# Patient Record
Sex: Male | Born: 1940
Health system: Southern US, Community
[De-identification: ages and names within clinical notes are randomized; demographics above are authoritative.]

## PROBLEM LIST (undated history)

## (undated) DIAGNOSIS — I4891 Unspecified atrial fibrillation: Secondary | ICD-10-CM

## (undated) DIAGNOSIS — M109 Gout, unspecified: Secondary | ICD-10-CM

## (undated) DIAGNOSIS — D751 Secondary polycythemia: Secondary | ICD-10-CM

## (undated) DIAGNOSIS — E039 Hypothyroidism, unspecified: Secondary | ICD-10-CM

## (undated) DIAGNOSIS — K219 Gastro-esophageal reflux disease without esophagitis: Secondary | ICD-10-CM

## (undated) DIAGNOSIS — I10 Essential (primary) hypertension: Secondary | ICD-10-CM

## (undated) DIAGNOSIS — M1990 Unspecified osteoarthritis, unspecified site: Secondary | ICD-10-CM

## (undated) DIAGNOSIS — I2510 Atherosclerotic heart disease of native coronary artery without angina pectoris: Secondary | ICD-10-CM

## (undated) DIAGNOSIS — E079 Disorder of thyroid, unspecified: Secondary | ICD-10-CM

## (undated) DIAGNOSIS — H269 Unspecified cataract: Secondary | ICD-10-CM

## (undated) DIAGNOSIS — G4730 Sleep apnea, unspecified: Secondary | ICD-10-CM

## (undated) DIAGNOSIS — J45909 Unspecified asthma, uncomplicated: Secondary | ICD-10-CM

## (undated) DIAGNOSIS — Z95810 Presence of automatic (implantable) cardiac defibrillator: Secondary | ICD-10-CM

## (undated) DIAGNOSIS — I509 Heart failure, unspecified: Secondary | ICD-10-CM

## (undated) DIAGNOSIS — R7303 Prediabetes: Secondary | ICD-10-CM

## (undated) DIAGNOSIS — K5790 Diverticulosis of intestine, part unspecified, without perforation or abscess without bleeding: Secondary | ICD-10-CM

## (undated) DIAGNOSIS — Z950 Presence of cardiac pacemaker: Secondary | ICD-10-CM

## (undated) DIAGNOSIS — E785 Hyperlipidemia, unspecified: Secondary | ICD-10-CM

## (undated) DIAGNOSIS — J449 Chronic obstructive pulmonary disease, unspecified: Secondary | ICD-10-CM

## (undated) DIAGNOSIS — D45 Polycythemia vera: Secondary | ICD-10-CM

## (undated) DIAGNOSIS — I447 Left bundle-branch block, unspecified: Secondary | ICD-10-CM

## (undated) HISTORY — DX: Diverticulosis of intestine, part unspecified, without perforation or abscess without bleeding: K57.90

## (undated) HISTORY — DX: Hyperlipidemia, unspecified: E78.5

## (undated) HISTORY — DX: Atherosclerotic heart disease of native coronary artery without angina pectoris: I25.10

## (undated) HISTORY — DX: Heart failure, unspecified: I50.9

## (undated) HISTORY — DX: Gastro-esophageal reflux disease without esophagitis: K21.9

## (undated) HISTORY — DX: Disorder of thyroid, unspecified: E07.9

## (undated) HISTORY — PX: TONSILLECTOMY: SUR1361

## (undated) HISTORY — DX: Unspecified atrial fibrillation: I48.91

## (undated) HISTORY — DX: Prediabetes: R73.03

## (undated) HISTORY — DX: Unspecified cataract: H26.9

## (undated) HISTORY — DX: Secondary polycythemia: D75.1

## (undated) HISTORY — DX: Polycythemia vera: D45

## (undated) HISTORY — DX: Unspecified asthma, uncomplicated: J45.909

## (undated) HISTORY — DX: Essential (primary) hypertension: I10

## (undated) HISTORY — PX: OTHER SURGICAL HISTORY: SHX169

## (undated) HISTORY — PX: CATARACT EXTRACTION W/ INTRAOCULAR LENS IMPLANT: SHX1309

## (undated) HISTORY — DX: Sleep apnea, unspecified: G47.30

## (undated) HISTORY — DX: Gout, unspecified: M10.9

## (undated) HISTORY — DX: Chronic obstructive pulmonary disease, unspecified: J44.9

---

## 2006-02-19 ENCOUNTER — Encounter: Admission: RE | Admit: 2006-02-19 | Discharge: 2006-02-19 | Payer: Self-pay | Admitting: Neurosurgery

## 2006-02-27 ENCOUNTER — Observation Stay (HOSPITAL_COMMUNITY): Admission: RE | Admit: 2006-02-27 | Discharge: 2006-02-27 | Payer: Self-pay | Admitting: Neurosurgery

## 2006-11-03 ENCOUNTER — Ambulatory Visit: Payer: Self-pay | Admitting: Gastroenterology

## 2006-11-11 ENCOUNTER — Ambulatory Visit: Payer: Self-pay | Admitting: Gastroenterology

## 2006-12-02 ENCOUNTER — Ambulatory Visit: Payer: Self-pay

## 2010-10-04 NOTE — Op Note (Signed)
[name] [name], [name] [name] NO.:  [id number]   MEDICAL RECORD NO.:  [id number]          PATIENT TYPE:  INP   LOCATION:  3017                         FACILITY:  [facility]   PHYSICIAN:  Donalee Citrin, M.D.        DATE OF BIRTH:  [birth year]   DATE OF PROCEDURE:  02/27/2006  DATE OF DISCHARGE:  02/27/2006                                 OPERATIVE REPORT   PREOPERATIVE DIAGNOSIS:  Right S1 radiculopathy from ruptured disk L5-S1,  right.   PROCEDURE:  Lumbar laminectomy, microdiskectomy, L5-S1 right with  microdissection dissection of the right S1 nerve root and microscopic  diskectomy.   SURGEON:  Donalee Citrin, M.D.   ASSISTANT:  Tia Alert, MD   ANESTHESIA:  General endotracheal.   HISTORY OF PRESENT ILLNESS:  Patient a very pleasant 70 year old gentleman  who has had previous lumbar surgery many, many years ago for left leg pain.  Presents with back and right leg pain radiating through his buttock down the  posterior thigh with occasional numbness on the outside of his foot and  bottom of his foot consistent with an S1 nerve root pattern.  The patient  underwent three transforaminal S1 epidural injections with symptomatic  relief from the first two but no response from the third.  The patient has  progressive worsening back and leg pain with leg pain with leg pain much  greater than back pain with preoperative imaging showing a ruptured disk at  L5-S1, right compressing the right S1 nerve root.  The patient was  recommended laminectomy and microdiskectomy.  The risks and benefits of the  operation were explained to the patient. He understands and agreed to  proceed forward.   DESCRIPTION OF PROCEDURE:  The patient was brought to the operating room  where he was induced under general anesthesia, placed prone on the Wilson  frame.  Back was prepped and draped in the usual sterile fashion.  Preoperative x-ray localized the L5-S1 disk space.  A portion of the central  aspect of his old incision was opened up. Scar tissue was dissected free and  subperiosteal dissection carried out on the right side at L5-S1.  After  intraoperative x-ray confirmed location at appropriate level, high speed  drill was used to drill the inferior aspect lamina L5, medial facet complex  and superior aspect of S1.  Then using a 2 and 3 mm Kerrison punch, the  laminectomy was underbitten.  The facet was underbitten exposing a  hypertrophied ligament.  The ligament was then underbitten exposing the  thecal sac.  At this point the operating microscope was draped brought into  the field and under microscopic illumination the S1 pedicle was identified  and the proximal S1 nerve root.  Due to the depth, some more additional  facet was undertaken to gain retraction on the S1 nerve root.  The ruptured  disk was immediately visualized upon retraction of the S1 nerve root with a  glistening membrane overlying on top of it. Using a blunt nerve hook working  underneath this membrane, several large fragments of disk were immediately  expressed.  Then with further mobilization of the S1 nerve root and  extension of the laminotomy inferiorly, severe more large fragments of very  large disk herniation were removed from the inferior aspect of the disk  space underneath the S1 nerve root.  After these free fragments were  removed, the disk space was incised, pituitary rongeurs were used to clean  out the disk space, several large fragments were removed from the central  compartment and compressed down with Epstein curette and pituitary rongeurs  to remove them.  At the end of diskectomy there was no further stenosis.  The central canal and S1 nerve roots were explored with hockey stick and  coronary dilator and had no further stenosis.  Wound was then copiously  irrigated and meticulous hemostasis was maintained.  Gelfoam was overlaid on top of the dura.  Muscle and fascia were  reapproximated  with interrupted Vicryls and the skin was closed with running  4-0 subcuticular benzoin and Steri-Strips applied.  The patient went to  recovery room in stable condition.  At the end of the case all needle and  sponge count correct.           ______________________________  Donalee Citrin, M.D.     GC/MEDQ  D:  02/27/2006  T:  03/02/2006  Job:  [id number]

## 2011-05-20 HISTORY — PX: LUMBAR FUSION: SHX111

## 2012-05-19 DIAGNOSIS — M109 Gout, unspecified: Secondary | ICD-10-CM

## 2012-05-19 HISTORY — DX: Gout, unspecified: M10.9

## 2013-05-19 DIAGNOSIS — I2510 Atherosclerotic heart disease of native coronary artery without angina pectoris: Secondary | ICD-10-CM

## 2013-05-19 HISTORY — DX: Atherosclerotic heart disease of native coronary artery without angina pectoris: I25.10

## 2013-05-26 DIAGNOSIS — R7309 Other abnormal glucose: Secondary | ICD-10-CM | POA: Diagnosis not present

## 2013-05-26 DIAGNOSIS — E559 Vitamin D deficiency, unspecified: Secondary | ICD-10-CM | POA: Diagnosis not present

## 2013-05-26 DIAGNOSIS — Z8249 Family history of ischemic heart disease and other diseases of the circulatory system: Secondary | ICD-10-CM | POA: Diagnosis not present

## 2013-05-26 DIAGNOSIS — I10 Essential (primary) hypertension: Secondary | ICD-10-CM | POA: Diagnosis not present

## 2013-05-26 DIAGNOSIS — I428 Other cardiomyopathies: Secondary | ICD-10-CM | POA: Diagnosis not present

## 2013-05-26 DIAGNOSIS — K625 Hemorrhage of anus and rectum: Secondary | ICD-10-CM | POA: Diagnosis not present

## 2013-05-26 DIAGNOSIS — D649 Anemia, unspecified: Secondary | ICD-10-CM | POA: Diagnosis not present

## 2013-05-26 DIAGNOSIS — N400 Enlarged prostate without lower urinary tract symptoms: Secondary | ICD-10-CM | POA: Diagnosis not present

## 2013-05-26 DIAGNOSIS — E785 Hyperlipidemia, unspecified: Secondary | ICD-10-CM | POA: Diagnosis not present

## 2013-05-26 DIAGNOSIS — M109 Gout, unspecified: Secondary | ICD-10-CM | POA: Diagnosis not present

## 2013-05-26 DIAGNOSIS — Z23 Encounter for immunization: Secondary | ICD-10-CM | POA: Diagnosis not present

## 2013-05-26 DIAGNOSIS — M5137 Other intervertebral disc degeneration, lumbosacral region: Secondary | ICD-10-CM | POA: Diagnosis not present

## 2013-05-26 DIAGNOSIS — I2510 Atherosclerotic heart disease of native coronary artery without angina pectoris: Secondary | ICD-10-CM | POA: Diagnosis not present

## 2013-05-26 DIAGNOSIS — Z125 Encounter for screening for malignant neoplasm of prostate: Secondary | ICD-10-CM | POA: Diagnosis not present

## 2013-05-26 DIAGNOSIS — Z0000 Encounter for general adult medical examination without abnormal findings: Secondary | ICD-10-CM | POA: Diagnosis not present

## 2013-05-26 DIAGNOSIS — I2589 Other forms of chronic ischemic heart disease: Secondary | ICD-10-CM | POA: Diagnosis not present

## 2013-05-26 DIAGNOSIS — I421 Obstructive hypertrophic cardiomyopathy: Secondary | ICD-10-CM | POA: Diagnosis not present

## 2013-05-31 DIAGNOSIS — H43819 Vitreous degeneration, unspecified eye: Secondary | ICD-10-CM | POA: Diagnosis not present

## 2013-05-31 DIAGNOSIS — H2510 Age-related nuclear cataract, unspecified eye: Secondary | ICD-10-CM | POA: Diagnosis not present

## 2013-06-02 DIAGNOSIS — M109 Gout, unspecified: Secondary | ICD-10-CM | POA: Diagnosis not present

## 2013-06-02 DIAGNOSIS — I421 Obstructive hypertrophic cardiomyopathy: Secondary | ICD-10-CM | POA: Diagnosis not present

## 2013-06-02 DIAGNOSIS — I10 Essential (primary) hypertension: Secondary | ICD-10-CM | POA: Diagnosis not present

## 2013-06-02 DIAGNOSIS — E785 Hyperlipidemia, unspecified: Secondary | ICD-10-CM | POA: Diagnosis not present

## 2013-06-13 DIAGNOSIS — K921 Melena: Secondary | ICD-10-CM | POA: Diagnosis not present

## 2013-06-22 DIAGNOSIS — J111 Influenza due to unidentified influenza virus with other respiratory manifestations: Secondary | ICD-10-CM | POA: Diagnosis not present

## 2013-07-11 DIAGNOSIS — G4733 Obstructive sleep apnea (adult) (pediatric): Secondary | ICD-10-CM | POA: Diagnosis not present

## 2013-07-11 DIAGNOSIS — K5730 Diverticulosis of large intestine without perforation or abscess without bleeding: Secondary | ICD-10-CM | POA: Diagnosis not present

## 2013-07-11 DIAGNOSIS — E039 Hypothyroidism, unspecified: Secondary | ICD-10-CM | POA: Diagnosis not present

## 2013-07-11 DIAGNOSIS — M109 Gout, unspecified: Secondary | ICD-10-CM | POA: Diagnosis not present

## 2013-07-11 DIAGNOSIS — E7800 Pure hypercholesterolemia, unspecified: Secondary | ICD-10-CM | POA: Diagnosis not present

## 2013-07-11 DIAGNOSIS — K2289 Other specified disease of esophagus: Secondary | ICD-10-CM | POA: Diagnosis not present

## 2013-07-11 DIAGNOSIS — I10 Essential (primary) hypertension: Secondary | ICD-10-CM | POA: Diagnosis not present

## 2013-07-11 DIAGNOSIS — K921 Melena: Secondary | ICD-10-CM | POA: Diagnosis not present

## 2013-07-11 DIAGNOSIS — K5641 Fecal impaction: Secondary | ICD-10-CM | POA: Diagnosis not present

## 2013-07-11 DIAGNOSIS — K2100 Gastro-esophageal reflux disease with esophagitis, without bleeding: Secondary | ICD-10-CM | POA: Diagnosis not present

## 2013-07-11 DIAGNOSIS — K228 Other specified diseases of esophagus: Secondary | ICD-10-CM | POA: Diagnosis not present

## 2013-07-11 DIAGNOSIS — K209 Esophagitis, unspecified without bleeding: Secondary | ICD-10-CM | POA: Diagnosis not present

## 2013-07-11 DIAGNOSIS — D130 Benign neoplasm of esophagus: Secondary | ICD-10-CM | POA: Diagnosis not present

## 2013-07-11 DIAGNOSIS — D126 Benign neoplasm of colon, unspecified: Secondary | ICD-10-CM | POA: Diagnosis not present

## 2013-07-11 DIAGNOSIS — D649 Anemia, unspecified: Secondary | ICD-10-CM | POA: Diagnosis not present

## 2013-07-11 DIAGNOSIS — R1310 Dysphagia, unspecified: Secondary | ICD-10-CM | POA: Diagnosis not present

## 2013-07-11 DIAGNOSIS — K222 Esophageal obstruction: Secondary | ICD-10-CM | POA: Diagnosis not present

## 2013-07-19 DIAGNOSIS — I428 Other cardiomyopathies: Secondary | ICD-10-CM | POA: Diagnosis not present

## 2013-09-08 DIAGNOSIS — I428 Other cardiomyopathies: Secondary | ICD-10-CM | POA: Diagnosis not present

## 2013-09-08 DIAGNOSIS — I10 Essential (primary) hypertension: Secondary | ICD-10-CM | POA: Diagnosis not present

## 2013-09-08 DIAGNOSIS — R7309 Other abnormal glucose: Secondary | ICD-10-CM | POA: Diagnosis not present

## 2013-09-08 DIAGNOSIS — E785 Hyperlipidemia, unspecified: Secondary | ICD-10-CM | POA: Diagnosis not present

## 2013-11-16 HISTORY — PX: CORONARY ARTERY BYPASS GRAFT: SHX141

## 2013-12-06 DIAGNOSIS — M539 Dorsopathy, unspecified: Secondary | ICD-10-CM | POA: Diagnosis not present

## 2013-12-06 DIAGNOSIS — M5137 Other intervertebral disc degeneration, lumbosacral region: Secondary | ICD-10-CM | POA: Diagnosis not present

## 2013-12-06 DIAGNOSIS — M545 Low back pain, unspecified: Secondary | ICD-10-CM | POA: Diagnosis not present

## 2013-12-06 DIAGNOSIS — Z981 Arthrodesis status: Secondary | ICD-10-CM | POA: Diagnosis not present

## 2013-12-07 DIAGNOSIS — M545 Low back pain, unspecified: Secondary | ICD-10-CM | POA: Diagnosis not present

## 2013-12-07 DIAGNOSIS — R7309 Other abnormal glucose: Secondary | ICD-10-CM | POA: Diagnosis not present

## 2013-12-07 DIAGNOSIS — E039 Hypothyroidism, unspecified: Secondary | ICD-10-CM | POA: Diagnosis not present

## 2013-12-12 DIAGNOSIS — Z0000 Encounter for general adult medical examination without abnormal findings: Secondary | ICD-10-CM | POA: Diagnosis not present

## 2013-12-12 DIAGNOSIS — R7309 Other abnormal glucose: Secondary | ICD-10-CM | POA: Diagnosis not present

## 2013-12-12 DIAGNOSIS — M25549 Pain in joints of unspecified hand: Secondary | ICD-10-CM | POA: Diagnosis not present

## 2013-12-13 DIAGNOSIS — M545 Low back pain, unspecified: Secondary | ICD-10-CM | POA: Diagnosis not present

## 2013-12-15 DIAGNOSIS — M545 Low back pain, unspecified: Secondary | ICD-10-CM | POA: Diagnosis not present

## 2013-12-16 DIAGNOSIS — H1010 Acute atopic conjunctivitis, unspecified eye: Secondary | ICD-10-CM | POA: Diagnosis not present

## 2013-12-22 DIAGNOSIS — M545 Low back pain, unspecified: Secondary | ICD-10-CM | POA: Diagnosis not present

## 2013-12-27 DIAGNOSIS — M545 Low back pain, unspecified: Secondary | ICD-10-CM | POA: Diagnosis not present

## 2013-12-29 DIAGNOSIS — K2100 Gastro-esophageal reflux disease with esophagitis, without bleeding: Secondary | ICD-10-CM | POA: Diagnosis not present

## 2013-12-29 DIAGNOSIS — R1312 Dysphagia, oropharyngeal phase: Secondary | ICD-10-CM | POA: Diagnosis not present

## 2013-12-29 DIAGNOSIS — J342 Deviated nasal septum: Secondary | ICD-10-CM | POA: Diagnosis not present

## 2013-12-30 DIAGNOSIS — M545 Low back pain, unspecified: Secondary | ICD-10-CM | POA: Diagnosis not present

## 2014-01-03 DIAGNOSIS — M545 Low back pain, unspecified: Secondary | ICD-10-CM | POA: Diagnosis not present

## 2014-01-05 DIAGNOSIS — M545 Low back pain, unspecified: Secondary | ICD-10-CM | POA: Diagnosis not present

## 2014-01-09 DIAGNOSIS — K219 Gastro-esophageal reflux disease without esophagitis: Secondary | ICD-10-CM | POA: Diagnosis not present

## 2014-01-09 DIAGNOSIS — R1310 Dysphagia, unspecified: Secondary | ICD-10-CM | POA: Diagnosis not present

## 2014-01-10 DIAGNOSIS — M545 Low back pain, unspecified: Secondary | ICD-10-CM | POA: Diagnosis not present

## 2014-01-13 DIAGNOSIS — M545 Low back pain, unspecified: Secondary | ICD-10-CM | POA: Diagnosis not present

## 2014-01-17 DIAGNOSIS — I428 Other cardiomyopathies: Secondary | ICD-10-CM | POA: Diagnosis not present

## 2014-01-17 DIAGNOSIS — I447 Left bundle-branch block, unspecified: Secondary | ICD-10-CM | POA: Diagnosis not present

## 2014-01-17 DIAGNOSIS — R069 Unspecified abnormalities of breathing: Secondary | ICD-10-CM | POA: Diagnosis not present

## 2014-01-17 DIAGNOSIS — R079 Chest pain, unspecified: Secondary | ICD-10-CM | POA: Diagnosis not present

## 2014-01-17 DIAGNOSIS — M545 Low back pain, unspecified: Secondary | ICD-10-CM | POA: Diagnosis not present

## 2014-01-17 DIAGNOSIS — I4891 Unspecified atrial fibrillation: Secondary | ICD-10-CM

## 2014-01-17 DIAGNOSIS — R7303 Prediabetes: Secondary | ICD-10-CM

## 2014-01-17 HISTORY — DX: Prediabetes: R73.03

## 2014-01-17 HISTORY — DX: Unspecified atrial fibrillation: I48.91

## 2014-01-19 DIAGNOSIS — M5137 Other intervertebral disc degeneration, lumbosacral region: Secondary | ICD-10-CM | POA: Diagnosis not present

## 2014-01-19 DIAGNOSIS — I499 Cardiac arrhythmia, unspecified: Secondary | ICD-10-CM | POA: Diagnosis not present

## 2014-01-20 DIAGNOSIS — R0609 Other forms of dyspnea: Secondary | ICD-10-CM | POA: Diagnosis not present

## 2014-01-20 DIAGNOSIS — I428 Other cardiomyopathies: Secondary | ICD-10-CM | POA: Diagnosis not present

## 2014-01-20 DIAGNOSIS — R079 Chest pain, unspecified: Secondary | ICD-10-CM | POA: Diagnosis not present

## 2014-01-20 DIAGNOSIS — I447 Left bundle-branch block, unspecified: Secondary | ICD-10-CM | POA: Diagnosis not present

## 2014-01-20 DIAGNOSIS — R0989 Other specified symptoms and signs involving the circulatory and respiratory systems: Secondary | ICD-10-CM | POA: Diagnosis not present

## 2014-01-24 DIAGNOSIS — R079 Chest pain, unspecified: Secondary | ICD-10-CM | POA: Diagnosis not present

## 2014-01-24 DIAGNOSIS — R0609 Other forms of dyspnea: Secondary | ICD-10-CM | POA: Diagnosis not present

## 2014-01-24 DIAGNOSIS — I447 Left bundle-branch block, unspecified: Secondary | ICD-10-CM | POA: Diagnosis not present

## 2014-01-24 DIAGNOSIS — M545 Low back pain, unspecified: Secondary | ICD-10-CM | POA: Diagnosis not present

## 2014-01-24 DIAGNOSIS — R0989 Other specified symptoms and signs involving the circulatory and respiratory systems: Secondary | ICD-10-CM | POA: Diagnosis not present

## 2014-01-26 DIAGNOSIS — M545 Low back pain, unspecified: Secondary | ICD-10-CM | POA: Diagnosis not present

## 2014-01-31 DIAGNOSIS — I2510 Atherosclerotic heart disease of native coronary artery without angina pectoris: Secondary | ICD-10-CM | POA: Diagnosis not present

## 2014-02-01 DIAGNOSIS — I2510 Atherosclerotic heart disease of native coronary artery without angina pectoris: Secondary | ICD-10-CM | POA: Diagnosis not present

## 2014-02-01 DIAGNOSIS — M545 Low back pain, unspecified: Secondary | ICD-10-CM | POA: Diagnosis not present

## 2014-02-03 DIAGNOSIS — M545 Low back pain, unspecified: Secondary | ICD-10-CM | POA: Diagnosis not present

## 2014-02-06 DIAGNOSIS — I2510 Atherosclerotic heart disease of native coronary artery without angina pectoris: Secondary | ICD-10-CM | POA: Diagnosis not present

## 2014-02-06 DIAGNOSIS — I359 Nonrheumatic aortic valve disorder, unspecified: Secondary | ICD-10-CM | POA: Diagnosis not present

## 2014-02-06 DIAGNOSIS — I200 Unstable angina: Secondary | ICD-10-CM | POA: Diagnosis not present

## 2014-02-06 DIAGNOSIS — I519 Heart disease, unspecified: Secondary | ICD-10-CM | POA: Diagnosis not present

## 2014-02-06 DIAGNOSIS — I279 Pulmonary heart disease, unspecified: Secondary | ICD-10-CM | POA: Diagnosis not present

## 2014-02-06 DIAGNOSIS — Z87891 Personal history of nicotine dependence: Secondary | ICD-10-CM | POA: Diagnosis not present

## 2014-02-06 DIAGNOSIS — Q251 Coarctation of aorta: Secondary | ICD-10-CM | POA: Diagnosis not present

## 2014-02-06 DIAGNOSIS — Z79899 Other long term (current) drug therapy: Secondary | ICD-10-CM | POA: Diagnosis not present

## 2014-02-06 DIAGNOSIS — Q2529 Other atresia of aorta: Secondary | ICD-10-CM | POA: Diagnosis not present

## 2014-02-06 DIAGNOSIS — R9439 Abnormal result of other cardiovascular function study: Secondary | ICD-10-CM | POA: Diagnosis not present

## 2014-02-06 DIAGNOSIS — R0789 Other chest pain: Secondary | ICD-10-CM | POA: Diagnosis not present

## 2014-02-06 DIAGNOSIS — Z7982 Long term (current) use of aspirin: Secondary | ICD-10-CM | POA: Diagnosis not present

## 2014-02-06 DIAGNOSIS — I2789 Other specified pulmonary heart diseases: Secondary | ICD-10-CM | POA: Diagnosis not present

## 2014-02-06 DIAGNOSIS — I428 Other cardiomyopathies: Secondary | ICD-10-CM | POA: Diagnosis not present

## 2014-02-06 DIAGNOSIS — I2722 Pulmonary hypertension due to left heart disease: Secondary | ICD-10-CM | POA: Insufficient documentation

## 2014-02-08 DIAGNOSIS — I2510 Atherosclerotic heart disease of native coronary artery without angina pectoris: Secondary | ICD-10-CM | POA: Diagnosis not present

## 2014-02-08 DIAGNOSIS — I429 Cardiomyopathy, unspecified: Secondary | ICD-10-CM | POA: Insufficient documentation

## 2014-02-08 DIAGNOSIS — N390 Urinary tract infection, site not specified: Secondary | ICD-10-CM | POA: Diagnosis not present

## 2014-02-08 DIAGNOSIS — Z01810 Encounter for preprocedural cardiovascular examination: Secondary | ICD-10-CM | POA: Diagnosis not present

## 2014-02-08 DIAGNOSIS — Z7901 Long term (current) use of anticoagulants: Secondary | ICD-10-CM | POA: Diagnosis not present

## 2014-02-08 DIAGNOSIS — I10 Essential (primary) hypertension: Secondary | ICD-10-CM | POA: Diagnosis not present

## 2014-02-08 DIAGNOSIS — E079 Disorder of thyroid, unspecified: Secondary | ICD-10-CM | POA: Diagnosis not present

## 2014-02-08 DIAGNOSIS — Z01818 Encounter for other preprocedural examination: Secondary | ICD-10-CM | POA: Diagnosis not present

## 2014-02-08 DIAGNOSIS — M109 Gout, unspecified: Secondary | ICD-10-CM | POA: Insufficient documentation

## 2014-02-09 DIAGNOSIS — I10 Essential (primary) hypertension: Secondary | ICD-10-CM | POA: Diagnosis not present

## 2014-02-13 DIAGNOSIS — Z87891 Personal history of nicotine dependence: Secondary | ICD-10-CM | POA: Diagnosis not present

## 2014-02-13 DIAGNOSIS — R0602 Shortness of breath: Secondary | ICD-10-CM | POA: Diagnosis not present

## 2014-02-13 DIAGNOSIS — B961 Klebsiella pneumoniae [K. pneumoniae] as the cause of diseases classified elsewhere: Secondary | ICD-10-CM | POA: Diagnosis not present

## 2014-02-13 DIAGNOSIS — I359 Nonrheumatic aortic valve disorder, unspecified: Secondary | ICD-10-CM | POA: Diagnosis not present

## 2014-02-13 DIAGNOSIS — M109 Gout, unspecified: Secondary | ICD-10-CM | POA: Diagnosis present

## 2014-02-13 DIAGNOSIS — E871 Hypo-osmolality and hyponatremia: Secondary | ICD-10-CM | POA: Diagnosis not present

## 2014-02-13 DIAGNOSIS — Z7982 Long term (current) use of aspirin: Secondary | ICD-10-CM | POA: Diagnosis not present

## 2014-02-13 DIAGNOSIS — R1310 Dysphagia, unspecified: Secondary | ICD-10-CM | POA: Diagnosis present

## 2014-02-13 DIAGNOSIS — N390 Urinary tract infection, site not specified: Secondary | ICD-10-CM | POA: Diagnosis not present

## 2014-02-13 DIAGNOSIS — I517 Cardiomegaly: Secondary | ICD-10-CM | POA: Diagnosis not present

## 2014-02-13 DIAGNOSIS — I350 Nonrheumatic aortic (valve) stenosis: Secondary | ICD-10-CM | POA: Diagnosis present

## 2014-02-13 DIAGNOSIS — I499 Cardiac arrhythmia, unspecified: Secondary | ICD-10-CM | POA: Diagnosis not present

## 2014-02-13 DIAGNOSIS — I272 Other secondary pulmonary hypertension: Secondary | ICD-10-CM | POA: Diagnosis present

## 2014-02-13 DIAGNOSIS — I10 Essential (primary) hypertension: Secondary | ICD-10-CM | POA: Diagnosis present

## 2014-02-13 DIAGNOSIS — I5022 Chronic systolic (congestive) heart failure: Secondary | ICD-10-CM | POA: Diagnosis present

## 2014-02-13 DIAGNOSIS — J9819 Other pulmonary collapse: Secondary | ICD-10-CM | POA: Diagnosis not present

## 2014-02-13 DIAGNOSIS — Z6841 Body Mass Index (BMI) 40.0 and over, adult: Secondary | ICD-10-CM | POA: Diagnosis not present

## 2014-02-13 DIAGNOSIS — Z4682 Encounter for fitting and adjustment of non-vascular catheter: Secondary | ICD-10-CM | POA: Diagnosis not present

## 2014-02-13 DIAGNOSIS — I447 Left bundle-branch block, unspecified: Secondary | ICD-10-CM | POA: Diagnosis not present

## 2014-02-13 DIAGNOSIS — I509 Heart failure, unspecified: Secondary | ICD-10-CM | POA: Diagnosis not present

## 2014-02-13 DIAGNOSIS — R918 Other nonspecific abnormal finding of lung field: Secondary | ICD-10-CM | POA: Diagnosis not present

## 2014-02-13 DIAGNOSIS — J811 Chronic pulmonary edema: Secondary | ICD-10-CM | POA: Diagnosis not present

## 2014-02-13 DIAGNOSIS — Z981 Arthrodesis status: Secondary | ICD-10-CM | POA: Diagnosis not present

## 2014-02-13 DIAGNOSIS — D62 Acute posthemorrhagic anemia: Secondary | ICD-10-CM | POA: Diagnosis not present

## 2014-02-13 DIAGNOSIS — G4730 Sleep apnea, unspecified: Secondary | ICD-10-CM | POA: Diagnosis present

## 2014-02-13 DIAGNOSIS — N400 Enlarged prostate without lower urinary tract symptoms: Secondary | ICD-10-CM | POA: Diagnosis present

## 2014-02-13 DIAGNOSIS — E039 Hypothyroidism, unspecified: Secondary | ICD-10-CM | POA: Diagnosis present

## 2014-02-13 DIAGNOSIS — I429 Cardiomyopathy, unspecified: Secondary | ICD-10-CM | POA: Diagnosis present

## 2014-02-13 DIAGNOSIS — J90 Pleural effusion, not elsewhere classified: Secondary | ICD-10-CM | POA: Diagnosis not present

## 2014-02-13 DIAGNOSIS — Z951 Presence of aortocoronary bypass graft: Secondary | ICD-10-CM | POA: Insufficient documentation

## 2014-02-13 DIAGNOSIS — K219 Gastro-esophageal reflux disease without esophagitis: Secondary | ICD-10-CM | POA: Diagnosis present

## 2014-02-13 DIAGNOSIS — D6959 Other secondary thrombocytopenia: Secondary | ICD-10-CM | POA: Diagnosis not present

## 2014-02-13 DIAGNOSIS — J984 Other disorders of lung: Secondary | ICD-10-CM | POA: Diagnosis not present

## 2014-02-13 DIAGNOSIS — Z0389 Encounter for observation for other suspected diseases and conditions ruled out: Secondary | ICD-10-CM | POA: Diagnosis not present

## 2014-02-13 DIAGNOSIS — E785 Hyperlipidemia, unspecified: Secondary | ICD-10-CM | POA: Diagnosis present

## 2014-02-13 DIAGNOSIS — I2510 Atherosclerotic heart disease of native coronary artery without angina pectoris: Secondary | ICD-10-CM | POA: Diagnosis not present

## 2014-02-17 DIAGNOSIS — I5022 Chronic systolic (congestive) heart failure: Secondary | ICD-10-CM | POA: Insufficient documentation

## 2014-02-23 DIAGNOSIS — M109 Gout, unspecified: Secondary | ICD-10-CM | POA: Diagnosis not present

## 2014-02-23 DIAGNOSIS — N400 Enlarged prostate without lower urinary tract symptoms: Secondary | ICD-10-CM | POA: Diagnosis not present

## 2014-02-23 DIAGNOSIS — E785 Hyperlipidemia, unspecified: Secondary | ICD-10-CM | POA: Diagnosis not present

## 2014-02-23 DIAGNOSIS — I270 Primary pulmonary hypertension: Secondary | ICD-10-CM | POA: Diagnosis not present

## 2014-02-23 DIAGNOSIS — Z48812 Encounter for surgical aftercare following surgery on the circulatory system: Secondary | ICD-10-CM | POA: Diagnosis not present

## 2014-02-23 DIAGNOSIS — I447 Left bundle-branch block, unspecified: Secondary | ICD-10-CM | POA: Diagnosis not present

## 2014-02-23 DIAGNOSIS — E871 Hypo-osmolality and hyponatremia: Secondary | ICD-10-CM | POA: Diagnosis not present

## 2014-02-23 DIAGNOSIS — I5022 Chronic systolic (congestive) heart failure: Secondary | ICD-10-CM | POA: Diagnosis not present

## 2014-02-23 DIAGNOSIS — R1310 Dysphagia, unspecified: Secondary | ICD-10-CM | POA: Diagnosis not present

## 2014-02-23 DIAGNOSIS — I10 Essential (primary) hypertension: Secondary | ICD-10-CM | POA: Diagnosis not present

## 2014-02-23 DIAGNOSIS — I429 Cardiomyopathy, unspecified: Secondary | ICD-10-CM | POA: Diagnosis not present

## 2014-02-23 DIAGNOSIS — E039 Hypothyroidism, unspecified: Secondary | ICD-10-CM | POA: Diagnosis not present

## 2014-02-23 DIAGNOSIS — G4730 Sleep apnea, unspecified: Secondary | ICD-10-CM | POA: Diagnosis not present

## 2014-02-23 DIAGNOSIS — K219 Gastro-esophageal reflux disease without esophagitis: Secondary | ICD-10-CM | POA: Diagnosis not present

## 2014-02-23 DIAGNOSIS — I2510 Atherosclerotic heart disease of native coronary artery without angina pectoris: Secondary | ICD-10-CM | POA: Diagnosis not present

## 2014-02-25 DIAGNOSIS — R1310 Dysphagia, unspecified: Secondary | ICD-10-CM | POA: Diagnosis not present

## 2014-02-25 DIAGNOSIS — I447 Left bundle-branch block, unspecified: Secondary | ICD-10-CM | POA: Diagnosis not present

## 2014-02-25 DIAGNOSIS — I429 Cardiomyopathy, unspecified: Secondary | ICD-10-CM | POA: Diagnosis not present

## 2014-02-25 DIAGNOSIS — I5022 Chronic systolic (congestive) heart failure: Secondary | ICD-10-CM | POA: Diagnosis not present

## 2014-02-25 DIAGNOSIS — Z48812 Encounter for surgical aftercare following surgery on the circulatory system: Secondary | ICD-10-CM | POA: Diagnosis not present

## 2014-02-25 DIAGNOSIS — I2510 Atherosclerotic heart disease of native coronary artery without angina pectoris: Secondary | ICD-10-CM | POA: Diagnosis not present

## 2014-02-28 DIAGNOSIS — I429 Cardiomyopathy, unspecified: Secondary | ICD-10-CM | POA: Diagnosis not present

## 2014-02-28 DIAGNOSIS — I447 Left bundle-branch block, unspecified: Secondary | ICD-10-CM | POA: Diagnosis not present

## 2014-02-28 DIAGNOSIS — R1310 Dysphagia, unspecified: Secondary | ICD-10-CM | POA: Diagnosis not present

## 2014-02-28 DIAGNOSIS — I5022 Chronic systolic (congestive) heart failure: Secondary | ICD-10-CM | POA: Diagnosis not present

## 2014-02-28 DIAGNOSIS — Z48812 Encounter for surgical aftercare following surgery on the circulatory system: Secondary | ICD-10-CM | POA: Diagnosis not present

## 2014-02-28 DIAGNOSIS — I2510 Atherosclerotic heart disease of native coronary artery without angina pectoris: Secondary | ICD-10-CM | POA: Diagnosis not present

## 2014-03-01 DIAGNOSIS — I447 Left bundle-branch block, unspecified: Secondary | ICD-10-CM | POA: Diagnosis not present

## 2014-03-01 DIAGNOSIS — Z48812 Encounter for surgical aftercare following surgery on the circulatory system: Secondary | ICD-10-CM | POA: Diagnosis not present

## 2014-03-01 DIAGNOSIS — R1310 Dysphagia, unspecified: Secondary | ICD-10-CM | POA: Diagnosis not present

## 2014-03-01 DIAGNOSIS — I2510 Atherosclerotic heart disease of native coronary artery without angina pectoris: Secondary | ICD-10-CM | POA: Diagnosis not present

## 2014-03-01 DIAGNOSIS — I5022 Chronic systolic (congestive) heart failure: Secondary | ICD-10-CM | POA: Diagnosis not present

## 2014-03-01 DIAGNOSIS — I429 Cardiomyopathy, unspecified: Secondary | ICD-10-CM | POA: Diagnosis not present

## 2014-03-02 DIAGNOSIS — I429 Cardiomyopathy, unspecified: Secondary | ICD-10-CM | POA: Diagnosis not present

## 2014-03-02 DIAGNOSIS — I447 Left bundle-branch block, unspecified: Secondary | ICD-10-CM | POA: Diagnosis not present

## 2014-03-02 DIAGNOSIS — I5022 Chronic systolic (congestive) heart failure: Secondary | ICD-10-CM | POA: Diagnosis not present

## 2014-03-02 DIAGNOSIS — Z48812 Encounter for surgical aftercare following surgery on the circulatory system: Secondary | ICD-10-CM | POA: Diagnosis not present

## 2014-03-02 DIAGNOSIS — R1310 Dysphagia, unspecified: Secondary | ICD-10-CM | POA: Diagnosis not present

## 2014-03-02 DIAGNOSIS — I2510 Atherosclerotic heart disease of native coronary artery without angina pectoris: Secondary | ICD-10-CM | POA: Diagnosis not present

## 2014-03-06 DIAGNOSIS — I429 Cardiomyopathy, unspecified: Secondary | ICD-10-CM | POA: Diagnosis not present

## 2014-03-06 DIAGNOSIS — R1310 Dysphagia, unspecified: Secondary | ICD-10-CM | POA: Diagnosis not present

## 2014-03-06 DIAGNOSIS — I5022 Chronic systolic (congestive) heart failure: Secondary | ICD-10-CM | POA: Diagnosis not present

## 2014-03-06 DIAGNOSIS — I2510 Atherosclerotic heart disease of native coronary artery without angina pectoris: Secondary | ICD-10-CM | POA: Diagnosis not present

## 2014-03-06 DIAGNOSIS — Z48812 Encounter for surgical aftercare following surgery on the circulatory system: Secondary | ICD-10-CM | POA: Diagnosis not present

## 2014-03-06 DIAGNOSIS — I447 Left bundle-branch block, unspecified: Secondary | ICD-10-CM | POA: Diagnosis not present

## 2014-03-07 DIAGNOSIS — M109 Gout, unspecified: Secondary | ICD-10-CM | POA: Diagnosis not present

## 2014-03-07 DIAGNOSIS — E785 Hyperlipidemia, unspecified: Secondary | ICD-10-CM | POA: Diagnosis not present

## 2014-03-07 DIAGNOSIS — K219 Gastro-esophageal reflux disease without esophagitis: Secondary | ICD-10-CM | POA: Diagnosis not present

## 2014-03-07 DIAGNOSIS — J439 Emphysema, unspecified: Secondary | ICD-10-CM | POA: Diagnosis not present

## 2014-03-07 DIAGNOSIS — M5136 Other intervertebral disc degeneration, lumbar region: Secondary | ICD-10-CM | POA: Diagnosis not present

## 2014-03-07 DIAGNOSIS — Z23 Encounter for immunization: Secondary | ICD-10-CM | POA: Diagnosis not present

## 2014-03-07 DIAGNOSIS — N400 Enlarged prostate without lower urinary tract symptoms: Secondary | ICD-10-CM | POA: Diagnosis not present

## 2014-03-07 DIAGNOSIS — I2510 Atherosclerotic heart disease of native coronary artery without angina pectoris: Secondary | ICD-10-CM | POA: Diagnosis not present

## 2014-03-08 DIAGNOSIS — L821 Other seborrheic keratosis: Secondary | ICD-10-CM | POA: Diagnosis not present

## 2014-03-09 DIAGNOSIS — I5022 Chronic systolic (congestive) heart failure: Secondary | ICD-10-CM | POA: Diagnosis not present

## 2014-03-09 DIAGNOSIS — I2510 Atherosclerotic heart disease of native coronary artery without angina pectoris: Secondary | ICD-10-CM | POA: Diagnosis not present

## 2014-03-09 DIAGNOSIS — R1310 Dysphagia, unspecified: Secondary | ICD-10-CM | POA: Diagnosis not present

## 2014-03-09 DIAGNOSIS — I429 Cardiomyopathy, unspecified: Secondary | ICD-10-CM | POA: Diagnosis not present

## 2014-03-09 DIAGNOSIS — I447 Left bundle-branch block, unspecified: Secondary | ICD-10-CM | POA: Diagnosis not present

## 2014-03-09 DIAGNOSIS — Z48812 Encounter for surgical aftercare following surgery on the circulatory system: Secondary | ICD-10-CM | POA: Diagnosis not present

## 2014-03-10 DIAGNOSIS — E785 Hyperlipidemia, unspecified: Secondary | ICD-10-CM | POA: Diagnosis not present

## 2014-03-10 DIAGNOSIS — I25810 Atherosclerosis of coronary artery bypass graft(s) without angina pectoris: Secondary | ICD-10-CM | POA: Diagnosis not present

## 2014-03-10 DIAGNOSIS — I447 Left bundle-branch block, unspecified: Secondary | ICD-10-CM | POA: Diagnosis not present

## 2014-03-13 DIAGNOSIS — R1310 Dysphagia, unspecified: Secondary | ICD-10-CM | POA: Diagnosis not present

## 2014-03-13 DIAGNOSIS — I5022 Chronic systolic (congestive) heart failure: Secondary | ICD-10-CM | POA: Diagnosis not present

## 2014-03-13 DIAGNOSIS — I2510 Atherosclerotic heart disease of native coronary artery without angina pectoris: Secondary | ICD-10-CM | POA: Diagnosis not present

## 2014-03-13 DIAGNOSIS — I429 Cardiomyopathy, unspecified: Secondary | ICD-10-CM | POA: Diagnosis not present

## 2014-03-13 DIAGNOSIS — I447 Left bundle-branch block, unspecified: Secondary | ICD-10-CM | POA: Diagnosis not present

## 2014-03-13 DIAGNOSIS — Z48812 Encounter for surgical aftercare following surgery on the circulatory system: Secondary | ICD-10-CM | POA: Diagnosis not present

## 2014-03-14 DIAGNOSIS — I5022 Chronic systolic (congestive) heart failure: Secondary | ICD-10-CM | POA: Diagnosis not present

## 2014-03-14 DIAGNOSIS — I2510 Atherosclerotic heart disease of native coronary artery without angina pectoris: Secondary | ICD-10-CM | POA: Diagnosis not present

## 2014-03-14 DIAGNOSIS — R1310 Dysphagia, unspecified: Secondary | ICD-10-CM | POA: Diagnosis not present

## 2014-03-14 DIAGNOSIS — I447 Left bundle-branch block, unspecified: Secondary | ICD-10-CM | POA: Diagnosis not present

## 2014-03-14 DIAGNOSIS — I429 Cardiomyopathy, unspecified: Secondary | ICD-10-CM | POA: Diagnosis not present

## 2014-03-14 DIAGNOSIS — Z48812 Encounter for surgical aftercare following surgery on the circulatory system: Secondary | ICD-10-CM | POA: Diagnosis not present

## 2014-03-16 DIAGNOSIS — Z48812 Encounter for surgical aftercare following surgery on the circulatory system: Secondary | ICD-10-CM | POA: Diagnosis not present

## 2014-03-16 DIAGNOSIS — I5022 Chronic systolic (congestive) heart failure: Secondary | ICD-10-CM | POA: Diagnosis not present

## 2014-03-16 DIAGNOSIS — R1310 Dysphagia, unspecified: Secondary | ICD-10-CM | POA: Diagnosis not present

## 2014-03-16 DIAGNOSIS — I2510 Atherosclerotic heart disease of native coronary artery without angina pectoris: Secondary | ICD-10-CM | POA: Diagnosis not present

## 2014-03-16 DIAGNOSIS — I429 Cardiomyopathy, unspecified: Secondary | ICD-10-CM | POA: Diagnosis not present

## 2014-03-16 DIAGNOSIS — I447 Left bundle-branch block, unspecified: Secondary | ICD-10-CM | POA: Diagnosis not present

## 2014-03-17 DIAGNOSIS — Z48812 Encounter for surgical aftercare following surgery on the circulatory system: Secondary | ICD-10-CM | POA: Diagnosis not present

## 2014-03-17 DIAGNOSIS — I447 Left bundle-branch block, unspecified: Secondary | ICD-10-CM | POA: Diagnosis not present

## 2014-03-17 DIAGNOSIS — I5022 Chronic systolic (congestive) heart failure: Secondary | ICD-10-CM | POA: Diagnosis not present

## 2014-03-17 DIAGNOSIS — I429 Cardiomyopathy, unspecified: Secondary | ICD-10-CM | POA: Diagnosis not present

## 2014-03-17 DIAGNOSIS — I2510 Atherosclerotic heart disease of native coronary artery without angina pectoris: Secondary | ICD-10-CM | POA: Diagnosis not present

## 2014-03-17 DIAGNOSIS — R1310 Dysphagia, unspecified: Secondary | ICD-10-CM | POA: Diagnosis not present

## 2014-03-21 DIAGNOSIS — I429 Cardiomyopathy, unspecified: Secondary | ICD-10-CM | POA: Diagnosis not present

## 2014-03-21 DIAGNOSIS — Z48812 Encounter for surgical aftercare following surgery on the circulatory system: Secondary | ICD-10-CM | POA: Diagnosis not present

## 2014-03-21 DIAGNOSIS — R1310 Dysphagia, unspecified: Secondary | ICD-10-CM | POA: Diagnosis not present

## 2014-03-21 DIAGNOSIS — I2510 Atherosclerotic heart disease of native coronary artery without angina pectoris: Secondary | ICD-10-CM | POA: Diagnosis not present

## 2014-03-21 DIAGNOSIS — I447 Left bundle-branch block, unspecified: Secondary | ICD-10-CM | POA: Diagnosis not present

## 2014-03-21 DIAGNOSIS — I5022 Chronic systolic (congestive) heart failure: Secondary | ICD-10-CM | POA: Diagnosis not present

## 2014-03-24 DIAGNOSIS — I2510 Atherosclerotic heart disease of native coronary artery without angina pectoris: Secondary | ICD-10-CM | POA: Diagnosis not present

## 2014-03-24 DIAGNOSIS — I499 Cardiac arrhythmia, unspecified: Secondary | ICD-10-CM | POA: Diagnosis not present

## 2014-03-24 DIAGNOSIS — I509 Heart failure, unspecified: Secondary | ICD-10-CM | POA: Diagnosis not present

## 2014-03-24 DIAGNOSIS — I421 Obstructive hypertrophic cardiomyopathy: Secondary | ICD-10-CM | POA: Diagnosis not present

## 2014-03-24 DIAGNOSIS — I429 Cardiomyopathy, unspecified: Secondary | ICD-10-CM | POA: Diagnosis not present

## 2014-03-24 DIAGNOSIS — I5022 Chronic systolic (congestive) heart failure: Secondary | ICD-10-CM | POA: Diagnosis not present

## 2014-03-24 DIAGNOSIS — I447 Left bundle-branch block, unspecified: Secondary | ICD-10-CM | POA: Diagnosis not present

## 2014-03-24 DIAGNOSIS — I10 Essential (primary) hypertension: Secondary | ICD-10-CM | POA: Diagnosis not present

## 2014-03-24 DIAGNOSIS — R1310 Dysphagia, unspecified: Secondary | ICD-10-CM | POA: Diagnosis not present

## 2014-03-24 DIAGNOSIS — R7309 Other abnormal glucose: Secondary | ICD-10-CM | POA: Diagnosis not present

## 2014-03-24 DIAGNOSIS — E785 Hyperlipidemia, unspecified: Secondary | ICD-10-CM | POA: Diagnosis not present

## 2014-03-24 DIAGNOSIS — Z48812 Encounter for surgical aftercare following surgery on the circulatory system: Secondary | ICD-10-CM | POA: Diagnosis not present

## 2014-03-31 DIAGNOSIS — M7989 Other specified soft tissue disorders: Secondary | ICD-10-CM | POA: Diagnosis not present

## 2014-03-31 DIAGNOSIS — S83241A Other tear of medial meniscus, current injury, right knee, initial encounter: Secondary | ICD-10-CM | POA: Diagnosis not present

## 2014-04-05 DIAGNOSIS — I5022 Chronic systolic (congestive) heart failure: Secondary | ICD-10-CM | POA: Diagnosis not present

## 2014-04-05 DIAGNOSIS — R1310 Dysphagia, unspecified: Secondary | ICD-10-CM | POA: Diagnosis not present

## 2014-04-05 DIAGNOSIS — I447 Left bundle-branch block, unspecified: Secondary | ICD-10-CM | POA: Diagnosis not present

## 2014-04-05 DIAGNOSIS — Z48812 Encounter for surgical aftercare following surgery on the circulatory system: Secondary | ICD-10-CM | POA: Diagnosis not present

## 2014-04-05 DIAGNOSIS — I2510 Atherosclerotic heart disease of native coronary artery without angina pectoris: Secondary | ICD-10-CM | POA: Diagnosis not present

## 2014-04-05 DIAGNOSIS — I429 Cardiomyopathy, unspecified: Secondary | ICD-10-CM | POA: Diagnosis not present

## 2014-04-10 DIAGNOSIS — Z48812 Encounter for surgical aftercare following surgery on the circulatory system: Secondary | ICD-10-CM | POA: Diagnosis not present

## 2014-04-10 DIAGNOSIS — R1310 Dysphagia, unspecified: Secondary | ICD-10-CM | POA: Diagnosis not present

## 2014-04-10 DIAGNOSIS — I5022 Chronic systolic (congestive) heart failure: Secondary | ICD-10-CM | POA: Diagnosis not present

## 2014-04-10 DIAGNOSIS — I429 Cardiomyopathy, unspecified: Secondary | ICD-10-CM | POA: Diagnosis not present

## 2014-04-10 DIAGNOSIS — I2510 Atherosclerotic heart disease of native coronary artery without angina pectoris: Secondary | ICD-10-CM | POA: Diagnosis not present

## 2014-04-10 DIAGNOSIS — I447 Left bundle-branch block, unspecified: Secondary | ICD-10-CM | POA: Diagnosis not present

## 2014-04-21 DIAGNOSIS — R079 Chest pain, unspecified: Secondary | ICD-10-CM | POA: Diagnosis not present

## 2014-04-21 DIAGNOSIS — I259 Chronic ischemic heart disease, unspecified: Secondary | ICD-10-CM | POA: Diagnosis not present

## 2014-04-21 DIAGNOSIS — E785 Hyperlipidemia, unspecified: Secondary | ICD-10-CM | POA: Diagnosis not present

## 2014-05-03 DIAGNOSIS — I2510 Atherosclerotic heart disease of native coronary artery without angina pectoris: Secondary | ICD-10-CM | POA: Diagnosis not present

## 2014-05-03 DIAGNOSIS — E785 Hyperlipidemia, unspecified: Secondary | ICD-10-CM | POA: Diagnosis not present

## 2014-05-03 DIAGNOSIS — R079 Chest pain, unspecified: Secondary | ICD-10-CM | POA: Diagnosis not present

## 2014-05-05 DIAGNOSIS — R079 Chest pain, unspecified: Secondary | ICD-10-CM | POA: Diagnosis not present

## 2014-05-05 DIAGNOSIS — E785 Hyperlipidemia, unspecified: Secondary | ICD-10-CM | POA: Diagnosis not present

## 2014-05-05 DIAGNOSIS — I2510 Atherosclerotic heart disease of native coronary artery without angina pectoris: Secondary | ICD-10-CM | POA: Diagnosis not present

## 2014-05-23 DIAGNOSIS — E6609 Other obesity due to excess calories: Secondary | ICD-10-CM | POA: Diagnosis not present

## 2014-05-23 DIAGNOSIS — N400 Enlarged prostate without lower urinary tract symptoms: Secondary | ICD-10-CM | POA: Diagnosis not present

## 2014-05-23 DIAGNOSIS — I429 Cardiomyopathy, unspecified: Secondary | ICD-10-CM | POA: Diagnosis not present

## 2014-05-23 DIAGNOSIS — M159 Polyosteoarthritis, unspecified: Secondary | ICD-10-CM | POA: Diagnosis not present

## 2014-05-23 DIAGNOSIS — I10 Essential (primary) hypertension: Secondary | ICD-10-CM | POA: Diagnosis not present

## 2014-05-23 DIAGNOSIS — K219 Gastro-esophageal reflux disease without esophagitis: Secondary | ICD-10-CM | POA: Diagnosis not present

## 2014-05-23 DIAGNOSIS — E785 Hyperlipidemia, unspecified: Secondary | ICD-10-CM | POA: Diagnosis not present

## 2014-05-23 DIAGNOSIS — M5136 Other intervertebral disc degeneration, lumbar region: Secondary | ICD-10-CM | POA: Diagnosis not present

## 2014-05-23 DIAGNOSIS — J439 Emphysema, unspecified: Secondary | ICD-10-CM | POA: Diagnosis not present

## 2014-05-23 DIAGNOSIS — I2510 Atherosclerotic heart disease of native coronary artery without angina pectoris: Secondary | ICD-10-CM | POA: Diagnosis not present

## 2014-05-26 DIAGNOSIS — E785 Hyperlipidemia, unspecified: Secondary | ICD-10-CM | POA: Diagnosis not present

## 2014-05-26 DIAGNOSIS — I259 Chronic ischemic heart disease, unspecified: Secondary | ICD-10-CM | POA: Diagnosis not present

## 2014-05-26 DIAGNOSIS — I420 Dilated cardiomyopathy: Secondary | ICD-10-CM | POA: Diagnosis not present

## 2014-05-26 DIAGNOSIS — I447 Left bundle-branch block, unspecified: Secondary | ICD-10-CM | POA: Diagnosis not present

## 2014-06-01 DIAGNOSIS — Z961 Presence of intraocular lens: Secondary | ICD-10-CM | POA: Diagnosis not present

## 2014-06-01 DIAGNOSIS — H2511 Age-related nuclear cataract, right eye: Secondary | ICD-10-CM | POA: Diagnosis not present

## 2014-06-01 DIAGNOSIS — H43813 Vitreous degeneration, bilateral: Secondary | ICD-10-CM | POA: Diagnosis not present

## 2014-06-01 DIAGNOSIS — H04123 Dry eye syndrome of bilateral lacrimal glands: Secondary | ICD-10-CM | POA: Diagnosis not present

## 2014-06-07 DIAGNOSIS — I420 Dilated cardiomyopathy: Secondary | ICD-10-CM | POA: Diagnosis not present

## 2014-06-07 DIAGNOSIS — I259 Chronic ischemic heart disease, unspecified: Secondary | ICD-10-CM | POA: Diagnosis not present

## 2014-06-20 DIAGNOSIS — I481 Persistent atrial fibrillation: Secondary | ICD-10-CM | POA: Diagnosis not present

## 2014-06-20 DIAGNOSIS — I420 Dilated cardiomyopathy: Secondary | ICD-10-CM | POA: Diagnosis not present

## 2014-06-20 DIAGNOSIS — I25810 Atherosclerosis of coronary artery bypass graft(s) without angina pectoris: Secondary | ICD-10-CM | POA: Diagnosis not present

## 2014-06-20 DIAGNOSIS — E784 Other hyperlipidemia: Secondary | ICD-10-CM | POA: Diagnosis not present

## 2014-06-20 DIAGNOSIS — R0600 Dyspnea, unspecified: Secondary | ICD-10-CM | POA: Diagnosis not present

## 2014-06-22 DIAGNOSIS — I259 Chronic ischemic heart disease, unspecified: Secondary | ICD-10-CM | POA: Diagnosis not present

## 2014-06-22 DIAGNOSIS — I447 Left bundle-branch block, unspecified: Secondary | ICD-10-CM | POA: Diagnosis not present

## 2014-06-22 DIAGNOSIS — I420 Dilated cardiomyopathy: Secondary | ICD-10-CM | POA: Diagnosis not present

## 2014-06-22 DIAGNOSIS — I4891 Unspecified atrial fibrillation: Secondary | ICD-10-CM | POA: Diagnosis not present

## 2014-06-26 DIAGNOSIS — I4891 Unspecified atrial fibrillation: Secondary | ICD-10-CM | POA: Diagnosis not present

## 2014-06-29 DIAGNOSIS — I509 Heart failure, unspecified: Secondary | ICD-10-CM | POA: Diagnosis not present

## 2014-07-04 DIAGNOSIS — I509 Heart failure, unspecified: Secondary | ICD-10-CM | POA: Diagnosis not present

## 2014-07-04 DIAGNOSIS — E612 Magnesium deficiency: Secondary | ICD-10-CM | POA: Diagnosis not present

## 2014-07-04 DIAGNOSIS — E875 Hyperkalemia: Secondary | ICD-10-CM | POA: Diagnosis not present

## 2014-07-04 DIAGNOSIS — I4891 Unspecified atrial fibrillation: Secondary | ICD-10-CM | POA: Diagnosis not present

## 2014-07-10 DIAGNOSIS — I420 Dilated cardiomyopathy: Secondary | ICD-10-CM | POA: Diagnosis not present

## 2014-07-10 DIAGNOSIS — I2510 Atherosclerotic heart disease of native coronary artery without angina pectoris: Secondary | ICD-10-CM | POA: Diagnosis not present

## 2014-07-10 DIAGNOSIS — I447 Left bundle-branch block, unspecified: Secondary | ICD-10-CM | POA: Diagnosis not present

## 2014-07-10 DIAGNOSIS — I4891 Unspecified atrial fibrillation: Secondary | ICD-10-CM | POA: Diagnosis not present

## 2014-07-17 DIAGNOSIS — I4891 Unspecified atrial fibrillation: Secondary | ICD-10-CM | POA: Diagnosis not present

## 2014-07-18 HISTORY — PX: CARDIAC DEFIBRILLATOR PLACEMENT: SHX171

## 2014-07-25 DIAGNOSIS — I4891 Unspecified atrial fibrillation: Secondary | ICD-10-CM | POA: Diagnosis not present

## 2014-07-28 DIAGNOSIS — J449 Chronic obstructive pulmonary disease, unspecified: Secondary | ICD-10-CM | POA: Diagnosis not present

## 2014-07-28 DIAGNOSIS — I4891 Unspecified atrial fibrillation: Secondary | ICD-10-CM | POA: Diagnosis not present

## 2014-07-28 DIAGNOSIS — Z951 Presence of aortocoronary bypass graft: Secondary | ICD-10-CM | POA: Diagnosis not present

## 2014-07-28 DIAGNOSIS — I481 Persistent atrial fibrillation: Secondary | ICD-10-CM | POA: Diagnosis not present

## 2014-07-28 DIAGNOSIS — I2510 Atherosclerotic heart disease of native coronary artery without angina pectoris: Secondary | ICD-10-CM | POA: Diagnosis not present

## 2014-07-28 DIAGNOSIS — Z87891 Personal history of nicotine dependence: Secondary | ICD-10-CM | POA: Diagnosis not present

## 2014-07-28 DIAGNOSIS — I447 Left bundle-branch block, unspecified: Secondary | ICD-10-CM | POA: Diagnosis not present

## 2014-07-28 DIAGNOSIS — I509 Heart failure, unspecified: Secondary | ICD-10-CM | POA: Diagnosis not present

## 2014-07-28 DIAGNOSIS — Z4889 Encounter for other specified surgical aftercare: Secondary | ICD-10-CM | POA: Diagnosis not present

## 2014-07-28 DIAGNOSIS — S80821A Blister (nonthermal), right lower leg, initial encounter: Secondary | ICD-10-CM | POA: Diagnosis not present

## 2014-07-28 DIAGNOSIS — Z7901 Long term (current) use of anticoagulants: Secondary | ICD-10-CM | POA: Diagnosis not present

## 2014-07-28 DIAGNOSIS — I255 Ischemic cardiomyopathy: Secondary | ICD-10-CM | POA: Diagnosis not present

## 2014-07-28 DIAGNOSIS — E785 Hyperlipidemia, unspecified: Secondary | ICD-10-CM | POA: Diagnosis not present

## 2014-07-28 DIAGNOSIS — K219 Gastro-esophageal reflux disease without esophagitis: Secondary | ICD-10-CM | POA: Diagnosis not present

## 2014-07-28 DIAGNOSIS — Z79899 Other long term (current) drug therapy: Secondary | ICD-10-CM | POA: Diagnosis not present

## 2014-07-28 DIAGNOSIS — I10 Essential (primary) hypertension: Secondary | ICD-10-CM | POA: Diagnosis not present

## 2014-07-28 DIAGNOSIS — R600 Localized edema: Secondary | ICD-10-CM | POA: Diagnosis not present

## 2014-07-29 DIAGNOSIS — I509 Heart failure, unspecified: Secondary | ICD-10-CM | POA: Diagnosis not present

## 2014-07-29 DIAGNOSIS — Z4889 Encounter for other specified surgical aftercare: Secondary | ICD-10-CM | POA: Diagnosis not present

## 2014-07-29 DIAGNOSIS — I255 Ischemic cardiomyopathy: Secondary | ICD-10-CM | POA: Diagnosis not present

## 2014-07-29 DIAGNOSIS — I10 Essential (primary) hypertension: Secondary | ICD-10-CM | POA: Diagnosis not present

## 2014-07-29 DIAGNOSIS — I447 Left bundle-branch block, unspecified: Secondary | ICD-10-CM | POA: Diagnosis not present

## 2014-07-29 DIAGNOSIS — I481 Persistent atrial fibrillation: Secondary | ICD-10-CM | POA: Diagnosis not present

## 2014-07-29 DIAGNOSIS — Z452 Encounter for adjustment and management of vascular access device: Secondary | ICD-10-CM | POA: Diagnosis not present

## 2014-07-31 DIAGNOSIS — I4891 Unspecified atrial fibrillation: Secondary | ICD-10-CM | POA: Diagnosis not present

## 2014-08-07 DIAGNOSIS — I4891 Unspecified atrial fibrillation: Secondary | ICD-10-CM | POA: Diagnosis not present

## 2014-08-07 DIAGNOSIS — I420 Dilated cardiomyopathy: Secondary | ICD-10-CM | POA: Diagnosis not present

## 2014-08-07 DIAGNOSIS — I259 Chronic ischemic heart disease, unspecified: Secondary | ICD-10-CM | POA: Diagnosis not present

## 2014-08-10 DIAGNOSIS — Z95810 Presence of automatic (implantable) cardiac defibrillator: Secondary | ICD-10-CM | POA: Diagnosis not present

## 2014-08-10 DIAGNOSIS — I429 Cardiomyopathy, unspecified: Secondary | ICD-10-CM | POA: Diagnosis not present

## 2014-08-14 DIAGNOSIS — I4891 Unspecified atrial fibrillation: Secondary | ICD-10-CM | POA: Diagnosis not present

## 2014-08-21 DIAGNOSIS — I4891 Unspecified atrial fibrillation: Secondary | ICD-10-CM | POA: Diagnosis not present

## 2014-08-28 DIAGNOSIS — I4891 Unspecified atrial fibrillation: Secondary | ICD-10-CM | POA: Diagnosis not present

## 2014-09-04 DIAGNOSIS — I5023 Acute on chronic systolic (congestive) heart failure: Secondary | ICD-10-CM | POA: Diagnosis not present

## 2014-09-04 DIAGNOSIS — Z95810 Presence of automatic (implantable) cardiac defibrillator: Secondary | ICD-10-CM | POA: Diagnosis not present

## 2014-09-04 DIAGNOSIS — E784 Other hyperlipidemia: Secondary | ICD-10-CM | POA: Diagnosis not present

## 2014-09-11 DIAGNOSIS — I4891 Unspecified atrial fibrillation: Secondary | ICD-10-CM | POA: Diagnosis not present

## 2014-09-11 DIAGNOSIS — I259 Chronic ischemic heart disease, unspecified: Secondary | ICD-10-CM | POA: Diagnosis not present

## 2014-09-11 DIAGNOSIS — I420 Dilated cardiomyopathy: Secondary | ICD-10-CM | POA: Diagnosis not present

## 2014-09-21 DIAGNOSIS — I2510 Atherosclerotic heart disease of native coronary artery without angina pectoris: Secondary | ICD-10-CM | POA: Diagnosis not present

## 2014-09-25 DIAGNOSIS — I4891 Unspecified atrial fibrillation: Secondary | ICD-10-CM | POA: Diagnosis not present

## 2014-10-09 DIAGNOSIS — I4891 Unspecified atrial fibrillation: Secondary | ICD-10-CM | POA: Diagnosis not present

## 2014-10-23 DIAGNOSIS — I4891 Unspecified atrial fibrillation: Secondary | ICD-10-CM | POA: Diagnosis not present

## 2014-10-23 DIAGNOSIS — I259 Chronic ischemic heart disease, unspecified: Secondary | ICD-10-CM | POA: Diagnosis not present

## 2014-10-23 DIAGNOSIS — I420 Dilated cardiomyopathy: Secondary | ICD-10-CM | POA: Diagnosis not present

## 2014-10-30 DIAGNOSIS — Z95810 Presence of automatic (implantable) cardiac defibrillator: Secondary | ICD-10-CM | POA: Diagnosis not present

## 2014-10-30 DIAGNOSIS — I5020 Unspecified systolic (congestive) heart failure: Secondary | ICD-10-CM | POA: Diagnosis not present

## 2014-11-06 DIAGNOSIS — I4891 Unspecified atrial fibrillation: Secondary | ICD-10-CM | POA: Diagnosis not present

## 2014-11-21 DIAGNOSIS — I481 Persistent atrial fibrillation: Secondary | ICD-10-CM | POA: Diagnosis not present

## 2014-11-21 DIAGNOSIS — I4891 Unspecified atrial fibrillation: Secondary | ICD-10-CM | POA: Diagnosis not present

## 2014-11-21 DIAGNOSIS — Z23 Encounter for immunization: Secondary | ICD-10-CM | POA: Diagnosis not present

## 2014-11-29 DIAGNOSIS — I420 Dilated cardiomyopathy: Secondary | ICD-10-CM | POA: Diagnosis not present

## 2014-11-29 DIAGNOSIS — I259 Chronic ischemic heart disease, unspecified: Secondary | ICD-10-CM | POA: Diagnosis not present

## 2014-12-04 DIAGNOSIS — Z0000 Encounter for general adult medical examination without abnormal findings: Secondary | ICD-10-CM | POA: Diagnosis not present

## 2014-12-04 DIAGNOSIS — I4891 Unspecified atrial fibrillation: Secondary | ICD-10-CM | POA: Diagnosis not present

## 2015-01-02 ENCOUNTER — Encounter: Payer: Self-pay | Admitting: Internal Medicine

## 2015-01-02 ENCOUNTER — Ambulatory Visit (INDEPENDENT_AMBULATORY_CARE_PROVIDER_SITE_OTHER): Payer: Medicare Other | Admitting: Internal Medicine

## 2015-01-02 VITALS — BP 162/84 | HR 68 | Temp 97.3°F | Resp 16 | Ht 69.5 in | Wt 250.8 lb

## 2015-01-02 DIAGNOSIS — E039 Hypothyroidism, unspecified: Secondary | ICD-10-CM | POA: Insufficient documentation

## 2015-01-02 DIAGNOSIS — K219 Gastro-esophageal reflux disease without esophagitis: Secondary | ICD-10-CM

## 2015-01-02 DIAGNOSIS — N400 Enlarged prostate without lower urinary tract symptoms: Secondary | ICD-10-CM | POA: Insufficient documentation

## 2015-01-02 DIAGNOSIS — R7309 Other abnormal glucose: Secondary | ICD-10-CM | POA: Diagnosis not present

## 2015-01-02 DIAGNOSIS — E785 Hyperlipidemia, unspecified: Secondary | ICD-10-CM | POA: Insufficient documentation

## 2015-01-02 DIAGNOSIS — E559 Vitamin D deficiency, unspecified: Secondary | ICD-10-CM

## 2015-01-02 DIAGNOSIS — Z6836 Body mass index (BMI) 36.0-36.9, adult: Secondary | ICD-10-CM

## 2015-01-02 DIAGNOSIS — Z7901 Long term (current) use of anticoagulants: Secondary | ICD-10-CM | POA: Diagnosis not present

## 2015-01-02 DIAGNOSIS — Z5181 Encounter for therapeutic drug level monitoring: Secondary | ICD-10-CM

## 2015-01-02 DIAGNOSIS — I10 Essential (primary) hypertension: Secondary | ICD-10-CM | POA: Insufficient documentation

## 2015-01-02 DIAGNOSIS — E782 Mixed hyperlipidemia: Secondary | ICD-10-CM | POA: Diagnosis not present

## 2015-01-02 DIAGNOSIS — Z79899 Other long term (current) drug therapy: Secondary | ICD-10-CM

## 2015-01-02 DIAGNOSIS — G4733 Obstructive sleep apnea (adult) (pediatric): Secondary | ICD-10-CM | POA: Insufficient documentation

## 2015-01-02 DIAGNOSIS — I4891 Unspecified atrial fibrillation: Secondary | ICD-10-CM | POA: Insufficient documentation

## 2015-01-02 DIAGNOSIS — R7303 Prediabetes: Secondary | ICD-10-CM

## 2015-01-02 NOTE — Patient Instructions (Signed)

## 2015-01-03 ENCOUNTER — Encounter: Payer: Self-pay | Admitting: Internal Medicine

## 2015-01-03 ENCOUNTER — Other Ambulatory Visit: Payer: Self-pay | Admitting: Internal Medicine

## 2015-01-03 LAB — CBC WITH DIFFERENTIAL/PLATELET
BASOS ABS: 0.1 10*3/uL (ref 0.0–0.1)
Basophils Relative: 1 % (ref 0–1)
EOS ABS: 0.2 10*3/uL (ref 0.0–0.7)
EOS PCT: 2 % (ref 0–5)
HCT: 42 % (ref 39.0–52.0)
Hemoglobin: 13.5 g/dL (ref 13.0–17.0)
Lymphocytes Relative: 20 % (ref 12–46)
Lymphs Abs: 1.8 10*3/uL (ref 0.7–4.0)
MCH: 28.1 pg (ref 26.0–34.0)
MCHC: 32.1 g/dL (ref 30.0–36.0)
MCV: 87.3 fL (ref 78.0–100.0)
MONO ABS: 0.5 10*3/uL (ref 0.1–1.0)
MPV: 9.8 fL (ref 8.6–12.4)
Monocytes Relative: 6 % (ref 3–12)
Neutro Abs: 6.4 10*3/uL (ref 1.7–7.7)
Neutrophils Relative %: 71 % (ref 43–77)
PLATELETS: 426 10*3/uL — AB (ref 150–400)
RBC: 4.81 MIL/uL (ref 4.22–5.81)
RDW: 19 % — AB (ref 11.5–15.5)
WBC: 9 10*3/uL (ref 4.0–10.5)

## 2015-01-03 LAB — HEPATIC FUNCTION PANEL
ALBUMIN: 4.3 g/dL (ref 3.6–5.1)
ALK PHOS: 48 U/L (ref 40–115)
ALT: 23 U/L (ref 9–46)
AST: 26 U/L (ref 10–35)
BILIRUBIN INDIRECT: 0.6 mg/dL (ref 0.2–1.2)
BILIRUBIN TOTAL: 0.7 mg/dL (ref 0.2–1.2)
Bilirubin, Direct: 0.1 mg/dL (ref <=0.2)
TOTAL PROTEIN: 6.9 g/dL (ref 6.1–8.1)

## 2015-01-03 LAB — HEMOGLOBIN A1C
Hgb A1c MFr Bld: 6 % — ABNORMAL HIGH (ref <5.7)
MEAN PLASMA GLUCOSE: 126 mg/dL — AB (ref <117)

## 2015-01-03 LAB — BASIC METABOLIC PANEL WITH GFR
BUN: 17 mg/dL (ref 7–25)
CALCIUM: 9.9 mg/dL (ref 8.6–10.3)
CO2: 28 mmol/L (ref 20–31)
Chloride: 101 mmol/L (ref 98–110)
Creat: 0.86 mg/dL (ref 0.70–1.18)
GFR, EST NON AFRICAN AMERICAN: 85 mL/min (ref >=60)
Glucose, Bld: 79 mg/dL (ref 65–99)
POTASSIUM: 4.9 mmol/L (ref 3.5–5.3)
Sodium: 139 mmol/L (ref 135–146)

## 2015-01-03 LAB — LIPID PANEL
CHOLESTEROL: 192 mg/dL (ref 125–200)
HDL: 18 mg/dL — AB (ref >=40)
LDL CALC: 123 mg/dL (ref <130)
TRIGLYCERIDES: 257 mg/dL — AB (ref <150)
Total CHOL/HDL Ratio: 10.7 Ratio — ABNORMAL HIGH (ref <=5.0)
VLDL: 51 mg/dL — ABNORMAL HIGH (ref <30)

## 2015-01-03 LAB — TSH: TSH: 1.901 u[IU]/mL (ref 0.350–4.500)

## 2015-01-03 LAB — PROTIME-INR
INR: 1.68 — AB (ref <1.50)
PROTHROMBIN TIME: 19.8 s — AB (ref 11.6–15.2)

## 2015-01-03 LAB — MAGNESIUM: Magnesium: 1.8 mg/dL (ref 1.5–2.5)

## 2015-01-03 LAB — INSULIN, RANDOM: INSULIN: 7.7 u[IU]/mL (ref 2.0–19.6)

## 2015-01-03 LAB — VITAMIN D 25 HYDROXY (VIT D DEFICIENCY, FRACTURES): Vit D, 25-Hydroxy: 39 ng/mL (ref 30–100)

## 2015-01-03 MED ORDER — ATORVASTATIN CALCIUM 80 MG PO TABS: ORAL_TABLET | ORAL | Status: DC

## 2015-01-03 NOTE — Progress Notes (Signed)
Patient ID: [name] [name], male   DOB: 1940-06-13, 74 y.o.   MRN: [id number]   This very nice 74 y.o. MWM returns 4-5 year hiatus moving to [place] to care for his mother with Alzheimer's and now returns to re-establish care.  presents for 3 month follow up with Hypertension, Hyperlipidemia, Pre-Diabetes and Vitamin D Deficiency. Patient also has hx/o OSA, but relates intolerance to the mask(s).    Patient relates hx/o HTN & HLD dating to his 89's  Circa 1965. Today's BP is elevated at 162/85. In July 2015 he had a CABG x 3V and had Afib & 2 failed CV and has been on coumadin since. In Sept 2015, he had implant of a Defib AICD. He was routinely having BMET's done monthly & PT/INR's done every 2 weeks routinely. Patient has had no complaints of any cardiac type chest pain, palpitations, dyspnea/orthopnea/PND, dizziness, claudication, or dependent edema.   Hyperlipidemia is controlled with diet & meds. Patient denies myalgias or other med SE's. Last Lipids were reviewed & at goal.    Also, the patient has history of PreDiabetes and has had no symptoms of reactive hypoglycemia, diabetic polys, paresthesias or visual blurring.  Last A1c was 6.0% in July 2015.     Patient is on Omeprazole with control of GERD sx's. Patient also is on thyroid replacement. Further, the patient is uncertain status of Vit D levels and none were seen on accompanying lab work. Finally , patient has hx/o Lum Laminectomies x 3 in 1985, 1995 and then 2007 by Dr [name] [name]. In 2013 he had a lumbar fusion in [place]. Has minimal LBP & no sciatica.     Medication List   albuterol 108 (90 BASE) MCG/ACT inhaler  Commonly known as:  PROVENTIL HFA;VENTOLIN HFA  Inhale 2 puffs into the lungs every 6 (six) hours as needed for wheezing or shortness of breath.     allopurinol 300 MG tablet  Commonly known as:  ZYLOPRIM  Take 300 mg by mouth daily.     finasteride 5 MG tablet  Commonly known as:  PROSCAR  Take 5 mg by mouth daily.     fluticasone 50 MCG/ACT nasal spray  Commonly known as:  FLONASE  Place into both nostrils daily. PRN     FUROSEMIDE PO  Take 80 mg by mouth daily.     levothyroxine 200 MCG tablet  Commonly known as:  SYNTHROID, LEVOTHROID  Take 200 mcg by mouth daily before breakfast.     losartan 50 MG tablet  Commonly known as:  COZAAR  Take 50 mg by mouth daily.     metoprolol succinate 50 MG 24 hr tablet  Commonly known as:  TOPROL-XL  Take 50 mg by mouth daily. Take with or immediately following a meal.     omeprazole 20 MG capsule  Commonly known as:  PRILOSEC  Take 20 mg by mouth 2 (two) times daily.     OVER THE COUNTER MEDICATION  daily. Vitamin A to Z 50 plus     OVER THE COUNTER MEDICATION  Eye drops daily PRN     polyethylene glycol packet  Commonly known as:  MIRALAX / GLYCOLAX  Take 17 g by mouth daily.     pravastatin 40 MG tablet  Commonly known as:  PRAVACHOL  Take 40 mg by mouth daily.     spironolactone 25 MG tablet  Commonly known as:  ALDACTONE  Take 25 mg by mouth daily.     VITAMIN D PO  Take 5,000 Units by mouth daily.     warfarin 5 MG tablet  Commonly known as:  COUMADIN  Take 5 mg by mouth daily. Takes 7.5 mg on Monday-Friday and 5 mg on Sat and Sun           No Known Allergies  PMHx:  No past medical history on file.  There is no immunization history on file for this patient. No past surgical history on file. FHx:    Reviewed / unchanged  SHx:    Reviewed / unchanged  Systems Review:  Constitutional: Denies fever, chills, wt changes, headaches, insomnia, fatigue, night sweats, change in appetite. Eyes: Denies redness, blurred vision, diplopia, discharge, itchy, watery eyes.  ENT: Denies discharge, congestion, post nasal drip, epistaxis, sore throat, earache, hearing loss, dental pain, tinnitus, vertigo, sinus pain, snoring.  CV: Denies chest pain, palpitations, irregular heartbeat, syncope, dyspnea, diaphoresis, orthopnea, PND,  claudication or edema. Respiratory: denies cough, dyspnea, DOE, pleurisy, hoarseness, laryngitis, wheezing.  Gastrointestinal: Denies dysphagia, odynophagia, heartburn, reflux, water brash, abdominal pain or cramps, nausea, vomiting, bloating, diarrhea, constipation, hematemesis, melena, hematochezia  or hemorrhoids. Genitourinary: Denies dysuria, frequency, urgency, nocturia, hesitancy, discharge, hematuria or flank pain. Musculoskeletal: Denies arthralgias, myalgias, stiffness, jt. swelling, pain, limping or strain/sprain.  Skin: Denies pruritus, rash, hives, warts, acne, eczema or change in skin lesion(s). Neuro: No weakness, tremor, incoordination, spasms, paresthesia or pain. Psychiatric: Denies confusion, memory loss or sensory loss. Endo: Denies change in weight, skin or hair change.  Heme/Lymph: No excessive bleeding, bruising or enlarged lymph nodes.  Physical Exam  BP 162/84   Pulse 68  Temp 97.3 F   Resp 16  Ht 5' 9.5"   Wt 250 lb 12.8 oz    BMI 36.52   Appears Over nourished, truncal obesity and in no distress.  Eyes: Mild/mod Cataract OD &  Pseudophakia OS w/IOL implant.   PERRLA, EOMs, conjunctiva no swelling or erythema. Sinuses: No frontal/maxillary tenderness ENT/Mouth: Rt EAC clear / Lt w Cerumen Impaction, Rt TM nl w/o erythema, bulging. Lt TM non viz.  Nares clear w/septum deviated rightward. No erythema, swelling, exudates. Oropharynx clear without erythema or exudates. Oral hygiene is good. Tongue normal, non obstructing. Hearing intact.  Neck: Supple. Thyroid nl. Car 2+/2+ without bruits, nodes or JVD. Chest: Median Sternotomy scar. Respirations nl with BS clear & equal w/o rales, rhonchi, wheezing or stridor.  Cor: Heart sounds normal w/ regular rate and rhythm with Gr 2 Sys M at Ao area & no gallops, clicks, or rubs. Peripheral pulses normal and equal  With 1-2+ chronic venous insufficiency type edema. edema.  Abdomen: Soft & rotund . Non-tender w/o guarding,  rebound, hernias, masses, or organomegaly.  Lymphatics: Unremarkable.  Musculoskeletal: Full ROM all peripheral extremities, joint stability, 5/5 strength, and normal gait.  Skin: Warm, dry without exposed rashes, lesions or ecchymosis apparent.  Neuro: Cranial nerves intact, reflexes equal bilaterally. Sensory-motor testing grossly intact. Tendon reflexes grossly intact.  Pysch: Alert & oriented x 3.  Insight and judgement nl & appropriate. No ideations.  Assessment and Plan:  1. Essential hypertension  - TSH  2. Mixed hyperlipidemia  - Lipid panel  3. Prediabetes  - Hemoglobin A1c - Insulin, random  4. Vitamin D deficiency  - Vit D  25 hydroxy  5. ASHD s/p CABG & Atrial fibrillation w/AICD/defib   6. Gastroesophageal reflux disease   7. Hypothyroidism   8. OSA (obstructive sleep apnea)   9. Monitoring for anticoagulant use  - Protime-INR  10.  Medication management  - CBC with Differential/Platelet - BASIC METABOLIC PANEL WITH GFR - Hepatic function panel - Magnesium  11. Morbid obesity (36.52)   12. BMI 36.0-36.9,adult   Recommended regular exercise, BP monitoring, weight control, and discussed med and SE's. Recommended labs to assess and monitor clinical status. Further disposition pending results of labs. Over 60 minutes of exam, counseling, chart & record review was performed

## 2015-01-26 ENCOUNTER — Other Ambulatory Visit: Payer: Self-pay | Admitting: Internal Medicine

## 2015-01-26 ENCOUNTER — Telehealth: Payer: Self-pay | Admitting: *Deleted

## 2015-01-26 MED ORDER — PREDNISONE 20 MG PO TABS: ORAL_TABLET | ORAL | Status: DC

## 2015-01-26 NOTE — Telephone Encounter (Signed)
Patient called and states he has a poison oak rash all over his body after cutting trees and shrubs.  Per Dr Melford Aase, [place] to send in an RX for Prednisone.

## 2015-02-06 ENCOUNTER — Encounter: Payer: Self-pay | Admitting: Internal Medicine

## 2015-02-06 ENCOUNTER — Ambulatory Visit (INDEPENDENT_AMBULATORY_CARE_PROVIDER_SITE_OTHER): Payer: Medicare Other | Admitting: Internal Medicine

## 2015-02-06 VITALS — BP 130/78 | HR 73 | Temp 97.7°F | Resp 16 | Ht 69.5 in | Wt 246.0 lb

## 2015-02-06 DIAGNOSIS — I10 Essential (primary) hypertension: Secondary | ICD-10-CM

## 2015-02-06 DIAGNOSIS — Z6835 Body mass index (BMI) 35.0-35.9, adult: Secondary | ICD-10-CM

## 2015-02-06 DIAGNOSIS — R7309 Other abnormal glucose: Secondary | ICD-10-CM

## 2015-02-06 DIAGNOSIS — R7303 Prediabetes: Secondary | ICD-10-CM

## 2015-02-06 DIAGNOSIS — Z950 Presence of cardiac pacemaker: Secondary | ICD-10-CM

## 2015-02-06 DIAGNOSIS — Z7901 Long term (current) use of anticoagulants: Secondary | ICD-10-CM | POA: Diagnosis not present

## 2015-02-06 DIAGNOSIS — I4891 Unspecified atrial fibrillation: Secondary | ICD-10-CM | POA: Diagnosis not present

## 2015-02-06 DIAGNOSIS — D6869 Other thrombophilia: Secondary | ICD-10-CM | POA: Insufficient documentation

## 2015-02-06 DIAGNOSIS — I2510 Atherosclerotic heart disease of native coronary artery without angina pectoris: Secondary | ICD-10-CM | POA: Insufficient documentation

## 2015-02-06 DIAGNOSIS — Z5181 Encounter for therapeutic drug level monitoring: Secondary | ICD-10-CM | POA: Diagnosis not present

## 2015-02-06 NOTE — Progress Notes (Signed)
Patient ID: [name] [name], male   DOB: 1940-07-21, 74 y.o.   MRN: [id number]   This very nice 74 y.o. MWM presents for 3 month follow up with Hypertension, ASHD/CABG & chAfib, Hyperlipidemia, gout,  Pre-Diabetes and Vitamin D Deficiency.    Patient is treated for HTN & BP has been controlled at home. Today's BP: 130/78 mmHg. In July 2015 , he underwent a 3V CABG and has po Afib failing cardioversion. Patient has had no complaints of any cardiac type chest pain, palpitations, dyspnea/orthopnea/PND, dizziness, claudication, or dependent edema. At last visit 1 month ago, he had his dose of coumadin changed to 7.5 mg daily. In Sept 2015 he had a perm Pacer/AICD implanted .    Hyperlipidemia is controlled with diet & meds. Patient denies myalgias or other med SE's. Last Lipids were not at goal  Cholesterol 192; HDL 18*; LDL 123; and elevated Triglycerides 257 on 01/02/2015, so he was switched from pravastatin to Atorvastatin.    Also, the patient has history of PreDiabetes since July 2015 with A1c 6.0% and has had no symptoms of reactive hypoglycemia, diabetic polys, paresthesias or visual blurring.  Last A1c was 6.0% on 01/02/2015.   Further, the patient also has history of Vitamin D Deficiency and supplements vitamin D without any suspected side-effects. Last vitamin D was still low at 39 on 01/02/2015.  Medication Sig  . acetaminophen (TYLENOL) 500 MG tablet Take 500 mg by mouth every 6 (six) hours as needed. Takes 2 tab BID  . albuterol (PROVENTIL HFA;VENTOLIN HFA) 108 (90 BASE) MCG/ACT inhaler Inhale 2 puffs into the lungs every 6 (six) hours as needed for wheezing or shortness of breath.  . allopurinol (ZYLOPRIM) 300 MG tablet Take 300 mg by mouth daily.  Marland Kitchen atorvastatin (LIPITOR) 80 MG tablet Take 1/2 to 1 tablet daily or as directed for Cholesterol  . Cholecalciferol (VITAMIN D PO) Take 5,000 Units by mouth daily.  . finasteride (PROSCAR) 5 MG tablet Take 5 mg by mouth daily.  Marland Kitchen FLONASE nasal spray  Place into both nostrils daily. PRN  . FUROSEMIDE  Take 80 mg by mouth daily.  Marland Kitchen levothyroxine  200 MCG tablet Take 200 mcg by mouth daily before breakfast.  . losartan (COZAAR) 50 MG tablet Take 50 mg by mouth daily.  . metoprolol succinate -XL) 50 MG 24 hr tablet Take 50 mg by mouth daily. Take with or immediately following a meal.  . omeprazole (PRILOSEC) 20 MG cap Take 20 mg by mouth 2 (two) times daily.  . polyethylene glycol (MIRALAX / GLYCOLAX) packet Take 17 g by mouth daily.  Marland Kitchen spironolactone (ALDACTONE) 25 MG tablet Take 25 mg by mouth daily.  Marland Kitchen warfarin (COUMADIN) 5 MG tablet Take 5 mg by mouth daily. Takes 7.5 mg on Monday-Friday and 5 mg on Sat and Sun   No Known Allergies  PMHx:   Past Medical History  Diagnosis Date  . Cataract     s/p left  . COPD (chronic obstructive pulmonary disease)     by CXR]  . Prediabetes 01/2014  . GERD (gastroesophageal reflux disease)   . Sleep apnea   . Hyperlipidemia   . Hypertension   . Thyroid disease     hypothyroid  . ASHD (arteriosclerotic heart disease) 2015    s/p CABG  . A-fib 01/2014   Immunization History  Administered Date(s) Administered  . Pneumococcal Conjugate-13 11/21/2014   Past Surgical History  Procedure Laterality Date  . Coronary artery bypass graft  11/2013  .  Lumb laminectomy  [phone]    x 3  . Lumbar fusion  2013  . Cardiac defibrillator placement  07/2014   FHx:    Reviewed / unchanged  SHx:    Reviewed / unchanged  Systems Review:  Constitutional: Denies fever, chills, wt changes, headaches, insomnia, fatigue, night sweats, change in appetite. Eyes: Denies redness, blurred vision, diplopia, discharge, itchy, watery eyes.  ENT: Denies discharge, congestion, post nasal drip, epistaxis, sore throat, earache, hearing loss, dental pain, tinnitus, vertigo, sinus pain, snoring.  CV: Denies chest pain, palpitations, irregular heartbeat, syncope, dyspnea, diaphoresis, orthopnea, PND, claudication or  edema. Respiratory: denies cough, dyspnea, DOE, pleurisy, hoarseness, laryngitis, wheezing.  Gastrointestinal: Denies dysphagia, odynophagia, heartburn, reflux, water brash, abdominal pain or cramps, nausea, vomiting, bloating, diarrhea, constipation, hematemesis, melena, hematochezia  or hemorrhoids. Genitourinary: Denies dysuria, frequency, urgency, nocturia, hesitancy, discharge, hematuria or flank pain. Musculoskeletal: Denies arthralgias, myalgias, stiffness, jt. swelling, pain, limping or strain/sprain.  Skin: Denies pruritus, rash, hives, warts, acne, eczema or change in skin lesion(s). Neuro: No weakness, tremor, incoordination, spasms, paresthesia or pain. Psychiatric: Denies confusion, memory loss or sensory loss. Endo: Denies change in weight, skin or hair change.  Heme/Lymph: No excessive bleeding, bruising or enlarged lymph nodes.  Physical Exam  BP 130/78 mmHg  Pulse 73  Temp(Src) 97.7 F (36.5 C)  Resp 16  Ht 5' 9.5" (1.765 m)  Wt 246 lb (111.585 kg)  BMI 35.82 kg/m2  Appears well nourished and in no distress. Eyes: PERRLA, EOMs, conjunctiva no swelling or erythema. Sinuses: No frontal/maxillary tenderness ENT/Mouth: EAC's clear, TM's nl w/o erythema, bulging. Nares clear w/o erythema, swelling, exudates. Oropharynx clear without erythema or exudates. Oral hygiene is good. Tongue normal, non obstructing. Hearing intact.  Neck: Supple. Thyroid nl. Car 2+/2+ without bruits, nodes or JVD. Chest: Respirations nl with BS clear & equal w/o rales, rhonchi, wheezing or stridor. Median sternotomy scar.  Cor: Heart sounds normal w/ sl irregular rate and rhythm without sig. murmurs, gallops, clicks, or rubs. Peripheral pulses normal and equal  without edema.  Abdomen: Soft & bowel sounds normal. Non-tender w/o guarding, rebound, hernias, masses, or organomegaly.  Lymphatics: Unremarkable.  Musculoskeletal: Full ROM all peripheral extremities, joint stability, 5/5 strength, and  normal gait.  Skin: Warm, dry without exposed rashes, lesions or ecchymosis apparent.  Neuro: Cranial nerves intact, reflexes equal bilaterally. Sensory-motor testing grossly intact. Tendon reflexes grossly intact.  Pysch: Alert & oriented x 3.  Insight and judgement nl & appropriate. No ideations.  Assessment and Plan:  1. Essential hypertension   2. Atrial fibrillation, chronic   3. Encounter for monitoring  anticoagulation therapy  - Protime-INR  4. ASHD s/p CABG  (11/2013)   5. Cardiac pacemaker/AICD   (01/2014)     Recommended regular exercise, BP monitoring, weight control, and discussed med and SE's. Recommended labs to assess and monitor clinical status. Further disposition pending results of labs. Over 30 minutes of exam, counseling, chart review was performed

## 2015-02-06 NOTE — Patient Instructions (Signed)
Warfarin tablets  What is this medicine?  WARFARIN (WAR far in) is an anticoagulant. It is used to treat or prevent clots in the veins, arteries, lungs, or heart.  This medicine may be used for other purposes; ask your health care provider or pharmacist if you have questions.  COMMON BRAND NAME(S): Coumadin, Jantoven  What should I tell my health care provider before I take this medicine?  They need to know if you have any of these conditions:  -alcoholism  -anemia  -bleeding disorders  -cancer  -diabetes  -heart disease  -high blood pressure  -history of bleeding in the gastrointestinal tract  -history of stroke or other brain injury or disease  -kidney or liver disease  -protein C deficiency  -protein S deficiency  -psychosis or dementia  -recent injury, recent or planned surgery or procedure  -an unusual or allergic reaction to warfarin, other medicines, foods, dyes, or preservatives  -pregnant or trying to get pregnant  -breast-feeding  How should I use this medicine?  Take this medicine by mouth with a glass of water. Follow the directions on the prescription label. You can take this medicine with or without food. Take your medicine at the same time each day. Do not take it more often than directed. Do not stop taking except on your doctor's advice. Stopping this medicine may increase your risk of a blood clot. Be sure to refill your prescription before you run out of medicine.  If your doctor or healthcare professional calls to change your dose, write down the dose and any other instructions. Always read the dose and instructions back to him or her to make sure you understand them. Tell your doctor or healthcare professional what strength of tablets you have on hand. Ask how many tablets you should take to equal your new dose. Write the date on the new instructions and keep them near your medicine. If you are told to stop taking your medicine until your next blood test, call your doctor or healthcare  professional if you do not hear anything within 24 hours of the test to find out your new dose or when to restart your prior dose.  A special MedGuide will be given to you by the pharmacist with each prescription and refill. Be sure to read this information carefully each time.  Talk to your pediatrician regarding the use of this medicine in children. Special care may be needed.  Overdosage: If you think you have taken too much of this medicine contact a poison control center or emergency room at once.  NOTE: This medicine is only for you. Do not share this medicine with others.  What if I miss a dose?  It is important not to miss a dose. If you miss a dose, call your healthcare provider. Take the dose as soon as possible on the same day. If it is almost time for your next dose, take only that dose. Do not take double or extra doses to make up for a missed dose.  What may interact with this medicine?  Do not take this medicine with any of the following medications:  -agents that prevent or dissolve blood clots  -aspirin or other salicylates  -danshen  -dextrothyroxine  -mifepristone  -St. John's Wort  -red yeast rice  This medicine may also interact with the following medications:  -acetaminophen  -agents that lower cholesterol  -alcohol  -allopurinol  -amiodarone  -antibiotics or medicines for treating bacterial, fungal or viral infections  -azathioprine  -  barbiturate medicines for inducing sleep or treating seizures  -certain medicines for diabetes  -certain medicines for heart rhythm problems  -certain medicines for high blood pressure  -chloral hydrate  -cisapride  -disulfiram  -male hormones, including contraceptive or birth control pills  -general anesthetics  -herbal or dietary products like garlic, ginkgo, ginseng, green tea, or kava kava  -influenza virus vaccine  -male hormones  -medicines for mental depression or psychosis  -medicines for some types of cancer  -medicines for stomach  problems  -methylphenidate  -NSAIDs, medicines for pain and inflammation, like ibuprofen or naproxen  -propoxyphene  -quinidine, quinine  -raloxifene  -seizure or epilepsy medicine like carbamazepine, phenytoin, and valproic acid  -steroids like cortisone and prednisone  -tamoxifen  -thyroid medicine  -tramadol  -vitamin c, vitamin e, and vitamin K  -zafirlukast  -zileuton  This list may not describe all possible interactions. Give your health care provider a list of all the medicines, herbs, non-prescription drugs, or dietary supplements you use. Also tell them if you smoke, drink alcohol, or use illegal drugs. Some items may interact with your medicine.  What should I watch for while using this medicine?  Visit your doctor or health care professional for regular checks on your progress. You will need to have a blood test called a PT/INR regularly. The PT/INR blood test is done to make sure you are getting the right dose of this medicine. It is important to not miss your appointment for the blood tests. When you first start taking this medicine, these tests are done often. Once the correct dose is determined and you take your medicine properly, these tests can be done less often.  Notify your doctor or health care professional and seek emergency treatment if you develop breathing problems; changes in vision; chest pain; severe, sudden headache; pain, swelling, warmth in the leg; trouble speaking; sudden numbness or weakness of the face, arm or leg. These can be signs that your condition has gotten worse.  While you are taking this medicine, carry an identification card with your name, the name and dose of medicine(s) being used, and the name and phone number of your doctor or health care professional or person to contact in an emergency.  Do not start taking or stop taking any medicines or over-the-counter medicines except on the advice of your doctor or health care professional.  You should discuss your diet with  your doctor or health care professional. Do not make major changes in your diet. Vitamin K can affect how well this medicine works. Many foods contain vitamin K. It is important to eat a consistent amount of foods with vitamin K. Other foods with vitamin K that you should eat in consistent amounts are asparagus, basil, beef or pork liver, black eyed peas, broccoli, brussel sprouts, cabbage, chickpeas, cucumber with peel, green onions, green tea, okra, parsley, peas, thyme, and green leafy vegetables like beet greens, collard greens, endive, kale, mustard greens, spinach, turnip greens, watercress, or certain lettuces like green leaf or romaine.  This medicine can cause birth defects or bleeding in an unborn child. Women of childbearing age should use effective birth control while taking this medicine. If a woman becomes pregnant while taking this medicine, she should discuss the potential risks and her options with her health care professional.  Avoid sports and activities that might cause injury while you are using this medicine. Severe falls or injuries can cause unseen bleeding. Be careful when using sharp tools or knives. Consider using   an electric razor. Take special care brushing or flossing your teeth. Report any injuries, bruising, or red spots on the skin to your doctor or health care professional.  If you have an illness that causes vomiting, diarrhea, or fever for more than a few days, contact your doctor. Also check with your doctor if you are unable to eat for several days. These problems can change the effect of this medicine.  Even after you stop taking this medicine, it takes several days before your body recovers its normal ability to clot blood. Ask your doctor or health care professional how long you need to be careful. If you are going to have surgery or dental work, tell your doctor or health care professional that you have been taking this medicine.  What side effects may I notice from  receiving this medicine?  Side effects that you should report to your doctor or health care professional as soon as possible:  -back pain  -chills  -dizziness  -fever  -heavy menstrual bleeding or vaginal bleeding  -painful, blue, or purple toes  -painful, prolonged erection  -signs and symptoms of bleeding such as bloody or black, tarry stools; red or dark-brown urine; spitting up blood or brown material that looks like coffee grounds; red spots on the skin; unusual bruising or bleeding from the eye, gums, or nose-skin rash, itching or skin damage  -stomach pain  -unusually weak or tired  -yellowing of skin or eyes  Side effects that usually do not require medical attention (report to your doctor or health care professional if they continue or are bothersome):  -diarrhea  -hair loss  This list may not describe all possible side effects. Call your doctor for medical advice about side effects. You may report side effects to FDA at 1-800-FDA-1088.  Where should I keep my medicine?  Keep out of the reach of children.  Store at room temperature between 15 and 30 degrees C (59 and 86 degrees F). Protect from light. Throw away any unused medicine after the expiration date. Do not flush down the toilet.  NOTE: This sheet is a summary. It may not cover all possible information. If you have questions about this medicine, talk to your doctor, pharmacist, or health care provider.   2015, [employer]vier/[employer] Standard. (2012-11-24 12:17:56)

## 2015-02-07 ENCOUNTER — Telehealth: Payer: Self-pay | Admitting: *Deleted

## 2015-02-07 LAB — PROTIME-INR
INR: 1.99 — ABNORMAL HIGH (ref <1.50)
PROTHROMBIN TIME: 22.9 s — AB (ref 11.6–15.2)

## 2015-02-07 NOTE — Telephone Encounter (Signed)
Patient called and states his poison ivy rash on his legs cleared up on Prednisone, but he continues to have a rash around his waist.  Per Dr Melford Aase, call in an RX for Aristocort Cream 0.1 % 60 gm x 5 refills to apply 2-3 times daily to rash.  RX called to [facility] in [place] at [phone].  Patient aware.

## 2015-02-22 ENCOUNTER — Ambulatory Visit (INDEPENDENT_AMBULATORY_CARE_PROVIDER_SITE_OTHER): Payer: Medicare Other

## 2015-02-22 DIAGNOSIS — L237 Allergic contact dermatitis due to plants, except food: Secondary | ICD-10-CM | POA: Diagnosis not present

## 2015-02-22 MED ORDER — DEXAMETHASONE SODIUM PHOSPHATE 100 MG/10ML IJ SOLN
10.0000 mg | Freq: Once | INTRAMUSCULAR | Status: AC
  Administered 2015-02-22: 10 mg via INTRAMUSCULAR

## 2015-03-15 ENCOUNTER — Ambulatory Visit: Payer: Self-pay | Admitting: Physician Assistant

## 2015-03-15 ENCOUNTER — Ambulatory Visit (INDEPENDENT_AMBULATORY_CARE_PROVIDER_SITE_OTHER): Payer: Medicare Other | Admitting: Physician Assistant

## 2015-03-15 DIAGNOSIS — Z7901 Long term (current) use of anticoagulants: Secondary | ICD-10-CM | POA: Diagnosis not present

## 2015-03-15 DIAGNOSIS — Z950 Presence of cardiac pacemaker: Secondary | ICD-10-CM

## 2015-03-15 DIAGNOSIS — I4891 Unspecified atrial fibrillation: Secondary | ICD-10-CM | POA: Diagnosis not present

## 2015-03-15 DIAGNOSIS — I2510 Atherosclerotic heart disease of native coronary artery without angina pectoris: Secondary | ICD-10-CM

## 2015-03-15 NOTE — Progress Notes (Signed)
Patient ID: [name] [name], male   DOB: 10/14/1940, 74 y.o.   MRN: [id number] Patient presents for recheck PT/INR.  States he currently is taking 7.5 mg Coumadin QD.

## 2015-03-16 LAB — PROTIME-INR
INR: 1.96 — AB (ref <1.50)
Prothrombin Time: 22.6 seconds — ABNORMAL HIGH (ref 11.6–15.2)

## 2015-04-23 ENCOUNTER — Other Ambulatory Visit: Payer: Self-pay

## 2015-04-23 ENCOUNTER — Encounter: Payer: Self-pay | Admitting: Physician Assistant

## 2015-04-23 ENCOUNTER — Ambulatory Visit (INDEPENDENT_AMBULATORY_CARE_PROVIDER_SITE_OTHER): Payer: Medicare Other | Admitting: Physician Assistant

## 2015-04-23 VITALS — BP 130/80 | HR 75 | Temp 97.3°F | Resp 14 | Ht 69.5 in | Wt 261.0 lb

## 2015-04-23 DIAGNOSIS — G4733 Obstructive sleep apnea (adult) (pediatric): Secondary | ICD-10-CM | POA: Diagnosis not present

## 2015-04-23 DIAGNOSIS — Z950 Presence of cardiac pacemaker: Secondary | ICD-10-CM

## 2015-04-23 DIAGNOSIS — N400 Enlarged prostate without lower urinary tract symptoms: Secondary | ICD-10-CM | POA: Diagnosis not present

## 2015-04-23 DIAGNOSIS — Z0001 Encounter for general adult medical examination with abnormal findings: Secondary | ICD-10-CM

## 2015-04-23 DIAGNOSIS — J342 Deviated nasal septum: Secondary | ICD-10-CM

## 2015-04-23 DIAGNOSIS — R6889 Other general symptoms and signs: Secondary | ICD-10-CM | POA: Diagnosis not present

## 2015-04-23 DIAGNOSIS — M109 Gout, unspecified: Secondary | ICD-10-CM

## 2015-04-23 DIAGNOSIS — K219 Gastro-esophageal reflux disease without esophagitis: Secondary | ICD-10-CM | POA: Diagnosis not present

## 2015-04-23 DIAGNOSIS — Z23 Encounter for immunization: Secondary | ICD-10-CM

## 2015-04-23 DIAGNOSIS — I10 Essential (primary) hypertension: Secondary | ICD-10-CM | POA: Diagnosis not present

## 2015-04-23 DIAGNOSIS — M10079 Idiopathic gout, unspecified ankle and foot: Secondary | ICD-10-CM | POA: Diagnosis not present

## 2015-04-23 DIAGNOSIS — Z0000 Encounter for general adult medical examination without abnormal findings: Secondary | ICD-10-CM

## 2015-04-23 DIAGNOSIS — M1000 Idiopathic gout, unspecified site: Secondary | ICD-10-CM | POA: Diagnosis not present

## 2015-04-23 DIAGNOSIS — E782 Mixed hyperlipidemia: Secondary | ICD-10-CM | POA: Diagnosis not present

## 2015-04-23 DIAGNOSIS — R7309 Other abnormal glucose: Secondary | ICD-10-CM

## 2015-04-23 DIAGNOSIS — E039 Hypothyroidism, unspecified: Secondary | ICD-10-CM | POA: Diagnosis not present

## 2015-04-23 DIAGNOSIS — I2510 Atherosclerotic heart disease of native coronary artery without angina pectoris: Secondary | ICD-10-CM

## 2015-04-23 DIAGNOSIS — I4891 Unspecified atrial fibrillation: Secondary | ICD-10-CM

## 2015-04-23 DIAGNOSIS — Z7901 Long term (current) use of anticoagulants: Secondary | ICD-10-CM

## 2015-04-23 DIAGNOSIS — Z79899 Other long term (current) drug therapy: Secondary | ICD-10-CM

## 2015-04-23 DIAGNOSIS — E559 Vitamin D deficiency, unspecified: Secondary | ICD-10-CM | POA: Diagnosis not present

## 2015-04-23 DIAGNOSIS — Z5181 Encounter for therapeutic drug level monitoring: Secondary | ICD-10-CM

## 2015-04-23 LAB — HEPATIC FUNCTION PANEL
ALT: 22 U/L (ref 9–46)
AST: 28 U/L (ref 10–35)
Albumin: 4.6 g/dL (ref 3.6–5.1)
Alkaline Phosphatase: 46 U/L (ref 40–115)
BILIRUBIN DIRECT: 0.1 mg/dL (ref <=0.2)
BILIRUBIN INDIRECT: 0.7 mg/dL (ref 0.2–1.2)
BILIRUBIN TOTAL: 0.8 mg/dL (ref 0.2–1.2)
TOTAL PROTEIN: 6.6 g/dL (ref 6.1–8.1)

## 2015-04-23 LAB — LIPID PANEL
CHOLESTEROL: 160 mg/dL (ref 125–200)
HDL: 19 mg/dL — ABNORMAL LOW (ref >=40)
LDL Cholesterol: 102 mg/dL (ref <130)
TRIGLYCERIDES: 193 mg/dL — AB (ref <150)
Total CHOL/HDL Ratio: 8.4 Ratio — ABNORMAL HIGH (ref <=5.0)
VLDL: 39 mg/dL — AB (ref <30)

## 2015-04-23 LAB — BASIC METABOLIC PANEL WITH GFR
BUN: 19 mg/dL (ref 7–25)
CALCIUM: 9.4 mg/dL (ref 8.6–10.3)
CO2: 30 mmol/L (ref 20–31)
Chloride: 99 mmol/L (ref 98–110)
Creat: 1.03 mg/dL (ref 0.70–1.18)
GFR, EST AFRICAN AMERICAN: 82 mL/min (ref >=60)
GFR, EST NON AFRICAN AMERICAN: 71 mL/min (ref >=60)
GLUCOSE: 88 mg/dL (ref 65–99)
Potassium: 4.8 mmol/L (ref 3.5–5.3)
SODIUM: 136 mmol/L (ref 135–146)

## 2015-04-23 LAB — URIC ACID: Uric Acid, Serum: 5.3 mg/dL (ref 4.0–7.8)

## 2015-04-23 LAB — MAGNESIUM: Magnesium: 1.6 mg/dL (ref 1.5–2.5)

## 2015-04-23 LAB — TSH: TSH: 2.391 u[IU]/mL (ref 0.350–4.500)

## 2015-04-23 MED ORDER — DOXAZOSIN MESYLATE 4 MG PO TABS: 4.0000 mg | ORAL_TABLET | Freq: Every day | ORAL | Status: DC

## 2015-04-23 NOTE — Progress Notes (Signed)
MEDICARE ANNUAL WELLNESS VISIT AND FOLLOW UP Assessment:   1. Need for prophylactic vaccination and inoculation against influenza - Flu vaccine HIGH DOSE PF (Fluzone High dose) - Protime-INR  2. Essential hypertension - continue medications, DASH diet, exercise and monitor at home. Call if greater than 130/80.  - CBC with Differential/Platelet - BASIC METABOLIC PANEL WITH GFR - Hepatic function panel  3. Atrial fibrillation, unspecified type ([place]) Check INR, has follow up with cardio, rate controlled.  - Protime-INR  4. 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   5. Hypothyroidism, unspecified hypothyroidism type Hypothyroidism-check TSH level, continue medications the same, reminded to take on an empty stomach 30-37mins before food.  - TSH  6. Morbid obesity, unspecified obesity type ([place]) Obesity with co morbidities- long discussion about weight loss, diet, and exercise  7. Mixed hyperlipidemia -continue medications, check lipids, decrease fatty foods, increase activity.  - Lipid panel  8. BPH (benign prostatic hyperplasia) Will get on doxazosin 4 mg at night for BPH symptoms - PSA  9. Vitamin D deficiency Continue supplement  10. Abnormal glucose Discussed general issues about diabetes pathophysiology and management., Educational material distributed., Suggested low cholesterol diet., Encouraged aerobic exercise., Discussed foot care., Reminded to get yearly retinal exam. - Hemoglobin A1c  11. Medication management - Magnesium  12. Encounter for monitoring coumadin therapy Check INR  13. Cardiac pacemaker/AICD (01/2014) Will follow up cardio for interrogation  14. Gastroesophageal reflux disease, esophagitis presence not specified Continue PPI/H2 blocker, diet discussed  15. OSA (obstructive sleep apnea) Can not tolerate CPAP, discussed mouth piece, would like to see ENT first for right deviated septum.   16.  Deviated septum Right - Ambulatory referral to ENT  17. Gout of ankle, unspecified cause, unspecified chronicity, unspecified laterality Gout- recheck Uric acid as needed, Diet discussed, continue medications. - Uric acid  Over 30 minutes of exam, counseling, chart review, and critical decision making was performed  Plan:   During the course of the visit the patient was educated and counseled about appropriate screening and preventive services including:    Pneumococcal vaccine   Influenza vaccine  Prevnar 13  Td vaccine  Screening electrocardiogram  Colorectal cancer screening  Diabetes screening  Glaucoma screening  Nutrition counseling   Conditions/risks identified: BMI: Discussed weight loss, diet, and increase physical activity.  Increase physical activity: AHA recommends 150 minutes of physical activity a week.  Medications reviewed Diabetes is at goal, ACE/ARB therapy: Yes. Urinary Incontinence is not an issue: discussed non pharmacology and pharmacology options.  Fall risk: high- discussed PT, home fall assessment, medications.    Subjective:  [name] [name] is a 74 y.o. male who presents for Medicare Annual Wellness Visit and 3 month follow up for HTN, hyperlipidemia, prediabetes, and vitamin D Def.  Date of last medicare wellness visit was is unknown.  74 y.o. male  presents for 3 month follow up on hypertension, cholesterol, prediabetes, and vitamin D deficiency.   His blood pressure has been controlled at home, today their BP is BP: 130/80 mmHg  He does not workout. He denies chest pain, shortness of breath, dizziness. He has had CABG x 3 in July 2015 and has had Afib with failed CV, is on coumadin. He has heart failure, no notes, unknown EF, need notes with AICD, going to see cardio next week. No shocks.  He is on BB, ARB, spirolactone, he is not on lasix.  Wt Readings from Last 3 Encounters:  04/23/15 261 lb (  118.389 kg)  02/06/15 246 lb (111.585  kg)  01/02/15 250 lb 12.8 oz (113.762 kg)   Lab Results  Component Value Date   GFRNONAA 85 01/02/2015    He is on cholesterol medication and denies myalgias. His cholesterol is at goal. The cholesterol last visit was:   Lab Results  Component Value Date   CHOL 192 01/02/2015   HDL 18* 01/02/2015   LDLCALC 123 01/02/2015   TRIG 257* 01/02/2015   CHOLHDL 10.7* 01/02/2015    He has been working on diet and exercise for prediabetes, and denies paresthesia of the feet, polydipsia, polyuria and visual disturbances. Last A1C in the office was:  Lab Results  Component Value Date   HGBA1C 6.0* 01/02/2015   [name]   Lab Results  Component Value Date   VD25OH 39 01/02/2015     Patient is on allopurinol for gout and does not report a recent flare.  His left 4th digit has popping, and trigger finger.  He has several age spots but has darker spots on left temple.  He does have lower BPH symptoms, has urinary frequency, has hesitancy dribbling.  He has history of OSA, not on machine due to claustrophobia, he has seen ENT and has been told he has deviated septum, he would like to get that looked at.  He is on thyroid medication. His medication was not changed last visit.   Lab Results  Component Value Date   TSH 1.901 01/02/2015   BMI is Body mass index is 38 kg/(m^2)., he is working on diet and exercise. Wt Readings from Last 3 Encounters:  04/23/15 261 lb (118.389 kg)  02/06/15 246 lb (111.585 kg)  01/02/15 250 lb 12.8 oz (113.762 kg)     Medication Review: Current Outpatient Prescriptions on File Prior to Visit  Medication Sig Dispense Refill  . acetaminophen (TYLENOL) 500 MG tablet Take 500 mg by mouth every 6 (six) hours as needed. Takes 2 tab BID    . albuterol (PROVENTIL HFA;VENTOLIN HFA) 108 (90 BASE) MCG/ACT inhaler Inhale 2 puffs into the lungs every 6 (six) hours as needed for wheezing or shortness of breath.    . allopurinol (ZYLOPRIM) 300 MG  tablet Take 300 mg by mouth daily.    Marland Kitchen atorvastatin (LIPITOR) 80 MG tablet Take 1/2 to 1 tablet daily or as directed for Cholesterol 90 tablet 11  . Cholecalciferol (VITAMIN D PO) Take 5,000 Units by mouth daily.    . finasteride (PROSCAR) 5 MG tablet Take 5 mg by mouth daily.    . fluticasone (FLONASE) 50 MCG/ACT nasal spray Place into both nostrils daily. PRN    . levothyroxine (SYNTHROID, LEVOTHROID) 200 MCG tablet Take 200 mcg by mouth daily before breakfast.    . losartan (COZAAR) 50 MG tablet Take 50 mg by mouth daily.    . metoprolol succinate (TOPROL-XL) 50 MG 24 hr tablet Take 50 mg by mouth daily. Take with or immediately following a meal.    . omeprazole (PRILOSEC) 20 MG capsule Take 20 mg by mouth 2 (two) times daily.    Marland Kitchen OVER THE COUNTER MEDICATION daily. Vitamin A to Z 50 plus    . OVER THE COUNTER MEDICATION Eye drops daily PRN    . polyethylene glycol (MIRALAX / GLYCOLAX) packet Take 17 g by mouth daily.    Marland Kitchen spironolactone (ALDACTONE) 25 MG tablet Take 25 mg by mouth daily.    Marland Kitchen warfarin (COUMADIN) 5 MG tablet Take 5  mg by mouth daily. Takes 7.5 mg on Monday-Friday and 5 mg on Sat and Sun     No current facility-administered medications on file prior to visit.    Current Problems (verified) Patient Active Problem List   Diagnosis Date Noted  . ASHD/CABG x 3V (11/2013) 02/06/2015  . Encounter for monitoring coumadin therapy 02/06/2015  . Cardiac pacemaker/AICD (01/2014) 02/06/2015  . Morbid obesity (36.52) 01/03/2015  . HTN (hypertension) 01/02/2015  . Mixed hyperlipidemia 01/02/2015  . Prediabetes 01/02/2015  . Vitamin D deficiency 01/02/2015  . GERD  01/02/2015  . BPH (benign prostatic hyperplasia) 01/02/2015  . Medication management 01/02/2015  . Atrial fibrillation ([place]) 01/02/2015  . Hypothyroidism 01/02/2015  . OSA (obstructive sleep apnea) 01/02/2015    Screening Tests Immunization History  Administered Date(s) Administered  . Influenza, High Dose  Seasonal PF 04/23/2015  . Pneumococcal Conjugate-13 11/21/2014   Preventative care: Last colonoscopy: 2009  Prior vaccinations: TD or Tdap: Will get notes Influenza: 2016  Pneumococcal: 2016 Prevnar13: 2016 Shingles/Zostavax: declines at this time, thinks may have  Names of Other Physician/Practitioners you currently use: 1. [facility] here for primary care Needs new doctors here in [place] Patient Care Team: [name], MD as PCP - General (Internal Medicine)  Past Surgical History  Procedure Laterality Date  . Coronary artery bypass graft  11/2013  . Lumb laminectomy  [phone]    x 3  . Lumbar fusion  2013  . Cardiac defibrillator placement  07/2014   Family History  Problem Relation Age of Onset  . Alzheimer's disease Mother   . Mental illness Mother   . Depression Mother   . Heart disease Father 55    had 5 MI's & died 20 yo  . Alcohol abuse Son   . Stroke Maternal Grandmother    Social History  Substance Use Topics  . Smoking status: Former Smoker    Quit date: 05/20/1983  . Smokeless tobacco: None  . Alcohol Use: No    MEDICARE WELLNESS OBJECTIVES: Tobacco use: He does not smoke.  Patient is a former smoker. If yes, counseling given Alcohol Current alcohol use: none Osteoporosis: dietary calcium and/or vitamin D deficiency, History of fracture in the past year: no Fall risk: High Risk Hearing: impaired Diet: in general, a "healthy" diet   Physical activity: Current Exercise Habits:: The patient does not participate in regular exercise at present Cardiac risk factors: Cardiac Risk Factors include: advanced age (>48men, >71 women);dyslipidemia;hypertension;male gender;obesity (BMI >30kg/m2);sedentary lifestyle;family history of premature cardiovascular disease Depression/mood screen:   Depression screen [facility] 2/9 04/23/2015  Decreased Interest 0  Down, Depressed, Hopeless 0  PHQ - 2 Score 0    ADLs:  In  your present state of health, do you have any difficulty performing the following activities: 04/23/2015  Hearing? N  Vision? Y  Difficulty concentrating or making decisions? Y  Walking or climbing stairs? N  Dressing or bathing? N  Doing errands, shopping? N  Preparing Food and eating ? N  Using the Toilet? N  In the past six months, have you accidently leaked urine? N  Do you have problems with loss of bowel control? N  Managing your Medications? N  Managing your Finances? N  Housekeeping or managing your Housekeeping? N     Cognitive Testing  Alert? Yes  Normal Appearance?Yes  Oriented to person? Yes  Place? Yes   Time? Yes  Recall of three objects?  Yes  Can perform simple calculations? Yes  Displays appropriate judgment?Yes  Can read the correct time from a watch face?Yes  EOL planning: Does patient have an advance directive?: No Would patient like information on creating an advanced directive?: Yes - Educational materials given   Review of Systems:  Review of Systems  Constitutional: Negative.   HENT: Negative.   Eyes: Negative.   Respiratory: Negative.   Cardiovascular: Positive for leg swelling. Negative for chest pain, palpitations, orthopnea, claudication and PND.  Gastrointestinal: Positive for constipation and blood in stool. Negative for heartburn, nausea, vomiting, abdominal pain, diarrhea and melena.  Genitourinary: Positive for urgency and frequency. Negative for dysuria, hematuria and flank pain.  Musculoskeletal: Positive for back pain, joint pain and falls. Negative for myalgias and neck pain.  Skin: Negative.   Neurological: Negative.   Psychiatric/Behavioral: Negative.      Objective:   Today's Vitals   04/23/15 1554  BP: 130/80  Pulse: 75  Temp: 97.3 F (36.3 C)  TempSrc: Temporal  Resp: 14  Height: 5' 9.5" (1.765 m)  Weight: 261 lb (118.389 kg)  SpO2: 93%   Body mass index is 38 kg/(m^2).  General Appearance: Well nourished, in no  apparent distress. Eyes: PERRLA, EOMs, conjunctiva no swelling or erythema Sinuses: No Frontal/maxillary tenderness ENT/Mouth: Ext aud canals clear, TMs without erythema, bulging. No erythema, swelling, or exudate on post pharynx.  Tonsils not swollen or erythematous. Hearing decreased.  Neck: Supple, thyroid normal.  Respiratory: Respiratory effort normal, BS equal bilaterally without rales, rhonchi, wheezing or stridor.  Cardio: RRR with no MRGs. Brisk peripheral pulses with edema 2+.  Abdomen: Soft, + BS,obese  Non tender, no guarding, rebound, hernias, masses. Lymphatics: Non tender without lymphadenopathy.  Musculoskeletal: Full ROM, 5/5 strength, Normal gait Skin: Well healed vertical vein harvest site with stasis dermatitis. Warm, dry without rashes, lesions, ecchymosis.  Neuro: Cranial nerves intact. Normal muscle tone, no cerebellar symptoms. Psych: Awake and oriented X 3, normal affect, Insight and Judgment appropriate.   Medicare Attestation I have personally reviewed: The patient's medical and social history Their use of alcohol, tobacco or illicit drugs Their current medications and supplements The patient's functional ability including ADLs,fall risks, home safety risks, cognitive, and hearing and visual impairment Diet and physical activities Evidence for depression or mood disorders  The patient's weight, height, BMI, and visual acuity have been recorded in the chart.  I have made referrals, counseling, and provided education to the patient based on review of the above and I have provided the patient with a written personalized care plan for preventive services.     Vicie Mutters, PA-C   04/23/2015

## 2015-04-23 NOTE — Patient Instructions (Addendum)
Start 1/4 of the doxazosin at night before bed for 1 week, then do 1/2 of the doxazosin at night for 1 week. Stay on the doxazosin or increase to 1 pill. If you get dizzy with the doxazosin go back to the previous dose. This medication can cause hypotension so be careful with standing in the beginning and decrease the dose if you get dizzy. Please drink plenty of fluids.   [employer] with no obligation # [phone] Do not have to be a member Tues-Sat 10-6  [place]- free test with no obligation # [phone] MUST BE A MEMBER Call for store hours   Trigger Finger Trigger finger (digital tendinitis and stenosing tenosynovitis) is a common disorder that causes an often painful catching of the fingers or thumb. It occurs as a clicking, snapping, or locking of a finger in the palm of the hand. This is caused by a problem with the tendons that flex or bend the fingers sliding smoothly through their sheaths. The condition may occur in any finger or a couple fingers at the same time.  The finger may lock with the finger curled or suddenly straighten out with a snap. This is more common in patients with rheumatoid arthritis and diabetes. Left untreated, the condition may get worse to the point where the finger becomes locked in flexion, like making a fist, or less commonly locked with the finger straightened out. CAUSES   Inflammation and scarring that lead to swelling around the tendon sheath.  Repeated or forceful movements.  Rheumatoid arthritis, an autoimmune disease that affects joints.  Gout.  Diabetes mellitus. SIGNS AND SYMPTOMS  Soreness and swelling of your finger.  A painful clicking or snapping as you bend and straighten your finger. DIAGNOSIS  Your health care provider will do a physical exam of your finger to diagnose trigger finger. TREATMENT   Splinting for 6-8 weeks may be helpful.  Nonsteroidal anti-inflammatory medicines (NSAIDs) can  help to relieve the pain and inflammation.  Cortisone injections, along with splinting, may speed up recovery. Several injections may be required. Cortisone may give relief after one injection.  Surgery is another treatment that may be used if conservative treatments do not work. Surgery can be minor, without incisions (a cut does not have to be made), and can be done with a needle through the skin.  Other surgical choices involve an open procedure in which the surgeon opens the hand through a small incision and cuts the pulley so the tendon can again slide smoothly. Your hand will still work fine. HOME CARE INSTRUCTIONS  Apply ice to the injured area, twice per day:  Put ice in a plastic bag.  Place a towel between your skin and the bag.  Leave the ice on for 20 minutes, 3-4 times a day.  Rest your hand often. MAKE SURE YOU:   Understand these instructions.  Will watch your condition.  Will get help right away if you are not doing well or get worse.   This information is not intended to replace advice given to you by your health care provider. Make sure you discuss any questions you have with your health care provider.   Document Released: 02/23/2004 Document Revised: 01/05/2013 Document Reviewed: 10/05/2012 Elsevier Interactive Patient Education 2016 [place] following things EVERYDAY: 1) Weigh yourself in the morning before breakfast. Write it down and keep it in a log. 2) Take your medicines as prescribed 3) Eat low salt  foods-Limit salt (sodium) to 2000 mg per day. Best thing to do is avoid processed foods.   4) Stay as active as you can everyday 5) Limit all fluids for the day to less than 2 liters  Call your doctor if:  Anytime you have any of the following symptoms:  1) 3 pound weight gain in 24 hours or 5 pounds in 1 week  2) shortness of breath, with or without a dry hacking cough  3) swelling in the hands, feet or stomach  4) if you have to sleep  on extra pillows at night in order to breathe. 5) after laying down at night for 20-30 mins, you wake up short of breath.   These can all be signs of fluid overload.

## 2015-04-24 DIAGNOSIS — G4733 Obstructive sleep apnea (adult) (pediatric): Secondary | ICD-10-CM | POA: Diagnosis not present

## 2015-04-24 DIAGNOSIS — J342 Deviated nasal septum: Secondary | ICD-10-CM | POA: Diagnosis not present

## 2015-04-24 LAB — CBC WITH DIFFERENTIAL/PLATELET
Basophils Absolute: 0.1 10*3/uL (ref 0.0–0.1)
Basophils Relative: 1 % (ref 0–1)
EOS PCT: 3 % (ref 0–5)
Eosinophils Absolute: 0.3 10*3/uL (ref 0.0–0.7)
HEMATOCRIT: 40.8 % (ref 39.0–52.0)
Hemoglobin: 13.9 g/dL (ref 13.0–17.0)
LYMPHS PCT: 17 % (ref 12–46)
Lymphs Abs: 1.7 10*3/uL (ref 0.7–4.0)
MCH: 29.4 pg (ref 26.0–34.0)
MCHC: 34.1 g/dL (ref 30.0–36.0)
MCV: 86.4 fL (ref 78.0–100.0)
MONO ABS: 0.5 10*3/uL (ref 0.1–1.0)
MONOS PCT: 5 % (ref 3–12)
MPV: 10.9 fL (ref 8.6–12.4)
Neutro Abs: 7.3 10*3/uL (ref 1.7–7.7)
Neutrophils Relative %: 74 % (ref 43–77)
PLATELETS: 440 10*3/uL — AB (ref 150–400)
RBC: 4.72 MIL/uL (ref 4.22–5.81)
RDW: 17.3 % — AB (ref 11.5–15.5)
WBC: 9.8 10*3/uL (ref 4.0–10.5)

## 2015-04-24 LAB — HEMOGLOBIN A1C
Hgb A1c MFr Bld: 6 % — ABNORMAL HIGH (ref <5.7)
Mean Plasma Glucose: 126 mg/dL — ABNORMAL HIGH (ref <117)

## 2015-04-24 LAB — PSA: PSA: 0.08 ng/mL (ref <=4.00)

## 2015-04-24 LAB — PROTIME-INR
INR: 2.23 — ABNORMAL HIGH (ref <1.50)
PROTHROMBIN TIME: 25 s — AB (ref 11.6–15.2)

## 2015-04-30 ENCOUNTER — Other Ambulatory Visit: Payer: Self-pay

## 2015-04-30 MED ORDER — LEVOTHYROXINE SODIUM 200 MCG PO TABS: 200.0000 ug | ORAL_TABLET | Freq: Every day | ORAL | Status: DC

## 2015-04-30 MED ORDER — ALLOPURINOL 300 MG PO TABS: 300.0000 mg | ORAL_TABLET | Freq: Every day | ORAL | Status: DC

## 2015-04-30 MED ORDER — FINASTERIDE 5 MG PO TABS
5.0000 mg | ORAL_TABLET | Freq: Every day | ORAL | Status: DC
Start: 2015-04-30 — End: 2016-02-27

## 2015-05-02 DIAGNOSIS — Z95810 Presence of automatic (implantable) cardiac defibrillator: Secondary | ICD-10-CM | POA: Diagnosis not present

## 2015-05-02 DIAGNOSIS — I255 Ischemic cardiomyopathy: Secondary | ICD-10-CM | POA: Diagnosis not present

## 2015-06-21 ENCOUNTER — Encounter: Payer: Self-pay | Admitting: Internal Medicine

## 2015-06-21 ENCOUNTER — Other Ambulatory Visit: Payer: Self-pay | Admitting: *Deleted

## 2015-06-21 ENCOUNTER — Ambulatory Visit (INDEPENDENT_AMBULATORY_CARE_PROVIDER_SITE_OTHER): Payer: Medicare Other | Admitting: Internal Medicine

## 2015-06-21 VITALS — BP 140/74 | HR 64 | Temp 97.4°F | Resp 16 | Ht 69.5 in | Wt 264.0 lb

## 2015-06-21 DIAGNOSIS — Z79899 Other long term (current) drug therapy: Secondary | ICD-10-CM | POA: Diagnosis not present

## 2015-06-21 DIAGNOSIS — E039 Hypothyroidism, unspecified: Secondary | ICD-10-CM | POA: Diagnosis not present

## 2015-06-21 DIAGNOSIS — I4891 Unspecified atrial fibrillation: Secondary | ICD-10-CM

## 2015-06-21 DIAGNOSIS — E559 Vitamin D deficiency, unspecified: Secondary | ICD-10-CM | POA: Diagnosis not present

## 2015-06-21 DIAGNOSIS — I10 Essential (primary) hypertension: Secondary | ICD-10-CM | POA: Diagnosis not present

## 2015-06-21 DIAGNOSIS — Z5181 Encounter for therapeutic drug level monitoring: Secondary | ICD-10-CM

## 2015-06-21 DIAGNOSIS — R7309 Other abnormal glucose: Secondary | ICD-10-CM | POA: Diagnosis not present

## 2015-06-21 DIAGNOSIS — E782 Mixed hyperlipidemia: Secondary | ICD-10-CM | POA: Diagnosis not present

## 2015-06-21 DIAGNOSIS — Z7901 Long term (current) use of anticoagulants: Secondary | ICD-10-CM

## 2015-06-21 MED ORDER — METOPROLOL SUCCINATE ER 50 MG PO TB24: 50.0000 mg | ORAL_TABLET | Freq: Every day | ORAL | Status: DC

## 2015-06-21 NOTE — Patient Instructions (Signed)

## 2015-06-22 DIAGNOSIS — I255 Ischemic cardiomyopathy: Secondary | ICD-10-CM | POA: Insufficient documentation

## 2015-06-22 LAB — PROTIME-INR
INR: 1.96 — ABNORMAL HIGH (ref <1.50)
Prothrombin Time: 22.6 seconds — ABNORMAL HIGH (ref 11.6–15.2)

## 2015-06-22 NOTE — Progress Notes (Signed)
Patient ID: [name] [name], male   DOB: 1940-11-11, 75 y.o.   MRN: [id number]     This very nice 75 y.o. MWM  presents for Coag follow up with Hypertension, Hyperlipidemia, Pre-Diabetes and Vitamin D Deficiency. He has recently established Cardiology care with Dr Wendie Chess in [place].     Patient is treated for HTN dating to his 73's Circa 1965 & BP has been controlled at home. Today's BP: 140/74 mmHg.  In July 2015 he had a CABG x 3V and had Afib w/failed CV x 2 and has been on coumadin since. In Sept 2015, he had Defib AICD implanted.  Patient has had no complaints of any cardiac type chest pain, palpitations, dyspnea/orthopnea/PND, dizziness, claudication, or dependent edema. Patient has had no complaints of any cardiac type chest pain, palpitations, dyspnea/orthopnea/PND, dizziness, claudication, or dependent edema.     Hyperlipidemia is not controlled with diet & meds. Patient denies myalgias or other med SE's. Last Lipids were not at goal with Cholesterol 160; HDL 19*; LDL 102; Triglycerides 193 on 04/23/2015.     Also, the patient has history of PreDiabetes with A1c 6.0% in July 2015 and has had no symptoms of reactive hypoglycemia, diabetic polys, paresthesias or visual blurring.  Last A1c was 6.0% on  04/23/2015.     Further, the patient also has history of Vitamin D Deficiency of 39 in Aug 2016 and supplements vitamin D without any suspected side-effects.   Medication Sig  . acetaminophen  500 MG tablet Take 500 mg by mouth every 6 (six) hours as needed. Takes 2 tab BID  . albuterol  HFA inhaler Inhale 2 puffs into the lungs every 6 (six) hours as needed for wheezing or shortness of breath.  . allopurinol  300 MG tablet Take 1 tablet (300 mg total) by mouth daily.  Marland Kitchen atorvastatin  80 MG tablet Take 1/2 to 1 tablet daily or as directed for Cholesterol  . VITAMIN D  Take 15,000 Units by mouth daily.   Marland Kitchen doxazosin  4 MG tablet Take 1 tablet (4 mg total) by mouth daily.  . finasteride 5 MG  tablet Take 1 tablet (5 mg total) by mouth daily.  Marland Kitchen FLONASE nasal spray Place into both nostrils daily. PRN  . furosemide (LASIX) 40 MG tablet   . levothyroxine  200 MCG tablet Take 1 tablet (200 mcg total) by mouth daily before breakfast.  . losartan (COZAAR) 50 MG tablet Take 50 mg by mouth daily.  Marland Kitchen omeprazole  20 MG capsule Take 20 mg by mouth 2 (two) times daily.  Marland Kitchen OVER THE COUNTER MEDICATION daily. Vitamin A to Z 50 plus  . OVER THE COUNTER MEDICATION Eye drops daily PRN  . MIRALAX   Take 17 g by mouth daily.  Marland Kitchen spironolactone  25 MG tablet Take 25 mg by mouth daily.  Marland Kitchen warfarin (COUMADIN) 5 MG tablet Take 5 mg by mouth daily. Takes 7.5 mg on Monday-Friday and 5 mg on Sat and Sun  . metoprolol succinate (TOPROL-XL) 50 MG 24 hr tablet Take 50 mg by mouth daily. Take with or immediately following a meal.  . triamcinolone cream (KENALOG) 0.1 %    No Known Allergies  PMHx:   Past Medical History  Diagnosis Date  . Cataract     s/p left  . COPD (chronic obstructive pulmonary disease) ([place])     by CXR]  . Prediabetes 01/2014  . GERD (gastroesophageal reflux disease)   . Sleep apnea   .  Hyperlipidemia   . Hypertension   . Thyroid disease     hypothyroid  . ASHD (arteriosclerotic heart disease) 2015    s/p CABG  . A-fib ([place]) 01/2014   Immunization History  Administered Date(s) Administered  . Influenza, High Dose Seasonal PF 04/23/2015  . Pneumococcal Conjugate-13 11/21/2014   Past Surgical History  Procedure Laterality Date  . Coronary artery bypass graft  11/2013  . Lumb laminectomy  [phone]    x 3  . Lumbar fusion  2013  . Cardiac defibrillator placement  07/2014   FHx:    Reviewed / unchanged  SHx:    Reviewed / unchanged  Systems Review:  Constitutional: Denies fever, chills, wt changes, headaches, insomnia, fatigue, night sweats, change in appetite. Eyes: Denies redness, blurred vision, diplopia, discharge, itchy, watery eyes.  ENT: Denies discharge,  congestion, post nasal drip, epistaxis, sore throat, earache, hearing loss, dental pain, tinnitus, vertigo, sinus pain, snoring.  CV: Denies chest pain, palpitations, irregular heartbeat, syncope, dyspnea, diaphoresis, orthopnea, PND, claudication or edema. Respiratory: denies cough, dyspnea, DOE, pleurisy, hoarseness, laryngitis, wheezing.  Gastrointestinal: Denies dysphagia, odynophagia, heartburn, reflux, water brash, abdominal pain or cramps, nausea, vomiting, bloating, diarrhea, constipation, hematemesis, melena, hematochezia  or hemorrhoids. Genitourinary: Denies dysuria, frequency, urgency, nocturia, hesitancy, discharge, hematuria or flank pain. Musculoskeletal: Denies arthralgias, myalgias, stiffness, jt. swelling, pain, limping or strain/sprain.  Skin: Denies pruritus, rash, hives, warts, acne, eczema or change in skin lesion(s). Neuro: No weakness, tremor, incoordination, spasms, paresthesia or pain. Psychiatric: Denies confusion, memory loss or sensory loss. Endo: Denies change in weight, skin or hair change.  Heme/Lymph: No excessive bleeding, bruising or enlarged lymph nodes.  Physical Exam  BP 140/74 mmHg  Pulse 64  Temp(Src) 97.4 F (36.3 C)  Resp 16  Ht 5' 9.5" (1.765 m)  Wt 264 lb (119.75 kg)  BMI 38.44 kg/m2  Appears well nourished and in no distress. Eyes: PERRLA, EOMs, conjunctiva no swelling or erythema. Sinuses: No frontal/maxillary tenderness ENT/Mouth: EAC's clear, TM's nl w/o erythema, bulging. Nares clear w/o erythema, swelling, exudates. Oropharynx clear without erythema or exudates. Oral hygiene is good. Tongue normal, non obstructing. Hearing intact.  Neck: Supple. Thyroid nl. Car 2+/2+ without bruits, nodes or JVD. Chest: Respirations nl with BS clear & equal w/o rales, rhonchi, wheezing or stridor.  Cor: Heart sounds normal w/ regular rate and rhythm without sig. murmurs, gallops, clicks, or rubs. Peripheral pulses normal and equal  without edema.   Abdomen: Soft & bowel sounds normal. Non-tender w/o guarding, rebound, hernias, masses, or organomegaly.  Lymphatics: Unremarkable.  Musculoskeletal: Full ROM all peripheral extremities, joint stability, 5/5 strength, and normal gait.  Skin: Warm, dry without exposed rashes, lesions or ecchymosis apparent.  Neuro: Cranial nerves intact, reflexes equal bilaterally. Sensory-motor testing grossly intact. Tendon reflexes grossly intact.  Pysch: Alert & oriented x 3.  Insight and judgement nl & appropriate. No ideations.  Assessment and Plan:  1. Essential hypertension   2. Mixed hyperlipidemia   3. Abnormal glucose   4. Vitamin D deficiency   5. Hypothyroidism   6. Atrial fibrillation(HCC)   7. Encounter for monitoring coumadin therapy  - Protime-INR - Patient indicates he will have f/u coag monitoring done at his new Cardiologist which is closer to his home.  8. Medication management   Recommended regular exercise, BP monitoring, weight control, and discussed med and SE's. Recommended labs to assess and monitor clinical status. Further disposition pending results of labs. Over 30 minutes of exam, counseling, chart review was  performed. ROV 3 mo for Lipid/BP monitoring.

## 2015-06-23 ENCOUNTER — Encounter: Payer: Self-pay | Admitting: Internal Medicine

## 2015-06-26 DIAGNOSIS — H527 Unspecified disorder of refraction: Secondary | ICD-10-CM | POA: Diagnosis not present

## 2015-06-26 DIAGNOSIS — Z961 Presence of intraocular lens: Secondary | ICD-10-CM | POA: Diagnosis not present

## 2015-06-26 DIAGNOSIS — H25811 Combined forms of age-related cataract, right eye: Secondary | ICD-10-CM | POA: Diagnosis not present

## 2015-07-17 DIAGNOSIS — J342 Deviated nasal septum: Secondary | ICD-10-CM | POA: Diagnosis not present

## 2015-07-17 DIAGNOSIS — G4733 Obstructive sleep apnea (adult) (pediatric): Secondary | ICD-10-CM | POA: Diagnosis not present

## 2015-07-19 ENCOUNTER — Other Ambulatory Visit: Payer: Self-pay | Admitting: *Deleted

## 2015-07-19 MED ORDER — WARFARIN SODIUM 5 MG PO TABS: ORAL_TABLET | ORAL | Status: DC

## 2015-07-24 DIAGNOSIS — I255 Ischemic cardiomyopathy: Secondary | ICD-10-CM | POA: Diagnosis not present

## 2015-07-24 DIAGNOSIS — Z95810 Presence of automatic (implantable) cardiac defibrillator: Secondary | ICD-10-CM | POA: Diagnosis not present

## 2015-07-24 DIAGNOSIS — I4891 Unspecified atrial fibrillation: Secondary | ICD-10-CM | POA: Diagnosis not present

## 2015-07-24 DIAGNOSIS — Z7901 Long term (current) use of anticoagulants: Secondary | ICD-10-CM | POA: Diagnosis not present

## 2015-08-03 DIAGNOSIS — S90222A Contusion of left lesser toe(s) with damage to nail, initial encounter: Secondary | ICD-10-CM | POA: Diagnosis not present

## 2015-08-03 DIAGNOSIS — Z95810 Presence of automatic (implantable) cardiac defibrillator: Secondary | ICD-10-CM | POA: Diagnosis not present

## 2015-08-03 DIAGNOSIS — M79672 Pain in left foot: Secondary | ICD-10-CM | POA: Diagnosis not present

## 2015-08-03 DIAGNOSIS — I255 Ischemic cardiomyopathy: Secondary | ICD-10-CM | POA: Diagnosis not present

## 2015-08-24 DIAGNOSIS — I255 Ischemic cardiomyopathy: Secondary | ICD-10-CM | POA: Diagnosis not present

## 2015-08-24 DIAGNOSIS — Z95810 Presence of automatic (implantable) cardiac defibrillator: Secondary | ICD-10-CM | POA: Diagnosis not present

## 2015-08-24 DIAGNOSIS — Z7901 Long term (current) use of anticoagulants: Secondary | ICD-10-CM | POA: Diagnosis not present

## 2015-09-21 DIAGNOSIS — I255 Ischemic cardiomyopathy: Secondary | ICD-10-CM | POA: Diagnosis not present

## 2015-09-21 DIAGNOSIS — Z7901 Long term (current) use of anticoagulants: Secondary | ICD-10-CM | POA: Diagnosis not present

## 2015-09-24 ENCOUNTER — Ambulatory Visit (INDEPENDENT_AMBULATORY_CARE_PROVIDER_SITE_OTHER): Payer: Medicare Other | Admitting: Internal Medicine

## 2015-09-24 ENCOUNTER — Encounter: Payer: Self-pay | Admitting: Internal Medicine

## 2015-09-24 VITALS — BP 140/76 | HR 72 | Temp 98.2°F | Resp 18 | Ht 69.5 in | Wt 258.0 lb

## 2015-09-24 DIAGNOSIS — I10 Essential (primary) hypertension: Secondary | ICD-10-CM | POA: Diagnosis not present

## 2015-09-24 DIAGNOSIS — E559 Vitamin D deficiency, unspecified: Secondary | ICD-10-CM | POA: Diagnosis not present

## 2015-09-24 DIAGNOSIS — E785 Hyperlipidemia, unspecified: Secondary | ICD-10-CM

## 2015-09-24 DIAGNOSIS — R7309 Other abnormal glucose: Secondary | ICD-10-CM | POA: Diagnosis not present

## 2015-09-24 DIAGNOSIS — R7303 Prediabetes: Secondary | ICD-10-CM

## 2015-09-24 DIAGNOSIS — Z79899 Other long term (current) drug therapy: Secondary | ICD-10-CM

## 2015-09-24 MED ORDER — LOSARTAN POTASSIUM 50 MG PO TABS: 50.0000 mg | ORAL_TABLET | Freq: Every day | ORAL | Status: DC

## 2015-09-24 MED ORDER — SPIRONOLACTONE 25 MG PO TABS: 25.0000 mg | ORAL_TABLET | Freq: Every day | ORAL | Status: DC

## 2015-09-24 MED ORDER — FUROSEMIDE 40 MG PO TABS: 40.0000 mg | ORAL_TABLET | Freq: Every day | ORAL | Status: DC

## 2015-09-24 MED ORDER — OMEPRAZOLE 20 MG PO CPDR: 20.0000 mg | DELAYED_RELEASE_CAPSULE | Freq: Two times a day (BID) | ORAL | Status: DC

## 2015-09-24 NOTE — Progress Notes (Signed)
Assessment and Plan:  Hypertension:  -discussed that we cannot stop lasix due to congestive heart failure -Continue medication -monitor blood pressure at home. -Continue DASH diet -Reminder to go to the ER if any CP, SOB, nausea, dizziness, severe HA, changes vision/speech, left arm numbness and tingling and jaw pain.  Cholesterol -given ASCD history patient should likely be on zetia - Continue diet and exercise -Check cholesterol.   Diabetes with diabetic chronic kidney disease -Continue diet and exercise.  -Check A1C  Vitamin D Def -check level -continue medications.   Continue diet and meds as discussed. Further disposition pending results of labs. Discussed med's effects and SE's.    HPI 75 y.o. male  presents for 3 month follow up with hypertension, hyperlipidemia, diabetes and vitamin D deficiency.   His blood pressure has been controlled at home, today their BP is BP: 140/76 mmHg.He does not workout. He denies chest pain, shortness of breath, dizziness. He is status post cabg with 3 vessels.  This was done 2 years ago.   He reports that he has a lot more energy than he used to. He reports that he does have an issue with how much he has to urinate.      He is on cholesterol medication and denies myalgias. His cholesterol is at goal. The cholesterol was:  04/23/2015: Cholesterol 160; HDL 19*; LDL Cholesterol 102; Triglycerides 193*.  He does note that sometimes he does have some muscle cramping in his legs.  He reports that moving them helps to get rid of this.     He has been working on diet and exercise for diabetes with diabetic chronic kidney disease, he is on bASA, he is on ACE/ARB, and denies  foot ulcerations, hyperglycemia, hypoglycemia , increased appetite, nausea, paresthesia of the feet, polydipsia, polyuria, visual disturbances, vomiting and weight loss. Last A1C was: 04/23/2015: Hgb A1c MFr Bld 6.0*.   [name] 01/02/2015: Vit D, 25-Hydroxy  39    Current Medications:  Current Outpatient Prescriptions on File Prior to Visit  Medication Sig Dispense Refill  . acetaminophen (TYLENOL) 500 MG tablet Take 500 mg by mouth every 6 (six) hours as needed. Takes 2 tab BID    . albuterol (PROVENTIL HFA;VENTOLIN HFA) 108 (90 BASE) MCG/ACT inhaler Inhale 2 puffs into the lungs every 6 (six) hours as needed for wheezing or shortness of breath.    . allopurinol (ZYLOPRIM) 300 MG tablet Take 1 tablet (300 mg total) by mouth daily. 90 tablet 3  . aspirin EC 81 MG tablet Take 81 mg by mouth daily.    Marland Kitchen atorvastatin (LIPITOR) 80 MG tablet Take 1/2 to 1 tablet daily or as directed for Cholesterol 90 tablet 11  . Cholecalciferol (VITAMIN D PO) Take 15,000 Units by mouth daily.     Marland Kitchen doxazosin (CARDURA) 4 MG tablet Take 1 tablet (4 mg total) by mouth daily. 90 tablet 4  . finasteride (PROSCAR) 5 MG tablet Take 1 tablet (5 mg total) by mouth daily. 90 tablet 3  . fluticasone (FLONASE) 50 MCG/ACT nasal spray Place into both nostrils daily. PRN    . furosemide (LASIX) 40 MG tablet     . levothyroxine (SYNTHROID, LEVOTHROID) 200 MCG tablet Take 1 tablet (200 mcg total) by mouth daily before breakfast. 90 tablet 3  . losartan (COZAAR) 50 MG tablet Take 50 mg by mouth daily.    . metoprolol succinate (TOPROL-XL) 50 MG 24 hr tablet Take 1 tablet (50 mg total) by  mouth daily. Take with or immediately following a meal. 90 tablet 4  . omeprazole (PRILOSEC) 20 MG capsule Take 20 mg by mouth 2 (two) times daily.    Marland Kitchen OVER THE COUNTER MEDICATION daily. Vitamin A to Z 50 plus    . OVER THE COUNTER MEDICATION Eye drops daily PRN    . polyethylene glycol (MIRALAX / GLYCOLAX) packet Take 17 g by mouth daily.    Marland Kitchen spironolactone (ALDACTONE) 25 MG tablet Take 25 mg by mouth daily.    Marland Kitchen warfarin (COUMADIN) 5 MG tablet Takes 7.5 mg daily or as directed. 145 tablet 1   No current facility-administered medications on file prior to visit.   Medical History:  Past  Medical History  Diagnosis Date  . Cataract     s/p left  . COPD (chronic obstructive pulmonary disease) ([place])     by CXR]  . Prediabetes 01/2014  . GERD (gastroesophageal reflux disease)   . Sleep apnea   . Hyperlipidemia   . Hypertension   . Thyroid disease     hypothyroid  . ASHD (arteriosclerotic heart disease) 2015    s/p CABG  . A-fib ([place]) 01/2014   Allergies: No Known Allergies   Review of Systems:  Review of Systems  Constitutional: Negative for fever, chills and malaise/fatigue.  HENT: Negative for congestion, ear pain and sore throat.   Eyes: Negative.   Respiratory: Negative for cough, shortness of breath and wheezing.   Cardiovascular: Negative for chest pain, palpitations, leg swelling and PND.  Gastrointestinal: Positive for constipation. Negative for heartburn, abdominal pain, diarrhea, blood in stool and melena.  Genitourinary: Negative.   Skin: Negative.   Neurological: Negative for dizziness, sensory change, loss of consciousness and headaches.  Psychiatric/Behavioral: Negative for depression. The patient is not nervous/anxious and does not have insomnia.     Family history- Review and unchanged  Social history- Review and unchanged  Physical Exam: BP 140/76 mmHg  Pulse 72  Temp(Src) 98.2 F (36.8 C) (Temporal)  Resp 18  Ht 5' 9.5" (1.765 m)  Wt 258 lb (117.028 kg)  BMI 37.57 kg/m2 Wt Readings from Last 3 Encounters:  09/24/15 258 lb (117.028 kg)  06/21/15 264 lb (119.75 kg)  04/23/15 261 lb (118.389 kg)   General Appearance: Well nourished well developed, non-toxic appearing, in no apparent distress. Eyes: PERRLA, EOMs, conjunctiva no swelling or erythema ENT/Mouth: Ear canals clear with no erythema, swelling, or discharge.  TMs normal bilaterally, oropharynx clear, moist, with no exudate.   Neck: Supple, thyroid normal, no JVD, no cervical adenopathy.  Respiratory: Respiratory effort normal, breath sounds clear A&P, no wheeze, rhonchi or rales  noted.  No retractions, no accessory muscle usage Cardio: RRR with no MRGs. No noted edema.  Abdomen: Soft, + BS.  Non tender, no guarding, rebound, hernias, masses. Musculoskeletal: Full ROM, 5/5 strength, Normal gait Skin: Warm, dry without rashes, lesions, ecchymosis.  Neuro: Awake and oriented X 3, Cranial nerves intact. No cerebellar symptoms.  Psych: normal affect, Insight and Judgment appropriate.    Starlyn Skeans, PA-C 3:55 PM [facility]

## 2015-09-25 LAB — CBC WITH DIFFERENTIAL/PLATELET
BASOS PCT: 2 %
Basophils Absolute: 192 cells/uL (ref 0–200)
Eosinophils Absolute: 192 cells/uL (ref 15–500)
Eosinophils Relative: 2 %
HCT: 40.8 % (ref 38.5–50.0)
Hemoglobin: 13.3 g/dL (ref 13.2–17.1)
Lymphocytes Relative: 14 %
Lymphs Abs: 1344 cells/uL (ref 850–3900)
MCH: 28.5 pg (ref 27.0–33.0)
MCHC: 32.6 g/dL (ref 32.0–36.0)
MCV: 87.4 fL (ref 80.0–100.0)
MONOS PCT: 5 %
MPV: 10.7 fL (ref 7.5–12.5)
Monocytes Absolute: 480 cells/uL (ref 200–950)
Neutro Abs: 7392 cells/uL (ref 1500–7800)
Neutrophils Relative %: 77 %
PLATELETS: 485 10*3/uL — AB (ref 140–400)
RBC: 4.67 MIL/uL (ref 4.20–5.80)
RDW: 20.1 % — ABNORMAL HIGH (ref 11.0–15.0)
WBC: 9.6 10*3/uL (ref 3.8–10.8)

## 2015-09-25 LAB — MAGNESIUM: Magnesium: 1.7 mg/dL (ref 1.5–2.5)

## 2015-09-25 LAB — BASIC METABOLIC PANEL WITH GFR
BUN: 23 mg/dL (ref 7–25)
CALCIUM: 10 mg/dL (ref 8.6–10.3)
CHLORIDE: 100 mmol/L (ref 98–110)
CO2: 31 mmol/L (ref 20–31)
Creat: 1.05 mg/dL (ref 0.70–1.18)
GFR, EST AFRICAN AMERICAN: 80 mL/min (ref >=60)
GFR, EST NON AFRICAN AMERICAN: 70 mL/min (ref >=60)
GLUCOSE: 104 mg/dL — AB (ref 65–99)
POTASSIUM: 4.8 mmol/L (ref 3.5–5.3)
Sodium: 138 mmol/L (ref 135–146)

## 2015-09-25 LAB — LIPID PANEL
CHOL/HDL RATIO: 8.4 ratio — AB (ref <=5.0)
Cholesterol: 152 mg/dL (ref 125–200)
HDL: 18 mg/dL — AB (ref >=40)
LDL CALC: 89 mg/dL (ref <130)
Triglycerides: 224 mg/dL — ABNORMAL HIGH (ref <150)
VLDL: 45 mg/dL — ABNORMAL HIGH (ref <30)

## 2015-09-25 LAB — HEPATIC FUNCTION PANEL
ALK PHOS: 47 U/L (ref 40–115)
ALT: 24 U/L (ref 9–46)
AST: 27 U/L (ref 10–35)
Albumin: 4.8 g/dL (ref 3.6–5.1)
BILIRUBIN DIRECT: 0.1 mg/dL (ref <=0.2)
BILIRUBIN INDIRECT: 0.6 mg/dL (ref 0.2–1.2)
BILIRUBIN TOTAL: 0.7 mg/dL (ref 0.2–1.2)
Total Protein: 7.1 g/dL (ref 6.1–8.1)

## 2015-09-25 LAB — TSH: TSH: 2.5 m[IU]/L (ref 0.40–4.50)

## 2015-09-25 LAB — VITAMIN D 25 HYDROXY (VIT D DEFICIENCY, FRACTURES): Vit D, 25-Hydroxy: 78 ng/mL (ref 30–100)

## 2015-09-25 LAB — HEMOGLOBIN A1C
HEMOGLOBIN A1C: 5.9 % — AB (ref <5.7)
MEAN PLASMA GLUCOSE: 123 mg/dL

## 2015-10-05 DIAGNOSIS — Z95810 Presence of automatic (implantable) cardiac defibrillator: Secondary | ICD-10-CM | POA: Diagnosis not present

## 2015-10-05 DIAGNOSIS — Z7901 Long term (current) use of anticoagulants: Secondary | ICD-10-CM | POA: Diagnosis not present

## 2015-10-05 DIAGNOSIS — I4891 Unspecified atrial fibrillation: Secondary | ICD-10-CM | POA: Diagnosis not present

## 2015-10-05 DIAGNOSIS — I255 Ischemic cardiomyopathy: Secondary | ICD-10-CM | POA: Diagnosis not present

## 2015-10-24 DIAGNOSIS — Z95810 Presence of automatic (implantable) cardiac defibrillator: Secondary | ICD-10-CM | POA: Diagnosis not present

## 2015-10-24 DIAGNOSIS — I255 Ischemic cardiomyopathy: Secondary | ICD-10-CM | POA: Diagnosis not present

## 2015-10-24 DIAGNOSIS — Z7901 Long term (current) use of anticoagulants: Secondary | ICD-10-CM | POA: Diagnosis not present

## 2015-10-24 DIAGNOSIS — I4891 Unspecified atrial fibrillation: Secondary | ICD-10-CM | POA: Diagnosis not present

## 2015-11-06 DIAGNOSIS — Z4502 Encounter for adjustment and management of automatic implantable cardiac defibrillator: Secondary | ICD-10-CM | POA: Diagnosis not present

## 2015-11-06 DIAGNOSIS — I255 Ischemic cardiomyopathy: Secondary | ICD-10-CM | POA: Diagnosis not present

## 2015-12-04 DIAGNOSIS — I4891 Unspecified atrial fibrillation: Secondary | ICD-10-CM | POA: Diagnosis not present

## 2015-12-04 DIAGNOSIS — I255 Ischemic cardiomyopathy: Secondary | ICD-10-CM | POA: Diagnosis not present

## 2015-12-04 DIAGNOSIS — Z95810 Presence of automatic (implantable) cardiac defibrillator: Secondary | ICD-10-CM | POA: Diagnosis not present

## 2015-12-04 DIAGNOSIS — Z7901 Long term (current) use of anticoagulants: Secondary | ICD-10-CM | POA: Diagnosis not present

## 2015-12-06 ENCOUNTER — Ambulatory Visit (INDEPENDENT_AMBULATORY_CARE_PROVIDER_SITE_OTHER): Payer: Medicare Other | Admitting: *Deleted

## 2015-12-06 DIAGNOSIS — H6123 Impacted cerumen, bilateral: Secondary | ICD-10-CM

## 2016-01-01 DIAGNOSIS — Z7901 Long term (current) use of anticoagulants: Secondary | ICD-10-CM | POA: Diagnosis not present

## 2016-01-01 DIAGNOSIS — I4891 Unspecified atrial fibrillation: Secondary | ICD-10-CM | POA: Diagnosis not present

## 2016-01-01 DIAGNOSIS — I255 Ischemic cardiomyopathy: Secondary | ICD-10-CM | POA: Diagnosis not present

## 2016-01-01 DIAGNOSIS — Z95810 Presence of automatic (implantable) cardiac defibrillator: Secondary | ICD-10-CM | POA: Diagnosis not present

## 2016-01-03 ENCOUNTER — Encounter: Payer: Self-pay | Admitting: Internal Medicine

## 2016-01-03 ENCOUNTER — Ambulatory Visit (INDEPENDENT_AMBULATORY_CARE_PROVIDER_SITE_OTHER): Payer: Medicare Other | Admitting: Internal Medicine

## 2016-01-03 VITALS — BP 120/60 | HR 88 | Temp 97.3°F | Resp 16 | Ht 69.0 in | Wt 259.8 lb

## 2016-01-03 DIAGNOSIS — I4891 Unspecified atrial fibrillation: Secondary | ICD-10-CM | POA: Diagnosis not present

## 2016-01-03 DIAGNOSIS — I10 Essential (primary) hypertension: Secondary | ICD-10-CM

## 2016-01-03 DIAGNOSIS — Z79899 Other long term (current) drug therapy: Secondary | ICD-10-CM | POA: Diagnosis not present

## 2016-01-03 DIAGNOSIS — E039 Hypothyroidism, unspecified: Secondary | ICD-10-CM | POA: Diagnosis not present

## 2016-01-03 DIAGNOSIS — E559 Vitamin D deficiency, unspecified: Secondary | ICD-10-CM | POA: Diagnosis not present

## 2016-01-03 DIAGNOSIS — N320 Bladder-neck obstruction: Secondary | ICD-10-CM

## 2016-01-03 DIAGNOSIS — R7303 Prediabetes: Secondary | ICD-10-CM | POA: Diagnosis not present

## 2016-01-03 DIAGNOSIS — I2510 Atherosclerotic heart disease of native coronary artery without angina pectoris: Secondary | ICD-10-CM

## 2016-01-03 DIAGNOSIS — Z1211 Encounter for screening for malignant neoplasm of colon: Secondary | ICD-10-CM

## 2016-01-03 DIAGNOSIS — Z136 Encounter for screening for cardiovascular disorders: Secondary | ICD-10-CM

## 2016-01-03 DIAGNOSIS — Z125 Encounter for screening for malignant neoplasm of prostate: Secondary | ICD-10-CM

## 2016-01-03 DIAGNOSIS — E782 Mixed hyperlipidemia: Secondary | ICD-10-CM

## 2016-01-03 DIAGNOSIS — K219 Gastro-esophageal reflux disease without esophagitis: Secondary | ICD-10-CM

## 2016-01-03 DIAGNOSIS — G4733 Obstructive sleep apnea (adult) (pediatric): Secondary | ICD-10-CM

## 2016-01-03 LAB — CBC WITH DIFFERENTIAL/PLATELET
BASOS ABS: 86 {cells}/uL (ref 0–200)
Basophils Relative: 1 %
EOS ABS: 172 {cells}/uL (ref 15–500)
Eosinophils Relative: 2 %
HCT: 39.9 % (ref 38.5–50.0)
Hemoglobin: 12.8 g/dL — ABNORMAL LOW (ref 13.2–17.1)
LYMPHS PCT: 14 %
Lymphs Abs: 1204 cells/uL (ref 850–3900)
MCH: 27.9 pg (ref 27.0–33.0)
MCHC: 32.1 g/dL (ref 32.0–36.0)
MCV: 86.9 fL (ref 80.0–100.0)
MONOS PCT: 3 %
MPV: 10.9 fL (ref 7.5–12.5)
Monocytes Absolute: 258 cells/uL (ref 200–950)
NEUTROS PCT: 80 %
Neutro Abs: 6880 cells/uL (ref 1500–7800)
PLATELETS: 480 10*3/uL — AB (ref 140–400)
RBC: 4.59 MIL/uL (ref 4.20–5.80)
RDW: 19.6 % — ABNORMAL HIGH (ref 11.0–15.0)
WBC: 8.6 10*3/uL (ref 3.8–10.8)

## 2016-01-03 LAB — HEMOGLOBIN A1C
HEMOGLOBIN A1C: 5.7 % — AB (ref <5.7)
MEAN PLASMA GLUCOSE: 117 mg/dL

## 2016-01-03 LAB — PSA: PSA: 0.1 ng/mL (ref <=4.0)

## 2016-01-03 LAB — TSH: TSH: 2.56 m[IU]/L (ref 0.40–4.50)

## 2016-01-03 NOTE — Patient Instructions (Signed)

## 2016-01-03 NOTE — Progress Notes (Signed)
[place] [facility]   [name], M.D.    Uvaldo Bristle. [name], P.A.-C      Starlyn Skeans, P.A.-C   [facility]                [address] [place] [id number] Telephone [phone] Telefax [phone]  entative Visit And Comprehensive Evaluation & Examination     This very nice 75y.o.MWM presents for a Wellness/Preventative Visit & comprehensive evaluation and management of multiple medical co-morbidities.  Patient has been followed for HTN, Prediabetes, Hyperlipidemia and Vitamin D Deficiency. Other problems include GERD which is controlled with diet & his meds.     HTN predates since 41 (age 89 yo). Today's BP: 120/60. Patient underwent 3V CABG in July 2015and had po Afib failing CV x2 and has been on Coumadin since. Patient had an AICD (defib) implanted in Sept 2015. Patient is being followed by a cardiologist in [place] for his Coumadin & PPM checks. Patient denies any cardiac symptoms as chest pain, palpitations, shortness of breath, dizziness or ankle swelling.     Patient's hyperlipidemia is controlled with diet and medications. Patient denies myalgias or other medication SE's. Last lipids were at goal with elev Trig's.  Lab Results  Component Value Date   CHOL 152 09/24/2015   HDL 18 (L) 09/24/2015   LDLCALC 89 09/24/2015   TRIG 224 (H) 09/24/2015   CHOLHDL 8.4 (H) 09/24/2015      Patient has prediabetes in July 2015 with A1c 6.0%  and patient denies reactive hypoglycemic symptoms, visual blurring, diabetic polys or paresthesias. Last A1c was not at goal of less than 5.7%.  Lab Results  Component Value Date   HGBA1C 5.9 (H) 09/24/2015       The patient is also on thyroid replacement. Finally, patient has history of Vitamin D Deficiency of "6" in 12/2014 and last vitamin D was at goal:  Lab Results  Component Value Date   VD25OH 78 09/24/2015   Current Outpatient Prescriptions on File  Prior to Visit  Medication Sig  . acetaminophen (TYLENOL) 500 MG tablet Take 500 mg by mouth every 6 (six) hours as needed. Takes 2 tab BID  . VENTOLIN HFA  Inhale 2 puffsevery 6 hours as needed   . allopurinol  300 MG tablet Take 1 tablet (300 mg total) by mouth daily.  Marland Kitchen aspirin EC 81 MG tablet Take 81 mg by mouth daily.  Marland Kitchen atorvastatin  80 MG tablet Take 1/2 to 1 tablet daily or as directed   . VITAMIN D  Take 15,000 Units by mouth daily.   Marland Kitchen doxazosin  4 MG tablet Take 1 tablet (4 mg total) by mouth daily.  . finasteride  5 MG tablet Take 1 tablet (5 mg total) by mouth daily.  Marland Kitchen FLONASE  nasal spray Place into both nostrils daily. PRN  . furosemide (LASIX) 40 MG tablet Take 1 tablet (40 mg total) by mouth daily.  Marland Kitchen levothyroxine  200 MCG tablet Take 1 tablet (200 mcg total) by mouth daily  . losartan 50 MG tablet Take 1 tablet (50 mg total) by mouth daily.  . metoprolol succinate-XL 50 MG  Take 1 tablet (50 mg total) by mouth daily.   Marland Kitchen omeprazole 20 MG Take 1 capsule (20 mg total) by mouth 2 (two) times daily.  Marland Kitchen OVER THE COUNTER MEDICATION daily. Vitamin A to  Z 50 plus  . OVER THE COUNTER MEDICATION Eye drops daily PRN  . MIRALAX   Take 17 g by mouth daily.  Marland Kitchen spironolactone  25 MG tablet Take 1 tablet (25 mg total) by mouth daily.  Marland Kitchen warfarin (COUMADIN) 5 MG tablet Takes 7.5 mg daily or as directed.   No current facility-administered medications on file prior to visit.    No Known Allergies Past Medical History:  Diagnosis Date  . A-fib ([place]) 01/2014  . ASHD (arteriosclerotic heart disease) 2015   s/p CABG  . Cataract    s/p left  . COPD (chronic obstructive pulmonary disease) ([place])    by CXR]  . GERD (gastroesophageal reflux disease)   . Hyperlipidemia   . Hypertension   . Prediabetes 01/2014  . Sleep apnea   . Thyroid disease    hypothyroid   Health Maintenance  Topic Date Due  . TETANUS/TDAP  09/25/1959  . ZOSTAVAX  09/24/2000  . PNA vac Low Risk Adult (2 of 2 -  PPSV23) 11/21/2015  . INFLUENZA VACCINE  12/18/2015  . COLONOSCOPY  11/03/2017   Immunization History  Administered Date(s) Administered  . Influenza, High Dose Seasonal PF 04/23/2015  . Pneumococcal Conjugate-13 11/21/2014   Past Surgical History:  Procedure Laterality Date  . CARDIAC DEFIBRILLATOR PLACEMENT  07/2014  . CORONARY ARTERY BYPASS GRAFT  11/2013  . lumb laminectomy  [phone]   x 3  . LUMBAR FUSION  2013   Family History  Problem Relation Age of Onset  . Alzheimer's disease Mother   . Mental illness Mother   . Depression Mother   . Heart disease Father 40    had 5 MI's & died 67 yo  . Alcohol abuse Son   . Stroke Maternal Grandmother    Social History   Social History  . Marital status: Married    Spouse name: N/A  . Number of children: N/A  . Years of education: N/A   Occupational History  . Retired   Social History Main Topics  . Smoking status: Former Smoker    Quit date: 05/20/1983  . Smokeless tobacco: Not on file  . Alcohol use No  . Drug use: Unknown  . Sexual activity: Not on file    ROS Constitutional: Denies fever, chills, weight loss/gain, headaches, insomnia,  night sweats or change in appetite. Does c/o fatigue. Eyes: Denies redness, blurred vision, diplopia, discharge, itchy or watery eyes.  ENT: Denies discharge, congestion, post nasal drip, epistaxis, sore throat, earache, hearing loss, dental pain, Tinnitus, Vertigo, Sinus pain or snoring.  Cardio: Denies chest pain, palpitations, irregular heartbeat, syncope, dyspnea, diaphoresis, orthopnea, PND, claudication or edema Respiratory: denies cough, dyspnea, DOE, pleurisy, hoarseness, laryngitis or wheezing.  Gastrointestinal: Denies dysphagia, heartburn, reflux, water brash, pain, cramps, nausea, vomiting, bloating, diarrhea, constipation, hematemesis, melena, hematochezia, jaundice or hemorrhoids Genitourinary: Denies dysuria, frequency, urgency, nocturia, hesitancy, discharge,  hematuria or flank pain Musculoskeletal: Denies arthralgia, myalgia, stiffness, Jt. Swelling, pain, limp or strain/sprain. Denies Falls. Skin: Denies puritis, rash, hives, warts, acne, eczema or change in skin lesion Neuro: No weakness, tremor, incoordination, spasms, paresthesia or pain Psychiatric: Denies confusion, memory loss or sensory loss. Denies Depression. Endocrine: Denies change in weight, skin, hair change, nocturia, and paresthesia, diabetic polys, visual blurring or hyper / hypo glycemic episodes.  Heme/Lymph: No excessive bleeding, bruising or enlarged lymph nodes.  Physical Exam  BP 120/60   Pulse 88   Temp 97.3 F (36.3 C)   Resp 16   Ht  5\' 9"  (1.753 m)   Wt 259 lb 12.8 oz (117.8 kg)   SpO2 96%   BMI 38.37 kg/m   General Appearance: Over nourished, sedentary & in no apparent distress.  Eyes: PERRLA, EOMs, conjunctiva no swelling or erythema, mild Cat's ou, normal fundi and vessels. Sinuses: No frontal/maxillary tenderness ENT/Mouth: EACs patent / TMs  nl. Nares clear without erythema, swelling, mucoid exudates. Oral hygiene is good. No erythema, swelling, or exudate. Tongue normal, non-obstructing. Tonsils not swollen or erythematous. Hearing normal.  Neck: Supple, thyroid normal. No bruits, nodes or JVD. Respiratory: Respiratory effort normal.  BS equal and clear bilateral without rales, rhonci, wheezing or stridor. Cardio: Heart sounds are normal with sl irregular rate and rhythm and no murmurs, rubs or gallops. Peripheral pulses are normal and equal bilaterally without edema. No aortic or femoral bruits. Chest: Sl barrel configured w/ median sternotomy scar.  Abdomen: Soft, with Nl bowel sounds. Nontender, no guarding, rebound, hernias, masses, or organomegaly.  Lymphatics: Non tender without lymphadenopathy.  Genitourinary: No hernias. Testes nl. DRE - prostate nl for age - smooth & firm w/o nodules. Musculoskeletal: Full ROM all peripheral extremities, joint  stability, 5/5 strength, and normal gait. Skin: Warm and dry without rashes, lesions, cyanosis, clubbing or  ecchymosis.  Neuro: Cranial nerves intact, reflexes equal bilaterally. Normal muscle tone, no cerebellar symptoms. Sensation intact.  Pysch: Alert and oriented X 3 with normal affect, insight and judgment appropriate.   Assessment and Plan  1. Essential hypertension  - Microalbumin / creatinine urine ratio - [place], RETROPERITNL ABD,  LTD - TSH  2. Mixed hyperlipidemia  - [place], RETROPERITNL ABD,  LTD - Lipid panel - TSH  3. Prediabetes  - [place], RETROPERITNL ABD,  LTD - Hemoglobin A1c - Insulin, random  4. Vitamin D deficiency  - VITAMIN D 25 Hydroxy   5. ASHD/CABG x 3V (11/2013)   6. Atrial fibrillation, unspecified type ([place])   7. Hypothyroidism  - TSH  8. OSA (obstructive sleep apnea)   9. Gastroesophageal reflux disease   10. Colon cancer screening  - POC Hemoccult Bld/Stl   11. Prostate cancer screening  - PSA  12. Screening for ischemic heart disease   13. Screening for AAA (aortic abdominal aneurysm)  - [place], RETROPERITNL ABD,  LTD  14. Medication management  - Urinalysis, Routine w reflex microscopic ( - Uric acid - CBC with Differential/Platelet - BASIC METABOLIC PANEL WITH GFR - Hepatic function panel - Magnesium  15. Bladder neck obstruction  - PSA     Continue prudent diet as discussed, weight control, BP monitoring, regular exercise, and medications as discussed.  Discussed med effects and SE's. Routine screening labs and tests as requested with regular follow-up as recommended. Over 40 minutes of exam, counseling, chart review and high complex critical decision making was performed

## 2016-01-04 LAB — LIPID PANEL
Cholesterol: 138 mg/dL (ref 125–200)
HDL: 17 mg/dL — AB (ref >=40)
LDL Cholesterol: 67 mg/dL (ref <130)
Total CHOL/HDL Ratio: 8.1 Ratio — ABNORMAL HIGH (ref <=5.0)
Triglycerides: 268 mg/dL — ABNORMAL HIGH (ref <150)
VLDL: 54 mg/dL — ABNORMAL HIGH (ref <30)

## 2016-01-04 LAB — MAGNESIUM: MAGNESIUM: 1.8 mg/dL (ref 1.5–2.5)

## 2016-01-04 LAB — URINALYSIS, ROUTINE W REFLEX MICROSCOPIC
Bilirubin Urine: NEGATIVE
Glucose, UA: NEGATIVE
Hgb urine dipstick: NEGATIVE
Ketones, ur: NEGATIVE
Leukocytes, UA: NEGATIVE
Nitrite: NEGATIVE
Protein, ur: NEGATIVE
Specific Gravity, Urine: 1.007 (ref 1.001–1.035)
pH: 6.5 (ref 5.0–8.0)

## 2016-01-04 LAB — VITAMIN D 25 HYDROXY (VIT D DEFICIENCY, FRACTURES): VIT D 25 HYDROXY: 71 ng/mL (ref 30–100)

## 2016-01-04 LAB — BASIC METABOLIC PANEL WITH GFR
BUN: 23 mg/dL (ref 7–25)
CHLORIDE: 104 mmol/L (ref 98–110)
CO2: 25 mmol/L (ref 20–31)
CREATININE: 1.14 mg/dL (ref 0.70–1.18)
Calcium: 9.6 mg/dL (ref 8.6–10.3)
GFR, Est African American: 72 mL/min (ref >=60)
GFR, Est Non African American: 63 mL/min (ref >=60)
Glucose, Bld: 126 mg/dL — ABNORMAL HIGH (ref 65–99)
Potassium: 4.3 mmol/L (ref 3.5–5.3)
Sodium: 140 mmol/L (ref 135–146)

## 2016-01-04 LAB — INSULIN, RANDOM: INSULIN: 25.4 u[IU]/mL — AB (ref 2.0–19.6)

## 2016-01-04 LAB — MICROALBUMIN / CREATININE URINE RATIO
Creatinine, Urine: 30 mg/dL (ref 20–370)
Microalb Creat Ratio: 13 mcg/mg creat (ref <30)
Microalb, Ur: 0.4 mg/dL

## 2016-01-04 LAB — HEPATIC FUNCTION PANEL
ALBUMIN: 4.5 g/dL (ref 3.6–5.1)
ALT: 21 U/L (ref 9–46)
AST: 30 U/L (ref 10–35)
Alkaline Phosphatase: 46 U/L (ref 40–115)
BILIRUBIN TOTAL: 0.8 mg/dL (ref 0.2–1.2)
Bilirubin, Direct: 0.2 mg/dL (ref <=0.2)
Indirect Bilirubin: 0.6 mg/dL (ref 0.2–1.2)
Total Protein: 6.8 g/dL (ref 6.1–8.1)

## 2016-01-04 LAB — URIC ACID: Uric Acid, Serum: 5.8 mg/dL (ref 4.0–8.0)

## 2016-01-22 ENCOUNTER — Encounter (HOSPITAL_COMMUNITY): Payer: Self-pay

## 2016-01-22 NOTE — Pre-Procedure Instructions (Signed)
Tomio Lamia Biddy  01/22/2016      [facility] - [place], [place] [address] Phone: [phone] Fax: [phone]  [place] [address], [place] [address] Phone: [phone] Fax: [phone]    Your procedure is scheduled on Wednesday September 13.  Report to [facility] Admitting at 10:30 A.M.  Call this number if you have problems the morning of surgery:  [phone]   Remember:  Do not eat food or drink liquids after midnight.  Take these medicines the morning of surgery with A SIP OF WATER: tylenol if needed, albuterol inhaler if needed, allopurinol (zyloprim), doxazosin (cardura), finesteride (proscar), flonase if needed, levothyroxine (synthroid), metoprolol (toprol-XL), omeprazole (prilosec)  7 days prior to surgery STOP taking any Aspirin, Aleve, Naproxen, Ibuprofen, Motrin, Advil, Goody's, BC's, all herbal medications, fish oil, and all vitamins    Do not wear jewelry  Do not wear lotions, powders, colognes, or deoderant.  Men may shave face and neck.  Do not bring valuables to the hospital.  [facility] is not responsible for any belongings or valuables.  Contacts, dentures or bridgework may not be worn into surgery.  Leave your suitcase in the car.  After surgery it may be brought to your room.  For patients admitted to the hospital, discharge time will be determined by your treatment team.  Patients discharged the day of surgery will not be allowed to drive home.   Special instructions:     [facility]- Preparing For Surgery  Before surgery, you can play an important role. Because skin is not sterile, your skin needs to be as free of germs as possible. You can reduce the number of germs on your skin by washing with CHG (chlorahexidine gluconate) Soap before surgery.  CHG is an antiseptic cleaner which kills  germs and bonds with the skin to continue killing germs even after washing.  Please do not use if you have an allergy to CHG or antibacterial soaps. If your skin becomes reddened/irritated stop using the CHG.  Do not shave (including legs and underarms) for at least 48 hours prior to first CHG shower. It is OK to shave your face.  Please follow these instructions carefully.   1. Shower the NIGHT BEFORE SURGERY and the MORNING OF SURGERY with CHG.   2. If you chose to wash your hair, wash your hair first as usual with your normal shampoo.  3. After you shampoo, rinse your hair and body thoroughly to remove the shampoo.  4. Use CHG as you would any other liquid soap. You can apply CHG directly to the skin and wash gently with a scrungie or a clean washcloth.   5. Apply the CHG Soap to your body ONLY FROM THE NECK DOWN.  Do not use on open wounds or open sores. Avoid contact with your eyes, ears, mouth and genitals (private parts). Wash genitals (private parts) with your normal soap.  6. Wash thoroughly, paying special attention to the area where your surgery will be performed.  7. Thoroughly rinse your body with warm water from the neck down.  8. DO NOT shower/wash with your normal soap after using and rinsing off the CHG Soap.  9. Pat yourself dry with a CLEAN TOWEL.   10. Wear CLEAN PAJAMAS   11. Place CLEAN SHEETS on your bed the night of your first shower  and DO NOT SLEEP WITH PETS.    Day of Surgery: Do not apply any deodorants/lotions. Please wear clean clothes to the hospital/surgery center.

## 2016-01-23 ENCOUNTER — Encounter (HOSPITAL_COMMUNITY)
Admission: RE | Admit: 2016-01-23 | Discharge: 2016-01-23 | Disposition: A | Discharge status: Home / Self Care | Payer: Medicare Other | Source: Ambulatory Visit | Attending: Otolaryngology | Admitting: Otolaryngology

## 2016-01-23 ENCOUNTER — Other Ambulatory Visit: Payer: Self-pay | Admitting: Otolaryngology

## 2016-01-23 ENCOUNTER — Encounter (HOSPITAL_COMMUNITY): Payer: Self-pay

## 2016-01-23 DIAGNOSIS — I2510 Atherosclerotic heart disease of native coronary artery without angina pectoris: Secondary | ICD-10-CM | POA: Insufficient documentation

## 2016-01-23 DIAGNOSIS — E785 Hyperlipidemia, unspecified: Secondary | ICD-10-CM | POA: Diagnosis not present

## 2016-01-23 DIAGNOSIS — J343 Hypertrophy of nasal turbinates: Secondary | ICD-10-CM | POA: Insufficient documentation

## 2016-01-23 DIAGNOSIS — I10 Essential (primary) hypertension: Secondary | ICD-10-CM | POA: Insufficient documentation

## 2016-01-23 DIAGNOSIS — K219 Gastro-esophageal reflux disease without esophagitis: Secondary | ICD-10-CM | POA: Insufficient documentation

## 2016-01-23 DIAGNOSIS — Z95810 Presence of automatic (implantable) cardiac defibrillator: Secondary | ICD-10-CM | POA: Diagnosis not present

## 2016-01-23 DIAGNOSIS — Z01812 Encounter for preprocedural laboratory examination: Secondary | ICD-10-CM | POA: Insufficient documentation

## 2016-01-23 DIAGNOSIS — I4891 Unspecified atrial fibrillation: Secondary | ICD-10-CM | POA: Diagnosis not present

## 2016-01-23 DIAGNOSIS — Z01818 Encounter for other preprocedural examination: Secondary | ICD-10-CM | POA: Insufficient documentation

## 2016-01-23 DIAGNOSIS — G4733 Obstructive sleep apnea (adult) (pediatric): Secondary | ICD-10-CM | POA: Diagnosis not present

## 2016-01-23 DIAGNOSIS — J342 Deviated nasal septum: Secondary | ICD-10-CM | POA: Diagnosis not present

## 2016-01-23 DIAGNOSIS — R7303 Prediabetes: Secondary | ICD-10-CM | POA: Insufficient documentation

## 2016-01-23 DIAGNOSIS — J449 Chronic obstructive pulmonary disease, unspecified: Secondary | ICD-10-CM | POA: Insufficient documentation

## 2016-01-23 DIAGNOSIS — Z87891 Personal history of nicotine dependence: Secondary | ICD-10-CM | POA: Diagnosis not present

## 2016-01-23 DIAGNOSIS — E039 Hypothyroidism, unspecified: Secondary | ICD-10-CM | POA: Insufficient documentation

## 2016-01-23 DIAGNOSIS — Z7901 Long term (current) use of anticoagulants: Secondary | ICD-10-CM | POA: Insufficient documentation

## 2016-01-23 DIAGNOSIS — Z79899 Other long term (current) drug therapy: Secondary | ICD-10-CM | POA: Diagnosis not present

## 2016-01-23 DIAGNOSIS — Z7982 Long term (current) use of aspirin: Secondary | ICD-10-CM | POA: Insufficient documentation

## 2016-01-23 DIAGNOSIS — Z951 Presence of aortocoronary bypass graft: Secondary | ICD-10-CM | POA: Diagnosis not present

## 2016-01-23 HISTORY — DX: Left bundle-branch block, unspecified: I44.7

## 2016-01-23 HISTORY — DX: Unspecified osteoarthritis, unspecified site: M19.90

## 2016-01-23 HISTORY — DX: Presence of automatic (implantable) cardiac defibrillator: Z95.810

## 2016-01-23 HISTORY — DX: Prediabetes: R73.03

## 2016-01-23 HISTORY — DX: Presence of cardiac pacemaker: Z95.0

## 2016-01-23 HISTORY — DX: Hypothyroidism, unspecified: E03.9

## 2016-01-23 LAB — BASIC METABOLIC PANEL
ANION GAP: 7 (ref 5–15)
BUN: 20 mg/dL (ref 6–20)
CALCIUM: 9.6 mg/dL (ref 8.9–10.3)
CO2: 25 mmol/L (ref 22–32)
Chloride: 106 mmol/L (ref 101–111)
Creatinine, Ser: 0.96 mg/dL (ref 0.61–1.24)
GLUCOSE: 124 mg/dL — AB (ref 65–99)
POTASSIUM: 4.3 mmol/L (ref 3.5–5.1)
SODIUM: 138 mmol/L (ref 135–145)

## 2016-01-23 LAB — CBC
HEMATOCRIT: 40.3 % (ref 39.0–52.0)
HEMOGLOBIN: 12.6 g/dL — AB (ref 13.0–17.0)
MCH: 28.2 pg (ref 26.0–34.0)
MCHC: 31.3 g/dL (ref 30.0–36.0)
MCV: 90.2 fL (ref 78.0–100.0)
Platelets: 435 10*3/uL — ABNORMAL HIGH (ref 150–400)
RBC: 4.47 MIL/uL (ref 4.22–5.81)
RDW: 19.6 % — ABNORMAL HIGH (ref 11.5–15.5)
WBC: 8.9 10*3/uL (ref 4.0–10.5)

## 2016-01-23 NOTE — H&P (Signed)
PREOPERATIVE H&P  Chief Complaint: nasal obstruction and trouble using nasal CPAP  HPI: [name] [name] is a 75 y.o. male who presents for evaluation of nasal obstruction secondary to deviated septum and large turbinates. He has a long history of OSA but doesn't tolerate using nasal CPAP because of difficulty breathing through his nose. He also has history of cardiac disease and A Fib and is on coumadin. He's been cleared by his cardiologist and is OK to stop coumadin for 5 days. He's taken to the OR for septoplasty , TR and uvulectomy.  Past Medical History:  Diagnosis Date  . A-fib ([place]) 01/2014  . AICD (automatic cardioverter/defibrillator) present    [employer]  . Arthritis   . ASHD (arteriosclerotic heart disease) 2015   s/p CABG  . Cataract    s/p left  . COPD (chronic obstructive pulmonary disease) ([place])    by CXR, pt unsure of this  . GERD (gastroesophageal reflux disease)   . Hyperlipidemia   . Hypertension   . Hypothyroidism   . Pre-diabetes   . Prediabetes 01/2014  . Presence of permanent cardiac pacemaker   . Sleep apnea    no CPAP  . Thyroid disease    hypothyroid   Past Surgical History:  Procedure Laterality Date  . CARDIAC DEFIBRILLATOR PLACEMENT  07/2014  . CATARACT EXTRACTION W/ INTRAOCULAR LENS IMPLANT Left   . CORONARY ARTERY BYPASS GRAFT  11/2013  . lumb laminectomy  [phone]   x 3  . LUMBAR FUSION  2013   Social History   Social History  . Marital status: Married    Spouse name: N/A  . Number of children: N/A  . Years of education: N/A   Social History Main Topics  . Smoking status: Former Smoker    Quit date: 05/20/1983  . Smokeless tobacco: Never Used  . Alcohol use No  . Drug use: No  . Sexual activity: Not on file   Other Topics Concern  . Not on file   Social History Narrative  . No narrative on file   Family History  Problem Relation Age of Onset  . Alzheimer's disease Mother   . Mental illness Mother   . Depression  Mother   . Heart disease Father 34    had 5 MI's & died 42 yo  . Alcohol abuse Son   . Stroke Maternal Grandmother    Allergies  Allergen Reactions  . Other Swelling    Sodium sensitive   Prior to Admission medications   Medication Sig Start Date End Date Taking? Authorizing Provider  acetaminophen (TYLENOL) 500 MG tablet Take 500 mg by mouth every 6 (six) hours as needed for mild pain.    Yes Historical Provider, MD  albuterol (PROVENTIL HFA;VENTOLIN HFA) 108 (90 BASE) MCG/ACT inhaler Inhale 2 puffs into the lungs every 6 (six) hours as needed for wheezing or shortness of breath.   Yes Historical Provider, MD  allopurinol (ZYLOPRIM) 300 MG tablet Take 1 tablet (300 mg total) by mouth daily. 04/30/15  Yes Vicie Mutters, PA-C  aspirin EC 81 MG tablet Take 81 mg by mouth daily.   Yes Historical Provider, MD  atorvastatin (LIPITOR) 80 MG tablet Take 1/2 to 1 tablet daily or as directed for Cholesterol Patient taking differently: Take 40 mg by mouth daily at 6 PM.  01/03/15 01/17/17 Yes [name], MD  Black Pepper-Turmeric (TURMERIC COMPLEX/BLACK PEPPER PO) Take 1 capsule by mouth daily.   Yes Historical Provider, MD  Cholecalciferol (VITAMIN  D PO) Take 15,000 Units by mouth daily.    Yes Historical Provider, MD  diphenhydrAMINE (BENADRYL) 25 MG tablet Take 25 mg by mouth at bedtime.   Yes Historical Provider, MD  docusate sodium (COLACE) 100 MG capsule Take 100 mg by mouth 2 (two) times daily.   Yes Historical Provider, MD  doxazosin (CARDURA) 4 MG tablet Take 1 tablet (4 mg total) by mouth daily. 04/23/15 04/22/16 Yes Vicie Mutters, PA-C  finasteride (PROSCAR) 5 MG tablet Take 1 tablet (5 mg total) by mouth daily. 04/30/15  Yes Vicie Mutters, PA-C  fluticasone (FLONASE) 50 MCG/ACT nasal spray Place 2 sprays into both nostrils at bedtime. PRN    Yes Historical Provider, MD  furosemide (LASIX) 40 MG tablet Take 1 tablet (40 mg total) by mouth daily. 09/24/15  Yes Courtney Forcucci, PA-C   levothyroxine (SYNTHROID, LEVOTHROID) 200 MCG tablet Take 1 tablet (200 mcg total) by mouth daily before breakfast. 04/30/15  Yes Vicie Mutters, PA-C  losartan (COZAAR) 50 MG tablet Take 1 tablet (50 mg total) by mouth daily. 09/24/15  Yes Courtney Forcucci, PA-C  metoprolol succinate (TOPROL-XL) 50 MG 24 hr tablet Take 1 tablet (50 mg total) by mouth daily. Take with or immediately following a meal. 06/21/15  Yes [name], MD  omeprazole (PRILOSEC) 20 MG capsule Take 1 capsule (20 mg total) by mouth 2 (two) times daily. Patient taking differently: Take 20 mg by mouth daily as needed (reflux).  09/24/15  Yes Courtney Forcucci, PA-C  OVER THE COUNTER MEDICATION daily. Vitamin A to Z 50 plus   Yes Historical Provider, MD  polyethylene glycol (MIRALAX / GLYCOLAX) packet Take 17 g by mouth daily.   Yes Historical Provider, MD  spironolactone (ALDACTONE) 25 MG tablet Take 1 tablet (25 mg total) by mouth daily. 09/24/15  Yes Courtney Forcucci, PA-C  warfarin (COUMADIN) 5 MG tablet Takes 7.5 mg daily or as directed. Patient taking differently: Take 7.5-10 mg by mouth daily at 6 PM. Takes 7.5 mg all days except on Friday take 10mg  07/19/15  Yes [name], MD     Positive ROS: per HPI  All other systems have been reviewed and were otherwise negative with the exception of those mentioned in the HPI and as above.  Physical Exam: There were no vitals filed for this visit.  General: Alert, no acute distress Oral: Normal oral mucosa. S/p tonsillectomy. Elongated thick uvula Nasal: septal deviation and enlarged turbinates. No polyps Neck: No palpable adenopathy or thyroid nodules Ear: Ear canal is clear with normal appearing TMs Cardiovascular: Regular rate and rhythm, no murmur.  Respiratory: Clear to auscultation Neurologic: Alert and oriented x 3   Assessment/Plan: septal deviation, turbinate hypertrophy and elongated uvular Plan for Procedure(s): NASAL SEPTOPLASTY WITH BILATERAL TURBINATE  REDUCTION UVULECTOMY   Melony Overly, MD 01/23/2016 1:30 PM

## 2016-01-23 NOTE — Progress Notes (Signed)
Spoke with Joey from [employer]. States to call him day of surgery 20-30 minutes prior to surgery if device needs to be turned off. [phone]

## 2016-01-23 NOTE — Progress Notes (Signed)
PCP: Dr. [name] Cardiologist: Dr. Wendie Chess, MANAGES PT COUMADIN AND ICD, pt states Dr. Georg Ruddle gave him instructions to stop coumadin 4-5 days prior to surgery, but patient was unsure whether he should take a dose on Friday. Pt states he will call Dr. Ignacia Marvel office after coumadin lab visit for more instructions on 01/24/16.  EKG: 01/23/16 Echo: 02/05/14 Stress test: 2015, records requested from Dr. Areatha Keas [facility] Cath: 2015, records requested from [facility]  Pt with ICD, form faxed to Dr. Georg Ruddle, is Dual chamber ICD/PPM per patient and is [employer].   Pt with no complaints of chest pain, SOB, fever today. Pt with hx sleep apnea, unsure who ordered sleep study, but does not wear CPAP

## 2016-01-24 ENCOUNTER — Encounter (HOSPITAL_COMMUNITY): Payer: Self-pay

## 2016-01-24 DIAGNOSIS — I255 Ischemic cardiomyopathy: Secondary | ICD-10-CM | POA: Diagnosis not present

## 2016-01-24 DIAGNOSIS — Z7901 Long term (current) use of anticoagulants: Secondary | ICD-10-CM | POA: Diagnosis not present

## 2016-01-24 DIAGNOSIS — I4891 Unspecified atrial fibrillation: Secondary | ICD-10-CM | POA: Diagnosis not present

## 2016-01-24 DIAGNOSIS — Z95810 Presence of automatic (implantable) cardiac defibrillator: Secondary | ICD-10-CM | POA: Diagnosis not present

## 2016-01-24 NOTE — Progress Notes (Addendum)
Anesthesia Chart Review:  Pt is a 75 year old male scheduled for nasal septoplasty with bilateral turbinate reduction, uvulectomy on 01/30/2016 with Melony Overly, MD.   EP cardiologist is Wendie Chess, MD (notes in care everywhere). Pt does not currently have a primary cardiologist; last saw Dion Saucier, MD with cardiology in [place], last office visit 09/04/14 prior to pt's move to [place].   PMH includes:  CAD (s/p CABG 2015), LBBB, atrial fibrillation (s/p cardioversion 07/28/14), CRT-D [facility] [employer], implanted 07/28/14), prediabetes, hyperlipidemia, hypothyroidism, HTN, COPD, OSA, GERD. Former smoker. BMI 39  Medications include: albuterol, ASA, lipitor, doxazosin, lasix, levothyroxine, losartan, metoprolol, prilosec, spironolactone, coumadin.   EKG 01/23/16: atrial-sensed ventricular paced rhythm.   Holter monitor 06/22/14:  - underlying rhythm is atrial fibrillation with a rate between 64 and 99 bpm. Avg HR was 77 bpm - There were 11,287 ventricular ectopics with 188 couplets but no episodes of ventricular tachycardia.   Nuclear stress test 05/05/14:  - LV dilatation on stress and rest images - perfusion abnormality involving both LAD and RCA distribution - when compared to 2013 study, LV chamber size has increased dramatically. Anterior as well as septal perfusion abnormalities are new.  --- Notes from cardiologist at this time, Dr. Horace Porteous, indicate he was aware of test results. No intervention as far as I can tell from notes.   Echo 05/03/14:  - dilated cardiomyopathy with severe impairment of the LV systolic function. EF 30%. Underlying ischemia cannot be ruled out - Biatrial enlargement - Elevation in PAP 50 mmHg.   Cardiac cath 02/06/14 (pre-CABG at [facility], [place]):  - LAD:  proximal 75% stenosis, mid 70%. 1st diagonal 75% - CX: mid 80% - RCA: large dominant vessel, free of disease  Carotid duplex scan 05/01/10: <50% obstruction of B ICA  Pt is without  active CV symptoms. I have explained to him that he needs a primary cardiologist in addition to seeing EP cardiology.   Reviewed case with Dr. Gifford Shave.   If no changes, I anticipate pt can proceed with surgery as scheduled.   Willeen Cass, FNP-BC [facility]/Anesthesiology Phone: [phone] 01/28/2016 4:18 PM

## 2016-01-29 MED ORDER — DEXTROSE 5 % IV SOLN
3.0000 g | INTRAVENOUS | Status: AC
  Administered 2016-01-30: 3 g via INTRAVENOUS
  Filled 2016-01-29: qty 3000

## 2016-01-30 ENCOUNTER — Encounter (HOSPITAL_COMMUNITY): Payer: Self-pay | Admitting: *Deleted

## 2016-01-30 ENCOUNTER — Ambulatory Visit (HOSPITAL_COMMUNITY): Payer: Medicare Other | Admitting: Anesthesiology

## 2016-01-30 ENCOUNTER — Ambulatory Visit (HOSPITAL_COMMUNITY): Payer: Medicare Other | Admitting: Emergency Medicine

## 2016-01-30 ENCOUNTER — Observation Stay (HOSPITAL_COMMUNITY)
Admission: RE | Admit: 2016-01-30 | Discharge: 2016-01-31 | Disposition: A | Discharge status: Home / Self Care | Payer: Medicare Other | Source: Ambulatory Visit | Attending: Otolaryngology | Admitting: Otolaryngology

## 2016-01-30 ENCOUNTER — Encounter (HOSPITAL_COMMUNITY)
Admission: RE | Disposition: A | Discharge status: Home / Self Care | Payer: Self-pay | Source: Ambulatory Visit | Attending: Otolaryngology

## 2016-01-30 DIAGNOSIS — E785 Hyperlipidemia, unspecified: Secondary | ICD-10-CM | POA: Diagnosis not present

## 2016-01-30 DIAGNOSIS — Z7901 Long term (current) use of anticoagulants: Secondary | ICD-10-CM | POA: Diagnosis not present

## 2016-01-30 DIAGNOSIS — Z79899 Other long term (current) drug therapy: Secondary | ICD-10-CM | POA: Insufficient documentation

## 2016-01-30 DIAGNOSIS — I482 Chronic atrial fibrillation: Secondary | ICD-10-CM | POA: Diagnosis not present

## 2016-01-30 DIAGNOSIS — I10 Essential (primary) hypertension: Secondary | ICD-10-CM | POA: Diagnosis not present

## 2016-01-30 DIAGNOSIS — Z951 Presence of aortocoronary bypass graft: Secondary | ICD-10-CM | POA: Insufficient documentation

## 2016-01-30 DIAGNOSIS — Z95810 Presence of automatic (implantable) cardiac defibrillator: Secondary | ICD-10-CM | POA: Diagnosis not present

## 2016-01-30 DIAGNOSIS — J343 Hypertrophy of nasal turbinates: Secondary | ICD-10-CM | POA: Insufficient documentation

## 2016-01-30 DIAGNOSIS — J449 Chronic obstructive pulmonary disease, unspecified: Secondary | ICD-10-CM | POA: Diagnosis not present

## 2016-01-30 DIAGNOSIS — M1990 Unspecified osteoarthritis, unspecified site: Secondary | ICD-10-CM | POA: Diagnosis not present

## 2016-01-30 DIAGNOSIS — R7303 Prediabetes: Secondary | ICD-10-CM | POA: Diagnosis not present

## 2016-01-30 DIAGNOSIS — J342 Deviated nasal septum: Secondary | ICD-10-CM | POA: Diagnosis not present

## 2016-01-30 DIAGNOSIS — E039 Hypothyroidism, unspecified: Secondary | ICD-10-CM | POA: Diagnosis not present

## 2016-01-30 DIAGNOSIS — Z7982 Long term (current) use of aspirin: Secondary | ICD-10-CM | POA: Insufficient documentation

## 2016-01-30 DIAGNOSIS — Z87891 Personal history of nicotine dependence: Secondary | ICD-10-CM | POA: Diagnosis not present

## 2016-01-30 DIAGNOSIS — G4733 Obstructive sleep apnea (adult) (pediatric): Principal | ICD-10-CM | POA: Insufficient documentation

## 2016-01-30 DIAGNOSIS — K219 Gastro-esophageal reflux disease without esophagitis: Secondary | ICD-10-CM | POA: Insufficient documentation

## 2016-01-30 DIAGNOSIS — I2510 Atherosclerotic heart disease of native coronary artery without angina pectoris: Secondary | ICD-10-CM | POA: Insufficient documentation

## 2016-01-30 DIAGNOSIS — Q386 Other congenital malformations of mouth: Secondary | ICD-10-CM | POA: Diagnosis not present

## 2016-01-30 HISTORY — PX: UVULECTOMY: SHX2631

## 2016-01-30 HISTORY — PX: NASAL SEPTOPLASTY W/ TURBINOPLASTY: SHX2070

## 2016-01-30 LAB — PROTIME-INR
INR: 1.27
PROTHROMBIN TIME: 16 s — AB (ref 11.4–15.2)

## 2016-01-30 SURGERY — SEPTOPLASTY, NOSE, WITH NASAL TURBINATE REDUCTION
Anesthesia: General | Site: Nose

## 2016-01-30 MED ORDER — METOPROLOL SUCCINATE ER 50 MG PO TB24: 50.0000 mg | ORAL_TABLET | Freq: Every day | ORAL | Status: DC

## 2016-01-30 MED ORDER — KCL IN DEXTROSE-NACL 20-5-0.45 MEQ/L-%-% IV SOLN
INTRAVENOUS | Status: DC
  Administered 2016-01-30 – 2016-01-31 (×2): via INTRAVENOUS
  Filled 2016-01-30 (×2): qty 1000

## 2016-01-30 MED ORDER — BACITRACIN ZINC 500 UNIT/GM EX OINT
TOPICAL_OINTMENT | CUTANEOUS | Status: AC
  Filled 2016-01-30: qty 28.35

## 2016-01-30 MED ORDER — HYDROCODONE-ACETAMINOPHEN 7.5-325 MG/15ML PO SOLN
10.0000 mL | ORAL | Status: DC | PRN
  Administered 2016-01-31: 15 mL via ORAL
  Filled 2016-01-30: qty 15

## 2016-01-30 MED ORDER — LOSARTAN POTASSIUM 50 MG PO TABS
50.0000 mg | ORAL_TABLET | Freq: Every day | ORAL | Status: DC
  Administered 2016-01-30 – 2016-01-31 (×2): 50 mg via ORAL
  Filled 2016-01-30 (×2): qty 1

## 2016-01-30 MED ORDER — MORPHINE SULFATE (PF) 4 MG/ML IV SOLN
INTRAVENOUS | Status: AC
  Administered 2016-01-30: 4 mg
  Filled 2016-01-30: qty 1

## 2016-01-30 MED ORDER — LIDOCAINE-EPINEPHRINE 1 %-1:100000 IJ SOLN
INTRAMUSCULAR | Status: DC | PRN
  Administered 2016-01-30: 20 mL

## 2016-01-30 MED ORDER — FENTANYL CITRATE (PF) 100 MCG/2ML IJ SOLN
25.0000 ug | INTRAMUSCULAR | Status: DC | PRN
  Administered 2016-01-30 (×2): 50 ug via INTRAVENOUS

## 2016-01-30 MED ORDER — IBUPROFEN 100 MG/5ML PO SUSP
400.0000 mg | Freq: Four times a day (QID) | ORAL | Status: DC | PRN
  Filled 2016-01-30: qty 20

## 2016-01-30 MED ORDER — FENTANYL CITRATE (PF) 100 MCG/2ML IJ SOLN
INTRAMUSCULAR | Status: DC | PRN
  Administered 2016-01-30 (×3): 50 ug via INTRAVENOUS

## 2016-01-30 MED ORDER — ASPIRIN EC 81 MG PO TBEC
81.0000 mg | DELAYED_RELEASE_TABLET | Freq: Every day | ORAL | Status: DC
  Administered 2016-01-31: 81 mg via ORAL
  Filled 2016-01-30: qty 1

## 2016-01-30 MED ORDER — ONDANSETRON HCL 4 MG/2ML IJ SOLN: 4.0000 mg | INTRAMUSCULAR | Status: DC | PRN

## 2016-01-30 MED ORDER — ROCURONIUM BROMIDE 10 MG/ML (PF) SYRINGE
PREFILLED_SYRINGE | INTRAVENOUS | Status: AC
  Filled 2016-01-30: qty 10

## 2016-01-30 MED ORDER — ALLOPURINOL 300 MG PO TABS
300.0000 mg | ORAL_TABLET | Freq: Every day | ORAL | Status: DC
  Administered 2016-01-31: 300 mg via ORAL
  Filled 2016-01-30: qty 1

## 2016-01-30 MED ORDER — ONDANSETRON HCL 4 MG/2ML IJ SOLN
INTRAMUSCULAR | Status: DC | PRN
  Administered 2016-01-30: 4 mg via INTRAVENOUS

## 2016-01-30 MED ORDER — LACTATED RINGERS IV SOLN
INTRAVENOUS | Status: DC
Start: 2016-01-30 — End: 2016-01-30
  Administered 2016-01-30 (×2): via INTRAVENOUS

## 2016-01-30 MED ORDER — DOCUSATE SODIUM 100 MG PO CAPS
100.0000 mg | ORAL_CAPSULE | Freq: Two times a day (BID) | ORAL | Status: DC
  Administered 2016-01-30 – 2016-01-31 (×2): 100 mg via ORAL
  Filled 2016-01-30 (×2): qty 1

## 2016-01-30 MED ORDER — ROCURONIUM BROMIDE 100 MG/10ML IV SOLN
INTRAVENOUS | Status: DC | PRN
  Administered 2016-01-30: 40 mg via INTRAVENOUS

## 2016-01-30 MED ORDER — 0.9 % SODIUM CHLORIDE (POUR BTL) OPTIME
TOPICAL | Status: DC | PRN
  Administered 2016-01-30: 1000 mL

## 2016-01-30 MED ORDER — FINASTERIDE 5 MG PO TABS
5.0000 mg | ORAL_TABLET | Freq: Every day | ORAL | Status: DC
  Administered 2016-01-31: 5 mg via ORAL
  Filled 2016-01-30: qty 1

## 2016-01-30 MED ORDER — PHENYLEPHRINE 40 MCG/ML (10ML) SYRINGE FOR IV PUSH (FOR BLOOD PRESSURE SUPPORT)
PREFILLED_SYRINGE | INTRAVENOUS | Status: AC
  Filled 2016-01-30: qty 10

## 2016-01-30 MED ORDER — DIPHENHYDRAMINE HCL 25 MG PO CAPS: 25.0000 mg | ORAL_CAPSULE | Freq: Every evening | ORAL | Status: DC | PRN

## 2016-01-30 MED ORDER — PHENYLEPHRINE HCL 10 MG/ML IJ SOLN
INTRAMUSCULAR | Status: DC | PRN
  Administered 2016-01-30: 40 ug via INTRAVENOUS
  Administered 2016-01-30 (×3): 80 ug via INTRAVENOUS
  Administered 2016-01-30: 40 ug via INTRAVENOUS
  Administered 2016-01-30: 80 ug via INTRAVENOUS

## 2016-01-30 MED ORDER — MUPIROCIN 2 % EX OINT
TOPICAL_OINTMENT | CUTANEOUS | Status: AC
  Filled 2016-01-30: qty 22

## 2016-01-30 MED ORDER — LIDOCAINE-EPINEPHRINE 2 %-1:100000 IJ SOLN
INTRAMUSCULAR | Status: AC
  Filled 2016-01-30: qty 1

## 2016-01-30 MED ORDER — FENTANYL CITRATE (PF) 100 MCG/2ML IJ SOLN
INTRAMUSCULAR | Status: AC
  Administered 2016-01-30: 50 ug via INTRAVENOUS
  Filled 2016-01-30: qty 2

## 2016-01-30 MED ORDER — SODIUM CHLORIDE 0.9 % IR SOLN
Status: DC | PRN
  Administered 2016-01-30: 1000 mL

## 2016-01-30 MED ORDER — LIDOCAINE 2% (20 MG/ML) 5 ML SYRINGE
INTRAMUSCULAR | Status: AC
  Filled 2016-01-30: qty 5

## 2016-01-30 MED ORDER — PROPOFOL 10 MG/ML IV BOLUS
INTRAVENOUS | Status: DC | PRN
  Administered 2016-01-30 (×2): 50 mg via INTRAVENOUS
  Administered 2016-01-30: 100 mg via INTRAVENOUS

## 2016-01-30 MED ORDER — ONDANSETRON HCL 4 MG/2ML IJ SOLN: 4.0000 mg | Freq: Once | INTRAMUSCULAR | Status: DC | PRN

## 2016-01-30 MED ORDER — PHENOL 1.4 % MT LIQD
1.0000 | OROMUCOSAL | Status: DC | PRN
  Administered 2016-01-31: 1 via OROMUCOSAL
  Filled 2016-01-30: qty 177

## 2016-01-30 MED ORDER — FUROSEMIDE 40 MG PO TABS
40.0000 mg | ORAL_TABLET | Freq: Every day | ORAL | Status: DC
  Administered 2016-01-31: 40 mg via ORAL
  Filled 2016-01-30: qty 1

## 2016-01-30 MED ORDER — MIDAZOLAM HCL 2 MG/2ML IJ SOLN
INTRAMUSCULAR | Status: AC
  Filled 2016-01-30: qty 2

## 2016-01-30 MED ORDER — LIDOCAINE-EPINEPHRINE 1 %-1:100000 IJ SOLN
INTRAMUSCULAR | Status: AC
  Filled 2016-01-30: qty 1

## 2016-01-30 MED ORDER — SPIRONOLACTONE 25 MG PO TABS
25.0000 mg | ORAL_TABLET | Freq: Every day | ORAL | Status: DC
  Administered 2016-01-31: 25 mg via ORAL
  Filled 2016-01-30: qty 1

## 2016-01-30 MED ORDER — DIPHENHYDRAMINE HCL 25 MG PO TABS
25.0000 mg | ORAL_TABLET | Freq: Every day | ORAL | Status: DC
  Filled 2016-01-30: qty 1

## 2016-01-30 MED ORDER — MORPHINE SULFATE (PF) 2 MG/ML IV SOLN: 2.0000 mg | INTRAVENOUS | Status: DC | PRN

## 2016-01-30 MED ORDER — METOPROLOL SUCCINATE ER 50 MG PO TB24
50.0000 mg | ORAL_TABLET | Freq: Every day | ORAL | Status: DC
  Administered 2016-01-31: 50 mg via ORAL
  Filled 2016-01-30: qty 1

## 2016-01-30 MED ORDER — OXYMETAZOLINE HCL 0.05 % NA SOLN
NASAL | Status: AC
  Filled 2016-01-30: qty 15

## 2016-01-30 MED ORDER — DOXAZOSIN MESYLATE 4 MG PO TABS
4.0000 mg | ORAL_TABLET | Freq: Every day | ORAL | Status: DC
  Administered 2016-01-30 – 2016-01-31 (×2): 4 mg via ORAL
  Filled 2016-01-30 (×2): qty 1

## 2016-01-30 MED ORDER — LIDOCAINE HCL (CARDIAC) 20 MG/ML IV SOLN
INTRAVENOUS | Status: DC | PRN
  Administered 2016-01-30: 100 mg via INTRAVENOUS

## 2016-01-30 MED ORDER — FENTANYL CITRATE (PF) 100 MCG/2ML IJ SOLN
INTRAMUSCULAR | Status: AC
  Filled 2016-01-30: qty 4

## 2016-01-30 MED ORDER — ONDANSETRON HCL 4 MG PO TABS: 4.0000 mg | ORAL_TABLET | ORAL | Status: DC | PRN

## 2016-01-30 MED ORDER — PROPOFOL 10 MG/ML IV BOLUS
INTRAVENOUS | Status: AC
  Filled 2016-01-30: qty 20

## 2016-01-30 MED ORDER — SUGAMMADEX SODIUM 200 MG/2ML IV SOLN
INTRAVENOUS | Status: DC | PRN
  Administered 2016-01-30: 200 mg via INTRAVENOUS

## 2016-01-30 MED ORDER — OXYCODONE HCL 5 MG PO TABS: 5.0000 mg | ORAL_TABLET | Freq: Once | ORAL | Status: DC | PRN

## 2016-01-30 MED ORDER — CEFAZOLIN IN D5W 1 GM/50ML IV SOLN
1.0000 g | Freq: Three times a day (TID) | INTRAVENOUS | Status: DC
  Administered 2016-01-30 – 2016-01-31 (×3): 1 g via INTRAVENOUS
  Filled 2016-01-30 (×5): qty 50

## 2016-01-30 MED ORDER — OXYCODONE HCL 5 MG/5ML PO SOLN: 5.0000 mg | Freq: Once | ORAL | Status: DC | PRN

## 2016-01-30 MED ORDER — MUPIROCIN CALCIUM 2 % EX CREA
TOPICAL_CREAM | CUTANEOUS | Status: DC | PRN
  Administered 2016-01-30: 1 via TOPICAL

## 2016-01-30 MED ORDER — OXYMETAZOLINE HCL 0.05 % NA SOLN
NASAL | Status: DC | PRN
  Administered 2016-01-30: 1

## 2016-01-30 MED ORDER — ALBUTEROL SULFATE (2.5 MG/3ML) 0.083% IN NEBU: 2.5000 mg | INHALATION_SOLUTION | Freq: Four times a day (QID) | RESPIRATORY_TRACT | Status: DC | PRN

## 2016-01-30 MED ORDER — FINASTERIDE 5 MG PO TABS: 5.0000 mg | ORAL_TABLET | Freq: Every day | ORAL | Status: DC

## 2016-01-30 MED ORDER — SUGAMMADEX SODIUM 200 MG/2ML IV SOLN
INTRAVENOUS | Status: AC
  Filled 2016-01-30: qty 2

## 2016-01-30 MED ORDER — PHENYLEPHRINE HCL 10 MG/ML IJ SOLN: INTRAVENOUS | Status: DC | PRN

## 2016-01-30 MED ORDER — PHENYLEPHRINE HCL 10 MG/ML IJ SOLN
INTRAVENOUS | Status: DC | PRN
  Administered 2016-01-30: 50 ug/min via INTRAVENOUS

## 2016-01-30 MED ORDER — INFLUENZA VAC SPLIT QUAD 0.5 ML IM SUSY
0.5000 mL | PREFILLED_SYRINGE | INTRAMUSCULAR | Status: AC
  Administered 2016-01-31: 0.5 mL via INTRAMUSCULAR
  Filled 2016-01-30: qty 0.5

## 2016-01-30 MED ORDER — ACETAMINOPHEN 500 MG PO TABS: 500.0000 mg | ORAL_TABLET | Freq: Four times a day (QID) | ORAL | Status: DC | PRN

## 2016-01-30 MED ORDER — POLYETHYLENE GLYCOL 3350 17 G PO PACK
17.0000 g | PACK | Freq: Every day | ORAL | Status: DC
  Administered 2016-01-31: 17 g via ORAL
  Filled 2016-01-30: qty 1

## 2016-01-30 MED ORDER — ONDANSETRON HCL 4 MG/2ML IJ SOLN
INTRAMUSCULAR | Status: AC
Start: 2016-01-30 — End: 2016-01-30
  Filled 2016-01-30: qty 2

## 2016-01-30 MED ORDER — LEVOTHYROXINE SODIUM 200 MCG PO TABS
200.0000 ug | ORAL_TABLET | Freq: Every day | ORAL | Status: DC
  Administered 2016-01-31: 200 ug via ORAL
  Filled 2016-01-30: qty 1
  Filled 2016-01-30: qty 2

## 2016-01-30 SURGICAL SUPPLY — 53 items
BLADE 10 SAFETY STRL DISP (BLADE) ×3 IMPLANT
BLADE INF TURB ROT M4 2 5PK (BLADE) ×3 IMPLANT
BLADE SURG 15 STRL LF DISP TIS (BLADE) ×2 IMPLANT
BLADE SURG 15 STRL SS (BLADE) ×1
CANISTER SUCTION 2500CC (MISCELLANEOUS) ×3 IMPLANT
CLEANER TIP ELECTROSURG 2X2 (MISCELLANEOUS) ×3 IMPLANT
COAGULATOR SUCT 8FR VV (MISCELLANEOUS) ×3 IMPLANT
DRAPE PROXIMA HALF (DRAPES) IMPLANT
DRESSING NASAL KENNEDY 3.5X.9 (MISCELLANEOUS) IMPLANT
DRESSING TELFA 8X3 (GAUZE/BANDAGES/DRESSINGS) IMPLANT
DRSG NASAL KENNEDY 3.5X.9 (MISCELLANEOUS)
DRSG TELFA 3X8 NADH (GAUZE/BANDAGES/DRESSINGS) ×3 IMPLANT
ELECT COATED BLADE 2.86 ST (ELECTRODE) ×3 IMPLANT
ELECT REM PT RETURN 9FT ADLT (ELECTROSURGICAL) ×3
ELECTRODE REM PT RTRN 9FT ADLT (ELECTROSURGICAL) ×2 IMPLANT
GAUZE SPONGE 2X2 8PLY STRL LF (GAUZE/BANDAGES/DRESSINGS) ×2 IMPLANT
GLOVE BIOGEL PI IND STRL 8 (GLOVE) ×2 IMPLANT
GLOVE BIOGEL PI INDICATOR 8 (GLOVE) ×1
GLOVE SS BIOGEL STRL SZ 7.5 (GLOVE) ×2 IMPLANT
GLOVE SUPERSENSE BIOGEL SZ 7.5 (GLOVE) ×1
GOWN STRL REUS W/ TWL LRG LVL3 (GOWN DISPOSABLE) ×2 IMPLANT
GOWN STRL REUS W/ TWL XL LVL3 (GOWN DISPOSABLE) ×2 IMPLANT
GOWN STRL REUS W/TWL LRG LVL3 (GOWN DISPOSABLE) ×1
GOWN STRL REUS W/TWL XL LVL3 (GOWN DISPOSABLE) ×1
KIT BASIN OR (CUSTOM PROCEDURE TRAY) ×3 IMPLANT
KIT ROOM TURNOVER OR (KITS) ×3 IMPLANT
NEEDLE 27GAX1X1/2 (NEEDLE) ×3 IMPLANT
NEEDLE HYPO 25GX1X1/2 BEV (NEEDLE) IMPLANT
NEEDLE HYPO 25X1 1.5 SAFETY (NEEDLE) ×3 IMPLANT
NS IRRIG 1000ML POUR BTL (IV SOLUTION) ×3 IMPLANT
PAD ARMBOARD 7.5X6 YLW CONV (MISCELLANEOUS) ×6 IMPLANT
PATTIES SURGICAL .5 X3 (DISPOSABLE) ×3 IMPLANT
PENCIL FOOT CONTROL (ELECTRODE) ×3 IMPLANT
SPECIMEN JAR SMALL (MISCELLANEOUS) ×3 IMPLANT
SPLINT NASAL DOYLE BI-VL (GAUZE/BANDAGES/DRESSINGS) IMPLANT
SPONGE GAUZE 2X2 STER 10/PKG (GAUZE/BANDAGES/DRESSINGS) ×1
SPONGE INTESTINAL PEANUT (DISPOSABLE) ×3 IMPLANT
SPONGE TONSIL 1 RF SGL (DISPOSABLE) IMPLANT
STRIP CLOSURE SKIN 1/2X4 (GAUZE/BANDAGES/DRESSINGS) IMPLANT
SUT CHROMIC 3 0 PS 2 (SUTURE) IMPLANT
SUT CHROMIC 4 0 PS 2 18 (SUTURE) ×6 IMPLANT
SUT CHROMIC 5 0 P 3 (SUTURE) IMPLANT
SUT ETHILON 3 0 PS 1 (SUTURE) IMPLANT
SUT ETHILON 4 0 PS 2 18 (SUTURE) IMPLANT
SUT ETHILON 5 0 P 3 18 (SUTURE)
SUT NYLON ETHILON 5-0 P-3 1X18 (SUTURE) IMPLANT
SUT SILK 2 0 FS (SUTURE) ×3 IMPLANT
SUT VIC AB 3-0 SH 27 (SUTURE) ×1
SUT VIC AB 3-0 SH 27XBRD (SUTURE) ×2 IMPLANT
SYR 3ML 25GX5/8 SAFETY (SYRINGE) IMPLANT
TOWEL OR 17X24 6PK STRL BLUE (TOWEL DISPOSABLE) ×3 IMPLANT
TRAY ENT MC OR (CUSTOM PROCEDURE TRAY) ×3 IMPLANT
WATER STERILE IRR 1000ML POUR (IV SOLUTION) IMPLANT

## 2016-01-30 NOTE — Anesthesia Preprocedure Evaluation (Addendum)
Anesthesia Evaluation  Patient identified by MRN, date of birth, ID band Patient awake    Reviewed: Allergy & Precautions, H&P , NPO status , Patient's Chart, lab work & pertinent test results  History of Anesthesia Complications Negative for: history of anesthetic complications  Airway Mallampati: II  TM Distance: >3 FB Neck ROM: full    Dental no notable dental hx. (+) Dental Advisory Given   Pulmonary sleep apnea and Continuous Positive Airway Pressure Ventilation , COPD, former smoker,    Pulmonary exam normal breath sounds clear to auscultation       Cardiovascular hypertension, + CAD and + CABG  Normal cardiovascular exam+ dysrhythmias Atrial Fibrillation + pacemaker + Cardiac Defibrillator  Rhythm:regular Rate:Normal     Neuro/Psych negative neurological ROS     GI/Hepatic Neg liver ROS, GERD  ,  Endo/Other  Hypothyroidism   Renal/GU negative Renal ROS     Musculoskeletal  (+) Arthritis ,   Abdominal   Peds  Hematology negative hematology ROS (+)   Anesthesia Other Findings   Reproductive/Obstetrics negative OB ROS                            Anesthesia Physical Anesthesia Plan  ASA: III  Anesthesia Plan: General   Post-op Pain Management:    Induction: Intravenous  Airway Management Planned: Oral ETT  Additional Equipment:   Intra-op Plan:   Post-operative Plan: Extubation in OR  Informed Consent: I have reviewed the patients History and Physical, chart, labs and discussed the procedure including the risks, benefits and alternatives for the proposed anesthesia with the patient or authorized representative who has indicated his/her understanding and acceptance.   Dental Advisory Given  Plan Discussed with: Anesthesiologist, CRNA and Surgeon  Anesthesia Plan Comments:         Anesthesia Quick Evaluation

## 2016-01-30 NOTE — Anesthesia Postprocedure Evaluation (Signed)
Anesthesia Post Note  Patient: [name] [name]  Procedure(s) Performed: Procedure(s) (LRB): NASAL SEPTOPLASTY WITH BILATERAL TURBINATE REDUCTION (Bilateral) UVULECTOMY (N/A)  Patient location during evaluation: PACU Anesthesia Type: General Level of consciousness: awake and alert Pain management: pain level controlled Vital Signs Assessment: post-procedure vital signs reviewed and stable Respiratory status: spontaneous breathing, nonlabored ventilation, respiratory function stable and patient connected to nasal cannula oxygen Cardiovascular status: blood pressure returned to baseline and stable Postop Assessment: no signs of nausea or vomiting Anesthetic complications: no Comments: Magnet removed from ICD per manufacture's recommendations    Last Vitals:  Vitals:   01/30/16 1045 01/30/16 1454  BP: 132/74   Pulse: 64   Resp: 18   Temp: 36.6 C 36.6 C    Last Pain:  Vitals:   01/30/16 1045  TempSrc: Oral                 Zenaida Deed

## 2016-01-30 NOTE — Progress Notes (Signed)
Spoke with Dr. Jillyn Hidden regarding ICD. States OKAY to use magnet during surgery,if Rep needed phone number on front of chart.

## 2016-01-30 NOTE — Transfer of Care (Addendum)
Immediate Anesthesia Transfer of Care Note  Patient: [name] [name]  Procedure(s) Performed: Procedure(s): NASAL SEPTOPLASTY WITH BILATERAL TURBINATE REDUCTION (Bilateral) UVULECTOMY (N/A)  Patient Location: PACU  Anesthesia Type:General  Level of Consciousness: awake, oriented and patient cooperative  Airway & Oxygen Therapy: Patient Spontanous Breathing and Patient connected to face mask oxygen  Post-op Assessment: Report given to RN and Post -op Vital signs reviewed and stable  Post vital signs: Reviewed  Last Vitals:  Vitals:   01/30/16 1045 01/30/16 1454  BP: 132/74   Pulse: 64   Resp: 18   Temp: 36.6 C 36.6 C    Last Pain:  Vitals:   01/30/16 1045  TempSrc: Oral         Complications: No apparent anesthesia complications

## 2016-01-30 NOTE — Brief Op Note (Signed)
01/30/2016  2:41 PM  PATIENT:  [name] [name]  75 y.o. male  PRE-OPERATIVE DIAGNOSIS:  septal deviation, turbinate hypertrophy and elongated uvula  POST-OPERATIVE DIAGNOSIS:  septal deviation, turbinate hypertrophy and elongated uvula  PROCEDURE:  Procedure(s): NASAL SEPTOPLASTY WITH BILATERAL TURBINATE REDUCTION (Bilateral) UVULECTOMY (N/A)  SURGEON:  Surgeon(s) and Role:    * Rozetta Nunnery, MD - Primary  PHYSICIAN ASSISTANT:   ASSISTANTS: none   ANESTHESIA:   general  EBL:  No intake/output data recorded.  BLOOD ADMINISTERED:none  DRAINS: none   LOCAL MEDICATIONS USED:  XYLOCAINE with Epi 12 cc  SPECIMEN:  No Specimen  DISPOSITION OF SPECIMEN:  N/A  COUNTS:  YES  TOURNIQUET:  * No tourniquets in log *  DICTATION: .Other Dictation: Dictation Number [id number]  PLAN OF CARE: Admit for overnight observation  PATIENT DISPOSITION:  PACU - hemodynamically stable.   Delay start of Pharmacological VTE agent (>24hrs) due to surgical blood loss or risk of bleeding: yes

## 2016-01-30 NOTE — Anesthesia Procedure Notes (Signed)
Procedure Name: Intubation Date/Time: 01/30/2016 1:08 PM Performed by: Jenne Campus Pre-anesthesia Checklist: Patient identified, Emergency Drugs available, Suction available and Patient being monitored Patient Re-evaluated:Patient Re-evaluated prior to inductionOxygen Delivery Method: Circle System Utilized Preoxygenation: Pre-oxygenation with 100% oxygen Intubation Type: IV induction Ventilation: Mask ventilation without difficulty Laryngoscope Size: Mac and 3 Grade View: Grade I Tube type: Oral Tube size: 7.5 mm Number of attempts: 1 Airway Equipment and Method: Stylet and Oral airway Placement Confirmation: ETT inserted through vocal cords under direct vision,  positive ETCO2 and breath sounds checked- equal and bilateral Secured at: 21 cm Tube secured with: Tape Dental Injury: Teeth and Oropharynx as per pre-operative assessment

## 2016-01-30 NOTE — Interval H&P Note (Signed)
History and Physical Interval Note:  01/30/2016 12:57 PM  [name] [name]  has presented today for surgery, with the diagnosis of septal deviation, turbinate hypertrophy and elongated uvular  The various methods of treatment have been discussed with the patient and family. After consideration of risks, benefits and other options for treatment, the patient has consented to  Procedure(s): NASAL SEPTOPLASTY WITH BILATERAL TURBINATE REDUCTION (Bilateral) UVULECTOMY (N/A) as a surgical intervention .  The patient's history has been reviewed, patient examined, no change in status, stable for surgery.  I have reviewed the patient's chart and labs.  Questions were answered to the patient's satisfaction.     [name]

## 2016-01-31 ENCOUNTER — Encounter (HOSPITAL_COMMUNITY): Payer: Self-pay | Admitting: Otolaryngology

## 2016-01-31 DIAGNOSIS — Z7901 Long term (current) use of anticoagulants: Secondary | ICD-10-CM | POA: Diagnosis not present

## 2016-01-31 DIAGNOSIS — J342 Deviated nasal septum: Secondary | ICD-10-CM | POA: Diagnosis not present

## 2016-01-31 DIAGNOSIS — J343 Hypertrophy of nasal turbinates: Secondary | ICD-10-CM | POA: Diagnosis not present

## 2016-01-31 DIAGNOSIS — Z95810 Presence of automatic (implantable) cardiac defibrillator: Secondary | ICD-10-CM | POA: Diagnosis not present

## 2016-01-31 DIAGNOSIS — G4733 Obstructive sleep apnea (adult) (pediatric): Secondary | ICD-10-CM | POA: Diagnosis not present

## 2016-01-31 DIAGNOSIS — J449 Chronic obstructive pulmonary disease, unspecified: Secondary | ICD-10-CM | POA: Diagnosis not present

## 2016-01-31 MED ORDER — CEPHALEXIN 500 MG PO CAPS: 500.0000 mg | ORAL_CAPSULE | Freq: Two times a day (BID) | ORAL | 0 refills | Status: DC

## 2016-01-31 MED ORDER — HYDROCODONE-ACETAMINOPHEN 5-325 MG PO TABS: 1.0000 | ORAL_TABLET | Freq: Four times a day (QID) | ORAL | 0 refills | Status: DC | PRN

## 2016-01-31 MED ORDER — WARFARIN SODIUM 5 MG PO TABS: ORAL_TABLET | ORAL | 1 refills | Status: DC

## 2016-01-31 NOTE — Discharge Summary (Signed)
Discharge dictated [phone]

## 2016-01-31 NOTE — Progress Notes (Signed)
POD 1 AF VSS O2 > 90 Tolerating pos OK Nasal packing removed at bedside with mild bleeding Stable post op course Discharge home on Keflex 500 mg bid for 1 week, Hydrocodone 5mg  tabs prn pain and restart coumadin tomorrow

## 2016-01-31 NOTE — Care Management Obs Status (Signed)
[facility] NOTIFICATION   Patient Details  Name: [name] [name] MRN: [id number] Date of Birth: 21-Aug-1940   Medicare Observation Status Notification Given:  Yes    Zenon Mayo, RN 01/31/2016, 1:57 PM

## 2016-01-31 NOTE — Care Management Note (Signed)
Case Management Note  Patient Details  Name: [name] [name] MRN: [id number] Date of Birth: 1941-01-29  Subjective/Objective:   Patient is from home with spouse, pta indep, s/p septoplasty with bilateral inferior turbinate reductions and uvulectomy. He is for discharge today, he has a pcp, he has medication coverage and he has transport at dc.  No other needs.                  Action/Plan:   Expected Discharge Date:                  Expected Discharge Plan:  Home/Self Care  In-House Referral:     Discharge planning Services  CM Consult  Post Acute Care Choice:    Choice offered to:     DME Arranged:    DME Agency:     HH Arranged:    HH Agency:     Status of Service:  Completed, signed off  If discussed at [employer] of Stay Meetings, dates discussed:    Additional Comments:  Zenon Mayo, RN 01/31/2016, 1:55 PM

## 2016-01-31 NOTE — Op Note (Signed)
[name], [name] [name] NO.:  [id number]  MEDICAL RECORD NO.:  [id number]  LOCATION:  [id number]                        FACILITY:  [facility]  PHYSICIAN:  Leonides Sake. Lucia Gaskins, M.D.DATE OF BIRTH:  April 30, 1941  DATE OF PROCEDURE:  01/30/2016 DATE OF DISCHARGE:                              OPERATIVE REPORT   PREOPERATIVE DIAGNOSIS:  Obstructive sleep apnea with chronic nasal obstruction secondary to septal deviation and turbinate hypertrophy and elongated uvula.  POSTOPERATIVE DIAGNOSIS:  Obstructive sleep apnea with chronic nasal obstruction secondary to septal deviation and turbinate hypertrophy and elongated uvula.  OPERATION PERFORMED:  Septoplasty with bilateral inferior turbinate reductions and uvulectomy.  SURGEON:  Leonides Sake. Lucia Gaskins, M.D.  ANESTHESIA:  General endotracheal.  COMPLICATIONS:  None.  BRIEF CLINICAL NOTE:  [name] [name] is a 75 year old gentleman who has had chronic problems with nasal obstruction.  He has a long history of obstructive sleep apnea as well as cardiac disease with chronic AFib and on Coumadin.  He has tried nasal CPAP because of the sleep apnea but has not really tolerated nasal CPAP well because of chronic nasal obstruction.  On oral exam, he is status post tonsillectomy and has a very elongated uvula.  He is taken to the operating room at this time for septoplasty and turbinate reductions to help improve his nasal breathing and uvulectomy to help improve the sleep apnea.  Discussed with him that he will still need to use the nasal CPAP machine after the procedure.  DESCRIPTION OF PROCEDURE:  After adequate endotracheal anesthesia, the nose was prepped with Betadine solution and draped out with sterile towels.  The nose was then further prepped with cotton pledgets soaked in Afrin, and the septum and turbinates were injected with Xylocaine with epinephrine for local hemostasis.  On exam, patient had anterior deviation of  the septum, more to the right.  He had bony superior aspect of the septum deviated to the right, and then along of the maxillary crest, he had a sharp spur into the left airway protruding to the left inferior turbinate.  Mucoperiosteal flaps were elevated along the hemitransfixion incision made on the right side of the septum.  The upper bony vomer portion of the septum that protruded to the right had mucoperiosteal elevations on either side, and the bony deviation was removed.  Along the maxillary crest along the floor of the nose where the spur protruding to the left inferior turbinate, this was removed after elevating mucoperiosteal flaps on either side of the spur.  This allowed the septum to return much better to the midline.  Next using the [employer] turbinate blade, bilateral inferior turbinate reductions were performed.  Turbinate bone was outfractured and hemostasis was obtained with suction cautery.  This completed the turbinate reductions.  The hemitransfixion incision was closed with interrupted 4-0 chromic suture. Septum was based with 4-0 chromic suture.  Telfa soaked in Bactroban 2% ointment was placed along the floor of the nose on both sides.  This completed the septoplasty and turbinate reductions.  Next, the patient was turned and mouth gag was used to expose the oropharynx.  The patient had very elongated uvula.  The uvula was transected at its base of attachment  to the soft palate.  In addition, about 6 mm vertical incisions were made in the soft palate on either side of the uvula. This completed the uvulectomy.  The patient was awoken from anesthesia and transferred to recovery room, postop doing well.  DISPOSITION:  The patient is to be observed overnight in the step-down unit and plan on discharge in the morning after removing his nasal packs.  DISCHARGE MEDICATIONS:  Will include amoxicillin suspension 5 mg b.i.d. for a week and hydrocodone elixir 2 to 3  teaspoons q.6 h. p.r.n. pain. We will have him follow up in my office in 10 days for recheck.          ______________________________ Leonides Sake. Lucia Gaskins, M.D.     CEN/MEDQ  D:  01/30/2016  T:  01/31/2016  Job:  [id number]  cc:   [name], M.D.

## 2016-01-31 NOTE — Discharge Instructions (Signed)
Restart coumadin tomorrow 7.5 mg Start Keflex 500 mg twice per day tonight Tylenol , motrin or hydrocodone prn pain Rinse your nose with saline nasal spray a couple of times per day Call Dr Pollie Friar office for follow up appt in 7-8 days     [phone]

## 2016-01-31 NOTE — Progress Notes (Signed)
Received order to d/c patient.  IVs removed.  Provided pt with education for d/c and answered all questions.

## 2016-02-01 NOTE — Op Note (Deleted)
NAMEJAYCIEON, [name] NO.:  [id number]  MEDICAL RECORD NO.:  [id number]  LOCATION:                               FACILITY:  [facility]  PHYSICIAN:  Leonides Sake. Lucia Gaskins, M.D.DATE OF BIRTH:  Jan 25, 1941  DATE OF PROCEDURE: DATE OF DISCHARGE:  01/31/2016                              OPERATIVE REPORT   PREOPERATIVE DIAGNOSES: 1. Obstructive sleep apnea. 2. Chronic nasal obstruction with a deviated septum and turbinate     hypertrophy. 3. History of chronic atrial fibrillation with pacemaker and on     Coumadin. 4. Hypertension. 5. Hypothyroidism. 6. History of coronary artery disease.  PROCEDURE DURING THIS HOSPITALIZATION:  Septoplasty with bilateral inferior turbinate reductions and uvulectomy on December 30, 2015.  HOSPITAL COURSE:  The patient was admitted via the operating room on December 30, 2015, at which time, he underwent a septoplasty and turbinate reductions, because of chronic nasal obstruction, not able to tolerate nasal CPAP for his obstructive sleep apnea.  At the same time of the septoplasty and turbinate reductions, he had an uvulectomy performed as he had a very little elongated uvula.  The patient underwent septoplasty, turbinate reductions and uvulectomy on January 30, 2016, without any complications.  Postoperatively, he was admitted to the Step- Down Unit for 24-hour observation.  He was maintained on Ancef 1 g IV q.8 hours during this hospitalization.  He had no significant airway problems with sats above 90 and no significant bleeding.  His nasal packing was removed on January 31, 2016, and the patient was subsequently discharged home.  The patient was discharged home on his regular medications.  He was instructed to restart his Coumadin, which had been stopped for 5 days, on the day after discharge on Friday, 7.5 mg daily.  He was additionally given Keflex 500 mg b.i.d. for 1 week and instructed to take Tylenol, Motrin or hydrocodone 5 mg  tablets 1-2 q.6 hours p.r.n. pain.  I will have him follow up in my office in 1 week for recheck.    ______________________________ Leonides Sake. Lucia Gaskins, M.D.   ______________________________ Leonides Sake. Lucia Gaskins, M.D.    CEN/MEDQ  D:  01/31/2016  T:  02/01/2016  Job:  [id number]

## 2016-02-04 DIAGNOSIS — R2232 Localized swelling, mass and lump, left upper limb: Secondary | ICD-10-CM | POA: Diagnosis not present

## 2016-02-04 DIAGNOSIS — Z7951 Long term (current) use of inhaled steroids: Secondary | ICD-10-CM | POA: Diagnosis not present

## 2016-02-04 DIAGNOSIS — Z7982 Long term (current) use of aspirin: Secondary | ICD-10-CM | POA: Diagnosis not present

## 2016-02-04 DIAGNOSIS — M79642 Pain in left hand: Secondary | ICD-10-CM | POA: Diagnosis not present

## 2016-02-04 DIAGNOSIS — I808 Phlebitis and thrombophlebitis of other sites: Secondary | ICD-10-CM | POA: Diagnosis not present

## 2016-02-04 DIAGNOSIS — Z87891 Personal history of nicotine dependence: Secondary | ICD-10-CM | POA: Diagnosis not present

## 2016-02-04 DIAGNOSIS — Z79899 Other long term (current) drug therapy: Secondary | ICD-10-CM | POA: Diagnosis not present

## 2016-02-04 DIAGNOSIS — Z951 Presence of aortocoronary bypass graft: Secondary | ICD-10-CM | POA: Diagnosis not present

## 2016-02-04 DIAGNOSIS — Z7901 Long term (current) use of anticoagulants: Secondary | ICD-10-CM | POA: Diagnosis not present

## 2016-02-04 DIAGNOSIS — I82612 Acute embolism and thrombosis of superficial veins of left upper extremity: Secondary | ICD-10-CM | POA: Diagnosis not present

## 2016-02-07 ENCOUNTER — Encounter: Payer: Self-pay | Admitting: Physician Assistant

## 2016-02-07 ENCOUNTER — Ambulatory Visit (INDEPENDENT_AMBULATORY_CARE_PROVIDER_SITE_OTHER): Payer: Medicare Other | Admitting: Physician Assistant

## 2016-02-07 VITALS — BP 128/60 | HR 73 | Temp 98.1°F | Resp 16 | Ht 69.0 in | Wt 255.6 lb

## 2016-02-07 DIAGNOSIS — I10 Essential (primary) hypertension: Secondary | ICD-10-CM

## 2016-02-07 DIAGNOSIS — E039 Hypothyroidism, unspecified: Secondary | ICD-10-CM

## 2016-02-07 DIAGNOSIS — M109 Gout, unspecified: Secondary | ICD-10-CM

## 2016-02-07 DIAGNOSIS — B3781 Candidal esophagitis: Secondary | ICD-10-CM

## 2016-02-07 DIAGNOSIS — I4891 Unspecified atrial fibrillation: Secondary | ICD-10-CM | POA: Diagnosis not present

## 2016-02-07 DIAGNOSIS — I809 Phlebitis and thrombophlebitis of unspecified site: Secondary | ICD-10-CM | POA: Diagnosis not present

## 2016-02-07 MED ORDER — FLUCONAZOLE 200 MG PO TABS: ORAL_TABLET | ORAL | 0 refills | Status: DC

## 2016-02-07 NOTE — Patient Instructions (Signed)
Take the diflucan for your throat Elevated your hand/left arm  I will let you know how to take the coumadin tomorrow.   Phlebitis Phlebitis is soreness and swelling (inflammation) of a vein. This can occur in your arms, legs, or torso (trunk), as well as deeper inside your body. Phlebitis is usually not serious when it occurs close to the surface of the body. However, it can cause serious problems when it occurs in a vein deeper inside the body. CAUSES  Phlebitis can be triggered by various things, including:   Reduced blood flow through your veins. This can happen with:  Bed rest over a long period.  Long-distance travel.  Injury.  Surgery.  Being overweight (obese) or pregnant.  Having an IV tube put in the vein and getting certain medicines through the vein.  Cancer and cancer treatment.  Use of illegal drugs taken through the vein.  Inflammatory diseases.  Inherited (genetic) diseases that increase the risk of blood clots.  Hormone therapy, such as birth control pills. SIGNS AND SYMPTOMS   Red, tender, swollen, and painful area on your skin. Usually, the area will be long and narrow.  Firmness along the center of the affected area. This can indicate that a blood clot has formed.  Low-grade fever. DIAGNOSIS  A health care provider can usually diagnose phlebitis by examining the affected area and asking about your symptoms. To check for infection or blood clots, your health care provider may order blood tests or an ultrasound exam of the area. Blood tests and your family history may also indicate if you have an underlying genetic disease that causes blood clots. Occasionally, a piece of tissue is taken from the body (biopsy sample) if an unusual cause of phlebitis is suspected. TREATMENT  Treatment will vary depending on the severity of the condition and the area of the body affected. Treatment may include:  Use of a warm compress or heating pad.  Use of compression  stockings or bandages.  Anti-inflammatory medicines.  Removal of any IV tube that may be causing the problem.  Medicines that kill germs (antibiotics) if an infection is present.  Blood-thinning medicines if a blood clot is suspected or present.  In rare cases, surgery may be needed to remove damaged sections of vein. HOME CARE INSTRUCTIONS   Only take over-the-counter or prescription medicines as directed by your health care provider. Take all medicines exactly as prescribed.  Raise (elevate) the affected area above the level of your heart as directed by your health care provider.  Apply a warm compress or heating pad to the affected area as directed by your health care provider. Do not sleep with the heating pad.  Use compression stockings or bandages as directed. These will speed healing and prevent the condition from coming back.  If you are on blood thinners:  Get follow-up blood tests as directed by your health care provider.  Check with your health care provider before using any new medicines.  Carry a medical alert card or wear your medical alert jewelry to show that you are on blood thinners.  For phlebitis in the legs:  Avoid prolonged standing or bed rest.  Keep your legs moving. Raise your legs when sitting or lying.  Do not smoke.  Women, particularly those over the age of 24, should consider the risks and benefits of taking the contraceptive pill. This kind of hormone treatment can increase your risk for blood clots.  Follow up with your health care provider as  directed. SEEK MEDICAL CARE IF:   You have unusual bruising or any bleeding problems.  Your swelling or pain in the affected area is not improving.  You are on anti-inflammatory medicine, and you develop belly (abdominal) pain. SEEK IMMEDIATE MEDICAL CARE IF:   You have a sudden onset of chest pain or difficulty breathing.  You have a fever or persistent symptoms for more than 2-3 days.  You  have a fever and your symptoms suddenly get worse. MAKE SURE YOU:  Understand these instructions.  Will watch your condition.  Will get help right away if you are not doing well or get worse.   This information is not intended to replace advice given to you by your health care provider. Make sure you discuss any questions you have with your health care provider.   Document Released: 04/29/2001 Document Revised: 02/23/2013 Document Reviewed: 01/10/2013 Elsevier Interactive Patient Education [employer].

## 2016-02-07 NOTE — Progress Notes (Signed)
Assessment and Plan: Hospital visit follow up for Nasal septoplasty- he is healing well from the surgery  Superficial thrombophlebitis of left arm- negative DVT study- now on clindamycin- swelling decreasing- finish ABX, add probiotic, elevate arm/hand  Esophageal candidiasis- scrapable white plaques tongue and posterior pharanyx- thrust versus esophageal with symptoms, will treat with diflucan x 14 days  Afib- on coumadin- with several ABX and diflucan, will check INR today- has appt at coumadin clinic next week- may need to decrease coumadin dosage and monitor very closely.   Over 40 minutes of exam, counseling, chart review, and complex, moderate level critical decision making was performed this visit.   Future Appointments Date Time Provider [place]  03/07/2016 11:15 AM Vicie Mutters, PA-C [facility] None  11/[name]/2017 4:00 PM Starlyn Skeans, PA-C [facility] None  02/18/2017 3:00 PM [name], MD [facility] None     HPI [name] [name] presents for follow up from the hospital. Admit date to the hospital was 01/30/16, patient was discharged from the hospital on 01/31/16 and our office contacted the office the day after discharge to set up a follow up appointment, patient was admitted for: Nasal septoplasty with Dr. Lucia Gaskins for sleep apnea. He has a history of Afib, his coumadin was stopped for 5 days prior to the procedure, and he was restarted the next day. He follows with [facility] for his coumadin.   He has swelling left arm, he states he had an IV that arm, after the surgery he started to feel warmth in his left arm. He was sent home on Bactrim and hydrocodone, swelling got worse over the next couple of days. He went to [facility] on 02/04/2016, put on clindamycin and was on oxycodone but wife states that he had some confusion and she put him back on hydrocodone. BP recheck 128/60.  He continue to have a severe sore throat, pain with swallowing liquid/solids, only able to eat soft  food and is use chloraseptic spray, feels like there is something in his throat.   Had [place] negative for DVT, superficial thrombophlebitis involving cephalic vein of wrist. Last INR 1.3 in ER. Has been on 7.5mg  daily He has some warmth and pain at left thumb.   Lab Results  Component Value Date   INR 3.1 (H) 02/07/2016   INR 1.27 01/30/2016   INR 1.96 (H) 06/21/2015   BMI is Body mass index is 37.[name] kg/m., he is working on diet and exercise. Wt Readings from Last 3 Encounters:  02/07/16 255 lb 9.6 oz (115.9 kg)  01/30/16 262 lb (118.8 kg)  01/23/16 262 lb 14.4 oz (119.3 kg)    Blood pressure 128/60, pulse 73, temperature 98.1 F (36.7 C), resp. rate 16, height 5\' 9"  (1.753 m), weight 255 lb 9.6 oz (115.9 kg), SpO2 98 %.   Past Medical History:  Diagnosis Date  . A-fib ([place]) 01/2014  . AICD (automatic cardioverter/defibrillator) present    [employer]  . Arthritis   . ASHD (arteriosclerotic heart disease) 2015   s/p CABG  . Cataract    s/p left  . COPD (chronic obstructive pulmonary disease) ([place])    by CXR, pt unsure of this  . GERD (gastroesophageal reflux disease)   . Hyperlipidemia   . Hypertension   . Hypothyroidism   . LBBB (left bundle branch block)   . Pre-diabetes   . Prediabetes 01/2014  . Presence of permanent cardiac pacemaker   . Sleep apnea    no CPAP  . Thyroid disease  hypothyroid     Allergies  Allergen Reactions  . Other Swelling    SODIUM SENSITIVE      Current Outpatient Prescriptions on File Prior to Visit  Medication Sig Dispense Refill  . acetaminophen (TYLENOL) 500 MG tablet Take 500 mg by mouth every 6 (six) hours as needed for mild pain.     Marland Kitchen albuterol (PROVENTIL HFA;VENTOLIN HFA) 108 (90 BASE) MCG/ACT inhaler Inhale 2 puffs into the lungs every 6 (six) hours as needed for wheezing or shortness of breath.    . allopurinol (ZYLOPRIM) 300 MG tablet Take 1 tablet (300 mg total) by mouth daily. 90 tablet 3  . aspirin EC 81 MG  tablet Take 81 mg by mouth daily.    Marland Kitchen atorvastatin (LIPITOR) 80 MG tablet Take 1/2 to 1 tablet daily or as directed for Cholesterol (Patient taking differently: Take 40 mg by mouth daily at 6 PM. ) 90 tablet 11  . Black Pepper-Turmeric (TURMERIC COMPLEX/BLACK PEPPER PO) Take 1 capsule by mouth daily.    . Cholecalciferol (VITAMIN D PO) Take 15,000 Units by mouth daily.     . diphenhydrAMINE (BENADRYL) 25 MG tablet Take 25 mg by mouth at bedtime.    . docusate sodium (COLACE) 100 MG capsule Take 100 mg by mouth 2 (two) times daily.    Marland Kitchen doxazosin (CARDURA) 4 MG tablet Take 1 tablet (4 mg total) by mouth daily. 90 tablet 4  . finasteride (PROSCAR) 5 MG tablet Take 1 tablet (5 mg total) by mouth daily. 90 tablet 3  . fluticasone (FLONASE) 50 MCG/ACT nasal spray Place 2 sprays into both nostrils at bedtime. PRN     . furosemide (LASIX) 40 MG tablet Take 1 tablet (40 mg total) by mouth daily. 90 tablet 1  . HYDROcodone-acetaminophen (NORCO/VICODIN) 5-325 MG tablet Take 1-2 tablets by mouth every 6 (six) hours as needed for moderate pain. 24 tablet 0  . levothyroxine (SYNTHROID, LEVOTHROID) 200 MCG tablet Take 1 tablet (200 mcg total) by mouth daily before breakfast. 90 tablet 3  . losartan (COZAAR) 50 MG tablet Take 1 tablet (50 mg total) by mouth daily. 90 tablet 1  . metoprolol succinate (TOPROL-XL) 50 MG 24 hr tablet Take 1 tablet (50 mg total) by mouth daily. Take with or immediately following a meal. 90 tablet 4  . omeprazole (PRILOSEC) 20 MG capsule Take 1 capsule (20 mg total) by mouth 2 (two) times daily. (Patient taking differently: Take 20 mg by mouth daily as needed (reflux). ) 180 capsule 1  . OVER THE COUNTER MEDICATION daily. Vitamin A to Z 50 plus    . polyethylene glycol (MIRALAX / GLYCOLAX) packet Take 17 g by mouth daily.    Marland Kitchen spironolactone (ALDACTONE) 25 MG tablet Take 1 tablet (25 mg total) by mouth daily. 90 tablet 1  . warfarin (COUMADIN) 5 MG tablet Takes 7.5 mg daily or as  directed. 145 tablet 1   No current facility-administered medications on file prior to visit.     ROS: all negative except above.   Physical Exam: Filed Weights   02/07/16 1415  Weight: 255 lb 9.6 oz (115.9 kg)   BP 128/60   Pulse 73   Temp 98.1 F (36.7 C)   Resp 16   Ht 5\' 9"  (1.753 m)   Wt 255 lb 9.6 oz (115.9 kg)   SpO2 98%   BMI 37.[name] kg/m  General Appearance: Well nourished, in no apparent distress. Eyes: PERRLA, EOMs, conjunctiva no swelling or erythema Sinuses:  No Frontal/maxillary tenderness ENT/Mouth: Ext aud canals clear, TMs without erythema, bulging.  Post pharynx with scrapable white plaques extending into his esophagus..  Tonsils not swollen or erythematous. Hearing decreased.  Neck: Supple, thyroid normal.  Respiratory: Respiratory effort normal, BS equal bilaterally without rales, rhonchi, wheezing or stridor.  Cardio: RRR with no MRGs. Brisk peripheral pulses with edema 1+.  Abdomen: Soft, + BS,obese  Non tender, no guarding, rebound, hernias, masses. Lymphatics: Non tender without lymphadenopathy.  Musculoskeletal: Full ROM, 5/5 strength, Normal gait. Left hand with swelling/2 + edema, mild warmth at left thumb MCP, good distal neurovascular, no erythema.  Skin: Well healed vertical vein harvest site with stasis dermatitis. Warm, dry without rashes, lesions, ecchymosis.  Neuro: Cranial nerves intact. Normal muscle tone, no cerebellar symptoms. Psych: Awake and oriented X 3, normal affect, Insight and Judgment appropriate.     Vicie Mutters, PA-C 7:43 AM [facility]

## 2016-02-08 LAB — CBC WITH DIFFERENTIAL/PLATELET
BASOS PCT: 1 %
Basophils Absolute: 111 cells/uL (ref 0–200)
EOS ABS: 111 {cells}/uL (ref 15–500)
Eosinophils Relative: 1 %
HEMATOCRIT: 35.8 % — AB (ref 38.5–50.0)
HEMOGLOBIN: 11.6 g/dL — AB (ref 13.2–17.1)
LYMPHS ABS: 888 {cells}/uL (ref 850–3900)
LYMPHS PCT: 8 %
MCH: 27.9 pg (ref 27.0–33.0)
MCHC: 32.4 g/dL (ref 32.0–36.0)
MCV: 86.1 fL (ref 80.0–100.0)
MONO ABS: 666 {cells}/uL (ref 200–950)
MPV: 11.5 fL (ref 7.5–12.5)
Monocytes Relative: 6 %
Neutro Abs: 9324 cells/uL (ref 1500–7800)
Neutrophils Relative %: 84 %
Platelets: 597 10*3/uL — ABNORMAL HIGH (ref 140–400)
RBC: 4.16 MIL/uL — AB (ref 4.20–5.80)
RDW: 20 % — AB (ref 11.0–15.0)
WBC: 11.1 10*3/uL — AB (ref 3.8–10.8)

## 2016-02-08 LAB — COMPREHENSIVE METABOLIC PANEL
ALBUMIN: 4.1 g/dL (ref 3.6–5.1)
ALK PHOS: 62 U/L (ref 40–115)
ALT: 30 U/L (ref 9–46)
AST: 34 U/L (ref 10–35)
BILIRUBIN TOTAL: 1.1 mg/dL (ref 0.2–1.2)
BUN: 50 mg/dL — ABNORMAL HIGH (ref 7–25)
CALCIUM: 9 mg/dL (ref 8.6–10.3)
CO2: 27 mmol/L (ref 20–31)
CREATININE: 1.18 mg/dL (ref 0.70–1.18)
Chloride: 98 mmol/L (ref 98–110)
Glucose, Bld: 147 mg/dL — ABNORMAL HIGH (ref 65–99)
Potassium: 5 mmol/L (ref 3.5–5.3)
SODIUM: 136 mmol/L (ref 135–146)
TOTAL PROTEIN: 6.6 g/dL (ref 6.1–8.1)

## 2016-02-08 LAB — PROTIME-INR
INR: 3.1 — AB
PROTHROMBIN TIME: 31.3 s — AB (ref 9.0–11.5)

## 2016-02-08 LAB — TSH: TSH: 0.49 mIU/L (ref 0.40–4.50)

## 2016-02-14 ENCOUNTER — Telehealth: Payer: Self-pay | Admitting: Physician Assistant

## 2016-02-14 DIAGNOSIS — Z95810 Presence of automatic (implantable) cardiac defibrillator: Secondary | ICD-10-CM | POA: Diagnosis not present

## 2016-02-14 DIAGNOSIS — I255 Ischemic cardiomyopathy: Secondary | ICD-10-CM | POA: Diagnosis not present

## 2016-02-14 MED ORDER — MUPIROCIN CALCIUM 2 % EX CREA: 1.0000 "application " | TOPICAL_CREAM | Freq: Two times a day (BID) | CUTANEOUS | 2 refills | Status: DC

## 2016-02-14 NOTE — Telephone Encounter (Signed)
Patient calling stating that he has an ulcer on his buttocks from sitting so much since his surgery with Dr. Lucia Gaskins.   Will send in Bactroban for him to use, try to not put pressure on the area, switch sides/positions. If it is not better follow up in the office. Needs INR checked this week, if not checked at cardio needs to come in for stat INR.

## 2016-02-15 ENCOUNTER — Other Ambulatory Visit: Payer: Self-pay | Admitting: Physician Assistant

## 2016-02-15 MED ORDER — SILVER SULFADIAZINE 1 % EX CREA: 1.0000 "application " | TOPICAL_CREAM | Freq: Every day | CUTANEOUS | 2 refills | Status: DC

## 2016-02-18 DIAGNOSIS — I4891 Unspecified atrial fibrillation: Secondary | ICD-10-CM | POA: Diagnosis not present

## 2016-02-18 DIAGNOSIS — I255 Ischemic cardiomyopathy: Secondary | ICD-10-CM | POA: Diagnosis not present

## 2016-02-18 DIAGNOSIS — Z95810 Presence of automatic (implantable) cardiac defibrillator: Secondary | ICD-10-CM | POA: Diagnosis not present

## 2016-02-21 NOTE — Discharge Summary (Signed)
[name] [name], PLACHY NO.:  [id number]  MEDICAL RECORD NO.:  [id number]  LOCATION:                               FACILITY:  [facility]  PHYSICIAN:  Leonides Sake. Lucia Gaskins, M.D.DATE OF BIRTH:  27-Dec-1940  DATE OF ADMISSION:  01/30/2016 DATE OF DISCHARGE:  01/31/2016  OPERATIVE REPORT   PREOPERATIVE DIAGNOSES: 1. Obstructive sleep apnea. 2. Chronic nasal obstruction with a deviated septum and turbinate     hypertrophy. 3. History of chronic atrial fibrillation with pacemaker and on     Coumadin. 4. Hypertension. 5. Hypothyroidism. 6. History of coronary artery disease.  PROCEDURE DURING THIS HOSPITALIZATION:  Septoplasty with bilateral inferior turbinate reductions and uvulectomy on December 30, 2015.  HOSPITAL COURSE:  The patient was admitted via the operating room on December 30, 2015, at which time, he underwent a septoplasty and turbinate reductions, because of chronic nasal obstruction, not able to tolerate nasal CPAP for his obstructive sleep apnea.  At the same time of the septoplasty and turbinate reductions, he had an uvulectomy performed as he had a very little elongated uvula.  The patient underwent septoplasty, turbinate reductions and uvulectomy on January 30, 2016, without any complications.  Postoperatively, he was admitted to the Step- Down Unit for 24-hour observation.  He was maintained on Ancef 1 g IV q.8 hours during this hospitalization.  He had no significant airway problems with sats above 90 and no significant bleeding.  His nasal packing was removed on January 31, 2016, and the patient was subsequently discharged home.  The patient was discharged home on his regular medications.  He was instructed to restart his Coumadin, which had been stopped for 5 days, on the day after discharge on Friday, 7.5 mg daily.  He was additionally given Keflex 500 mg b.i.d. for 1 week and instructed to take Tylenol, Motrin or hydrocodone 5 mg tablets 1-2  q.6 hours p.r.n. pain.  I will have him follow up in my office in 1 week for recheck.    ______________________________ Leonides Sake. Lucia Gaskins, M.D.   ______________________________ Leonides Sake. Lucia Gaskins, M.D.    CEN/MEDQ  D:  01/31/2016  T:  02/01/2016  Job:  [id number]

## 2016-02-25 DIAGNOSIS — I255 Ischemic cardiomyopathy: Secondary | ICD-10-CM | POA: Diagnosis not present

## 2016-02-25 DIAGNOSIS — I4891 Unspecified atrial fibrillation: Secondary | ICD-10-CM | POA: Diagnosis not present

## 2016-02-25 DIAGNOSIS — Z95810 Presence of automatic (implantable) cardiac defibrillator: Secondary | ICD-10-CM | POA: Diagnosis not present

## 2016-02-25 DIAGNOSIS — Z7901 Long term (current) use of anticoagulants: Secondary | ICD-10-CM | POA: Diagnosis not present

## 2016-02-27 ENCOUNTER — Other Ambulatory Visit: Payer: Self-pay | Admitting: *Deleted

## 2016-02-27 MED ORDER — OMEPRAZOLE 20 MG PO CPDR: 20.0000 mg | DELAYED_RELEASE_CAPSULE | Freq: Two times a day (BID) | ORAL | 1 refills | Status: DC

## 2016-02-27 MED ORDER — ALLOPURINOL 300 MG PO TABS: 300.0000 mg | ORAL_TABLET | Freq: Every day | ORAL | 3 refills | Status: DC

## 2016-02-27 MED ORDER — LOSARTAN POTASSIUM 50 MG PO TABS: 50.0000 mg | ORAL_TABLET | Freq: Every day | ORAL | 1 refills | Status: DC

## 2016-02-27 MED ORDER — LEVOTHYROXINE SODIUM 200 MCG PO TABS: 200.0000 ug | ORAL_TABLET | Freq: Every day | ORAL | 3 refills | Status: DC

## 2016-02-27 MED ORDER — FINASTERIDE 5 MG PO TABS: 5.0000 mg | ORAL_TABLET | Freq: Every day | ORAL | 3 refills | Status: DC

## 2016-02-27 MED ORDER — FUROSEMIDE 40 MG PO TABS: 40.0000 mg | ORAL_TABLET | Freq: Every day | ORAL | 1 refills | Status: DC

## 2016-03-07 ENCOUNTER — Ambulatory Visit: Payer: Self-pay | Admitting: Physician Assistant

## 2016-03-13 ENCOUNTER — Encounter: Payer: Self-pay | Admitting: Physician Assistant

## 2016-03-13 ENCOUNTER — Ambulatory Visit: Payer: Medicare Other | Admitting: Physician Assistant

## 2016-03-13 VITALS — BP 136/70 | HR 83 | Temp 97.5°F | Resp 16 | Ht 69.0 in | Wt 264.0 lb

## 2016-03-13 DIAGNOSIS — I808 Phlebitis and thrombophlebitis of other sites: Secondary | ICD-10-CM

## 2016-03-13 DIAGNOSIS — I255 Ischemic cardiomyopathy: Secondary | ICD-10-CM | POA: Diagnosis not present

## 2016-03-13 DIAGNOSIS — Z7901 Long term (current) use of anticoagulants: Secondary | ICD-10-CM | POA: Diagnosis not present

## 2016-03-13 DIAGNOSIS — I4891 Unspecified atrial fibrillation: Secondary | ICD-10-CM | POA: Diagnosis not present

## 2016-03-13 DIAGNOSIS — Z95810 Presence of automatic (implantable) cardiac defibrillator: Secondary | ICD-10-CM | POA: Diagnosis not present

## 2016-03-13 NOTE — Progress Notes (Signed)
Assessment and Plan: Hospital visit follow up for Nasal septoplasty- he is healing well from the surgery INr is checked at cardio and it was 1.9 last time.   Future Appointments Date Time Provider [place]  04/16/2016 4:00 PM [place], PA-C [facility] None  02/18/2017 3:00 PM [name], MD [facility] None    HPI 75 y.o.male presents for follow up, had nasal septoplasty with Dr. Lucia Gaskins for sleep apnea on 09/13. He has history of afib. He was put on clindamycin and diflucan for superifical thrombophlebitis of left arm and thrush. Follows up today doing well, has appt in a month, will keep that since wellness  Lab Results  Component Value Date   INR 3.1 (H) 02/07/2016   INR 1.27 01/30/2016   INR 1.96 (H) 06/21/2015   BMI is Body mass index is 38.99 kg/m., he is working on diet and exercise. Wt Readings from Last 3 Encounters:  03/13/16 264 lb (119.7 kg)  02/07/16 255 lb 9.6 oz (115.9 kg)  01/30/16 262 lb (118.8 kg)    Blood pressure 136/70, pulse 83, temperature 97.5 F (36.4 C), resp. rate 16, height 5\' 9"  (1.753 m), weight 264 lb (119.7 kg), SpO2 96 %.   Past Medical History:  Diagnosis Date  . A-fib ([place]) 01/2014  . AICD (automatic cardioverter/defibrillator) present    [employer]  . Arthritis   . ASHD (arteriosclerotic heart disease) 2015   s/p CABG  . Cataract    s/p left  . COPD (chronic obstructive pulmonary disease) ([place])    by CXR, pt unsure of this  . GERD (gastroesophageal reflux disease)   . Hyperlipidemia   . Hypertension   . Hypothyroidism   . LBBB (left bundle branch block)   . Pre-diabetes   . Prediabetes 01/2014  . Presence of permanent cardiac pacemaker   . Sleep apnea    no CPAP  . Thyroid disease    hypothyroid     Allergies  Allergen Reactions  . Other Swelling    SODIUM SENSITIVE      Current Outpatient Prescriptions on File Prior to Visit  Medication Sig Dispense Refill  . acetaminophen (TYLENOL) 500 MG  tablet Take 500 mg by mouth every 6 (six) hours as needed for mild pain.     Marland Kitchen albuterol (PROVENTIL HFA;VENTOLIN HFA) 108 (90 BASE) MCG/ACT inhaler Inhale 2 puffs into the lungs every 6 (six) hours as needed for wheezing or shortness of breath.    . allopurinol (ZYLOPRIM) 300 MG tablet Take 1 tablet (300 mg total) by mouth daily. 90 tablet 3  . aspirin EC 81 MG tablet Take 81 mg by mouth daily.    Marland Kitchen atorvastatin (LIPITOR) 80 MG tablet Take 1/2 to 1 tablet daily or as directed for Cholesterol (Patient taking differently: Take 40 mg by mouth daily at 6 PM. ) 90 tablet 11  . Black Pepper-Turmeric (TURMERIC COMPLEX/BLACK PEPPER PO) Take 1 capsule by mouth daily.    . Cholecalciferol (VITAMIN D PO) Take 15,000 Units by mouth daily.     . clindamycin (CLEOCIN) 150 MG capsule Take by mouth 3 (three) times daily.    . diphenhydrAMINE (BENADRYL) 25 MG tablet Take 25 mg by mouth at bedtime.    . docusate sodium (COLACE) 100 MG capsule Take 100 mg by mouth 2 (two) times daily.    Marland Kitchen doxazosin (CARDURA) 4 MG tablet Take 1 tablet (4 mg total) by mouth daily. 90 tablet 4  . finasteride (PROSCAR) 5 MG tablet Take 1  tablet (5 mg total) by mouth daily. 90 tablet 3  . fluticasone (FLONASE) 50 MCG/ACT nasal spray Place 2 sprays into both nostrils at bedtime. PRN     . furosemide (LASIX) 40 MG tablet Take 1 tablet (40 mg total) by mouth daily. 90 tablet 1  . HYDROcodone-acetaminophen (NORCO/VICODIN) 5-325 MG tablet Take 1-2 tablets by mouth every 6 (six) hours as needed for moderate pain. 24 tablet 0  . levothyroxine (SYNTHROID, LEVOTHROID) 200 MCG tablet Take 1 tablet (200 mcg total) by mouth daily before breakfast. 90 tablet 3  . losartan (COZAAR) 50 MG tablet Take 1 tablet (50 mg total) by mouth daily. 90 tablet 1  . metoprolol succinate (TOPROL-XL) 50 MG 24 hr tablet Take 1 tablet (50 mg total) by mouth daily. Take with or immediately following a meal. 90 tablet 4  . omeprazole (PRILOSEC) 20 MG capsule Take 1  capsule (20 mg total) by mouth 2 (two) times daily. 180 capsule 1  . OVER THE COUNTER MEDICATION daily. Vitamin A to Z 50 plus    . polyethylene glycol (MIRALAX / GLYCOLAX) packet Take 17 g by mouth daily.    . silver sulfADIAZINE (SILVADENE) 1 % cream Apply 1 application topically daily. 50 g 2  . spironolactone (ALDACTONE) 25 MG tablet Take 1 tablet (25 mg total) by mouth daily. 90 tablet 1  . warfarin (COUMADIN) 5 MG tablet Takes 7.5 mg daily or as directed. 145 tablet 1   No current facility-administered medications on file prior to visit.     Vicie Mutters, PA-C 3:56 PM [facility]

## 2016-04-03 DIAGNOSIS — Z95810 Presence of automatic (implantable) cardiac defibrillator: Secondary | ICD-10-CM | POA: Diagnosis not present

## 2016-04-03 DIAGNOSIS — Z7901 Long term (current) use of anticoagulants: Secondary | ICD-10-CM | POA: Diagnosis not present

## 2016-04-03 DIAGNOSIS — I255 Ischemic cardiomyopathy: Secondary | ICD-10-CM | POA: Diagnosis not present

## 2016-04-03 DIAGNOSIS — I4891 Unspecified atrial fibrillation: Secondary | ICD-10-CM | POA: Diagnosis not present

## 2016-04-16 ENCOUNTER — Ambulatory Visit (INDEPENDENT_AMBULATORY_CARE_PROVIDER_SITE_OTHER): Payer: Medicare Other | Admitting: Internal Medicine

## 2016-04-16 ENCOUNTER — Encounter: Payer: Self-pay | Admitting: Internal Medicine

## 2016-04-16 VITALS — BP 138/74 | HR 82 | Temp 98.0°F | Resp 18 | Ht 69.0 in | Wt 264.0 lb

## 2016-04-16 DIAGNOSIS — R6889 Other general symptoms and signs: Secondary | ICD-10-CM

## 2016-04-16 DIAGNOSIS — R200 Anesthesia of skin: Secondary | ICD-10-CM | POA: Diagnosis not present

## 2016-04-16 DIAGNOSIS — Z5181 Encounter for therapeutic drug level monitoring: Secondary | ICD-10-CM | POA: Diagnosis not present

## 2016-04-16 DIAGNOSIS — I4891 Unspecified atrial fibrillation: Secondary | ICD-10-CM

## 2016-04-16 DIAGNOSIS — G4733 Obstructive sleep apnea (adult) (pediatric): Secondary | ICD-10-CM | POA: Diagnosis not present

## 2016-04-16 DIAGNOSIS — R7303 Prediabetes: Secondary | ICD-10-CM | POA: Diagnosis not present

## 2016-04-16 DIAGNOSIS — Z79899 Other long term (current) drug therapy: Secondary | ICD-10-CM | POA: Diagnosis not present

## 2016-04-16 DIAGNOSIS — Z0001 Encounter for general adult medical examination with abnormal findings: Secondary | ICD-10-CM | POA: Diagnosis not present

## 2016-04-16 DIAGNOSIS — E039 Hypothyroidism, unspecified: Secondary | ICD-10-CM | POA: Diagnosis not present

## 2016-04-16 DIAGNOSIS — I10 Essential (primary) hypertension: Secondary | ICD-10-CM

## 2016-04-16 DIAGNOSIS — Z950 Presence of cardiac pacemaker: Secondary | ICD-10-CM

## 2016-04-16 DIAGNOSIS — E559 Vitamin D deficiency, unspecified: Secondary | ICD-10-CM

## 2016-04-16 DIAGNOSIS — N401 Enlarged prostate with lower urinary tract symptoms: Secondary | ICD-10-CM | POA: Diagnosis not present

## 2016-04-16 DIAGNOSIS — E782 Mixed hyperlipidemia: Secondary | ICD-10-CM

## 2016-04-16 DIAGNOSIS — Z0000 Encounter for general adult medical examination without abnormal findings: Secondary | ICD-10-CM

## 2016-04-16 DIAGNOSIS — K219 Gastro-esophageal reflux disease without esophagitis: Secondary | ICD-10-CM | POA: Diagnosis not present

## 2016-04-16 DIAGNOSIS — Z7901 Long term (current) use of anticoagulants: Secondary | ICD-10-CM

## 2016-04-16 DIAGNOSIS — I2510 Atherosclerotic heart disease of native coronary artery without angina pectoris: Secondary | ICD-10-CM

## 2016-04-16 LAB — BASIC METABOLIC PANEL WITH GFR
BUN: 15 mg/dL (ref 7–25)
CALCIUM: 9.9 mg/dL (ref 8.6–10.3)
CO2: 31 mmol/L (ref 20–31)
Chloride: 101 mmol/L (ref 98–110)
Creat: 0.94 mg/dL (ref 0.70–1.18)
GFR, EST NON AFRICAN AMERICAN: 79 mL/min (ref >=60)
Glucose, Bld: 94 mg/dL (ref 65–99)
Potassium: 4.7 mmol/L (ref 3.5–5.3)
SODIUM: 138 mmol/L (ref 135–146)

## 2016-04-16 LAB — HEPATIC FUNCTION PANEL
ALT: 24 U/L (ref 9–46)
AST: 28 U/L (ref 10–35)
Albumin: 4.6 g/dL (ref 3.6–5.1)
Alkaline Phosphatase: 45 U/L (ref 40–115)
BILIRUBIN DIRECT: 0.2 mg/dL (ref <=0.2)
BILIRUBIN INDIRECT: 0.6 mg/dL (ref 0.2–1.2)
Total Bilirubin: 0.8 mg/dL (ref 0.2–1.2)
Total Protein: 6.9 g/dL (ref 6.1–8.1)

## 2016-04-16 LAB — LIPID PANEL
Cholesterol: 143 mg/dL (ref <200)
HDL: 20 mg/dL — AB (ref >40)
LDL CALC: 86 mg/dL (ref <100)
TRIGLYCERIDES: 183 mg/dL — AB (ref <150)
Total CHOL/HDL Ratio: 7.2 Ratio — ABNORMAL HIGH (ref <5.0)
VLDL: 37 mg/dL — AB (ref <30)

## 2016-04-16 NOTE — Progress Notes (Signed)
MEDICARE ANNUAL WELLNESS VISIT AND FOLLOW UP Assessment:    1. Bilateral hand numbness -possible bilateral carpal tunnel with positive phalens sign vs. Radiculopathy from the neck -may need EMG nerve study - Ambulatory referral to Neurology  2. ASHD/CABG x 3V (11/2013) -followed by cards -on statin therapy  -cont BP and cholesterol management  3. Atrial fibrillation, unspecified type ([place]) -cont rate control -cont coumadin  4. Essential hypertension -cont BP meds -dash diet -exercise as tolerated -monitor at home  5. Obstructive sleep apnea of adult -cont use of CPAP   6. OSA (obstructive sleep apnea) -cont use of CPAP   7. Gastroesophageal reflux disease, esophagitis presence not specified -cont meds -avoid trigger foods  8. Hypothyroidism, unspecified type -cont levothyroxine - TSH  9. Benign prostatic hyperplasia with lower urinary tract symptoms, symptom details unspecified -cont finasteride  10. Cardiac pacemaker/AICD (01/2014) -followed by cards  11. Encounter for monitoring coumadin therapy -followed by cards  12. Medication management  - CBC with Differential/Platelet - BASIC METABOLIC PANEL WITH GFR - Hepatic function panel  13. Mixed hyperlipidemia -cont statin therapy - Lipid panel  14. Morbid obesity (36.52) -cont diet and exercise  15. Prediabetes -cont diet and exercise - Hemoglobin A1c  16. Vitamin D deficiency -cont Vit D    Over 30 minutes of exam, counseling, chart review, and critical decision making was performed  Future Appointments Date Time Provider [place]  07/24/2016 11:15 AM [name], MD [facility] None  02/18/2017 3:00 PM [name], MD [facility] None     Plan:   During the course of the visit the patient was educated and counseled about appropriate screening and preventive services including:    Pneumococcal vaccine   Influenza vaccine  Prevnar 13  Td vaccine  Screening  electrocardiogram  Colorectal cancer screening  Diabetes screening  Glaucoma screening  Nutrition counseling    Subjective:  [name] [name] is a 75 y.o. male who presents for Medicare Annual Wellness Visit and 3 month follow up for HTN, hyperlipidemia, prediabetes, and vitamin D Def.   His blood pressure has been controlled at home, today their BP is BP: 138/74 He does not workout. He denies chest pain, shortness of breath, dizziness.    He is on cholesterol medication and denies myalgias. His cholesterol is at goal. The cholesterol last visit was:   Lab Results  Component Value Date   CHOL 143 04/16/2016   HDL 20 (L) 04/16/2016   LDLCALC 86 04/16/2016   TRIG 183 (H) 04/16/2016   CHOLHDL 7.2 (H) 04/16/2016   A1C levels have been well controlled with diet and exercise.  He is not currently taking medications to manage his blood sugars.  He is watching his diet and attempting to eat a high vegetable diet.     Lab Results  Component Value Date   HGBA1C 5.5 04/16/2016   Last GFR Lab Results  Component Value Date   GFRNONAA 79 04/16/2016     Lab Results  Component Value Date   GFRAA >89 04/16/2016   [name]   Lab Results  Component Value Date   VD25OH 81 01/03/2016     He has no further issue with the thrombophlebitis, and no issues with the thrush.  He is healing well from his septoplasty.    He reports that he has been having some issues with making a fist with his left hand.  He reports that he has some weakness in the hand. He has  some tingling and humbness in his hand.  It is generally the thumb index and middle fingers.  He reports that he moves a lot when he sleeps.  He reports that he is woken up by his arms.    Medication Review: Current Outpatient Prescriptions on File Prior to Visit  Medication Sig Dispense Refill  . acetaminophen (TYLENOL) 500 MG tablet Take 500 mg by mouth every 6 (six) hours as needed for mild pain.     Marland Kitchen  albuterol (PROVENTIL HFA;VENTOLIN HFA) 108 (90 BASE) MCG/ACT inhaler Inhale 2 puffs into the lungs every 6 (six) hours as needed for wheezing or shortness of breath.    . allopurinol (ZYLOPRIM) 300 MG tablet Take 1 tablet (300 mg total) by mouth daily. 90 tablet 3  . aspirin EC 81 MG tablet Take 81 mg by mouth daily.    Marland Kitchen atorvastatin (LIPITOR) 80 MG tablet Take 1/2 to 1 tablet daily or as directed for Cholesterol (Patient taking differently: Take 40 mg by mouth daily at 6 PM. ) 90 tablet 11  . Black Pepper-Turmeric (TURMERIC COMPLEX/BLACK PEPPER PO) Take 1 capsule by mouth daily.    . Cholecalciferol (VITAMIN D PO) Take 15,000 Units by mouth daily.     . diphenhydrAMINE (BENADRYL) 25 MG tablet Take 25 mg by mouth at bedtime.    . docusate sodium (COLACE) 100 MG capsule Take 100 mg by mouth 2 (two) times daily.    Marland Kitchen doxazosin (CARDURA) 4 MG tablet Take 1 tablet (4 mg total) by mouth daily. 90 tablet 4  . finasteride (PROSCAR) 5 MG tablet Take 1 tablet (5 mg total) by mouth daily. 90 tablet 3  . fluticasone (FLONASE) 50 MCG/ACT nasal spray Place 2 sprays into both nostrils at bedtime. PRN     . furosemide (LASIX) 40 MG tablet Take 1 tablet (40 mg total) by mouth daily. 90 tablet 1  . HYDROcodone-acetaminophen (NORCO/VICODIN) 5-325 MG tablet Take 1-2 tablets by mouth every 6 (six) hours as needed for moderate pain. 24 tablet 0  . levothyroxine (SYNTHROID, LEVOTHROID) 200 MCG tablet Take 1 tablet (200 mcg total) by mouth daily before breakfast. 90 tablet 3  . losartan (COZAAR) 50 MG tablet Take 1 tablet (50 mg total) by mouth daily. 90 tablet 1  . metoprolol succinate (TOPROL-XL) 50 MG 24 hr tablet Take 1 tablet (50 mg total) by mouth daily. Take with or immediately following a meal. 90 tablet 4  . omeprazole (PRILOSEC) 20 MG capsule Take 1 capsule (20 mg total) by mouth 2 (two) times daily. 180 capsule 1  . OVER THE COUNTER MEDICATION daily. Vitamin A to Z 50 plus    . polyethylene glycol (MIRALAX /  GLYCOLAX) packet Take 17 g by mouth daily.    . silver sulfADIAZINE (SILVADENE) 1 % cream Apply 1 application topically daily. 50 g 2  . spironolactone (ALDACTONE) 25 MG tablet Take 1 tablet (25 mg total) by mouth daily. 90 tablet 1  . warfarin (COUMADIN) 5 MG tablet Takes 7.5 mg daily or as directed. 145 tablet 1   No current facility-administered medications on file prior to visit.     Allergies: Allergies  Allergen Reactions  . Other Swelling    SODIUM SENSITIVE    Current Problems (verified) has HTN (hypertension); Mixed hyperlipidemia; Prediabetes; Vitamin D deficiency; GERD ; BPH (benign prostatic hyperplasia); Medication management; Atrial fibrillation ([place]); Hypothyroidism; OSA (obstructive sleep apnea); Morbid obesity (36.52); ASHD/CABG x 3V (11/2013); Encounter for monitoring coumadin therapy; Cardiac pacemaker/AICD (01/2014); and  Obstructive sleep apnea of adult on his problem list.  Screening Tests Immunization History  Administered Date(s) Administered  . Influenza, High Dose Seasonal PF 04/23/2015  . Influenza,inj,Quad PF,36+ Mos 01/31/2016  . Pneumococcal Conjugate-13 11/21/2014    Preventative care: Last colonoscopy: 2008  Prior vaccinations:  Influenza: 2017  Prevnar13: 2016   Names of Other Physician/Practitioners you currently use: 1. [facility] here for primary care 2. , eye doctor, last visit  3. , dentist, last visit  Patient Care Team: [name], MD as PCP - General (Internal Medicine)  Surgical: He  has a past surgical history that includes Coronary artery bypass graft (11/2013); lumb laminectomy [id number]); Lumbar fusion (2013); Cardiac defibrillator placement (07/2014); Cataract extraction w/ intraocular lens implant (Left); Nasal septoplasty w/ turbinoplasty (Bilateral, 01/30/2016); and Uvulectomy (N/A, 01/30/2016). Family His family history includes Alcohol abuse in his son; Alzheimer's disease in his  mother; Depression in his mother; Heart disease (age of onset: 2) in his father; Mental illness in his mother; Stroke in his maternal grandmother. Social history  He reports that he quit smoking about 32 years ago. He has never used smokeless tobacco. He reports that he does not drink alcohol or use drugs.  MEDICARE WELLNESS OBJECTIVES: Physical activity:   Cardiac risk factors:   Depression/mood screen:   Depression screen [facility] 2/9 01/04/2016  Decreased Interest 0  Down, Depressed, Hopeless 0  PHQ - 2 Score 0    ADLs:  In your present state of health, do you have any difficulty performing the following activities: 01/23/2016 01/04/2016  Hearing? Y N  Vision? N N  Difficulty concentrating or making decisions? N N  Walking or climbing stairs? Y N  Dressing or bathing? N N  Doing errands, shopping? N N  Preparing Food and eating ? - -  Using the Toilet? - -  In the past six months, have you accidently leaked urine? - -  Do you have problems with loss of bowel control? - -  Managing your Medications? - -  Managing your Finances? - -  Housekeeping or managing your Housekeeping? - -  Some recent data might be hidden     Cognitive Testing  Alert? Yes  Normal Appearance?Yes  Oriented to person? Yes  Place? Yes   Time? Yes  Recall of three objects?  Yes  Can perform simple calculations? Yes  Displays appropriate judgment?Yes  Can read the correct time from a watch face?Yes  EOL planning:     Objective:   Today's Vitals   04/16/16 1550  BP: 138/74  Pulse: 82  Resp: 18  Temp: 98 F (36.7 C)  TempSrc: Temporal  Weight: 264 lb (119.7 kg)  Height: 5\' 9"  (1.753 m)   Body mass index is 38.99 kg/m.  General appearance: alert, no distress, WD/WN, male HEENT: normocephalic, sclerae anicteric, TMs pearly, nares patent, no discharge or erythema, pharynx normal Oral cavity: MMM, no lesions Neck: supple, no lymphadenopathy, no thyromegaly, no masses Heart: RRR, normal S1, S2, no  murmurs Lungs: CTA bilaterally, no wheezes, rhonchi, or rales Abdomen: +bs, soft, non tender, non distended, no masses, no hepatomegaly, no splenomegaly Musculoskeletal: nontender, no swelling, no obvious deformity Extremities: no edema, no cyanosis, no clubbing Pulses: 2+ symmetric, upper and lower extremities, normal cap refill Neurological: alert, oriented x 3, CN2-12 intact, strength normal upper extremities and lower extremities, sensation normal throughout, DTRs 2+ throughout, no cerebellar signs, gait normal Psychiatric: normal affect, behavior normal, pleasant   Medicare Attestation I  have personally reviewed: The patient's medical and social history Their use of alcohol, tobacco or illicit drugs Their current medications and supplements The patient's functional ability including ADLs,fall risks, home safety risks, cognitive, and hearing and visual impairment Diet and physical activities Evidence for depression or mood disorders  The patient's weight, height, BMI, and visual acuity have been recorded in the chart.  I have made referrals, counseling, and provided education to the patient based on review of the above and I have provided the patient with a written personalized care plan for preventive services.     Starlyn Skeans, PA-C   04/20/2016

## 2016-04-17 LAB — CBC WITH DIFFERENTIAL/PLATELET
BASOS PCT: 1 %
Basophils Absolute: 89 cells/uL (ref 0–200)
EOS PCT: 3 %
Eosinophils Absolute: 267 cells/uL (ref 15–500)
HCT: 40 % (ref 38.5–50.0)
HEMOGLOBIN: 12.9 g/dL — AB (ref 13.2–17.1)
LYMPHS ABS: 1602 {cells}/uL (ref 850–3900)
Lymphocytes Relative: 18 %
MCH: 28.1 pg (ref 27.0–33.0)
MCHC: 32.3 g/dL (ref 32.0–36.0)
MCV: 87.1 fL (ref 80.0–100.0)
MPV: 10.9 fL (ref 7.5–12.5)
Monocytes Absolute: 445 cells/uL (ref 200–950)
Monocytes Relative: 5 %
NEUTROS ABS: 6497 {cells}/uL (ref 1500–7800)
NEUTROS PCT: 73 %
Platelets: 495 10*3/uL — ABNORMAL HIGH (ref 140–400)
RBC: 4.59 MIL/uL (ref 4.20–5.80)
RDW: 19.7 % — ABNORMAL HIGH (ref 11.0–15.0)
WBC: 8.9 10*3/uL (ref 3.8–10.8)

## 2016-04-17 LAB — HEMOGLOBIN A1C
Hgb A1c MFr Bld: 5.5 % (ref <5.7)
Mean Plasma Glucose: 111 mg/dL

## 2016-04-17 LAB — TSH: TSH: 4.9 mIU/L — ABNORMAL HIGH (ref 0.40–4.50)

## 2016-04-22 ENCOUNTER — Other Ambulatory Visit: Payer: Self-pay | Admitting: *Deleted

## 2016-04-22 DIAGNOSIS — Z1211 Encounter for screening for malignant neoplasm of colon: Secondary | ICD-10-CM

## 2016-04-22 LAB — POC HEMOCCULT BLD/STL (HOME/3-CARD/SCREEN)
Card #2 Fecal Occult Blod, POC: NEGATIVE
Card #3 Fecal Occult Blood, POC: NEGATIVE
FECAL OCCULT BLD: NEGATIVE

## 2016-05-01 ENCOUNTER — Other Ambulatory Visit: Payer: Self-pay | Admitting: *Deleted

## 2016-05-01 DIAGNOSIS — Z95810 Presence of automatic (implantable) cardiac defibrillator: Secondary | ICD-10-CM | POA: Diagnosis not present

## 2016-05-01 DIAGNOSIS — I255 Ischemic cardiomyopathy: Secondary | ICD-10-CM | POA: Diagnosis not present

## 2016-05-01 DIAGNOSIS — I4891 Unspecified atrial fibrillation: Secondary | ICD-10-CM | POA: Diagnosis not present

## 2016-05-01 DIAGNOSIS — Z7901 Long term (current) use of anticoagulants: Secondary | ICD-10-CM | POA: Diagnosis not present

## 2016-05-01 MED ORDER — DOXAZOSIN MESYLATE 4 MG PO TABS: 4.0000 mg | ORAL_TABLET | Freq: Every day | ORAL | 4 refills | Status: DC

## 2016-05-01 MED ORDER — SPIRONOLACTONE 25 MG PO TABS: 25.0000 mg | ORAL_TABLET | Freq: Every day | ORAL | 1 refills | Status: DC

## 2016-05-01 MED ORDER — WARFARIN SODIUM 5 MG PO TABS: ORAL_TABLET | ORAL | 1 refills | Status: DC

## 2016-05-14 DIAGNOSIS — Z4502 Encounter for adjustment and management of automatic implantable cardiac defibrillator: Secondary | ICD-10-CM | POA: Diagnosis not present

## 2016-05-14 DIAGNOSIS — I10 Essential (primary) hypertension: Secondary | ICD-10-CM | POA: Diagnosis not present

## 2016-05-14 DIAGNOSIS — I255 Ischemic cardiomyopathy: Secondary | ICD-10-CM | POA: Diagnosis not present

## 2016-05-14 DIAGNOSIS — Z95810 Presence of automatic (implantable) cardiac defibrillator: Secondary | ICD-10-CM | POA: Diagnosis not present

## 2016-05-22 DIAGNOSIS — I4891 Unspecified atrial fibrillation: Secondary | ICD-10-CM | POA: Diagnosis not present

## 2016-05-22 DIAGNOSIS — Z7901 Long term (current) use of anticoagulants: Secondary | ICD-10-CM | POA: Diagnosis not present

## 2016-05-22 DIAGNOSIS — Z95810 Presence of automatic (implantable) cardiac defibrillator: Secondary | ICD-10-CM | POA: Diagnosis not present

## 2016-05-22 DIAGNOSIS — I255 Ischemic cardiomyopathy: Secondary | ICD-10-CM | POA: Diagnosis not present

## 2016-05-26 ENCOUNTER — Encounter: Payer: Self-pay | Admitting: Neurology

## 2016-05-26 ENCOUNTER — Ambulatory Visit (INDEPENDENT_AMBULATORY_CARE_PROVIDER_SITE_OTHER): Payer: Medicare Other | Admitting: Neurology

## 2016-05-26 VITALS — BP 148/64 | HR 61 | Ht 69.0 in | Wt 267.0 lb

## 2016-05-26 DIAGNOSIS — M542 Cervicalgia: Secondary | ICD-10-CM | POA: Diagnosis not present

## 2016-05-26 DIAGNOSIS — M5412 Radiculopathy, cervical region: Secondary | ICD-10-CM

## 2016-05-26 DIAGNOSIS — R29898 Other symptoms and signs involving the musculoskeletal system: Secondary | ICD-10-CM

## 2016-05-26 NOTE — Patient Instructions (Signed)
Remember to drink plenty of fluid, eat healthy meals and do not skip any meals. Try to eat protein with a every meal and eat a healthy snack such as fruit or nuts in between meals. Try to keep a regular sleep-wake schedule and try to exercise daily, particularly in the form of walking, 20-30 minutes a day, if you can.   As far as diagnostic testing: physical therapy, emg/ncs  I would like to see you back for emg/ncs, sooner if we need to. Please call us with any interim questions, concerns, problems, updates or refill requests.   Our phone number is [phone]. We also have an after hours call service for urgent matters and there is a physician on-call for urgent questions. For any emergencies you know to call 911 or go to the nearest emergency room

## 2016-05-26 NOTE — Progress Notes (Signed)
[facility]    Provider:  Dr Jaynee Eagles Referring Provider: [name], MD Primary Care Physician:  Alesia Richards, MD  CC:  Arm and hand numbness  HPI:  [name] [name] is a 76 y.o. male here as a referral from Dr. Melford Aase for bilateral hand numbness. This is a patient with a past medical history of atrial fibrillation on Coumadin, hypertension, obstructive sleep apnea on CPAP, hypothyroidism, coronary artery disease status post cardiac pacemaker/AICD, prediabetes, morbid obesity, vitamin D deficiency, lumbar laminectomy [id number]) and lumbar fusion (2013) who is here for bilateral hand numbness. Patient was diagnosed with gout a while back, pain in the joints and is on allopurinal now. Pain in the hands has worsened, he can;t make a fist with his left hand. He has numbness in digits 1-3 of both hands with radiation into the forearms. When he goes to bed at night, he feels like he is on a 'spit" turning all night long because his arms go numb. He has neck pain with radiation into his arms. He has tried to improve his posture and that has helped. He has slight numbness in the hands now but his main symptoms are in bed, his hands get numb and he has to move them around. He feels this has progressed to the arms and the neck hurts as well. He has a lot of muscle neck pain as well improved with massage. He has significant weakness in the left hand, can't even hold his pills. Worse at night, better during the day, no other associated symptoms, focal neurologic deficits or modifiable factors, no changes in bowel or bladder.   Reviewed notes, labs and imaging from outside physicians, which showed: Reviewed primary care notes which showed possible bilateral carpal tunnel with positive Phalen maneuver versus radiculopathy from the neck, he was referred here for possible EMG nerve study. Patient reported that he was having issues with making a fist with his left hand, and some  weakness in the hand, tingling and numbness in his hand as well. He reports it is mostly the thumb and middle fingers, worse at night, as woken up by the pain at night. He takes Tylenol when necessary for the pain. PMHx lumb laminectomy [id number]); Lumbar fusion (2013);   LDL 86, A1c 5.5, BUN 15, creatinine 0.94. Labs were drawn 04/16/2016. TSH 4.9.  Review of Systems: Patient complains of symptoms per HPI as well as the following symptoms: Swelling in legs, cough, easy bruising, increased thirst, joint swelling, cramps, urination problems, numbness, runny nose. Pertinent negatives per HPI. All others negative.   Social History   Social History  . Marital status: Married    Spouse name: [name]   . Number of children: N/A  . Years of education: 10   Occupational History  . Retired    Social History Main Topics  . Smoking status: Former Smoker    Quit date: 05/20/1983  . Smokeless tobacco: Never Used  . Alcohol use 0.0 oz/week     Comment: occass  . Drug use: No  . Sexual activity: Not on file   Other Topics Concern  . Not on file   Social History Narrative   Lives with wife, [name]   Caffeine use: daily (Coffee)    Family History  Problem Relation Age of Onset  . Alzheimer's disease Mother   . Mental illness Mother   . Depression Mother   . Heart disease Father 94    had 5 MI's & died 4 yo  .  Alcohol abuse Son   . Stroke Maternal Grandmother     Past Medical History:  Diagnosis Date  . A-fib ([place]) 01/2014  . AICD (automatic cardioverter/defibrillator) present    [employer]  . Arthritis   . ASHD (arteriosclerotic heart disease) 2015   s/p CABG  . Cataract    s/p left  . COPD (chronic obstructive pulmonary disease) ([place])    by CXR, pt unsure of this  . GERD (gastroesophageal reflux disease)   . Hyperlipidemia   . Hypertension   . Hypothyroidism   . LBBB (left bundle branch block)   . Pre-diabetes   . Prediabetes 01/2014  . Presence of permanent  cardiac pacemaker   . Sleep apnea    no CPAP  . Thyroid disease    hypothyroid    Past Surgical History:  Procedure Laterality Date  . CARDIAC DEFIBRILLATOR PLACEMENT  07/2014  . CATARACT EXTRACTION W/ INTRAOCULAR LENS IMPLANT Left   . CORONARY ARTERY BYPASS GRAFT  11/2013  . lumb laminectomy  [phone]   x 3  . LUMBAR FUSION  2013  . NASAL SEPTOPLASTY W/ TURBINOPLASTY Bilateral 01/30/2016   Procedure: NASAL SEPTOPLASTY WITH BILATERAL TURBINATE REDUCTION;  Surgeon: Rozetta Nunnery, MD;  Location: [place];  Service: ENT;  Laterality: Bilateral;  . UVULECTOMY N/A 01/30/2016   Procedure: UVULECTOMY;  Surgeon: Rozetta Nunnery, MD;  Location: [place];  Service: ENT;  Laterality: N/A;    Current Outpatient Prescriptions  Medication Sig Dispense Refill  . acetaminophen (TYLENOL) 500 MG tablet Take 500 mg by mouth every 6 (six) hours as needed for mild pain.     Marland Kitchen albuterol (PROVENTIL HFA;VENTOLIN HFA) 108 (90 BASE) MCG/ACT inhaler Inhale 2 puffs into the lungs every 6 (six) hours as needed for wheezing or shortness of breath.    . allopurinol (ZYLOPRIM) 300 MG tablet Take 1 tablet (300 mg total) by mouth daily. 90 tablet 3  . aspirin EC 81 MG tablet Take 81 mg by mouth daily.    Marland Kitchen atorvastatin (LIPITOR) 80 MG tablet Take 1/2 to 1 tablet daily or as directed for Cholesterol (Patient taking differently: Take 40 mg by mouth daily at 6 PM. ) 90 tablet 11  . Black Pepper-Turmeric (TURMERIC COMPLEX/BLACK PEPPER PO) Take 900 mg by mouth daily.     . Cholecalciferol (VITAMIN D PO) Take 15,000 Units by mouth daily.     . diphenhydrAMINE (BENADRYL) 25 MG tablet Take 25 mg by mouth at bedtime.    . docusate sodium (COLACE) 100 MG capsule Take 100 mg by mouth 2 (two) times daily. 1 tablet in the morning and 1 tablet at bedtime    . doxazosin (CARDURA) 4 MG tablet Take 1 tablet (4 mg total) by mouth daily. 90 tablet 4  . finasteride (PROSCAR) 5 MG tablet Take 1 tablet (5 mg total) by mouth daily.  90 tablet 3  . fluticasone (FLONASE) 50 MCG/ACT nasal spray Place 2 sprays into both nostrils at bedtime. PRN     . furosemide (LASIX) 40 MG tablet Take 1 tablet (40 mg total) by mouth daily. 90 tablet 1  . HYDROcodone-acetaminophen (NORCO/VICODIN) 5-325 MG tablet Take 1-2 tablets by mouth every 6 (six) hours as needed for moderate pain. 24 tablet 0  . levothyroxine (SYNTHROID, LEVOTHROID) 200 MCG tablet Take 1 tablet (200 mcg total) by mouth daily before breakfast. 90 tablet 3  . losartan (COZAAR) 50 MG tablet Take 1 tablet (50 mg total) by mouth daily. 90 tablet 1  .  metoprolol succinate (TOPROL-XL) 50 MG 24 hr tablet Take 1 tablet (50 mg total) by mouth daily. Take with or immediately following a meal. 90 tablet 4  . omeprazole (PRILOSEC) 20 MG capsule Take 1 capsule (20 mg total) by mouth 2 (two) times daily. 180 capsule 1  . OVER THE COUNTER MEDICATION daily. Vitamin A to Z 50 plus    . polyethylene glycol (MIRALAX / GLYCOLAX) packet Take 17 g by mouth daily.    . silver sulfADIAZINE (SILVADENE) 1 % cream Apply 1 application topically daily. 50 g 2  . spironolactone (ALDACTONE) 25 MG tablet Take 1 tablet (25 mg total) by mouth daily. 90 tablet 1  . warfarin (COUMADIN) 5 MG tablet Takes 7.5 mg daily or as directed. (Patient taking differently: Takes 7.5 mg daily for 5 days and 10mg  for 2 days (Friday, Monday) or as directed. Get checked every 3-4 weeks for INR) 145 tablet 1   No current facility-administered medications for this visit.     Allergies as of 05/26/2016 - Review Complete 05/26/2016  Allergen Reaction Noted  . Other Swelling 01/18/2016    Vitals: BP (!) 148/64 (BP Location: Left Arm, Patient Position: Sitting, Cuff Size: Large)   Pulse 61   Ht 5\' 9"  (1.753 m)   Wt 267 lb (121.1 kg)   BMI 39.43 kg/m  Last Weight:  Wt Readings from Last 1 Encounters:  05/26/16 267 lb (121.1 kg)   Last Height:   Ht Readings from Last 1 Encounters:  05/26/16 5\' 9"  (1.753 m)      Physical exam: Exam: Gen: NAD, conversant, well nourised, obese, well groomed                     CV: RRR, +SEM. No Carotid Bruits. No peripheral edema, warm, nontender Eyes: Conjunctivae clear without exudates or hemorrhage  Neuro: Detailed Neurologic Exam  Speech:    Speech is normal; fluent and spontaneous with normal comprehension.  Cognition:    The patient is oriented to person, place, and time;     recent and remote memory intact;     language fluent;     normal attention, concentration,     fund of knowledge Cranial Nerves:    The pupils are equal, round, and reactive to light. Attempted funduscopic exam could not visualize due to small pupils.. Visual fields are full to finger confrontation. Extraocular movements are intact. Trigeminal sensation is intact and the muscles of mastication are normal. The face is symmetric. The palate elevates in the midline. Hearing intact. Voice is normal. Shoulder shrug is normal. The tongue has normal motion without fasciculations.   Coordination:    No dysmetira  Gait:    Not ataxic  Motor Observation:    No asymmetry, no atrophy, and no involuntary movements noted. Tone:    Normal muscle tone.    Posture:    Slightly stooped    Strength: Left opponens weakness otherwise strength is V/V in the upper and lower limbs.      Sensation: intact to LT     Reflex Exam:  DTR's:    Deep tendon reflexes in the upper and lower extremities are normal bilaterally.   Toes:    The toes are downgoing bilaterally.   Clonus:    Clonus is absent.     Assessment/Plan:  This is a very lovely 76 year old male here for bilateral hand numbness and weakness, worsening, left greater than right. Needle EMG nerve conduction study of the bilateral upper  extremities to evaluate for carpal tunnel syndrome versus radiculopathy.  Physical therapy: musculoskeletal neck pain, left hand weakness Emg/ncs bilateral uppers to evaluate for carpal tunnel  syndrome versus radiculopathy this week Patient cannot have MRI of the neck due to AICD, we'll hold off on imaging at this time pending results of the EMG nerve conduction study.  Cc: Alesia Richards, MD  Sarina Ill, MD  [facility] [address] [place], [place] [address]  Phone [phone] Fax [phone]

## 2016-05-27 ENCOUNTER — Telehealth: Payer: Self-pay | Admitting: *Deleted

## 2016-05-27 NOTE — Telephone Encounter (Signed)
Called and spoke to pt and scheduled earlier EMG/NCS appt for 05/28/16 per AA,MD request. Advised pt to check in at 315pm for 345pm appt. I explained procedure to pt and advised him to shower prior to test and make sure to avoid using creams, lotions and oils on extremities. He verbalized understanding.

## 2016-05-27 NOTE — Telephone Encounter (Signed)
He will have to wait until Fenruary then, give him back his original appointment, Offer this to one of the other patients then thanks

## 2016-05-27 NOTE — Telephone Encounter (Signed)
Gave to Micanopy B at checkout to contact pt to r/s

## 2016-05-27 NOTE — Telephone Encounter (Signed)
FYI- Patient has canceled the Nerve conduction study/EMG for tomorrow.  Patient stated he is waiting to hear back from his Cardiologist and will call back to reschedule. FYI!!

## 2016-05-28 ENCOUNTER — Encounter: Payer: Self-pay | Admitting: Neurology

## 2016-05-28 DIAGNOSIS — M542 Cervicalgia: Secondary | ICD-10-CM | POA: Diagnosis not present

## 2016-05-29 ENCOUNTER — Encounter: Payer: Self-pay | Admitting: *Deleted

## 2016-05-29 NOTE — Progress Notes (Signed)
Faxed signed orders back to [facility] for POC dated 05/28/16. Fax: [phone]. Received confirmation.

## 2016-06-02 DIAGNOSIS — M542 Cervicalgia: Secondary | ICD-10-CM | POA: Diagnosis not present

## 2016-06-09 DIAGNOSIS — M542 Cervicalgia: Secondary | ICD-10-CM | POA: Diagnosis not present

## 2016-06-11 DIAGNOSIS — M542 Cervicalgia: Secondary | ICD-10-CM | POA: Diagnosis not present

## 2016-06-16 DIAGNOSIS — M542 Cervicalgia: Secondary | ICD-10-CM | POA: Diagnosis not present

## 2016-06-19 DIAGNOSIS — Z95810 Presence of automatic (implantable) cardiac defibrillator: Secondary | ICD-10-CM | POA: Diagnosis not present

## 2016-06-19 DIAGNOSIS — I4891 Unspecified atrial fibrillation: Secondary | ICD-10-CM | POA: Diagnosis not present

## 2016-06-19 DIAGNOSIS — Z7901 Long term (current) use of anticoagulants: Secondary | ICD-10-CM | POA: Diagnosis not present

## 2016-06-19 DIAGNOSIS — I255 Ischemic cardiomyopathy: Secondary | ICD-10-CM | POA: Diagnosis not present

## 2016-06-23 DIAGNOSIS — M542 Cervicalgia: Secondary | ICD-10-CM | POA: Diagnosis not present

## 2016-06-26 DIAGNOSIS — M542 Cervicalgia: Secondary | ICD-10-CM | POA: Diagnosis not present

## 2016-07-04 DIAGNOSIS — M542 Cervicalgia: Secondary | ICD-10-CM | POA: Diagnosis not present

## 2016-07-10 ENCOUNTER — Ambulatory Visit (INDEPENDENT_AMBULATORY_CARE_PROVIDER_SITE_OTHER): Payer: Medicare Other | Admitting: Neurology

## 2016-07-10 ENCOUNTER — Encounter: Payer: Medicare Other | Admitting: Neurology

## 2016-07-10 ENCOUNTER — Ambulatory Visit (INDEPENDENT_AMBULATORY_CARE_PROVIDER_SITE_OTHER): Payer: Self-pay | Admitting: Neurology

## 2016-07-10 DIAGNOSIS — Z0289 Encounter for other administrative examinations: Secondary | ICD-10-CM

## 2016-07-10 DIAGNOSIS — M5412 Radiculopathy, cervical region: Secondary | ICD-10-CM

## 2016-07-10 DIAGNOSIS — G5603 Carpal tunnel syndrome, bilateral upper limbs: Secondary | ICD-10-CM | POA: Diagnosis not present

## 2016-07-10 DIAGNOSIS — R29898 Other symptoms and signs involving the musculoskeletal system: Secondary | ICD-10-CM

## 2016-07-10 DIAGNOSIS — M542 Cervicalgia: Secondary | ICD-10-CM

## 2016-07-10 NOTE — Progress Notes (Signed)
See procedure note.

## 2016-07-12 NOTE — Progress Notes (Addendum)
Full Name: [name] [name] Gender: Male MRN #: [id number] Date of Birth: 02/23/1941    Visit Date: 07/10/2016 11:45 Age: 76 Years 12 Months Old Examining Physician: Sarina Ill, MD  Referring Physician: Jaynee Eagles     Summary: Nerve Conduction Studies were performed on the bilateral upper extremities. The right median APB motor nerve showed prolonged distal onset latency (7.7 ms, N<4.4) and reduced amplitude(64mv, N>4). The right Median 2nd Digit sensory nerve showed no response. The left median APB motor nerve showed prolonged distal onset latency (8 ms, N<4.4) and reduced amplitude(1.83mv, N>4).. The left Median 2nd Digit sensory nerve showed no response All remaining nerves (as indicated in the following tables) were within normal limits.    Conclusion: There is left severe and right moderately severe Carpal Tunnel Syndrome  Sarina Ill, M.D.  [facility] [place], [place] [address] Tel: [phone] Fax: [phone]     [facility]    Nerve / Sites Rec. Site Peak Lat Ref. Amp.1-2 Ref. Distance    ms ms V V cm  R Median - Orthodromic (Dig II, Mid palm)     Dig II Wrist NR ?3.40 NR ?10.0 13  L Median - Orthodromic (Dig II, Mid palm)     Dig II Wrist NR ?3.40 NR ?10.0 13  R Ulnar - Orthodromic, (Dig V, Mid palm)     Dig V Wrist 2.86 ?3.10 4.6 ?5.0 11  L Ulnar - Orthodromic, (Dig V, Mid palm)     Dig V Wrist NR ?3.10 NR ?5.0 11     MNC    Nerve / Sites Muscle Latency Ref. Amplitude Ref. Rel Amp Segments Distance Lat Diff Velocity Ref. Area    ms ms mV mV %  cm ms m/s m/s mVms  R Median - APB     Wrist APB 7.7 ?4.4 1.0 ?4.0 100 Wrist - APB 7    5.2     Upper arm APB 14.3  0.7  66.7 Upper arm - Wrist 27 6.7 41  2.5  L Median - APB     Wrist APB 8.0 ?4.4 1.1 ?4.0 100 Wrist - APB 7    2.5     Upper arm APB 14.0  0.9  89.8 Upper arm - Wrist 26 6.0 43  3.5  R Ulnar - ADM     Wrist ADM 3.0 ?3.3 7.0 ?6.0 100 Wrist - ADM 7    29.6     B.Elbow ADM  7.2  5.7  81.5 B.Elbow - Wrist 22 4.2 53 ?49 24.8     A.Elbow ADM 9.4  6.6  115 A.Elbow - B.Elbow 12 2.2 55 ?49 27.4         A.Elbow - Wrist  6.4     L Ulnar - ADM     Wrist ADM 3.2 ?3.3 5.5 ?6.0 100 Wrist - ADM 7    24.5     B.Elbow ADM 7.6  5.2  94.9 B.Elbow - Wrist 22 4.4 50 ?49 23.0     A.Elbow ADM 9.8  5.2  100 A.Elbow - B.Elbow 11 2.2 49 ?49 22.7         A.Elbow - Wrist  6.7        F  Wave    Nerve F Lat Ref.   ms ms  R Ulnar - ADM 30.7 ?32.0  L Ulnar - ADM 30.6 ?32.0     EMG full  Side Muscle Nerve Root Ins Act Fibs Psw Amp Dur Poly Recrt Fascics Other  Left Deltoid Axillary C5-6 Nml Nml Nml Nml Nml Nml Nml Nml Nml  Left Triceps Radial C6-7-8 Nml Nml Nml Nml Nml Nml Nml Nml Nml  Left PronatorTeres Median C6-7 Nml Nml Nml Nml Nml Nml Nml Nml Nml  Left 1stDorInt Ulnar C8-T1 Nml Nml Nml Nml Nml Nml Nml Nml Nml  Left Opp Pollicis Median 0000000 Nml Nml 1+ Nml Nml Nml Nml Nml Nml  Left C7 Parasp Rami C7 Nml Nml Nml Nml Nml Nml Nml Nml Nml  Left C6 Parasp Rami C6 Nml Nml Nml Nml Nml Nml Nml Nml Nml      Side Muscle Nerve Root Ins Act Fibs Psw Amp Dur Poly Recrt Fascics Other  Right Deltoid Axillary C5-6 Nml Nml Nml Nml Nml Nml Nml Nml Nml  Right Triceps Radial C6-7-8 Nml Nml Nml Nml Nml Nml Nml Nml Nml  Right PronatorTeres Median C6-7 Nml Nml Nml Nml Nml Nml Nml Nml Nml  Right 1stDorInt Ulnar C8-T1 Nml Nml Nml Nml Nml Nml Nml Nml Nml  Right Opp Pollicis Median 0000000 Nml Nml Nml +1 Nml Nml Nml Nml Nml  Right C7 Parasp Rami C7 Nml Nml Nml Nml Nml Nml Nml Nml Nml  Right C6 Parasp Rami C6 Nml Nml Nml Nml Nml Nml Nml Nml Nml

## 2016-07-14 NOTE — Addendum Note (Signed)
Addended by: Sarina Ill B on: 07/14/2016 08:48 PM   Modules accepted: Orders

## 2016-07-14 NOTE — Procedures (Signed)
Full Name: Adhrit Caccavale Gender: Male MRN #: [id number] Date of Birth: Jan 11, 1941    Visit Date: 07/10/2016 11:45 Age: 76 Years 4 Months Old Examining Physician: Sarina Ill, MD  Referring Physician: Jaynee Eagles     Summary: Nerve Conduction Studies were performed on the bilateral upper extremities. The right median APB motor nerve showed prolonged distal onset latency (7.7 ms, N<4.4) and reduced amplitude(59mv, N>4). The right Median 2nd Digit sensory nerve showed no response. The left median APB motor nerve showed prolonged distal onset latency (8 ms, N<4.4) and reduced amplitude(1.46mv, N>4).. The left Median 2nd Digit sensory nerve showed no response All remaining nerves (as indicated in the following tables) were within normal limits.    Conclusion: There is left severe and right moderately severe Carpal Tunnel Syndrome  Sarina Ill, M.D.  [facility] [place], [place] [address] Tel: [phone] Fax: [phone]     [facility]    Nerve / Sites Rec. Site Peak Lat Ref. Amp.1-2 Ref. Distance    ms ms V V cm  R Median - Orthodromic (Dig II, Mid palm)     Dig II Wrist NR ?3.40 NR ?10.0 13  L Median - Orthodromic (Dig II, Mid palm)     Dig II Wrist NR ?3.40 NR ?10.0 13  R Ulnar - Orthodromic, (Dig V, Mid palm)     Dig V Wrist 2.86 ?3.10 4.6 ?5.0 11  L Ulnar - Orthodromic, (Dig V, Mid palm)     Dig V Wrist NR ?3.10 NR ?5.0 11     MNC    Nerve / Sites Muscle Latency Ref. Amplitude Ref. Rel Amp Segments Distance Lat Diff Velocity Ref. Area    ms ms mV mV %  cm ms m/s m/s mVms  R Median - APB     Wrist APB 7.7 ?4.4 1.0 ?4.0 100 Wrist - APB 7    5.2     Upper arm APB 14.3  0.7  66.7 Upper arm - Wrist 27 6.7 41  2.5  L Median - APB     Wrist APB 8.0 ?4.4 1.1 ?4.0 100 Wrist - APB 7    2.5     Upper arm APB 14.0  0.9  89.8 Upper arm - Wrist 26 6.0 43  3.5  R Ulnar - ADM     Wrist ADM 3.0 ?3.3 7.0 ?6.0 100 Wrist - ADM 7    29.6     B.Elbow ADM 7.2  5.7   81.5 B.Elbow - Wrist 22 4.2 53 ?49 24.8     A.Elbow ADM 9.4  6.6  115 A.Elbow - B.Elbow 12 2.2 55 ?49 27.4         A.Elbow - Wrist  6.4     L Ulnar - ADM     Wrist ADM 3.2 ?3.3 5.5 ?6.0 100 Wrist - ADM 7    24.5     B.Elbow ADM 7.6  5.2  94.9 B.Elbow - Wrist 22 4.4 50 ?49 23.0     A.Elbow ADM 9.8  5.2  100 A.Elbow - B.Elbow 11 2.2 49 ?49 22.7         A.Elbow - Wrist  6.7        F  Wave    Nerve F Lat Ref.   ms ms  R Ulnar - ADM 30.7 ?32.0  L Ulnar - ADM 30.6 ?32.0     EMG full  Side Muscle Nerve Root Ins Act Fibs Psw Amp Dur Poly Recrt Fascics Other  Left Deltoid Axillary C5-6 Nml Nml Nml Nml Nml Nml Nml Nml Nml  Left Triceps Radial C6-7-8 Nml Nml Nml Nml Nml Nml Nml Nml Nml  Left PronatorTeres Median C6-7 Nml Nml Nml Nml Nml Nml Nml Nml Nml  Left 1stDorInt Ulnar C8-T1 Nml Nml Nml Nml Nml Nml Nml Nml Nml  Left Opp Pollicis Median 0000000 Nml Nml 1+ Nml Nml Nml Nml Nml Nml  Left C7 Parasp Rami C7 Nml Nml Nml Nml Nml Nml Nml Nml Nml  Left C6 Parasp Rami C6 Nml Nml Nml Nml Nml Nml Nml Nml Nml      Side Muscle Nerve Root Ins Act Fibs Psw Amp Dur Poly Recrt Fascics Other  Right Deltoid Axillary C5-6 Nml Nml Nml Nml Nml Nml Nml Nml Nml  Right Triceps Radial C6-7-8 Nml Nml Nml Nml Nml Nml Nml Nml Nml  Right PronatorTeres Median C6-7 Nml Nml Nml Nml Nml Nml Nml Nml Nml  Right 1stDorInt Ulnar C8-T1 Nml Nml Nml Nml Nml Nml Nml Nml Nml  Right Opp Pollicis Median 0000000 Nml Nml Nml +1 Nml Nml Nml Nml Nml  Right C7 Parasp Rami C7 Nml Nml Nml Nml Nml Nml Nml Nml Nml  Right C6 Parasp Rami C6 Nml Nml Nml Nml Nml Nml Nml Nml Nml

## 2016-07-23 DIAGNOSIS — M25532 Pain in left wrist: Secondary | ICD-10-CM | POA: Diagnosis not present

## 2016-07-23 DIAGNOSIS — M25531 Pain in right wrist: Secondary | ICD-10-CM | POA: Diagnosis not present

## 2016-07-24 ENCOUNTER — Other Ambulatory Visit: Payer: Self-pay | Admitting: *Deleted

## 2016-07-24 ENCOUNTER — Ambulatory Visit (INDEPENDENT_AMBULATORY_CARE_PROVIDER_SITE_OTHER): Payer: Medicare Other | Admitting: Internal Medicine

## 2016-07-24 ENCOUNTER — Encounter: Payer: Self-pay | Admitting: Internal Medicine

## 2016-07-24 VITALS — BP 152/70 | HR 64 | Temp 97.0°F | Resp 16 | Ht 69.0 in | Wt 264.0 lb

## 2016-07-24 DIAGNOSIS — R7303 Prediabetes: Secondary | ICD-10-CM

## 2016-07-24 DIAGNOSIS — R7309 Other abnormal glucose: Secondary | ICD-10-CM

## 2016-07-24 DIAGNOSIS — E6609 Other obesity due to excess calories: Secondary | ICD-10-CM | POA: Diagnosis not present

## 2016-07-24 DIAGNOSIS — M109 Gout, unspecified: Secondary | ICD-10-CM

## 2016-07-24 DIAGNOSIS — E559 Vitamin D deficiency, unspecified: Secondary | ICD-10-CM

## 2016-07-24 DIAGNOSIS — IMO0001 Reserved for inherently not codable concepts without codable children: Secondary | ICD-10-CM

## 2016-07-24 DIAGNOSIS — Z6839 Body mass index (BMI) 39.0-39.9, adult: Secondary | ICD-10-CM

## 2016-07-24 DIAGNOSIS — E782 Mixed hyperlipidemia: Secondary | ICD-10-CM

## 2016-07-24 DIAGNOSIS — Z79899 Other long term (current) drug therapy: Secondary | ICD-10-CM | POA: Diagnosis not present

## 2016-07-24 DIAGNOSIS — I4891 Unspecified atrial fibrillation: Secondary | ICD-10-CM

## 2016-07-24 DIAGNOSIS — I2510 Atherosclerotic heart disease of native coronary artery without angina pectoris: Secondary | ICD-10-CM | POA: Diagnosis not present

## 2016-07-24 DIAGNOSIS — I10 Essential (primary) hypertension: Secondary | ICD-10-CM

## 2016-07-24 LAB — BASIC METABOLIC PANEL WITH GFR
BUN: 18 mg/dL (ref 7–25)
CALCIUM: 9.4 mg/dL (ref 8.6–10.3)
CO2: 26 mmol/L (ref 20–31)
CREATININE: 1.2 mg/dL — AB (ref 0.70–1.18)
Chloride: 100 mmol/L (ref 98–110)
GFR, Est African American: 68 mL/min (ref >=60)
GFR, Est Non African American: 59 mL/min — ABNORMAL LOW (ref >=60)
Glucose, Bld: 139 mg/dL — ABNORMAL HIGH (ref 65–99)
Potassium: 4.4 mmol/L (ref 3.5–5.3)
SODIUM: 137 mmol/L (ref 135–146)

## 2016-07-24 LAB — TSH: TSH: 4.06 m[IU]/L (ref 0.40–4.50)

## 2016-07-24 LAB — HEMOGLOBIN A1C
HEMOGLOBIN A1C: 5.6 % (ref <5.7)
Mean Plasma Glucose: 114 mg/dL

## 2016-07-24 LAB — CBC WITH DIFFERENTIAL/PLATELET
Basophils Absolute: 97 cells/uL (ref 0–200)
Basophils Relative: 1 %
EOS ABS: 194 {cells}/uL (ref 15–500)
EOS PCT: 2 %
HCT: 41.3 % (ref 38.5–50.0)
HEMOGLOBIN: 13.3 g/dL (ref 13.2–17.1)
LYMPHS ABS: 1552 {cells}/uL (ref 850–3900)
Lymphocytes Relative: 16 %
MCH: 27.9 pg (ref 27.0–33.0)
MCHC: 32.2 g/dL (ref 32.0–36.0)
MCV: 86.8 fL (ref 80.0–100.0)
MONOS PCT: 3 %
MPV: 10.3 fL (ref 7.5–12.5)
Monocytes Absolute: 291 cells/uL (ref 200–950)
Neutro Abs: 7566 cells/uL (ref 1500–7800)
Neutrophils Relative %: 78 %
Platelets: 460 10*3/uL — ABNORMAL HIGH (ref 140–400)
RBC: 4.76 MIL/uL (ref 4.20–5.80)
RDW: 20.3 % — ABNORMAL HIGH (ref 11.0–15.0)
WBC: 9.7 10*3/uL (ref 3.8–10.8)

## 2016-07-24 LAB — LIPID PANEL
CHOLESTEROL: 119 mg/dL (ref <200)
HDL: 17 mg/dL — ABNORMAL LOW (ref >40)
LDL Cholesterol: 66 mg/dL (ref <100)
Total CHOL/HDL Ratio: 7 Ratio — ABNORMAL HIGH (ref <5.0)
Triglycerides: 180 mg/dL — ABNORMAL HIGH (ref <150)
VLDL: 36 mg/dL — ABNORMAL HIGH (ref <30)

## 2016-07-24 LAB — URIC ACID: Uric Acid, Serum: 5.7 mg/dL (ref 4.0–8.0)

## 2016-07-24 LAB — HEPATIC FUNCTION PANEL
ALT: 27 U/L (ref 9–46)
AST: 27 U/L (ref 10–35)
Albumin: 4.4 g/dL (ref 3.6–5.1)
Alkaline Phosphatase: 50 U/L (ref 40–115)
BILIRUBIN DIRECT: 0.2 mg/dL (ref <=0.2)
Indirect Bilirubin: 0.6 mg/dL (ref 0.2–1.2)
TOTAL PROTEIN: 6.6 g/dL (ref 6.1–8.1)
Total Bilirubin: 0.8 mg/dL (ref 0.2–1.2)

## 2016-07-24 LAB — MAGNESIUM: MAGNESIUM: 1.7 mg/dL (ref 1.5–2.5)

## 2016-07-24 MED ORDER — OMEPRAZOLE 20 MG PO CPDR: 20.0000 mg | DELAYED_RELEASE_CAPSULE | Freq: Two times a day (BID) | ORAL | 3 refills | Status: DC

## 2016-07-24 MED ORDER — METOPROLOL SUCCINATE ER 50 MG PO TB24: 50.0000 mg | ORAL_TABLET | Freq: Every day | ORAL | 3 refills | Status: DC

## 2016-07-24 MED ORDER — LOSARTAN POTASSIUM 50 MG PO TABS: 50.0000 mg | ORAL_TABLET | Freq: Every day | ORAL | 3 refills | Status: DC

## 2016-07-24 NOTE — Progress Notes (Signed)
[facility] [name], M.D.        [name] [name]. [name], P.A.-C       Starlyn Skeans, P.A.-C  [facility]                [address] [place] [address] Telephone [phone] Telefax [phone] ______________________________________________________________________     This very nice 76 y.o. MWM presents for 6 month follow up with Hypertension, ASCAD/CABG,Afib, Hyperlipidemia, Pre-Diabetes and Vitamin D Deficiency. He also has gout asymptomatic on meds.      Patient is treated for HTN (age 69 in 97) & BP has been controlled at home. Today's BP is elevated at 152/70. In July 2015, he underwent CABG x 3V and had po pAfib failing CV x 2 and has since been on Coumadin followed at his cardiologist's office. In March 2016 he had a AICD/PPM implanted. Patient has had no complaints of any cardiac type chest pain, palpitations, dyspnea/orthopnea/PND, dizziness, claudication, or dependent edema.     Hyperlipidemia is controlled with diet & meds. Patient denies myalgias or other med SE's. Last Lipids were at goal albeit elevated Trig's: Lab Results  Component Value Date   CHOL 143 04/16/2016   HDL 20 (L) 04/16/2016   LDLCALC 86 04/16/2016   TRIG 183 (H) 04/16/2016   CHOLHDL 7.2 (H) 04/16/2016      Patient is monitored on Thyroid replacement. Also, the patient has history of PreDiabetes  (A1c 6.0% in 2015) and has had no symptoms of reactive hypoglycemia, diabetic polys, paresthesias or visual blurring.  Last A1c was at goal:  Lab Results  Component Value Date   HGBA1C 5.5 04/16/2016      Further, the patient also has history of Vitamin D Deficiency("39" in Aug 2016)  and supplements vitamin D without any suspected side-effects. Last vitamin D was at goal: Lab Results  Component Value Date   VD25OH 71 01/03/2016   Current Outpatient Prescriptions on File Prior to Visit  Medication Sig  .  acetaminophen (TYLENOL) 500 MG tablet Take 500 mg by mouth every 6 (six) hours as needed for mild pain.   Marland Kitchen albuterol (PROVENTIL HFA;VENTOLIN HFA) 108 (90 BASE) MCG/ACT inhaler Inhale 2 puffs into the lungs every 6 (six) hours as needed for wheezing or shortness of breath.  . allopurinol (ZYLOPRIM) 300 MG tablet Take 1 tablet (300 mg total) by mouth daily.  Marland Kitchen aspirin EC 81 MG tablet Take 81 mg by mouth daily.  Marland Kitchen atorvastatin (LIPITOR) 80 MG tablet Take 1/2 to 1 tablet daily or as directed for Cholesterol (Patient taking differently: Take 40 mg by mouth daily at 6 PM. )  . Black Pepper-Turmeric (TURMERIC COMPLEX/BLACK PEPPER PO) Take 900 mg by mouth daily.   . Cholecalciferol (VITAMIN D PO) Take 15,000 Units by mouth daily.   . diphenhydrAMINE (BENADRYL) 25 MG tablet Take 25 mg by mouth at bedtime.  . docusate sodium (COLACE) 100 MG capsule Take 100 mg by mouth 2 (two) times daily. 1 tablet in the morning and 1 tablet at bedtime  . doxazosin (CARDURA) 4 MG tablet Take 1 tablet (4 mg total) by mouth daily.  . finasteride (PROSCAR) 5 MG tablet Take 1 tablet (5 mg total) by mouth daily.  . fluticasone (FLONASE) 50 MCG/ACT nasal spray Place 2 sprays into both nostrils at bedtime. PRN   . furosemide (LASIX) 40 MG tablet Take  1 tablet (40 mg total) by mouth daily.  Marland Kitchen HYDROcodone-acetaminophen (NORCO/VICODIN) 5-325 MG tablet Take 1-2 tablets by mouth every 6 (six) hours as needed for moderate pain.  Marland Kitchen levothyroxine (SYNTHROID, LEVOTHROID) 200 MCG tablet Take 1 tablet (200 mcg total) by mouth daily before breakfast.  . OVER THE COUNTER MEDICATION daily. Vitamin A to Z 50 plus  . polyethylene glycol (MIRALAX / GLYCOLAX) packet Take 17 g by mouth daily.  . silver sulfADIAZINE (SILVADENE) 1 % cream Apply 1 application topically daily.  Marland Kitchen spironolactone (ALDACTONE) 25 MG tablet Take 1 tablet (25 mg total) by mouth daily.  Marland Kitchen warfarin (COUMADIN) 5 MG tablet Takes 7.5 mg daily or as directed. (Patient taking  differently: Takes 7.5 mg daily for 5 days and 10mg  for 2 days (Friday, Monday) or as directed. Get checked every 3-4 weeks for INR)   No current facility-administered medications on file prior to visit.    Allergies  Allergen Reactions  . Other Swelling    SODIUM SENSITIVE   PMHx:   Past Medical History:  Diagnosis Date  . A-fib ([place]) 01/2014  . AICD (automatic cardioverter/defibrillator) present    [employer]  . Arthritis   . ASHD (arteriosclerotic heart disease) 2015   s/p CABG  . Cataract    s/p left  . COPD (chronic obstructive pulmonary disease) ([place])    by CXR, pt unsure of this  . GERD (gastroesophageal reflux disease)   . Hyperlipidemia   . Hypertension   . Hypothyroidism   . LBBB (left bundle branch block)   . Pre-diabetes   . Prediabetes 01/2014  . Presence of permanent cardiac pacemaker   . Sleep apnea    no CPAP  . Thyroid disease    hypothyroid   Immunization History  Administered Date(s) Administered  . Influenza, High Dose Seasonal PF 04/23/2015  . Influenza,inj,Quad PF,36+ Mos 01/31/2016  . Pneumococcal Conjugate-13 11/21/2014   Past Surgical History:  Procedure Laterality Date  . CARDIAC DEFIBRILLATOR PLACEMENT  07/2014  . CATARACT EXTRACTION W/ INTRAOCULAR LENS IMPLANT Left   . CORONARY ARTERY BYPASS GRAFT  11/2013  . lumb laminectomy  [phone]   x 3  . LUMBAR FUSION  2013  . NASAL SEPTOPLASTY W/ TURBINOPLASTY Bilateral 01/30/2016   Procedure: NASAL SEPTOPLASTY WITH BILATERAL TURBINATE REDUCTION;  Surgeon: Rozetta Nunnery, MD;  Location: [facility];  Service: ENT;  Laterality: Bilateral;  . UVULECTOMY N/A 01/30/2016   Procedure: UVULECTOMY;  Surgeon: Rozetta Nunnery, MD;  Location: [facility] OR;  Service: ENT;  Laterality: N/A;   FHx:    Reviewed / unchanged  SHx:    Reviewed / unchanged  Systems Review:  Constitutional: Denies fever, chills, wt changes, headaches, insomnia, fatigue, night sweats, change in appetite. Eyes: Denies  redness, blurred vision, diplopia, discharge, itchy, watery eyes.  ENT: Denies discharge, congestion, post nasal drip, epistaxis, sore throat, earache, hearing loss, dental pain, tinnitus, vertigo, sinus pain, snoring.  CV: Denies chest pain, palpitations, irregular heartbeat, syncope, dyspnea, diaphoresis, orthopnea, PND, claudication or edema. Respiratory: denies cough, dyspnea, DOE, pleurisy, hoarseness, laryngitis, wheezing.  Gastrointestinal: Denies dysphagia, odynophagia, heartburn, reflux, water brash, abdominal pain or cramps, nausea, vomiting, bloating, diarrhea, constipation, hematemesis, melena, hematochezia  or hemorrhoids. Genitourinary: Denies dysuria, frequency, urgency, nocturia, hesitancy, discharge, hematuria or flank pain. Musculoskeletal: Denies arthralgias, myalgias, stiffness, jt. swelling, pain, limping or strain/sprain.  Skin: Denies pruritus, rash, hives, warts, acne, eczema or change in skin lesion(s). Neuro: No weakness, tremor, incoordination, spasms, paresthesia or pain. Psychiatric: Denies confusion,  memory loss or sensory loss. Endo: Denies change in weight, skin or hair change.  Heme/Lymph: No excessive bleeding, bruising or enlarged lymph nodes.  Physical Exam  BP ( 152/70   P 64   T 97 F    R 16   Ht 5\' 9"     Wt 264 lb   BMI 38.99   Appears well nourished and in no distress.  Eyes: PERRLA, EOMs, conjunctiva no swelling or erythema. Sinuses: No frontal/maxillary tenderness ENT/Mouth: EAC's clear, TM's nl w/o erythema, bulging. Nares clear w/o erythema, swelling, exudates. Oropharynx clear without erythema or exudates. Oral hygiene is good. Tongue normal, non obstructing. Hearing intact.  Neck: Supple. Thyroid nl. Car 2+/2+ without bruits, nodes or JVD. Chest: Respirations nl with BS clear & equal w/o rales, rhonchi, wheezing or stridor.  Cor: Heart sounds normal w/ regular rate and rhythm without sig. murmurs, gallops, clicks, or rubs. Peripheral pulses  normal and equal  without edema.  Abdomen: Soft & bowel sounds normal. Non-tender w/o guarding, rebound, hernias, masses, or organomegaly.  Lymphatics: Unremarkable.  Musculoskeletal: Full ROM all peripheral extremities, joint stability, 5/5 strength, and normal gait.  Skin: Warm, dry without exposed rashes, lesions or ecchymosis apparent.  Neuro: Cranial nerves intact, reflexes equal bilaterally. Sensory-motor testing grossly intact. Tendon reflexes grossly intact.  Pysch: Alert & oriented x 3.  Insight and judgement nl & appropriate. No ideations.  Assessment and Plan:  1. Essential hypertension  - Continue medication, monitor blood pressure at home.  - Continue DASH diet. Reminder to go to the ER if any CP,  SOB, nausea, dizziness, severe HA, changes vision/speech,  left arm numbness and tingling and jaw pain. - given Rx TNG to have available.  - CBC with Differential/Platelet - BASIC METABOLIC PANEL WITH GFR - Magnesium - TSH  2. Mixed hyperlipidemia  - Continue diet/meds, exercise,& lifestyle modifications.  - Continue monitor periodic cholesterol/liver & renal functions  - Hepatic function panel - Lipid panel - TSH  3. Prediabetes  - Continue diet, exercise, lifestyle modifications.  - Monitor appropriate labs. - Hemoglobin A1c - Insulin, random  4. Vitamin D deficiency  - Continue supplementation. - VITAMIN D 25 Hydroxy   5. Abnormal glucose  - Hemoglobin A1c  6. ASHD/CABG x 3V (11/2013)   7. Atrial fibrillation ([place])   8. Gout  - Uric acid  9. Medication management  - CBC with Differential/Platelet - BASIC METABOLIC PANEL WITH GFR - Hepatic function panel - Magnesium - Lipid panel - TSH - Hemoglobin A1c - Insulin, random - VITAMIN D 25 Hydroxy  - Uric acid       Recommended regular exercise, BP monitoring, weight control, and discussed med and SE's. Recommended labs to assess and monitor clinical status. Further disposition pending results  of labs. Over 30 minutes of exam, counseling, chart review was performed

## 2016-07-24 NOTE — Patient Instructions (Signed)

## 2016-07-25 ENCOUNTER — Telehealth: Payer: Self-pay | Admitting: *Deleted

## 2016-07-25 DIAGNOSIS — H25011 Cortical age-related cataract, right eye: Secondary | ICD-10-CM | POA: Diagnosis not present

## 2016-07-25 DIAGNOSIS — Z961 Presence of intraocular lens: Secondary | ICD-10-CM | POA: Diagnosis not present

## 2016-07-25 DIAGNOSIS — H25041 Posterior subcapsular polar age-related cataract, right eye: Secondary | ICD-10-CM | POA: Diagnosis not present

## 2016-07-25 DIAGNOSIS — H2511 Age-related nuclear cataract, right eye: Secondary | ICD-10-CM | POA: Diagnosis not present

## 2016-07-25 DIAGNOSIS — H527 Unspecified disorder of refraction: Secondary | ICD-10-CM | POA: Diagnosis not present

## 2016-07-25 LAB — VITAMIN D 25 HYDROXY (VIT D DEFICIENCY, FRACTURES): VIT D 25 HYDROXY: 77 ng/mL (ref 30–100)

## 2016-07-25 LAB — INSULIN, RANDOM: INSULIN: 83.8 u[IU]/mL — AB (ref 2.0–19.6)

## 2016-07-25 NOTE — Telephone Encounter (Signed)
Patient is aware of his lab results 

## 2016-08-06 DIAGNOSIS — M65342 Trigger finger, left ring finger: Secondary | ICD-10-CM | POA: Insufficient documentation

## 2016-08-06 DIAGNOSIS — G5603 Carpal tunnel syndrome, bilateral upper limbs: Secondary | ICD-10-CM | POA: Insufficient documentation

## 2016-08-13 DIAGNOSIS — Z4502 Encounter for adjustment and management of automatic implantable cardiac defibrillator: Secondary | ICD-10-CM | POA: Diagnosis not present

## 2016-08-25 DIAGNOSIS — I255 Ischemic cardiomyopathy: Secondary | ICD-10-CM | POA: Diagnosis not present

## 2016-08-25 DIAGNOSIS — I4891 Unspecified atrial fibrillation: Secondary | ICD-10-CM | POA: Diagnosis not present

## 2016-08-25 DIAGNOSIS — Z95810 Presence of automatic (implantable) cardiac defibrillator: Secondary | ICD-10-CM | POA: Diagnosis not present

## 2016-08-25 DIAGNOSIS — Z7901 Long term (current) use of anticoagulants: Secondary | ICD-10-CM | POA: Diagnosis not present

## 2016-08-26 ENCOUNTER — Other Ambulatory Visit: Payer: Self-pay

## 2016-08-26 MED ORDER — FUROSEMIDE 40 MG PO TABS: 40.0000 mg | ORAL_TABLET | Freq: Every day | ORAL | 1 refills | Status: DC

## 2016-08-26 MED ORDER — LOSARTAN POTASSIUM 50 MG PO TABS: 50.0000 mg | ORAL_TABLET | Freq: Every day | ORAL | 3 refills | Status: DC

## 2016-08-27 ENCOUNTER — Other Ambulatory Visit: Payer: Self-pay | Admitting: Physician Assistant

## 2016-08-27 MED ORDER — ALBUTEROL SULFATE HFA 108 (90 BASE) MCG/ACT IN AERS: 2.0000 | INHALATION_SPRAY | Freq: Four times a day (QID) | RESPIRATORY_TRACT | 3 refills | Status: DC | PRN

## 2016-09-02 ENCOUNTER — Encounter: Payer: Self-pay | Admitting: Gastroenterology

## 2016-09-03 ENCOUNTER — Telehealth: Payer: Self-pay | Admitting: *Deleted

## 2016-09-03 NOTE — Telephone Encounter (Signed)
[facility] was called about the patient receiving #3 Albuterol Inhalers, when he had only requested #1 inhaler RX request be sent to his local pharmacy.  The representative had a phone # of [phone] for a return department that the patient must called in regard to returns and any refund. The 3 inhalers cost the patient $171.00. The RX has been canceled at [facility] in [facility]. The patient was informed.

## 2016-09-04 ENCOUNTER — Telehealth: Payer: Self-pay | Admitting: *Deleted

## 2016-09-04 MED ORDER — PREDNISONE 20 MG PO TABS: ORAL_TABLET | ORAL | 0 refills | Status: DC

## 2016-09-04 NOTE — Telephone Encounter (Signed)
Patient called and states he has a poison oak rash, that has not been healed with Calamine lotion.  Per Dr Melford Aase, send in an RX for Prednisone.

## 2016-09-24 DIAGNOSIS — H52201 Unspecified astigmatism, right eye: Secondary | ICD-10-CM | POA: Diagnosis not present

## 2016-09-24 DIAGNOSIS — H25811 Combined forms of age-related cataract, right eye: Secondary | ICD-10-CM | POA: Diagnosis not present

## 2016-09-24 DIAGNOSIS — H527 Unspecified disorder of refraction: Secondary | ICD-10-CM | POA: Diagnosis not present

## 2016-09-24 DIAGNOSIS — H26492 Other secondary cataract, left eye: Secondary | ICD-10-CM | POA: Diagnosis not present

## 2016-09-24 DIAGNOSIS — H40003 Preglaucoma, unspecified, bilateral: Secondary | ICD-10-CM | POA: Diagnosis not present

## 2016-09-24 LAB — HM DIABETES EYE EXAM

## 2016-09-29 DIAGNOSIS — M25532 Pain in left wrist: Secondary | ICD-10-CM | POA: Diagnosis not present

## 2016-09-29 DIAGNOSIS — M25531 Pain in right wrist: Secondary | ICD-10-CM | POA: Diagnosis not present

## 2016-09-29 DIAGNOSIS — M542 Cervicalgia: Secondary | ICD-10-CM | POA: Diagnosis not present

## 2016-09-29 DIAGNOSIS — M25521 Pain in right elbow: Secondary | ICD-10-CM | POA: Diagnosis not present

## 2016-10-07 DIAGNOSIS — K219 Gastro-esophageal reflux disease without esophagitis: Secondary | ICD-10-CM | POA: Diagnosis not present

## 2016-10-07 DIAGNOSIS — G4730 Sleep apnea, unspecified: Secondary | ICD-10-CM | POA: Diagnosis not present

## 2016-10-07 DIAGNOSIS — Z955 Presence of coronary angioplasty implant and graft: Secondary | ICD-10-CM | POA: Diagnosis not present

## 2016-10-07 DIAGNOSIS — I10 Essential (primary) hypertension: Secondary | ICD-10-CM | POA: Diagnosis not present

## 2016-10-07 DIAGNOSIS — Z79899 Other long term (current) drug therapy: Secondary | ICD-10-CM | POA: Diagnosis not present

## 2016-10-07 DIAGNOSIS — E669 Obesity, unspecified: Secondary | ICD-10-CM | POA: Diagnosis not present

## 2016-10-07 DIAGNOSIS — G5603 Carpal tunnel syndrome, bilateral upper limbs: Secondary | ICD-10-CM | POA: Diagnosis not present

## 2016-10-07 DIAGNOSIS — I255 Ischemic cardiomyopathy: Secondary | ICD-10-CM | POA: Diagnosis not present

## 2016-10-07 DIAGNOSIS — E785 Hyperlipidemia, unspecified: Secondary | ICD-10-CM | POA: Diagnosis not present

## 2016-10-07 DIAGNOSIS — G5601 Carpal tunnel syndrome, right upper limb: Secondary | ICD-10-CM | POA: Diagnosis not present

## 2016-10-07 DIAGNOSIS — G5602 Carpal tunnel syndrome, left upper limb: Secondary | ICD-10-CM | POA: Diagnosis not present

## 2016-10-07 DIAGNOSIS — E039 Hypothyroidism, unspecified: Secondary | ICD-10-CM | POA: Diagnosis not present

## 2016-10-07 DIAGNOSIS — Z8679 Personal history of other diseases of the circulatory system: Secondary | ICD-10-CM | POA: Diagnosis not present

## 2016-10-07 DIAGNOSIS — Z7901 Long term (current) use of anticoagulants: Secondary | ICD-10-CM | POA: Diagnosis not present

## 2016-10-07 DIAGNOSIS — Z87891 Personal history of nicotine dependence: Secondary | ICD-10-CM | POA: Diagnosis not present

## 2016-10-10 DIAGNOSIS — H25811 Combined forms of age-related cataract, right eye: Secondary | ICD-10-CM | POA: Diagnosis not present

## 2016-10-10 DIAGNOSIS — H52201 Unspecified astigmatism, right eye: Secondary | ICD-10-CM | POA: Diagnosis not present

## 2016-10-10 DIAGNOSIS — I10 Essential (primary) hypertension: Secondary | ICD-10-CM | POA: Diagnosis not present

## 2016-10-16 DIAGNOSIS — Z951 Presence of aortocoronary bypass graft: Secondary | ICD-10-CM | POA: Diagnosis not present

## 2016-10-16 DIAGNOSIS — Z961 Presence of intraocular lens: Secondary | ICD-10-CM | POA: Diagnosis not present

## 2016-10-16 DIAGNOSIS — Z79899 Other long term (current) drug therapy: Secondary | ICD-10-CM | POA: Diagnosis not present

## 2016-10-16 DIAGNOSIS — I255 Ischemic cardiomyopathy: Secondary | ICD-10-CM | POA: Diagnosis not present

## 2016-10-16 DIAGNOSIS — Z9842 Cataract extraction status, left eye: Secondary | ICD-10-CM | POA: Diagnosis not present

## 2016-10-16 DIAGNOSIS — K219 Gastro-esophageal reflux disease without esophagitis: Secondary | ICD-10-CM | POA: Diagnosis not present

## 2016-10-16 DIAGNOSIS — Z7982 Long term (current) use of aspirin: Secondary | ICD-10-CM | POA: Diagnosis not present

## 2016-10-16 DIAGNOSIS — I110 Hypertensive heart disease with heart failure: Secondary | ICD-10-CM | POA: Diagnosis not present

## 2016-10-16 DIAGNOSIS — I2510 Atherosclerotic heart disease of native coronary artery without angina pectoris: Secondary | ICD-10-CM | POA: Diagnosis not present

## 2016-10-16 DIAGNOSIS — H25811 Combined forms of age-related cataract, right eye: Secondary | ICD-10-CM | POA: Diagnosis not present

## 2016-10-16 DIAGNOSIS — H52221 Regular astigmatism, right eye: Secondary | ICD-10-CM | POA: Insufficient documentation

## 2016-10-16 DIAGNOSIS — M1990 Unspecified osteoarthritis, unspecified site: Secondary | ICD-10-CM | POA: Diagnosis not present

## 2016-10-16 DIAGNOSIS — I4891 Unspecified atrial fibrillation: Secondary | ICD-10-CM | POA: Diagnosis not present

## 2016-10-16 DIAGNOSIS — N402 Nodular prostate without lower urinary tract symptoms: Secondary | ICD-10-CM | POA: Diagnosis not present

## 2016-10-16 DIAGNOSIS — E782 Mixed hyperlipidemia: Secondary | ICD-10-CM | POA: Diagnosis not present

## 2016-10-16 DIAGNOSIS — I5022 Chronic systolic (congestive) heart failure: Secondary | ICD-10-CM | POA: Diagnosis not present

## 2016-10-16 DIAGNOSIS — E669 Obesity, unspecified: Secondary | ICD-10-CM | POA: Diagnosis not present

## 2016-10-16 DIAGNOSIS — Z7901 Long term (current) use of anticoagulants: Secondary | ICD-10-CM | POA: Diagnosis not present

## 2016-10-16 DIAGNOSIS — Z950 Presence of cardiac pacemaker: Secondary | ICD-10-CM | POA: Diagnosis not present

## 2016-10-16 DIAGNOSIS — G4733 Obstructive sleep apnea (adult) (pediatric): Secondary | ICD-10-CM | POA: Diagnosis not present

## 2016-10-16 DIAGNOSIS — E039 Hypothyroidism, unspecified: Secondary | ICD-10-CM | POA: Diagnosis not present

## 2016-10-16 DIAGNOSIS — H2511 Age-related nuclear cataract, right eye: Secondary | ICD-10-CM | POA: Diagnosis not present

## 2016-10-17 DIAGNOSIS — H26492 Other secondary cataract, left eye: Secondary | ICD-10-CM | POA: Diagnosis not present

## 2016-10-17 DIAGNOSIS — H40003 Preglaucoma, unspecified, bilateral: Secondary | ICD-10-CM | POA: Diagnosis not present

## 2016-10-17 DIAGNOSIS — H02834 Dermatochalasis of left upper eyelid: Secondary | ICD-10-CM | POA: Diagnosis not present

## 2016-10-17 DIAGNOSIS — Z961 Presence of intraocular lens: Secondary | ICD-10-CM | POA: Diagnosis not present

## 2016-10-17 LAB — HM DIABETES EYE EXAM

## 2016-10-20 ENCOUNTER — Other Ambulatory Visit: Payer: Self-pay | Admitting: *Deleted

## 2016-10-20 MED ORDER — FLUTICASONE PROPIONATE 50 MCG/ACT NA SUSP: 2.0000 | Freq: Every day | NASAL | 2 refills | Status: DC

## 2016-10-20 MED ORDER — ATORVASTATIN CALCIUM 40 MG PO TABS: 40.0000 mg | ORAL_TABLET | Freq: Every day | ORAL | 1 refills | Status: DC

## 2016-10-22 DIAGNOSIS — G5603 Carpal tunnel syndrome, bilateral upper limbs: Secondary | ICD-10-CM | POA: Diagnosis not present

## 2016-10-22 DIAGNOSIS — M25532 Pain in left wrist: Secondary | ICD-10-CM | POA: Diagnosis not present

## 2016-10-22 DIAGNOSIS — M25531 Pain in right wrist: Secondary | ICD-10-CM | POA: Diagnosis not present

## 2016-10-28 ENCOUNTER — Ambulatory Visit (INDEPENDENT_AMBULATORY_CARE_PROVIDER_SITE_OTHER): Payer: Medicare Other | Admitting: Internal Medicine

## 2016-10-28 ENCOUNTER — Encounter: Payer: Self-pay | Admitting: Internal Medicine

## 2016-10-28 ENCOUNTER — Ambulatory Visit: Payer: Self-pay | Admitting: Internal Medicine

## 2016-10-28 VITALS — BP 138/72 | HR 62 | Temp 97.7°F | Resp 16 | Ht 69.0 in | Wt 266.6 lb

## 2016-10-28 DIAGNOSIS — I2510 Atherosclerotic heart disease of native coronary artery without angina pectoris: Secondary | ICD-10-CM | POA: Diagnosis not present

## 2016-10-28 DIAGNOSIS — E559 Vitamin D deficiency, unspecified: Secondary | ICD-10-CM

## 2016-10-28 DIAGNOSIS — M109 Gout, unspecified: Secondary | ICD-10-CM | POA: Diagnosis not present

## 2016-10-28 DIAGNOSIS — I10 Essential (primary) hypertension: Secondary | ICD-10-CM

## 2016-10-28 DIAGNOSIS — I4891 Unspecified atrial fibrillation: Secondary | ICD-10-CM

## 2016-10-28 DIAGNOSIS — R7303 Prediabetes: Secondary | ICD-10-CM

## 2016-10-28 DIAGNOSIS — Z79899 Other long term (current) drug therapy: Secondary | ICD-10-CM

## 2016-10-28 DIAGNOSIS — E782 Mixed hyperlipidemia: Secondary | ICD-10-CM | POA: Diagnosis not present

## 2016-10-28 LAB — BASIC METABOLIC PANEL WITH GFR
BUN: 15 mg/dL (ref 7–25)
CALCIUM: 9.4 mg/dL (ref 8.6–10.3)
CO2: 27 mmol/L (ref 20–31)
Chloride: 100 mmol/L (ref 98–110)
Creat: 0.92 mg/dL (ref 0.70–1.18)
GFR, EST NON AFRICAN AMERICAN: 81 mL/min (ref >=60)
GFR, Est African American: >89 mL/min (ref >=60)
GLUCOSE: 109 mg/dL — AB (ref 65–99)
POTASSIUM: 4.9 mmol/L (ref 3.5–5.3)
SODIUM: 137 mmol/L (ref 135–146)

## 2016-10-28 LAB — HEPATIC FUNCTION PANEL
ALK PHOS: 62 U/L (ref 40–115)
ALT: 27 U/L (ref 9–46)
AST: 29 U/L (ref 10–35)
Albumin: 4.1 g/dL (ref 3.6–5.1)
BILIRUBIN DIRECT: 0.1 mg/dL (ref <=0.2)
BILIRUBIN INDIRECT: 0.6 mg/dL (ref 0.2–1.2)
TOTAL PROTEIN: 6.6 g/dL (ref 6.1–8.1)
Total Bilirubin: 0.7 mg/dL (ref 0.2–1.2)

## 2016-10-28 LAB — LIPID PANEL
CHOL/HDL RATIO: 7.5 ratio — AB (ref <5.0)
CHOLESTEROL: 150 mg/dL (ref <200)
HDL: 20 mg/dL — ABNORMAL LOW (ref >40)
LDL CALC: 97 mg/dL (ref <100)
TRIGLYCERIDES: 165 mg/dL — AB (ref <150)
VLDL: 33 mg/dL — AB (ref <30)

## 2016-10-28 LAB — TSH: TSH: 4.59 mIU/L — ABNORMAL HIGH (ref 0.40–4.50)

## 2016-10-28 NOTE — Progress Notes (Signed)
This very nice 76 y.o. MWM presents for 3 month follow up with Hypertension, ASHD/CABG, OSA/CPAP,  Hyperlipidemia, Pre-Diabetes and Vitamin D Deficiency. Patient recently had Bilat Cannel repairs on Oct 07, 2016 and on 10/16/16 underwent Rt CE/IOL. Patient also has hx/o Gout controlled on meds.     Patient is treated for HTN since age 77 in 16  & BP has been controlled at home. Today's BP is near goal - 138/72. Patient had 3V CABG in July 2015 and then had po pAfib failing CV x 2 and has been on Coumadin since followed by his Cardiologists. He had a AICD/PPM implanted in Mar 2016. Patient is followed Patient has had no complaints of any cardiac type chest pain, palpitations, dyspnea/orthopnea/PND, dizziness, claudication, or dependent edema.     Hyperlipidemia is controlled with diet & meds. Patient denies myalgias or other med SE's. Last Lipids were  Lab Results  Component Value Date   CHOL 119 07/24/2016   HDL 17 (L) 07/24/2016   LDLCALC 66 07/24/2016   TRIG 180 (H) 07/24/2016   CHOLHDL 7.0 (H) 07/24/2016      Also, the patient has history of PreDiabetes (A21c 6.0% in 2015) and has had no symptoms of reactive hypoglycemia, diabetic polys, paresthesias or visual blurring.  Last A1c was at goal: Lab Results  Component Value Date   HGBA1C 5.6 07/24/2016      Further, the patient also has history of Vitamin D Deficiency ("39" in 2016) and supplements vitamin D without any suspected side-effects. Last vitamin D was at goal:  Lab Results  Component Value Date   VD25OH 77 07/24/2016   Current Outpatient Prescriptions on File Prior to Visit  Medication Sig  . acetaminophen (TYLENOL) 500 MG tablet Take 500 mg by mouth every 6 (six) hours as needed for mild pain.   Marland Kitchen albuterol (PROVENTIL HFA;VENTOLIN HFA) 108 (90 Base) MCG/ACT inhaler Inhale 2 puffs into the lungs every 6 (six) hours as needed for wheezing or shortness of breath.  . allopurinol (ZYLOPRIM) 300 MG tablet Take 1 tablet (300  mg total) by mouth daily.  Marland Kitchen aspirin EC 81 MG tablet Take 81 mg by mouth daily.  Marland Kitchen atorvastatin (LIPITOR) 40 MG tablet Take 1 tablet (40 mg total) by mouth daily.  . Black Pepper-Turmeric (TURMERIC COMPLEX/BLACK PEPPER PO) Take 900 mg by mouth daily.   . Cholecalciferol (VITAMIN D PO) Take 15,000 Units by mouth daily.   . diphenhydrAMINE (BENADRYL) 25 MG tablet Take 25 mg by mouth at bedtime.  . docusate sodium (COLACE) 100 MG capsule Take 100 mg by mouth 2 (two) times daily. 1 tablet in the morning and 1 tablet at bedtime  . doxazosin (CARDURA) 4 MG tablet Take 1 tablet (4 mg total) by mouth daily.  . finasteride (PROSCAR) 5 MG tablet Take 1 tablet (5 mg total) by mouth daily.  . fluticasone (FLONASE) 50 MCG/ACT nasal spray Place 2 sprays into both nostrils at bedtime. PRN  . furosemide (LASIX) 40 MG tablet Take 1 tablet (40 mg total) by mouth daily.  Marland Kitchen HYDROcodone-acetaminophen (NORCO/VICODIN) 5-325 MG tablet Take 1-2 tablets by mouth every 6 (six) hours as needed for moderate pain.  Marland Kitchen levothyroxine (SYNTHROID, LEVOTHROID) 200 MCG tablet Take 1 tablet (200 mcg total) by mouth daily before breakfast.  . losartan (COZAAR) 50 MG tablet Take 1 tablet (50 mg total) by mouth daily.  . metoprolol succinate (TOPROL-XL) 50 MG 24 hr tablet Take 1 tablet (50 mg total) by mouth  daily. Take with or immediately following a meal.  . omeprazole (PRILOSEC) 20 MG capsule Take 1 capsule (20 mg total) by mouth 2 (two) times daily.  Marland Kitchen OVER THE COUNTER MEDICATION daily. Vitamin A to Z 50 plus  . polyethylene glycol (MIRALAX / GLYCOLAX) packet Take 17 g by mouth daily.  . silver sulfADIAZINE (SILVADENE) 1 % cream Apply 1 application topically daily.  Marland Kitchen spironolactone (ALDACTONE) 25 MG tablet Take 1 tablet (25 mg total) by mouth daily.  Marland Kitchen warfarin (COUMADIN) 5 MG tablet Takes 7.5 mg daily or as directed. (Patient taking differently: Takes 7.5 mg daily for 5 days and 10mg  for 2 days (Friday, Monday) or as  directed. Get checked every 3-4 weeks for INR)   No current facility-administered medications on file prior to visit.    Allergies  Allergen Reactions  . Other Swelling    SODIUM SENSITIVE   PMHx:   Past Medical History:  Diagnosis Date  . A-fib ([place]) 01/2014  . AICD (automatic cardioverter/defibrillator) present    [employer]  . Arthritis   . ASHD (arteriosclerotic heart disease) 2015   s/p CABG  . Cataract    s/p left  . COPD (chronic obstructive pulmonary disease) ([place])    by CXR, pt unsure of this  . GERD (gastroesophageal reflux disease)   . Hyperlipidemia   . Hypertension   . Hypothyroidism   . LBBB (left bundle branch block)   . Pre-diabetes   . Prediabetes 01/2014  . Presence of permanent cardiac pacemaker   . Sleep apnea    no CPAP  . Thyroid disease    hypothyroid   Immunization History  Administered Date(s) Administered  . Influenza, High Dose Seasonal PF 04/23/2015  . Influenza,inj,Quad PF,36+ Mos 01/31/2016  . Pneumococcal Conjugate-13 11/21/2014   Past Surgical History:  Procedure Laterality Date  . CARDIAC DEFIBRILLATOR PLACEMENT  07/2014  . CATARACT EXTRACTION W/ INTRAOCULAR LENS IMPLANT Left   . CORONARY ARTERY BYPASS GRAFT  11/2013  . lumb laminectomy  [phone]   x 3  . LUMBAR FUSION  2013  . NASAL SEPTOPLASTY W/ TURBINOPLASTY Bilateral 01/30/2016   Procedure: NASAL SEPTOPLASTY WITH BILATERAL TURBINATE REDUCTION;  Surgeon: Rozetta Nunnery, MD;  Location: [facility];  Service: ENT;  Laterality: Bilateral;  . UVULECTOMY N/A 01/30/2016   Procedure: UVULECTOMY;  Surgeon: Rozetta Nunnery, MD;  Location: [facility] OR;  Service: ENT;  Laterality: N/A;   FHx:    Reviewed / unchanged  SHx:    Reviewed / unchanged  Systems Review:  Constitutional: Denies fever, chills, wt changes, headaches, insomnia, fatigue, night sweats, change in appetite. Eyes: Denies redness, blurred vision, diplopia, discharge, itchy, watery eyes.  ENT: Denies  discharge, congestion, post nasal drip, epistaxis, sore throat, earache, hearing loss, dental pain, tinnitus, vertigo, sinus pain, snoring.  CV: Denies chest pain, palpitations, irregular heartbeat, syncope, dyspnea, diaphoresis, orthopnea, PND, claudication or edema. Respiratory: denies cough, dyspnea, DOE, pleurisy, hoarseness, laryngitis, wheezing.  Gastrointestinal: Denies dysphagia, odynophagia, heartburn, reflux, water brash, abdominal pain or cramps, nausea, vomiting, bloating, diarrhea, constipation, hematemesis, melena, hematochezia  or hemorrhoids. Genitourinary: Denies dysuria, frequency, urgency, nocturia, hesitancy, discharge, hematuria or flank pain. Musculoskeletal: Denies arthralgias, myalgias, stiffness, jt. swelling, pain, limping or strain/sprain.  Skin: Denies pruritus, rash, hives, warts, acne, eczema or change in skin lesion(s). Neuro: No weakness, tremor, incoordination, spasms, paresthesia or pain. Psychiatric: Denies confusion, memory loss or sensory loss. Endo: Denies change in weight, skin or hair change.  Heme/Lymph: No excessive bleeding, bruising or  enlarged lymph nodes.  Physical Exam  BP 138/72   Pulse 62   Temp 97.7 F (36.5 C)   Resp 16   Ht 5\' 9"  (1.753 m)   Wt 266 lb 9.6 oz (120.9 kg)   BMI 39.37 kg/m   Appears well nourished, well groomed  and in no distress.  Eyes: PERRLA, EOMs, conjunctiva no swelling or erythema. Sinuses: No frontal/maxillary tenderness ENT/Mouth: EAC's clear, TM's nl w/o erythema, bulging. Nares clear w/o erythema, swelling, exudates. Oropharynx clear without erythema or exudates. Oral hygiene is good. Tongue normal, non obstructing. Hearing intact.  Neck: Supple. Thyroid nl. Car 2+/2+ without bruits, nodes or JVD. Chest: Respirations nl with BS clear & equal w/o rales, rhonchi, wheezing or stridor.  Cor: Heart sounds soft w/ sl irregular rate and rhythm without sig. murmurs, gallops, clicks or rubs. Peripheral pulses normal and  equal  without edema.  Abdomen: Soft & bowel sounds normal. Non-tender w/o guarding, rebound, hernias, masses or organomegaly.  Lymphatics: Unremarkable.  Musculoskeletal: Full ROM all peripheral extremities, joint stability, 5/5 strength and normal gait.  Skin: Warm, dry without exposed rashes, lesions or ecchymosis apparent.  Neuro: Cranial nerves intact, reflexes equal bilaterally. Sensory-motor testing grossly intact. Tendon reflexes grossly intact.  Pysch: Alert & oriented x 3.  Insight and judgement nl & appropriate. No ideations.  Assessment and Plan:  1. Essential hypertension  - Continue medication, monitor blood pressure at home.  - Continue DASH diet. Reminder to go to the ER if any CP,  SOB, nausea, dizziness, severe HA, changes vision/speech,  left arm numbness and tingling and jaw pain.  - CBC with Differential/Platelet - BASIC METABOLIC PANEL WITH GFR - Magnesium - TSH  2. Hyperlipemia, mixed  - Continue diet/meds, exercise,& lifestyle modifications.  - Continue monitor periodic cholesterol/liver & renal functions   - Hepatic function panel - Lipid panel - TSH  3. Prediabetes  - Continue diet, exercise, lifestyle modifications.  - Monitor appropriate labs.  - Hemoglobin A1c - Insulin, random  4. Vitamin D deficiency  - Continue supplementation. - VITAMIN D 25 Hydroxy   5. ASHD/CABG x 3V (11/2013)   6. Atrial fibrillation ([place])   7. Gout  - Uric acid  8. Medication management  - CBC with Differential/Platelet - BASIC METABOLIC PANEL WITH GFR - Hepatic function panel - Magnesium - Lipid panel - TSH - Hemoglobin A1c - Insulin, random - VITAMIN D 25 Hydroxy  - Uric acid       Discussed  regular exercise, BP monitoring, weight control to achieve/maintain BMI less than 25 and discussed med and SE's. Recommended labs to assess and monitor clinical status with further disposition pending results of labs. Over 30 minutes of exam, counseling,  chart review was performed.

## 2016-10-28 NOTE — Patient Instructions (Signed)

## 2016-10-29 ENCOUNTER — Encounter: Payer: Self-pay | Admitting: Internal Medicine

## 2016-10-29 LAB — CBC WITH DIFFERENTIAL/PLATELET
BASOS PCT: 2 %
Basophils Absolute: 226 cells/uL — ABNORMAL HIGH (ref 0–200)
EOS PCT: 3 %
Eosinophils Absolute: 339 cells/uL (ref 15–500)
HEMATOCRIT: 39.7 % (ref 38.5–50.0)
HEMOGLOBIN: 13 g/dL — AB (ref 13.2–17.1)
LYMPHS ABS: 1808 {cells}/uL (ref 850–3900)
Lymphocytes Relative: 16 %
MCH: 28.9 pg (ref 27.0–33.0)
MCHC: 32.7 g/dL (ref 32.0–36.0)
MCV: 88.2 fL (ref 80.0–100.0)
MONO ABS: 452 {cells}/uL (ref 200–950)
MPV: 9.9 fL (ref 7.5–12.5)
Monocytes Relative: 4 %
NEUTROS ABS: 8475 {cells}/uL — AB (ref 1500–7800)
Neutrophils Relative %: 75 %
Platelets: 449 10*3/uL — ABNORMAL HIGH (ref 140–400)
RBC: 4.5 MIL/uL (ref 4.20–5.80)
RDW: 20.4 % — ABNORMAL HIGH (ref 11.0–15.0)
WBC: 11.3 10*3/uL — AB (ref 3.8–10.8)

## 2016-10-29 LAB — VITAMIN D 25 HYDROXY (VIT D DEFICIENCY, FRACTURES): Vit D, 25-Hydroxy: 66 ng/mL (ref 30–100)

## 2016-10-29 LAB — INSULIN, RANDOM: Insulin: 14 u[IU]/mL (ref 2.0–19.6)

## 2016-10-29 LAB — HEMOGLOBIN A1C
HEMOGLOBIN A1C: 6.3 % — AB (ref <5.7)
MEAN PLASMA GLUCOSE: 134 mg/dL

## 2016-10-29 LAB — URIC ACID: Uric Acid, Serum: 5.8 mg/dL (ref 4.0–8.0)

## 2016-10-29 LAB — MAGNESIUM: Magnesium: 1.9 mg/dL (ref 1.5–2.5)

## 2016-10-30 ENCOUNTER — Encounter: Payer: Self-pay | Admitting: Internal Medicine

## 2016-11-03 DIAGNOSIS — I4891 Unspecified atrial fibrillation: Secondary | ICD-10-CM | POA: Diagnosis not present

## 2016-11-03 DIAGNOSIS — Z7901 Long term (current) use of anticoagulants: Secondary | ICD-10-CM | POA: Diagnosis not present

## 2016-11-03 DIAGNOSIS — I255 Ischemic cardiomyopathy: Secondary | ICD-10-CM | POA: Diagnosis not present

## 2016-11-03 DIAGNOSIS — Z95810 Presence of automatic (implantable) cardiac defibrillator: Secondary | ICD-10-CM | POA: Diagnosis not present

## 2016-11-06 DIAGNOSIS — Z1211 Encounter for screening for malignant neoplasm of colon: Secondary | ICD-10-CM | POA: Diagnosis not present

## 2016-11-06 DIAGNOSIS — Z1212 Encounter for screening for malignant neoplasm of rectum: Secondary | ICD-10-CM | POA: Diagnosis not present

## 2016-11-12 LAB — COLOGUARD

## 2016-11-21 ENCOUNTER — Other Ambulatory Visit: Payer: Self-pay | Admitting: Internal Medicine

## 2016-12-08 DIAGNOSIS — Z7901 Long term (current) use of anticoagulants: Secondary | ICD-10-CM | POA: Diagnosis not present

## 2016-12-08 DIAGNOSIS — Z95810 Presence of automatic (implantable) cardiac defibrillator: Secondary | ICD-10-CM | POA: Diagnosis not present

## 2016-12-08 DIAGNOSIS — I255 Ischemic cardiomyopathy: Secondary | ICD-10-CM | POA: Diagnosis not present

## 2016-12-09 ENCOUNTER — Encounter: Payer: Self-pay | Admitting: Internal Medicine

## 2016-12-30 DIAGNOSIS — I255 Ischemic cardiomyopathy: Secondary | ICD-10-CM | POA: Diagnosis not present

## 2016-12-30 DIAGNOSIS — I10 Essential (primary) hypertension: Secondary | ICD-10-CM | POA: Diagnosis not present

## 2016-12-30 DIAGNOSIS — Z95810 Presence of automatic (implantable) cardiac defibrillator: Secondary | ICD-10-CM | POA: Diagnosis not present

## 2016-12-30 DIAGNOSIS — R0602 Shortness of breath: Secondary | ICD-10-CM | POA: Diagnosis not present

## 2017-01-02 DIAGNOSIS — I519 Heart disease, unspecified: Secondary | ICD-10-CM | POA: Diagnosis not present

## 2017-01-02 DIAGNOSIS — I083 Combined rheumatic disorders of mitral, aortic and tricuspid valves: Secondary | ICD-10-CM | POA: Diagnosis not present

## 2017-01-02 DIAGNOSIS — I517 Cardiomegaly: Secondary | ICD-10-CM | POA: Diagnosis not present

## 2017-01-08 DIAGNOSIS — I10 Essential (primary) hypertension: Secondary | ICD-10-CM | POA: Diagnosis not present

## 2017-01-08 DIAGNOSIS — I2510 Atherosclerotic heart disease of native coronary artery without angina pectoris: Secondary | ICD-10-CM | POA: Diagnosis not present

## 2017-01-08 DIAGNOSIS — I255 Ischemic cardiomyopathy: Secondary | ICD-10-CM | POA: Diagnosis not present

## 2017-01-08 DIAGNOSIS — Z4502 Encounter for adjustment and management of automatic implantable cardiac defibrillator: Secondary | ICD-10-CM | POA: Diagnosis not present

## 2017-01-08 DIAGNOSIS — I480 Paroxysmal atrial fibrillation: Secondary | ICD-10-CM | POA: Diagnosis not present

## 2017-01-12 DIAGNOSIS — I480 Paroxysmal atrial fibrillation: Secondary | ICD-10-CM | POA: Diagnosis not present

## 2017-01-12 DIAGNOSIS — I255 Ischemic cardiomyopathy: Secondary | ICD-10-CM | POA: Diagnosis not present

## 2017-01-12 DIAGNOSIS — Z95810 Presence of automatic (implantable) cardiac defibrillator: Secondary | ICD-10-CM | POA: Diagnosis not present

## 2017-01-12 DIAGNOSIS — Z7901 Long term (current) use of anticoagulants: Secondary | ICD-10-CM | POA: Diagnosis not present

## 2017-02-02 ENCOUNTER — Encounter: Payer: Self-pay | Admitting: Internal Medicine

## 2017-02-17 NOTE — Progress Notes (Signed)
[name]   [name], M.D.     [name]. [name], P.A.-C [name], [place]                [address] [place], [place] [address] Telephone [phone] Telefax [phone] Comprehensive Evaluation & Examination     This very nice 76 y.o. MWM  presents for a comprehensive evaluation and management of multiple medical co-morbidities.  Patient has been followed for Hypertension, ASHD/CABG, OSA/CPAP, Hyperlipidemia, Pre-Diabetes and Vitamin D Deficiency. Also he has Gout controlled on his meds.  His GERD is controlled with prudent diet and his meds.       HTN predates since age 73 yo (1965)  Patient's BP has been controlled at home.  Today's BP is at goal - 116/78.  In 2015, he underwent a CABG x 3 V w/po Afib- and failed 2 attempts at VCV. Then in 07/2014, he had a BiVent, AICD/Defib/PM planted. He is followed at his Cardiologist's coumadin clinic.  Patient denies any cardiac symptoms as chest pain, palpitations, shortness of breath, dizziness or ankle swelling.     Patient's hyperlipidemia is controlled with diet and medications. Patient denies myalgias or other medication SE's. Last lipids were at goal albeit sl elevated Trig's: Lab Results  Component Value Date   CHOL 150 10/28/2016   HDL 20 (L) 10/28/2016   LDLCALC 97 10/28/2016   TRIG 165 (H) 10/28/2016   CHOLHDL 7.5 (H) 10/28/2016      Patient is on Thyroid Replacement.Patient has Morbid Obesity (BMI 39+) and consequent prediabetes (A1c 6.0%  / 2015)  and patient denies reactive hypoglycemic symptoms, visual blurring, diabetic polys or paresthesias. Last A1c was not at goal: Lab Results  Component Value Date   HGBA1C 6.3 (H) 10/28/2016       Finally, patient has history of Vitamin D Deficiency ("39" / 2016) and last vitamin D was at goal:  Lab Results  Component Value Date   VD25OH 66 10/28/2016   Current Outpatient  Prescriptions on File Prior to Visit  Medication Sig  . acetaminophen (TYLENOL) 500 MG tablet Take 500 mg by mouth every 6 (six) hours as needed for mild pain.   Marland Kitchen albuterol (PROVENTIL HFA;VENTOLIN HFA) 108 (90 Base) MCG/ACT inhaler Inhale 2 puffs into the lungs every 6 (six) hours as needed for wheezing or shortness of breath.  . allopurinol (ZYLOPRIM) 300 MG tablet Take 1 tablet (300 mg total) by mouth daily.  Marland Kitchen aspirin EC 81 MG tablet Take 81 mg by mouth daily.  Marland Kitchen atorvastatin (LIPITOR) 40 MG tablet Take 1 tablet (40 mg total) by mouth daily.  . Black Pepper-Turmeric (TURMERIC COMPLEX/BLACK PEPPER PO) Take 900 mg by mouth daily.   . Cholecalciferol (VITAMIN D PO) Take 15,000 Units by mouth daily.   . diphenhydrAMINE (BENADRYL) 25 MG tablet Take 25 mg by mouth at bedtime.  . docusate sodium (COLACE) 100 MG capsule Take 100 mg by mouth 2 (two) times daily. 1 tablet in the morning and 1 tablet at bedtime  . doxazosin (CARDURA) 4 MG tablet Take 1 tablet (4 mg total) by mouth daily.  . finasteride (PROSCAR) 5 MG tablet Take 1 tablet (5 mg total) by mouth daily.  . fluticasone (FLONASE) 50 MCG/ACT nasal spray Place 2 sprays into both nostrils at bedtime. PRN  . furosemide (LASIX) 40 MG tablet Take 1 tablet (40  mg total) by mouth daily.  Marland Kitchen HYDROcodone-acetaminophen (NORCO/VICODIN) 5-325 MG tablet Take 1-2 tablets by mouth every 6 (six) hours as needed for moderate pain.  Marland Kitchen levothyroxine (SYNTHROID, LEVOTHROID) 200 MCG tablet Take 1 tablet (200 mcg total) by mouth daily before breakfast.  . losartan (COZAAR) 50 MG tablet Take 1 tablet (50 mg total) by mouth daily.  . metoprolol succinate (TOPROL-XL) 50 MG 24 hr tablet Take 1 tablet (50 mg total) by mouth daily. Take with or immediately following a meal.  . omeprazole (PRILOSEC) 20 MG capsule Take 1 capsule (20 mg total) by mouth 2 (two) times daily.  Marland Kitchen OVER THE COUNTER MEDICATION daily. Vitamin A to Z 50 plus  . polyethylene glycol (MIRALAX /  GLYCOLAX) packet Take 17 g by mouth daily.  . silver sulfADIAZINE (SILVADENE) 1 % cream Apply 1 application topically daily.  Marland Kitchen spironolactone (ALDACTONE) 25 MG tablet TAKE 1 TABLET EVERY DAY  . warfarin (COUMADIN) 5 MG tablet Takes 7.5 mg daily or as directed. (Patient taking differently: Takes 7.5 mg daily for 5 days and 10mg  for 2 days (Friday, Monday) or as directed. Get checked every 3-4 weeks for INR)   No current facility-administered medications on file prior to visit.    Allergies  Allergen Reactions  . Other Swelling    SODIUM SENSITIVE   Past Medical History:  Diagnosis Date  . A-fib ([place]) 01/2014  . AICD (automatic cardioverter/defibrillator) present    [employer]  . Arthritis   . ASHD (arteriosclerotic heart disease) 2015   s/p CABG  . Cataract    s/p left  . COPD (chronic obstructive pulmonary disease) ([place])    by CXR, pt unsure of this  . GERD (gastroesophageal reflux disease)   . Hyperlipidemia   . Hypertension   . Hypothyroidism   . LBBB (left bundle branch block)   . Pre-diabetes   . Prediabetes 01/2014  . Presence of permanent cardiac pacemaker   . Sleep apnea    no CPAP  . Thyroid disease    hypothyroid   Health Maintenance  Topic Date Due  . TETANUS/TDAP  09/25/1959  . PNA vac Low Risk Adult (2 of 2 - PPSV23) 11/21/2015  . INFLUENZA VACCINE  12/17/2016   Immunization History  Administered Date(s) Administered  . Influenza, High Dose Seasonal PF 04/23/2015  . Influenza,inj,Quad PF,6+ Mos 01/31/2016  . Pneumococcal Conjugate-13 11/21/2014   Past Surgical History:  Procedure Laterality Date  . CARDIAC DEFIBRILLATOR PLACEMENT  07/2014  . CATARACT EXTRACTION W/ INTRAOCULAR LENS IMPLANT Left   . CORONARY ARTERY BYPASS GRAFT  11/2013  . lumb laminectomy  [phone]   x 3  . LUMBAR FUSION  2013  . NASAL SEPTOPLASTY W/ TURBINOPLASTY Bilateral 01/30/2016   Procedure: NASAL SEPTOPLASTY WITH BILATERAL TURBINATE REDUCTION;  Surgeon: [name] Nunnery, MD;  Location: [facility];  Service: ENT;  Laterality: Bilateral;  . UVULECTOMY N/A 01/30/2016   Procedure: UVULECTOMY;  Surgeon: [name] Nunnery, MD;  Location: [facility];  Service: ENT;  Laterality: N/A;   Family History  Problem Relation Age of Onset  . Alzheimer's disease Mother   . Mental illness Mother   . Depression Mother   . Heart disease Father 52       had 5 MI's & died 17 yo  . Alcohol abuse Son   . Stroke Maternal Grandmother    Social History   Social History  . Marital status: Married    Spouse name: [name]   . Number  of children: N/A  . Years of education: 16   Occupational History  . Retired    Social History Main Topics  . Smoking status: Former Smoker    Quit date: 05/20/1983  . Smokeless tobacco: Never Used  . Alcohol use 0.0 oz/week     Comment: occass  . Drug use: No  . Sexual activity: No   Social History Narrative   Lives with wife, [name]   Caffeine use: daily (Coffee)    ROS Constitutional: Denies fever, chills, weight loss/gain, headaches, insomnia,  night sweats or change in appetite. Does c/o fatigue. Eyes: Denies redness, blurred vision, diplopia, discharge, itchy or watery eyes.  ENT: Denies discharge, congestion, post nasal drip, epistaxis, sore throat, earache, hearing loss, dental pain, Tinnitus, Vertigo, Sinus pain or snoring.  Cardio: Denies chest pain, palpitations, irregular heartbeat, syncope, dyspnea, diaphoresis, orthopnea, PND, claudication or edema Respiratory: denies cough, dyspnea, DOE, pleurisy, hoarseness, laryngitis or wheezing.  Gastrointestinal: Denies dysphagia, heartburn, reflux, water brash, pain, cramps, nausea, vomiting, bloating, diarrhea, constipation, hematemesis, melena, hematochezia, jaundice or hemorrhoids Genitourinary: Denies dysuria, but endorses LUTS. Musculoskeletal: Denies arthralgia, myalgia, stiffness, Jt. Swelling, pain, limp or strain/sprain. Denies Falls. Skin: Denies puritis, rash, hives, warts,  acne, eczema or change in skin lesion Neuro: No weakness, tremor, incoordination, spasms, paresthesia or pain Psychiatric: Denies confusion, memory loss or sensory loss. Denies Depression. Endocrine: Denies change in weight, skin, hair change, nocturia, and paresthesia, diabetic polys, visual blurring or hyper / hypo glycemic episodes.  Heme/Lymph: No excessive bleeding, bruising or enlarged lymph nodes.  Physical Exam  BP 116/78   Pulse 64   Temp (!) 97.3 F (36.3 C)   Resp 20   Ht 5' 9.5" (1.765 m)   Wt 268 lb 12.8 oz (121.9 kg)   BMI 39.13 kg/m   General Appearance: Over nourished and well groomed and in no apparent distress.  Eyes: PERRLA, EOMs, conjunctiva no swelling or erythema, normal fundi and vessels. Sinuses: No frontal/maxillary tenderness ENT/Mouth: EACs patent / TMs  nl. Nares clear without erythema, swelling, mucoid exudates. Oral hygiene is good. No erythema, swelling, or exudate. Tongue normal, non-obstructing. Tonsils not swollen or erythematous. Hearing normal.  Neck: Supple, thyroid normal. No bruits, nodes or JVD. Respiratory: Respiratory effort normal.  BS equal and clear bilateral without rales, rhonci, wheezing or stridor. Cardio: Heart sounds are normal with regular rate and rhythm and no murmurs, rubs or gallops. Peripheral pulses are normal and equal bilaterally without edema. No aortic or femoral bruits. Chest: symmetric with normal excursions and percussion.  Abdomen: Soft, with Nl bowel sounds. Nontender, no guarding, rebound, hernias, masses, or organomegaly.  Lymphatics: Non tender without lymphadenopathy.  Genitourinary: No hernias.Testes nl. DRE - prostate nl for age - smooth & firm w/o nodules. Musculoskeletal: Full ROM all peripheral extremities, joint stability, 5/5 strength, and normal gait. Skin: Warm and dry without rashes, lesions, cyanosis, clubbing or  ecchymosis.  Neuro: Cranial nerves intact, reflexes equal bilaterally. Normal muscle tone,  no cerebellar symptoms. Sensation intact.  Pysch: Alert and oriented X 3 with normal affect, insight and judgment appropriate.   Assessment and Plan  1. Essential hypertension  - [place], RETROPERITNL ABD,  LTD - Urinalysis, Routine w reflex microscopic - Microalbumin / creatinine urine ratio - CBC with Differential/Platelet - BASIC METABOLIC PANEL WITH GFR - Magnesium - TSH  2. Hyperlipemia, mixed  - [place], RETROPERITNL ABD,  LTD - Hepatic function panel - Lipid panel  3. Prediabetes  - Hemoglobin A1c - Insulin, random  4. Vitamin D deficiency  - VITAMIN D 25 Hydroxy  5. ASHD/CABG x 3V (11/2013)  - [place], RETROPERITNL ABD,  LTD - Hemoglobin A1c - Insulin, random  6. Paroxysmal atrial fibrillation ([facility])  - [place], RETROPERITNL ABD,  LTD  7. Abnormal glucose   8. Gout  - Uric acid  9. OSA (obstructive sleep apnea)   10. Hypothyroidism  - TSH  11. Gastroesophageal reflux disease   12. Colon cancer screening  - CBC with Differential/Platelet  13. Prostate cancer screening  - PSA  14. Benign prostatic hyperplasia with lower urinary tract symptoms  - PSA  15. Cardiac pacemaker/AICD (01/2014)   16. Screening for AAA (aortic abdominal aneurysm)  - [place], RETROPERITNL ABD,  LTD  17. Abdominal aortic aneurysm (AAA) without rupture ([place])  - US Aorta; Future  18. Medication management  - Urinalysis, Routine w reflex microscopic - Microalbumin / creatinine urine ratio - CBC with Differential/Platelet - BASIC METABOLIC PANEL WITH GFR - Hepatic function panel - Magnesium - Lipid panel - TSH - Hemoglobin A1c - Insulin, random - VITAMIN D 25 Hydroxy  - Uric acid      Patient was counseled in prudent diet, weight control to achieve/maintain BMI less than 25, BP monitoring, regular exercise and medications as discussed.  Discussed med effects and SE's. Routine screening labs and tests as requested with regular follow-up as recommended. Over 40 minutes of exam,  counseling, chart review and high complex critical decision making was performed

## 2017-02-18 ENCOUNTER — Ambulatory Visit (INDEPENDENT_AMBULATORY_CARE_PROVIDER_SITE_OTHER): Payer: Medicare Other | Admitting: Internal Medicine

## 2017-02-18 ENCOUNTER — Encounter: Payer: Self-pay | Admitting: Internal Medicine

## 2017-02-18 VITALS — BP 116/78 | HR 64 | Temp 97.3°F | Resp 20 | Ht 69.5 in | Wt 268.8 lb

## 2017-02-18 DIAGNOSIS — G4733 Obstructive sleep apnea (adult) (pediatric): Secondary | ICD-10-CM | POA: Diagnosis not present

## 2017-02-18 DIAGNOSIS — R7303 Prediabetes: Secondary | ICD-10-CM | POA: Diagnosis not present

## 2017-02-18 DIAGNOSIS — I2510 Atherosclerotic heart disease of native coronary artery without angina pectoris: Secondary | ICD-10-CM

## 2017-02-18 DIAGNOSIS — I714 Abdominal aortic aneurysm, without rupture, unspecified: Secondary | ICD-10-CM

## 2017-02-18 DIAGNOSIS — Z136 Encounter for screening for cardiovascular disorders: Secondary | ICD-10-CM

## 2017-02-18 DIAGNOSIS — Z125 Encounter for screening for malignant neoplasm of prostate: Secondary | ICD-10-CM

## 2017-02-18 DIAGNOSIS — Z23 Encounter for immunization: Secondary | ICD-10-CM

## 2017-02-18 DIAGNOSIS — R7309 Other abnormal glucose: Secondary | ICD-10-CM

## 2017-02-18 DIAGNOSIS — Z95810 Presence of automatic (implantable) cardiac defibrillator: Secondary | ICD-10-CM | POA: Diagnosis not present

## 2017-02-18 DIAGNOSIS — I480 Paroxysmal atrial fibrillation: Secondary | ICD-10-CM

## 2017-02-18 DIAGNOSIS — Z7901 Long term (current) use of anticoagulants: Secondary | ICD-10-CM | POA: Diagnosis not present

## 2017-02-18 DIAGNOSIS — K219 Gastro-esophageal reflux disease without esophagitis: Secondary | ICD-10-CM

## 2017-02-18 DIAGNOSIS — E559 Vitamin D deficiency, unspecified: Secondary | ICD-10-CM

## 2017-02-18 DIAGNOSIS — N401 Enlarged prostate with lower urinary tract symptoms: Secondary | ICD-10-CM

## 2017-02-18 DIAGNOSIS — Z79899 Other long term (current) drug therapy: Secondary | ICD-10-CM

## 2017-02-18 DIAGNOSIS — Z950 Presence of cardiac pacemaker: Secondary | ICD-10-CM

## 2017-02-18 DIAGNOSIS — I255 Ischemic cardiomyopathy: Secondary | ICD-10-CM | POA: Diagnosis not present

## 2017-02-18 DIAGNOSIS — E039 Hypothyroidism, unspecified: Secondary | ICD-10-CM | POA: Diagnosis not present

## 2017-02-18 DIAGNOSIS — E782 Mixed hyperlipidemia: Secondary | ICD-10-CM

## 2017-02-18 DIAGNOSIS — Z1211 Encounter for screening for malignant neoplasm of colon: Secondary | ICD-10-CM

## 2017-02-18 DIAGNOSIS — I10 Essential (primary) hypertension: Secondary | ICD-10-CM | POA: Diagnosis not present

## 2017-02-18 DIAGNOSIS — M109 Gout, unspecified: Secondary | ICD-10-CM

## 2017-02-18 NOTE — Patient Instructions (Signed)

## 2017-02-19 ENCOUNTER — Ambulatory Visit
Admission: RE | Admit: 2017-02-19 | Discharge: 2017-02-19 | Disposition: A | Discharge status: Home / Self Care | Payer: Medicare Other | Source: Ambulatory Visit | Attending: Internal Medicine | Admitting: Internal Medicine

## 2017-02-19 ENCOUNTER — Other Ambulatory Visit: Payer: Self-pay | Admitting: Internal Medicine

## 2017-02-19 DIAGNOSIS — I714 Abdominal aortic aneurysm, without rupture, unspecified: Secondary | ICD-10-CM

## 2017-02-19 LAB — URINALYSIS, ROUTINE W REFLEX MICROSCOPIC
BILIRUBIN URINE: NEGATIVE
Glucose, UA: NEGATIVE
HGB URINE DIPSTICK: NEGATIVE
KETONES UR: NEGATIVE
Leukocytes, UA: NEGATIVE
Nitrite: NEGATIVE
PH: 6.5 (ref 5.0–8.0)
Protein, ur: NEGATIVE
SPECIFIC GRAVITY, URINE: 1.014 (ref 1.001–1.03)

## 2017-02-19 LAB — HEPATIC FUNCTION PANEL
AG RATIO: 2.2 (calc) (ref 1.0–2.5)
ALT: 23 U/L (ref 9–46)
AST: 25 U/L (ref 10–35)
Albumin: 4.7 g/dL (ref 3.6–5.1)
Alkaline phosphatase (APISO): 56 U/L (ref 40–115)
BILIRUBIN INDIRECT: 0.6 mg/dL (ref 0.2–1.2)
BILIRUBIN TOTAL: 0.8 mg/dL (ref 0.2–1.2)
Bilirubin, Direct: 0.2 mg/dL (ref 0.0–0.2)
GLOBULIN: 2.1 g/dL (ref 1.9–3.7)
TOTAL PROTEIN: 6.8 g/dL (ref 6.1–8.1)

## 2017-02-19 LAB — CBC WITH DIFFERENTIAL/PLATELET
BASOS PCT: 1.7 %
Basophils Absolute: 197 cells/uL (ref 0–200)
EOS ABS: 197 {cells}/uL (ref 15–500)
Eosinophils Relative: 1.7 %
HEMATOCRIT: 40.3 % (ref 38.5–50.0)
HEMOGLOBIN: 13.3 g/dL (ref 13.2–17.1)
Lymphs Abs: 1543 cells/uL (ref 850–3900)
MCH: 29.4 pg (ref 27.0–33.0)
MCHC: 33 g/dL (ref 32.0–36.0)
MCV: 89 fL (ref 80.0–100.0)
MONOS PCT: 4.1 %
MPV: 11.1 fL (ref 7.5–12.5)
NEUTROS ABS: 9187 {cells}/uL — AB (ref 1500–7800)
Neutrophils Relative %: 79.2 %
Platelets: 652 10*3/uL — ABNORMAL HIGH (ref 140–400)
RBC: 4.53 10*6/uL (ref 4.20–5.80)
RDW: 17.7 % — ABNORMAL HIGH (ref 11.0–15.0)
Total Lymphocyte: 13.3 %
WBC: 11.6 10*3/uL — ABNORMAL HIGH (ref 3.8–10.8)
WBCMIX: 476 {cells}/uL (ref 200–950)

## 2017-02-19 LAB — HEMOGLOBIN A1C
Hgb A1c MFr Bld: 5.8 % of total Hgb — ABNORMAL HIGH (ref <5.7)
Mean Plasma Glucose: 120 (calc)
eAG (mmol/L): 6.6 (calc)

## 2017-02-19 LAB — BASIC METABOLIC PANEL WITH GFR
BUN: 19 mg/dL (ref 7–25)
CO2: 32 mmol/L (ref 20–32)
CREATININE: 0.95 mg/dL (ref 0.70–1.18)
Calcium: 9.6 mg/dL (ref 8.6–10.3)
Chloride: 101 mmol/L (ref 98–110)
GFR, EST NON AFRICAN AMERICAN: 77 mL/min/{1.73_m2} (ref >=60)
GFR, Est African American: 90 mL/min/{1.73_m2} (ref >=60)
GLUCOSE: 96 mg/dL (ref 65–99)
Potassium: 4.9 mmol/L (ref 3.5–5.3)
SODIUM: 139 mmol/L (ref 135–146)

## 2017-02-19 LAB — MICROALBUMIN / CREATININE URINE RATIO
Creatinine, Urine: 68 mg/dL (ref 20–320)
Microalb Creat Ratio: 56 mcg/mg creat — ABNORMAL HIGH (ref <30)
Microalb, Ur: 3.8 mg/dL

## 2017-02-19 LAB — TSH: TSH: 5.67 mIU/L — ABNORMAL HIGH (ref 0.40–4.50)

## 2017-02-19 LAB — LIPID PANEL
CHOL/HDL RATIO: 7.2 (calc) — AB (ref <5.0)
CHOLESTEROL: 123 mg/dL (ref <200)
HDL: 17 mg/dL — AB (ref >40)
LDL CHOLESTEROL (CALC): 79 mg/dL
NON-HDL CHOLESTEROL (CALC): 106 mg/dL (ref <130)
Triglycerides: 177 mg/dL — ABNORMAL HIGH (ref <150)

## 2017-02-19 LAB — URIC ACID: URIC ACID, SERUM: 5.3 mg/dL (ref 4.0–8.0)

## 2017-02-19 LAB — INSULIN, RANDOM: Insulin: 4.7 u[IU]/mL (ref 2.0–19.6)

## 2017-02-19 LAB — VITAMIN D 25 HYDROXY (VIT D DEFICIENCY, FRACTURES): Vit D, 25-Hydroxy: 79 ng/mL (ref 30–100)

## 2017-02-19 LAB — PSA: PSA: 0.1 ng/mL (ref <=4.0)

## 2017-02-19 LAB — MAGNESIUM: Magnesium: 1.9 mg/dL (ref 1.5–2.5)

## 2017-02-19 NOTE — Addendum Note (Signed)
Addended by: Melbourne Abts C on: 02/19/2017 08:52 AM   Modules accepted: Orders

## 2017-03-18 DIAGNOSIS — Z7901 Long term (current) use of anticoagulants: Secondary | ICD-10-CM | POA: Diagnosis not present

## 2017-03-18 DIAGNOSIS — I255 Ischemic cardiomyopathy: Secondary | ICD-10-CM | POA: Diagnosis not present

## 2017-03-18 DIAGNOSIS — I480 Paroxysmal atrial fibrillation: Secondary | ICD-10-CM | POA: Diagnosis not present

## 2017-03-18 DIAGNOSIS — Z95810 Presence of automatic (implantable) cardiac defibrillator: Secondary | ICD-10-CM | POA: Diagnosis not present

## 2017-03-25 DIAGNOSIS — J029 Acute pharyngitis, unspecified: Secondary | ICD-10-CM | POA: Diagnosis not present

## 2017-03-25 DIAGNOSIS — J310 Chronic rhinitis: Secondary | ICD-10-CM | POA: Diagnosis not present

## 2017-04-22 DIAGNOSIS — I480 Paroxysmal atrial fibrillation: Secondary | ICD-10-CM | POA: Diagnosis not present

## 2017-04-22 DIAGNOSIS — I4891 Unspecified atrial fibrillation: Secondary | ICD-10-CM | POA: Diagnosis not present

## 2017-04-22 DIAGNOSIS — Z95810 Presence of automatic (implantable) cardiac defibrillator: Secondary | ICD-10-CM | POA: Diagnosis not present

## 2017-04-22 DIAGNOSIS — Z7901 Long term (current) use of anticoagulants: Secondary | ICD-10-CM | POA: Diagnosis not present

## 2017-04-22 DIAGNOSIS — I255 Ischemic cardiomyopathy: Secondary | ICD-10-CM | POA: Diagnosis not present

## 2017-05-04 DIAGNOSIS — J310 Chronic rhinitis: Secondary | ICD-10-CM | POA: Diagnosis not present

## 2017-05-04 DIAGNOSIS — J029 Acute pharyngitis, unspecified: Secondary | ICD-10-CM | POA: Diagnosis not present

## 2017-05-04 DIAGNOSIS — F458 Other somatoform disorders: Secondary | ICD-10-CM | POA: Diagnosis not present

## 2017-05-20 DIAGNOSIS — Z7901 Long term (current) use of anticoagulants: Secondary | ICD-10-CM | POA: Diagnosis not present

## 2017-05-20 DIAGNOSIS — I480 Paroxysmal atrial fibrillation: Secondary | ICD-10-CM | POA: Diagnosis not present

## 2017-05-20 DIAGNOSIS — I255 Ischemic cardiomyopathy: Secondary | ICD-10-CM | POA: Diagnosis not present

## 2017-05-20 DIAGNOSIS — Z95810 Presence of automatic (implantable) cardiac defibrillator: Secondary | ICD-10-CM | POA: Diagnosis not present

## 2017-05-25 ENCOUNTER — Other Ambulatory Visit: Payer: Self-pay | Admitting: Physician Assistant

## 2017-05-25 ENCOUNTER — Other Ambulatory Visit: Payer: Self-pay | Admitting: Internal Medicine

## 2017-05-31 NOTE — Progress Notes (Signed)
MEDICARE ANNUAL WELLNESS VISIT AND FOLLOW UP Assessment:   Diagnoses and all orders for this visit:  Medicare annual wellness visit, subsequent  Essential hypertension At goal; continue medications Monitor blood pressure at home; call if consistently over 130/80 Continue DASH diet.   Reminder to go to the ER if any CP, SOB, nausea, dizziness, severe HA, changes vision/speech, left arm numbness and tingling and jaw pain.  Paroxysmal atrial fibrillation ([place]) Continue follow up with a fib clinic for coumadin management Discussed if patient falls to immediately contact office or go to ER. Discussed foods that can increase or decrease Coumadin levels. Patient understands to call the office before starting a new medication.  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   OSA (obstructive sleep apnea) Sleep apnea- continue CPAP, CPAP is helping with daytime fatigue, weight loss still advised.   Gastroesophageal reflux disease, esophagitis presence not specified ? Breakthrough symptoms, may continue prilosec, sent in ranitidine 150 mg BID trial as well Discussed diet, avoiding triggers and other lifestyle changes  Hypothyroidism, unspecified type continue medications the same reminded to take on an empty stomach 30-22mins before food.  -     TSH  Benign prostatic hyperplasia with lower urinary tract symptoms, symptom details unspecified Continue medications; symptoms well controlled Check annual PSA; urology referral as indicated  Mixed hyperlipidemia At goal for LDL; continue statin- discussed diet for borderline trigs Continue low cholesterol diet and exercise.  -     Lipid panel  Prediabetes Discussed disease and risks of elevated glucose Discussed diet/exercise, weight management  -     Hemoglobin A1c  Vitamin D deficiency At goal; continue supplementation Defer vitamin D level  Medication management -     CBC with  Differential/Platelet -     BASIC METABOLIC PANEL WITH GFR -     Hepatic function panel  Morbid obesity ([place]) Long discussion about weight loss, diet, and exercise Recommended diet heavy in fruits and veggies and low in animal meats, cheeses, and dairy products, appropriate calorie intake Discussed appropriate weight for height  Follow up in 3 months  Cardiac pacemaker/AICD (01/2014) Continue follow up with cardiology  Wheezing Some wheezing on exam; LLL somewhat diminished - 50 pack year history Will obtain CXR   Over 30 minutes of exam, counseling, chart review, and critical decision making was performed  Future Appointments  Date Time Provider [place]  06/01/2017  1:15 PM [facility] 2 [facility]  09/01/2017 11:30 AM [name], MD [facility] None  03/22/2018  3:00 PM [name], MD [facility] None     Plan:   During the course of the visit the patient was educated and counseled about appropriate screening and preventive services including:    Pneumococcal vaccine   Influenza vaccine  Prevnar 13  Td vaccine  Screening electrocardiogram  Colorectal cancer screening  Diabetes screening  Glaucoma screening  Nutrition counseling    Subjective:  [name] [name] is a 77 y.o. male who presents for Medicare Annual Wellness Visit and 3 month follow up for HTN, hyperlipidemia, prediabetes, and vitamin D Def.  In 2015, he underwent a CABG x 3 V w/po Afib- and failed 2 attempts at VCV. Then in 07/2014, he had a BiVent, AICD/Defib/PM planted. He is followed at his Cardiologist's coumadin clinic.  Patient denies any cardiac symptoms as chest pain, palpitations, shortness of breath, dizziness or ankle swelling. He does endorse some coughing and AM secretions - was advised to start on prilosec by ENT/dentist  and has been doing so for 2-3 weeks.   His blood pressure has been controlled at home, today their BP is BP: 130/80 He does not workout - works around his  home - house work, yard work. He denies chest pain, shortness of breath, dizziness.   He is on cholesterol medication and denies myalgias. His LDL cholesterol is at goal. The cholesterol last visit was:   Lab Results  Component Value Date   CHOL 123 02/18/2017   HDL 17 (L) 02/18/2017   LDLCALC 97 10/28/2016   TRIG 177 (H) 02/18/2017   CHOLHDL 7.2 (H) 02/18/2017   He has not been working on diet and exercise for prediabetes, and denies increased appetite, nausea, paresthesia of the feet, polydipsia, polyuria, visual disturbances, vomiting and weight loss. Last A1C in the office was:  Lab Results  Component Value Date   HGBA1C 5.8 (H) 02/18/2017   Last GFR Lab Results  Component Value Date   GFRNONAA 77 02/18/2017   Patient is on Vitamin D supplement and at goal at recent check:    Lab Results  Component Value Date   VD25OH 79 02/18/2017      Medication Review: Current Outpatient Medications on File Prior to Visit  Medication Sig Dispense Refill  . acetaminophen (TYLENOL) 500 MG tablet Take 500 mg by mouth every 6 (six) hours as needed for mild pain.     Marland Kitchen allopurinol (ZYLOPRIM) 300 MG tablet TAKE 1 TABLET EVERY DAY 90 tablet 3  . aspirin EC 81 MG tablet Take 81 mg by mouth daily.    Marland Kitchen atorvastatin (LIPITOR) 40 MG tablet TAKE 1 TABLET EVERY DAY 90 tablet 1  . Black Pepper-Turmeric (TURMERIC COMPLEX/BLACK PEPPER PO) Take 900 mg by mouth daily.     . Cholecalciferol (VITAMIN D PO) Take 15,000 Units by mouth daily.     . diphenhydrAMINE (BENADRYL) 25 MG tablet Take 25 mg by mouth at bedtime.    . docusate sodium (COLACE) 100 MG capsule Take 100 mg by mouth 2 (two) times daily. 1 tablet in the morning and 1 tablet at bedtime    . doxazosin (CARDURA) 4 MG tablet TAKE 1 TABLET EVERY DAY 90 tablet 3  . finasteride (PROSCAR) 5 MG tablet TAKE 1 TABLET EVERY DAY 90 tablet 3  . fluticasone (FLONASE) 50 MCG/ACT nasal spray Place 2 sprays into both nostrils at bedtime. PRN 16 g 2  .  furosemide (LASIX) 40 MG tablet TAKE 1 TABLET (40 MG TOTAL) BY MOUTH DAILY. 90 tablet 1  . HYDROcodone-acetaminophen (NORCO/VICODIN) 5-325 MG tablet Take 1-2 tablets by mouth every 6 (six) hours as needed for moderate pain. 24 tablet 0  . levothyroxine (SYNTHROID, LEVOTHROID) 200 MCG tablet TAKE 1 TABLET EVERY DAY BEFORE BREAKFAST 90 tablet 3  . losartan (COZAAR) 50 MG tablet Take 1 tablet (50 mg total) by mouth daily. 90 tablet 3  . Magnesium 400 MG TABS Take 1 tablet by mouth daily.    . metoprolol succinate (TOPROL-XL) 50 MG 24 hr tablet Take 1 tablet (50 mg total) by mouth daily. Take with or immediately following a meal. 90 tablet 3  . omeprazole (PRILOSEC) 20 MG capsule Take 1 capsule (20 mg total) by mouth 2 (two) times daily. 180 capsule 3  . OVER THE COUNTER MEDICATION daily. Vitamin A to Z 50 plus    . polyethylene glycol (MIRALAX / GLYCOLAX) packet Take 17 g by mouth daily.    . silver sulfADIAZINE (SILVADENE) 1 % cream Apply 1 application  topically daily. 50 g 2  . spironolactone (ALDACTONE) 25 MG tablet TAKE 1 TABLET EVERY DAY 90 tablet 1  . warfarin (COUMADIN) 5 MG tablet TAKE 1 AND 1/2 TABLETS EVERY DAY  OR AS DIRECTED 145 tablet 1   No current facility-administered medications on file prior to visit.     Allergies: Allergies  Allergen Reactions  . Other Swelling    SODIUM SENSITIVE    Current Problems (verified) has HTN (hypertension); Mixed hyperlipidemia; Prediabetes; Vitamin D deficiency; GERD ; BPH (benign prostatic hyperplasia); Medication management; Atrial fibrillation ([place]); Hypothyroidism; OSA (obstructive sleep apnea); Morbid obesity ([place]); ASHD/CABG x 3V (11/2013); Encounter for monitoring Coumadin therapy; and Cardiac pacemaker/AICD (01/2014) on their problem list.  Screening Tests Immunization History  Administered Date(s) Administered  . Influenza, High Dose Seasonal PF 04/23/2015, 02/18/2017  . Influenza,inj,Quad PF,6+ Mos 01/31/2016  . Pneumococcal  Conjugate-13 11/21/2014   Preventative care: Last colonoscopy: 2008 - Cologuard 2018  Prior vaccinations: TD or Tdap: Remote;  declines Influenza: 2018  Pneumococcal: given today Prevnar13: 2016 Shingles/Zostavax: Zostavax can't recall when  Names of Other Physician/Practitioners you currently use: 1. [facility] here for primary care 2. [name] [name], eye doctor, last visit  2018 3. , dentist, last visit 05/2017  Patient Care Team: [name], MD as PCP - General (Internal Medicine)  Surgical: He  has a past surgical history that includes Coronary artery bypass graft (11/2013); lumb laminectomy (4665,9935,7017); Lumbar fusion (2013); Cardiac defibrillator placement (07/2014); Cataract extraction w/ intraocular lens implant (Left); Nasal septoplasty w/ turbinoplasty (Bilateral, 01/30/2016); and Uvulectomy (N/A, 01/30/2016). Family His family history includes Alcohol abuse in his son; Alzheimer's disease in his mother; Depression in his mother; Heart disease (age of onset: 36) in his father; Mental illness in his mother; Stroke in his maternal grandmother. Social history  He reports that he quit smoking about 34 years ago. His smoking use included cigarettes. He has a 50.00 pack-year smoking history. he has never used smokeless tobacco. He reports that he drinks alcohol. He reports that he does not use drugs.  MEDICARE WELLNESS OBJECTIVES: Physical activity: Current Exercise Habits: The patient does not participate in regular exercise at present(Housework and yardwork), Exercise limited by: None identified Cardiac risk factors: Cardiac Risk Factors include: advanced age (>56men, >67 women);dyslipidemia;family history of premature cardiovascular disease;hypertension;male gender;obesity (BMI >30kg/m2);smoking/ tobacco exposure Depression/mood screen:   Depression screen [facility] 2/9 06/01/2017  Decreased Interest 1  Down, Depressed, Hopeless 0  PHQ - 2 Score  1    ADLs:  In your present state of health, do you have any difficulty performing the following activities: 06/01/2017 02/18/2017  Hearing? N N  Vision? N N  Difficulty concentrating or making decisions? N N  Walking or climbing stairs? N N  Dressing or bathing? N N  Doing errands, shopping? N N  Some recent data might be hidden     Cognitive Testing  Alert? Yes  Normal Appearance?Yes  Oriented to person? Yes  Place? Yes   Time? Yes  Recall of three objects?  Yes  Can perform simple calculations? Yes  Displays appropriate judgment?Yes  Can read the correct time from a watch face?Yes  EOL planning: Does Patient Have a Medical Advance Directive?: No Would patient like information on creating a medical advance directive?: No - Patient declined   Objective:   Today's Vitals   06/01/17 1124  BP: 130/80  Pulse: 73  Temp: 97.7 F (36.5 C)  SpO2: 97%  Weight: 268 lb (121.6 kg)  Height: 5' 9.5" (1.765 m)   Body mass index is 39.01 kg/m.  General appearance: alert, obese, no distress, WD/WN, male HEENT: normocephalic, sclerae anicteric, TMs pearly, nares patent, no discharge or erythema, pharynx normal Oral cavity: MMM, no lesions Neck: supple, no lymphadenopathy, no thyromegaly, no masses Heart: RRR, normal S1, S2, no murmurs Lungs: Present throughout, some expiratory wheezing to R lower, LLL somewhat diminished with faint expiratory wheeze. No rhonchi, or rales Abdomen: +bs, soft, non tender, non distended, no masses, no hepatomegaly, no splenomegaly Musculoskeletal: nontender, no swelling, no obvious deformity Extremities: no edema, no cyanosis, no clubbing Pulses: 2+ symmetric, upper and lower extremities, normal cap refill Neurological: alert, oriented x 3, CN2-12 intact, strength normal upper extremities and lower extremities, sensation normal throughout, DTRs 2+ throughout, no cerebellar signs, gait normal Psychiatric: normal affect, behavior normal, pleasant    Medicare Attestation I have personally reviewed: The patient's medical and social history Their use of alcohol, tobacco or illicit drugs Their current medications and supplements The patient's functional ability including ADLs,fall risks, home safety risks, cognitive, and hearing and visual impairment Diet and physical activities Evidence for depression or mood disorders  The patient's weight, height, BMI, and visual acuity have been recorded in the chart.  I have made referrals, counseling, and provided education to the patient based on review of the above and I have provided the patient with a written personalized care plan for preventive services.     Izora Ribas, NP   06/01/2017

## 2017-06-01 ENCOUNTER — Ambulatory Visit (INDEPENDENT_AMBULATORY_CARE_PROVIDER_SITE_OTHER): Payer: Medicare Other | Admitting: Adult Health

## 2017-06-01 ENCOUNTER — Encounter: Payer: Self-pay | Admitting: Adult Health

## 2017-06-01 ENCOUNTER — Ambulatory Visit (HOSPITAL_COMMUNITY)
Admission: RE | Admit: 2017-06-01 | Discharge: 2017-06-01 | Disposition: A | Discharge status: Home / Self Care | Payer: Medicare Other | Source: Ambulatory Visit | Attending: Adult Health | Admitting: Adult Health

## 2017-06-01 VITALS — BP 130/80 | HR 73 | Temp 97.7°F | Ht 69.5 in | Wt 268.0 lb

## 2017-06-01 DIAGNOSIS — E559 Vitamin D deficiency, unspecified: Secondary | ICD-10-CM | POA: Diagnosis not present

## 2017-06-01 DIAGNOSIS — R062 Wheezing: Secondary | ICD-10-CM

## 2017-06-01 DIAGNOSIS — R6889 Other general symptoms and signs: Secondary | ICD-10-CM

## 2017-06-01 DIAGNOSIS — E039 Hypothyroidism, unspecified: Secondary | ICD-10-CM | POA: Diagnosis not present

## 2017-06-01 DIAGNOSIS — E782 Mixed hyperlipidemia: Secondary | ICD-10-CM

## 2017-06-01 DIAGNOSIS — I2510 Atherosclerotic heart disease of native coronary artery without angina pectoris: Secondary | ICD-10-CM

## 2017-06-01 DIAGNOSIS — R0602 Shortness of breath: Secondary | ICD-10-CM | POA: Insufficient documentation

## 2017-06-01 DIAGNOSIS — J9811 Atelectasis: Secondary | ICD-10-CM | POA: Diagnosis not present

## 2017-06-01 DIAGNOSIS — K219 Gastro-esophageal reflux disease without esophagitis: Secondary | ICD-10-CM

## 2017-06-01 DIAGNOSIS — Z951 Presence of aortocoronary bypass graft: Secondary | ICD-10-CM | POA: Insufficient documentation

## 2017-06-01 DIAGNOSIS — G4733 Obstructive sleep apnea (adult) (pediatric): Secondary | ICD-10-CM | POA: Diagnosis not present

## 2017-06-01 DIAGNOSIS — Z0001 Encounter for general adult medical examination with abnormal findings: Secondary | ICD-10-CM

## 2017-06-01 DIAGNOSIS — R05 Cough: Secondary | ICD-10-CM | POA: Diagnosis not present

## 2017-06-01 DIAGNOSIS — Z950 Presence of cardiac pacemaker: Secondary | ICD-10-CM

## 2017-06-01 DIAGNOSIS — Z79899 Other long term (current) drug therapy: Secondary | ICD-10-CM

## 2017-06-01 DIAGNOSIS — I480 Paroxysmal atrial fibrillation: Secondary | ICD-10-CM

## 2017-06-01 DIAGNOSIS — N401 Enlarged prostate with lower urinary tract symptoms: Secondary | ICD-10-CM

## 2017-06-01 DIAGNOSIS — Z0000 Encounter for general adult medical examination without abnormal findings: Secondary | ICD-10-CM

## 2017-06-01 DIAGNOSIS — Z23 Encounter for immunization: Secondary | ICD-10-CM | POA: Diagnosis not present

## 2017-06-01 DIAGNOSIS — R7303 Prediabetes: Secondary | ICD-10-CM

## 2017-06-01 DIAGNOSIS — Z87891 Personal history of nicotine dependence: Secondary | ICD-10-CM | POA: Insufficient documentation

## 2017-06-01 DIAGNOSIS — I771 Stricture of artery: Secondary | ICD-10-CM | POA: Insufficient documentation

## 2017-06-01 DIAGNOSIS — I10 Essential (primary) hypertension: Secondary | ICD-10-CM | POA: Diagnosis not present

## 2017-06-01 DIAGNOSIS — J439 Emphysema, unspecified: Secondary | ICD-10-CM | POA: Insufficient documentation

## 2017-06-01 MED ORDER — ALBUTEROL SULFATE HFA 108 (90 BASE) MCG/ACT IN AERS: 2.0000 | INHALATION_SPRAY | Freq: Four times a day (QID) | RESPIRATORY_TRACT | 3 refills | Status: DC | PRN

## 2017-06-01 MED ORDER — RANITIDINE HCL 150 MG PO TABS: 150.0000 mg | ORAL_TABLET | Freq: Two times a day (BID) | ORAL | 1 refills | Status: DC

## 2017-06-01 NOTE — Addendum Note (Signed)
Addended by: Chancy Hurter on: 06/01/2017 02:17 PM   Modules accepted: Orders

## 2017-06-01 NOTE — Patient Instructions (Signed)
Start ranitidine 150 mg twice a day for acid reflux/ coughing  Start on a daily allergy medication (claritin, allegra, zyrtec) daily for a few weeks and see if this improves symptoms    Gastroesophageal Reflux Disease, Adult Normally, food travels down the esophagus and stays in the stomach to be digested. If a person has gastroesophageal reflux disease (GERD), food and stomach acid move back up into the esophagus. When this happens, the esophagus becomes sore and swollen (inflamed). Over time, GERD can make small holes (ulcers) in the lining of the esophagus. Follow these instructions at home: Diet  Follow a diet as told by your doctor. You may need to avoid foods and drinks such as: ? Coffee and tea (with or without caffeine). ? Drinks that contain alcohol. ? Energy drinks and sports drinks. ? Carbonated drinks or sodas. ? Chocolate and cocoa. ? Peppermint and mint flavorings. ? Garlic and onions. ? Horseradish. ? Spicy and acidic foods, such as peppers, chili powder, curry powder, vinegar, hot sauces, and BBQ sauce. ? Citrus fruit juices and citrus fruits, such as oranges, lemons, and limes. ? Tomato-based foods, such as red sauce, chili, salsa, and pizza with red sauce. ? Fried and fatty foods, such as donuts, french fries, potato chips, and high-fat dressings. ? High-fat meats, such as hot dogs, rib eye steak, sausage, ham, and bacon. ? High-fat dairy items, such as whole milk, butter, and cream cheese.  Eat small meals often. Avoid eating large meals.  Avoid drinking large amounts of liquid with your meals.  Avoid eating meals during the 2-3 hours before bedtime.  Avoid lying down right after you eat.  Do not exercise right after you eat. General instructions  Pay attention to any changes in your symptoms.  Take over-the-counter and prescription medicines only as told by your doctor. Do not take aspirin, ibuprofen, or other NSAIDs unless your doctor says it is  okay.  Do not use any tobacco products, including cigarettes, chewing tobacco, and e-cigarettes. If you need help quitting, ask your doctor.  Wear loose clothes. Do not wear anything tight around your waist.  Raise (elevate) the head of your bed about 6 inches (15 cm).  Try to lower your stress. If you need help doing this, ask your doctor.  If you are overweight, lose an amount of weight that is healthy for you. Ask your doctor about a safe weight loss goal.  Keep all follow-up visits as told by your doctor. This is important. Contact a doctor if:  You have new symptoms.  You lose weight and you do not know why it is happening.  You have trouble swallowing, or it hurts to swallow.  You have wheezing or a cough that keeps happening.  Your symptoms do not get better with treatment.  You have a hoarse voice. Get help right away if:  You have pain in your arms, neck, jaw, teeth, or back.  You feel sweaty, dizzy, or light-headed.  You have chest pain or shortness of breath.  You throw up (vomit) and your throw up looks like blood or coffee grounds.  You pass out (faint).  Your poop (stool) is bloody or black.  You cannot swallow, drink, or eat. This information is not intended to replace advice given to you by your health care provider. Make sure you discuss any questions you have with your health care provider. Document Released: 10/22/2007 Document Revised: 10/11/2015 Document Reviewed: 08/30/2014 Elsevier Interactive Patient Education  [employer].

## 2017-06-02 ENCOUNTER — Encounter: Payer: Self-pay | Admitting: Adult Health

## 2017-06-02 LAB — CBC WITH DIFFERENTIAL/PLATELET
Basophils Absolute: 146 cells/uL (ref 0–200)
Basophils Relative: 1.2 %
Eosinophils Absolute: 195 cells/uL (ref 15–500)
Eosinophils Relative: 1.6 %
HCT: 41.6 % (ref 38.5–50.0)
Hemoglobin: 13.6 g/dL (ref 13.2–17.1)
Lymphs Abs: 1671 cells/uL (ref 850–3900)
MCH: 28 pg (ref 27.0–33.0)
MCHC: 32.7 g/dL (ref 32.0–36.0)
MCV: 85.8 fL (ref 80.0–100.0)
MPV: 11.3 fL (ref 7.5–12.5)
Monocytes Relative: 4.5 %
Neutro Abs: 9638 cells/uL — ABNORMAL HIGH (ref 1500–7800)
Neutrophils Relative %: 79 %
Platelets: 661 10*3/uL — ABNORMAL HIGH (ref 140–400)
RBC: 4.85 10*6/uL (ref 4.20–5.80)
RDW: 18.9 % — ABNORMAL HIGH (ref 11.0–15.0)
Total Lymphocyte: 13.7 %
WBC mixed population: 549 cells/uL (ref 200–950)
WBC: 12.2 10*3/uL — ABNORMAL HIGH (ref 3.8–10.8)

## 2017-06-02 LAB — HEPATIC FUNCTION PANEL
AG Ratio: 2.5 (calc) (ref 1.0–2.5)
ALT: 25 U/L (ref 9–46)
AST: 25 U/L (ref 10–35)
Albumin: 4.8 g/dL (ref 3.6–5.1)
Alkaline phosphatase (APISO): 54 U/L (ref 40–115)
Bilirubin, Direct: 0.2 mg/dL (ref 0.0–0.2)
Globulin: 1.9 g/dL (calc) (ref 1.9–3.7)
Indirect Bilirubin: 0.7 mg/dL (calc) (ref 0.2–1.2)
Total Bilirubin: 0.9 mg/dL (ref 0.2–1.2)
Total Protein: 6.7 g/dL (ref 6.1–8.1)

## 2017-06-02 LAB — BASIC METABOLIC PANEL WITH GFR
BUN: 21 mg/dL (ref 7–25)
CALCIUM: 9.9 mg/dL (ref 8.6–10.3)
CHLORIDE: 99 mmol/L (ref 98–110)
CO2: 32 mmol/L (ref 20–32)
Creat: 1.13 mg/dL (ref 0.70–1.18)
GFR, EST NON AFRICAN AMERICAN: 63 mL/min/{1.73_m2} (ref >=60)
GFR, Est African American: 73 mL/min/{1.73_m2} (ref >=60)
Glucose, Bld: 95 mg/dL (ref 65–99)
POTASSIUM: 4.9 mmol/L (ref 3.5–5.3)
Sodium: 137 mmol/L (ref 135–146)

## 2017-06-02 LAB — LIPID PANEL
CHOLESTEROL: 123 mg/dL (ref <200)
HDL: 17 mg/dL — AB (ref >40)
LDL CHOLESTEROL (CALC): 79 mg/dL
Non-HDL Cholesterol (Calc): 106 mg/dL (calc) (ref <130)
TRIGLYCERIDES: 177 mg/dL — AB (ref <150)
Total CHOL/HDL Ratio: 7.2 (calc) — ABNORMAL HIGH (ref <5.0)

## 2017-06-02 LAB — HEMOGLOBIN A1C
EAG (MMOL/L): 7 (calc)
Hgb A1c MFr Bld: 6 % of total Hgb — ABNORMAL HIGH (ref <5.7)
Mean Plasma Glucose: 126 (calc)

## 2017-06-02 LAB — TSH: TSH: 3.08 mIU/L (ref 0.40–4.50)

## 2017-06-17 DIAGNOSIS — Z7901 Long term (current) use of anticoagulants: Secondary | ICD-10-CM | POA: Diagnosis not present

## 2017-06-17 DIAGNOSIS — Z95810 Presence of automatic (implantable) cardiac defibrillator: Secondary | ICD-10-CM | POA: Diagnosis not present

## 2017-06-17 DIAGNOSIS — I255 Ischemic cardiomyopathy: Secondary | ICD-10-CM | POA: Diagnosis not present

## 2017-06-17 DIAGNOSIS — I480 Paroxysmal atrial fibrillation: Secondary | ICD-10-CM | POA: Diagnosis not present

## 2017-07-15 DIAGNOSIS — I255 Ischemic cardiomyopathy: Secondary | ICD-10-CM | POA: Diagnosis not present

## 2017-07-15 DIAGNOSIS — Z7901 Long term (current) use of anticoagulants: Secondary | ICD-10-CM | POA: Diagnosis not present

## 2017-07-15 DIAGNOSIS — Z95810 Presence of automatic (implantable) cardiac defibrillator: Secondary | ICD-10-CM | POA: Diagnosis not present

## 2017-07-15 DIAGNOSIS — I480 Paroxysmal atrial fibrillation: Secondary | ICD-10-CM | POA: Diagnosis not present

## 2017-07-22 DIAGNOSIS — I4891 Unspecified atrial fibrillation: Secondary | ICD-10-CM | POA: Diagnosis not present

## 2017-07-22 DIAGNOSIS — Z4502 Encounter for adjustment and management of automatic implantable cardiac defibrillator: Secondary | ICD-10-CM | POA: Diagnosis not present

## 2017-07-24 ENCOUNTER — Other Ambulatory Visit: Payer: Self-pay | Admitting: Internal Medicine

## 2017-07-31 ENCOUNTER — Other Ambulatory Visit: Payer: Self-pay | Admitting: Internal Medicine

## 2017-08-05 ENCOUNTER — Encounter: Payer: Self-pay | Admitting: Internal Medicine

## 2017-08-19 DIAGNOSIS — Z7901 Long term (current) use of anticoagulants: Secondary | ICD-10-CM | POA: Diagnosis not present

## 2017-08-19 DIAGNOSIS — I255 Ischemic cardiomyopathy: Secondary | ICD-10-CM | POA: Diagnosis not present

## 2017-08-19 DIAGNOSIS — I480 Paroxysmal atrial fibrillation: Secondary | ICD-10-CM | POA: Diagnosis not present

## 2017-08-19 DIAGNOSIS — Z95810 Presence of automatic (implantable) cardiac defibrillator: Secondary | ICD-10-CM | POA: Diagnosis not present

## 2017-09-01 ENCOUNTER — Ambulatory Visit (INDEPENDENT_AMBULATORY_CARE_PROVIDER_SITE_OTHER): Payer: Medicare Other | Admitting: Physician Assistant

## 2017-09-01 VITALS — BP 130/72 | HR 76 | Temp 97.5°F | Resp 18 | Ht 69.5 in | Wt 262.5 lb

## 2017-09-01 DIAGNOSIS — E782 Mixed hyperlipidemia: Secondary | ICD-10-CM | POA: Diagnosis not present

## 2017-09-01 DIAGNOSIS — E559 Vitamin D deficiency, unspecified: Secondary | ICD-10-CM

## 2017-09-01 DIAGNOSIS — D473 Essential (hemorrhagic) thrombocythemia: Secondary | ICD-10-CM | POA: Diagnosis not present

## 2017-09-01 DIAGNOSIS — R7309 Other abnormal glucose: Secondary | ICD-10-CM | POA: Diagnosis not present

## 2017-09-01 DIAGNOSIS — E039 Hypothyroidism, unspecified: Secondary | ICD-10-CM

## 2017-09-01 DIAGNOSIS — D692 Other nonthrombocytopenic purpura: Secondary | ICD-10-CM | POA: Insufficient documentation

## 2017-09-01 DIAGNOSIS — D75839 Thrombocytosis, unspecified: Secondary | ICD-10-CM

## 2017-09-01 DIAGNOSIS — I10 Essential (primary) hypertension: Secondary | ICD-10-CM

## 2017-09-01 DIAGNOSIS — R7303 Prediabetes: Secondary | ICD-10-CM

## 2017-09-01 DIAGNOSIS — J439 Emphysema, unspecified: Secondary | ICD-10-CM

## 2017-09-01 DIAGNOSIS — I2510 Atherosclerotic heart disease of native coronary artery without angina pectoris: Secondary | ICD-10-CM

## 2017-09-01 DIAGNOSIS — Z79899 Other long term (current) drug therapy: Secondary | ICD-10-CM | POA: Diagnosis not present

## 2017-09-01 DIAGNOSIS — I480 Paroxysmal atrial fibrillation: Secondary | ICD-10-CM

## 2017-09-01 NOTE — Progress Notes (Signed)
Assessment and Plan:   Essential hypertension - continue medications, DASH diet, exercise and monitor at home. Call if greater than 130/80.  -     CBC with Differential/Platelet -     BASIC METABOLIC PANEL WITH GFR -     Magnesium -     TSH  Hyperlipemia, mixed -continue medications, check lipids, decrease fatty foods, increase activity.  -     Hepatic function panel -     Lipid panel -     TSH  Abnormal glucose -     Hemoglobin A1c -     Insulin, random  Vitamin D deficiency -     VITAMIN D 25 Hydroxy (Vit-D Deficiency, Fractures)  Hypothyroidism, unspecified type Hypothyroidism-check TSH level, continue medications the same, reminded to take on an empty stomach 30-72mins before food.  -     TSH  ASHD/CABG x 3V (11/2013) Control blood pressure, cholesterol, glucose, increase exercise.  -     Lipid panel  Paroxysmal atrial fibrillation (HCC) Continue cardio follow up -     TSH  Medication management -     CBC with Differential/Platelet -     BASIC METABOLIC PANEL WITH GFR -     Hepatic function panel -     Magnesium -     Lipid panel -     TSH -     Hemoglobin A1c -     Insulin, random -     VITAMIN D 25 Hydroxy (Vit-D Deficiency, Fractures)  Senile purpura (HCC) Discussed process, protect skin, sunscreen  Pulmonary emphysema, unspecified emphysema type (HCC) No triggers, well controlled symptoms, cont to monitor  Thrombocytosis (HCC) Check CBC  Morbid obesity ([place]) - follow up 3 months for progress monitoring - increase veggies, decrease carbs - long discussion about weight loss, diet, and exercise   Continue diet and meds as discussed. Further disposition pending results of labs. Discussed med's effects and SE's.   Future Appointments  Date Time Provider [place]  03/22/2018  3:00 PM [name], MD [facility] None    HPI 77 y.o. male  presents for 3 month follow up with hypertension, hyperlipidemia, diabetes and vitamin D  deficiency.   His blood pressure has been controlled at home, today their BP is BP: 130/72.He does not workout. He denies chest pain, shortness of breath, dizziness.   He has history of CABG x 3 with afib, failed VCV, has had bivent AICD- follows with [facility].     He is on cholesterol medication and denies myalgias. His cholesterol is at goal. The cholesterol was:  06/01/2017: Cholesterol 123; HDL 17; LDL Cholesterol (Calc) 79; Triglycerides 177.  He does note that sometimes he does have some muscle cramping in his legs.  He reports that moving them helps to get rid of this.     He has been working on diet and exercise for diabetes with diabetic chronic kidney disease, he is on bASA, he is on ACE/ARB, and denies  foot ulcerations, hyperglycemia, hypoglycemia , increased appetite, nausea, paresthesia of the feet, polydipsia, polyuria, visual disturbances, vomiting and weight loss. Last A1C was: 06/01/2017: Hgb A1c MFr Bld 6.0.   [name] 02/18/2017: Vit D, 25-Hydroxy 79  BMI is Body mass index is 38.21 kg/m., he is working on diet and exercise. Wt Readings from Last 3 Encounters:  09/01/17 262 lb 8 oz (119.1 kg)  06/01/17 268 lb (121.6 kg)  02/18/17 268 lb 12.8 oz (121.9 kg)  Current Medications:  Current Outpatient Medications on File Prior to Visit  Medication Sig Dispense Refill  . acetaminophen (TYLENOL) 500 MG tablet Take 500 mg by mouth every 6 (six) hours as needed for mild pain.     Marland Kitchen albuterol (PROVENTIL HFA;VENTOLIN HFA) 108 (90 Base) MCG/ACT inhaler Inhale 2 puffs into the lungs every 6 (six) hours as needed for wheezing or shortness of breath. 1 Inhaler 3  . allopurinol (ZYLOPRIM) 300 MG tablet TAKE 1 TABLET EVERY DAY 90 tablet 3  . aspirin EC 81 MG tablet Take 81 mg by mouth daily.    Marland Kitchen atorvastatin (LIPITOR) 40 MG tablet TAKE 1 TABLET EVERY DAY 90 tablet 1  . Black Pepper-Turmeric (TURMERIC COMPLEX/BLACK PEPPER PO) Take 900 mg  by mouth daily.     . Cholecalciferol (VITAMIN D PO) Take 15,000 Units by mouth daily.     . diphenhydrAMINE (BENADRYL) 25 MG tablet Take 25 mg by mouth at bedtime.    . docusate sodium (COLACE) 100 MG capsule Take 100 mg by mouth 2 (two) times daily. 1 tablet in the morning and 1 tablet at bedtime    . doxazosin (CARDURA) 4 MG tablet TAKE 1 TABLET EVERY DAY 90 tablet 3  . finasteride (PROSCAR) 5 MG tablet TAKE 1 TABLET EVERY DAY 90 tablet 3  . fluticasone (FLONASE) 50 MCG/ACT nasal spray Place 2 sprays into both nostrils at bedtime. PRN 16 g 2  . furosemide (LASIX) 40 MG tablet TAKE 1 TABLET (40 MG TOTAL) BY MOUTH DAILY. 90 tablet 1  . levothyroxine (SYNTHROID, LEVOTHROID) 200 MCG tablet TAKE 1 TABLET EVERY DAY BEFORE BREAKFAST 90 tablet 3  . losartan (COZAAR) 50 MG tablet Take 1 tablet (50 mg total) by mouth daily. 90 tablet 3  . Magnesium 400 MG TABS Take 1 tablet by mouth daily.    . metoprolol succinate (TOPROL-XL) 50 MG 24 hr tablet TAKE 1 TABLET EVERY DAY WITH FOOD  OR  IMMEDIATELY FOLLOWING A MEAL 90 tablet 3  . omeprazole (PRILOSEC) 20 MG capsule TAKE 1 CAPSULE TWICE DAILY 180 capsule 3  . OVER THE COUNTER MEDICATION daily. Vitamin A to Z 50 plus    . polyethylene glycol (MIRALAX / GLYCOLAX) packet Take 17 g by mouth daily.    . ranitidine (ZANTAC) 150 MG tablet Take 1 tablet (150 mg total) by mouth 2 (two) times daily. 180 tablet 1  . silver sulfADIAZINE (SILVADENE) 1 % cream Apply 1 application topically daily. 50 g 2  . spironolactone (ALDACTONE) 25 MG tablet TAKE 1 TABLET EVERY DAY 90 tablet 1  . warfarin (COUMADIN) 5 MG tablet TAKE 1 AND 1/2 TABLETS EVERY DAY  OR AS DIRECTED 145 tablet 1   No current facility-administered medications on file prior to visit.    Medical History:  Past Medical History:  Diagnosis Date  . A-fib ([place]) 01/2014  . AICD (automatic cardioverter/defibrillator) present    [employer]  . Arthritis   . ASHD (arteriosclerotic heart disease) 2015    s/p CABG  . Cataract    s/p left  . COPD (chronic obstructive pulmonary disease) ([place])    by CXR, pt unsure of this  . GERD (gastroesophageal reflux disease)   . Hyperlipidemia   . Hypertension   . Hypothyroidism   . LBBB (left bundle branch block)   . Pre-diabetes   . Prediabetes 01/2014  . Presence of permanent cardiac pacemaker   . Sleep apnea    no CPAP  . Thyroid disease  hypothyroid   Allergies:  Allergies  Allergen Reactions  . Other Swelling    SODIUM SENSITIVE     Review of Systems:  Review of Systems  Constitutional: Negative for chills, fever and malaise/fatigue.  HENT: Negative for congestion, ear pain and sore throat.   Eyes: Negative.   Respiratory: Negative for cough, shortness of breath and wheezing.   Cardiovascular: Negative for chest pain, palpitations, leg swelling and PND.  Gastrointestinal: Positive for constipation. Negative for abdominal pain, blood in stool, diarrhea, heartburn and melena.  Genitourinary: Negative.   Skin: Negative.   Neurological: Negative for dizziness, sensory change, loss of consciousness and headaches.  Psychiatric/Behavioral: Negative for depression. The patient is not nervous/anxious and does not have insomnia.     Family history- Review and unchanged  Social history- Review and unchanged  Physical Exam: BP 130/72   Pulse 76   Temp (!) 97.5 F (36.4 C)   Resp 18   Ht 5' 9.5" (1.765 m)   Wt 262 lb 8 oz (119.1 kg)   BMI 38.21 kg/m  Wt Readings from Last 3 Encounters:  09/01/17 262 lb 8 oz (119.1 kg)  06/01/17 268 lb (121.6 kg)  02/18/17 268 lb 12.8 oz (121.9 kg)   General Appearance: Well nourished well developed, non-toxic appearing, in no apparent distress. Eyes: PERRLA, EOMs, conjunctiva no swelling or erythema ENT/Mouth: Ear canals clear with no erythema, swelling, or discharge.  TMs normal bilaterally, oropharynx clear, moist, with no exudate.   Neck: Supple, thyroid normal, no JVD, no cervical  adenopathy.  Respiratory: Respiratory effort normal, breath sounds clear A&P, no wheeze, rhonchi or rales noted.  No retractions, no accessory muscle usage Cardio: RRR with no MRGs. No noted edema.  Abdomen: Soft, + BS.  Non tender, no guarding, rebound, hernias, masses. Musculoskeletal: Full ROM, 5/5 strength, Normal gait Skin: Warm, dry without rashes, lesions, ecchymosis.  Neuro: Awake and oriented X 3, Cranial nerves intact. No cerebellar symptoms.  Psych: normal affect, Insight and Judgment appropriate.    Vicie Mutters, PA-C 12:12 PM [facility]

## 2017-09-01 NOTE — Patient Instructions (Signed)

## 2017-09-02 LAB — CBC WITH DIFFERENTIAL/PLATELET
Basophils Absolute: 195 cells/uL (ref 0–200)
Basophils Relative: 1.5 %
EOS PCT: 1.8 %
Eosinophils Absolute: 234 cells/uL (ref 15–500)
HCT: 42.3 % (ref 38.5–50.0)
Hemoglobin: 13.8 g/dL (ref 13.2–17.1)
Lymphs Abs: 1521 cells/uL (ref 850–3900)
MCH: 28.4 pg (ref 27.0–33.0)
MCHC: 32.6 g/dL (ref 32.0–36.0)
MCV: 87 fL (ref 80.0–100.0)
MONOS PCT: 4.5 %
MPV: 11 fL (ref 7.5–12.5)
NEUTROS PCT: 80.5 %
Neutro Abs: 10465 cells/uL — ABNORMAL HIGH (ref 1500–7800)
PLATELETS: 677 10*3/uL — AB (ref 140–400)
RBC: 4.86 10*6/uL (ref 4.20–5.80)
RDW: 18.4 % — AB (ref 11.0–15.0)
TOTAL LYMPHOCYTE: 11.7 %
WBC: 13 10*3/uL — AB (ref 3.8–10.8)
WBCMIX: 585 {cells}/uL (ref 200–950)

## 2017-09-02 LAB — TSH: TSH: 4.55 mIU/L — ABNORMAL HIGH (ref 0.40–4.50)

## 2017-09-02 LAB — BASIC METABOLIC PANEL WITH GFR
BUN: 21 mg/dL (ref 7–25)
CALCIUM: 9.6 mg/dL (ref 8.6–10.3)
CHLORIDE: 101 mmol/L (ref 98–110)
CO2: 29 mmol/L (ref 20–32)
Creat: 1.14 mg/dL (ref 0.70–1.18)
GFR, EST AFRICAN AMERICAN: 72 mL/min/{1.73_m2} (ref >=60)
GFR, Est Non African American: 62 mL/min/{1.73_m2} (ref >=60)
GLUCOSE: 129 mg/dL — AB (ref 65–99)
POTASSIUM: 5 mmol/L (ref 3.5–5.3)
Sodium: 137 mmol/L (ref 135–146)

## 2017-09-02 LAB — HEPATIC FUNCTION PANEL
AG Ratio: 2.1 (calc) (ref 1.0–2.5)
ALBUMIN MSPROF: 4.5 g/dL (ref 3.6–5.1)
ALT: 26 U/L (ref 9–46)
AST: 30 U/L (ref 10–35)
Alkaline phosphatase (APISO): 62 U/L (ref 40–115)
BILIRUBIN DIRECT: 0.2 mg/dL (ref 0.0–0.2)
Globulin: 2.1 g/dL (calc) (ref 1.9–3.7)
Indirect Bilirubin: 0.8 mg/dL (calc) (ref 0.2–1.2)
Total Bilirubin: 1 mg/dL (ref 0.2–1.2)
Total Protein: 6.6 g/dL (ref 6.1–8.1)

## 2017-09-02 LAB — LIPID PANEL
CHOL/HDL RATIO: 5.7 (calc) — AB (ref <5.0)
CHOLESTEROL: 113 mg/dL (ref <200)
HDL: 20 mg/dL — AB (ref >40)
LDL Cholesterol (Calc): 70 mg/dL (calc)
Non-HDL Cholesterol (Calc): 93 mg/dL (calc) (ref <130)
Triglycerides: 156 mg/dL — ABNORMAL HIGH (ref <150)

## 2017-09-02 LAB — VITAMIN D 25 HYDROXY (VIT D DEFICIENCY, FRACTURES): VIT D 25 HYDROXY: 81 ng/mL (ref 30–100)

## 2017-09-02 LAB — HEMOGLOBIN A1C
Hgb A1c MFr Bld: 5.9 % of total Hgb — ABNORMAL HIGH (ref <5.7)
Mean Plasma Glucose: 123 (calc)
eAG (mmol/L): 6.8 (calc)

## 2017-09-02 LAB — MAGNESIUM: MAGNESIUM: 1.8 mg/dL (ref 1.5–2.5)

## 2017-09-02 LAB — INSULIN, RANDOM: Insulin: 34.7 u[IU]/mL — ABNORMAL HIGH (ref 2.0–19.6)

## 2017-09-16 ENCOUNTER — Telehealth: Payer: Self-pay | Admitting: Internal Medicine

## 2017-09-16 ENCOUNTER — Telehealth: Payer: Self-pay | Admitting: Physician Assistant

## 2017-09-16 DIAGNOSIS — J439 Emphysema, unspecified: Secondary | ICD-10-CM

## 2017-09-16 MED ORDER — FLUTICASONE FUROATE-VILANTEROL 100-25 MCG/INH IN AEPB: 1.0000 | INHALATION_SPRAY | Freq: Every day | RESPIRATORY_TRACT | 0 refills | Status: DC

## 2017-09-16 MED ORDER — ALBUTEROL SULFATE (2.5 MG/3ML) 0.083% IN NEBU: 2.5000 mg | INHALATION_SOLUTION | Freq: Four times a day (QID) | RESPIRATORY_TRACT | 12 refills | Status: DC | PRN

## 2017-09-16 NOTE — Telephone Encounter (Signed)
Nebulizer DME  faxed to [place].Sent [facility], community message advising. Faxing Albuterol to local pharmacy, [place].

## 2017-09-16 NOTE — Telephone Encounter (Signed)
Patient with COPD, worsening COPD, will give samples of breo and we will set up for nebulizer that he can do up to 2 x a day. He . has failed albuterol inhaler due to poor coordination and FEV and would benefit from a nebulizer.

## 2017-09-23 DIAGNOSIS — I480 Paroxysmal atrial fibrillation: Secondary | ICD-10-CM | POA: Diagnosis not present

## 2017-09-23 DIAGNOSIS — Z7901 Long term (current) use of anticoagulants: Secondary | ICD-10-CM | POA: Diagnosis not present

## 2017-09-23 DIAGNOSIS — Z95810 Presence of automatic (implantable) cardiac defibrillator: Secondary | ICD-10-CM | POA: Diagnosis not present

## 2017-09-23 DIAGNOSIS — I255 Ischemic cardiomyopathy: Secondary | ICD-10-CM | POA: Diagnosis not present

## 2017-10-05 DIAGNOSIS — M9902 Segmental and somatic dysfunction of thoracic region: Secondary | ICD-10-CM | POA: Diagnosis not present

## 2017-10-05 DIAGNOSIS — M9901 Segmental and somatic dysfunction of cervical region: Secondary | ICD-10-CM | POA: Diagnosis not present

## 2017-10-05 DIAGNOSIS — M4723 Other spondylosis with radiculopathy, cervicothoracic region: Secondary | ICD-10-CM | POA: Diagnosis not present

## 2017-10-05 DIAGNOSIS — M47814 Spondylosis without myelopathy or radiculopathy, thoracic region: Secondary | ICD-10-CM | POA: Diagnosis not present

## 2017-10-06 DIAGNOSIS — M9901 Segmental and somatic dysfunction of cervical region: Secondary | ICD-10-CM | POA: Diagnosis not present

## 2017-10-06 DIAGNOSIS — M4723 Other spondylosis with radiculopathy, cervicothoracic region: Secondary | ICD-10-CM | POA: Diagnosis not present

## 2017-10-06 DIAGNOSIS — M47814 Spondylosis without myelopathy or radiculopathy, thoracic region: Secondary | ICD-10-CM | POA: Diagnosis not present

## 2017-10-06 DIAGNOSIS — M9902 Segmental and somatic dysfunction of thoracic region: Secondary | ICD-10-CM | POA: Diagnosis not present

## 2017-10-13 DIAGNOSIS — M47814 Spondylosis without myelopathy or radiculopathy, thoracic region: Secondary | ICD-10-CM | POA: Diagnosis not present

## 2017-10-13 DIAGNOSIS — M9901 Segmental and somatic dysfunction of cervical region: Secondary | ICD-10-CM | POA: Diagnosis not present

## 2017-10-13 DIAGNOSIS — M9902 Segmental and somatic dysfunction of thoracic region: Secondary | ICD-10-CM | POA: Diagnosis not present

## 2017-10-13 DIAGNOSIS — M4723 Other spondylosis with radiculopathy, cervicothoracic region: Secondary | ICD-10-CM | POA: Diagnosis not present

## 2017-10-14 DIAGNOSIS — M9901 Segmental and somatic dysfunction of cervical region: Secondary | ICD-10-CM | POA: Diagnosis not present

## 2017-10-14 DIAGNOSIS — M9902 Segmental and somatic dysfunction of thoracic region: Secondary | ICD-10-CM | POA: Diagnosis not present

## 2017-10-14 DIAGNOSIS — Z961 Presence of intraocular lens: Secondary | ICD-10-CM | POA: Diagnosis not present

## 2017-10-14 DIAGNOSIS — H26493 Other secondary cataract, bilateral: Secondary | ICD-10-CM | POA: Diagnosis not present

## 2017-10-14 DIAGNOSIS — M4723 Other spondylosis with radiculopathy, cervicothoracic region: Secondary | ICD-10-CM | POA: Diagnosis not present

## 2017-10-14 DIAGNOSIS — M47814 Spondylosis without myelopathy or radiculopathy, thoracic region: Secondary | ICD-10-CM | POA: Diagnosis not present

## 2017-10-14 DIAGNOSIS — H527 Unspecified disorder of refraction: Secondary | ICD-10-CM | POA: Diagnosis not present

## 2017-10-15 DIAGNOSIS — M4723 Other spondylosis with radiculopathy, cervicothoracic region: Secondary | ICD-10-CM | POA: Diagnosis not present

## 2017-10-15 DIAGNOSIS — M9901 Segmental and somatic dysfunction of cervical region: Secondary | ICD-10-CM | POA: Diagnosis not present

## 2017-10-15 DIAGNOSIS — M47814 Spondylosis without myelopathy or radiculopathy, thoracic region: Secondary | ICD-10-CM | POA: Diagnosis not present

## 2017-10-15 DIAGNOSIS — M9902 Segmental and somatic dysfunction of thoracic region: Secondary | ICD-10-CM | POA: Diagnosis not present

## 2017-10-19 DIAGNOSIS — M9902 Segmental and somatic dysfunction of thoracic region: Secondary | ICD-10-CM | POA: Diagnosis not present

## 2017-10-19 DIAGNOSIS — M9901 Segmental and somatic dysfunction of cervical region: Secondary | ICD-10-CM | POA: Diagnosis not present

## 2017-10-19 DIAGNOSIS — M47814 Spondylosis without myelopathy or radiculopathy, thoracic region: Secondary | ICD-10-CM | POA: Diagnosis not present

## 2017-10-19 DIAGNOSIS — M4723 Other spondylosis with radiculopathy, cervicothoracic region: Secondary | ICD-10-CM | POA: Diagnosis not present

## 2017-10-20 DIAGNOSIS — M9902 Segmental and somatic dysfunction of thoracic region: Secondary | ICD-10-CM | POA: Diagnosis not present

## 2017-10-20 DIAGNOSIS — M47814 Spondylosis without myelopathy or radiculopathy, thoracic region: Secondary | ICD-10-CM | POA: Diagnosis not present

## 2017-10-20 DIAGNOSIS — M4723 Other spondylosis with radiculopathy, cervicothoracic region: Secondary | ICD-10-CM | POA: Diagnosis not present

## 2017-10-20 DIAGNOSIS — M9901 Segmental and somatic dysfunction of cervical region: Secondary | ICD-10-CM | POA: Diagnosis not present

## 2017-10-21 DIAGNOSIS — I4891 Unspecified atrial fibrillation: Secondary | ICD-10-CM | POA: Diagnosis not present

## 2017-10-22 DIAGNOSIS — M9902 Segmental and somatic dysfunction of thoracic region: Secondary | ICD-10-CM | POA: Diagnosis not present

## 2017-10-22 DIAGNOSIS — M4723 Other spondylosis with radiculopathy, cervicothoracic region: Secondary | ICD-10-CM | POA: Diagnosis not present

## 2017-10-22 DIAGNOSIS — M47814 Spondylosis without myelopathy or radiculopathy, thoracic region: Secondary | ICD-10-CM | POA: Diagnosis not present

## 2017-10-22 DIAGNOSIS — M9901 Segmental and somatic dysfunction of cervical region: Secondary | ICD-10-CM | POA: Diagnosis not present

## 2017-10-26 DIAGNOSIS — M47814 Spondylosis without myelopathy or radiculopathy, thoracic region: Secondary | ICD-10-CM | POA: Diagnosis not present

## 2017-10-26 DIAGNOSIS — M9901 Segmental and somatic dysfunction of cervical region: Secondary | ICD-10-CM | POA: Diagnosis not present

## 2017-10-26 DIAGNOSIS — M9902 Segmental and somatic dysfunction of thoracic region: Secondary | ICD-10-CM | POA: Diagnosis not present

## 2017-10-26 DIAGNOSIS — M4723 Other spondylosis with radiculopathy, cervicothoracic region: Secondary | ICD-10-CM | POA: Diagnosis not present

## 2017-10-27 DIAGNOSIS — M9902 Segmental and somatic dysfunction of thoracic region: Secondary | ICD-10-CM | POA: Diagnosis not present

## 2017-10-27 DIAGNOSIS — M4723 Other spondylosis with radiculopathy, cervicothoracic region: Secondary | ICD-10-CM | POA: Diagnosis not present

## 2017-10-27 DIAGNOSIS — M9901 Segmental and somatic dysfunction of cervical region: Secondary | ICD-10-CM | POA: Diagnosis not present

## 2017-10-27 DIAGNOSIS — M47814 Spondylosis without myelopathy or radiculopathy, thoracic region: Secondary | ICD-10-CM | POA: Diagnosis not present

## 2017-10-28 DIAGNOSIS — Z95810 Presence of automatic (implantable) cardiac defibrillator: Secondary | ICD-10-CM | POA: Diagnosis not present

## 2017-10-28 DIAGNOSIS — I255 Ischemic cardiomyopathy: Secondary | ICD-10-CM | POA: Diagnosis not present

## 2017-10-28 DIAGNOSIS — Z7901 Long term (current) use of anticoagulants: Secondary | ICD-10-CM | POA: Diagnosis not present

## 2017-10-28 DIAGNOSIS — I480 Paroxysmal atrial fibrillation: Secondary | ICD-10-CM | POA: Diagnosis not present

## 2017-10-29 DIAGNOSIS — M4723 Other spondylosis with radiculopathy, cervicothoracic region: Secondary | ICD-10-CM | POA: Diagnosis not present

## 2017-10-29 DIAGNOSIS — M9902 Segmental and somatic dysfunction of thoracic region: Secondary | ICD-10-CM | POA: Diagnosis not present

## 2017-10-29 DIAGNOSIS — M47814 Spondylosis without myelopathy or radiculopathy, thoracic region: Secondary | ICD-10-CM | POA: Diagnosis not present

## 2017-10-29 DIAGNOSIS — M9901 Segmental and somatic dysfunction of cervical region: Secondary | ICD-10-CM | POA: Diagnosis not present

## 2017-10-31 ENCOUNTER — Other Ambulatory Visit: Payer: Self-pay | Admitting: Adult Health

## 2017-10-31 ENCOUNTER — Other Ambulatory Visit: Payer: Self-pay | Admitting: Physician Assistant

## 2017-10-31 ENCOUNTER — Other Ambulatory Visit: Payer: Self-pay | Admitting: Internal Medicine

## 2017-10-31 DIAGNOSIS — K219 Gastro-esophageal reflux disease without esophagitis: Secondary | ICD-10-CM

## 2017-11-02 DIAGNOSIS — M47814 Spondylosis without myelopathy or radiculopathy, thoracic region: Secondary | ICD-10-CM | POA: Diagnosis not present

## 2017-11-02 DIAGNOSIS — M9902 Segmental and somatic dysfunction of thoracic region: Secondary | ICD-10-CM | POA: Diagnosis not present

## 2017-11-02 DIAGNOSIS — M4723 Other spondylosis with radiculopathy, cervicothoracic region: Secondary | ICD-10-CM | POA: Diagnosis not present

## 2017-11-02 DIAGNOSIS — M9901 Segmental and somatic dysfunction of cervical region: Secondary | ICD-10-CM | POA: Diagnosis not present

## 2017-11-03 DIAGNOSIS — M47814 Spondylosis without myelopathy or radiculopathy, thoracic region: Secondary | ICD-10-CM | POA: Diagnosis not present

## 2017-11-03 DIAGNOSIS — M4723 Other spondylosis with radiculopathy, cervicothoracic region: Secondary | ICD-10-CM | POA: Diagnosis not present

## 2017-11-03 DIAGNOSIS — M9901 Segmental and somatic dysfunction of cervical region: Secondary | ICD-10-CM | POA: Diagnosis not present

## 2017-11-03 DIAGNOSIS — M9902 Segmental and somatic dysfunction of thoracic region: Secondary | ICD-10-CM | POA: Diagnosis not present

## 2017-11-09 DIAGNOSIS — M9901 Segmental and somatic dysfunction of cervical region: Secondary | ICD-10-CM | POA: Diagnosis not present

## 2017-11-09 DIAGNOSIS — M4723 Other spondylosis with radiculopathy, cervicothoracic region: Secondary | ICD-10-CM | POA: Diagnosis not present

## 2017-11-09 DIAGNOSIS — M9902 Segmental and somatic dysfunction of thoracic region: Secondary | ICD-10-CM | POA: Diagnosis not present

## 2017-11-09 DIAGNOSIS — M47814 Spondylosis without myelopathy or radiculopathy, thoracic region: Secondary | ICD-10-CM | POA: Diagnosis not present

## 2017-11-24 DIAGNOSIS — M47814 Spondylosis without myelopathy or radiculopathy, thoracic region: Secondary | ICD-10-CM | POA: Diagnosis not present

## 2017-11-24 DIAGNOSIS — M9902 Segmental and somatic dysfunction of thoracic region: Secondary | ICD-10-CM | POA: Diagnosis not present

## 2017-11-24 DIAGNOSIS — M9901 Segmental and somatic dysfunction of cervical region: Secondary | ICD-10-CM | POA: Diagnosis not present

## 2017-11-24 DIAGNOSIS — M4723 Other spondylosis with radiculopathy, cervicothoracic region: Secondary | ICD-10-CM | POA: Diagnosis not present

## 2017-11-30 DIAGNOSIS — M4723 Other spondylosis with radiculopathy, cervicothoracic region: Secondary | ICD-10-CM | POA: Diagnosis not present

## 2017-11-30 DIAGNOSIS — M9901 Segmental and somatic dysfunction of cervical region: Secondary | ICD-10-CM | POA: Diagnosis not present

## 2017-11-30 DIAGNOSIS — M9902 Segmental and somatic dysfunction of thoracic region: Secondary | ICD-10-CM | POA: Diagnosis not present

## 2017-11-30 DIAGNOSIS — M47814 Spondylosis without myelopathy or radiculopathy, thoracic region: Secondary | ICD-10-CM | POA: Diagnosis not present

## 2017-12-08 DIAGNOSIS — I480 Paroxysmal atrial fibrillation: Secondary | ICD-10-CM | POA: Diagnosis not present

## 2017-12-08 DIAGNOSIS — Z7901 Long term (current) use of anticoagulants: Secondary | ICD-10-CM | POA: Diagnosis not present

## 2017-12-08 DIAGNOSIS — I255 Ischemic cardiomyopathy: Secondary | ICD-10-CM | POA: Diagnosis not present

## 2017-12-08 DIAGNOSIS — Z95810 Presence of automatic (implantable) cardiac defibrillator: Secondary | ICD-10-CM | POA: Diagnosis not present

## 2017-12-08 NOTE — Progress Notes (Signed)
FOLLOW UP  Assessment and Plan:   Essential hypertension At goal; continue medications Monitor blood pressure at home; call if consistently over 130/80 Continue DASH diet.   Reminder to go to the ER if any CP, SOB, nausea, dizziness, severe HA, changes vision/speech, left arm numbness and tingling and jaw pain.  Paroxysmal atrial fibrillation ([place]) Continue follow up with a fib clinic for coumadin management; rate controled today Discussed if patient falls to immediately contact office or go to ER. Discussed foods that can increase or decrease Coumadin levels. Patient understands to call the office before starting a new medication.  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   Hypothyroidism, unspecified type continue medications the same reminded to take on an empty stomach 30-1mins before food.  -     TSH  Mixed hyperlipidemia At goal for LDL; continue statin- discussed diet for borderline trigs Continue low cholesterol diet and exercise.  -     Lipid panel  Prediabetes Discussed disease and risks of elevated glucose Discussed diet/exercise, weight management  -     Hemoglobin A1c  Medication management -     CBC with Differential/Platelet -     CMP/GFR  Morbid obesity ([place]) Long discussion about weight loss, diet, and exercise Recommended diet heavy in fruits and veggies and low in animal meats, cheeses, and dairy products, appropriate calorie intake Discussed appropriate weight for height  Follow up in 3 months  Vitamin D deficiency At goal; continue supplementation Defer vitamin D level  Continue diet and meds as discussed. Further disposition pending results of labs. Discussed med's effects and SE's.   Over 30 minutes of exam, counseling, chart review, and critical decision making was performed.   Future Appointments  Date Time Provider [place]  03/22/2018  3:00 PM [name], MD [facility] None     ----------------------------------------------------------------------------------------------------------------------  HPI 77 y.o. male  presents for 3 month follow up on hypertension, cholesterol, prediabetes, obesity and vitamin D deficiency. In 2015, he underwent a CABG x 3 V w/po Afib- and failed 2 attempts at VCV. Then in 07/2014, he had a BiVent, AICD/Defib/PM planted. He is followed at his Cardiologist's coumadin clinic.  Patient denies any cardiac symptoms as chest pain, palpitations, shortness of breath, dizziness or ankle swelling. He has emphysema controlled on inhalers, but reports he recently moved house and has been much better controlled and has been able to stop using inhalers without previous symptoms.    BMI is Body mass index is 37.99 kg/m., he has been working on diet, not exercising but very active moving house, lifting frequently.  Wt Readings from Last 3 Encounters:  12/09/17 261 lb (118.4 kg)  09/01/17 262 lb 8 oz (119.1 kg)  06/01/17 268 lb (121.6 kg)   His blood pressure has been controlled at home, today their BP is BP: 110/74  He does not workout. He denies chest pain, shortness of breath, dizziness.   He is on cholesterol medication (atorvastatin 40 mg daily) and denies myalgias. His cholesterol is at goal. The cholesterol last visit was:   Lab Results  Component Value Date   CHOL 113 09/01/2017   HDL 20 (L) 09/01/2017   LDLCALC 70 09/01/2017   TRIG 156 (H) 09/01/2017   CHOLHDL 5.7 (H) 09/01/2017    He has been working on diet and exercise for prediabetes, and denies foot ulcerations, increased appetite, nausea, paresthesia of the feet, polydipsia, polyuria, visual disturbances and vomiting. Last A1C in the office was:  Lab Results  Component Value Date   HGBA1C 5.9 (H) 09/01/2017   He is on thyroid medication. His medication was not changed last visit.   Lab Results  Component Value Date   TSH 4.55 (H) 09/01/2017   Patient is on Vitamin D  supplement.   Lab Results  Component Value Date   VD25OH 20 09/01/2017        Current Medications:  Current Outpatient Medications on File Prior to Visit  Medication Sig  . acetaminophen (TYLENOL) 500 MG tablet Take 500 mg by mouth every 6 (six) hours as needed for mild pain.   Marland Kitchen albuterol (PROVENTIL HFA;VENTOLIN HFA) 108 (90 Base) MCG/ACT inhaler Inhale 2 puffs into the lungs every 6 (six) hours as needed for wheezing or shortness of breath.  Marland Kitchen albuterol (PROVENTIL) (2.5 MG/3ML) 0.083% nebulizer solution Take 3 mLs (2.5 mg total) by nebulization every 6 (six) hours as needed for wheezing or shortness of breath.  . allopurinol (ZYLOPRIM) 300 MG tablet TAKE 1 TABLET EVERY DAY  . aspirin EC 81 MG tablet Take 81 mg by mouth daily.  Marland Kitchen atorvastatin (LIPITOR) 40 MG tablet TAKE 1 TABLET EVERY DAY  . Black Pepper-Turmeric (TURMERIC COMPLEX/BLACK PEPPER PO) Take 900 mg by mouth daily.   . Cholecalciferol (VITAMIN D PO) Take 15,000 Units by mouth daily.   Marland Kitchen docusate sodium (COLACE) 100 MG capsule Take 100 mg by mouth 2 (two) times daily. 1 tablet in the morning and 1 tablet at bedtime  . doxazosin (CARDURA) 4 MG tablet TAKE 1 TABLET EVERY DAY  . finasteride (PROSCAR) 5 MG tablet TAKE 1 TABLET EVERY DAY  . fluticasone (FLONASE) 50 MCG/ACT nasal spray Place 2 sprays into both nostrils at bedtime. PRN  . fluticasone furoate-vilanterol (BREO ELLIPTA) 100-25 MCG/INH AEPB Inhale 1 puff into the lungs daily. Rinse mouth with water after each use  . furosemide (LASIX) 40 MG tablet TAKE 1 TABLET EVERY DAY  . levothyroxine (SYNTHROID, LEVOTHROID) 200 MCG tablet TAKE 1 TABLET EVERY DAY BEFORE BREAKFAST  . losartan (COZAAR) 50 MG tablet Take 1 tablet (50 mg total) by mouth daily.  . Magnesium 400 MG TABS Take 1 tablet by mouth daily.  . metoprolol succinate (TOPROL-XL) 50 MG 24 hr tablet TAKE 1 TABLET EVERY DAY WITH FOOD  OR  IMMEDIATELY FOLLOWING A MEAL  . omeprazole (PRILOSEC) 20 MG capsule TAKE 1 CAPSULE  TWICE DAILY (Patient taking differently: prn)  . OVER THE COUNTER MEDICATION daily. Vitamin A to Z 50 plus  . polyethylene glycol (MIRALAX / GLYCOLAX) packet Take 17 g by mouth daily.  . ranitidine (ZANTAC) 150 MG tablet TAKE 1 TABLET TWICE DAILY  . silver sulfADIAZINE (SILVADENE) 1 % cream Apply 1 application topically daily.  Marland Kitchen spironolactone (ALDACTONE) 25 MG tablet TAKE 1 TABLET EVERY DAY  . warfarin (COUMADIN) 5 MG tablet TAKE 1 AND 1/2 TABLETS EVERY DAY  OR AS DIRECTED  . diphenhydrAMINE (BENADRYL) 25 MG tablet Take 25 mg by mouth at bedtime.   No current facility-administered medications on file prior to visit.      Allergies:  Allergies  Allergen Reactions  . Other Swelling    SODIUM SENSITIVE     Medical History:  Past Medical History:  Diagnosis Date  . A-fib ([place]) 01/2014  . AICD (automatic cardioverter/defibrillator) present    [employer]  . Arthritis   . ASHD (arteriosclerotic heart disease) 2015   s/p CABG  . Cataract    s/p left  . COPD (chronic obstructive pulmonary  disease) ([place])    by CXR, pt unsure of this  . GERD (gastroesophageal reflux disease)   . Hyperlipidemia   . Hypertension   . Hypothyroidism   . LBBB (left bundle branch block)   . Pre-diabetes   . Prediabetes 01/2014  . Presence of permanent cardiac pacemaker   . Sleep apnea    no CPAP  . Thyroid disease    hypothyroid   Family history- Reviewed and unchanged Social history- Reviewed and unchanged   Review of Systems:  Review of Systems  Constitutional: Negative for malaise/fatigue and weight loss.  HENT: Negative for hearing loss and tinnitus.   Eyes: Negative for blurred vision and double vision.  Respiratory: Negative for cough, shortness of breath and wheezing.   Cardiovascular: Negative for chest pain, palpitations, orthopnea, claudication and leg swelling.  Gastrointestinal: Negative for abdominal pain, blood in stool, constipation, diarrhea, heartburn, melena, nausea  and vomiting.  Genitourinary: Negative.   Musculoskeletal: Negative for joint pain and myalgias.  Skin: Negative for rash.  Neurological: Negative for dizziness, tingling, sensory change, weakness and headaches.  Endo/Heme/Allergies: Negative for polydipsia.  Psychiatric/Behavioral: Negative.   All other systems reviewed and are negative.     Physical Exam: BP 110/74   Pulse 83   Temp (!) 97.3 F (36.3 C)   Ht 5' 9.5" (1.765 m)   Wt 261 lb (118.4 kg)   SpO2 97%   BMI 37.99 kg/m  Wt Readings from Last 3 Encounters:  12/09/17 261 lb (118.4 kg)  09/01/17 262 lb 8 oz (119.1 kg)  06/01/17 268 lb (121.6 kg)   General Appearance: Well nourished, in no apparent distress. Eyes: PERRLA, EOMs, conjunctiva no swelling or erythema Sinuses: No Frontal/maxillary tenderness ENT/Mouth: Ext aud canals clear, TMs without erythema, bulging. No erythema, swelling, or exudate on post pharynx.  Tonsils not swollen or erythematous. Hearing normal.  Neck: Supple, thyroid normal.  Respiratory: Respiratory effort normal, BS equal bilaterally without rales, rhonchi, wheezing or stridor.  Cardio: RRR with no MRGs. Brisk peripheral pulses without edema.  Abdomen: Soft, + BS.  Non tender, no guarding, rebound, hernias, masses. Lymphatics: Non tender without lymphadenopathy.  Musculoskeletal: Full ROM, 5/5 strength, Normal gait Skin: Warm, dry without rashes, lesions, ecchymosis.  Neuro: Cranial nerves intact. No cerebellar symptoms.  Psych: Awake and oriented X 3, normal affect, Insight and Judgment appropriate.    Izora Ribas, NP 11:47 AM [facility]

## 2017-12-09 ENCOUNTER — Encounter: Payer: Self-pay | Admitting: Adult Health

## 2017-12-09 ENCOUNTER — Ambulatory Visit (INDEPENDENT_AMBULATORY_CARE_PROVIDER_SITE_OTHER): Payer: Medicare Other | Admitting: Adult Health

## 2017-12-09 VITALS — BP 110/74 | HR 83 | Temp 97.3°F | Ht 69.5 in | Wt 261.0 lb

## 2017-12-09 DIAGNOSIS — E782 Mixed hyperlipidemia: Secondary | ICD-10-CM | POA: Diagnosis not present

## 2017-12-09 DIAGNOSIS — Z79899 Other long term (current) drug therapy: Secondary | ICD-10-CM | POA: Diagnosis not present

## 2017-12-09 DIAGNOSIS — D75839 Thrombocytosis, unspecified: Secondary | ICD-10-CM

## 2017-12-09 DIAGNOSIS — E559 Vitamin D deficiency, unspecified: Secondary | ICD-10-CM | POA: Diagnosis not present

## 2017-12-09 DIAGNOSIS — I2510 Atherosclerotic heart disease of native coronary artery without angina pectoris: Secondary | ICD-10-CM

## 2017-12-09 DIAGNOSIS — D473 Essential (hemorrhagic) thrombocythemia: Secondary | ICD-10-CM

## 2017-12-09 DIAGNOSIS — I10 Essential (primary) hypertension: Secondary | ICD-10-CM | POA: Diagnosis not present

## 2017-12-09 DIAGNOSIS — R7303 Prediabetes: Secondary | ICD-10-CM | POA: Diagnosis not present

## 2017-12-09 DIAGNOSIS — K219 Gastro-esophageal reflux disease without esophagitis: Secondary | ICD-10-CM

## 2017-12-09 DIAGNOSIS — E039 Hypothyroidism, unspecified: Secondary | ICD-10-CM

## 2017-12-09 DIAGNOSIS — J439 Emphysema, unspecified: Secondary | ICD-10-CM | POA: Diagnosis not present

## 2017-12-09 DIAGNOSIS — I480 Paroxysmal atrial fibrillation: Secondary | ICD-10-CM | POA: Diagnosis not present

## 2017-12-09 MED ORDER — OMEPRAZOLE 20 MG PO CPDR: 20.0000 mg | DELAYED_RELEASE_CAPSULE | Freq: Two times a day (BID) | ORAL | 1 refills | Status: DC | PRN

## 2017-12-09 NOTE — Patient Instructions (Addendum)
Start slow - 5 min at a time on your treadmill and gradually work your way up. Goal is to get to 30 min daily total    Aim for 7+ servings of fruits and vegetables daily  80+ fluid ounces of water or unsweet tea for healthy kidneys  Limit animal fats in diet for cholesterol and heart health - choose grass fed whenever available  Aim for low stress - take time to unwind and care for your mental health  Aim for 150 min of moderate intensity exercise weekly for heart health, and weights twice weekly for bone health  Aim for 7-9 hours of sleep daily      When it comes to diets, agreement about the perfect plan isn't easy to find, even among the experts. Experts at the [employer] developed an idea known as the Healthy Eating Plate. Just imagine a plate divided into logical, healthy portions.  The emphasis is on diet quality:  Load up on vegetables and fruits - one-half of your plate: Aim for color and variety, and remember that potatoes don't count.  Go for whole grains - one-quarter of your plate: Whole wheat, barley, wheat berries, quinoa, oats, brown rice, and foods made with them. If you want pasta, go with whole wheat pasta.  Protein power - one-quarter of your plate: Fish, chicken, beans, and nuts are all healthy, versatile protein sources. Limit red meat.  The diet, however, does go beyond the plate, offering a few other suggestions.  Use healthy plant oils, such as olive, canola, soy, corn, sunflower and peanut. Check the labels, and avoid partially hydrogenated oil, which have unhealthy trans fats.  If you're thirsty, drink water. Coffee and tea are good in moderation, but skip sugary drinks and limit milk and dairy products to one or two daily servings.  The type of carbohydrate in the diet is more important than the amount. Some sources of carbohydrates, such as vegetables, fruits, whole grains, and beans-are healthier than others.  Finally, stay  active.

## 2017-12-10 ENCOUNTER — Other Ambulatory Visit: Payer: Self-pay | Admitting: Adult Health

## 2017-12-10 DIAGNOSIS — D75839 Thrombocytosis, unspecified: Secondary | ICD-10-CM

## 2017-12-10 DIAGNOSIS — D72829 Elevated white blood cell count, unspecified: Secondary | ICD-10-CM

## 2017-12-10 DIAGNOSIS — D473 Essential (hemorrhagic) thrombocythemia: Secondary | ICD-10-CM

## 2017-12-10 DIAGNOSIS — D729 Disorder of white blood cells, unspecified: Secondary | ICD-10-CM

## 2017-12-10 LAB — COMPLETE METABOLIC PANEL WITH GFR
AG Ratio: 2.4 (calc) (ref 1.0–2.5)
ALKALINE PHOSPHATASE (APISO): 60 U/L (ref 40–115)
ALT: 21 U/L (ref 9–46)
AST: 26 U/L (ref 10–35)
Albumin: 4.8 g/dL (ref 3.6–5.1)
BILIRUBIN TOTAL: 1.1 mg/dL (ref 0.2–1.2)
BUN: 22 mg/dL (ref 7–25)
CHLORIDE: 100 mmol/L (ref 98–110)
CO2: 33 mmol/L — AB (ref 20–32)
CREATININE: 1.04 mg/dL (ref 0.70–1.18)
Calcium: 9.8 mg/dL (ref 8.6–10.3)
GFR, Est African American: 80 mL/min/{1.73_m2} (ref >=60)
GFR, Est Non African American: 69 mL/min/{1.73_m2} (ref >=60)
GLUCOSE: 102 mg/dL — AB (ref 65–99)
Globulin: 2 g/dL (calc) (ref 1.9–3.7)
Potassium: 5.2 mmol/L (ref 3.5–5.3)
Sodium: 139 mmol/L (ref 135–146)
TOTAL PROTEIN: 6.8 g/dL (ref 6.1–8.1)

## 2017-12-10 LAB — CBC WITH DIFFERENTIAL/PLATELET
BASOS PCT: 1.3 %
Basophils Absolute: 155 cells/uL (ref 0–200)
EOS PCT: 2.1 %
Eosinophils Absolute: 250 cells/uL (ref 15–500)
HCT: 45.8 % (ref 38.5–50.0)
Hemoglobin: 14.7 g/dL (ref 13.2–17.1)
Lymphs Abs: 1297 cells/uL (ref 850–3900)
MCH: 28.2 pg (ref 27.0–33.0)
MCHC: 32.1 g/dL (ref 32.0–36.0)
MCV: 87.7 fL (ref 80.0–100.0)
MONOS PCT: 4.3 %
MPV: 11.2 fL (ref 7.5–12.5)
NEUTROS ABS: 9687 {cells}/uL — AB (ref 1500–7800)
Neutrophils Relative %: 81.4 %
PLATELETS: 649 10*3/uL — AB (ref 140–400)
RBC: 5.22 10*6/uL (ref 4.20–5.80)
RDW: 18.4 % — ABNORMAL HIGH (ref 11.0–15.0)
TOTAL LYMPHOCYTE: 10.9 %
WBC mixed population: 512 cells/uL (ref 200–950)
WBC: 11.9 10*3/uL — ABNORMAL HIGH (ref 3.8–10.8)

## 2017-12-10 LAB — LIPID PANEL
CHOLESTEROL: 131 mg/dL (ref <200)
HDL: 22 mg/dL — AB (ref >40)
LDL CHOLESTEROL (CALC): 82 mg/dL
Non-HDL Cholesterol (Calc): 109 mg/dL (calc) (ref <130)
Total CHOL/HDL Ratio: 6 (calc) — ABNORMAL HIGH (ref <5.0)
Triglycerides: 176 mg/dL — ABNORMAL HIGH (ref <150)

## 2017-12-10 LAB — TSH: TSH: 2.95 mIU/L (ref 0.40–4.50)

## 2017-12-10 LAB — HEMOGLOBIN A1C
EAG (MMOL/L): 6.8 (calc)
HEMOGLOBIN A1C: 5.9 %{Hb} — AB (ref <5.7)
MEAN PLASMA GLUCOSE: 123 (calc)

## 2017-12-10 LAB — MAGNESIUM: MAGNESIUM: 1.9 mg/dL (ref 1.5–2.5)

## 2017-12-22 DIAGNOSIS — H16223 Keratoconjunctivitis sicca, not specified as Sjogren's, bilateral: Secondary | ICD-10-CM | POA: Diagnosis not present

## 2017-12-22 DIAGNOSIS — H26493 Other secondary cataract, bilateral: Secondary | ICD-10-CM | POA: Diagnosis not present

## 2017-12-22 DIAGNOSIS — Z961 Presence of intraocular lens: Secondary | ICD-10-CM | POA: Diagnosis not present

## 2017-12-24 ENCOUNTER — Other Ambulatory Visit: Payer: Self-pay | Admitting: Internal Medicine

## 2017-12-25 ENCOUNTER — Other Ambulatory Visit: Payer: Medicare Other

## 2017-12-25 ENCOUNTER — Telehealth: Payer: Self-pay | Admitting: *Deleted

## 2017-12-25 ENCOUNTER — Other Ambulatory Visit: Payer: Self-pay | Admitting: Internal Medicine

## 2017-12-25 DIAGNOSIS — D473 Essential (hemorrhagic) thrombocythemia: Secondary | ICD-10-CM | POA: Diagnosis not present

## 2017-12-25 DIAGNOSIS — D75839 Thrombocytosis, unspecified: Secondary | ICD-10-CM

## 2017-12-25 DIAGNOSIS — D729 Disorder of white blood cells, unspecified: Secondary | ICD-10-CM

## 2017-12-25 DIAGNOSIS — D72829 Elevated white blood cell count, unspecified: Secondary | ICD-10-CM | POA: Diagnosis not present

## 2017-12-25 MED ORDER — CEPHALEXIN 250 MG PO CAPS: ORAL_CAPSULE | ORAL | 0 refills | Status: DC

## 2017-12-25 NOTE — Telephone Encounter (Signed)
Per Dr Melford Aase, a message was left for the patient regarding taking Cephalexin and Coumadin.  The patient was advised , an RX had been sent to his pharmacy for an antibiotic for his tooth infection. He was advised to reduce his Coumadin dose to 1/2 while on the Cephalexin and to have his Protime checked at the office that monitors his blood in 3 to 4 days. He was advised to call our office , if he does not understand the directions.

## 2017-12-29 ENCOUNTER — Encounter: Payer: Self-pay | Admitting: Adult Health

## 2017-12-29 ENCOUNTER — Other Ambulatory Visit: Payer: Self-pay | Admitting: Adult Health

## 2017-12-29 DIAGNOSIS — D473 Essential (hemorrhagic) thrombocythemia: Secondary | ICD-10-CM

## 2017-12-29 DIAGNOSIS — Z7901 Long term (current) use of anticoagulants: Secondary | ICD-10-CM | POA: Diagnosis not present

## 2017-12-29 DIAGNOSIS — D72828 Other elevated white blood cell count: Secondary | ICD-10-CM

## 2017-12-29 DIAGNOSIS — Z950 Presence of cardiac pacemaker: Secondary | ICD-10-CM

## 2017-12-29 DIAGNOSIS — D75839 Thrombocytosis, unspecified: Secondary | ICD-10-CM | POA: Insufficient documentation

## 2017-12-29 DIAGNOSIS — I480 Paroxysmal atrial fibrillation: Secondary | ICD-10-CM | POA: Diagnosis not present

## 2017-12-29 DIAGNOSIS — I255 Ischemic cardiomyopathy: Secondary | ICD-10-CM | POA: Diagnosis not present

## 2017-12-29 DIAGNOSIS — Z95810 Presence of automatic (implantable) cardiac defibrillator: Secondary | ICD-10-CM | POA: Diagnosis not present

## 2017-12-29 LAB — LEUKEMIA/LYMPHOMA EVALUATION PANEL
NUMBER OF MARKERS:: 22
VIABILITY:: 80 %

## 2018-01-05 DIAGNOSIS — I480 Paroxysmal atrial fibrillation: Secondary | ICD-10-CM | POA: Diagnosis not present

## 2018-01-05 DIAGNOSIS — I255 Ischemic cardiomyopathy: Secondary | ICD-10-CM | POA: Diagnosis not present

## 2018-01-05 DIAGNOSIS — Z95810 Presence of automatic (implantable) cardiac defibrillator: Secondary | ICD-10-CM | POA: Diagnosis not present

## 2018-01-05 DIAGNOSIS — Z7901 Long term (current) use of anticoagulants: Secondary | ICD-10-CM | POA: Diagnosis not present

## 2018-01-12 ENCOUNTER — Other Ambulatory Visit: Payer: Self-pay | Admitting: Internal Medicine

## 2018-01-12 ENCOUNTER — Ambulatory Visit: Payer: Self-pay

## 2018-01-14 DIAGNOSIS — I480 Paroxysmal atrial fibrillation: Secondary | ICD-10-CM | POA: Diagnosis not present

## 2018-01-14 DIAGNOSIS — I2510 Atherosclerotic heart disease of native coronary artery without angina pectoris: Secondary | ICD-10-CM | POA: Diagnosis not present

## 2018-01-14 DIAGNOSIS — Z95810 Presence of automatic (implantable) cardiac defibrillator: Secondary | ICD-10-CM | POA: Diagnosis not present

## 2018-01-14 DIAGNOSIS — I255 Ischemic cardiomyopathy: Secondary | ICD-10-CM | POA: Diagnosis not present

## 2018-01-14 IMAGING — US US AORTA
1 series · 14 of 23 positions shown · non-contrast
Comparison: None.

CLINICAL DATA: Continued surveillance of aortic size.

EXAM:
ULTRASOUND OF ABDOMINAL AORTA
TECHNIQUE: Ultrasound examination of the abdominal aorta was performed to
evaluate for abdominal aortic aneurysm.

[Series 1: us aorta · 0.28mm/px · 14 of 23 slices shown]
[im 1/23]
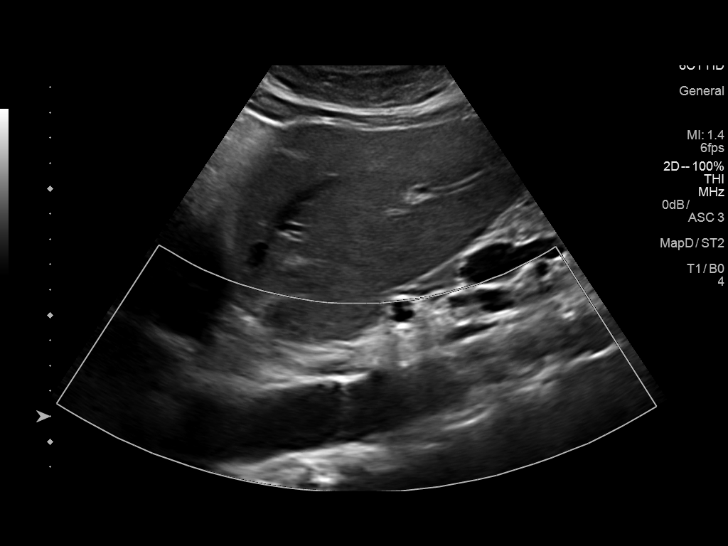
[im 3/23]
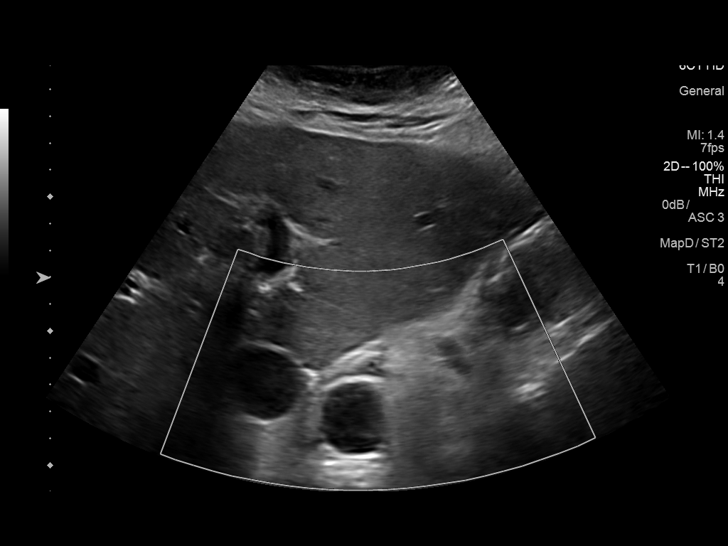
[im 5/23]
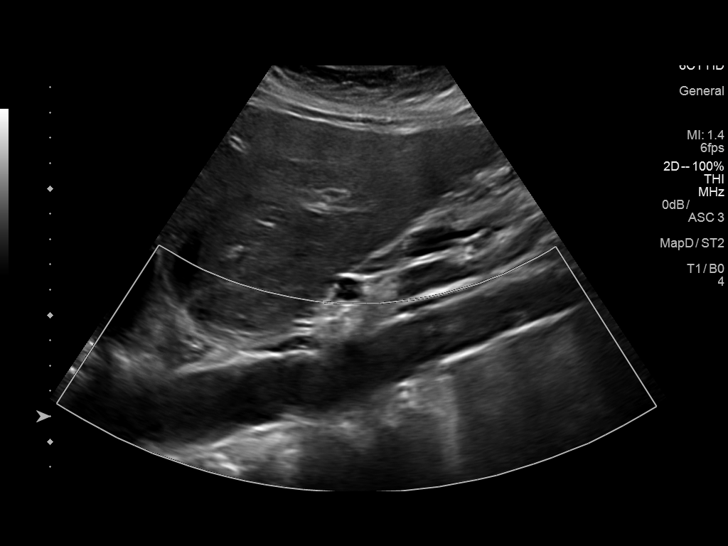
[im 6/23]
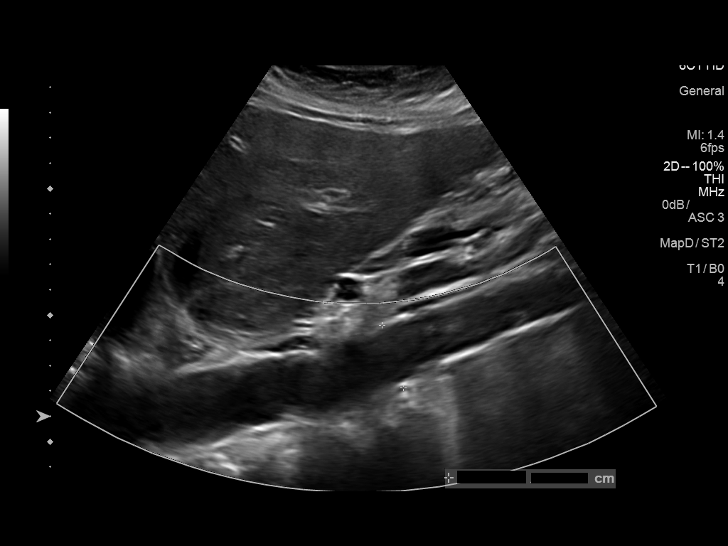
[im 8/23]
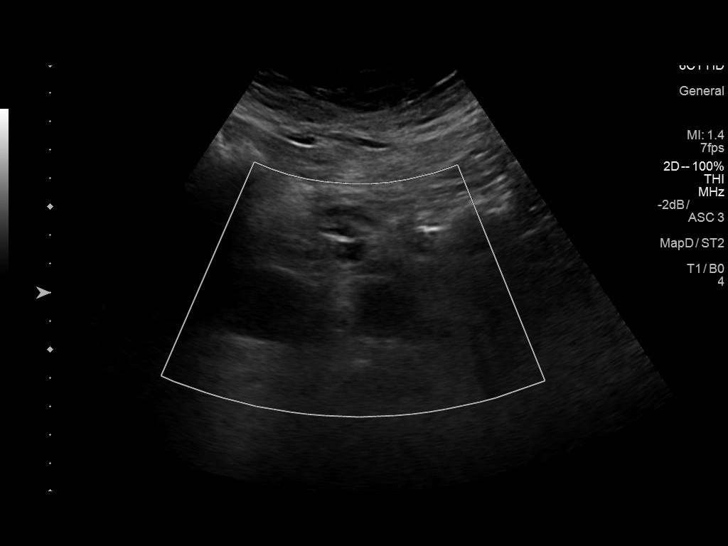
[im 10/23]
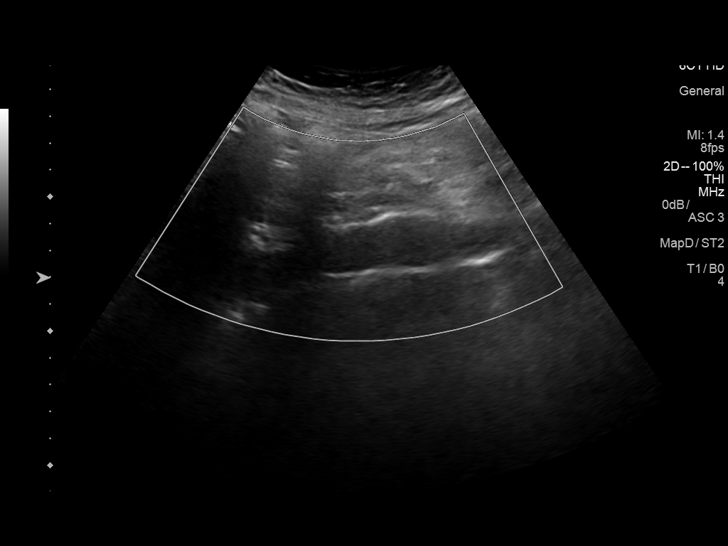
[im 11/23]
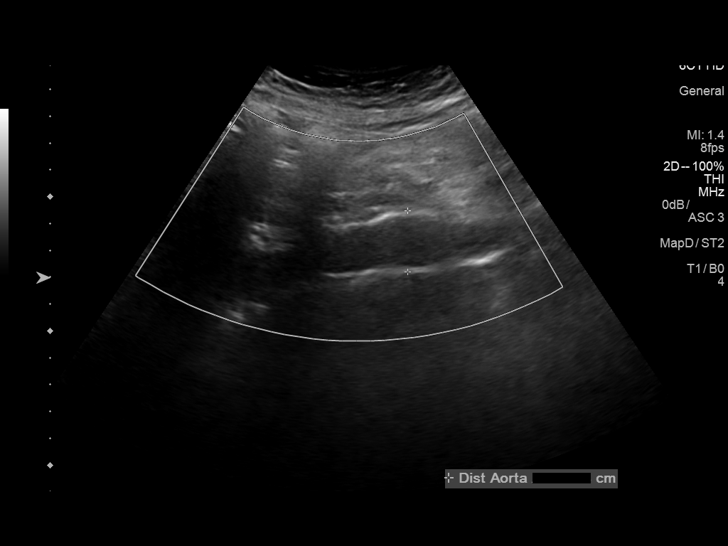
[im 13/23]
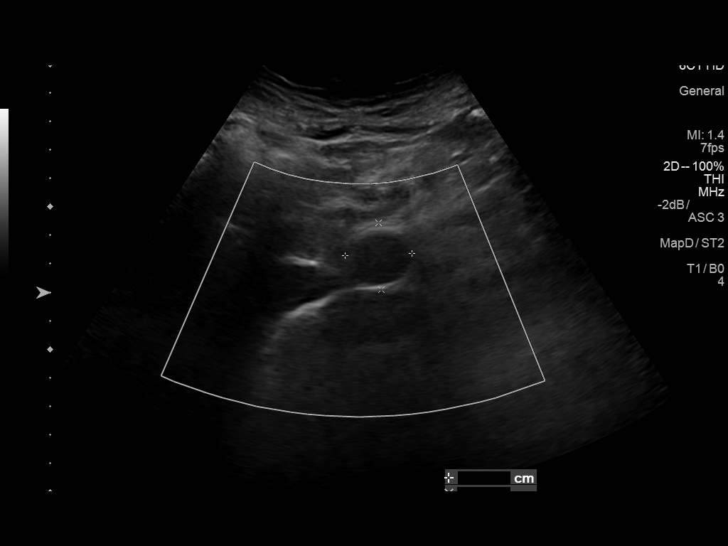
[im 14/23]
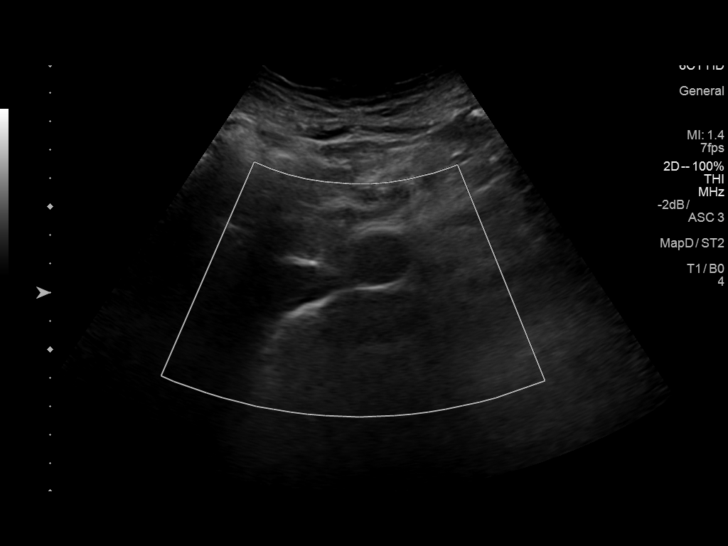
[im 16/23]
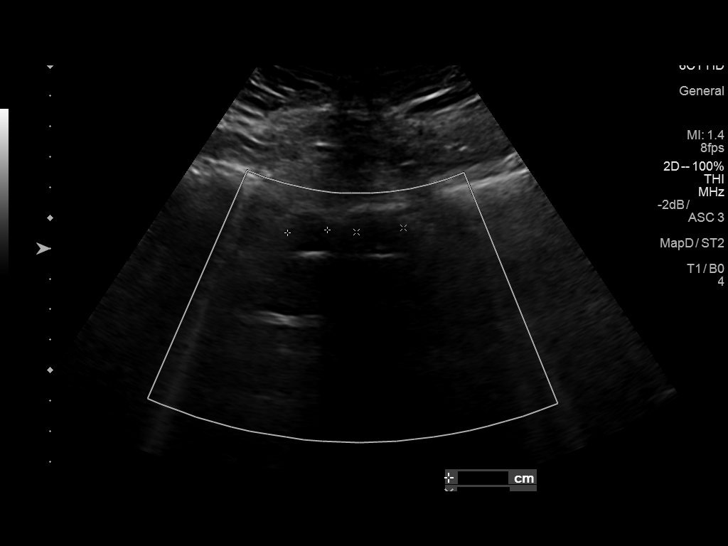
[im 18/23]
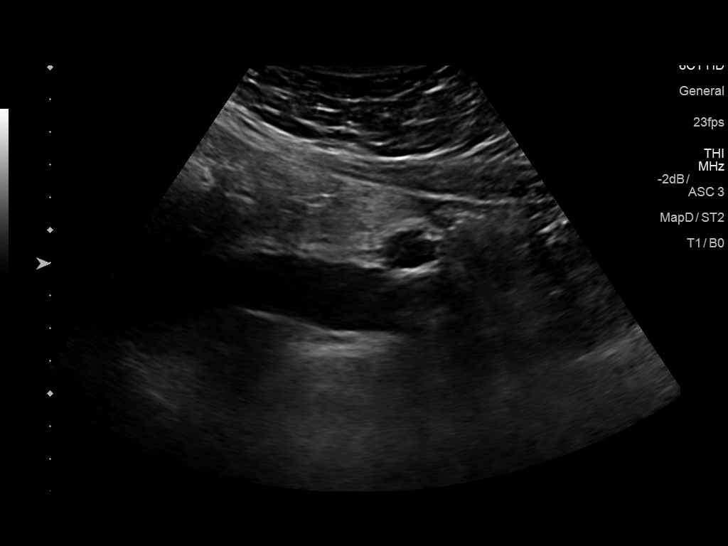
[im 19/23]
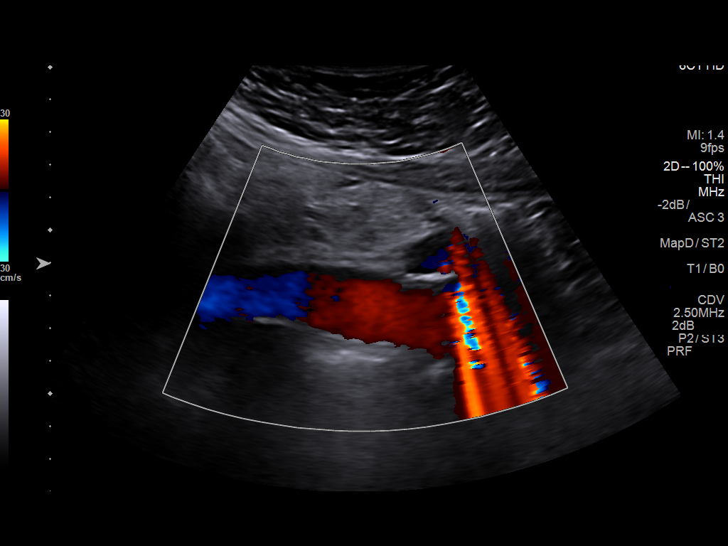
[im 21/23]
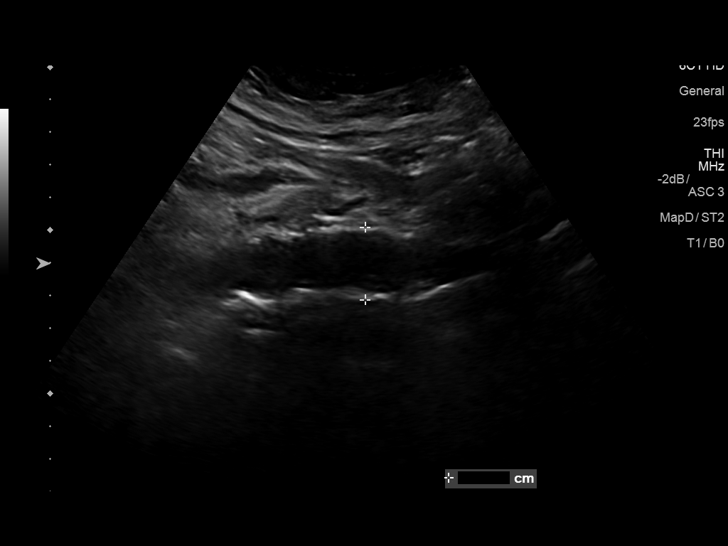
[im 23/23]
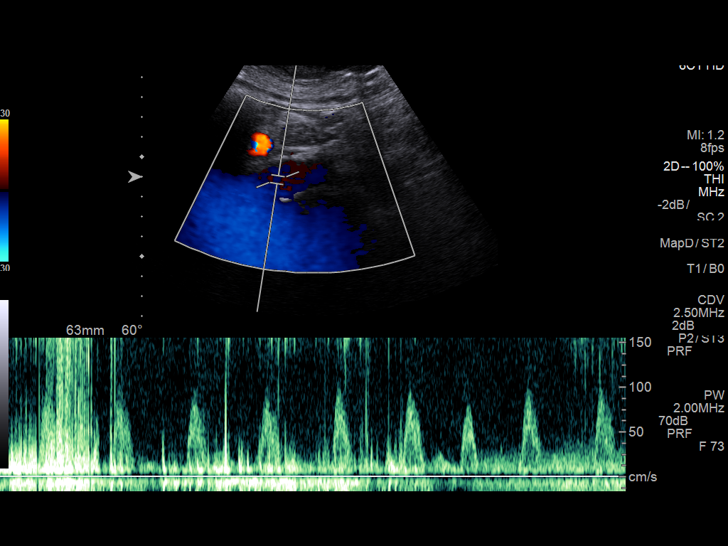

[14 of 23 positions shown; findings below may reference images not displayed]

FINDINGS: Abdominal aortic measurements as follows:

Proximal:  2.9 x 2.8 cm

Mid:  2.6 x 2.6 cm

Distal:  2.3 x 2.3 cm
IMPRESSION: No abdominal aortic aneurysm.

Ectatic abdominal aorta at risk for aneurysm development. Recommend
followup by ultrasound in 5 years. This recommendation follows ACR
consensus guidelines: White Paper of the ACR Incidental Findings
Committee II on Vascular Findings. [HOSPITAL] 2199;

## 2018-01-20 DIAGNOSIS — I358 Other nonrheumatic aortic valve disorders: Secondary | ICD-10-CM | POA: Diagnosis not present

## 2018-01-20 DIAGNOSIS — I517 Cardiomegaly: Secondary | ICD-10-CM | POA: Diagnosis not present

## 2018-01-20 DIAGNOSIS — I361 Nonrheumatic tricuspid (valve) insufficiency: Secondary | ICD-10-CM | POA: Diagnosis not present

## 2018-01-20 DIAGNOSIS — I4891 Unspecified atrial fibrillation: Secondary | ICD-10-CM | POA: Diagnosis not present

## 2018-01-20 DIAGNOSIS — I088 Other rheumatic multiple valve diseases: Secondary | ICD-10-CM | POA: Diagnosis not present

## 2018-01-20 DIAGNOSIS — I340 Nonrheumatic mitral (valve) insufficiency: Secondary | ICD-10-CM | POA: Diagnosis not present

## 2018-01-20 DIAGNOSIS — I480 Paroxysmal atrial fibrillation: Secondary | ICD-10-CM | POA: Diagnosis not present

## 2018-01-22 ENCOUNTER — Telehealth: Payer: Self-pay | Admitting: Hematology

## 2018-01-22 ENCOUNTER — Encounter: Payer: Self-pay | Admitting: Hematology

## 2018-01-22 NOTE — Telephone Encounter (Signed)
New referral received from [name], NP for thrombocytopenia. Pt cld back to schedule a hematology appt. Pt has been scheduled to see Dr. Irene Limbo on 9/24 at 1pm. Pt aware to arrive 30 minutes early. Letter mailed.

## 2018-02-02 DIAGNOSIS — I255 Ischemic cardiomyopathy: Secondary | ICD-10-CM | POA: Diagnosis not present

## 2018-02-02 DIAGNOSIS — Z7901 Long term (current) use of anticoagulants: Secondary | ICD-10-CM | POA: Diagnosis not present

## 2018-02-02 DIAGNOSIS — Z95810 Presence of automatic (implantable) cardiac defibrillator: Secondary | ICD-10-CM | POA: Diagnosis not present

## 2018-02-02 DIAGNOSIS — I480 Paroxysmal atrial fibrillation: Secondary | ICD-10-CM | POA: Diagnosis not present

## 2018-02-08 NOTE — Progress Notes (Signed)
HEMATOLOGY/ONCOLOGY CONSULTATION NOTE  Date of Service: 02/09/2018  Patient Care Team: [name], MD as PCP - General (Internal Medicine)  CHIEF COMPLAINTS/PURPOSE OF CONSULTATION:  Thrombocytosis   HISTORY OF PRESENTING ILLNESS:   [name] [name] is a wonderful 77 y.o. male who has been referred to [place] by Dr. [name]  for evaluation and management of Thrombocytosis. He is accompanied today by his wife. The pt reports that he is doing well overall.   The pt reports that he has never had any blood clots.  He has atrial fibrillation and has taken Coumadin since 2015. He had heart surgery in 2015, and had his ICD placed in 2016. The pt also takes 81g aspirin daily.   The pt notes that he has not been aware of his high platelets previously, but only as of his most recent labs. He denies any recent major bleeds, surgeries, and other trauma. He denies having a splenectomy at any time as well.   The pt notes that his right kidney gets sore from time to time when he doesn't stay well hydrated. He also notes that when he urinates at these times, his urine is very dark. The pt adds that he had kidney stones one time. The pt is also taking Lasix and notes that he urinates frequently.   The pt adds that his gums have been very sore, even to touch from his tongue.  The pt notes that he does not want to use a CPAP after a bad experience with a previous sleep study that did diagnose him with sleep apnea. He notes that he falls asleep several times a day as well, falling asleep easily when he sits down, and denying falling asleep while driving. He also notes some increased difficulty remembering.   Most recent lab results (12/09/17) of CBC is as follows: all values are WNL except for WBC at 11.9k, RDW at 18.4, PLT at 649k, ANC at 9.7k.  On review of systems, pt reports good energy levels, falling asleep several times a day, staying active, and denies recent surgery, recent trauma,  bleeding, unexpected weight loss, pain along the spine, abdominal pains, and any other symptoms.   On Family Hx the pt reports prostate cancer.    MEDICAL HISTORY:  Past Medical History:  Diagnosis Date  . A-fib ([place]) 01/2014  . AICD (automatic cardioverter/defibrillator) present    [employer]  . Arthritis   . ASHD (arteriosclerotic heart disease) 2015   s/p CABG  . Cataract    s/p left  . COPD (chronic obstructive pulmonary disease) ([place])    by CXR, pt unsure of this  . GERD (gastroesophageal reflux disease)   . Gout 2014  . Hyperlipidemia   . Hypertension   . Hypothyroidism   . LBBB (left bundle branch block)   . Pre-diabetes   . Prediabetes 01/2014  . Presence of permanent cardiac pacemaker   . Sleep apnea    no CPAP  . Thyroid disease    hypothyroid    SURGICAL HISTORY: Past Surgical History:  Procedure Laterality Date  . CARDIAC DEFIBRILLATOR PLACEMENT  07/2014  . CATARACT EXTRACTION W/ INTRAOCULAR LENS IMPLANT Left   . CORONARY ARTERY BYPASS GRAFT  11/2013  . lumb laminectomy  [phone]   x 3  . LUMBAR FUSION  2013  . NASAL SEPTOPLASTY W/ TURBINOPLASTY Bilateral 01/30/2016   Procedure: NASAL SEPTOPLASTY WITH BILATERAL TURBINATE REDUCTION;  Surgeon: Rozetta Nunnery, MD;  Location: [facility];  Service: ENT;  Laterality: Bilateral;  . UVULECTOMY N/A 01/30/2016   Procedure: UVULECTOMY;  Surgeon: Rozetta Nunnery, MD;  Location: [facility] OR;  Service: ENT;  Laterality: N/A;    SOCIAL HISTORY: Social History   Socioeconomic History  . Marital status: Married    Spouse name: [name]   . Number of children: Not on file  . Years of education: 39  . Highest education level: Not on file  Occupational History  . Occupation: Retired  Scientific laboratory technician  . Financial resource strain: Not on file  . Food insecurity:    Worry: Not on file    Inability: Not on file  . Transportation needs:    Medical: Not on file    Non-medical: Not on file  Tobacco Use  . Smoking  status: Former Smoker    Packs/day: 2.00    Years: 25.00    Pack years: 50.00    Types: Cigarettes    Last attempt to quit: 05/20/1983    Years since quitting: 34.7  . Smokeless tobacco: Never Used  Substance and Sexual Activity  . Alcohol use: Not Currently    Alcohol/week: 0.0 standard drinks  . Drug use: No  . Sexual activity: Not on file  Lifestyle  . Physical activity:    Days per week: Not on file    Minutes per session: Not on file  . Stress: Not on file  Relationships  . Social connections:    Talks on phone: Not on file    Gets together: Not on file    Attends religious service: Not on file    Active member of club or organization: Not on file    Attends meetings of clubs or organizations: Not on file    Relationship status: Not on file  . Intimate partner violence:    Fear of current or ex partner: Not on file    Emotionally abused: Not on file    Physically abused: Not on file    Forced sexual activity: Not on file  Other Topics Concern  . Not on file  Social History Narrative   Lives with wife, [name]   Caffeine use: daily (Coffee)    FAMILY HISTORY: Family History  Problem Relation Age of Onset  . Alzheimer's disease Mother   . Mental illness Mother   . Depression Mother   . Heart disease Father 2       had 5 MI's & died 73 yo  . Alcohol abuse Son   . Stroke Maternal Grandmother     ALLERGIES:  is allergic to other.  MEDICATIONS:  Current Outpatient Medications  Medication Sig Dispense Refill  . acetaminophen (TYLENOL) 500 MG tablet Take 500 mg by mouth every 6 (six) hours as needed for mild pain.     Marland Kitchen allopurinol (ZYLOPRIM) 300 MG tablet TAKE 1 TABLET EVERY DAY 90 tablet 3  . aspirin EC 81 MG tablet Take 81 mg by mouth daily.    Marland Kitchen atorvastatin (LIPITOR) 40 MG tablet TAKE 1 TABLET EVERY DAY 90 tablet 1  . Black Pepper-Turmeric (TURMERIC COMPLEX/BLACK PEPPER PO) Take 900 mg by mouth daily.     . Cholecalciferol (VITAMIN D PO) Take 15,000 Units by  mouth daily.     . diphenhydrAMINE (BENADRYL) 25 MG tablet Take 25 mg by mouth at bedtime.    . docusate sodium (COLACE) 100 MG capsule Take 100 mg by mouth 2 (two) times daily. 1 tablet in the morning and 1 tablet at bedtime    . doxazosin (  CARDURA) 4 MG tablet TAKE 1 TABLET EVERY DAY 90 tablet 3  . finasteride (PROSCAR) 5 MG tablet TAKE 1 TABLET EVERY DAY 90 tablet 3  . fluticasone (FLONASE) 50 MCG/ACT nasal spray Place 2 sprays into both nostrils at bedtime. PRN 16 g 2  . furosemide (LASIX) 40 MG tablet TAKE 1 TABLET EVERY DAY 90 tablet 1  . levothyroxine (SYNTHROID, LEVOTHROID) 200 MCG tablet TAKE 1 TABLET EVERY DAY BEFORE BREAKFAST 90 tablet 3  . losartan (COZAAR) 50 MG tablet Take 1 tablet (50 mg total) by mouth daily. 90 tablet 3  . metoprolol succinate (TOPROL-XL) 50 MG 24 hr tablet TAKE 1 TABLET EVERY DAY WITH FOOD  OR  IMMEDIATELY FOLLOWING A MEAL 90 tablet 3  . OVER THE COUNTER MEDICATION daily. Vitamin A to Z 50 plus    . polyethylene glycol (MIRALAX / GLYCOLAX) packet Take 17 g by mouth daily.    . ranitidine (ZANTAC) 150 MG tablet TAKE 1 TABLET TWICE DAILY 180 tablet 1  . spironolactone (ALDACTONE) 25 MG tablet TAKE 1 TABLET EVERY DAY 90 tablet 1  . warfarin (COUMADIN) 5 MG tablet TAKE 1 AND 1/2 TABLETS EVERY DAY  OR AS DIRECTED 145 tablet 1  . albuterol (PROVENTIL HFA;VENTOLIN HFA) 108 (90 Base) MCG/ACT inhaler Inhale 2 puffs into the lungs every 6 (six) hours as needed for wheezing or shortness of breath. (Patient not taking: Reported on 02/09/2018) 1 Inhaler 3  . albuterol (PROVENTIL) (2.5 MG/3ML) 0.083% nebulizer solution Take 3 mLs (2.5 mg total) by nebulization every 6 (six) hours as needed for wheezing or shortness of breath. (Patient not taking: Reported on 02/09/2018) 75 mL 12  . fluticasone furoate-vilanterol (BREO ELLIPTA) 100-25 MCG/INH AEPB Inhale 1 puff into the lungs daily. Rinse mouth with water after each use (Patient not taking: Reported on 02/09/2018) 1 each 0  .  Magnesium 400 MG TABS Take 1 tablet by mouth daily.    Marland Kitchen omeprazole (PRILOSEC) 20 MG capsule Take 1 capsule (20 mg total) by mouth 2 (two) times daily as needed. (Patient not taking: Reported on 02/09/2018) 180 capsule 1  . silver sulfADIAZINE (SILVADENE) 1 % cream Apply 1 application topically daily. (Patient not taking: Reported on 02/09/2018) 50 g 2   No current facility-administered medications for this visit.     REVIEW OF SYSTEMS:    10 Point review of Systems was done is negative except as noted above.  PHYSICAL EXAMINATION:  . Vitals:   02/09/18 1258  BP: 130/87  Pulse: (!) 58  Resp: 18  Temp: 98 F (36.7 C)  SpO2: 97%   Filed Weights   02/09/18 1258  Weight: 254 lb 11.2 oz (115.5 kg)   .Body mass index is 37.07 kg/m.  GENERAL:alert, in no acute distress and comfortable SKIN: no acute rashes, no significant lesions EYES: conjunctiva are pink and non-injected, sclera anicteric OROPHARYNX: MMM, no exudates, no oropharyngeal erythema or ulceration NECK: supple, no JVD LYMPH:  no palpable lymphadenopathy in the cervical, axillary or inguinal regions LUNGS: clear to auscultation b/l with normal respiratory effort HEART: regular rate & rhythm ABDOMEN:  normoactive bowel sounds , non tender, not distended. Extremity: 2+ pedal edema PSYCH: alert & oriented x 3 with fluent speech NEURO: no focal motor/sensory deficits  LABORATORY DATA:  I have reviewed the data as listed  . CBC Latest Ref Rng & Units 02/09/2018 12/09/2017 09/01/2017  WBC 4.0 - 10.3 K/uL 12.7(H) 11.9(H) 13.0(H)  Hemoglobin 13.0 - 17.1 g/dL 15.2 14.7 13.8  Hematocrit  38.4 - 49.9 % 48.3 45.8 42.3  Platelets 140 - 400 K/uL 559(H) 649(H) 677(H)    . CMP Latest Ref Rng & Units 02/09/2018 12/09/2017 09/01/2017  Glucose 70 - 99 mg/dL 105(H) 102(H) 129(H)  BUN 8 - 23 mg/dL 24(H) 22 21  Creatinine 0.61 - 1.24 mg/dL 1.09 1.04 1.14  Sodium 135 - 145 mmol/L 141 139 137  Potassium 3.5 - 5.1 mmol/L 4.4 5.2 5.0    Chloride 98 - 111 mmol/L 104 100 101  CO2 22 - 32 mmol/L 31 33(H) 29  Calcium 8.9 - 10.3 mg/dL 9.8 9.8 9.6  Total Protein 6.5 - 8.1 g/dL 7.1 6.8 6.6  Total Bilirubin 0.3 - 1.2 mg/dL 0.9 1.1 1.0  Alkaline Phos 38 - 126 U/L 73 - -  AST 15 - 41 U/L 32 26 30  ALT 0 - 44 U/L 35 21 26     RADIOGRAPHIC STUDIES: I have personally reviewed the radiological images as listed and agreed with the findings in the report. No results found.  ASSESSMENT & PLAN:   77 y.o. male with  1. Persistent Thrombocytosis - r/o essential thrombocytosis PLAN -Discussed patient's most recent labs from 12/09/17, [place] at 9.7k, PLT at 649k  -Platelets have been >600k since October 2018, over 400k since at least August 2016. Some associated leukocytosis. No history of splenectomy.  -Discussed the concern for essential thrombocytosis and the corresponding increased risk of blood clots  -No previous h/o VTE, but significant cardiac risk factors. -Pt is already on 81mg  aspirin and coumadin which is protective from his thrombocytosis . -Will order blood tests today including clonal studies  -Will see the pt back in 3 weeks   2. . Patient Active Problem List   Diagnosis Date Noted  . Leukocytosis 12/29/2017  . Senile purpura ([place]) 09/01/2017  . Thrombocythemia ([place]) 06/02/2017  . Emphysema of lung ([place]) 06/01/2017  . Tortuous aorta ([facility]) 06/01/2017  . ASHD/CABG x 3V (11/2013) 02/06/2015  . Encounter for monitoring Coumadin therapy 02/06/2015  . Cardiac pacemaker/AICD (01/2014) 02/06/2015  . Morbid obesity ([place]) 01/03/2015  . HTN (hypertension) 01/02/2015  . Mixed hyperlipidemia 01/02/2015  . Prediabetes 01/02/2015  . Vitamin D deficiency 01/02/2015  . GERD  01/02/2015  . BPH (benign prostatic hyperplasia) 01/02/2015  . Medication management 01/02/2015  . Atrial fibrillation ([place]) 01/02/2015  . Hypothyroidism 01/02/2015  . OSA (obstructive sleep apnea) 01/02/2015  -continue f/u with PCP to optimize  atherosclerotic risk factors.  . Orders Placed This Encounter  Procedures  . CBC with Differential/Platelet    Standing Status:   Future    Number of Occurrences:   1    Standing Expiration Date:   03/16/2019  . CMP (Reader only)    Standing Status:   Future    Number of Occurrences:   1    Standing Expiration Date:   02/10/2019  . JAK2 (including V617F and Exon 12), MPL, and CALR-Next Generation Sequencing    Standing Status:   Future    Number of Occurrences:   1    Standing Expiration Date:   02/09/2019  . BCR ABL1 FISH ([employer])    Standing Status:   Future    Number of Occurrences:   1    Standing Expiration Date:   02/10/2019    Labs today RTC with Dr Irene Limbo in 3 weeks    All of the patients questions were answered with apparent satisfaction. The patient knows to call the clinic with any problems, questions or  concerns.  The total time spent in the appt was 45 minutes and more than 50% was on counseling and direct patient cares.    Sullivan Lone MD MS AAHIVMS [facility] Hematology/Oncology Physician [facility]  (Office):       [phone] (Work cell):  [phone] (Fax):           [phone]  02/09/2018 2:06 PM  I, [place], am acting as a scribe for Dr. Irene Limbo  .I have reviewed the above documentation for accuracy and completeness, and I agree with the above. Brunetta Genera MD

## 2018-02-09 ENCOUNTER — Encounter: Payer: Self-pay | Admitting: Hematology

## 2018-02-09 ENCOUNTER — Inpatient Hospital Stay: Payer: Medicare Other | Attending: Hematology | Admitting: Hematology

## 2018-02-09 ENCOUNTER — Inpatient Hospital Stay: Payer: Medicare Other

## 2018-02-09 VITALS — BP 130/87 | HR 58 | Temp 98.0°F | Resp 18 | Ht 69.5 in | Wt 254.7 lb

## 2018-02-09 DIAGNOSIS — I4891 Unspecified atrial fibrillation: Secondary | ICD-10-CM

## 2018-02-09 DIAGNOSIS — R7989 Other specified abnormal findings of blood chemistry: Secondary | ICD-10-CM | POA: Diagnosis not present

## 2018-02-09 DIAGNOSIS — I10 Essential (primary) hypertension: Secondary | ICD-10-CM | POA: Diagnosis not present

## 2018-02-09 DIAGNOSIS — D473 Essential (hemorrhagic) thrombocythemia: Secondary | ICD-10-CM

## 2018-02-09 DIAGNOSIS — Z7901 Long term (current) use of anticoagulants: Secondary | ICD-10-CM | POA: Diagnosis not present

## 2018-02-09 DIAGNOSIS — Z87891 Personal history of nicotine dependence: Secondary | ICD-10-CM

## 2018-02-09 DIAGNOSIS — Z79899 Other long term (current) drug therapy: Secondary | ICD-10-CM | POA: Diagnosis not present

## 2018-02-09 DIAGNOSIS — Z7982 Long term (current) use of aspirin: Secondary | ICD-10-CM | POA: Diagnosis not present

## 2018-02-09 DIAGNOSIS — D75839 Thrombocytosis, unspecified: Secondary | ICD-10-CM

## 2018-02-09 DIAGNOSIS — D72829 Elevated white blood cell count, unspecified: Secondary | ICD-10-CM

## 2018-02-09 LAB — CMP (CANCER CENTER ONLY)
ALBUMIN: 4.4 g/dL (ref 3.5–5.0)
ALT: 35 U/L (ref 0–44)
AST: 32 U/L (ref 15–41)
Alkaline Phosphatase: 73 U/L (ref 38–126)
Anion gap: 6 (ref 5–15)
BUN: 24 mg/dL — AB (ref 8–23)
CHLORIDE: 104 mmol/L (ref 98–111)
CO2: 31 mmol/L (ref 22–32)
CREATININE: 1.09 mg/dL (ref 0.61–1.24)
Calcium: 9.8 mg/dL (ref 8.9–10.3)
GFR, Est AFR Am: >60 mL/min (ref >60)
Glucose, Bld: 105 mg/dL — ABNORMAL HIGH (ref 70–99)
POTASSIUM: 4.4 mmol/L (ref 3.5–5.1)
SODIUM: 141 mmol/L (ref 135–145)
Total Bilirubin: 0.9 mg/dL (ref 0.3–1.2)
Total Protein: 7.1 g/dL (ref 6.5–8.1)

## 2018-02-09 LAB — CBC WITH DIFFERENTIAL/PLATELET
BASOS ABS: 0.2 10*3/uL — AB (ref 0.0–0.1)
Basophils Relative: 1 %
Eosinophils Absolute: 0.3 10*3/uL (ref 0.0–0.5)
Eosinophils Relative: 2 %
HEMATOCRIT: 48.3 % (ref 38.4–49.9)
HEMOGLOBIN: 15.2 g/dL (ref 13.0–17.1)
LYMPHS ABS: 1.8 10*3/uL (ref 0.9–3.3)
LYMPHS PCT: 14 %
MCH: 28 pg (ref 27.2–33.4)
MCHC: 31.5 g/dL — ABNORMAL LOW (ref 32.0–36.0)
MCV: 89 fL (ref 79.3–98.0)
Monocytes Absolute: 0.5 10*3/uL (ref 0.1–0.9)
Monocytes Relative: 4 %
NEUTROS PCT: 79 %
Neutro Abs: 10 10*3/uL — ABNORMAL HIGH (ref 1.5–6.5)
PLATELETS: 559 10*3/uL — AB (ref 140–400)
RBC: 5.43 MIL/uL (ref 4.20–5.82)
RDW: 20.5 % — ABNORMAL HIGH (ref 11.0–14.6)
WBC: 12.7 10*3/uL — AB (ref 4.0–10.3)

## 2018-02-25 LAB — BCR ABL1 FISH (GENPATH)

## 2018-02-25 LAB — JAK2 (INCLUDING V617F AND EXON 12), MPL,& CALR-NEXT GEN SEQ

## 2018-02-26 DIAGNOSIS — I255 Ischemic cardiomyopathy: Secondary | ICD-10-CM | POA: Diagnosis not present

## 2018-02-26 DIAGNOSIS — I2510 Atherosclerotic heart disease of native coronary artery without angina pectoris: Secondary | ICD-10-CM | POA: Diagnosis not present

## 2018-02-26 DIAGNOSIS — I480 Paroxysmal atrial fibrillation: Secondary | ICD-10-CM | POA: Diagnosis not present

## 2018-03-03 ENCOUNTER — Encounter: Payer: Self-pay | Admitting: Hematology

## 2018-03-03 ENCOUNTER — Inpatient Hospital Stay: Payer: Medicare Other | Attending: Hematology | Admitting: Hematology

## 2018-03-03 VITALS — BP 146/66 | HR 60 | Temp 98.3°F | Resp 18 | Ht 69.5 in | Wt 262.0 lb

## 2018-03-03 DIAGNOSIS — J449 Chronic obstructive pulmonary disease, unspecified: Secondary | ICD-10-CM

## 2018-03-03 DIAGNOSIS — Z87891 Personal history of nicotine dependence: Secondary | ICD-10-CM | POA: Diagnosis not present

## 2018-03-03 DIAGNOSIS — I10 Essential (primary) hypertension: Secondary | ICD-10-CM | POA: Diagnosis not present

## 2018-03-03 DIAGNOSIS — Z79899 Other long term (current) drug therapy: Secondary | ICD-10-CM | POA: Diagnosis not present

## 2018-03-03 DIAGNOSIS — D473 Essential (hemorrhagic) thrombocythemia: Secondary | ICD-10-CM | POA: Diagnosis not present

## 2018-03-03 DIAGNOSIS — Z7982 Long term (current) use of aspirin: Secondary | ICD-10-CM | POA: Diagnosis not present

## 2018-03-03 DIAGNOSIS — Z1589 Genetic susceptibility to other disease: Secondary | ICD-10-CM

## 2018-03-03 MED ORDER — HYDROXYUREA 500 MG PO CAPS: 500.0000 mg | ORAL_CAPSULE | Freq: Every day | ORAL | 1 refills | Status: DC

## 2018-03-03 NOTE — Progress Notes (Signed)
HEMATOLOGY/ONCOLOGY CONSULTATION NOTE  Date of Service: 03/03/2018  Patient Care Team: [name], MD as PCP - General (Internal Medicine)  CHIEF COMPLAINTS/PURPOSE OF CONSULTATION:  Thrombocytosis   HISTORY OF PRESENTING ILLNESS:   [name] [name] is a wonderful 77 y.o. male who has been referred to [place] by Dr. [name]  for evaluation and management of Thrombocytosis. He is accompanied today by his wife. The pt reports that he is doing well overall.   The pt reports that he has never had any blood clots.  He has atrial fibrillation and has taken Coumadin since 2015. He had heart surgery in 2015, and had his ICD placed in 2016. The pt also takes 81g aspirin daily.   The pt notes that he has not been aware of his high platelets previously, but only as of his most recent labs. He denies any recent major bleeds, surgeries, and other trauma. He denies having a splenectomy at any time as well.   The pt notes that his right kidney gets sore from time to time when he doesn't stay well hydrated. He also notes that when he urinates at these times, his urine is very dark. The pt adds that he had kidney stones one time. The pt is also taking Lasix and notes that he urinates frequently.   The pt adds that his gums have been very sore, even to touch from his tongue.  The pt notes that he does not want to use a CPAP after a bad experience with a previous sleep study that did diagnose him with sleep apnea. He notes that he falls asleep several times a day as well, falling asleep easily when he sits down, and denying falling asleep while driving. He also notes some increased difficulty remembering.   Most recent lab results (12/09/17) of CBC is as follows: all values are WNL except for WBC at 11.9k, RDW at 18.4, PLT at 649k, ANC at 9.7k.  On review of systems, pt reports good energy levels, falling asleep several times a day, staying active, and denies recent surgery, recent trauma,  bleeding, unexpected weight loss, pain along the spine, abdominal pains, and any other symptoms.   On Family Hx the pt reports prostate cancer.   Interval History:   [name] [name] returns today for management and evaluation of his newly diagnosed Essential thrombocytosis. The patient's last visit with [place] was on 02/09/18. He is accompanied today by his wife. The pt reports that he is doing well overall.   The pt reports that he has not developed any new concerns in the brief interim. The pt notes that his INR levels have not fluctuated much.   The pt notes that he was diagnosed with COPD/emphysema this year.   Lab results (02/09/18) of CBC w/diff, CMP is as follows: all values are WNL except for WBC at 12.7k, MCHC at 31.5, RDW at 20.5, PLT at 559k, ANC at 10.0k, Basophils abs at 200, Glucose at 105, BUN at 24.  On review of systems, pt reports good energy levels, staying active, stable ankle swelling, and denies mouth sores, skin rashes, fevers, chills, night sweats, abdominal pains, and any other symptoms.   MEDICAL HISTORY:  Past Medical History:  Diagnosis Date  . A-fib ([place]) 01/2014  . AICD (automatic cardioverter/defibrillator) present    [employer]  . Arthritis   . ASHD (arteriosclerotic heart disease) 2015   s/p CABG  . Cataract    s/p left  . COPD (chronic obstructive  pulmonary disease) ([place])    by CXR, pt unsure of this  . GERD (gastroesophageal reflux disease)   . Gout 2014  . Hyperlipidemia   . Hypertension   . Hypothyroidism   . LBBB (left bundle branch block)   . Pre-diabetes   . Prediabetes 01/2014  . Presence of permanent cardiac pacemaker   . Sleep apnea    no CPAP  . Thyroid disease    hypothyroid    SURGICAL HISTORY: Past Surgical History:  Procedure Laterality Date  . CARDIAC DEFIBRILLATOR PLACEMENT  07/2014  . CATARACT EXTRACTION W/ INTRAOCULAR LENS IMPLANT Left   . CORONARY ARTERY BYPASS GRAFT  11/2013  . lumb laminectomy  [phone]   x  3  . LUMBAR FUSION  2013  . NASAL SEPTOPLASTY W/ TURBINOPLASTY Bilateral 01/30/2016   Procedure: NASAL SEPTOPLASTY WITH BILATERAL TURBINATE REDUCTION;  Surgeon: Rozetta Nunnery, MD;  Location: [facility];  Service: ENT;  Laterality: Bilateral;  . UVULECTOMY N/A 01/30/2016   Procedure: UVULECTOMY;  Surgeon: Rozetta Nunnery, MD;  [facility];  Service: ENT;  Laterality: N/A;    SOCIAL HISTORY: Social History   Socioeconomic History  . Marital status: Married    Spouse name: [name]   . Number of children: Not on file  . Years of education: 1  . Highest education level: Not on file  Occupational History  . Occupation: Retired  Scientific laboratory technician  . Financial resource strain: Not on file  . Food insecurity:    Worry: Not on file    Inability: Not on file  . Transportation needs:    Medical: Not on file    Non-medical: Not on file  Tobacco Use  . Smoking status: Former Smoker    Packs/day: 2.00    Years: 25.00    Pack years: 50.00    Types: Cigarettes    Last attempt to quit: 05/20/1983    Years since quitting: 34.8  . Smokeless tobacco: Never Used  Substance and Sexual Activity  . Alcohol use: Not Currently    Alcohol/week: 0.0 standard drinks  . Drug use: No  . Sexual activity: Not on file  Lifestyle  . Physical activity:    Days per week: Not on file    Minutes per session: Not on file  . Stress: Not on file  Relationships  . Social connections:    Talks on phone: Not on file    Gets together: Not on file    Attends religious service: Not on file    Active member of club or organization: Not on file    Attends meetings of clubs or organizations: Not on file    Relationship status: Not on file  . Intimate partner violence:    Fear of current or ex partner: Not on file    Emotionally abused: Not on file    Physically abused: Not on file    Forced sexual activity: Not on file  Other Topics Concern  . Not on file  Social History Narrative   Lives with wife, [name]     Caffeine use: daily (Coffee)    FAMILY HISTORY: Family History  Problem Relation Age of Onset  . Alzheimer's disease Mother   . Mental illness Mother   . Depression Mother   . Heart disease Father 52       had 5 MI's & died 81 yo  . Alcohol abuse Son   . Stroke Maternal Grandmother     ALLERGIES:  is allergic to  other.  MEDICATIONS:  Current Outpatient Medications  Medication Sig Dispense Refill  . acetaminophen (TYLENOL) 500 MG tablet Take 500 mg by mouth every 6 (six) hours as needed for mild pain.     Marland Kitchen albuterol (PROVENTIL HFA;VENTOLIN HFA) 108 (90 Base) MCG/ACT inhaler Inhale 2 puffs into the lungs every 6 (six) hours as needed for wheezing or shortness of breath. (Patient not taking: Reported on 02/09/2018) 1 Inhaler 3  . albuterol (PROVENTIL) (2.5 MG/3ML) 0.083% nebulizer solution Take 3 mLs (2.5 mg total) by nebulization every 6 (six) hours as needed for wheezing or shortness of breath. (Patient not taking: Reported on 02/09/2018) 75 mL 12  . allopurinol (ZYLOPRIM) 300 MG tablet TAKE 1 TABLET EVERY DAY 90 tablet 3  . aspirin EC 81 MG tablet Take 81 mg by mouth daily.    Marland Kitchen atorvastatin (LIPITOR) 40 MG tablet TAKE 1 TABLET EVERY DAY 90 tablet 1  . Black Pepper-Turmeric (TURMERIC COMPLEX/BLACK PEPPER PO) Take 900 mg by mouth daily.     . Cholecalciferol (VITAMIN D PO) Take 15,000 Units by mouth daily.     . diphenhydrAMINE (BENADRYL) 25 MG tablet Take 25 mg by mouth at bedtime.    . docusate sodium (COLACE) 100 MG capsule Take 100 mg by mouth 2 (two) times daily. 1 tablet in the morning and 1 tablet at bedtime    . doxazosin (CARDURA) 4 MG tablet TAKE 1 TABLET EVERY DAY 90 tablet 3  . finasteride (PROSCAR) 5 MG tablet TAKE 1 TABLET EVERY DAY 90 tablet 3  . fluticasone (FLONASE) 50 MCG/ACT nasal spray Place 2 sprays into both nostrils at bedtime. PRN 16 g 2  . fluticasone furoate-vilanterol (BREO ELLIPTA) 100-25 MCG/INH AEPB Inhale 1 puff into the lungs daily. Rinse mouth with  water after each use (Patient not taking: Reported on 02/09/2018) 1 each 0  . furosemide (LASIX) 40 MG tablet TAKE 1 TABLET EVERY DAY 90 tablet 1  . hydroxyurea (HYDREA) 500 MG capsule Take 1 capsule (500 mg total) by mouth daily. May take with food to minimize GI side effects. 30 capsule 1  . levothyroxine (SYNTHROID, LEVOTHROID) 200 MCG tablet TAKE 1 TABLET EVERY DAY BEFORE BREAKFAST 90 tablet 3  . losartan (COZAAR) 50 MG tablet Take 1 tablet (50 mg total) by mouth daily. 90 tablet 3  . Magnesium 400 MG TABS Take 1 tablet by mouth daily.    . metoprolol succinate (TOPROL-XL) 50 MG 24 hr tablet TAKE 1 TABLET EVERY DAY WITH FOOD  OR  IMMEDIATELY FOLLOWING A MEAL 90 tablet 3  . omeprazole (PRILOSEC) 20 MG capsule Take 1 capsule (20 mg total) by mouth 2 (two) times daily as needed. (Patient not taking: Reported on 02/09/2018) 180 capsule 1  . OVER THE COUNTER MEDICATION daily. Vitamin A to Z 50 plus    . polyethylene glycol (MIRALAX / GLYCOLAX) packet Take 17 g by mouth daily.    . ranitidine (ZANTAC) 150 MG tablet TAKE 1 TABLET TWICE DAILY 180 tablet 1  . silver sulfADIAZINE (SILVADENE) 1 % cream Apply 1 application topically daily. (Patient not taking: Reported on 02/09/2018) 50 g 2  . spironolactone (ALDACTONE) 25 MG tablet TAKE 1 TABLET EVERY DAY 90 tablet 1  . warfarin (COUMADIN) 5 MG tablet TAKE 1 AND 1/2 TABLETS EVERY DAY  OR AS DIRECTED 145 tablet 1   No current facility-administered medications for this visit.     REVIEW OF SYSTEMS:    A 10+ POINT REVIEW OF SYSTEMS WAS OBTAINED including  neurology, dermatology, psychiatry, cardiac, respiratory, lymph, extremities, GI, GU, Musculoskeletal, constitutional, breasts, reproductive, HEENT.  All pertinent positives are noted in the HPI.  All others are negative.   PHYSICAL EXAMINATION: . Vitals:   03/03/18 1500  BP: (!) 146/66  Pulse: 60  Resp: 18  Temp: 98.3 F (36.8 C)  SpO2: 96%   Filed Weights   03/03/18 1500  Weight: 262 lb  (118.8 kg)   .Body mass index is 38.14 kg/m.  GENERAL:alert, in no acute distress and comfortable SKIN: no acute rashes, no significant lesions EYES: conjunctiva are pink and non-injected, sclera anicteric OROPHARYNX: MMM, no exudates, no oropharyngeal erythema or ulceration NECK: supple, no JVD LYMPH:  no palpable lymphadenopathy in the cervical, axillary or inguinal regions LUNGS: clear to auscultation b/l with normal respiratory effort HEART: regular rate & rhythm ABDOMEN:  normoactive bowel sounds , non tender, not distended. No palpable hepatosplenomegaly.  Extremity: 2+ pedal edema PSYCH: alert & oriented x 3 with fluent speech NEURO: no focal motor/sensory deficits    LABORATORY DATA:  I have reviewed the data as listed  . CBC Latest Ref Rng & Units 02/09/2018 12/09/2017 09/01/2017  WBC 4.0 - 10.3 K/uL 12.7(H) 11.9(H) 13.0(H)  Hemoglobin 13.0 - 17.1 g/dL 15.2 14.7 13.8  Hematocrit 38.4 - 49.9 % 48.3 45.8 42.3  Platelets 140 - 400 K/uL 559(H) 649(H) 677(H)    . CMP Latest Ref Rng & Units 02/09/2018 12/09/2017 09/01/2017  Glucose 70 - 99 mg/dL 105(H) 102(H) 129(H)  BUN 8 - 23 mg/dL 24(H) 22 21  Creatinine 0.61 - 1.24 mg/dL 1.09 1.04 1.14  Sodium 135 - 145 mmol/L 141 139 137  Potassium 3.5 - 5.1 mmol/L 4.4 5.2 5.0  Chloride 98 - 111 mmol/L 104 100 101  CO2 22 - 32 mmol/L 31 33(H) 29  Calcium 8.9 - 10.3 mg/dL 9.8 9.8 9.6  Total Protein 6.5 - 8.1 g/dL 7.1 6.8 6.6  Total Bilirubin 0.3 - 1.2 mg/dL 0.9 1.1 1.0  Alkaline Phos 38 - 126 U/L 73 - -  AST 15 - 41 U/L 32 26 30  ALT 0 - 44 U/L 35 21 26   02/09/18 JAK2 Mutation Study:    02/09/18 BCR ABL FISH:    RADIOGRAPHIC STUDIES: I have personally reviewed the radiological images as listed and agreed with the findings in the report. No results found.  ASSESSMENT & PLAN:   77 y.o. male with  1. Persistent Thrombocytosis -due to Newly Diagnosed Jak2 V617F mutation +ve Essential thrombocytosis PLAN: -Patient's PLT at  649k upon presentation 12/09/17  -Platelets have been >600k since October 2018, over 400k since at least August 2016. Some associated leukocytosis. No history of splenectomy.  -No previous h/o VTE, but significant cardiac risk factors. -Discussed pt labwork from 02/09/18; WBC at 12.7k, PLT at 559k, ANC at 10.0k -Discussed that the 02/09/18 BCR ABL FISH revealed no evidence of BCR/ABL rearrangement -Discussed the 02/09/18 JAK2 mutation study which revealed the JAK 2 mutation [id number] at 75% allele frequency consistent with Essential thrombocytosis. -Provided supplemental information about Essential Thrombocythemia -Patient's aspirin and Warfarin has been protective against vascular events and -Discussed the risk of developing myelofibrosis as a result of ET, which would be confirmed with a BM biopsy and is not overtly indicated at this time.  -Discussed the small but noted risk of transformation from ET to AML -Discussed the increased risk of blood clots associated with thrombocytosis and the recommendation to pursue Hydroxyurea to mitigate this risk  -Pt prefers to begin  Hydroxyurea, will start pt at 500mg  Hydroxyurea each day to be taken with food -Goal for PLT 200-400k -Recommended sun protection strategies as Hydroxyurea can increase sensitivity of skin, and that he should watch for mouth sores as well  -Recommend that the pt recheck PT/INR, 3 days after beginning Hydroxyurea, which he will coordinate with his next coumadin clinic visit at the end of the month   -Will see the pt back in 5 weeks with repeat labs and expectation of Hydroxyurea dose adjustment  -given patient education material from L[facility] for his education regarding this condition of ET. 2. . Patient Active Problem List   Diagnosis Date Noted  . Leukocytosis 12/29/2017  . Senile purpura ([place]) 09/01/2017  . Thrombocythemia ([place]) 06/02/2017  . Emphysema of lung ([place]) 06/01/2017  . Tortuous aorta ([facility]) 06/01/2017  . ASHD/CABG x 3V  (11/2013) 02/06/2015  . Encounter for monitoring Coumadin therapy 02/06/2015  . Cardiac pacemaker/AICD (01/2014) 02/06/2015  . Morbid obesity ([place]) 01/03/2015  . HTN (hypertension) 01/02/2015  . Mixed hyperlipidemia 01/02/2015  . Prediabetes 01/02/2015  . Vitamin D deficiency 01/02/2015  . GERD  01/02/2015  . BPH (benign prostatic hyperplasia) 01/02/2015  . Medication management 01/02/2015  . Atrial fibrillation ([place]) 01/02/2015  . Hypothyroidism 01/02/2015  . OSA (obstructive sleep apnea) 01/02/2015  -continue f/u with PCP to optimize atherosclerotic risk factors.  . Orders Placed This Encounter  Procedures  . CBC with Differential/Platelet    Standing Status:   Future    Standing Expiration Date:   04/07/2019  . CMP ([place] only)    Standing Status:   Future    Standing Expiration Date:   03/04/2019  . Lactate dehydrogenase    Standing Status:   Future    Standing Expiration Date:   03/04/2019    RTC with Dr Irene Limbo in 5 weeks with labs     All of the patients questions were answered with apparent satisfaction. The patient knows to call the clinic with any problems, questions or concerns.  The total time spent in the appt was 40 minutes and more than 50% was on counseling and direct patient cares.    Sullivan Lone MD MS AAHIVMS [name] Hematology/Oncology Physician [facility]  (Office):       [phone] (Work cell):  [phone] (Fax):           [phone]  03/03/2018 3:55 PM  I, Baldwin [place], am acting as a scribe for Dr. Irene Limbo  .I have reviewed the above documentation for accuracy and completeness, and I agree with the above. Brunetta Genera MD

## 2018-03-09 DIAGNOSIS — Z7901 Long term (current) use of anticoagulants: Secondary | ICD-10-CM | POA: Diagnosis not present

## 2018-03-09 DIAGNOSIS — I255 Ischemic cardiomyopathy: Secondary | ICD-10-CM | POA: Diagnosis not present

## 2018-03-09 DIAGNOSIS — Z95810 Presence of automatic (implantable) cardiac defibrillator: Secondary | ICD-10-CM | POA: Diagnosis not present

## 2018-03-09 DIAGNOSIS — I480 Paroxysmal atrial fibrillation: Secondary | ICD-10-CM | POA: Diagnosis not present

## 2018-03-16 DIAGNOSIS — Z7901 Long term (current) use of anticoagulants: Secondary | ICD-10-CM | POA: Diagnosis not present

## 2018-03-16 DIAGNOSIS — Z95810 Presence of automatic (implantable) cardiac defibrillator: Secondary | ICD-10-CM | POA: Diagnosis not present

## 2018-03-16 DIAGNOSIS — I255 Ischemic cardiomyopathy: Secondary | ICD-10-CM | POA: Diagnosis not present

## 2018-03-16 DIAGNOSIS — I480 Paroxysmal atrial fibrillation: Secondary | ICD-10-CM | POA: Diagnosis not present

## 2018-03-22 ENCOUNTER — Encounter: Payer: Self-pay | Admitting: Internal Medicine

## 2018-03-22 ENCOUNTER — Ambulatory Visit (INDEPENDENT_AMBULATORY_CARE_PROVIDER_SITE_OTHER): Payer: Medicare Other | Admitting: Internal Medicine

## 2018-03-22 VITALS — BP 140/78 | HR 60 | Temp 97.3°F | Resp 18 | Ht 69.0 in | Wt 256.2 lb

## 2018-03-22 DIAGNOSIS — N138 Other obstructive and reflux uropathy: Secondary | ICD-10-CM

## 2018-03-22 DIAGNOSIS — I714 Abdominal aortic aneurysm, without rupture, unspecified: Secondary | ICD-10-CM

## 2018-03-22 DIAGNOSIS — M109 Gout, unspecified: Secondary | ICD-10-CM | POA: Diagnosis not present

## 2018-03-22 DIAGNOSIS — E782 Mixed hyperlipidemia: Secondary | ICD-10-CM

## 2018-03-22 DIAGNOSIS — I480 Paroxysmal atrial fibrillation: Secondary | ICD-10-CM

## 2018-03-22 DIAGNOSIS — R7309 Other abnormal glucose: Secondary | ICD-10-CM | POA: Diagnosis not present

## 2018-03-22 DIAGNOSIS — E559 Vitamin D deficiency, unspecified: Secondary | ICD-10-CM | POA: Diagnosis not present

## 2018-03-22 DIAGNOSIS — Z136 Encounter for screening for cardiovascular disorders: Secondary | ICD-10-CM

## 2018-03-22 DIAGNOSIS — G4733 Obstructive sleep apnea (adult) (pediatric): Secondary | ICD-10-CM | POA: Diagnosis not present

## 2018-03-22 DIAGNOSIS — K219 Gastro-esophageal reflux disease without esophagitis: Secondary | ICD-10-CM | POA: Diagnosis not present

## 2018-03-22 DIAGNOSIS — Z125 Encounter for screening for malignant neoplasm of prostate: Secondary | ICD-10-CM

## 2018-03-22 DIAGNOSIS — Z1211 Encounter for screening for malignant neoplasm of colon: Secondary | ICD-10-CM

## 2018-03-22 DIAGNOSIS — E039 Hypothyroidism, unspecified: Secondary | ICD-10-CM | POA: Diagnosis not present

## 2018-03-22 DIAGNOSIS — Z79899 Other long term (current) drug therapy: Secondary | ICD-10-CM

## 2018-03-22 DIAGNOSIS — Z1212 Encounter for screening for malignant neoplasm of rectum: Secondary | ICD-10-CM

## 2018-03-22 DIAGNOSIS — I2510 Atherosclerotic heart disease of native coronary artery without angina pectoris: Secondary | ICD-10-CM

## 2018-03-22 DIAGNOSIS — I10 Essential (primary) hypertension: Secondary | ICD-10-CM | POA: Diagnosis not present

## 2018-03-22 DIAGNOSIS — Z8249 Family history of ischemic heart disease and other diseases of the circulatory system: Secondary | ICD-10-CM

## 2018-03-22 DIAGNOSIS — N401 Enlarged prostate with lower urinary tract symptoms: Secondary | ICD-10-CM | POA: Diagnosis not present

## 2018-03-22 DIAGNOSIS — Z87891 Personal history of nicotine dependence: Secondary | ICD-10-CM

## 2018-03-22 NOTE — Patient Instructions (Signed)

## 2018-03-22 NOTE — Progress Notes (Signed)
`[name] [name]   [name], M.D.     [name] [name]. Silverio [name], P.A.-C [name], [place]                [address] [place], [place] [address] Telephone [phone] Telefax [phone] Annual  Screening/Preventative Visit  & Comprehensive Evaluation & Examination     This very nice 77 y.o. MWM  presents for a Screening /Preventative Visit & comprehensive evaluation and management of multiple medical co-morbidities.  Patient has been followed for HTN, ASHD/CABG/cAfib,  OSA, HLD, Prediabetes and Vitamin D Deficiency. Patient declines CPAP for his OSA due to prior "bad" experience. He has Gout controlled on his meds. His GERD is likewise controlled on his meds. Patient has Thrombocythemia & is followed by Dr. Irene Limbo     HTN predates since 16 (age 41 yo).  He did well until age 41 in 18, undergoing a CABG w/po Afib & failed CV x 2 . Then in 2016, he had a BVPM/AICD/Defib implanted and he's followed at [facility]. Patient's BP has been controlled at home.  Today's BP was initially elevated & rechecked at goal - 140/78.  Patient denies any cardiac symptoms as chest pain, palpitations, shortness of breath, dizziness or ankle swelling.     Patient's hyperlipidemia is controlled with diet and medications. Patient denies myalgias or other medication SE's. Current  lipids are at goal with sl elevated Trig's: Lab Results  Component Value Date   CHOL 141 03/22/2018   HDL 24 (L) 03/22/2018   LDLCALC 90 03/22/2018   TRIG 161 (H) 03/22/2018   CHOLHDL 5.9 (H) 03/22/2018      Patient has hx/o prediabetes since    and patient denies reactive hypoglycemic symptoms, visual blurring, diabetic polys or paresthesias. Current  A1c is not at goal: Lab Results  Component Value Date   HGBA1C 5.9 (H) 03/22/2018       Finally, patient has history of Vitamin D Deficiency ("39" / 2016) and last vitamin D was at  goal: Lab Results  Component Value Date   VD25OH 83 03/22/2018   Current Outpatient Medications on File Prior to Visit  Medication Sig  . acetaminophen (TYLENOL) 500 MG tablet Take 500 mg by mouth every 6 (six) hours as needed for mild pain.   Marland Kitchen albuterol (PROVENTIL HFA;VENTOLIN HFA) 108 (90 Base) MCG/ACT inhaler Inhale 2 puffs into the lungs every 6 (six) hours as needed for wheezing or shortness of breath.  Marland Kitchen albuterol (PROVENTIL) (2.5 MG/3ML) 0.083% nebulizer solution Take 3 mLs (2.5 mg total) by nebulization every 6 (six) hours as needed for wheezing or shortness of breath.  . allopurinol (ZYLOPRIM) 300 MG tablet TAKE 1 TABLET EVERY DAY  . aspirin EC 81 MG tablet Take 81 mg by mouth daily.  Marland Kitchen atorvastatin (LIPITOR) 40 MG tablet TAKE 1 TABLET EVERY DAY  . Black Pepper-Turmeric (TURMERIC COMPLEX/BLACK PEPPER PO) Take 900 mg by mouth daily.   . Cholecalciferol (VITAMIN D PO) Take 15,000 Units by mouth daily.   . diphenhydrAMINE (BENADRYL) 25 MG tablet Take 25 mg by mouth at bedtime.  . docusate sodium (COLACE) 100 MG capsule Take 100 mg by mouth 2 (two) times daily. 1 tablet in the morning and 1 tablet at bedtime  . doxazosin (CARDURA) 4 MG tablet TAKE 1 TABLET EVERY DAY  . finasteride (PROSCAR) 5 MG tablet TAKE 1  TABLET EVERY DAY  . fluticasone (FLONASE) 50 MCG/ACT nasal spray Place 2 sprays into both nostrils at bedtime. PRN  . fluticasone furoate-vilanterol (BREO ELLIPTA) 100-25 MCG/INH AEPB Inhale 1 puff into the lungs daily. Rinse mouth with water after each use  . furosemide (LASIX) 40 MG tablet TAKE 1 TABLET EVERY DAY  . hydroxyurea (HYDREA) 500 MG capsule Take 1 capsule (500 mg total) by mouth daily. May take with food to minimize GI side effects.  Marland Kitchen levothyroxine (SYNTHROID, LEVOTHROID) 200 MCG tablet TAKE 1 TABLET EVERY DAY BEFORE BREAKFAST  . losartan (COZAAR) 50 MG tablet Take 1 tablet (50 mg total) by mouth daily.  . Magnesium 400 MG TABS Take 1 tablet by mouth daily.  .  metoprolol succinate (TOPROL-XL) 50 MG 24 hr tablet TAKE 1 TABLET EVERY DAY WITH FOOD  OR  IMMEDIATELY FOLLOWING A MEAL  . omeprazole (PRILOSEC) 20 MG capsule Take 1 capsule (20 mg total) by mouth 2 (two) times daily as needed.  Marland Kitchen OVER THE COUNTER MEDICATION daily. Vitamin A to Z 50 plus  . polyethylene glycol (MIRALAX / GLYCOLAX) packet Take 17 g by mouth daily.  . ranitidine (ZANTAC) 150 MG tablet TAKE 1 TABLET TWICE DAILY  . silver sulfADIAZINE (SILVADENE) 1 % cream Apply 1 application topically daily.  Marland Kitchen spironolactone (ALDACTONE) 25 MG tablet TAKE 1 TABLET EVERY DAY  . warfarin (COUMADIN) 5 MG tablet TAKE 1 AND 1/2 TABLETS EVERY DAY  OR AS DIRECTED   No current facility-administered medications on file prior to visit.    Allergies  Allergen Reactions  . Other Swelling    SODIUM SENSITIVE   Past Medical History:  Diagnosis Date  . A-fib ([place]) 01/2014  . AICD (automatic cardioverter/defibrillator) present    [employer]  . Arthritis   . ASHD (arteriosclerotic heart disease) 2015   s/p CABG  . Cataract    s/p left  . COPD (chronic obstructive pulmonary disease) ([place])    by CXR, pt unsure of this  . GERD (gastroesophageal reflux disease)   . Gout 2014  . Hyperlipidemia   . Hypertension   . Hypothyroidism   . LBBB (left bundle branch block)   . Pre-diabetes   . Prediabetes 01/2014  . Presence of permanent cardiac pacemaker   . Sleep apnea    no CPAP  . Thyroid disease    hypothyroid   Health Maintenance  Topic Date Due  . TETANUS/TDAP  09/25/1959  . INFLUENZA VACCINE  12/17/2017  . PNA vac Low Risk Adult  Completed   Immunization History  Administered Date(s) Administered  . Influenza, High Dose Seasonal PF 04/23/2015, 02/18/2017  . Influenza,inj,Quad PF,6+ Mos 01/31/2016  . Pneumococcal Conjugate-13 11/21/2014  . Pneumococcal Polysaccharide-23 06/01/2017   Last Colon -  11/11/2006 Dr Fuller Plan  Cologard 11/07/2016 - Negative  Past Surgical History:  Procedure  Laterality Date  . CARDIAC DEFIBRILLATOR PLACEMENT  07/2014  . CATARACT EXTRACTION W/ INTRAOCULAR LENS IMPLANT Left   . CORONARY ARTERY BYPASS GRAFT  11/2013  . lumb laminectomy  [phone]   x 3  . LUMBAR FUSION  2013  . NASAL SEPTOPLASTY W/ TURBINOPLASTY Bilateral 01/30/2016   Procedure: NASAL SEPTOPLASTY WITH BILATERAL TURBINATE REDUCTION;  Surgeon: Rozetta Nunnery, MD;  Location: [facility];  Service: ENT;  Laterality: Bilateral;  . UVULECTOMY N/A 01/30/2016   Procedure: UVULECTOMY;  Surgeon: Rozetta Nunnery, MD;  [facility];  Service: ENT;  Laterality: N/A;   Family History  Problem Relation Age of Onset  .  Alzheimer's disease Mother   . Mental illness Mother   . Depression Mother   . Heart disease Father 23       had 5 MI's & died 64 yo  . Alcohol abuse Son   . Stroke Maternal Grandmother    Social History   Socioeconomic History  . Marital status: Married    Spouse name: [name]   . Number of children: Not on file  . Years of education: 106  . Highest education level: Not on file  Occupational History  . Occupation: Retired  Tobacco Use  . Smoking status: Former Smoker    Packs/day: 2.00    Years: 25.00    Pack years: 50.00    Types: Cigarettes    Last attempt to quit: 05/20/1983    Years since quitting: 34.8  . Smokeless tobacco: Never Used  Substance and Sexual Activity  . Alcohol use: Not Currently    Alcohol/week: 0.0 standard drinks  . Drug use: No  . Sexual activity: Not on file  Social History Narrative   Lives with wife, [name]   Caffeine use: daily (Coffee)    ROS Constitutional: Denies fever, chills, weight loss/gain, headaches, insomnia,  night sweats or change in appetite. Does c/o fatigue. Eyes: Denies redness, blurred vision, diplopia, discharge, itchy or watery eyes.  ENT: Denies discharge, congestion, post nasal drip, epistaxis, sore throat, earache, hearing loss, dental pain, Tinnitus, Vertigo, Sinus pain or snoring.  Cardio: Denies  chest pain, palpitations, irregular heartbeat, syncope, dyspnea, diaphoresis, orthopnea, PND, claudication or edema Respiratory: denies cough, dyspnea, DOE, pleurisy, hoarseness, laryngitis or wheezing.  Gastrointestinal: Denies dysphagia, heartburn, reflux, water brash, pain, cramps, nausea, vomiting, bloating, diarrhea, constipation, hematemesis, melena, hematochezia, jaundice or hemorrhoids Genitourinary: Denies dysuria, frequency, urgency, nocturia, hesitancy, discharge, hematuria or flank pain Musculoskeletal: Denies arthralgia, myalgia, stiffness, Jt. Swelling, pain, limp or strain/sprain. Denies Falls. Skin: Denies puritis, rash, hives, warts, acne, eczema or change in skin lesion Neuro: No weakness, tremor, incoordination, spasms, paresthesia or pain Psychiatric: Denies confusion, memory loss or sensory loss. Denies Depression. Endocrine: Denies change in weight, skin, hair change, nocturia, and paresthesia, diabetic polys, visual blurring or hyper / hypo glycemic episodes.  Heme/Lymph: No excessive bleeding, bruising or enlarged lymph nodes.  Physical Exam  BP (!) 176/80   Pulse 60   Temp (!) 97.3 F (36.3 C)   Resp 18   Ht 5\' 9"  (1.753 m)   Wt 256 lb 3.2 oz (116.2 kg)   BMI 37.83 kg/m   General Appearance: Well nourished and well groomed and in no apparent distress.  Eyes: PERRLA, EOMs, conjunctiva no swelling or erythema, normal fundi and vessels. Sinuses: No frontal/maxillary tenderness ENT/Mouth: EACs patent / TMs  nl. Nares clear without erythema, swelling, mucoid exudates. Oral hygiene is good. No erythema, swelling, or exudate. Tongue normal, non-obstructing. Tonsils not swollen or erythematous. Hearing normal.  Neck: Supple, thyroid not palpable. No bruits, nodes or JVD. Respiratory: Respiratory effort normal.  BS equal and clear bilateral without rales, rhonci, wheezing or stridor. Cardio: Heart sounds are normal with regular rate and rhythm and no murmurs, rubs or  gallops. Peripheral pulses are normal and equal bilaterally without edema. No aortic or femoral bruits. Chest: symmetric with normal excursions and percussion.  Abdomen: Soft, with Nl bowel sounds. Nontender, no guarding, rebound, hernias, masses, or organomegaly.  Lymphatics: Non tender without lymphadenopathy.  Genitourinary: No hernias.Testes nl. DRE - prostate nl for age - smooth & firm w/o nodules. Musculoskeletal: Full ROM all peripheral extremities,  joint stability, 5/5 strength, and normal gait. Skin: Warm and dry without rashes, lesions, cyanosis, clubbing or  ecchymosis.  Neuro: Cranial nerves intact, reflexes equal bilaterally. Normal muscle tone, no cerebellar symptoms. Sensation intact.  Pysch: Alert and oriented X 3 with normal affect, insight and judgment appropriate.   Assessment and Plan  1. Essential hypertension  - EKG 12-Lead - [place], RETROPERITNL ABD,  LTD - Urinalysis, Routine w reflex microscopic - Microalbumin / creatinine urine ratio - CBC with Differential/Platelet - COMPLETE METABOLIC PANEL WITH GFR - Magnesium - TSH  2. Hyperlipemia, mixed  - EKG 12-Lead - [place], RETROPERITNL ABD,  LTD - Lipid panel - TSH  3. Abnormal glucose  - EKG 12-Lead - [place], RETROPERITNL ABD,  LTD - Hemoglobin A1c - Insulin, random  4. Vitamin D deficiency  - VITAMIN D 25 Hydroxyl  5. ASHD/CABG x 3V (11/2013)  - EKG 12-Lead - Lipid panel  6. Paroxysmal atrial fibrillation (HCC)  - EKG 12-Lead - TSH  7. Gastroesophageal reflux disease   8. Hypothyroidism, unspecified type  - TSH  9. Abdominal aortic aneurysm (AAA) without rupture (HCC)  - [place], RETROPERITNL ABD,  LTD  10. OSA (obstructive sleep apnea)   11. Gout  - Uric acid  12. BPH with obstruction/lower urinary tract symptoms  - PSA  13. Screening for colorectal cancer  - POC Hemoccult Bld/Stl e  14. Screening for ischemic heart disease  - EKG 12-Lead  15. Screening for AAA (aortic abdominal  aneurysm)  - [place], RETROPERITNL ABD,  LTD  16. FHx: heart disease  - EKG 12-Lead - [place], RETROPERITNL ABD,  LTD  17. Former smoker  - EKG 12-Lead - [place], RETROPERITNL ABD,  LTD  18. Prostate cancer screening  - PSA  19. Medication management  - Urinalysis, Routine w reflex microscopic - Microalbumin / creatinine urine ratio - PSA - CBC with Differential/Platelet - COMPLETE METABOLIC PANEL WITH GFR - Magnesium - Lipid panel - TSH - Hemoglobin A1c - Insulin, random - VITAMIN D 25 Hydroxyl         Patient was counseled in prudent diet, weight control to achieve/maintain BMI less than 25, BP monitoring, regular exercise and medications as discussed.  Discussed med effects and SE's. Routine screening labs and tests as requested with regular follow-up as recommended. Over 40 minutes of exam, counseling, chart review and high complex critical decision making was performed

## 2018-03-23 LAB — CBC WITH DIFFERENTIAL/PLATELET
BASOS ABS: 214 {cells}/uL — AB (ref 0–200)
Basophils Relative: 2.1 %
EOS ABS: 163 {cells}/uL (ref 15–500)
Eosinophils Relative: 1.6 %
HEMATOCRIT: 49.3 % (ref 38.5–50.0)
HEMOGLOBIN: 16.2 g/dL (ref 13.2–17.1)
LYMPHS ABS: 1489 {cells}/uL (ref 850–3900)
MCH: 27.5 pg (ref 27.0–33.0)
MCHC: 32.9 g/dL (ref 32.0–36.0)
MCV: 83.7 fL (ref 80.0–100.0)
MPV: 10.9 fL (ref 7.5–12.5)
Monocytes Relative: 4.3 %
Neutro Abs: 7895 cells/uL — ABNORMAL HIGH (ref 1500–7800)
Neutrophils Relative %: 77.4 %
Platelets: 447 10*3/uL — ABNORMAL HIGH (ref 140–400)
RBC: 5.89 10*6/uL — ABNORMAL HIGH (ref 4.20–5.80)
RDW: 19.3 % — ABNORMAL HIGH (ref 11.0–15.0)
Total Lymphocyte: 14.6 %
WBC: 10.2 10*3/uL (ref 3.8–10.8)
WBCMIX: 439 {cells}/uL (ref 200–950)

## 2018-03-23 LAB — HEMOGLOBIN A1C
EAG (MMOL/L): 6.8 (calc)
Hgb A1c MFr Bld: 5.9 % of total Hgb — ABNORMAL HIGH (ref <5.7)
Mean Plasma Glucose: 123 (calc)

## 2018-03-23 LAB — COMPLETE METABOLIC PANEL WITH GFR
AG Ratio: 2.2 (calc) (ref 1.0–2.5)
ALBUMIN MSPROF: 4.6 g/dL (ref 3.6–5.1)
ALKALINE PHOSPHATASE (APISO): 54 U/L (ref 40–115)
ALT: 24 U/L (ref 9–46)
AST: 34 U/L (ref 10–35)
BUN: 21 mg/dL (ref 7–25)
CHLORIDE: 100 mmol/L (ref 98–110)
CO2: 29 mmol/L (ref 20–32)
CREATININE: 1.09 mg/dL (ref 0.70–1.18)
Calcium: 9.7 mg/dL (ref 8.6–10.3)
GFR, Est African American: 75 mL/min/{1.73_m2} (ref >=60)
GFR, Est Non African American: 65 mL/min/{1.73_m2} (ref >=60)
Globulin: 2.1 g/dL (calc) (ref 1.9–3.7)
Glucose, Bld: 83 mg/dL (ref 65–99)
Potassium: 4.7 mmol/L (ref 3.5–5.3)
Sodium: 137 mmol/L (ref 135–146)
Total Bilirubin: 1 mg/dL (ref 0.2–1.2)
Total Protein: 6.7 g/dL (ref 6.1–8.1)

## 2018-03-23 LAB — URINALYSIS, ROUTINE W REFLEX MICROSCOPIC
Bilirubin Urine: NEGATIVE
Glucose, UA: NEGATIVE
HGB URINE DIPSTICK: NEGATIVE
Ketones, ur: NEGATIVE
LEUKOCYTES UA: NEGATIVE
NITRITE: NEGATIVE
PROTEIN: NEGATIVE
Specific Gravity, Urine: 1.006 (ref 1.001–1.03)
pH: 7 (ref 5.0–8.0)

## 2018-03-23 LAB — MICROALBUMIN / CREATININE URINE RATIO
CREATININE, URINE: 16 mg/dL — AB (ref 20–320)
MICROALB UR: 2.1 mg/dL
MICROALB/CREAT RATIO: 131 ug/mg{creat} — AB (ref <30)

## 2018-03-23 LAB — VITAMIN D 25 HYDROXY (VIT D DEFICIENCY, FRACTURES): VIT D 25 HYDROXY: 83 ng/mL (ref 30–100)

## 2018-03-23 LAB — LIPID PANEL
CHOLESTEROL: 141 mg/dL (ref <200)
HDL: 24 mg/dL — AB (ref >40)
LDL CHOLESTEROL (CALC): 90 mg/dL
Non-HDL Cholesterol (Calc): 117 mg/dL (calc) (ref <130)
TRIGLYCERIDES: 161 mg/dL — AB (ref <150)
Total CHOL/HDL Ratio: 5.9 (calc) — ABNORMAL HIGH (ref <5.0)

## 2018-03-23 LAB — MAGNESIUM: MAGNESIUM: 1.7 mg/dL (ref 1.5–2.5)

## 2018-03-23 LAB — TSH: TSH: 5 mIU/L — ABNORMAL HIGH (ref 0.40–4.50)

## 2018-03-23 LAB — INSULIN, RANDOM: INSULIN: 1.5 u[IU]/mL — AB (ref 2.0–19.6)

## 2018-03-23 LAB — PSA: PSA: 0.1 ng/mL (ref <=4.0)

## 2018-03-23 LAB — URIC ACID: Uric Acid, Serum: 5.1 mg/dL (ref 4.0–8.0)

## 2018-03-24 ENCOUNTER — Encounter: Payer: Self-pay | Admitting: *Deleted

## 2018-03-28 ENCOUNTER — Encounter: Payer: Self-pay | Admitting: Internal Medicine

## 2018-03-29 DIAGNOSIS — Z23 Encounter for immunization: Secondary | ICD-10-CM | POA: Diagnosis not present

## 2018-03-30 DIAGNOSIS — Z95810 Presence of automatic (implantable) cardiac defibrillator: Secondary | ICD-10-CM | POA: Diagnosis not present

## 2018-03-30 DIAGNOSIS — I255 Ischemic cardiomyopathy: Secondary | ICD-10-CM | POA: Diagnosis not present

## 2018-03-30 DIAGNOSIS — Z7901 Long term (current) use of anticoagulants: Secondary | ICD-10-CM | POA: Diagnosis not present

## 2018-03-30 DIAGNOSIS — I480 Paroxysmal atrial fibrillation: Secondary | ICD-10-CM | POA: Diagnosis not present

## 2018-04-07 ENCOUNTER — Inpatient Hospital Stay (HOSPITAL_BASED_OUTPATIENT_CLINIC_OR_DEPARTMENT_OTHER): Payer: Medicare Other | Admitting: Hematology

## 2018-04-07 ENCOUNTER — Encounter: Payer: Self-pay | Admitting: Hematology

## 2018-04-07 ENCOUNTER — Telehealth: Payer: Self-pay | Admitting: Hematology

## 2018-04-07 ENCOUNTER — Inpatient Hospital Stay: Payer: Medicare Other | Attending: Hematology

## 2018-04-07 VITALS — BP 130/64 | HR 64 | Temp 98.2°F | Resp 18 | Ht 69.0 in | Wt 256.3 lb

## 2018-04-07 DIAGNOSIS — D473 Essential (hemorrhagic) thrombocythemia: Secondary | ICD-10-CM | POA: Insufficient documentation

## 2018-04-07 DIAGNOSIS — Z1589 Genetic susceptibility to other disease: Secondary | ICD-10-CM

## 2018-04-07 LAB — CBC WITH DIFFERENTIAL/PLATELET
Abs Immature Granulocytes: 0.22 10*3/uL — ABNORMAL HIGH (ref 0.00–0.07)
BASOS PCT: 2 %
Basophils Absolute: 0.2 10*3/uL — ABNORMAL HIGH (ref 0.0–0.1)
Eosinophils Absolute: 0.1 10*3/uL (ref 0.0–0.5)
Eosinophils Relative: 1 %
HCT: 52.2 % — ABNORMAL HIGH (ref 39.0–52.0)
HEMOGLOBIN: 16 g/dL (ref 13.0–17.0)
Immature Granulocytes: 2 %
LYMPHS PCT: 12 %
Lymphs Abs: 1.6 10*3/uL (ref 0.7–4.0)
MCH: 27.1 pg (ref 26.0–34.0)
MCHC: 30.7 g/dL (ref 30.0–36.0)
MCV: 88.5 fL (ref 80.0–100.0)
MONO ABS: 0.5 10*3/uL (ref 0.1–1.0)
MONOS PCT: 4 %
NEUTROS PCT: 79 %
NRBC: 0.2 % (ref 0.0–0.2)
Neutro Abs: 10.3 10*3/uL — ABNORMAL HIGH (ref 1.7–7.7)
Platelets: 488 10*3/uL — ABNORMAL HIGH (ref 150–400)
RBC: 5.9 MIL/uL — ABNORMAL HIGH (ref 4.22–5.81)
RDW: 20.8 % — ABNORMAL HIGH (ref 11.5–15.5)
WBC: 13 10*3/uL — AB (ref 4.0–10.5)

## 2018-04-07 LAB — CMP (CANCER CENTER ONLY)
ALK PHOS: 66 U/L (ref 38–126)
ALT: 22 U/L (ref 0–44)
AST: 34 U/L (ref 15–41)
Albumin: 4.3 g/dL (ref 3.5–5.0)
Anion gap: 7 (ref 5–15)
BUN: 16 mg/dL (ref 8–23)
CALCIUM: 9.7 mg/dL (ref 8.9–10.3)
CO2: 31 mmol/L (ref 22–32)
CREATININE: 1.13 mg/dL (ref 0.61–1.24)
Chloride: 100 mmol/L (ref 98–111)
Glucose, Bld: 63 mg/dL — ABNORMAL LOW (ref 70–99)
Potassium: 4.8 mmol/L (ref 3.5–5.1)
Sodium: 138 mmol/L (ref 135–145)
Total Bilirubin: 1 mg/dL (ref 0.3–1.2)
Total Protein: 6.9 g/dL (ref 6.5–8.1)

## 2018-04-07 LAB — LACTATE DEHYDROGENASE: LDH: 543 U/L — ABNORMAL HIGH (ref 98–192)

## 2018-04-07 MED ORDER — HYDROXYUREA 500 MG PO CAPS: ORAL_CAPSULE | ORAL | 2 refills | Status: DC

## 2018-04-07 NOTE — Patient Instructions (Signed)
Thank you for choosing [facility] to provide your oncology and hematology care.  To afford each patient quality time with our providers, please arrive 30 minutes before your scheduled appointment time.  If you arrive late for your appointment, you may be asked to reschedule.  We strive to give you quality time with our providers, and arriving late affects you and other patients whose appointments are after yours.    If you are a no show for multiple scheduled visits, you may be dismissed from the clinic at the providers discretion.     Again, thank you for choosing [facility], our hope is that these requests will decrease the amount of time that you wait before being seen by our physicians.  ______________________________________________________________________   Should you have questions after your visit to the  Cancer Center, please contact our office at [phone] between the hours of 8:30 and 4:30 p.m.    Voicemails left after 4:30p.m will not be returned until the following business day.     For prescription refill requests, please have your pharmacy contact us directly.  Please also try to allow 48 hours for prescription requests.     Please contact the scheduling department for questions regarding scheduling.  For scheduling of procedures such as PET scans, CT scans, MRI, Ultrasound, etc please contact central scheduling at [phone].     Resources For Cancer Patients and Caregivers:    Oncolink.org:  A wonderful resource for patients and healthcare providers for information regarding your disease, ways to tract your treatment, what to expect, etc.      American Cancer Society:  800-227-2345  Can help patients locate various types of support and financial assistance   Cancer Care: 1-800-813-HOPE (4673) Provides financial assistance, online support groups, medication/co-pay assistance.     [place] DSS:  336-641-3447 Where to apply  for food stamps, Medicaid, and utility assistance   Medicare Rights Center: 800-333-4114 Helps people with Medicare understand their rights and benefits, navigate the Medicare system, and secure the quality healthcare they deserve   SCAT: 336-333-6589 [employer]'s shared-ride transportation service for eligible riders who have a disability that prevents them from riding the fixed route bus.     For additional information on assistance programs please contact our social worker:   Abigail Elmore:  [phone]  

## 2018-04-07 NOTE — Telephone Encounter (Signed)
Gave pt avs and calendar  °

## 2018-04-07 NOTE — Progress Notes (Signed)
HEMATOLOGY/ONCOLOGY CLINIC NOTE  Date of Service: 04/07/2018  Patient Care Team: [name], MD as PCP - General (Internal Medicine)  CHIEF COMPLAINTS/PURPOSE OF CONSULTATION:  F/u for MPN  HISTORY OF PRESENTING ILLNESS:   [name] [name] is a wonderful 77 y.o. male who has been referred to [place] by Dr. [name]  for evaluation and management of Thrombocytosis. He is accompanied today by his wife. The pt reports that he is doing well overall.   The pt reports that he has never had any blood clots.  He has atrial fibrillation and has taken Coumadin since 2015. He had heart surgery in 2015, and had his ICD placed in 2016. The pt also takes 81g aspirin daily.   The pt notes that he has not been aware of his high platelets previously, but only as of his most recent labs. He denies any recent major bleeds, surgeries, and other trauma. He denies having a splenectomy at any time as well.   The pt notes that his right kidney gets sore from time to time when he doesn't stay well hydrated. He also notes that when he urinates at these times, his urine is very dark. The pt adds that he had kidney stones one time. The pt is also taking Lasix and notes that he urinates frequently.   The pt adds that his gums have been very sore, even to touch from his tongue.  The pt notes that he does not want to use a CPAP after a bad experience with a previous sleep study that did diagnose him with sleep apnea. He notes that he falls asleep several times a day as well, falling asleep easily when he sits down, and denying falling asleep while driving. He also notes some increased difficulty remembering.   Most recent lab results (12/09/17) of CBC is as follows: all values are WNL except for WBC at 11.9k, RDW at 18.4, PLT at 649k, ANC at 9.7k.  On review of systems, pt reports good energy levels, falling asleep several times a day, staying active, and denies recent surgery, recent trauma, bleeding,  unexpected weight loss, pain along the spine, abdominal pains, and any other symptoms.   On Family Hx the pt reports prostate cancer.   Interval History:   [name] [name] returns today for management and evaluation of his newly diagnosed Essential thrombocytosis. The patient's last visit with [place] was on 03/03/18. He is accompanied today by his wife. The pt reports that he is doing well overall.   The pt reports that he has recently had softer stools a couple times a day. He has been on 400mg  magnesium replacement for a while. He denies any abdominal pains. He adds that he has not being drinking enough water, and is taking water pills.   The pt notes that his coumadin levels have been good, most recently at 2.8.  The pt denies any other new concerns at this time.   Lab results today (04/07/18) of CBC w/diff, CMP is as follows: all values are WNL except for WBC at 13.0k, RBC at 5.90, HCT at 52.2, RDW at 20.8, PLT at 488k, ANC at 10.3k, Basophils as at 200, Abs immature granulocytes at 0.22k, Glucose at 63. 04/07/18 LDH is pending  On review of systems, pt reports loose stools, stable energy levels, and denies abdominal pains, recent infections, fevers, leg swelling, and any other symptoms.   MEDICAL HISTORY:  Past Medical History:  Diagnosis Date   A-fib ([place]) 01/2014  AICD (automatic cardioverter/defibrillator) present    [place]    ASHD (arteriosclerotic heart disease) 2015   s/p CABG   Cataract    s/p left   COPD (chronic obstructive pulmonary disease) (HCC)    by CXR, pt unsure of this   GERD (gastroesophageal reflux disease)    Gout 2014   Hyperlipidemia    Hypertension    Hypothyroidism    LBBB (left bundle branch block)    Pre-diabetes    Prediabetes 01/2014   Presence of permanent cardiac pacemaker    Sleep apnea    no CPAP   Thyroid disease    hypothyroid    SURGICAL HISTORY: Past Surgical History:  Procedure Laterality  Date   CARDIAC DEFIBRILLATOR PLACEMENT  07/2014   CATARACT EXTRACTION W/ INTRAOCULAR LENS IMPLANT Left    CORONARY ARTERY BYPASS GRAFT  11/2013   lumb laminectomy  [id number]   x 3   LUMBAR FUSION  2013   NASAL SEPTOPLASTY W/ TURBINOPLASTY Bilateral 01/30/2016   Procedure: NASAL SEPTOPLASTY WITH BILATERAL TURBINATE REDUCTION;  Surgeon: Rozetta Nunnery, MD;  Location: [facility];  Service: ENT;  Laterality: Bilateral;   UVULECTOMY N/A 01/30/2016   Procedure: Martyn Ehrich;  Surgeon: Rozetta Nunnery, MD;  Location: [facility];  Service: ENT;  Laterality: N/A;    SOCIAL HISTORY: Social History   Socioeconomic History   Marital status: Married    Spouse name: Tightwad    Number of children: Not on file   Years of education: 13   Highest education level: Not on file  Occupational History   Occupation: Retired  Scientist, product/process development strain: Not on file   Food insecurity:    Worry: Not on file    Inability: Not on file   Transportation needs:    Medical: Not on file    Non-medical: Not on file  Tobacco Use   Smoking status: Former Smoker    Packs/day: 2.00    Years: 25.00    Pack years: 50.00    Types: Cigarettes    Last attempt to quit: 05/20/1983    Years since quitting: 34.9   Smokeless tobacco: Never Used  Substance and Sexual Activity   Alcohol use: Not Currently    Alcohol/week: 0.0 standard drinks   Drug use: No   Sexual activity: Not on file  Lifestyle   Physical activity:    Days per week: Not on file    Minutes per session: Not on file   Stress: Not on file  Relationships   Social connections:    Talks on phone: Not on file    Gets together: Not on file    Attends religious service: Not on file    Active member of club or organization: Not on file    Attends meetings of clubs or organizations: Not on file    Relationship status: Not on file   Intimate partner violence:    Fear of current or ex partner: Not on file     Emotionally abused: Not on file    Physically abused: Not on file    Forced sexual activity: Not on file  Other Topics Concern   Not on file  Social History Narrative   Lives with wife, [name]   Caffeine use: daily (Coffee)    FAMILY HISTORY: Family History  Problem Relation Age of Onset   Alzheimer's disease Mother    Mental illness Mother    Depression Mother    Heart  disease Father 41       had 31 MI's & died 44 yo   Alcohol abuse Son    Stroke Maternal Grandmother     ALLERGIES:  is allergic to other.  MEDICATIONS:  Current Outpatient Medications  Medication Sig Dispense Refill   acetaminophen (TYLENOL) 500 MG tablet Take 500 mg by mouth every 6 (six) hours as needed for mild pain.      albuterol (PROVENTIL HFA;VENTOLIN HFA) 108 (90 Base) MCG/ACT inhaler Inhale 2 puffs into the lungs every 6 (six) hours as needed for wheezing or shortness of breath. 1 Inhaler 3   albuterol (PROVENTIL) (2.5 MG/3ML) 0.083% nebulizer solution Take 3 mLs (2.5 mg total) by nebulization every 6 (six) hours as needed for wheezing or shortness of breath. 75 mL 12   allopurinol (ZYLOPRIM) 300 MG tablet TAKE 1 TABLET EVERY DAY 90 tablet 3   aspirin EC 81 MG tablet Take 81 mg by mouth daily.     atorvastatin (LIPITOR) 40 MG tablet TAKE 1 TABLET EVERY DAY 90 tablet 1   Black Pepper-Turmeric (TURMERIC COMPLEX/BLACK PEPPER PO) Take 900 mg by mouth daily.      Cholecalciferol (VITAMIN D PO) Take 15,000 Units by mouth daily.      diphenhydrAMINE (BENADRYL) 25 MG tablet Take 25 mg by mouth at bedtime.     docusate sodium (COLACE) 100 MG capsule Take 100 mg by mouth 2 (two) times daily. 1 tablet in the morning and 1 tablet at bedtime     doxazosin (CARDURA) 4 MG tablet TAKE 1 TABLET EVERY DAY 90 tablet 3   finasteride (PROSCAR) 5 MG tablet TAKE 1 TABLET EVERY DAY 90 tablet 3   fluticasone (FLONASE) 50 MCG/ACT nasal spray Place 2 sprays into both nostrils at bedtime. PRN 16 g 2    fluticasone furoate-vilanterol (BREO ELLIPTA) 100-25 MCG/INH AEPB Inhale 1 puff into the lungs daily. Rinse mouth with water after each use 1 each 0   furosemide (LASIX) 40 MG tablet TAKE 1 TABLET EVERY DAY 90 tablet 1   hydroxyurea (HYDREA) 500 MG capsule 1000 mg (2 caps) on Monday/wednesday and Friday and 500mg  (1cap) the rest of the days of the week .May take with food to minimize nausea. 120 capsule 2   levothyroxine (SYNTHROID, LEVOTHROID) 200 MCG tablet TAKE 1 TABLET EVERY DAY BEFORE BREAKFAST 90 tablet 3   losartan (COZAAR) 50 MG tablet Take 1 tablet (50 mg total) by mouth daily. 90 tablet 3   Magnesium 400 MG TABS Take 1 tablet by mouth daily.     metoprolol succinate (TOPROL-XL) 50 MG 24 hr tablet TAKE 1 TABLET EVERY DAY WITH FOOD  OR  IMMEDIATELY FOLLOWING A MEAL 90 tablet 3   omeprazole (PRILOSEC) 20 MG capsule Take 1 capsule (20 mg total) by mouth 2 (two) times daily as needed. 180 capsule 1   OVER THE COUNTER MEDICATION daily. Vitamin A to Z 50 plus     polyethylene glycol (MIRALAX / GLYCOLAX) packet Take 17 g by mouth daily.     ranitidine (ZANTAC) 150 MG tablet TAKE 1 TABLET TWICE DAILY 180 tablet 1   silver sulfADIAZINE (SILVADENE) 1 % cream Apply 1 application topically daily. 50 g 2   spironolactone (ALDACTONE) 25 MG tablet TAKE 1 TABLET EVERY DAY 90 tablet 1   warfarin (COUMADIN) 5 MG tablet TAKE 1 AND 1/2 TABLETS EVERY DAY  OR AS DIRECTED 145 tablet 1   No current facility-administered medications for this visit.     REVIEW  OF SYSTEMS:    A 10+ POINT REVIEW OF SYSTEMS WAS OBTAINED including neurology, dermatology, psychiatry, cardiac, respiratory, lymph, extremities, GI, GU, Musculoskeletal, constitutional, breasts, reproductive, HEENT.  All pertinent positives are noted in the HPI.  All others are negative.   PHYSICAL EXAMINATION: . Vitals:   04/07/18 1409  BP: 130/64  Pulse: 64  Resp: 18  Temp: 98.2 F (36.8 C)  SpO2: 96%   Filed Weights   04/07/18  1409  Weight: 256 lb 4.8 oz (116.3 kg)   .Body mass index is 37.85 kg/m.  GENERAL:alert, in no acute distress and comfortable SKIN: no acute rashes, no significant lesions EYES: conjunctiva are pink and non-injected, sclera anicteric OROPHARYNX: MMM, no exudates, no oropharyngeal erythema or ulceration NECK: supple, no JVD LYMPH:  no palpable lymphadenopathy in the cervical, axillary or inguinal regions LUNGS: clear to auscultation b/l with normal respiratory effort HEART: regular rate & rhythm ABDOMEN:  normoactive bowel sounds , non tender, not distended. No palpable hepatosplenomegaly.  Extremity: 2+ pedal edema PSYCH: alert & oriented x 3 with fluent speech NEURO: no focal motor/sensory deficits   LABORATORY DATA:  I have reviewed the data as listed  . CBC Latest Ref Rng & Units 04/07/2018 03/22/2018 02/09/2018  WBC 4.0 - 10.5 K/uL 13.0(H) 10.2 12.7(H)  Hemoglobin 13.0 - 17.0 g/dL 16.0 16.2 15.2  Hematocrit 39.0 - 52.0 % 52.2(H) 49.3 48.3  Platelets 150 - 400 K/uL 488(H) 447(H) 559(H)    . CMP Latest Ref Rng & Units 04/07/2018 03/22/2018 02/09/2018  Glucose 70 - 99 mg/dL 63(L) 83 105(H)  BUN 8 - 23 mg/dL 16 21 24(H)  Creatinine 0.61 - 1.24 mg/dL 1.13 1.09 1.09  Sodium 135 - 145 mmol/L 138 137 141  Potassium 3.5 - 5.1 mmol/L 4.8 4.7 4.4  Chloride 98 - 111 mmol/L 100 100 104  CO2 22 - 32 mmol/L 31 29 31   Calcium 8.9 - 10.3 mg/dL 9.7 9.7 9.8  Total Protein 6.5 - 8.1 g/dL 6.9 6.7 7.1  Total Bilirubin 0.3 - 1.2 mg/dL 1.0 1.0 0.9  Alkaline Phos 38 - 126 U/L 66 - 73  AST 15 - 41 U/L 34 34 32  ALT 0 - 44 U/L 22 24 35   02/09/18 JAK2 Mutation Study:    02/09/18 BCR ABL FISH:    RADIOGRAPHIC STUDIES: I have personally reviewed the radiological images as listed and agreed with the findings in the report. No results found.  ASSESSMENT & PLAN:   77 y.o. male with  1. Persistent Thrombocytosis -due to Newly Diagnosed Jak2 V617F mutation +ve Essential  thrombocytosis  02/09/18 JAK2 mutation study revealed the JAK 2 mutation [id number] at 75% allele frequency consistent with Essential thrombocytosis.   02/09/18 BCR ABL FISH revealed no evidence of BCR/ABL rearrangement   PLAN: -Patient's PLT at 649k upon presentation 12/09/17  -Platelets have been >600k since October 2018, over 400k since at least August 2016. Some associated leukocytosis. No history of splenectomy.  -No previous h/o VTE, but significant cardiac risk factors. -Patient's aspirin and Warfarin has been protective against vascular events and -Goal for PLT 200-400k -Recommended sun protection strategies as Hydroxyurea can increase sensitivity of skin, and that he should watch for mouth sores as well  -Recommend that the pt recheck PT/INR, 3 days after beginning Hydroxyurea, which he will coordinate with his next coumadin clinic visit at the end of the month   -Discussed pt labwork today, 04/07/18; HCT increased to 52.2%, PLT decreased to 488k. Blood counts and chemistries  are otherwise stable.  -Will watch HCT for possible polycythemia vera -Will increase Hydroxyurea from 500mg  every day, to 1000mg  3 times a week and 500mg  the other 4 days -Will see the pt back in 6 weeks   2. . Patient Active Problem List   Diagnosis Date Noted   Leukocytosis 12/29/2017   Senile purpura ([place]) 09/01/2017   Thrombocythemia ([place]) 06/02/2017   Emphysema of lung ([place]) 06/01/2017   Tortuous aorta ([place]) 06/01/2017   ASHD/CABG x 3V (11/2013) 02/06/2015   Encounter for monitoring Coumadin therapy 02/06/2015   Cardiac pacemaker/AICD (01/2014) 02/06/2015   Morbid obesity ([place]) 01/03/2015   HTN (hypertension) 01/02/2015   Mixed hyperlipidemia 01/02/2015   Prediabetes 01/02/2015   Vitamin D deficiency 01/02/2015   GERD  01/02/2015   BPH (benign prostatic hyperplasia) 01/02/2015   Medication management 01/02/2015   Atrial fibrillation ([place]) 01/02/2015   Hypothyroidism 01/02/2015    OSA (obstructive sleep apnea) 01/02/2015  -continue f/u with PCP to optimize atherosclerotic risk factors.  . Orders Placed This Encounter  Procedures   CBC with Differential/Platelet    Standing Status:   Future    Standing Expiration Date:   05/12/2019   CMP ([facility] only)    Standing Status:   Future    Standing Expiration Date:   04/08/2019   Lactate dehydrogenase    Standing Status:   Future    Standing Expiration Date:   04/08/2019    RTC with Dr Irene Limbo in 7 weeks with labs   All of the patients questions were answered with apparent satisfaction. The patient knows to call the clinic with any problems, questions or concerns.  The total time spent in the appt was 20 minutes and more than 50% was on counseling and direct patient cares.    Sullivan Lone MD MS AAHIVMS [facility] Hematology/Oncology Physician [facility]  (Office):       [phone] (Work cell):  [phone] (Fax):           [phone]  04/07/2018 3:09 PM  I, Baldwin [place], am acting as a scribe for Dr. Sullivan Lone.   .I have reviewed the above documentation for accuracy and completeness, and I agree with the above. Brunetta Genera MD

## 2018-04-09 ENCOUNTER — Other Ambulatory Visit: Payer: Self-pay | Admitting: Internal Medicine

## 2018-04-09 ENCOUNTER — Other Ambulatory Visit: Payer: Self-pay | Admitting: *Deleted

## 2018-04-09 MED ORDER — FUROSEMIDE 40 MG PO TABS: 40.0000 mg | ORAL_TABLET | Freq: Every day | ORAL | 3 refills | Status: DC

## 2018-04-09 MED ORDER — FINASTERIDE 5 MG PO TABS: 5.0000 mg | ORAL_TABLET | Freq: Every day | ORAL | 3 refills | Status: DC

## 2018-04-09 MED ORDER — ATORVASTATIN CALCIUM 40 MG PO TABS: 40.0000 mg | ORAL_TABLET | Freq: Every day | ORAL | 3 refills | Status: DC

## 2018-04-09 MED ORDER — ALLOPURINOL 300 MG PO TABS: 300.0000 mg | ORAL_TABLET | Freq: Every day | ORAL | 3 refills | Status: DC

## 2018-04-09 MED ORDER — SPIRONOLACTONE 25 MG PO TABS: 25.0000 mg | ORAL_TABLET | Freq: Every day | ORAL | 0 refills | Status: DC

## 2018-04-09 MED ORDER — LEVOTHYROXINE SODIUM 200 MCG PO TABS: ORAL_TABLET | ORAL | 3 refills | Status: DC

## 2018-04-09 MED ORDER — DOXAZOSIN MESYLATE 4 MG PO TABS: 4.0000 mg | ORAL_TABLET | Freq: Every day | ORAL | 3 refills | Status: DC

## 2018-04-13 ENCOUNTER — Other Ambulatory Visit: Payer: Self-pay | Admitting: *Deleted

## 2018-04-13 MED ORDER — SPIRONOLACTONE 25 MG PO TABS: 25.0000 mg | ORAL_TABLET | Freq: Every day | ORAL | 0 refills | Status: DC

## 2018-04-13 MED ORDER — FINASTERIDE 5 MG PO TABS: 5.0000 mg | ORAL_TABLET | Freq: Every day | ORAL | 3 refills | Status: DC

## 2018-04-13 MED ORDER — DOXAZOSIN MESYLATE 4 MG PO TABS: 4.0000 mg | ORAL_TABLET | Freq: Every day | ORAL | 3 refills | Status: DC

## 2018-04-13 MED ORDER — ATORVASTATIN CALCIUM 40 MG PO TABS: 40.0000 mg | ORAL_TABLET | Freq: Every day | ORAL | 3 refills | Status: DC

## 2018-04-17 DIAGNOSIS — Z95810 Presence of automatic (implantable) cardiac defibrillator: Secondary | ICD-10-CM | POA: Diagnosis not present

## 2018-04-17 DIAGNOSIS — I255 Ischemic cardiomyopathy: Secondary | ICD-10-CM | POA: Diagnosis not present

## 2018-04-27 DIAGNOSIS — Z95810 Presence of automatic (implantable) cardiac defibrillator: Secondary | ICD-10-CM | POA: Diagnosis not present

## 2018-04-27 DIAGNOSIS — I255 Ischemic cardiomyopathy: Secondary | ICD-10-CM | POA: Diagnosis not present

## 2018-04-27 DIAGNOSIS — Z7901 Long term (current) use of anticoagulants: Secondary | ICD-10-CM | POA: Diagnosis not present

## 2018-04-27 DIAGNOSIS — I480 Paroxysmal atrial fibrillation: Secondary | ICD-10-CM | POA: Diagnosis not present

## 2018-05-21 DIAGNOSIS — I480 Paroxysmal atrial fibrillation: Secondary | ICD-10-CM | POA: Diagnosis not present

## 2018-05-21 DIAGNOSIS — R0602 Shortness of breath: Secondary | ICD-10-CM | POA: Diagnosis not present

## 2018-05-21 DIAGNOSIS — I255 Ischemic cardiomyopathy: Secondary | ICD-10-CM | POA: Diagnosis not present

## 2018-05-21 DIAGNOSIS — Z95810 Presence of automatic (implantable) cardiac defibrillator: Secondary | ICD-10-CM | POA: Diagnosis not present

## 2018-05-21 DIAGNOSIS — I2510 Atherosclerotic heart disease of native coronary artery without angina pectoris: Secondary | ICD-10-CM | POA: Diagnosis not present

## 2018-05-25 DIAGNOSIS — I480 Paroxysmal atrial fibrillation: Secondary | ICD-10-CM | POA: Diagnosis not present

## 2018-05-25 DIAGNOSIS — I255 Ischemic cardiomyopathy: Secondary | ICD-10-CM | POA: Diagnosis not present

## 2018-05-25 DIAGNOSIS — Z7901 Long term (current) use of anticoagulants: Secondary | ICD-10-CM | POA: Diagnosis not present

## 2018-05-25 DIAGNOSIS — Z95810 Presence of automatic (implantable) cardiac defibrillator: Secondary | ICD-10-CM | POA: Diagnosis not present

## 2018-05-25 NOTE — Progress Notes (Signed)
HEMATOLOGY/ONCOLOGY CLINIC NOTE  Date of Service: 05/26/2018  Patient Care Team: [name], MD as PCP - General (Internal Medicine)  CHIEF COMPLAINTS/PURPOSE OF CONSULTATION:  F/u for MPN  HISTORY OF PRESENTING ILLNESS:   [name] [name] is a wonderful 78 y.o. male who has been referred to [place] by Dr. [name]  for evaluation and management of Thrombocytosis. He is accompanied today by his wife. The pt reports that he is doing well overall.   The pt reports that he has never had any blood clots.  He has atrial fibrillation and has taken Coumadin since 2015. He had heart surgery in 2015, and had his ICD placed in 2016. The pt also takes 81g aspirin daily.   The pt notes that he has not been aware of his high platelets previously, but only as of his most recent labs. He denies any recent major bleeds, surgeries, and other trauma. He denies having a splenectomy at any time as well.   The pt notes that his right [name] gets sore from time to time when he doesn't stay well hydrated. He also notes that when he urinates at these times, his urine is very dark. The pt adds that he had [name] stones one time. The pt is also taking Lasix and notes that he urinates frequently.   The pt adds that his gums have been very sore, even to touch from his tongue.  The pt notes that he does not want to use a CPAP after a bad experience with a previous sleep study that did diagnose him with sleep apnea. He notes that he falls asleep several times a day as well, falling asleep easily when he sits down, and denying falling asleep while driving. He also notes some increased difficulty remembering.   Most recent lab results (12/09/17) of CBC is as follows: all values are WNL except for WBC at 11.9k, RDW at 18.4, PLT at 649k, ANC at 9.7k.  On review of systems, pt reports good energy levels, falling asleep several times a day, staying active, and denies recent surgery, recent trauma, bleeding,  unexpected weight loss, pain along the spine, abdominal pains, and any other symptoms.   On Family Hx the pt reports prostate cancer.   Interval History:   [name] [name] returns today for management and evaluation of his newly diagnosed Essential thrombocytosis. The patient's last visit with [place] was on 04/07/18. He is accompanied today by his wife. The pt reports that he is doing well overall.   The pt reports that he is having about 4 smaller bowel movements a day, somewhat looser stools, which he believes began after he started hydroxyurea a couple months ago. He denies noticing more bowel movements on the days that he takes 1000mg  Hydroxyurea as opposed to 500mg  Hydroxyurea. He notes that he doesn't specifically associate Hydroxyurea with his increased bowel movements, but wanted to mention this nevertheless. The pt also denies being bothered by this and believes it may be good for him. He takes stool softeners every day as well.  He was increased to 100mg  Metoprolol in the interim. The pt notes that he continues on his diuretics and may not be staying as hydrated as he should.   Lab results today (05/26/18) of CBC w/diff and CMP is as follows: all values are WNL except for WBC at 14.4k, RDW at 21.4, ANC at 11.8k.  On review of systems, pt reports loose stools, frequent bowel movements, stable ankle swelling, stable energy levels,  and denies abdominal pains, and any other symptoms.   MEDICAL HISTORY:  Past Medical History:  Diagnosis Date  . A-fib ([place]) 01/2014  . AICD (automatic cardioverter/defibrillator) present    [employer]  . Arthritis   . ASHD (arteriosclerotic heart disease) 2015   s/p CABG  . Cataract    s/p left  . COPD (chronic obstructive pulmonary disease) ([place])    by CXR, pt unsure of this  . GERD (gastroesophageal reflux disease)   . Gout 2014  . Hyperlipidemia   . Hypertension   . Hypothyroidism   . LBBB (left bundle branch block)   . Pre-diabetes   .  Prediabetes 01/2014  . Presence of permanent cardiac pacemaker   . Sleep apnea    no CPAP  . Thyroid disease    hypothyroid    SURGICAL HISTORY: Past Surgical History:  Procedure Laterality Date  . CARDIAC DEFIBRILLATOR PLACEMENT  07/2014  . CATARACT EXTRACTION W/ INTRAOCULAR LENS IMPLANT Left   . CORONARY ARTERY BYPASS GRAFT  11/2013  . lumb laminectomy  [phone]   x 3  . LUMBAR FUSION  2013  . NASAL SEPTOPLASTY W/ TURBINOPLASTY Bilateral 01/30/2016   Procedure: NASAL SEPTOPLASTY WITH BILATERAL TURBINATE REDUCTION;  Surgeon: Rozetta Nunnery, MD;  Location: [facility];  Service: ENT;  Laterality: Bilateral;  . UVULECTOMY N/A 01/30/2016   Procedure: UVULECTOMY;  Surgeon: Rozetta Nunnery, MD;  Location: [facility] OR;  Service: ENT;  Laterality: N/A;    SOCIAL HISTORY: Social History   Socioeconomic History  . Marital status: Married    Spouse name: [name] [name]   . Number of children: Not on file  . Years of education: 35  . Highest education level: Not on file  Occupational History  . Occupation: Retired  Scientific laboratory technician  . Financial resource strain: Not on file  . Food insecurity:    Worry: Not on file    Inability: Not on file  . Transportation needs:    Medical: Not on file    Non-medical: Not on file  Tobacco Use  . Smoking status: Former Smoker    Packs/day: 2.00    Years: 25.00    Pack years: 50.00    Types: Cigarettes    Last attempt to quit: 05/20/1983    Years since quitting: 35.0  . Smokeless tobacco: Never Used  Substance and Sexual Activity  . Alcohol use: Not Currently    Alcohol/week: 0.0 standard drinks  . Drug use: No  . Sexual activity: Not on file  Lifestyle  . Physical activity:    Days per week: Not on file    Minutes per session: Not on file  . Stress: Not on file  Relationships  . Social connections:    Talks on phone: Not on file    Gets together: Not on file    Attends religious service: Not on file    Active member of club or organization:  Not on file    Attends meetings of clubs or organizations: Not on file    Relationship status: Not on file  . Intimate partner violence:    Fear of current or ex partner: Not on file    Emotionally abused: Not on file    Physically abused: Not on file    Forced sexual activity: Not on file  Other Topics Concern  . Not on file  Social History Narrative   Lives with wife, [name] [name]   Caffeine use: daily (Coffee)    FAMILY HISTORY: Family History  Problem Relation Age of Onset  . Alzheimer's disease Mother   . Mental illness Mother   . Depression Mother   . Heart disease Father 82       had 5 MI's & died 87 yo  . Alcohol abuse Son   . Stroke Maternal Grandmother     ALLERGIES:  is allergic to other.  MEDICATIONS:  Current Outpatient Medications  Medication Sig Dispense Refill  . acetaminophen (TYLENOL) 500 MG tablet Take 500 mg by mouth every 6 (six) hours as needed for mild pain.     Marland Kitchen albuterol (PROVENTIL HFA;VENTOLIN HFA) 108 (90 Base) MCG/ACT inhaler Inhale 2 puffs into the lungs every 6 (six) hours as needed for wheezing or shortness of breath. 1 Inhaler 3  . albuterol (PROVENTIL) (2.5 MG/3ML) 0.083% nebulizer solution Take 3 mLs (2.5 mg total) by nebulization every 6 (six) hours as needed for wheezing or shortness of breath. 75 mL 12  . allopurinol (ZYLOPRIM) 300 MG tablet Take 1 tablet (300 mg total) by mouth daily. 90 tablet 3  . aspirin EC 81 MG tablet Take 81 mg by mouth daily.    Marland Kitchen atorvastatin (LIPITOR) 40 MG tablet Take 1 tablet (40 mg total) by mouth daily. 90 tablet 3  . Black Pepper-Turmeric (TURMERIC COMPLEX/BLACK PEPPER PO) Take 900 mg by mouth daily.     . Cholecalciferol (VITAMIN D PO) Take 15,000 Units by mouth daily.     . diphenhydrAMINE (BENADRYL) 25 MG tablet Take 25 mg by mouth at bedtime.    . docusate sodium (COLACE) 100 MG capsule Take 100 mg by mouth 2 (two) times daily. 1 tablet in the morning and 1 tablet at bedtime    . doxazosin (CARDURA) 4 MG  tablet Take 1 tablet (4 mg total) by mouth daily. 90 tablet 3  . finasteride (PROSCAR) 5 MG tablet Take 1 tablet (5 mg total) by mouth daily. 90 tablet 3  . fluticasone (FLONASE) 50 MCG/ACT nasal spray Place 2 sprays into both nostrils at bedtime. PRN 16 g 2  . fluticasone furoate-vilanterol (BREO ELLIPTA) 100-25 MCG/INH AEPB Inhale 1 puff into the lungs daily. Rinse mouth with water after each use 1 each 0  . furosemide (LASIX) 40 MG tablet Take 1 tablet (40 mg total) by mouth daily. 90 tablet 3  . hydroxyurea (HYDREA) 500 MG capsule 1000 mg (2 caps) on Monday/wednesday and Friday and 500mg  (1cap) the rest of the days of the week .May take with food to minimize nausea. 120 capsule 2  . levothyroxine (SYNTHROID, LEVOTHROID) 200 MCG tablet TAKE 1 TABLET EVERY DAY BEFORE BREAKFAST 90 tablet 3  . losartan (COZAAR) 50 MG tablet Take 1 tablet (50 mg total) by mouth daily. 90 tablet 3  . Magnesium 400 MG TABS Take 1 tablet by mouth daily.    . metoprolol succinate (TOPROL-XL) 50 MG 24 hr tablet TAKE 1 TABLET EVERY DAY WITH FOOD  OR  IMMEDIATELY FOLLOWING A MEAL 90 tablet 3  . omeprazole (PRILOSEC) 20 MG capsule Take 1 capsule (20 mg total) by mouth 2 (two) times daily as needed. 180 capsule 1  . OVER THE COUNTER MEDICATION daily. Vitamin A to Z 50 plus    . polyethylene glycol (MIRALAX / GLYCOLAX) packet Take 17 g by mouth daily.    . ranitidine (ZANTAC) 150 MG tablet TAKE 1 TABLET TWICE DAILY (Patient not taking: Reported on 04/07/2018) 180 tablet 1  . silver sulfADIAZINE (SILVADENE) 1 % cream Apply 1 application topically daily. 50 g  2  . spironolactone (ALDACTONE) 25 MG tablet Take 1 tablet (25 mg total) by mouth daily. 90 tablet 0  . warfarin (COUMADIN) 5 MG tablet TAKE 1 AND 1/2 TABLETS EVERY DAY  OR AS DIRECTED 145 tablet 1   No current facility-administered medications for this visit.     REVIEW OF SYSTEMS:    A 10+ POINT REVIEW OF SYSTEMS WAS OBTAINED including neurology, dermatology,  psychiatry, cardiac, respiratory, lymph, extremities, GI, GU, Musculoskeletal, constitutional, breasts, reproductive, HEENT.  All pertinent positives are noted in the HPI.  All others are negative.   PHYSICAL EXAMINATION: . Vitals:   05/26/18 1357  BP: (!) 137/55  Pulse: 63  Resp: 17  Temp: 97.9 F (36.6 C)  SpO2: 95%   Filed Weights   05/26/18 1357  Weight: 263 lb 1.6 oz (119.3 kg)   .Body mass index is 38.85 kg/m.  GENERAL:alert, in no acute distress and comfortable SKIN: no acute rashes, no significant lesions EYES: conjunctiva are pink and non-injected, sclera anicteric OROPHARYNX: MMM, no exudates, no oropharyngeal erythema or ulceration NECK: supple, no JVD LYMPH:  no palpable lymphadenopathy in the cervical, axillary or inguinal regions LUNGS: clear to auscultation b/l with normal respiratory effort HEART: regular rate & rhythm ABDOMEN:  normoactive bowel sounds , non tender, not distended. No palpable hepatosplenomegaly.  Extremity: 2+ pedal edema PSYCH: alert & oriented x 3 with fluent speech NEURO: no focal motor/sensory deficits   LABORATORY DATA:  I have reviewed the data as listed  . CBC Latest Ref Rng & Units 05/26/2018 04/07/2018 03/22/2018  WBC 4.0 - 10.5 K/uL 14.4(H) 13.0(H) 10.2  Hemoglobin 13.0 - 17.0 g/dL 16.1 16.0 16.2  Hematocrit 39.0 - 52.0 % 51.9 52.2(H) 49.3  Platelets 150 - 400 K/uL 381 488(H) 447(H)    . CMP Latest Ref Rng & Units 05/26/2018 04/07/2018 03/22/2018  Glucose 70 - 99 mg/dL 98 63(L) 83  BUN 8 - 23 mg/dL 17 16 21   Creatinine 0.61 - 1.24 mg/dL 1.12 1.13 1.09  Sodium 135 - 145 mmol/L 137 138 137  Potassium 3.5 - 5.1 mmol/L 4.7 4.8 4.7  Chloride 98 - 111 mmol/L 100 100 100  CO2 22 - 32 mmol/L 30 31 29   Calcium 8.9 - 10.3 mg/dL 9.6 9.7 9.7  Total Protein 6.5 - 8.1 g/dL 6.8 6.9 6.7  Total Bilirubin 0.3 - 1.2 mg/dL 1.0 1.0 1.0  Alkaline Phos 38 - 126 U/L 74 66 -  AST 15 - 41 U/L 25 34 34  ALT 0 - 44 U/L 22 22 24    02/09/18 JAK2  Mutation Study:    02/09/18 BCR ABL FISH:    RADIOGRAPHIC STUDIES: I have personally reviewed the radiological images as listed and agreed with the findings in the report. No results found.  ASSESSMENT & PLAN:   78 y.o. male with  1. 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 Some associated leukocytosis. No history of splenectomy. No previous h/o VTE, but significant cardiac risk factors. 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   02/09/18 BCR ABL FISH revealed no evidence of BCR/ABL rearrangement   PLAN: -Discussed pt labwork today, 05/26/18; PLT have normalized to 381k, ANC at 11.8k, HGB at 16.1, chemistries  are normal. HCT normalized to 51.9, though still reveals an element of hemo-concentration -Advised pt stay well hydrated -PLT now at goal, which is to be between 200-400k -The pt has no prohibitive toxicities from continuing 1000mg  Hydroxyurea 3 days a week, and 500mg  Hydroxyurea the other 4 days a week, at this time.   -Recommend pt decrease laxatives given 4 bowel movements a day -Recommended sun protection strategies as Hydroxyurea can increase sensitivity of skin, and that he should watch for mouth sores as well  -Will continue to watch HCT for possible polycythemia vera -Will see the pt back in 2 months   2. . Patient Active Problem List   Diagnosis Date Noted  . Leukocytosis 12/29/2017  . Senile purpura ([place]) 09/01/2017  . Thrombocythemia ([place]) 06/02/2017  . Emphysema of lung ([place]) 06/01/2017  . Tortuous aorta ([facility]) 06/01/2017  . ASHD/CABG x 3V (11/2013) 02/06/2015  . Encounter for monitoring Coumadin therapy  02/06/2015  . Cardiac pacemaker/AICD (01/2014) 02/06/2015  . Morbid obesity ([place]) 01/03/2015  . HTN (hypertension) 01/02/2015  . Mixed hyperlipidemia 01/02/2015  . Prediabetes 01/02/2015  . Vitamin D deficiency 01/02/2015  . GERD  01/02/2015  . BPH (benign prostatic hyperplasia) 01/02/2015  . Medication management 01/02/2015  . Atrial fibrillation ([place]) 01/02/2015  . Hypothyroidism 01/02/2015  . OSA (obstructive sleep apnea) 01/02/2015  -continue f/u with PCP to optimize atherosclerotic risk factors.  . Orders Placed This Encounter  Procedures  . CBC with Differential/Platelet    Standing Status:   Future    Standing Expiration Date:   06/30/2019  . CMP ([place] only)    Standing Status:   Future    Standing Expiration Date:   05/27/2019  . Lactate dehydrogenase    Standing Status:   Future    Standing Expiration Date:   05/27/2019    RTC with Dr Irene Limbo with labs in 2 months   All of the patients questions were answered with apparent satisfaction. The patient knows to call the clinic with any problems, questions or concerns.  The total time spent in the appt was 25 minutes and more than 50% was on counseling and direct patient cares.    Sullivan Lone MD MS AAHIVMS [facility] Hematology/Oncology Physician [facility]  (Office):       [phone] (Work cell):  [phone] (Fax):           [phone]  05/26/2018 2:49 PM  I, Baldwin [place], am acting as a scribe for Dr. Sullivan Lone.   .I have reviewed the above documentation for accuracy and completeness, and I agree with the above. Brunetta Genera MD

## 2018-05-26 ENCOUNTER — Inpatient Hospital Stay (HOSPITAL_BASED_OUTPATIENT_CLINIC_OR_DEPARTMENT_OTHER): Payer: Medicare Other | Admitting: Hematology

## 2018-05-26 ENCOUNTER — Inpatient Hospital Stay: Payer: Medicare Other | Attending: Hematology

## 2018-05-26 ENCOUNTER — Telehealth: Payer: Self-pay | Admitting: Hematology

## 2018-05-26 VITALS — BP 137/55 | HR 63 | Temp 97.9°F | Resp 17 | Ht 69.0 in | Wt 263.1 lb

## 2018-05-26 DIAGNOSIS — D473 Essential (hemorrhagic) thrombocythemia: Secondary | ICD-10-CM | POA: Insufficient documentation

## 2018-05-26 DIAGNOSIS — Z79899 Other long term (current) drug therapy: Secondary | ICD-10-CM | POA: Diagnosis not present

## 2018-05-26 DIAGNOSIS — Z1589 Genetic susceptibility to other disease: Secondary | ICD-10-CM

## 2018-05-26 LAB — CMP (CANCER CENTER ONLY)
ALBUMIN: 4.1 g/dL (ref 3.5–5.0)
ALK PHOS: 74 U/L (ref 38–126)
ALT: 22 U/L (ref 0–44)
AST: 25 U/L (ref 15–41)
Anion gap: 7 (ref 5–15)
BUN: 17 mg/dL (ref 8–23)
CALCIUM: 9.6 mg/dL (ref 8.9–10.3)
CO2: 30 mmol/L (ref 22–32)
CREATININE: 1.12 mg/dL (ref 0.61–1.24)
Chloride: 100 mmol/L (ref 98–111)
GFR, Est AFR Am: >60 mL/min (ref >60)
GFR, Estimated: >60 mL/min (ref >60)
GLUCOSE: 98 mg/dL (ref 70–99)
Potassium: 4.7 mmol/L (ref 3.5–5.1)
SODIUM: 137 mmol/L (ref 135–145)
Total Bilirubin: 1 mg/dL (ref 0.3–1.2)
Total Protein: 6.8 g/dL (ref 6.5–8.1)

## 2018-05-26 LAB — CBC WITH DIFFERENTIAL/PLATELET
Abs Immature Granulocytes: 0.24 10*3/uL — ABNORMAL HIGH (ref 0.00–0.07)
BASOS ABS: 0.2 10*3/uL — AB (ref 0.0–0.1)
Basophils Relative: 1 %
Eosinophils Absolute: 0.1 10*3/uL (ref 0.0–0.5)
Eosinophils Relative: 1 %
HCT: 51.9 % (ref 39.0–52.0)
HEMOGLOBIN: 16.1 g/dL (ref 13.0–17.0)
IMMATURE GRANULOCYTES: 2 %
LYMPHS PCT: 10 %
Lymphs Abs: 1.5 10*3/uL (ref 0.7–4.0)
MCH: 27.9 pg (ref 26.0–34.0)
MCHC: 31 g/dL (ref 30.0–36.0)
MCV: 89.9 fL (ref 80.0–100.0)
MONO ABS: 0.5 10*3/uL (ref 0.1–1.0)
Monocytes Relative: 3 %
Neutro Abs: 11.8 10*3/uL — ABNORMAL HIGH (ref 1.7–7.7)
Neutrophils Relative %: 83 %
PLATELETS: 381 10*3/uL (ref 150–400)
RBC: 5.77 MIL/uL (ref 4.22–5.81)
RDW: 21.4 % — ABNORMAL HIGH (ref 11.5–15.5)
WBC: 14.4 10*3/uL — ABNORMAL HIGH (ref 4.0–10.5)
nRBC: 0 % (ref 0.0–0.2)

## 2018-05-26 LAB — LACTATE DEHYDROGENASE: LDH: 412 U/L — ABNORMAL HIGH (ref 98–192)

## 2018-05-26 MED ORDER — HYDROXYUREA 500 MG PO CAPS: ORAL_CAPSULE | ORAL | 2 refills | Status: DC

## 2018-05-26 NOTE — Telephone Encounter (Signed)
Printed calendar and avs. °

## 2018-06-28 NOTE — Progress Notes (Signed)
MEDICARE ANNUAL WELLNESS VISIT AND FOLLOW UP Assessment:   Diagnoses and all orders for this visit:  Medicare annual wellness visit, subsequent  Essential hypertension At goal; continue medications Monitor blood pressure at home; call if consistently over 130/80 Continue DASH diet.   Reminder to go to the ER if any CP, SOB, nausea, dizziness, severe HA, changes vision/speech, left arm numbness and tingling and jaw pain.  Paroxysmal atrial fibrillation ([place]) Continue follow up with a fib clinic for coumadin management Discussed if patient falls to immediately contact office or go to ER. Discussed foods that can increase or decrease Coumadin levels. Patient understands to call the office before starting a new medication.  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 Followed by cardiology    Tortuous aorta Control blood pressure, cholesterol, glucose, increase exercise.   OSA (obstructive sleep apnea) Didn't tolerate CPAP, symptoms improved with sinus surgery, weight loss, sleeping in recliner weight loss still advised.   Gastroesophageal reflux disease, esophagitis presence not specified Well managed on current medications Discussed diet, avoiding triggers and other lifestyle changes  Hypothyroidism, unspecified type continue medications the same pending labs reminded to take on an empty stomach 30-54mins before food, 4+ hours apart from reflux meds -     TSH  Benign prostatic hyperplasia with lower urinary tract symptoms, symptom details unspecified Continue medications; symptoms well controlled Check annual PSA; urology referral as indicated  Mixed hyperlipidemia At goal for LDL; continue statin- discussed diet for borderline trigs Continue low cholesterol diet and exercise.  -     Lipid panel  Prediabetes Discussed disease and risks of elevated glucose Discussed diet/exercise, weight management  -     Hemoglobin  A1c  Vitamin D deficiency At goal; continue supplementation Defer vitamin D level  Medication management -     CBC with Differential/Platelet -     CMP/GFR -     Magnesium  Morbid obesity ([place]) Long discussion about weight loss, diet, and exercise Recommended diet heavy in fruits and veggies and low in animal meats, cheeses, and dairy products, appropriate calorie intake Discussed appropriate weight for height, goal of <250lb set, he will restart with exercise, encouraged him to cut back on snacking, to watch portions Follow up in 3 months  Cardiac pacemaker/AICD (01/2014) Continue follow up with cardiology  Emphysema/50 pack year smoking history  Not candidate for screening CT CXR annually, monitor closely  Doing well with rare PRN albuterol   Thrombocytosis Followed by Dr. Irene Limbo, on hydroxyurea 500 mg  Monitor CBC  Over 30 minutes of exam, counseling, chart review, and critical decision making was performed  Future Appointments  Date Time Provider [place]  07/26/2018  1:15 PM [facility] None  07/26/2018  1:40 PM Brunetta Genera, MD [facility] None  09/29/2018  2:30 PM [name], MD [facility] None  04/12/2019  2:00 PM [name], MD [facility] None     Plan:   During the course of the visit the patient was educated and counseled about appropriate screening and preventive services including:    Pneumococcal vaccine   Influenza vaccine  Prevnar 13  Td vaccine  Screening electrocardiogram  Colorectal cancer screening  Diabetes screening  Glaucoma screening  Nutrition counseling    Subjective:  [name] [name] is a 78 y.o. male who presents for Medicare Annual Wellness Visit and 3 month follow up for HTN, hyperlipidemia, prediabetes, and vitamin D Def.  In 2015, he underwent a CABG x 3 V  w/po Afib- and failed 2 attempts at VCV. Then in 07/2014, he had a BiVent, AICD/Defib/PM planted. He is followed at his  Cardiologist (Dr. Georg Ruddle) coumadin clinic.  Patient denies any cardiac symptoms as chest pain, palpitations, shortness of breath, dizziness or ankle swelling. He has dx of sleep apnea but did not tolerate CPAP.   he has a diagnosis of GERD which is currently managed by prilosec 20 mg once daily prior to dinner he reports symptoms is currently well controlled, and denies breakthrough reflux, burning in chest, hoarseness or cough.    BMI is Body mass index is 38.25 kg/m., he has been working on diet and exercise, trying to start working out at home more, has treadmill and weight machines.  Wt Readings from Last 3 Encounters:  06/29/18 259 lb (117.5 kg)  05/26/18 263 lb 1.6 oz (119.3 kg)  04/07/18 256 lb 4.8 oz (116.3 kg)   His blood pressure has been controlled at home, today their BP is BP: 116/64 He does workout. He denies chest pain, shortness of breath, dizziness.   He is on cholesterol medication (atorvastatin 40 mg daily) and denies myalgias. His LDL cholesterol is at goal. The cholesterol last visit was:   Lab Results  Component Value Date   CHOL 141 03/22/2018   HDL 24 (L) 03/22/2018   LDLCALC 90 03/22/2018   TRIG 161 (H) 03/22/2018   CHOLHDL 5.9 (H) 03/22/2018   He has been working on diet and exercise for prediabetes, and denies increased appetite, nausea, paresthesia of the feet, polydipsia, polyuria, visual disturbances, vomiting and weight loss. Last A1C in the office was:  Lab Results  Component Value Date   HGBA1C 5.9 (H) 03/22/2018   He is on thyroid medication. His medication was not changed last visit. He is taking levothyroxine 200 mcg, has been taking 30 min prior to other food or meds since last vist   Lab Results  Component Value Date   TSH 5.00 (H) 03/22/2018   Last GFR Lab Results  Component Value Date   GFRNONAA >60 05/26/2018   Patient is on Vitamin D supplement and at goal at recent check:    Lab Results  Component Value Date   VD25OH 83 03/22/2018      Patient is on allopurinol for gout and does not report a recent flare.  Lab Results  Component Value Date   LABURIC 5.1 03/22/2018    Medication Review: Current Outpatient Medications on File Prior to Visit  Medication Sig Dispense Refill  . acetaminophen (TYLENOL) 500 MG tablet Take 500 mg by mouth every 6 (six) hours as needed for mild pain.     Marland Kitchen albuterol (PROVENTIL HFA;VENTOLIN HFA) 108 (90 Base) MCG/ACT inhaler Inhale 2 puffs into the lungs every 6 (six) hours as needed for wheezing or shortness of breath. 1 Inhaler 3  . albuterol (PROVENTIL) (2.5 MG/3ML) 0.083% nebulizer solution Take 3 mLs (2.5 mg total) by nebulization every 6 (six) hours as needed for wheezing or shortness of breath. 75 mL 12  . allopurinol (ZYLOPRIM) 300 MG tablet Take 1 tablet (300 mg total) by mouth daily. 90 tablet 3  . aspirin EC 81 MG tablet Take 81 mg by mouth daily.    Marland Kitchen atorvastatin (LIPITOR) 40 MG tablet Take 1 tablet (40 mg total) by mouth daily. 90 tablet 3  . Black Pepper-Turmeric (TURMERIC COMPLEX/BLACK PEPPER PO) Take 900 mg by mouth daily.     . Cholecalciferol (VITAMIN D PO) Take 15,000 Units by mouth daily.     Marland Kitchen  diphenhydrAMINE (BENADRYL) 25 MG tablet Take 25 mg by mouth at bedtime.    . docusate sodium (COLACE) 100 MG capsule Take 100 mg by mouth 2 (two) times daily. 1 tablet in the morning and 1 tablet at bedtime    . doxazosin (CARDURA) 4 MG tablet Take 1 tablet (4 mg total) by mouth daily. 90 tablet 3  . finasteride (PROSCAR) 5 MG tablet Take 1 tablet (5 mg total) by mouth daily. 90 tablet 3  . fluticasone (FLONASE) 50 MCG/ACT nasal spray Place 2 sprays into both nostrils at bedtime. PRN 16 g 2  . fluticasone furoate-vilanterol (BREO ELLIPTA) 100-25 MCG/INH AEPB Inhale 1 puff into the lungs daily. Rinse mouth with water after each use 1 each 0  . furosemide (LASIX) 40 MG tablet Take 1 tablet (40 mg total) by mouth daily. 90 tablet 3  . hydroxyurea (HYDREA) 500 MG capsule 1000 mg (2 caps)  on Monday/wednesday and Friday and 500mg  (1cap) the rest of the days of the week .May take with food to minimize nausea. 120 capsule 2  . levothyroxine (SYNTHROID, LEVOTHROID) 200 MCG tablet TAKE 1 TABLET EVERY DAY BEFORE BREAKFAST 90 tablet 3  . losartan (COZAAR) 50 MG tablet Take 1 tablet (50 mg total) by mouth daily. 90 tablet 3  . Magnesium 400 MG TABS Take 1 tablet by mouth daily.    . metoprolol succinate (TOPROL-XL) 50 MG 24 hr tablet TAKE 1 TABLET EVERY DAY WITH FOOD  OR  IMMEDIATELY FOLLOWING A MEAL (Patient taking differently: 100 mg. ) 90 tablet 3  . omeprazole (PRILOSEC) 20 MG capsule Take 1 capsule (20 mg total) by mouth 2 (two) times daily as needed. 180 capsule 1  . OVER THE COUNTER MEDICATION daily. Vitamin A to Z 50 plus    . polyethylene glycol (MIRALAX / GLYCOLAX) packet Take 17 g by mouth daily.    . silver sulfADIAZINE (SILVADENE) 1 % cream Apply 1 application topically daily. 50 g 2  . spironolactone (ALDACTONE) 25 MG tablet Take 1 tablet (25 mg total) by mouth daily. 90 tablet 0  . warfarin (COUMADIN) 5 MG tablet TAKE 1 AND 1/2 TABLETS EVERY DAY  OR AS DIRECTED 145 tablet 1   No current facility-administered medications on file prior to visit.     Allergies: Allergies  Allergen Reactions  . Other Swelling    SODIUM SENSITIVE    Current Problems (verified) has HTN (hypertension); Mixed hyperlipidemia; Prediabetes; Vitamin D deficiency; GERD ; BPH (benign prostatic hyperplasia); Medication management; Atrial fibrillation ([place]); Hypothyroidism; OSA (obstructive sleep apnea); Morbid obesity ([place]); ASHD/CABG x 3V (11/2013); Encounter for monitoring Coumadin therapy; Cardiac pacemaker/AICD (01/2014); Emphysema of lung ([place]); Tortuous aorta ([place]); Thrombocythemia ([place]); Senile purpura ([place]); and Thrombocytosis ([facility]) on their problem list.  Screening Tests Immunization History  Administered Date(s) Administered  . Influenza, High Dose Seasonal PF 04/23/2015, 02/18/2017,  03/29/2018  . Influenza,inj,Quad PF,6+ Mos 01/31/2016  . Pneumococcal Conjugate-13 11/21/2014  . Pneumococcal Polysaccharide-23 06/01/2017   Preventative care: Last colonoscopy: 2008  Cologuard: 10/2016 neg ECHO: 2018 CXR: 05/2017 - emphysematous changes, 50 pack year smoking history, not a candidate for low dose CT   Prior vaccinations: TD or Tdap: [place];  declines Influenza: 03/2018  Pneumococcal: 2019 Prevnar13: 2016 Shingles/Zostavax: Zostavax can't recall when  Names of Other Physician/Practitioners you currently use: 1. [facility] here for primary care 2. Dr. Vangie Bicker, eye doctor, last visit 2019 3. New dentist in [facility], dentist, last visit 2019  Patient Care  Team: [name], MD as PCP - General (Internal Medicine) Georg Ruddle, Ashok Cordia, MD as Referring Physician (Cardiology) Shirlee More Ronalee Red, MD as Referring Physician (Cardiology) Brunetta Genera, MD as Consulting Physician (Hematology)  Surgical: He  has a past surgical history that includes Coronary artery bypass graft (11/2013); lumb laminectomy [phone]); Lumbar fusion (2013); Cardiac defibrillator placement (07/2014); Cataract extraction w/ intraocular lens implant (Left); Nasal septoplasty w/ turbinoplasty (Bilateral, 01/30/2016); and Uvulectomy (N/A, 01/30/2016). Family His family history includes Alcohol abuse in his son; Alzheimer's disease in his mother; Depression in his mother; Heart disease (age of onset: 63) in his father; Mental illness in his mother; Stroke in his maternal grandmother. Social history  He reports that he quit smoking about 35 years ago. His smoking use included cigarettes. He has a 50.00 pack-year smoking history. He has never used smokeless tobacco. He reports previous alcohol use. He reports that he does not use drugs.  MEDICARE WELLNESS OBJECTIVES: Physical activity: Current Exercise Habits: The patient does not participate in  regular exercise at present, Exercise limited by: orthopedic condition(s) Cardiac risk factors: Cardiac Risk Factors include: advanced age (>19men, >6 women);dyslipidemia;male gender;hypertension;sedentary lifestyle;obesity (BMI >30kg/m2) Depression/mood screen:   Depression screen [facility] 2/9 06/29/2018  Decreased Interest 0  Down, Depressed, Hopeless 0  PHQ - 2 Score 0    ADLs:  In your present state of health, do you have any difficulty performing the following activities: 06/29/2018 03/28/2018  Hearing? N N  Vision? N N  Difficulty concentrating or making decisions? N N  Walking or climbing stairs? N N  Dressing or bathing? N N  Doing errands, shopping? N N  Some recent data might be hidden     Cognitive Testing  Alert? Yes  Normal Appearance?Yes  Oriented to person? Yes  Place? Yes   Time? Yes  Recall of three objects?  Yes  Can perform simple calculations? Yes  Displays appropriate judgment?Yes  Can read the correct time from a watch face?Yes  EOL planning: Does Patient Have a Medical Advance Directive?: Yes Type of Advance Directive: Healthcare Power of Attorney, Living will Does patient want to make changes to medical advance directive?: No - Patient declined Copy of [place] in Chart?: No - copy requested Would patient like information on creating a medical advance directive?: No - Patient declined   Objective:   Today's Vitals   06/29/18 1103  BP: 116/64  Pulse: 62  Temp: (!) 97.3 F (36.3 C)  SpO2: 97%  Weight: 259 lb (117.5 kg)  Height: 5\' 9"  (1.753 m)   Body mass index is 38.25 kg/m.  General appearance: alert, obese, no distress, WD/WN, male HEENT: normocephalic, sclerae anicteric, TMs pearly, nares patent, no discharge or erythema, pharynx normal Oral cavity: MMM, no lesions Neck: supple, no lymphadenopathy, no thyromegaly, no masses Heart: RRR, normal S1, S2, mild 2/6 systolic murmur Lungs: Present throughout, symmetrical, No  rhonchi, or rales or wheezing Abdomen: +bs, soft, non tender, non distended, no masses, no hepatomegaly, no splenomegaly Musculoskeletal: nontender, no swelling, no obvious deformity Extremities: no edema, no cyanosis, no clubbing Pulses: 2+ symmetric, upper and lower extremities, normal cap refill Neurological: alert, oriented x 3, CN2-12 intact, strength normal upper extremities and lower extremities, sensation normal throughout, DTRs 2+ throughout, no cerebellar signs, gait normal Psychiatric: normal affect, behavior normal, pleasant   Medicare Attestation I have personally reviewed: The patient's medical and social history Their use of alcohol, tobacco or illicit drugs Their current medications and supplements The patient's  functional ability including ADLs,fall risks, home safety risks, cognitive, and hearing and visual impairment Diet and physical activities Evidence for depression or mood disorders  The patient's weight, height, BMI, and visual acuity have been recorded in the chart.  I have made referrals, counseling, and provided education to the patient based on review of the above and I have provided the patient with a written personalized care plan for preventive services.     Izora Ribas, NP   06/29/2018

## 2018-06-29 ENCOUNTER — Encounter: Payer: Self-pay | Admitting: Adult Health

## 2018-06-29 ENCOUNTER — Ambulatory Visit (INDEPENDENT_AMBULATORY_CARE_PROVIDER_SITE_OTHER): Payer: Medicare Other | Admitting: Adult Health

## 2018-06-29 VITALS — BP 116/64 | HR 62 | Temp 97.3°F | Ht 69.0 in | Wt 259.0 lb

## 2018-06-29 DIAGNOSIS — Z79899 Other long term (current) drug therapy: Secondary | ICD-10-CM

## 2018-06-29 DIAGNOSIS — R6889 Other general symptoms and signs: Secondary | ICD-10-CM

## 2018-06-29 DIAGNOSIS — Z950 Presence of cardiac pacemaker: Secondary | ICD-10-CM

## 2018-06-29 DIAGNOSIS — I480 Paroxysmal atrial fibrillation: Secondary | ICD-10-CM

## 2018-06-29 DIAGNOSIS — I2510 Atherosclerotic heart disease of native coronary artery without angina pectoris: Secondary | ICD-10-CM | POA: Diagnosis not present

## 2018-06-29 DIAGNOSIS — G4733 Obstructive sleep apnea (adult) (pediatric): Secondary | ICD-10-CM

## 2018-06-29 DIAGNOSIS — E039 Hypothyroidism, unspecified: Secondary | ICD-10-CM | POA: Diagnosis not present

## 2018-06-29 DIAGNOSIS — D75839 Thrombocytosis, unspecified: Secondary | ICD-10-CM

## 2018-06-29 DIAGNOSIS — I10 Essential (primary) hypertension: Secondary | ICD-10-CM | POA: Diagnosis not present

## 2018-06-29 DIAGNOSIS — D473 Essential (hemorrhagic) thrombocythemia: Secondary | ICD-10-CM

## 2018-06-29 DIAGNOSIS — R7303 Prediabetes: Secondary | ICD-10-CM

## 2018-06-29 DIAGNOSIS — I771 Stricture of artery: Secondary | ICD-10-CM

## 2018-06-29 DIAGNOSIS — N401 Enlarged prostate with lower urinary tract symptoms: Secondary | ICD-10-CM

## 2018-06-29 DIAGNOSIS — K219 Gastro-esophageal reflux disease without esophagitis: Secondary | ICD-10-CM | POA: Diagnosis not present

## 2018-06-29 DIAGNOSIS — D692 Other nonthrombocytopenic purpura: Secondary | ICD-10-CM

## 2018-06-29 DIAGNOSIS — J439 Emphysema, unspecified: Secondary | ICD-10-CM

## 2018-06-29 DIAGNOSIS — E782 Mixed hyperlipidemia: Secondary | ICD-10-CM

## 2018-06-29 DIAGNOSIS — Z0001 Encounter for general adult medical examination with abnormal findings: Secondary | ICD-10-CM

## 2018-06-29 DIAGNOSIS — E559 Vitamin D deficiency, unspecified: Secondary | ICD-10-CM

## 2018-06-29 DIAGNOSIS — Z0000 Encounter for general adult medical examination without abnormal findings: Secondary | ICD-10-CM

## 2018-06-29 NOTE — Patient Instructions (Signed)
[name] [name] , Thank you for taking time to come for your Medicare Wellness Visit. I appreciate your ongoing commitment to your health goals. Please review the following plan we discussed and let me know if I can assist you in the future.   These are the goals we discussed: Goals    . Exercise 150 min/wk Moderate Activity    . Weight (lb) < 250 lb (113.4 kg)       This is a list of the screening recommended for you and due dates:  Health Maintenance  Topic Date Due  . Tetanus Vaccine  09/25/1959  . Flu Shot  Completed  . Pneumonia vaccines  Completed     Aim for 7+ servings of fruits and vegetables daily  65-80+ fluid ounces of water or unsweet tea for healthy kidneys  Limit to max 1 drink of alcohol per day; avoid smoking/tobacco  Limit animal fats in diet for cholesterol and heart health - choose grass fed whenever available  Avoid highly processed foods, and foods high in saturated/trans fats  Aim for low stress - take time to unwind and care for your mental health  Aim for 150 min of moderate intensity exercise weekly for heart health, and weights twice weekly for bone health  Aim for 7-9 hours of sleep daily     Preventing High Cholesterol Cholesterol is a waxy, fat-like substance that your body needs in small amounts. Your liver makes all the cholesterol that your body needs. Having high cholesterol (hypercholesterolemia) increases your risk for heart disease and stroke. Extra (excess) cholesterol comes from the food you eat, such as animal-based fat (saturated fat) from meat and some dairy products. High cholesterol can often be prevented with diet and lifestyle changes. If you already have high cholesterol, you can control it with diet and lifestyle changes, as well as medicine. What nutrition changes can be made?  Eat less saturated fat. Foods that contain saturated fat include red meat and some dairy products.  Avoid processed meats, like bacon and lunch  meats.  Avoid trans fats, which are found in margarine and some baked goods.  Avoid foods and beverages that have added sugars.  Eat more fruits, vegetables, and whole grains.  Choose healthy sources of protein, such as fish, poultry, and nuts.  Choose healthy sources of fat, such as: ? Nuts. ? Vegetable oils, especially olive oil. ? Fish that have healthy fats (omega-3 fatty acids), such as mackerel or salmon. What lifestyle changes can be made?   Lose weight if you are overweight. Losing 5-10 lb (2.3-4.5 kg) can help prevent or control high cholesterol and reduce your risk for diabetes and high blood pressure. Ask your health care provider to help you with a diet and exercise plan to safely lose weight.  Get enough exercise. Do at least 150 minutes of moderate-intensity exercise each week. ? You could do this in short exercise sessions several times a day, or you could do longer exercise sessions a few times a week. For example, you could take a brisk 10-minute walk or bike ride, 3 times a day, for 5 days a week.  Do not smoke. If you need help quitting, ask your health care provider.  Limit your alcohol intake. If you drink alcohol, limit alcohol intake to no more than 1 drink a day for nonpregnant women and 2 drinks a day for men. One drink equals 12 oz of beer, 5 oz of wine, or 1 oz of hard liquor. Why are  these changes important?  If you have high cholesterol, deposits (plaques) may build up on the walls of your blood vessels. Plaques make the arteries narrower and stiffer, which can restrict or block blood flow and cause blood clots to form. This greatly increases your risk for heart attack and stroke. Making diet and lifestyle changes can reduce your risk for these life-threatening conditions. What can I do to lower my risk?  Manage your risk factors for high cholesterol. Talk with your health care provider about all of your risk factors and how to lower your risk.  Manage  other conditions that you have, such as diabetes or high blood pressure (hypertension).  Have your cholesterol checked at regular intervals.  Keep all follow-up visits as told by your health care provider. This is important. How is this treated? In addition to diet and lifestyle changes, your health care provider may recommend medicines to help lower cholesterol, such as a medicine to reduce the amount of cholesterol made in your liver. You may need medicine if:  Diet and lifestyle changes do not lower your cholesterol enough.  You have high cholesterol and other risk factors for heart disease or stroke. Take over-the-counter and prescription medicines only as told by your health care provider. Where to find more information  American Heart Association: ThisTune.com.pt.jsp  National Heart, Lung, and Blood Institute: FrenchToiletries.com.cy Summary  High cholesterol increases your risk for heart disease and stroke. By keeping your cholesterol level low, you can reduce your risk for these conditions.  Diet and lifestyle changes are the most important steps in preventing high cholesterol.  Work with your health care provider to manage your risk factors, and have your blood tested regularly. This information is not intended to replace advice given to you by your health care provider. Make sure you discuss any questions you have with your health care provider. Document Released: 05/20/2015 Document Revised: 01/12/2016 Document Reviewed: 01/12/2016 Elsevier Interactive Patient Education  2019 [employer].

## 2018-06-30 ENCOUNTER — Other Ambulatory Visit: Payer: Self-pay | Admitting: Adult Health

## 2018-06-30 DIAGNOSIS — I255 Ischemic cardiomyopathy: Secondary | ICD-10-CM | POA: Diagnosis not present

## 2018-06-30 DIAGNOSIS — I480 Paroxysmal atrial fibrillation: Secondary | ICD-10-CM | POA: Diagnosis not present

## 2018-06-30 DIAGNOSIS — E039 Hypothyroidism, unspecified: Secondary | ICD-10-CM

## 2018-06-30 DIAGNOSIS — Z7901 Long term (current) use of anticoagulants: Secondary | ICD-10-CM | POA: Diagnosis not present

## 2018-06-30 DIAGNOSIS — Z95810 Presence of automatic (implantable) cardiac defibrillator: Secondary | ICD-10-CM | POA: Diagnosis not present

## 2018-06-30 LAB — CBC WITH DIFFERENTIAL/PLATELET
Absolute Monocytes: 585 cells/uL (ref 200–950)
Basophils Absolute: 210 cells/uL — ABNORMAL HIGH (ref 0–200)
Basophils Relative: 1.4 %
Eosinophils Absolute: 165 cells/uL (ref 15–500)
Eosinophils Relative: 1.1 %
HCT: 49.9 % (ref 38.5–50.0)
Hemoglobin: 16.7 g/dL (ref 13.2–17.1)
Lymphs Abs: 1650 cells/uL (ref 850–3900)
MCH: 29.8 pg (ref 27.0–33.0)
MCHC: 33.5 g/dL (ref 32.0–36.0)
MCV: 89.1 fL (ref 80.0–100.0)
MPV: 11.3 fL (ref 7.5–12.5)
Monocytes Relative: 3.9 %
Neutro Abs: 12390 cells/uL — ABNORMAL HIGH (ref 1500–7800)
Neutrophils Relative %: 82.6 %
Platelets: 380 10*3/uL (ref 140–400)
RBC: 5.6 10*6/uL (ref 4.20–5.80)
RDW: 19.5 % — ABNORMAL HIGH (ref 11.0–15.0)
Total Lymphocyte: 11 %
WBC: 15 10*3/uL — ABNORMAL HIGH (ref 3.8–10.8)

## 2018-06-30 LAB — COMPLETE METABOLIC PANEL WITH GFR
AG Ratio: 2.1 (calc) (ref 1.0–2.5)
ALT: 24 U/L (ref 9–46)
AST: 29 U/L (ref 10–35)
Albumin: 4.6 g/dL (ref 3.6–5.1)
Alkaline phosphatase (APISO): 69 U/L (ref 35–144)
BUN: 20 mg/dL (ref 7–25)
CO2: 31 mmol/L (ref 20–32)
Calcium: 9.9 mg/dL (ref 8.6–10.3)
Chloride: 97 mmol/L — ABNORMAL LOW (ref 98–110)
Creat: 1.04 mg/dL (ref 0.70–1.18)
GFR, Est African American: 80 mL/min/{1.73_m2} (ref >=60)
GFR, Est Non African American: 69 mL/min/{1.73_m2} (ref >=60)
Globulin: 2.2 g/dL (calc) (ref 1.9–3.7)
Glucose, Bld: 89 mg/dL (ref 65–99)
POTASSIUM: 4.7 mmol/L (ref 3.5–5.3)
Sodium: 136 mmol/L (ref 135–146)
Total Bilirubin: 1.1 mg/dL (ref 0.2–1.2)
Total Protein: 6.8 g/dL (ref 6.1–8.1)

## 2018-06-30 LAB — LIPID PANEL
Cholesterol: 149 mg/dL (ref <200)
HDL: 25 mg/dL — AB (ref >=40)
LDL Cholesterol (Calc): 96 mg/dL (calc)
Non-HDL Cholesterol (Calc): 124 mg/dL (calc) (ref <130)
Total CHOL/HDL Ratio: 6 (calc) — ABNORMAL HIGH (ref <5.0)
Triglycerides: 187 mg/dL — ABNORMAL HIGH (ref <150)

## 2018-06-30 LAB — HEMOGLOBIN A1C
EAG (MMOL/L): 6.6 (calc)
Hgb A1c MFr Bld: 5.8 % of total Hgb — ABNORMAL HIGH (ref <5.7)
Mean Plasma Glucose: 120 (calc)

## 2018-06-30 LAB — TSH: TSH: 7.16 m[IU]/L — AB (ref 0.40–4.50)

## 2018-06-30 LAB — MAGNESIUM: Magnesium: 1.7 mg/dL (ref 1.5–2.5)

## 2018-06-30 MED ORDER — LEVOTHYROXINE SODIUM 200 MCG PO TABS: ORAL_TABLET | ORAL | 3 refills | Status: DC

## 2018-07-02 ENCOUNTER — Telehealth: Payer: Self-pay | Admitting: Hematology

## 2018-07-02 NOTE — Telephone Encounter (Signed)
Patient called to reschedule  °

## 2018-07-14 NOTE — Progress Notes (Signed)
HEMATOLOGY/ONCOLOGY CLINIC NOTE  Date of Service: 07/15/2018  Patient Care Team: [name], MD as PCP - General (Internal Medicine) Georg Ruddle, Ashok Cordia, MD as Referring Physician (Cardiology) Shirlee More Ronalee Red, MD as Referring Physician (Cardiology) Brunetta Genera, MD as Consulting Physician (Hematology)  CHIEF COMPLAINTS/PURPOSE OF CONSULTATION:  F/u for MPN  HISTORY OF PRESENTING ILLNESS:   [name] [name] is a wonderful 78 y.o. male who has been referred to [place] by Dr. [name]  for evaluation and management of Thrombocytosis. He is accompanied today by his wife. The pt reports that he is doing well overall.   The pt reports that he has never had any blood clots.  He has atrial fibrillation and has taken Coumadin since 2015. He had heart surgery in 2015, and had his ICD placed in 2016. The pt also takes 81g aspirin daily.   The pt notes that he has not been aware of his high platelets previously, but only as of his most recent labs. He denies any recent major bleeds, surgeries, and other trauma. He denies having a splenectomy at any time as well.   The pt notes that his right kidney gets sore from time to time when he doesn't stay well hydrated. He also notes that when he urinates at these times, his urine is very dark. The pt adds that he had kidney stones one time. The pt is also taking Lasix and notes that he urinates frequently.   The pt adds that his gums have been very sore, even to touch from his tongue.  The pt notes that he does not want to use a CPAP after a bad experience with a previous sleep study that did diagnose him with sleep apnea. He notes that he falls asleep several times a day as well, falling asleep easily when he sits down, and denying falling asleep while driving. He also notes some increased difficulty remembering.   Most recent lab results (12/09/17) of CBC is as follows: all values are WNL except for WBC at 11.9k, RDW at 18.4, PLT at  649k, ANC at 9.7k.  On review of systems, pt reports good energy levels, falling asleep several times a day, staying active, and denies recent surgery, recent trauma, bleeding, unexpected weight loss, pain along the spine, abdominal pains, and any other symptoms.   On Family Hx the pt reports prostate cancer.   Interval History:   [name] returns today for management and evaluation of his newly diagnosed Essential thrombocytosis. The patient's last visit with [place] was on 05/26/18. The pt reports that he is doing well overall.   The pt reports that he has not developed any new concerns in the interim and endorses stable energy levels. He has continued on his water pill. He notes that he "probably," doesn't drink enough water. He adds that he gets "a lot of carmps," which occur in his hamstrings.  The pt was increased on Metoprolol in the interim.  Lab results today (07/15/18) of CBC w/diff and CMP is as follows: all values are WNL except for WBC at 14.6k, RDW at 19.0, ANC at 12.0k, Basophils abs at 200, Abs immature granulocytes at 0.14k, Glucose at 107, Total Bilirubin at 1.4. 07/15/18 LDH is pending  On review of systems, pt reports stable energy levels, and denies staying well hydrated, abdominal pains, and any other symptoms.  MEDICAL HISTORY:  Past Medical History:  Diagnosis Date  . A-fib ([place]) 01/2014  . AICD (automatic cardioverter/defibrillator) present  [employer]  . Arthritis   . ASHD (arteriosclerotic heart disease) 2015   s/p CABG  . Cataract    s/p left  . COPD (chronic obstructive pulmonary disease) ([place])    by CXR, pt unsure of this  . GERD (gastroesophageal reflux disease)   . Gout 2014  . Hyperlipidemia   . Hypertension   . Hypothyroidism   . LBBB (left bundle branch block)   . Pre-diabetes   . Prediabetes 01/2014  . Presence of permanent cardiac pacemaker   . Sleep apnea    no CPAP  . Thyroid disease    hypothyroid    SURGICAL HISTORY: Past  Surgical History:  Procedure Laterality Date  . CARDIAC DEFIBRILLATOR PLACEMENT  07/2014  . CATARACT EXTRACTION W/ INTRAOCULAR LENS IMPLANT Left   . CORONARY ARTERY BYPASS GRAFT  11/2013  . lumb laminectomy  [phone]   x 3  . LUMBAR FUSION  2013  . NASAL SEPTOPLASTY W/ TURBINOPLASTY Bilateral 01/30/2016   Procedure: NASAL SEPTOPLASTY WITH BILATERAL TURBINATE REDUCTION;  Surgeon: Rozetta Nunnery, MD;  Location: [facility];  Service: ENT;  Laterality: Bilateral;  . UVULECTOMY N/A 01/30/2016   Procedure: UVULECTOMY;  Surgeon: Rozetta Nunnery, MD;  Location: [facility];  Service: ENT;  Laterality: N/A;    SOCIAL HISTORY: Social History   Socioeconomic History  . Marital status: Married    Spouse name: [name]   . Number of children: Not on file  . Years of education: 54  . Highest education level: Not on file  Occupational History  . Occupation: Retired  Scientific laboratory technician  . Financial resource strain: Not on file  . Food insecurity:    Worry: Not on file    Inability: Not on file  . Transportation needs:    Medical: Not on file    Non-medical: Not on file  Tobacco Use  . Smoking status: Former Smoker    Packs/day: 2.00    Years: 25.00    Pack years: 50.00    Types: Cigarettes    Last attempt to quit: 05/20/1983    Years since quitting: 35.1  . Smokeless tobacco: Never Used  Substance and Sexual Activity  . Alcohol use: Not Currently    Alcohol/week: 0.0 standard drinks  . Drug use: No  . Sexual activity: Not on file  Lifestyle  . Physical activity:    Days per week: Not on file    Minutes per session: Not on file  . Stress: Not on file  Relationships  . Social connections:    Talks on phone: Not on file    Gets together: Not on file    Attends religious service: Not on file    Active member of club or organization: Not on file    Attends meetings of clubs or organizations: Not on file    Relationship status: Not on file  . Intimate partner violence:    Fear of current  or ex partner: Not on file    Emotionally abused: Not on file    Physically abused: Not on file    Forced sexual activity: Not on file  Other Topics Concern  . Not on file  Social History Narrative   Lives with wife, [name]   Caffeine use: daily (Coffee)    FAMILY HISTORY: Family History  Problem Relation Age of Onset  . Alzheimer's disease Mother   . Mental illness Mother   . Depression Mother   . Heart disease Father 9  had 5 MI's & died 84 yo  . Alcohol abuse Son   . Stroke Maternal Grandmother     ALLERGIES:  is allergic to other.  MEDICATIONS:  Current Outpatient Medications  Medication Sig Dispense Refill  . acetaminophen (TYLENOL) 500 MG tablet Take 500 mg by mouth every 6 (six) hours as needed for mild pain.     Marland Kitchen albuterol (PROVENTIL HFA;VENTOLIN HFA) 108 (90 Base) MCG/ACT inhaler Inhale 2 puffs into the lungs every 6 (six) hours as needed for wheezing or shortness of breath. 1 Inhaler 3  . albuterol (PROVENTIL) (2.5 MG/3ML) 0.083% nebulizer solution Take 3 mLs (2.5 mg total) by nebulization every 6 (six) hours as needed for wheezing or shortness of breath. 75 mL 12  . allopurinol (ZYLOPRIM) 300 MG tablet Take 1 tablet (300 mg total) by mouth daily. 90 tablet 3  . aspirin EC 81 MG tablet Take 81 mg by mouth daily.    Marland Kitchen atorvastatin (LIPITOR) 40 MG tablet Take 1 tablet (40 mg total) by mouth daily. 90 tablet 3  . Black Pepper-Turmeric (TURMERIC COMPLEX/BLACK PEPPER PO) Take 900 mg by mouth daily.     . Cholecalciferol (VITAMIN D PO) Take 15,000 Units by mouth daily.     . diphenhydrAMINE (BENADRYL) 25 MG tablet Take 25 mg by mouth at bedtime.    . docusate sodium (COLACE) 100 MG capsule Take 100 mg by mouth 2 (two) times daily. 1 tablet in the morning and 1 tablet at bedtime    . doxazosin (CARDURA) 4 MG tablet Take 1 tablet (4 mg total) by mouth daily. 90 tablet 3  . finasteride (PROSCAR) 5 MG tablet Take 1 tablet (5 mg total) by mouth daily. 90 tablet 3  .  fluticasone (FLONASE) 50 MCG/ACT nasal spray Place 2 sprays into both nostrils at bedtime. PRN 16 g 2  . fluticasone furoate-vilanterol (BREO ELLIPTA) 100-25 MCG/INH AEPB Inhale 1 puff into the lungs daily. Rinse mouth with water after each use 1 each 0  . furosemide (LASIX) 40 MG tablet Take 1 tablet (40 mg total) by mouth daily. 90 tablet 3  . hydroxyurea (HYDREA) 500 MG capsule 1000 mg (2 caps) on Monday/wednesday and Friday and 500mg  (1cap) the rest of the days of the week .May take with food to minimize nausea. 120 capsule 2  . levothyroxine (SYNTHROID, LEVOTHROID) 200 MCG tablet TAKE 1 TABLET EVERY DAY BEFORE BREAKFAST EXCEPT Sunday TAKE 1.5 TABS. 96 tablet 3  . losartan (COZAAR) 50 MG tablet Take 1 tablet (50 mg total) by mouth daily. 90 tablet 3  . Magnesium 400 MG TABS Take 1 tablet by mouth daily.    . metoprolol succinate (TOPROL-XL) 50 MG 24 hr tablet TAKE 1 TABLET EVERY DAY WITH FOOD  OR  IMMEDIATELY FOLLOWING A MEAL (Patient taking differently: 100 mg. ) 90 tablet 3  . omeprazole (PRILOSEC) 20 MG capsule Take 1 capsule (20 mg total) by mouth 2 (two) times daily as needed. 180 capsule 1  . OVER THE COUNTER MEDICATION daily. Vitamin A to Z 50 plus    . polyethylene glycol (MIRALAX / GLYCOLAX) packet Take 17 g by mouth daily.    . silver sulfADIAZINE (SILVADENE) 1 % cream Apply 1 application topically daily. 50 g 2  . spironolactone (ALDACTONE) 25 MG tablet Take 1 tablet (25 mg total) by mouth daily. 90 tablet 0  . warfarin (COUMADIN) 5 MG tablet TAKE 1 AND 1/2 TABLETS EVERY DAY  OR AS DIRECTED 145 tablet 1   No  current facility-administered medications for this visit.     REVIEW OF SYSTEMS:    A 10+ POINT REVIEW OF SYSTEMS WAS OBTAINED including neurology, dermatology, psychiatry, cardiac, respiratory, lymph, extremities, GI, GU, Musculoskeletal, constitutional, breasts, reproductive, HEENT.  All pertinent positives are noted in the HPI.  All others are negative.   PHYSICAL  EXAMINATION: . Vitals:   07/15/18 1415  BP: 130/71  Pulse: (!) 59  Resp: 18  Temp: 98.4 F (36.9 C)  SpO2: 96%   Filed Weights   07/15/18 1415  Weight: 260 lb 3.2 oz (118 kg)   .Body mass index is 38.42 kg/m.  GENERAL:alert, in no acute distress and comfortable SKIN: no acute rashes, no significant lesions EYES: conjunctiva are pink and non-injected, sclera anicteric OROPHARYNX: MMM, no exudates, no oropharyngeal erythema or ulceration NECK: supple, no JVD LYMPH:  no palpable lymphadenopathy in the cervical, axillary or inguinal regions LUNGS: clear to auscultation b/l with normal respiratory effort HEART: regular rate & rhythm ABDOMEN:  normoactive bowel sounds , non tender, not distended. No palpable hepatosplenomegaly.  Extremity: 2+ pedal edema PSYCH: alert & oriented x 3 with fluent speech NEURO: no focal motor/sensory deficits   LABORATORY DATA:  I have reviewed the data as listed  . CBC Latest Ref Rng & Units 07/15/2018 06/29/2018 05/26/2018  WBC 4.0 - 10.5 K/uL 14.6(H) 15.0(H) 14.4(H)  Hemoglobin 13.0 - 17.0 g/dL 16.3 16.7 16.1  Hematocrit 39.0 - 52.0 % 51.9 49.9 51.9  Platelets 150 - 400 K/uL 368 380 381    . CMP Latest Ref Rng & Units 07/15/2018 06/29/2018 05/26/2018  Glucose 70 - 99 mg/dL 107(H) 89 98  BUN 8 - 23 mg/dL 19 20 17   Creatinine 0.61 - 1.24 mg/dL 1.13 1.04 1.12  Sodium 135 - 145 mmol/L 137 136 137  Potassium 3.5 - 5.1 mmol/L 4.7 4.7 4.7  Chloride 98 - 111 mmol/L 98 97(L) 100  CO2 22 - 32 mmol/L 31 31 30   Calcium 8.9 - 10.3 mg/dL 9.5 9.9 9.6  Total Protein 6.5 - 8.1 g/dL 6.8 6.8 6.8  Total Bilirubin 0.3 - 1.2 mg/dL 1.4(H) 1.1 1.0  Alkaline Phos 38 - 126 U/L 66 - 74  AST 15 - 41 U/L 32 29 25  ALT 0 - 44 U/L 23 24 22    02/09/18 JAK2 Mutation Study:    02/09/18 BCR ABL FISH:    RADIOGRAPHIC STUDIES: I have personally reviewed the radiological images as listed and agreed with the findings in the report. No results found.  ASSESSMENT & PLAN:    78 y.o. male with  1. 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 Some associated leukocytosis. No history of splenectomy. No previous h/o VTE, but significant cardiac risk factors. 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   02/09/18 BCR ABL FISH revealed no evidence of BCR/ABL rearrangement   PLAN: -Discussed pt labwork today, 07/15/18; PLT normal at 368k, HGB at 16.3, HCT a little higher at 51.9%, ANC stable at 12.0k -Discussed that given the patient's known mutation and HCT elevation, it would be reasonable to consider therapeutic phlebotomies for further risk reduction of clots, though will need to balance this consideration with his fluid status -Do also expect that  there is some element of hemoconcentration with the pt being on water pills and denying staying well hydrated -The pt will consider this further but would like to tentatively set up a therapeutic phlebotomy in 2 months -Avoid iron supplements -The pt has no prohibitive toxicities from continuing 1000mg  Hydroxyurea 3 days a week, and 500mg  Hydroxyurea the other 4 days a week, at this time.   -PLT currently at goal, which is to be between 200-400k -Recommend pt decrease laxatives given 4 bowel movements a day -Recommended sun protection strategies as Hydroxyurea can increase sensitivity of skin, and that he should watch for mouth sores as well  -Will see the pt back in 2 months  2. . Patient Active Problem List   Diagnosis Date Noted  . Thrombocytosis ([place]) 12/29/2017  . Senile purpura ([place]) 09/01/2017  . Emphysema of lung ([place]) 06/01/2017  . Tortuous aorta ([facility]) 06/01/2017    . ASHD/CABG x 3V (11/2013) 02/06/2015  . Encounter for monitoring Coumadin therapy 02/06/2015  . Cardiac pacemaker/AICD (01/2014) 02/06/2015  . Morbid obesity ([place]) 01/03/2015  . HTN (hypertension) 01/02/2015  . Mixed hyperlipidemia 01/02/2015  . Prediabetes 01/02/2015  . Vitamin D deficiency 01/02/2015  . GERD  01/02/2015  . BPH (benign prostatic hyperplasia) 01/02/2015  . Medication management 01/02/2015  . Atrial fibrillation ([place]) 01/02/2015  . Hypothyroidism 01/02/2015  . OSA (obstructive sleep apnea) 01/02/2015  -continue f/u with PCP to optimize atherosclerotic risk factors.  . Orders Placed This Encounter  Procedures  . CBC with Differential/Platelet    Standing Status:   Future    Standing Expiration Date:   08/19/2019  . CMP ([place] only)    Standing Status:   Future    Standing Expiration Date:   07/16/2019  . Lactate dehydrogenase    Standing Status:   Future    Standing Expiration Date:   07/16/2019    RTC with Dr Irene Limbo with labs in 69months. Plz schedule therapeutic Phlebotomy day of clinic visit in 32months   All of the patients questions were answered with apparent satisfaction. The patient knows to call the clinic with any problems, questions or concerns.  The total time spent in the appt was 25 minutes and more than 50% was on counseling and direct patient cares.    Sullivan Lone MD MS AAHIVMS [facility] Hematology/Oncology Physician [facility]  (Office):       [phone] (Work cell):  [phone] (Fax):           [phone]  07/15/2018 2:45 PM  I, Baldwin [place], am acting as a scribe for Dr. Sullivan Lone.   .I have reviewed the above documentation for accuracy and completeness, and I agree with the above. Brunetta Genera MD

## 2018-07-15 ENCOUNTER — Inpatient Hospital Stay: Payer: Medicare Other | Attending: Hematology

## 2018-07-15 ENCOUNTER — Inpatient Hospital Stay (HOSPITAL_BASED_OUTPATIENT_CLINIC_OR_DEPARTMENT_OTHER): Payer: Medicare Other | Admitting: Hematology

## 2018-07-15 ENCOUNTER — Telehealth: Payer: Self-pay | Admitting: Hematology

## 2018-07-15 VITALS — BP 130/71 | HR 59 | Temp 98.4°F | Resp 18 | Ht 69.0 in | Wt 260.2 lb

## 2018-07-15 DIAGNOSIS — D473 Essential (hemorrhagic) thrombocythemia: Secondary | ICD-10-CM

## 2018-07-15 DIAGNOSIS — D72829 Elevated white blood cell count, unspecified: Secondary | ICD-10-CM

## 2018-07-15 DIAGNOSIS — Z79899 Other long term (current) drug therapy: Secondary | ICD-10-CM

## 2018-07-15 DIAGNOSIS — Z1589 Genetic susceptibility to other disease: Secondary | ICD-10-CM

## 2018-07-15 DIAGNOSIS — D471 Chronic myeloproliferative disease: Secondary | ICD-10-CM

## 2018-07-15 LAB — CBC WITH DIFFERENTIAL/PLATELET
Abs Immature Granulocytes: 0.14 10*3/uL — ABNORMAL HIGH (ref 0.00–0.07)
Basophils Absolute: 0.2 10*3/uL — ABNORMAL HIGH (ref 0.0–0.1)
Basophils Relative: 2 %
Eosinophils Absolute: 0.1 10*3/uL (ref 0.0–0.5)
Eosinophils Relative: 1 %
HEMATOCRIT: 51.9 % (ref 39.0–52.0)
Hemoglobin: 16.3 g/dL (ref 13.0–17.0)
Immature Granulocytes: 1 %
Lymphocytes Relative: 11 %
Lymphs Abs: 1.6 10*3/uL (ref 0.7–4.0)
MCH: 29.8 pg (ref 26.0–34.0)
MCHC: 31.4 g/dL (ref 30.0–36.0)
MCV: 94.9 fL (ref 80.0–100.0)
Monocytes Absolute: 0.5 10*3/uL (ref 0.1–1.0)
Monocytes Relative: 4 %
Neutro Abs: 12 10*3/uL — ABNORMAL HIGH (ref 1.7–7.7)
Neutrophils Relative %: 81 %
Platelets: 368 10*3/uL (ref 150–400)
RBC: 5.47 MIL/uL (ref 4.22–5.81)
RDW: 19 % — ABNORMAL HIGH (ref 11.5–15.5)
WBC: 14.6 10*3/uL — AB (ref 4.0–10.5)
nRBC: 0.1 % (ref 0.0–0.2)

## 2018-07-15 LAB — CMP (CANCER CENTER ONLY)
ALT: 23 U/L (ref 0–44)
ANION GAP: 8 (ref 5–15)
AST: 32 U/L (ref 15–41)
Albumin: 4.3 g/dL (ref 3.5–5.0)
Alkaline Phosphatase: 66 U/L (ref 38–126)
BUN: 19 mg/dL (ref 8–23)
CALCIUM: 9.5 mg/dL (ref 8.9–10.3)
CO2: 31 mmol/L (ref 22–32)
Chloride: 98 mmol/L (ref 98–111)
Creatinine: 1.13 mg/dL (ref 0.61–1.24)
GFR, Est AFR Am: >60 mL/min (ref >60)
GFR, Estimated: >60 mL/min (ref >60)
Glucose, Bld: 107 mg/dL — ABNORMAL HIGH (ref 70–99)
Potassium: 4.7 mmol/L (ref 3.5–5.1)
Sodium: 137 mmol/L (ref 135–145)
Total Bilirubin: 1.4 mg/dL — ABNORMAL HIGH (ref 0.3–1.2)
Total Protein: 6.8 g/dL (ref 6.5–8.1)

## 2018-07-15 LAB — LACTATE DEHYDROGENASE: LDH: 425 U/L — AB (ref 98–192)

## 2018-07-15 NOTE — Telephone Encounter (Signed)
Scheduled appt per 02/27 los.  Printed avs.

## 2018-07-20 DIAGNOSIS — H0100A Unspecified blepharitis right eye, upper and lower eyelids: Secondary | ICD-10-CM | POA: Diagnosis not present

## 2018-07-20 DIAGNOSIS — H26492 Other secondary cataract, left eye: Secondary | ICD-10-CM | POA: Diagnosis not present

## 2018-07-20 DIAGNOSIS — H0100B Unspecified blepharitis left eye, upper and lower eyelids: Secondary | ICD-10-CM | POA: Diagnosis not present

## 2018-07-20 LAB — HM DIABETES EYE EXAM

## 2018-07-21 DIAGNOSIS — I255 Ischemic cardiomyopathy: Secondary | ICD-10-CM | POA: Diagnosis not present

## 2018-07-23 ENCOUNTER — Encounter: Payer: Self-pay | Admitting: *Deleted

## 2018-07-23 DIAGNOSIS — M25522 Pain in left elbow: Secondary | ICD-10-CM | POA: Diagnosis not present

## 2018-07-26 ENCOUNTER — Ambulatory Visit: Payer: Medicare Other | Admitting: Hematology

## 2018-07-26 ENCOUNTER — Other Ambulatory Visit: Payer: Medicare Other

## 2018-07-28 ENCOUNTER — Ambulatory Visit: Payer: Medicare Other

## 2018-08-03 ENCOUNTER — Ambulatory Visit: Payer: Medicare Other

## 2018-08-04 DIAGNOSIS — Z7901 Long term (current) use of anticoagulants: Secondary | ICD-10-CM | POA: Diagnosis not present

## 2018-08-04 DIAGNOSIS — I480 Paroxysmal atrial fibrillation: Secondary | ICD-10-CM | POA: Diagnosis not present

## 2018-08-04 DIAGNOSIS — Z95810 Presence of automatic (implantable) cardiac defibrillator: Secondary | ICD-10-CM | POA: Diagnosis not present

## 2018-08-04 DIAGNOSIS — I255 Ischemic cardiomyopathy: Secondary | ICD-10-CM | POA: Diagnosis not present

## 2018-09-13 ENCOUNTER — Other Ambulatory Visit: Payer: Self-pay | Admitting: Internal Medicine

## 2018-09-13 ENCOUNTER — Ambulatory Visit: Payer: Medicare Other | Admitting: Hematology

## 2018-09-13 ENCOUNTER — Other Ambulatory Visit: Payer: Medicare Other

## 2018-09-21 DIAGNOSIS — I480 Paroxysmal atrial fibrillation: Secondary | ICD-10-CM | POA: Diagnosis not present

## 2018-09-21 DIAGNOSIS — Z7901 Long term (current) use of anticoagulants: Secondary | ICD-10-CM | POA: Diagnosis not present

## 2018-09-21 DIAGNOSIS — I255 Ischemic cardiomyopathy: Secondary | ICD-10-CM | POA: Diagnosis not present

## 2018-09-21 DIAGNOSIS — Z95810 Presence of automatic (implantable) cardiac defibrillator: Secondary | ICD-10-CM | POA: Diagnosis not present

## 2018-09-23 ENCOUNTER — Telehealth: Payer: Self-pay

## 2018-09-23 NOTE — Telephone Encounter (Signed)
Patient has itchy rash on arm. He found an unused bottle of Prednisone and started taking that yesterday, wants to know if its ok to take with his other meds.

## 2018-09-23 NOTE — Telephone Encounter (Signed)
Patient notified

## 2018-09-28 ENCOUNTER — Encounter: Payer: Self-pay | Admitting: Internal Medicine

## 2018-09-28 NOTE — Progress Notes (Signed)
History of Present Illness:      This very nice 78 y.o. MWM presents for 3 month follow up with HTN, ASCAD/cAfib/PPM,  HLD, Pre-Diabetes and Vitamin D Deficiency. Hx/o OSA untreated due to mask intolerance. His Gout is in control. Patient is followed by Dr Irene Limbo for MPD/Thrombocythemia.       Patient is treated for HTN ( age 63 yo - 17)  & BP has been controlled at home. Today's BP is at goal - 124/80. He underwent CABG with new Afib failing CV in 2015.  In 2016 had a BiVPM/AICD/Defib implanted & he's followed at [facility]. Patient denies any cardiac type chest pain, palpitations, dyspnea / orthopnea / PND, dizziness, claudication, or dependent edema.      Hyperlipidemia is controlled with diet & Atorvastatin. Patient denies myalgias or other med SE's. Last Lipids were at goal albeit elevated Trig's: Lab Results  Component Value Date   CHOL 149 06/29/2018   HDL 25 (L) 06/29/2018   LDLCALC 96 06/29/2018   TRIG 187 (H) 06/29/2018   CHOLHDL 6.0 (H) 06/29/2018       Also, the patient has Morbid Obesity (BMI 38+) and history of PreDiabetes and has had no symptoms of reactive hypoglycemia, diabetic polys, paresthesias or visual blurring.  Last A1c was not at goal:  Lab Results  Component Value Date   HGBA1C 5.8 (H) 06/29/2018      Further, the patient also has history of Vitamin D Deficiency and supplements vitamin D without any suspected side-effects. Last vitamin D was at goal: Lab Results  Component Value Date   VD25OH 83 03/22/2018   Current Outpatient Medications on File Prior to Visit  Medication Sig  . acetaminophen (TYLENOL) 500 MG tablet Take 500 mg by mouth every 6 (six) hours as needed for mild pain.   Marland Kitchen albuterol (PROVENTIL HFA;VENTOLIN HFA) 108 (90 Base) MCG/ACT inhaler Inhale 2 puffs into the lungs every 6 (six) hours as needed for wheezing or shortness of breath.  Marland Kitchen albuterol (PROVENTIL) (2.5 MG/3ML) 0.083% nebulizer solution Take 3 mLs (2.5 mg total) by  nebulization every 6 (six) hours as needed for wheezing or shortness of breath.  . allopurinol (ZYLOPRIM) 300 MG tablet Take 1 tablet (300 mg total) by mouth daily.  Marland Kitchen aspirin EC 81 MG tablet Take 81 mg by mouth daily.  Marland Kitchen atorvastatin (LIPITOR) 40 MG tablet Take 1 tablet (40 mg total) by mouth daily.  . Black Pepper-Turmeric (TURMERIC COMPLEX/BLACK PEPPER PO) Take 1,000 mg by mouth daily.   . Cholecalciferol (VITAMIN D PO) Take 15,000 Units by mouth daily.   . diphenhydrAMINE (BENADRYL) 25 MG tablet Take 25 mg by mouth at bedtime.  . docusate sodium (COLACE) 100 MG capsule Take 100 mg by mouth 2 (two) times daily. 1 tablet in the morning and 1 tablet at bedtime  . doxazosin (CARDURA) 4 MG tablet Take 1 tablet (4 mg total) by mouth daily.  . finasteride (PROSCAR) 5 MG tablet Take 1 tablet (5 mg total) by mouth daily.  . fluticasone (FLONASE) 50 MCG/ACT nasal spray Place 2 sprays into both nostrils at bedtime. PRN  . furosemide (LASIX) 40 MG tablet Take 1 tablet (40 mg total) by mouth daily.  . hydroxyurea (HYDREA) 500 MG capsule 1000 mg (2 caps) on Monday/wednesday and Friday and 500mg  (1cap) the rest of the days of the week .May take with food to minimize nausea.  Marland Kitchen levothyroxine (SYNTHROID, LEVOTHROID) 200 MCG tablet TAKE 1 TABLET EVERY DAY BEFORE BREAKFAST  EXCEPT Sunday TAKE 1.5 TABS.  Marland Kitchen losartan (COZAAR) 50 MG tablet TAKE 1 TABLET EVERY DAY  . Magnesium 400 MG TABS Take 1 tablet by mouth daily.  . metoprolol succinate (TOPROL-XL) 50 MG 24 hr tablet TAKE 1 TABLET EVERY DAY WITH FOOD  OR  IMMEDIATELY FOLLOWING A MEAL (Patient taking differently: 100 mg. )  . omeprazole (PRILOSEC) 20 MG capsule TAKE 1 CAPSULE TWICE DAILY  . OVER THE COUNTER MEDICATION daily. Vitamin A to Z 50 plus  . polyethylene glycol (MIRALAX / GLYCOLAX) packet Take 17 g by mouth daily.  Marland Kitchen spironolactone (ALDACTONE) 25 MG tablet TAKE 1 TABLET EVERY DAY  . warfarin (COUMADIN) 5 MG tablet TAKE 1 AND 1/2 TABLETS EVERY DAY  OR AS  DIRECTED   No current facility-administered medications on file prior to visit.    Allergies  Allergen Reactions  . Other Swelling    SODIUM SENSITIVE   PMHx:   Past Medical History:  Diagnosis Date  . A-fib ([place]) 01/2014  . AICD (automatic cardioverter/defibrillator) present    [employer]  . Arthritis   . ASHD (arteriosclerotic heart disease) 2015   s/p CABG  . Cataract    s/p left  . COPD (chronic obstructive pulmonary disease) ([place])    by CXR, pt unsure of this  . GERD (gastroesophageal reflux disease)   . Gout 2014  . Hyperlipidemia   . Hypertension   . Hypothyroidism   . LBBB (left bundle branch block)   . Pre-diabetes   . Prediabetes 01/2014  . Presence of permanent cardiac pacemaker   . Sleep apnea    no CPAP  . Thyroid disease    hypothyroid   Immunization History  Administered Date(s) Administered  . Influenza, High Dose Seasonal PF 04/23/2015, 02/18/2017, 03/29/2018  . Influenza,inj,Quad PF,6+ Mos 01/31/2016  . Pneumococcal Conjugate-13 11/21/2014  . Pneumococcal Polysaccharide-23 06/01/2017   Past Surgical History:  Procedure Laterality Date  . CARDIAC DEFIBRILLATOR PLACEMENT  07/2014  . CATARACT EXTRACTION W/ INTRAOCULAR LENS IMPLANT Left   . CORONARY ARTERY BYPASS GRAFT  11/2013  . lumb laminectomy  [phone]   x 3  . LUMBAR FUSION  2013  . NASAL SEPTOPLASTY W/ TURBINOPLASTY Bilateral 01/30/2016   Procedure: NASAL SEPTOPLASTY WITH BILATERAL TURBINATE REDUCTION;  Surgeon: Rozetta Nunnery, MD;  Location: [place] [facility];  Service: ENT;  Laterality: Bilateral;  . UVULECTOMY N/A 01/30/2016   Procedure: UVULECTOMY;  Surgeon: Rozetta Nunnery, MD;  Location: [facility] OR;  Service: ENT;  Laterality: N/A;   FHx:    Reviewed / unchanged  SHx:    Reviewed / unchanged   Systems Review:  Constitutional: Denies fever, chills, wt changes, headaches, insomnia, fatigue, night sweats, change in appetite. Eyes: Denies redness, blurred vision, diplopia,  discharge, itchy, watery eyes.  ENT: Denies discharge, congestion, post nasal drip, epistaxis, sore throat, earache, hearing loss, dental pain, tinnitus, vertigo, sinus pain, snoring.  CV: Denies chest pain, palpitations, irregular heartbeat, syncope, dyspnea, diaphoresis, orthopnea, PND, claudication or edema. Respiratory: denies cough, dyspnea, DOE, pleurisy, hoarseness, laryngitis, wheezing.  Gastrointestinal: Denies dysphagia, odynophagia, heartburn, reflux, water brash, abdominal pain or cramps, nausea, vomiting, bloating, diarrhea, constipation, hematemesis, melena, hematochezia  or hemorrhoids. Genitourinary: Denies dysuria, frequency, urgency, nocturia, hesitancy, discharge, hematuria or flank pain. Musculoskeletal: Denies arthralgias, myalgias, stiffness, jt. swelling, pain, limping or strain/sprain.  Skin: Denies pruritus, rash, hives, warts, acne, eczema or change in skin lesion(s). Neuro: No weakness, tremor, incoordination, spasms, paresthesia or pain. Psychiatric: Denies confusion, memory loss or sensory loss.  Endo: Denies change in weight, skin or hair change.  Heme/Lymph: No excessive bleeding, bruising or enlarged lymph nodes.  Physical Exam  BP 124/80   Pulse 76   Temp (!) 97.3 F (36.3 C)   Resp 18   Ht 5\' 9"  (1.753 m)   Wt 260 lb 9.6 oz (118.2 kg)   BMI 38.48 kg/m   Appears  over nourished, well groomed  and in no distress.  Eyes: PERRLA, EOMs, conjunctiva no swelling or erythema. Sinuses: No frontal/maxillary tenderness ENT/Mouth: EAC's clear, TM's nl w/o erythema, bulging. Nares clear w/o erythema, swelling, exudates. Oropharynx clear without erythema or exudates. Oral hygiene is good. Tongue normal, non obstructing. Hearing intact.  Neck: Supple. Thyroid not palpable. Car 2+/2+ without bruits, nodes or JVD. Chest: Respirations nl with BS clear & equal w/o rales, rhonchi, wheezing or stridor.  Cor: Heart sounds normal w/ regular rate and rhythm without sig.  murmurs, gallops, clicks or rubs. Peripheral pulses normal and equal  without edema.  Abdomen: Soft & bowel sounds normal. Non-tender w/o guarding, rebound, hernias, masses or organomegaly.  Lymphatics: Unremarkable.  Musculoskeletal: Full ROM all peripheral extremities, joint stability, 5/5 strength and normal gait.  Skin: Warm, dry without exposed rashes, lesions or ecchymosis apparent.  Neuro: Cranial nerves intact, reflexes equal bilaterally. Sensory-motor testing grossly intact. Tendon reflexes grossly intact.  Pysch: Alert & oriented x 3.  Insight and judgement nl & appropriate. No ideations.  Assessment and Plan:  1. Essential hypertension  - Continue medication, monitor blood pressure at home.  - Continue DASH diet.  Reminder to go to the ER if any CP,  SOB, nausea, dizziness, severe HA, changes vision/speech.  - CBC with Differential/Platelet - COMPLETE METABOLIC PANEL WITH GFR - Magnesium - TSH  2. Hyperlipidemia, mixed  - Continue diet/meds, exercise,& lifestyle modifications.  - Continue monitor periodic cholesterol/liver & renal functions   - Lipid panel - TSH  3. Abnormal glucose  - Continue diet, exercise  - Lifestyle modifications.  - Monitor appropriate labs.  - Hemoglobin A1c - Insulin, random  4. Vitamin D deficiency  - Continue supplementation.   - VITAMIN D 25 Hydroxyl  5. Prediabetes  - Hemoglobin A1c - Insulin, random  6. ASHD/CABG x 3V (11/2013)  - Lipid panel  7. Cardiac pacemaker/AICD (01/2014)  8. Paroxysmal atrial fibrillation (HCC)  - TSH  9. Medication management  - CBC with Differential/Platelet - COMPLETE METABOLIC PANEL WITH GFR - Magnesium - Lipid panel - TSH - Hemoglobin A1c - Insulin, random - VITAMIN D 25 Hydroxyl  10. Idiopathic gout  - Uric acid     Discussed  regular exercise, BP monitoring, weight control to achieve/maintain BMI less than 25 and discussed med and SE's. Recommended labs to assess and  monitor clinical status with further disposition pending results of labs. I discussed the assessment and treatment plan with the patient. The patient was provided an opportunity to ask questions and all were answered. The patient agreed with the plan and demonstrated an understanding of the instructions. I provided over 28 minutes of exam, counseling, chart review and  complex critical decision making was performed   Kirtland Bouchard, MD

## 2018-09-28 NOTE — Patient Instructions (Signed)

## 2018-09-29 ENCOUNTER — Other Ambulatory Visit: Payer: Self-pay

## 2018-09-29 ENCOUNTER — Ambulatory Visit (INDEPENDENT_AMBULATORY_CARE_PROVIDER_SITE_OTHER): Payer: Medicare Other | Admitting: Internal Medicine

## 2018-09-29 VITALS — BP 124/80 | HR 76 | Temp 97.3°F | Resp 18 | Ht 69.0 in | Wt 260.6 lb

## 2018-09-29 DIAGNOSIS — I2510 Atherosclerotic heart disease of native coronary artery without angina pectoris: Secondary | ICD-10-CM | POA: Diagnosis not present

## 2018-09-29 DIAGNOSIS — I480 Paroxysmal atrial fibrillation: Secondary | ICD-10-CM | POA: Diagnosis not present

## 2018-09-29 DIAGNOSIS — R7309 Other abnormal glucose: Secondary | ICD-10-CM | POA: Diagnosis not present

## 2018-09-29 DIAGNOSIS — Z950 Presence of cardiac pacemaker: Secondary | ICD-10-CM

## 2018-09-29 DIAGNOSIS — M1000 Idiopathic gout, unspecified site: Secondary | ICD-10-CM | POA: Diagnosis not present

## 2018-09-29 DIAGNOSIS — Z79899 Other long term (current) drug therapy: Secondary | ICD-10-CM | POA: Diagnosis not present

## 2018-09-29 DIAGNOSIS — E782 Mixed hyperlipidemia: Secondary | ICD-10-CM

## 2018-09-29 DIAGNOSIS — R7303 Prediabetes: Secondary | ICD-10-CM | POA: Diagnosis not present

## 2018-09-29 DIAGNOSIS — I10 Essential (primary) hypertension: Secondary | ICD-10-CM

## 2018-09-29 DIAGNOSIS — E559 Vitamin D deficiency, unspecified: Secondary | ICD-10-CM | POA: Diagnosis not present

## 2018-09-30 LAB — CBC WITH DIFFERENTIAL/PLATELET
Absolute Monocytes: 679 cells/uL (ref 200–950)
Basophils Absolute: 209 cells/uL — ABNORMAL HIGH (ref 0–200)
Basophils Relative: 1.2 %
Eosinophils Absolute: 174 cells/uL (ref 15–500)
Eosinophils Relative: 1 %
HCT: 51.6 % — ABNORMAL HIGH (ref 38.5–50.0)
Hemoglobin: 17.2 g/dL — ABNORMAL HIGH (ref 13.2–17.1)
Lymphs Abs: 1775 cells/uL (ref 850–3900)
MCH: 30.2 pg (ref 27.0–33.0)
MCHC: 33.3 g/dL (ref 32.0–36.0)
MCV: 90.5 fL (ref 80.0–100.0)
MPV: 11.2 fL (ref 7.5–12.5)
Monocytes Relative: 3.9 %
Neutro Abs: 14564 cells/uL — ABNORMAL HIGH (ref 1500–7800)
Neutrophils Relative %: 83.7 %
Platelets: 460 10*3/uL — ABNORMAL HIGH (ref 140–400)
RBC: 5.7 10*6/uL (ref 4.20–5.80)
RDW: 17.4 % — ABNORMAL HIGH (ref 11.0–15.0)
Total Lymphocyte: 10.2 %
WBC: 17.4 10*3/uL — ABNORMAL HIGH (ref 3.8–10.8)

## 2018-09-30 LAB — COMPLETE METABOLIC PANEL WITH GFR
AG Ratio: 2 (calc) (ref 1.0–2.5)
ALT: 21 U/L (ref 9–46)
AST: 23 U/L (ref 10–35)
Albumin: 4.3 g/dL (ref 3.6–5.1)
Alkaline phosphatase (APISO): 75 U/L (ref 35–144)
BUN: 18 mg/dL (ref 7–25)
CO2: 30 mmol/L (ref 20–32)
Calcium: 9.6 mg/dL (ref 8.6–10.3)
Chloride: 95 mmol/L — ABNORMAL LOW (ref 98–110)
Creat: 0.94 mg/dL (ref 0.70–1.18)
GFR, Est African American: 90 mL/min/{1.73_m2} (ref >=60)
GFR, Est Non African American: 77 mL/min/{1.73_m2} (ref >=60)
Globulin: 2.2 g/dL (calc) (ref 1.9–3.7)
Glucose, Bld: 97 mg/dL (ref 65–99)
Potassium: 5.5 mmol/L — ABNORMAL HIGH (ref 3.5–5.3)
Sodium: 132 mmol/L — ABNORMAL LOW (ref 135–146)
Total Bilirubin: 1.3 mg/dL — ABNORMAL HIGH (ref 0.2–1.2)
Total Protein: 6.5 g/dL (ref 6.1–8.1)

## 2018-09-30 LAB — LIPID PANEL
Cholesterol: 147 mg/dL (ref <200)
HDL: 20 mg/dL — ABNORMAL LOW (ref >=40)
LDL Cholesterol (Calc): 97 mg/dL (calc)
Non-HDL Cholesterol (Calc): 127 mg/dL (calc) (ref <130)
Total CHOL/HDL Ratio: 7.4 (calc) — ABNORMAL HIGH (ref <5.0)
Triglycerides: 201 mg/dL — ABNORMAL HIGH (ref <150)

## 2018-09-30 LAB — URIC ACID: Uric Acid, Serum: 5.8 mg/dL (ref 4.0–8.0)

## 2018-09-30 LAB — VITAMIN D 25 HYDROXY (VIT D DEFICIENCY, FRACTURES): Vit D, 25-Hydroxy: 70 ng/mL (ref 30–100)

## 2018-09-30 LAB — MAGNESIUM: Magnesium: 1.8 mg/dL (ref 1.5–2.5)

## 2018-09-30 LAB — HEMOGLOBIN A1C
Hgb A1c MFr Bld: 5.8 % of total Hgb — ABNORMAL HIGH (ref <5.7)
Mean Plasma Glucose: 120 (calc)
eAG (mmol/L): 6.6 (calc)

## 2018-09-30 LAB — INSULIN, RANDOM: Insulin: 7.7 u[IU]/mL

## 2018-09-30 LAB — TSH: TSH: 3.24 mIU/L (ref 0.40–4.50)

## 2018-10-07 ENCOUNTER — Telehealth: Payer: Self-pay | Admitting: *Deleted

## 2018-10-07 NOTE — Telephone Encounter (Signed)
Patient called and reported he is having gas/belching almost all the time. Per Dr Melford Aase, the patient was advised to try a gluten free diet. He was advised he can download an app called "GF Scanner" to identify foods that contain gluten. The patient is aware and will try the diet.  He will call back, if gas continues and possibly get a celiac panel drawn.

## 2018-10-18 DIAGNOSIS — Z95810 Presence of automatic (implantable) cardiac defibrillator: Secondary | ICD-10-CM | POA: Diagnosis not present

## 2018-10-18 DIAGNOSIS — I480 Paroxysmal atrial fibrillation: Secondary | ICD-10-CM | POA: Diagnosis not present

## 2018-10-18 DIAGNOSIS — I4891 Unspecified atrial fibrillation: Secondary | ICD-10-CM | POA: Diagnosis not present

## 2018-10-18 DIAGNOSIS — I255 Ischemic cardiomyopathy: Secondary | ICD-10-CM | POA: Diagnosis not present

## 2018-10-18 DIAGNOSIS — I2510 Atherosclerotic heart disease of native coronary artery without angina pectoris: Secondary | ICD-10-CM | POA: Diagnosis not present

## 2018-10-18 DIAGNOSIS — I429 Cardiomyopathy, unspecified: Secondary | ICD-10-CM | POA: Diagnosis not present

## 2018-10-20 DIAGNOSIS — I255 Ischemic cardiomyopathy: Secondary | ICD-10-CM | POA: Diagnosis not present

## 2018-10-20 NOTE — Progress Notes (Signed)
HEMATOLOGY/ONCOLOGY CLINIC NOTE  Date of Service: 10/21/2018  Patient Care Team: [name], MD as PCP - General (Internal Medicine) Georg Ruddle, Ashok Cordia, MD as Referring Physician (Cardiology) Shirlee More Ronalee Red, MD as Referring Physician (Cardiology) Brunetta Genera, MD as Consulting Physician (Hematology)  CHIEF COMPLAINTS/PURPOSE OF CONSULTATION:  F/u for MPN  HISTORY OF PRESENTING ILLNESS:   [name] [name] is a wonderful 78 y.o. male who has been referred to [place] by Dr. [name]  for evaluation and management of Thrombocytosis. He is accompanied today by his wife. The pt reports that he is doing well overall.   The pt reports that he has never had any blood clots.  He has atrial fibrillation and has taken Coumadin since 2015. He had heart surgery in 2015, and had his ICD placed in 2016. The pt also takes 81g aspirin daily.   The pt notes that he has not been aware of his high platelets previously, but only as of his most recent labs. He denies any recent major bleeds, surgeries, and other trauma. He denies having a splenectomy at any time as well.   The pt notes that his right kidney gets sore from time to time when he doesn't stay well hydrated. He also notes that when he urinates at these times, his urine is very dark. The pt adds that he had kidney stones one time. The pt is also taking Lasix and notes that he urinates frequently.   The pt adds that his gums have been very sore, even to touch from his tongue.  The pt notes that he does not want to use a CPAP after a bad experience with a previous sleep study that did diagnose him with sleep apnea. He notes that he falls asleep several times a day as well, falling asleep easily when he sits down, and denying falling asleep while driving. He also notes some increased difficulty remembering.   Most recent lab results (12/09/17) of CBC is as follows: all values are WNL except for WBC at 11.9k, RDW at 18.4, PLT at  649k, ANC at 9.7k.  On review of systems, pt reports good energy levels, falling asleep several times a day, staying active, and denies recent surgery, recent trauma, bleeding, unexpected weight loss, pain along the spine, abdominal pains, and any other symptoms.   On Family Hx the pt reports prostate cancer.   Interval History:   [name] [name] returns today for management and evaluation of his newly diagnosed Essential thrombocytosis. The patient's last visit with [place] was on 07/15/18. The pt reports that he is doing well overall.  The pt reports that he increased on his metoprolol to 100mg  in the interim. He also notes that he has continued on Hydroxyurea without any difficulty or complication. He denies skin rashes, throat sores, mouth sores, or diarrhea. He notes that he is eating well and is staying somewhat active with walking on his treadmill at home each morning.  Lab results today (10/21/18) of CBC w/diff and CMP is as follows: all values are WNL except for WBC at 15.2k, HCT at 53.1%, RDW at 18.0, PLT at 479k, ANC at 12.2k, Basophils abs at 200, Glucose at 125.  On review of systems, pt reports good energy levels, eating well, staying active, and denies skin rashes, mouth sores, throat sores, diarrhea, abdominal pains, and any other symptoms.   MEDICAL HISTORY:  Past Medical History:  Diagnosis Date  . A-fib ([place]) 01/2014  . AICD (automatic cardioverter/defibrillator) present  [employer]  . Arthritis   . ASHD (arteriosclerotic heart disease) 2015   s/p CABG  . Cataract    s/p left  . COPD (chronic obstructive pulmonary disease) ([place])    by CXR, pt unsure of this  . GERD (gastroesophageal reflux disease)   . Gout 2014  . Hyperlipidemia   . Hypertension   . Hypothyroidism   . LBBB (left bundle branch block)   . Pre-diabetes   . Prediabetes 01/2014  . Presence of permanent cardiac pacemaker   . Sleep apnea    no CPAP  . Thyroid disease    hypothyroid     SURGICAL HISTORY: Past Surgical History:  Procedure Laterality Date  . CARDIAC DEFIBRILLATOR PLACEMENT  07/2014  . CATARACT EXTRACTION W/ INTRAOCULAR LENS IMPLANT Left   . CORONARY ARTERY BYPASS GRAFT  11/2013  . lumb laminectomy  [phone]   x 3  . LUMBAR FUSION  2013  . NASAL SEPTOPLASTY W/ TURBINOPLASTY Bilateral 01/30/2016   Procedure: NASAL SEPTOPLASTY WITH BILATERAL TURBINATE REDUCTION;  Surgeon: Rozetta Nunnery, MD;  Location: [facility];  Service: ENT;  Laterality: Bilateral;  . UVULECTOMY N/A 01/30/2016   Procedure: UVULECTOMY;  Surgeon: Rozetta Nunnery, MD;  Location: [facility] OR;  Service: ENT;  Laterality: N/A;    SOCIAL HISTORY: Social History   Socioeconomic History  . Marital status: Married    Spouse name: [name]   . Number of children: Not on file  . Years of education: 33  . Highest education level: Not on file  Occupational History  . Occupation: Retired  Scientific laboratory technician  . Financial resource strain: Not on file  . Food insecurity:    Worry: Not on file    Inability: Not on file  . Transportation needs:    Medical: Not on file    Non-medical: Not on file  Tobacco Use  . Smoking status: Former Smoker    Packs/day: 2.00    Years: 25.00    Pack years: 50.00    Types: Cigarettes    Last attempt to quit: 05/20/1983    Years since quitting: 35.4  . Smokeless tobacco: Never Used  Substance and Sexual Activity  . Alcohol use: Not Currently    Alcohol/week: 0.0 standard drinks  . Drug use: No  . Sexual activity: Not on file  Lifestyle  . Physical activity:    Days per week: Not on file    Minutes per session: Not on file  . Stress: Not on file  Relationships  . Social connections:    Talks on phone: Not on file    Gets together: Not on file    Attends religious service: Not on file    Active member of club or organization: Not on file    Attends meetings of clubs or organizations: Not on file    Relationship status: Not on file  . Intimate partner  violence:    Fear of current or ex partner: Not on file    Emotionally abused: Not on file    Physically abused: Not on file    Forced sexual activity: Not on file  Other Topics Concern  . Not on file  Social History Narrative   Lives with wife, [name]   Caffeine use: daily (Coffee)    FAMILY HISTORY: Family History  Problem Relation Age of Onset  . Alzheimer's disease Mother   . Mental illness Mother   . Depression Mother   . Heart disease Father 34  had 5 MI's & died 52 yo  . Alcohol abuse Son   . Stroke Maternal Grandmother     ALLERGIES:  is allergic to other.  MEDICATIONS:  Current Outpatient Medications  Medication Sig Dispense Refill  . acetaminophen (TYLENOL) 500 MG tablet Take 500 mg by mouth every 6 (six) hours as needed for mild pain.     Marland Kitchen albuterol (PROVENTIL HFA;VENTOLIN HFA) 108 (90 Base) MCG/ACT inhaler Inhale 2 puffs into the lungs every 6 (six) hours as needed for wheezing or shortness of breath. 1 Inhaler 3  . albuterol (PROVENTIL) (2.5 MG/3ML) 0.083% nebulizer solution Take 3 mLs (2.5 mg total) by nebulization every 6 (six) hours as needed for wheezing or shortness of breath. 75 mL 12  . allopurinol (ZYLOPRIM) 300 MG tablet Take 1 tablet (300 mg total) by mouth daily. 90 tablet 3  . aspirin EC 81 MG tablet Take 81 mg by mouth daily.    Marland Kitchen atorvastatin (LIPITOR) 40 MG tablet Take 1 tablet (40 mg total) by mouth daily. 90 tablet 3  . Black Pepper-Turmeric (TURMERIC COMPLEX/BLACK PEPPER PO) Take 1,000 mg by mouth daily.     . Cholecalciferol (VITAMIN D PO) Take 15,000 Units by mouth daily.     . diphenhydrAMINE (BENADRYL) 25 MG tablet Take 25 mg by mouth at bedtime.    . docusate sodium (COLACE) 100 MG capsule Take 100 mg by mouth 2 (two) times daily. 1 tablet in the morning and 1 tablet at bedtime    . doxazosin (CARDURA) 4 MG tablet Take 1 tablet (4 mg total) by mouth daily. 90 tablet 3  . finasteride (PROSCAR) 5 MG tablet Take 1 tablet (5 mg total) by  mouth daily. 90 tablet 3  . fluticasone (FLONASE) 50 MCG/ACT nasal spray Place 2 sprays into both nostrils at bedtime. PRN 16 g 2  . furosemide (LASIX) 40 MG tablet Take 1 tablet (40 mg total) by mouth daily. 90 tablet 3  . hydroxyurea (HYDREA) 500 MG capsule 1000 mg (2 caps) on Monday, Tuesday,wednesday and thursday and 500mg  (1cap) the rest of the days of the week .May take with food to minimize nausea. 120 capsule 2  . levothyroxine (SYNTHROID, LEVOTHROID) 200 MCG tablet TAKE 1 TABLET EVERY DAY BEFORE BREAKFAST EXCEPT Sunday TAKE 1.5 TABS. 96 tablet 3  . losartan (COZAAR) 50 MG tablet TAKE 1 TABLET EVERY DAY 90 tablet 3  . Magnesium 400 MG TABS Take 1 tablet by mouth daily.    . metoprolol succinate (TOPROL-XL) 50 MG 24 hr tablet TAKE 1 TABLET EVERY DAY WITH FOOD  OR  IMMEDIATELY FOLLOWING A MEAL (Patient taking differently: 100 mg. ) 90 tablet 3  . omeprazole (PRILOSEC) 20 MG capsule TAKE 1 CAPSULE TWICE DAILY 180 capsule 1  . OVER THE COUNTER MEDICATION daily. Vitamin A to Z 50 plus    . polyethylene glycol (MIRALAX / GLYCOLAX) packet Take 17 g by mouth daily.    Marland Kitchen spironolactone (ALDACTONE) 25 MG tablet TAKE 1 TABLET EVERY DAY 90 tablet 0  . warfarin (COUMADIN) 5 MG tablet TAKE 1 AND 1/2 TABLETS EVERY DAY  OR AS DIRECTED 145 tablet 1   No current facility-administered medications for this visit.     REVIEW OF SYSTEMS:    A 10+ POINT REVIEW OF SYSTEMS WAS OBTAINED including neurology, dermatology, psychiatry, cardiac, respiratory, lymph, extremities, GI, GU, Musculoskeletal, constitutional, breasts, reproductive, HEENT.  All pertinent positives are noted in the HPI.  All others are negative.   PHYSICAL EXAMINATION: .  Vitals:   10/21/18 1444  BP: 130/65  Pulse: 71  Resp: 17  Temp: 98 F (36.7 C)  SpO2: 97%   Filed Weights   10/21/18 1444  Weight: 261 lb 6.4 oz (118.6 kg)   .Body mass index is 38.6 kg/m.  GENERAL:alert, in no acute distress and comfortable SKIN: no acute  rashes, no significant lesions EYES: conjunctiva are pink and non-injected, sclera anicteric OROPHARYNX: MMM, no exudates, no oropharyngeal erythema or ulceration NECK: supple, no JVD LYMPH:  no palpable lymphadenopathy in the cervical, axillary or inguinal regions LUNGS: clear to auscultation b/l with normal respiratory effort HEART: regular rate & rhythm ABDOMEN:  normoactive bowel sounds , non tender, not distended. No palpable hepatosplenomegaly.  Extremity: 1+ pedal edema PSYCH: alert & oriented x 3 with fluent speech NEURO: no focal motor/sensory deficits   LABORATORY DATA:  I have reviewed the data as listed  . CBC Latest Ref Rng & Units 10/21/2018 09/29/2018 07/15/2018  WBC 4.0 - 10.5 K/uL 15.2(H) 17.4(H) 14.6(H)  Hemoglobin 13.0 - 17.0 g/dL 16.7 17.2(H) 16.3  Hematocrit 39.0 - 52.0 % 53.1(H) 51.6(H) 51.9  Platelets 150 - 400 K/uL 479(H) 460(H) 368    . CMP Latest Ref Rng & Units 10/21/2018 09/29/2018 07/15/2018  Glucose 70 - 99 mg/dL 125(H) 97 107(H)  BUN 8 - 23 mg/dL 13 18 19   Creatinine 0.61 - 1.24 mg/dL 1.06 0.94 1.13  Sodium 135 - 145 mmol/L 136 132(L) 137  Potassium 3.5 - 5.1 mmol/L 4.4 5.5(H) 4.7  Chloride 98 - 111 mmol/L 100 95(L) 98  CO2 22 - 32 mmol/L 28 30 31   Calcium 8.9 - 10.3 mg/dL 9.5 9.6 9.5  Total Protein 6.5 - 8.1 g/dL 6.8 6.5 6.8  Total Bilirubin 0.3 - 1.2 mg/dL 1.1 1.3(H) 1.4(H)  Alkaline Phos 38 - 126 U/L 74 - 66  AST 15 - 41 U/L 36 23 32  ALT 0 - 44 U/L 27 21 23    . Lab Results  Component Value Date   LDH 511 (H) 10/21/2018    02/09/18 JAK2 Mutation Study:    02/09/18 BCR ABL FISH:    RADIOGRAPHIC STUDIES: I have personally reviewed the radiological images as listed and agreed with the findings in the report. No results found.  ASSESSMENT & PLAN:   78 y.o. male with  1. 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 Some associated leukocytosis. No history of splenectomy. No previous h/o VTE, but significant cardiac risk factors. 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   02/09/18 BCR ABL FISH revealed no evidence of BCR/ABL rearrangement   PLAN: -Discussed pt labwork today, 10/21/18; PLT higher at 479k. HCT increased to 53.1% and HGB at 16.7, WBC decreased to 15.2k -The pt has no prohibitive toxicities from increasing to 1000mg  Hydroxyurea four days a week, and then 500mg  3 days a week, at this time. -Pt will proceed with a therapeutic phlebotomy today. Recommend pt stay well hydrated also. -Will tentatively schedule second therapeutic phlebotomy in two months -Avoid iron supplements -Goal for PLT between 200k and 400k -Recommend pt decrease laxatives given 4 bowel movements a day -Recommended sun protection strategies as Hydroxyurea can increase sensitivity of skin, and that he should  watch for mouth sores as well  -Will see the pt back in 2 months  2. . Patient Active Problem List   Diagnosis Date Noted  . Polycythemia vera ([place]) 10/21/2018  . Thrombocytosis ([place]) 12/29/2017  . Senile purpura ([place]) 09/01/2017  . Emphysema of lung ([place]) 06/01/2017  . Tortuous aorta ([facility]) 06/01/2017  . ASHD/CABG x 3V (11/2013) 02/06/2015  . Encounter for monitoring Coumadin therapy 02/06/2015  . Cardiac pacemaker/AICD (01/2014) 02/06/2015  . Morbid obesity ([place]) 01/03/2015  . HTN (hypertension) 01/02/2015  . Mixed hyperlipidemia 01/02/2015  . Prediabetes 01/02/2015  . Vitamin D deficiency 01/02/2015  . GERD  01/02/2015  . BPH (benign prostatic hyperplasia) 01/02/2015  . Medication management 01/02/2015  . Atrial fibrillation ([place]) 01/02/2015  . Hypothyroidism 01/02/2015  . OSA  (obstructive sleep apnea) 01/02/2015  -continue f/u with PCP to optimize atherosclerotic risk factors.   No orders of the defined types were placed in this encounter.   RTC with Dr Irene Limbo with labs and therapeutic phlebotomy appointment in 2 months   All of the patients questions were answered with apparent satisfaction. The patient knows to call the clinic with any problems, questions or concerns.  The total time spent in the appt was 25 minutes and more than 50% was on counseling and direct patient cares.    Sullivan Lone MD MS AAHIVMS [facility] Hematology/Oncology Physician [facility]  (Office):       [phone] (Work cell):  [phone] (Fax):           [phone]  10/21/2018 3:26 PM  I, Baldwin [place], am acting as a scribe for Dr. Sullivan Lone.   .I have reviewed the above documentation for accuracy and completeness, and I agree with the above. Brunetta Genera MD

## 2018-10-21 ENCOUNTER — Inpatient Hospital Stay: Payer: Medicare Other | Attending: Hematology

## 2018-10-21 ENCOUNTER — Other Ambulatory Visit: Payer: Self-pay

## 2018-10-21 ENCOUNTER — Inpatient Hospital Stay (HOSPITAL_BASED_OUTPATIENT_CLINIC_OR_DEPARTMENT_OTHER): Payer: Medicare Other | Admitting: Hematology

## 2018-10-21 ENCOUNTER — Inpatient Hospital Stay: Payer: Medicare Other

## 2018-10-21 VITALS — BP 130/65 | HR 71 | Temp 98.0°F | Resp 17 | Ht 69.0 in | Wt 261.4 lb

## 2018-10-21 VITALS — BP 123/77 | HR 83 | Resp 20

## 2018-10-21 DIAGNOSIS — I10 Essential (primary) hypertension: Secondary | ICD-10-CM | POA: Diagnosis not present

## 2018-10-21 DIAGNOSIS — I4891 Unspecified atrial fibrillation: Secondary | ICD-10-CM | POA: Diagnosis not present

## 2018-10-21 DIAGNOSIS — Z79899 Other long term (current) drug therapy: Secondary | ICD-10-CM | POA: Diagnosis not present

## 2018-10-21 DIAGNOSIS — Z7901 Long term (current) use of anticoagulants: Secondary | ICD-10-CM | POA: Insufficient documentation

## 2018-10-21 DIAGNOSIS — D473 Essential (hemorrhagic) thrombocythemia: Secondary | ICD-10-CM | POA: Diagnosis not present

## 2018-10-21 DIAGNOSIS — E039 Hypothyroidism, unspecified: Secondary | ICD-10-CM

## 2018-10-21 DIAGNOSIS — D45 Polycythemia vera: Secondary | ICD-10-CM

## 2018-10-21 DIAGNOSIS — Z7982 Long term (current) use of aspirin: Secondary | ICD-10-CM | POA: Diagnosis not present

## 2018-10-21 DIAGNOSIS — Z1589 Genetic susceptibility to other disease: Secondary | ICD-10-CM

## 2018-10-21 DIAGNOSIS — J449 Chronic obstructive pulmonary disease, unspecified: Secondary | ICD-10-CM | POA: Insufficient documentation

## 2018-10-21 DIAGNOSIS — Z87891 Personal history of nicotine dependence: Secondary | ICD-10-CM

## 2018-10-21 DIAGNOSIS — D471 Chronic myeloproliferative disease: Secondary | ICD-10-CM

## 2018-10-21 LAB — CBC WITH DIFFERENTIAL/PLATELET
Basophils Absolute: 0.2 10*3/uL — ABNORMAL HIGH (ref 0.0–0.1)
Basophils Relative: 1 %
Eosinophils Absolute: 0.1 10*3/uL (ref 0.0–0.5)
Eosinophils Relative: 1 %
HCT: 53.1 % — ABNORMAL HIGH (ref 39.0–52.0)
Hemoglobin: 16.7 g/dL (ref 13.0–17.0)
Lymphocytes Relative: 11 %
Lymphs Abs: 1.2 10*3/uL (ref 0.7–4.0)
MCH: 30.8 pg (ref 26.0–34.0)
MCHC: 31.5 g/dL (ref 30.0–36.0)
MCV: 97.8 fL (ref 80.0–100.0)
Monocytes Absolute: 0.6 10*3/uL (ref 0.1–1.0)
Monocytes Relative: 4 %
Neutro Abs: 12.2 10*3/uL — ABNORMAL HIGH (ref 1.7–7.7)
Neutrophils Relative %: 81 %
Platelets: 479 10*3/uL — ABNORMAL HIGH (ref 150–400)
RBC: 5.43 MIL/uL (ref 4.22–5.81)
RDW: 18 % — ABNORMAL HIGH (ref 11.5–15.5)
WBC: 15.2 10*3/uL — ABNORMAL HIGH (ref 4.0–10.5)
nRBC: 0.1 % (ref 0.0–0.2)

## 2018-10-21 LAB — CMP (CANCER CENTER ONLY)
ALT: 27 U/L (ref 0–44)
AST: 36 U/L (ref 15–41)
Albumin: 4.2 g/dL (ref 3.5–5.0)
Alkaline Phosphatase: 74 U/L (ref 38–126)
Anion gap: 8 (ref 5–15)
BUN: 13 mg/dL (ref 8–23)
CO2: 28 mmol/L (ref 22–32)
Calcium: 9.5 mg/dL (ref 8.9–10.3)
Chloride: 100 mmol/L (ref 98–111)
Creatinine: 1.06 mg/dL (ref 0.61–1.24)
GFR, Est AFR Am: >60 mL/min (ref >60)
GFR, Estimated: >60 mL/min (ref >60)
Glucose, Bld: 125 mg/dL — ABNORMAL HIGH (ref 70–99)
Potassium: 4.4 mmol/L (ref 3.5–5.1)
Sodium: 136 mmol/L (ref 135–145)
Total Bilirubin: 1.1 mg/dL (ref 0.3–1.2)
Total Protein: 6.8 g/dL (ref 6.5–8.1)

## 2018-10-21 LAB — LACTATE DEHYDROGENASE: LDH: 511 U/L — ABNORMAL HIGH (ref 98–192)

## 2018-10-21 MED ORDER — HYDROXYUREA 500 MG PO CAPS: ORAL_CAPSULE | ORAL | 2 refills | Status: DC

## 2018-10-21 NOTE — Progress Notes (Signed)
[name] [name] presents today for phlebotomy per MD orders. Phlebotomy procedure started at 1540 and ended at 1348 with 520 grams removed via 16G to RAC - attempt #2. Patient observed for 30 minutes after procedure without any incident. Patient tolerated procedure well. Diet and nutrition offered.

## 2018-10-21 NOTE — Progress Notes (Signed)
Pt [place] home, VS stable.

## 2018-10-21 NOTE — Patient Instructions (Signed)

## 2018-10-22 ENCOUNTER — Telehealth: Payer: Self-pay | Admitting: Hematology

## 2018-10-22 NOTE — Telephone Encounter (Signed)
Scheduled appt per 6/4 los. ° °A calendar will be mailed out. °

## 2018-10-25 ENCOUNTER — Telehealth: Payer: Self-pay | Admitting: *Deleted

## 2018-10-25 ENCOUNTER — Other Ambulatory Visit: Payer: Self-pay | Admitting: Hematology

## 2018-10-25 MED ORDER — HYDROXYUREA 500 MG PO CAPS: ORAL_CAPSULE | ORAL | 2 refills | Status: DC

## 2018-10-25 NOTE — Telephone Encounter (Signed)
Patient called to ask if he could do anything to impact his HCT so that it will stay lower. Contacted him with information from Dr. Irene Limbo - heart healthy diet, appropriate amount of fluids (taking cardiac status into account) and moderate walking 20-30 minutes a day. Patientverbalized understanding of information and states he is walking on treadmill 5-7 minutes at a time, 2-4 days a week, trying to build up stamina.

## 2018-11-02 DIAGNOSIS — I480 Paroxysmal atrial fibrillation: Secondary | ICD-10-CM | POA: Diagnosis not present

## 2018-11-02 DIAGNOSIS — I255 Ischemic cardiomyopathy: Secondary | ICD-10-CM | POA: Diagnosis not present

## 2018-11-02 DIAGNOSIS — Z95810 Presence of automatic (implantable) cardiac defibrillator: Secondary | ICD-10-CM | POA: Diagnosis not present

## 2018-11-02 DIAGNOSIS — Z7901 Long term (current) use of anticoagulants: Secondary | ICD-10-CM | POA: Diagnosis not present

## 2018-11-11 DIAGNOSIS — H26493 Other secondary cataract, bilateral: Secondary | ICD-10-CM | POA: Diagnosis not present

## 2018-11-11 DIAGNOSIS — Z961 Presence of intraocular lens: Secondary | ICD-10-CM | POA: Diagnosis not present

## 2018-11-11 DIAGNOSIS — H16223 Keratoconjunctivitis sicca, not specified as Sjogren's, bilateral: Secondary | ICD-10-CM | POA: Diagnosis not present

## 2018-11-22 ENCOUNTER — Other Ambulatory Visit: Payer: Self-pay | Admitting: Internal Medicine

## 2018-12-08 DIAGNOSIS — H26493 Other secondary cataract, bilateral: Secondary | ICD-10-CM | POA: Diagnosis not present

## 2018-12-14 DIAGNOSIS — Z95810 Presence of automatic (implantable) cardiac defibrillator: Secondary | ICD-10-CM | POA: Diagnosis not present

## 2018-12-14 DIAGNOSIS — I480 Paroxysmal atrial fibrillation: Secondary | ICD-10-CM | POA: Diagnosis not present

## 2018-12-14 DIAGNOSIS — I255 Ischemic cardiomyopathy: Secondary | ICD-10-CM | POA: Diagnosis not present

## 2018-12-14 DIAGNOSIS — Z7901 Long term (current) use of anticoagulants: Secondary | ICD-10-CM | POA: Diagnosis not present

## 2018-12-20 NOTE — Progress Notes (Signed)
HEMATOLOGY/ONCOLOGY CLINIC NOTE  Date of Service: 12/21/2018  Patient Care Team: [name], MD as PCP - General (Internal Medicine) Georg Ruddle, Ashok Cordia, MD as Referring Physician (Cardiology) Shirlee More Ronalee Red, MD as Referring Physician (Cardiology) Brunetta Genera, MD as Consulting Physician (Hematology)  CHIEF COMPLAINTS/PURPOSE OF CONSULTATION:  F/u for MPN  HISTORY OF PRESENTING ILLNESS:   [name] [name] is a wonderful 78 y.o. male who has been referred to [place] by Dr. [name]  for evaluation and management of Thrombocytosis. He is accompanied today by his wife. The pt reports that he is doing well overall.   The pt reports that he has never had any blood clots.  He has atrial fibrillation and has taken Coumadin since 2015. He had heart surgery in 2015, and had his ICD placed in 2016. The pt also takes 81g aspirin daily.   The pt notes that he has not been aware of his high platelets previously, but only as of his most recent labs. He denies any recent major bleeds, surgeries, and other trauma. He denies having a splenectomy at any time as well.   The pt notes that his right kidney gets sore from time to time when he doesn't stay well hydrated. He also notes that when he urinates at these times, his urine is very dark. The pt adds that he had kidney stones one time. The pt is also taking Lasix and notes that he urinates frequently.   The pt adds that his gums have been very sore, even to touch from his tongue.  The pt notes that he does not want to use a CPAP after a bad experience with a previous sleep study that did diagnose him with sleep apnea. He notes that he falls asleep several times a day as well, falling asleep easily when he sits down, and denying falling asleep while driving. He also notes some increased difficulty remembering.   Most recent lab results (12/09/17) of CBC is as follows: all values are WNL except for WBC at 11.9k, RDW at 18.4, PLT at  649k, ANC at 9.7k.  On review of systems, pt reports good energy levels, falling asleep several times a day, staying active, and denies recent surgery, recent trauma, bleeding, unexpected weight loss, pain along the spine, abdominal pains, and any other symptoms.   On Family Hx the pt reports prostate cancer.   Interval History:   [name] [name] returns today for management and evaluation of his newly diagnosed JAK2 positive MPN. The patient's last visit with [place] was on 10/21/2018. The pt reports that he is doing well overall.  The pt reports that he experiences gas and stomachaches every morning. He takes his hydroxyurea soon after dinner. HE reports that he was not walking regularly for a long time, but has gotten back into it this week. He reports that he has noticed a slight decrease in his INR.  Lab results today (12/21/2018) of CBC w/diff and CMP is as follows: all values are WNL except for WBC at 16.7k, HCT 52.9, RDW at 16.6, PLT at 416k, Neutro abs at 13.8, basophils abs at 0.2k, abs immature granulocytes at 0.22k, glucose bld at 106. 12/21/2018 LDH at 442  On review of systems, pt reports gas, staying hydrated, stomachaches, and denies fevers, chills, infection concerns, and any other symptoms.   MEDICAL HISTORY:  Past Medical History:  Diagnosis Date  . A-fib ([place]) 01/2014  . AICD (automatic cardioverter/defibrillator) present    [employer]  .  Arthritis   . ASHD (arteriosclerotic heart disease) 2015   s/p CABG  . Cataract    s/p left  . COPD (chronic obstructive pulmonary disease) ([place])    by CXR, pt unsure of this  . GERD (gastroesophageal reflux disease)   . Gout 2014  . Hyperlipidemia   . Hypertension   . Hypothyroidism   . LBBB (left bundle branch block)   . Pre-diabetes   . Prediabetes 01/2014  . Presence of permanent cardiac pacemaker   . Sleep apnea    no CPAP  . Thyroid disease    hypothyroid    SURGICAL HISTORY: Past Surgical History:  Procedure  Laterality Date  . CARDIAC DEFIBRILLATOR PLACEMENT  07/2014  . CATARACT EXTRACTION W/ INTRAOCULAR LENS IMPLANT Left   . CORONARY ARTERY BYPASS GRAFT  11/2013  . lumb laminectomy  [phone]   x 3  . LUMBAR FUSION  2013  . NASAL SEPTOPLASTY W/ TURBINOPLASTY Bilateral 01/30/2016   Procedure: NASAL SEPTOPLASTY WITH BILATERAL TURBINATE REDUCTION;  Surgeon: Rozetta Nunnery, MD;  Location: [facility];  Service: ENT;  Laterality: Bilateral;  . UVULECTOMY N/A 01/30/2016   Procedure: UVULECTOMY;  Surgeon: Rozetta Nunnery, MD;  Location: [name] OR;  Service: ENT;  Laterality: N/A;    SOCIAL HISTORY: Social History   Socioeconomic History  . Marital status: Married    Spouse name: [name]   . Number of children: Not on file  . Years of education: 42  . Highest education level: Not on file  Occupational History  . Occupation: Retired  Scientific laboratory technician  . Financial resource strain: Not on file  . Food insecurity    Worry: Not on file    Inability: Not on file  . Transportation needs    Medical: Not on file    Non-medical: Not on file  Tobacco Use  . Smoking status: Former Smoker    Packs/day: 2.00    Years: 25.00    Pack years: 50.00    Types: Cigarettes    Quit date: 05/20/1983    Years since quitting: 35.6  . Smokeless tobacco: Never Used  Substance and Sexual Activity  . Alcohol use: Not Currently    Alcohol/week: 0.0 standard drinks  . Drug use: No  . Sexual activity: Not on file  Lifestyle  . Physical activity    Days per week: Not on file    Minutes per session: Not on file  . Stress: Not on file  Relationships  . Social Herbalist on phone: Not on file    Gets together: Not on file    Attends religious service: Not on file    Active member of club or organization: Not on file    Attends meetings of clubs or organizations: Not on file    Relationship status: Not on file  . Intimate partner violence    Fear of current or ex partner: Not on file    Emotionally  abused: Not on file    Physically abused: Not on file    Forced sexual activity: Not on file  Other Topics Concern  . Not on file  Social History Narrative   Lives with wife, [name]   Caffeine use: daily (Coffee)    FAMILY HISTORY: Family History  Problem Relation Age of Onset  . Alzheimer's disease Mother   . Mental illness Mother   . Depression Mother   . Heart disease Father 81       had 5 MI's &  died 63 yo  . Alcohol abuse Son   . Stroke Maternal Grandmother     ALLERGIES:  is allergic to other.  MEDICATIONS:  Current Outpatient Medications  Medication Sig Dispense Refill  . acetaminophen (TYLENOL) 500 MG tablet Take 500 mg by mouth every 6 (six) hours as needed for mild pain.     Marland Kitchen albuterol (PROVENTIL HFA;VENTOLIN HFA) 108 (90 Base) MCG/ACT inhaler Inhale 2 puffs into the lungs every 6 (six) hours as needed for wheezing or shortness of breath. 1 Inhaler 3  . albuterol (PROVENTIL) (2.5 MG/3ML) 0.083% nebulizer solution Take 3 mLs (2.5 mg total) by nebulization every 6 (six) hours as needed for wheezing or shortness of breath. 75 mL 12  . allopurinol (ZYLOPRIM) 300 MG tablet Take 1 tablet (300 mg total) by mouth daily. 90 tablet 3  . aspirin EC 81 MG tablet Take 81 mg by mouth daily.    Marland Kitchen atorvastatin (LIPITOR) 40 MG tablet Take 1 tablet (40 mg total) by mouth daily. 90 tablet 3  . Black Pepper-Turmeric (TURMERIC COMPLEX/BLACK PEPPER PO) Take 1,000 mg by mouth daily.     . Cholecalciferol (VITAMIN D PO) Take 15,000 Units by mouth daily.     . diphenhydrAMINE (BENADRYL) 25 MG tablet Take 25 mg by mouth at bedtime.    . docusate sodium (COLACE) 100 MG capsule Take 100 mg by mouth 2 (two) times daily. 1 tablet in the morning and 1 tablet at bedtime    . doxazosin (CARDURA) 4 MG tablet Take 1 tablet (4 mg total) by mouth daily. 90 tablet 3  . finasteride (PROSCAR) 5 MG tablet Take 1 tablet (5 mg total) by mouth daily. 90 tablet 3  . fluticasone (FLONASE) 50 MCG/ACT nasal spray  Place 2 sprays into both nostrils at bedtime. PRN 16 g 2  . furosemide (LASIX) 40 MG tablet Take 1 tablet (40 mg total) by mouth daily. 90 tablet 3  . hydroxyurea (HYDREA) 500 MG capsule 1000 mg (2 caps) on Monday, Tuesday,wednesday and thursday and 500mg  (1cap) the rest of the days of the week .Take with food. 120 capsule 2  . levothyroxine (SYNTHROID, LEVOTHROID) 200 MCG tablet TAKE 1 TABLET EVERY DAY BEFORE BREAKFAST EXCEPT Sunday TAKE 1.5 TABS. 96 tablet 3  . losartan (COZAAR) 50 MG tablet TAKE 1 TABLET EVERY DAY 90 tablet 3  . Magnesium 400 MG TABS Take 1 tablet by mouth daily.    . metoprolol succinate (TOPROL-XL) 50 MG 24 hr tablet TAKE 1 TABLET EVERY DAY WITH FOOD  OR  IMMEDIATELY FOLLOWING A MEAL (Patient taking differently: 100 mg. ) 90 tablet 3  . omeprazole (PRILOSEC) 20 MG capsule TAKE 1 CAPSULE TWICE DAILY 180 capsule 1  . OVER THE COUNTER MEDICATION daily. Vitamin A to Z 50 plus    . polyethylene glycol (MIRALAX / GLYCOLAX) packet Take 17 g by mouth daily.    Marland Kitchen spironolactone (ALDACTONE) 25 MG tablet Take 1 tablet Daily for Heart & BP 90 tablet 3  . warfarin (COUMADIN) 5 MG tablet TAKE 1 AND 1/2 TABLETS EVERY DAY  OR AS DIRECTED 145 tablet 1  . zinc gluconate 50 MG tablet Take 50 mg by mouth daily.     No current facility-administered medications for this visit.     REVIEW OF SYSTEMS:    A 10+ POINT REVIEW OF SYSTEMS WAS OBTAINED including neurology, dermatology, psychiatry, cardiac, respiratory, lymph, extremities, GI, GU, Musculoskeletal, constitutional, breasts, reproductive, HEENT.  All pertinent positives are noted in the HPI.  All others are negative.   PHYSICAL EXAMINATION: . Vitals:   12/21/18 1202  BP: 121/62  Pulse: 70  Resp: 18  Temp: 97.8 F (36.6 C)  SpO2: 95%   Filed Weights   12/21/18 1202  Weight: 267 lb 8 oz (121.3 kg)   .Body mass index is 39.5 kg/m.  GENERAL:alert, in no acute distress and comfortable SKIN: no acute rashes, no significant  lesions EYES: conjunctiva are pink and non-injected, sclera anicteric OROPHARYNX: MMM, no exudates, no oropharyngeal erythema or ulceration NECK: supple, no JVD LYMPH:  no palpable lymphadenopathy in the cervical, axillary or inguinal regions LUNGS: clear to auscultation b/l with normal respiratory effort HEART: regular rate & rhythm ABDOMEN:  normoactive bowel sounds , non tender, not distended. No palpable hepatosplenomegaly.  Extremity: 1+ pedal edema PSYCH: alert & oriented x 3 with fluent speech NEURO: no focal motor/sensory deficits   LABORATORY DATA:  I have reviewed the data as listed  . CBC Latest Ref Rng & Units 12/21/2018 10/21/2018 09/29/2018  WBC 4.0 - 10.5 K/uL 16.7(H) 15.2(H) 17.4(H)  Hemoglobin 13.0 - 17.0 g/dL 16.3 16.7 17.2(H)  Hematocrit 39.0 - 52.0 % 52.9(H) 53.1(H) 51.6(H)  Platelets 150 - 400 K/uL 416(H) 479(H) 460(H)    . CMP Latest Ref Rng & Units 12/21/2018 10/21/2018 09/29/2018  Glucose 70 - 99 mg/dL 106(H) 125(H) 97  BUN 8 - 23 mg/dL 19 13 18   Creatinine 0.61 - 1.24 mg/dL 1.20 1.06 0.94  Sodium 135 - 145 mmol/L 137 136 132(L)  Potassium 3.5 - 5.1 mmol/L 4.7 4.4 5.5(H)  Chloride 98 - 111 mmol/L 101 100 95(L)  CO2 22 - 32 mmol/L 28 28 30   Calcium 8.9 - 10.3 mg/dL 9.5 9.5 9.6  Total Protein 6.5 - 8.1 g/dL 6.9 6.8 6.5  Total Bilirubin 0.3 - 1.2 mg/dL 1.0 1.1 1.3(H)  Alkaline Phos 38 - 126 U/L 73 74 -  AST 15 - 41 U/L 28 36 23  ALT 0 - 44 U/L 24 27 21    . Lab Results  Component Value Date   LDH 442 (H) 12/21/2018    02/09/18 JAK2 Mutation Study:    02/09/18 BCR ABL FISH:    RADIOGRAPHIC STUDIES: I have personally reviewed the radiological images as listed and agreed with the findings in the report. No results found.  ASSESSMENT & PLAN:   78 y.o. male with  1. 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 Some associated leukocytosis. No history  of splenectomy. No previous h/o VTE, but significant cardiac risk factors. 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   02/09/18 BCR ABL FISH revealed no evidence of BCR/ABL rearrangement   PLAN: -Discussed pt labwork today, 12/21/2018; PLTs are continuing to decrease, HCT is elevated -Increase hydroxyurea dosage to 1000mg  Hydroxyurea 5 days/week, and then 500mg  2 days/week -Avoid iron supplements -Goal for PLT between 200k and 400k -Recommend pt decrease laxatives given 4 bowel movements a day -Recommended sun protection strategies as Hydroxyurea can increase sensitivity of skin, and that he should watch for mouth sores as well  -Therapeutic phlebotomy today as scheduled -Will see the pt back in 2 months  2. . Patient Active Problem List   Diagnosis Date Noted  . Polycythemia vera ([place]) 10/21/2018  . Thrombocytosis ([place]) 12/29/2017  .  Senile purpura ([place]) 09/01/2017  . Emphysema of lung ([place]) 06/01/2017  . Tortuous aorta ([facility]) 06/01/2017  . ASHD/CABG x 3V (11/2013) 02/06/2015  . Encounter for monitoring Coumadin therapy 02/06/2015  . Cardiac pacemaker/AICD (01/2014) 02/06/2015  . Morbid obesity ([place]) 01/03/2015  . HTN (hypertension) 01/02/2015  . Mixed hyperlipidemia 01/02/2015  . Prediabetes 01/02/2015  . Vitamin D deficiency 01/02/2015  . GERD  01/02/2015  . BPH (benign prostatic hyperplasia) 01/02/2015  . Medication management 01/02/2015  . Atrial fibrillation ([place]) 01/02/2015  . Hypothyroidism 01/02/2015  . OSA (obstructive sleep apnea) 01/02/2015  -continue f/u with PCP to optimize atherosclerotic risk factors.   Orders Placed This Encounter  Procedures  . CBC with Differential/Platelet    Standing Status:   Future    Standing Expiration Date:   01/25/2020  . CMP ([place] only)    Standing Status:    Future    Standing Expiration Date:   12/21/2019     RTC with Dr Irene Limbo with labs and therapeutic phlebotomy appointment in 2 months   All of the patients questions were answered with apparent satisfaction. The patient knows to call the clinic with any problems, questions or concerns.  The total time spent in the appt was 15 minutes and more than 50% was on counseling and direct patient cares.  Sullivan Lone MD MS AAHIVMS [facility] Hematology/Oncology Physician [facility]  (Office):       [phone] (Work cell):  [phone] (Fax):           [phone]  12/21/2018 12:48 PM  I, De Burrs, am acting as a scribe for Dr. Irene Limbo  .I have reviewed the above documentation for accuracy and completeness, and I agree with the above. Brunetta Genera MD

## 2018-12-21 ENCOUNTER — Inpatient Hospital Stay: Payer: Medicare Other

## 2018-12-21 ENCOUNTER — Other Ambulatory Visit: Payer: Self-pay

## 2018-12-21 ENCOUNTER — Telehealth: Payer: Self-pay | Admitting: Hematology

## 2018-12-21 ENCOUNTER — Inpatient Hospital Stay: Payer: Medicare Other | Attending: Hematology

## 2018-12-21 ENCOUNTER — Inpatient Hospital Stay (HOSPITAL_BASED_OUTPATIENT_CLINIC_OR_DEPARTMENT_OTHER): Payer: Medicare Other | Admitting: Hematology

## 2018-12-21 VITALS — BP 130/82 | HR 75

## 2018-12-21 VITALS — BP 121/62 | HR 70 | Temp 97.8°F | Resp 18 | Ht 69.0 in | Wt 267.5 lb

## 2018-12-21 DIAGNOSIS — D45 Polycythemia vera: Secondary | ICD-10-CM

## 2018-12-21 DIAGNOSIS — D473 Essential (hemorrhagic) thrombocythemia: Secondary | ICD-10-CM | POA: Insufficient documentation

## 2018-12-21 DIAGNOSIS — Z1589 Genetic susceptibility to other disease: Secondary | ICD-10-CM

## 2018-12-21 DIAGNOSIS — I2510 Atherosclerotic heart disease of native coronary artery without angina pectoris: Secondary | ICD-10-CM

## 2018-12-21 DIAGNOSIS — D471 Chronic myeloproliferative disease: Secondary | ICD-10-CM

## 2018-12-21 LAB — CMP (CANCER CENTER ONLY)
ALT: 24 U/L (ref 0–44)
AST: 28 U/L (ref 15–41)
Albumin: 4.2 g/dL (ref 3.5–5.0)
Alkaline Phosphatase: 73 U/L (ref 38–126)
Anion gap: 8 (ref 5–15)
BUN: 19 mg/dL (ref 8–23)
CO2: 28 mmol/L (ref 22–32)
Calcium: 9.5 mg/dL (ref 8.9–10.3)
Chloride: 101 mmol/L (ref 98–111)
Creatinine: 1.2 mg/dL (ref 0.61–1.24)
GFR, Est AFR Am: >60 mL/min (ref >60)
GFR, Estimated: 58 mL/min — ABNORMAL LOW (ref >60)
Glucose, Bld: 106 mg/dL — ABNORMAL HIGH (ref 70–99)
Potassium: 4.7 mmol/L (ref 3.5–5.1)
Sodium: 137 mmol/L (ref 135–145)
Total Bilirubin: 1 mg/dL (ref 0.3–1.2)
Total Protein: 6.9 g/dL (ref 6.5–8.1)

## 2018-12-21 LAB — CBC WITH DIFFERENTIAL/PLATELET
Abs Immature Granulocytes: 0.22 10*3/uL — ABNORMAL HIGH (ref 0.00–0.07)
Basophils Absolute: 0.2 10*3/uL — ABNORMAL HIGH (ref 0.0–0.1)
Basophils Relative: 1 %
Eosinophils Absolute: 0.2 10*3/uL (ref 0.0–0.5)
Eosinophils Relative: 1 %
HCT: 52.9 % — ABNORMAL HIGH (ref 39.0–52.0)
Hemoglobin: 16.3 g/dL (ref 13.0–17.0)
Immature Granulocytes: 1 %
Lymphocytes Relative: 11 %
Lymphs Abs: 1.8 10*3/uL (ref 0.7–4.0)
MCH: 30 pg (ref 26.0–34.0)
MCHC: 30.8 g/dL (ref 30.0–36.0)
MCV: 97.4 fL (ref 80.0–100.0)
Monocytes Absolute: 0.5 10*3/uL (ref 0.1–1.0)
Monocytes Relative: 3 %
Neutro Abs: 13.8 10*3/uL — ABNORMAL HIGH (ref 1.7–7.7)
Neutrophils Relative %: 83 %
Platelets: 416 10*3/uL — ABNORMAL HIGH (ref 150–400)
RBC: 5.43 MIL/uL (ref 4.22–5.81)
RDW: 16.6 % — ABNORMAL HIGH (ref 11.5–15.5)
WBC: 16.7 10*3/uL — ABNORMAL HIGH (ref 4.0–10.5)
nRBC: 0 % (ref 0.0–0.2)

## 2018-12-21 LAB — LACTATE DEHYDROGENASE: LDH: 442 U/L — ABNORMAL HIGH (ref 98–192)

## 2018-12-21 MED ORDER — HYDROXYUREA 500 MG PO CAPS: ORAL_CAPSULE | ORAL | 2 refills | Status: DC

## 2018-12-21 NOTE — Telephone Encounter (Signed)
Gave avs and calendar ° °

## 2018-12-21 NOTE — Progress Notes (Signed)
[name] [name] presents today for phlebotomy per MD Irene Limbo orders. Phlebotomy procedure started at 1322 and ended at 1328. 560 grams removed. Patient observed for 30 minutes after procedure without any incident.  VSS. Patient tolerated procedure well.  Ate and drank after procedure without any issues.  A&Ox4 and ambulatory w/steady gait to exit with belongings. 16 G RAC IV needle removed intact.

## 2018-12-21 NOTE — Patient Instructions (Signed)

## 2018-12-23 ENCOUNTER — Encounter: Payer: Self-pay | Admitting: Internal Medicine

## 2019-01-03 ENCOUNTER — Ambulatory Visit: Payer: Self-pay | Admitting: Adult Health

## 2019-01-06 NOTE — Progress Notes (Signed)
FOLLOW UP  Assessment and Plan:   Essential hypertension At goal; continue medications Monitor blood pressure at home; call if consistently over 130/80 Continue DASH diet.   Reminder to go to the ER if any CP, SOB, nausea, dizziness, severe HA, changes vision/speech, left arm numbness and tingling and jaw pain.  Paroxysmal atrial fibrillation ([place]) Continue follow up with a fib clinic for coumadin management; rate controled today Discussed if patient falls to immediately contact office or go to ER. Discussed foods that can increase or decrease Coumadin levels. Patient understands to call the office before starting a new medication.  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   Hypothyroidism, unspecified type continue medications the same reminded to take on an empty stomach 30-39mins before food.  -     TSH  Mixed hyperlipidemia At goal for LDL; continue statin- discussed diet for borderline trigs Continue low cholesterol diet and exercise.  -     Lipid panel  Prediabetes Discussed disease and risks Discussed diet/exercise, weight management  A1C, insulin level annually; monitor weight and serum glucose  Medication management -     CBC with Differential/Platelet -     CMP/GFR  Morbid obesity ([place]) Long discussion about weight loss, diet, and exercise Recommended diet heavy in fruits and veggies and low in animal meats, cheeses, and dairy products, appropriate calorie intake Discussed appropriate weight for height  Follow up in 3 months  Vitamin D deficiency At goal; continue supplementation Defer vitamin D level  Memory changes He reports some increasing memory problems in the last few weeks to months, difficulty with word recall. He is not highly concerned, declines imaging or neuro referral, just wanted to mention as he does have reported family history of dementia in later life. He has never had B12 check, no history  of screening for syphylis, HIV, will check in addition to standard CMP, TSH Control blood pressure, cholesterol, glucose, increase exercise.    Continue diet and meds as discussed. Further disposition pending results of labs. Discussed med's effects and SE's.   Over 30 minutes of exam, counseling, chart review, and critical decision making was performed.   Future Appointments  Date Time Provider [place]  02/23/2019 11:30 AM [facility] LAB 3 [facility] None  02/23/2019 12:00 PM Brunetta Genera, MD [facility] None  02/23/2019  1:00 PM [facility] INFUSION [facility] None  04/12/2019  2:00 PM [name], MD [facility] None  10/11/2019  2:00 PM [name], NP [facility] None    ----------------------------------------------------------------------------------------------------------------------  HPI 78 y.o. male  presents for 3 month follow up on hypertension, cholesterol, prediabetes, obesity and vitamin D deficiency.   He has upcoming bilateral laser eye surgery scheduled.   He reports some increasing memory problems in the last few weeks to months, difficulty with word recall. He is not highly concerned, declines imaging or neuro referral, just wanted to mention as he does have reported family history of dementia in later life. He has never had B12 check, no history of screening for syphylis, HIV.   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  Has PRN albuterol PRN.   he has a diagnosis of GERD which is currently managed by omeprazole 20 mg BID, reports hasn't done well with attempts to taper he reports symptoms is  currently well controlled, and denies breakthrough reflux, burning in chest, hoarseness or cough.    BMI is Body  mass index is 39.43 kg/m., he has been working on diet, admits not exercising consistently, intermittently will walk on treadmill and lifting weights, has restarted again as of yesterday, working on getting up to  [facility] Readings from Last 3 Encounters:  01/10/19 267 lb (121.1 kg)  12/21/18 267 lb 8 oz (121.3 kg)  10/21/18 261 lb 6.4 oz (118.6 kg)   In 2015, he underwent a CABG x 3 V w/po Afib- and failed 2 attempts at VCV. Then in 07/2014, he had a BiVent, AICD/Defib/PM planted.  He is followed at his Cardiologist's coumadin clinic via [place].  His blood pressure has been controlled at home, today their BP is BP: 124/72  He does workout. He denies chest pain, shortness of breath, dizziness.   He is on cholesterol medication (atorvastatin 40 mg daily) and denies myalgias. His cholesterol is at goal (cardiology has not recommended change). The cholesterol last visit was:   Lab Results  Component Value Date   CHOL 147 09/29/2018   HDL 20 (L) 09/29/2018   LDLCALC 97 09/29/2018   TRIG 201 (H) 09/29/2018   CHOLHDL 7.4 (H) 09/29/2018    He has been working on diet and exercise for prediabetes, and denies foot ulcerations, increased appetite, nausea, paresthesia of the feet, polydipsia, polyuria, visual disturbances and vomiting. Last A1C in the office was:  Lab Results  Component Value Date   HGBA1C 5.8 (H) 09/29/2018   He is on thyroid medication. His medication was not changed last visit.   Lab Results  Component Value Date   TSH 3.24 09/29/2018   [name]   Lab Results  Component Value Date   VD25OH 31 09/29/2018        Current Medications:  Current Outpatient Medications on File Prior to Visit  Medication Sig  . acetaminophen (TYLENOL) 500 MG tablet Take 500 mg by mouth every 6 (six) hours as needed for mild pain.   Marland Kitchen allopurinol (ZYLOPRIM) 300 MG tablet Take 1 tablet (300 mg total) by mouth daily.  Marland Kitchen aspirin EC 81 MG tablet Take 81 mg by mouth daily.   Marland Kitchen atorvastatin (LIPITOR) 40 MG tablet Take 1 tablet (40 mg total) by mouth daily.  . Black Pepper-Turmeric (TURMERIC COMPLEX/BLACK PEPPER PO) Take 1,000 mg by mouth daily.   . Cholecalciferol (VITAMIN D PO) Take 15,000 Units by mouth daily.   . diphenhydrAMINE (BENADRYL) 25 MG tablet Take 25 mg by mouth at bedtime.  . docusate sodium (COLACE) 100 MG capsule Take 100 mg by mouth 2 (two) times daily. 1 tablet in the morning and 1 tablet at bedtime  . doxazosin (CARDURA) 4 MG tablet Take 1 tablet (4 mg total) by mouth daily.  . finasteride (PROSCAR) 5 MG tablet Take 1 tablet (5 mg total) by mouth daily.  . furosemide (LASIX) 40 MG tablet Take 1 tablet (40 mg total) by mouth daily.  . hydroxyurea (HYDREA) 500 MG capsule 1000 mg (2 caps) on Monday, Tuesday,wednesday,thursday and friday and 500mg  (1cap) the rest of the days of the week .Take with food.  Marland Kitchen levothyroxine (SYNTHROID, LEVOTHROID) 200 MCG tablet TAKE 1 TABLET EVERY DAY BEFORE BREAKFAST EXCEPT Sunday TAKE 1.5 TABS.  Marland Kitchen losartan (COZAAR) 50 MG tablet TAKE 1 TABLET EVERY DAY  . Magnesium 400 MG TABS Take 1 tablet by mouth daily.  Marland Kitchen omeprazole (PRILOSEC) 20 MG capsule TAKE 1  CAPSULE TWICE DAILY  . OVER THE COUNTER MEDICATION daily. Vitamin A to Z 50 plus  . polyethylene glycol (MIRALAX / GLYCOLAX) packet Take 17 g by mouth daily.  Marland Kitchen spironolactone (ALDACTONE) 25 MG tablet Take 1 tablet Daily for Heart & BP  . warfarin (COUMADIN) 5 MG tablet TAKE 1 AND 1/2 TABLETS EVERY DAY  OR AS DIRECTED  . zinc gluconate 50 MG tablet Take 50 mg by mouth daily.  Marland Kitchen albuterol (PROVENTIL HFA;VENTOLIN HFA) 108 (90 Base) MCG/ACT inhaler Inhale 2 puffs into the lungs every 6 (six) hours as needed for wheezing or shortness of breath. (Patient not taking: Reported on 01/10/2019)  . albuterol (PROVENTIL) (2.5 MG/3ML) 0.083% nebulizer solution Take 3 mLs (2.5 mg total) by nebulization every 6 (six) hours as needed for wheezing or shortness of breath. (Patient not taking:  Reported on 01/10/2019)  . fluticasone (FLONASE) 50 MCG/ACT nasal spray Place 2 sprays into both nostrils at bedtime. PRN (Patient not taking: Reported on 01/10/2019)  . metoprolol succinate (TOPROL-XL) 50 MG 24 hr tablet TAKE 1 TABLET EVERY DAY WITH FOOD  OR  IMMEDIATELY FOLLOWING A MEAL (Patient taking differently: 100 mg. )   No current facility-administered medications on file prior to visit.      Allergies:  Allergies  Allergen Reactions  . Other Swelling    SODIUM SENSITIVE     Medical History:  Past Medical History:  Diagnosis Date  . A-fib ([place]) 01/2014  . AICD (automatic cardioverter/defibrillator) present    [employer]  . Arthritis   . ASHD (arteriosclerotic heart disease) 2015   s/p CABG  . Cataract    s/p left  . COPD (chronic obstructive pulmonary disease) ([place])    by CXR, pt unsure of this  . GERD (gastroesophageal reflux disease)   . Gout 2014  . Hyperlipidemia   . Hypertension   . Hypothyroidism   . LBBB (left bundle branch block)   . Pre-diabetes   . Prediabetes 01/2014  . Presence of permanent cardiac pacemaker   . Sleep apnea    no CPAP  . Thyroid disease    hypothyroid   Family history- Reviewed and unchanged Social history- Reviewed and unchanged   Review of Systems:  Review of Systems  Constitutional: Negative for malaise/fatigue and weight loss.  HENT: Negative for hearing loss and tinnitus.   Eyes: Negative for blurred vision and double vision.  Respiratory: Negative for cough, shortness of breath and wheezing.   Cardiovascular: Negative for chest pain, palpitations, orthopnea, claudication and leg swelling.  Gastrointestinal: Negative for abdominal pain, blood in stool, constipation, diarrhea, heartburn, melena, nausea and vomiting.  Genitourinary: Negative.   Musculoskeletal: Negative for falls, joint pain and myalgias.  Skin: Negative for rash.  Neurological: Negative for dizziness, tingling, sensory change, weakness and  headaches.  Endo/Heme/Allergies: Negative for polydipsia.  Psychiatric/Behavioral: Positive for memory loss (slow gradual changes, ). Negative for depression, hallucinations and substance abuse. The patient is not nervous/anxious and does not have insomnia.   All other systems reviewed and are negative.     Physical Exam: BP 124/72   Pulse 72   Temp (!) 97.3 F (36.3 C)   Ht 5\' 9"  (1.753 m)   Wt 267 lb (121.1 kg)   SpO2 97%   BMI 39.43 kg/m  Wt Readings from Last 3 Encounters:  01/10/19 267 lb (121.1 kg)  12/21/18 267 lb 8 oz (121.3 kg)  10/21/18 261 lb 6.4 oz (118.6 kg)   General Appearance: Well nourished, in  no apparent distress. Eyes: PERRLA, EOMs, conjunctiva no swelling or erythema Sinuses: No Frontal/maxillary tenderness ENT/Mouth: Ext aud canals clear, TMs without erythema, bulging. No erythema, swelling, or exudate on post pharynx.  Tonsils not swollen or erythematous. Hearing normal.  Neck: Supple, thyroid normal.  Respiratory: Respiratory effort normal, BS equal bilaterally without rales, rhonchi, wheezing or stridor.  Cardio: RRR with no MRGs. Brisk peripheral pulses without edema.  Abdomen: Soft, obese, + BS.  Non tender, no guarding, rebound, hernias, masses. Lymphatics: Non tender without lymphadenopathy.  Musculoskeletal: Full ROM, 5/5 strength, Normal gait Skin: Warm, dry without rashes, lesions, ecchymosis.  Neuro: Cranial nerves intact. No cerebellar symptoms. 2/3 word recall, some cognitive slowing, mild word recall difficulty  Psych: Awake and oriented X 3, normal affect, Insight and Judgment appropriate.    Izora Ribas, NP 12:20 PM [facility]

## 2019-01-10 ENCOUNTER — Ambulatory Visit (INDEPENDENT_AMBULATORY_CARE_PROVIDER_SITE_OTHER): Payer: Medicare Other | Admitting: Adult Health

## 2019-01-10 ENCOUNTER — Other Ambulatory Visit: Payer: Self-pay

## 2019-01-10 ENCOUNTER — Encounter: Payer: Self-pay | Admitting: Adult Health

## 2019-01-10 VITALS — BP 124/72 | HR 72 | Temp 97.3°F | Ht 69.0 in | Wt 267.0 lb

## 2019-01-10 DIAGNOSIS — R7303 Prediabetes: Secondary | ICD-10-CM | POA: Diagnosis not present

## 2019-01-10 DIAGNOSIS — D45 Polycythemia vera: Secondary | ICD-10-CM | POA: Diagnosis not present

## 2019-01-10 DIAGNOSIS — Z79899 Other long term (current) drug therapy: Secondary | ICD-10-CM

## 2019-01-10 DIAGNOSIS — K219 Gastro-esophageal reflux disease without esophagitis: Secondary | ICD-10-CM

## 2019-01-10 DIAGNOSIS — I10 Essential (primary) hypertension: Secondary | ICD-10-CM

## 2019-01-10 DIAGNOSIS — E782 Mixed hyperlipidemia: Secondary | ICD-10-CM | POA: Diagnosis not present

## 2019-01-10 DIAGNOSIS — I480 Paroxysmal atrial fibrillation: Secondary | ICD-10-CM | POA: Diagnosis not present

## 2019-01-10 DIAGNOSIS — R413 Other amnesia: Secondary | ICD-10-CM

## 2019-01-10 DIAGNOSIS — Z114 Encounter for screening for human immunodeficiency virus [HIV]: Secondary | ICD-10-CM | POA: Diagnosis not present

## 2019-01-10 DIAGNOSIS — D473 Essential (hemorrhagic) thrombocythemia: Secondary | ICD-10-CM

## 2019-01-10 DIAGNOSIS — E039 Hypothyroidism, unspecified: Secondary | ICD-10-CM | POA: Diagnosis not present

## 2019-01-10 DIAGNOSIS — E559 Vitamin D deficiency, unspecified: Secondary | ICD-10-CM

## 2019-01-10 DIAGNOSIS — I2510 Atherosclerotic heart disease of native coronary artery without angina pectoris: Secondary | ICD-10-CM

## 2019-01-10 DIAGNOSIS — D75839 Thrombocytosis, unspecified: Secondary | ICD-10-CM

## 2019-01-10 NOTE — Patient Instructions (Addendum)
Try adding "align" daily probiotic for 1 month which may help with bloating and other stomach symptoms  Can try adding gas-x or simethicone products for bloating as needed if not improving    Memory Compensation Strategies  1. Use "WARM" strategy.  W= write it down  A= associate it  R= repeat it  M= make a mental note  2.   You can keep a Social worker.  Use a 3-ring notebook with sections for the following: calendar, important names and phone numbers,  medications, doctors' names/phone numbers, lists/reminders, and a section to journal what you did  each day.   3.    Use a calendar to write appointments down.  4.    Write yourself a schedule for the day.  This can be placed on the calendar or in a separate section of the Memory Notebook.  Keeping a  regular schedule can help memory.  5.    Use medication organizer with sections for each day or morning/evening pills.  You may need help loading it  6.    Keep a basket, or pegboard by the door.  Place items that you need to take out with you in the basket or on the pegboard.  You may also want to  include a message board for reminders.  7.    Use sticky notes.  Place sticky notes with reminders in a place where the task is performed.  For example: " turn off the  stove" placed by the stove, "lock the door" placed on the door at eye level, " take your medications" on  the bathroom mirror or by the place where you normally take your medications.  8.    Use alarms/timers.  Use while cooking to remind yourself to check on food or as a reminder to take your medicine, or as a  reminder to make a call, or as a reminder to perform another task, etc.     Dementia Dementia is a condition that affects the way the brain functions. It often affects memory and thinking. Usually, dementia gets worse with time and cannot be reversed (progressive dementia). There are many types of dementia, including:  Alzheimer's disease. This type is the  most common.  Vascular dementia. This type may happen as the result of a stroke.  Lewy body dementia. This type may happen to people who have Parkinson's disease.  Frontotemporal dementia. This type is caused by damage to nerve cells (neurons) in certain parts of the brain. Some people may be affected by more than one type of dementia. This is called mixed dementia. What are the causes? Dementia is caused by damage to cells in the brain. The area of the brain and the types of cells damaged determine the type of dementia. Usually, this damage is irreversible or cannot be undone. Some examples of irreversible causes include:  Conditions that affect the blood vessels of the brain, such as diabetes, heart disease, or blood vessel disease.  Genetic mutations. In some cases, changes in the brain may be caused by another condition and can be reversed or slowed. Some examples of reversible causes include:  Injury to the brain.  Certain medicines.  Infection, such as meningitis.  Metabolic problems, such as vitamin B12 deficiency or thyroid disease.  Pressure on the brain, such as from a tumor or blood clot. What are the signs or symptoms? Symptoms of dementia depend on the type of dementia. Common signs of dementia include problems with remembering, thinking, problem solving,  decision making, and communicating. These signs develop slowly or get worse with time. This may include:  Problems remembering things.  Having trouble taking a bath or putting clothes on.  Forgetting appointments.  Forgetting to pay bills.  Difficulty planning and preparing meals.  Having trouble speaking.  Getting lost easily. How is this diagnosed? This condition is diagnosed by a specialist (neurologist). It is diagnosed based on the history of your symptoms, your medical history, a physical exam, and tests. Tests may include:  Tests to evaluate brain function, such as memory tests, cognitive tests, and  other tests.  Lab tests, such as blood or urine tests.  Imaging tests, such as a CT scan, a PET scan, or an MRI.  Genetic testing. This may be done if other family members have a diagnosis of certain types of dementia. Your health care provider will talk with you and your family, friends, or caregivers about your history and symptoms. How is this treated?  Treatment for this condition depends on the cause of the dementia. Progressive dementias, such as Alzheimer's disease, cannot be cured, but there may be treatments that help to manage symptoms. Treatment might involve taking medicines that may help to:  Control the dementia.  Slow down the progression of the dementia.  Manage symptoms. In some cases, treating the cause of your dementia can improve symptoms, reverse symptoms, or slow down how quickly your dementia becomes worse. Your health care provider can direct you to support groups, organizations, and other health care providers who can help with decisions about your care. Follow these instructions at home: Medicines  Take over-the-counter and prescription medicines only as told by your health care provider.  Use a pill organizer or pill reminder to help you manage your medicines.  Avoid taking medicines that can affect thinking, such as pain medicines or sleeping medicines. Lifestyle  Make healthy lifestyle choices. ? Be physically active as told by your health care provider. ? Do not use any products that contain nicotine or tobacco, such as cigarettes, e-cigarettes, and chewing tobacco. If you need help quitting, ask your health care provider. ? Do not drink alcohol. ? Practice stress-management techniques when you get stressed. ? Spend time with other people.  Make sure to get quality sleep. These tips can help you get a good night's rest: ? Avoid napping during the day. ? Keep your sleeping area dark and cool. ? Avoid exercising during the few hours before you go to  bed. ? Avoid caffeine products in the evening. Eating and drinking  Drink enough fluid to keep your urine pale yellow.  Eat a healthy diet. General instructions   Work with your health care provider to determine what you need help with and what your safety needs are.  Talk with your health care provider about whether it is safe for you to drive.  If you were given a bracelet that identifies you as a person with memory loss or tracks your location, make sure to wear it at all times.  Work with your family to make important decisions, such as advance directives, medical power of attorney, or a living will.  Keep all follow-up visits as told by your health care provider. This is important. Where to find more information  Alzheimer's Association: CapitalMile.co.nz  National Institute on Aging: DVDEnthusiasts.nl  World Health Organization: RoleLink.com.br Contact a health care provider if:  You have any new or worsening symptoms.  You have problems with choking or swallowing. Get help right away if:  You feel depressed or sad, or feel that you want to harm yourself.  Your family members become concerned for your safety. If you ever feel like you may hurt yourself or others, or have thoughts about taking your own life, get help right away. You can go to your nearest emergency department or call:  Your local emergency services (911 in the [place]  A suicide crisis helpline, such as the [place] at 510-885-0522. This is open 24 hours a day. Summary  Dementia is a condition that affects the way the brain functions. Dementia often affects memory and thinking.  Usually, dementia gets worse with time and cannot be reversed (progressive dementia).  Treatment for this condition depends on the cause of the dementia.  Work with your health care provider to determine what you need help with and what your safety needs are.  Your health care provider can  direct you to support groups, organizations, and other health care providers who can help with decisions about your care. This information is not intended to replace advice given to you by your health care provider. Make sure you discuss any questions you have with your health care provider. Document Released: 10/29/2000 Document Revised: 07/20/2018 Document Reviewed: 07/20/2018 Elsevier Patient Education  2020 [employer].

## 2019-01-11 LAB — VITAMIN B12: Vitamin B-12: 1625 pg/mL — ABNORMAL HIGH (ref 200–1100)

## 2019-01-11 LAB — COMPLETE METABOLIC PANEL WITH GFR
AG Ratio: 2.2 (calc) (ref 1.0–2.5)
ALT: 20 U/L (ref 9–46)
AST: 24 U/L (ref 10–35)
Albumin: 4.4 g/dL (ref 3.6–5.1)
Alkaline phosphatase (APISO): 68 U/L (ref 35–144)
BUN: 20 mg/dL (ref 7–25)
CO2: 29 mmol/L (ref 20–32)
Calcium: 9.7 mg/dL (ref 8.6–10.3)
Chloride: 100 mmol/L (ref 98–110)
Creat: 1.12 mg/dL (ref 0.70–1.18)
GFR, Est African American: 73 mL/min/{1.73_m2} (ref >=60)
GFR, Est Non African American: 63 mL/min/{1.73_m2} (ref >=60)
Globulin: 2 g/dL (calc) (ref 1.9–3.7)
Glucose, Bld: 137 mg/dL — ABNORMAL HIGH (ref 65–99)
Potassium: 4.6 mmol/L (ref 3.5–5.3)
Sodium: 137 mmol/L (ref 135–146)
Total Bilirubin: 0.9 mg/dL (ref 0.2–1.2)
Total Protein: 6.4 g/dL (ref 6.1–8.1)

## 2019-01-11 LAB — LIPID PANEL
Cholesterol: 126 mg/dL (ref <200)
HDL: 21 mg/dL — ABNORMAL LOW (ref >=40)
LDL Cholesterol (Calc): 80 mg/dL (calc)
Non-HDL Cholesterol (Calc): 105 mg/dL (calc) (ref <130)
Total CHOL/HDL Ratio: 6 (calc) — ABNORMAL HIGH (ref <5.0)
Triglycerides: 153 mg/dL — ABNORMAL HIGH (ref <150)

## 2019-01-11 LAB — CBC WITH DIFFERENTIAL/PLATELET
Absolute Monocytes: 474 cells/uL (ref 200–950)
Basophils Absolute: 205 cells/uL — ABNORMAL HIGH (ref 0–200)
Basophils Relative: 1.3 %
Eosinophils Absolute: 174 cells/uL (ref 15–500)
Eosinophils Relative: 1.1 %
HCT: 47.2 % (ref 38.5–50.0)
Hemoglobin: 15.5 g/dL (ref 13.2–17.1)
Lymphs Abs: 1454 cells/uL (ref 850–3900)
MCH: 30.3 pg (ref 27.0–33.0)
MCHC: 32.8 g/dL (ref 32.0–36.0)
MCV: 92.2 fL (ref 80.0–100.0)
MPV: 11.5 fL (ref 7.5–12.5)
Monocytes Relative: 3 %
Neutro Abs: 13493 cells/uL — ABNORMAL HIGH (ref 1500–7800)
Neutrophils Relative %: 85.4 %
Platelets: 375 10*3/uL (ref 140–400)
RBC: 5.12 10*6/uL (ref 4.20–5.80)
RDW: 15.8 % — ABNORMAL HIGH (ref 11.0–15.0)
Total Lymphocyte: 9.2 %
WBC: 15.8 10*3/uL — ABNORMAL HIGH (ref 3.8–10.8)

## 2019-01-11 LAB — HIV ANTIBODY (ROUTINE TESTING W REFLEX): HIV 1&2 Ab, 4th Generation: NONREACTIVE

## 2019-01-11 LAB — RPR: RPR Ser Ql: NONREACTIVE

## 2019-01-11 LAB — TSH: TSH: 1.22 mIU/L (ref 0.40–4.50)

## 2019-01-11 LAB — MAGNESIUM: Magnesium: 1.7 mg/dL (ref 1.5–2.5)

## 2019-01-12 DIAGNOSIS — Z7901 Long term (current) use of anticoagulants: Secondary | ICD-10-CM | POA: Diagnosis not present

## 2019-01-12 DIAGNOSIS — I255 Ischemic cardiomyopathy: Secondary | ICD-10-CM | POA: Diagnosis not present

## 2019-01-12 DIAGNOSIS — I480 Paroxysmal atrial fibrillation: Secondary | ICD-10-CM | POA: Diagnosis not present

## 2019-01-12 DIAGNOSIS — Z95810 Presence of automatic (implantable) cardiac defibrillator: Secondary | ICD-10-CM | POA: Diagnosis not present

## 2019-01-18 ENCOUNTER — Other Ambulatory Visit: Payer: Self-pay | Admitting: Physician Assistant

## 2019-01-21 DIAGNOSIS — H26492 Other secondary cataract, left eye: Secondary | ICD-10-CM | POA: Diagnosis not present

## 2019-01-28 DIAGNOSIS — H26491 Other secondary cataract, right eye: Secondary | ICD-10-CM | POA: Diagnosis not present

## 2019-02-02 DIAGNOSIS — Z95810 Presence of automatic (implantable) cardiac defibrillator: Secondary | ICD-10-CM | POA: Diagnosis not present

## 2019-02-02 DIAGNOSIS — I480 Paroxysmal atrial fibrillation: Secondary | ICD-10-CM | POA: Diagnosis not present

## 2019-02-02 DIAGNOSIS — I255 Ischemic cardiomyopathy: Secondary | ICD-10-CM | POA: Diagnosis not present

## 2019-02-02 DIAGNOSIS — Z7901 Long term (current) use of anticoagulants: Secondary | ICD-10-CM | POA: Diagnosis not present

## 2019-02-09 DIAGNOSIS — I255 Ischemic cardiomyopathy: Secondary | ICD-10-CM | POA: Diagnosis not present

## 2019-02-11 DIAGNOSIS — I480 Paroxysmal atrial fibrillation: Secondary | ICD-10-CM | POA: Diagnosis not present

## 2019-02-11 DIAGNOSIS — I10 Essential (primary) hypertension: Secondary | ICD-10-CM | POA: Diagnosis not present

## 2019-02-11 DIAGNOSIS — I2510 Atherosclerotic heart disease of native coronary artery without angina pectoris: Secondary | ICD-10-CM | POA: Diagnosis not present

## 2019-02-11 DIAGNOSIS — Z95810 Presence of automatic (implantable) cardiac defibrillator: Secondary | ICD-10-CM | POA: Diagnosis not present

## 2019-02-11 DIAGNOSIS — R0600 Dyspnea, unspecified: Secondary | ICD-10-CM | POA: Diagnosis not present

## 2019-02-12 DIAGNOSIS — Z23 Encounter for immunization: Secondary | ICD-10-CM | POA: Diagnosis not present

## 2019-02-16 DIAGNOSIS — I255 Ischemic cardiomyopathy: Secondary | ICD-10-CM | POA: Diagnosis not present

## 2019-02-16 DIAGNOSIS — Z7901 Long term (current) use of anticoagulants: Secondary | ICD-10-CM | POA: Diagnosis not present

## 2019-02-16 DIAGNOSIS — Z95810 Presence of automatic (implantable) cardiac defibrillator: Secondary | ICD-10-CM | POA: Diagnosis not present

## 2019-02-16 DIAGNOSIS — I480 Paroxysmal atrial fibrillation: Secondary | ICD-10-CM | POA: Diagnosis not present

## 2019-02-18 NOTE — Progress Notes (Signed)
HEMATOLOGY/ONCOLOGY CLINIC NOTE  Date of Service: 02/23/2019  Patient Care Team: [name], MD as PCP - General (Internal Medicine) Georg Ruddle, Ashok Cordia, MD as Referring Physician (Cardiology) Shirlee More Ronalee Red, MD as Referring Physician (Cardiology) Brunetta Genera, MD as Consulting Physician (Hematology)  CHIEF COMPLAINTS/PURPOSE OF CONSULTATION:  F/u for MPN  HISTORY OF PRESENTING ILLNESS:   [name] [name] is a wonderful 78 y.o. male who has been referred to [place] by Dr. [name]  for evaluation and management of Thrombocytosis. He is accompanied today by his wife. The pt reports that he is doing well overall.   The pt reports that he has never had any blood clots.  He has atrial fibrillation and has taken Coumadin since 2015. He had heart surgery in 2015, and had his ICD placed in 2016. The pt also takes 81g aspirin daily.   The pt notes that he has not been aware of his high platelets previously, but only as of his most recent labs. He denies any recent major bleeds, surgeries, and other trauma. He denies having a splenectomy at any time as well.   The pt notes that his right kidney gets sore from time to time when he doesn't stay well hydrated. He also notes that when he urinates at these times, his urine is very dark. The pt adds that he had kidney stones one time. The pt is also taking Lasix and notes that he urinates frequently.   The pt adds that his gums have been very sore, even to touch from his tongue.  The pt notes that he does not want to use a CPAP after a bad experience with a previous sleep study that did diagnose him with sleep apnea. He notes that he falls asleep several times a day as well, falling asleep easily when he sits down, and denying falling asleep while driving. He also notes some increased difficulty remembering.   Most recent lab results (12/09/17) of CBC is as follows: all values are WNL except for WBC at 11.9k, RDW at 18.4, PLT at  649k, ANC at 9.7k.  On review of systems, pt reports good energy levels, falling asleep several times a day, staying active, and denies recent surgery, recent trauma, bleeding, unexpected weight loss, pain along the spine, abdominal pains, and any other symptoms.   On Family Hx the pt reports prostate cancer.   Interval History:   [name] [name] returns today for management and evaluation of his newly diagnosed JAK2 positive MPN. The patient's last visit with [place] was on 12/21/2018. The pt reports that he is doing well overall.  The pt reports that he has been pretty good but he recently saw his Cardiologist who changed some of his medication. His Losartan was increased to 100 mg per day and his Warfarin was adjusted as well because he is taking omega-3 fatty acids from salmon.. Pt is still on the same dose of Spironolactone and Furosemide. Pt reports consistent levels of leg swelling. He is still taking his Hydroxyurea as prescribed. Pt saw his Opthalmologist and was sent to a specialist that helped with the scar tissue caused by his previous Cataracts surgery. Pt describes a frequency in the sensation to urinate. He has woken up as many as 3-4 times per night to urinate.   He has been keeping busy with his family and everyone is doing well.   Lab results today (02/23/19) of CBC w/diff and CMP is as follows: all values are WNL except  for WBC at 16.5K, RDW at 17.0, PLTs at 412K, Neutro Abs at 13.8K, Basophils Abs at 0.2K, Abs Immature Granulocytes at 0.19K. Glucose at 135, GFR Est Non Af Am at 59.  On review of systems, pt reports consistent leg swelling, polyuria and denies chest pains, SOB, and any other symptoms.   MEDICAL HISTORY:  Past Medical History:  Diagnosis Date  . A-fib ([place]) 01/2014  . AICD (automatic cardioverter/defibrillator) present    [employer]  . Arthritis   . ASHD (arteriosclerotic heart disease) 2015   s/p CABG  . Cataract    s/p left  . COPD (chronic  obstructive pulmonary disease) ([place])    by CXR, pt unsure of this  . GERD (gastroesophageal reflux disease)   . Gout 2014  . Hyperlipidemia   . Hypertension   . Hypothyroidism   . LBBB (left bundle branch block)   . Pre-diabetes   . Prediabetes 01/2014  . Presence of permanent cardiac pacemaker   . Sleep apnea    no CPAP  . Thyroid disease    hypothyroid    SURGICAL HISTORY: Past Surgical History:  Procedure Laterality Date  . CARDIAC DEFIBRILLATOR PLACEMENT  07/2014  . CATARACT EXTRACTION W/ INTRAOCULAR LENS IMPLANT Left   . CORONARY ARTERY BYPASS GRAFT  11/2013  . lumb laminectomy  [phone]   x 3  . LUMBAR FUSION  2013  . NASAL SEPTOPLASTY W/ TURBINOPLASTY Bilateral 01/30/2016   Procedure: NASAL SEPTOPLASTY WITH BILATERAL TURBINATE REDUCTION;  Surgeon: Rozetta Nunnery, MD;  Location: [facility];  Service: ENT;  Laterality: Bilateral;  . UVULECTOMY N/A 01/30/2016   Procedure: UVULECTOMY;  Surgeon: Rozetta Nunnery, MD;  Location: [facility] OR;  Service: ENT;  Laterality: N/A;    SOCIAL HISTORY: Social History   Socioeconomic History  . Marital status: Married    Spouse name: [name]   . Number of children: Not on file  . Years of education: 53  . Highest education level: Not on file  Occupational History  . Occupation: Retired  Scientific laboratory technician  . Financial resource strain: Not on file  . Food insecurity    Worry: Not on file    Inability: Not on file  . Transportation needs    Medical: Not on file    Non-medical: Not on file  Tobacco Use  . Smoking status: Former Smoker    Packs/day: 2.00    Years: 25.00    Pack years: 50.00    Types: Cigarettes    Quit date: 05/20/1983    Years since quitting: 35.7  . Smokeless tobacco: Never Used  Substance and Sexual Activity  . Alcohol use: Not Currently    Alcohol/week: 0.0 standard drinks  . Drug use: No  . Sexual activity: Not on file  Lifestyle  . Physical activity    Days per week: Not on file    Minutes per  session: Not on file  . Stress: Not on file  Relationships  . Social Herbalist on phone: Not on file    Gets together: Not on file    Attends religious service: Not on file    Active member of club or organization: Not on file    Attends meetings of clubs or organizations: Not on file    Relationship status: Not on file  . Intimate partner violence    Fear of current or ex partner: Not on file    Emotionally abused: Not on file    Physically abused: Not  on file    Forced sexual activity: Not on file  Other Topics Concern  . Not on file  Social History Narrative   Lives with wife, [name]   Caffeine use: daily (Coffee)    FAMILY HISTORY: Family History  Problem Relation Age of Onset  . Alzheimer's disease Mother   . Mental illness Mother   . Depression Mother   . Heart disease Father 35       had 5 MI's & died 76 yo  . Alcohol abuse Son   . Stroke Maternal Grandmother     ALLERGIES:  is allergic to other.  MEDICATIONS:  Current Outpatient Medications  Medication Sig Dispense Refill  . acetaminophen (TYLENOL) 500 MG tablet Take 500 mg by mouth every 6 (six) hours as needed for mild pain.     Marland Kitchen albuterol (PROVENTIL HFA;VENTOLIN HFA) 108 (90 Base) MCG/ACT inhaler Inhale 2 puffs into the lungs every 6 (six) hours as needed for wheezing or shortness of breath. (Patient not taking: Reported on 01/10/2019) 1 Inhaler 3  . albuterol (PROVENTIL) (2.5 MG/3ML) 0.083% nebulizer solution USE 1 VIAL IN NEBULIZER EVERY 6 HOURS AS NEEDED FOR WHEEZING OR SHORTNESS OF BREATH 75 mL 0  . allopurinol (ZYLOPRIM) 300 MG tablet Take 1 tablet (300 mg total) by mouth daily. 90 tablet 3  . aspirin EC 81 MG tablet Take 81 mg by mouth daily.    Marland Kitchen atorvastatin (LIPITOR) 40 MG tablet Take 1 tablet (40 mg total) by mouth daily. 90 tablet 3  . Black Pepper-Turmeric (TURMERIC COMPLEX/BLACK PEPPER PO) Take 1,000 mg by mouth daily.     . Cholecalciferol (VITAMIN D PO) Take 15,000 Units by mouth daily.      . diphenhydrAMINE (BENADRYL) 25 MG tablet Take 25 mg by mouth at bedtime.    . docusate sodium (COLACE) 100 MG capsule Take 100 mg by mouth 2 (two) times daily. 1 tablet in the morning and 1 tablet at bedtime    . doxazosin (CARDURA) 4 MG tablet Take 1 tablet (4 mg total) by mouth daily. 90 tablet 3  . finasteride (PROSCAR) 5 MG tablet Take 1 tablet (5 mg total) by mouth daily. 90 tablet 3  . fluticasone (FLONASE) 50 MCG/ACT nasal spray Place 2 sprays into both nostrils at bedtime. PRN (Patient not taking: Reported on 01/10/2019) 16 g 2  . furosemide (LASIX) 40 MG tablet Take 1 tablet (40 mg total) by mouth daily. 90 tablet 3  . hydroxyurea (HYDREA) 500 MG capsule 1000 mg (2 caps) po daily .Take with food. 120 capsule 3  . levothyroxine (SYNTHROID, LEVOTHROID) 200 MCG tablet TAKE 1 TABLET EVERY DAY BEFORE BREAKFAST EXCEPT Sunday TAKE 1.5 TABS. 96 tablet 3  . losartan (COZAAR) 50 MG tablet TAKE 1 TABLET EVERY DAY 90 tablet 3  . Magnesium 400 MG TABS Take 1 tablet by mouth daily.    . metoprolol succinate (TOPROL-XL) 50 MG 24 hr tablet TAKE 1 TABLET EVERY DAY WITH FOOD  OR  IMMEDIATELY FOLLOWING A MEAL (Patient taking differently: 100 mg. ) 90 tablet 3  . omeprazole (PRILOSEC) 20 MG capsule TAKE 1 CAPSULE TWICE DAILY 180 capsule 1  . OVER THE COUNTER MEDICATION daily. Vitamin A to Z 50 plus    . polyethylene glycol (MIRALAX / GLYCOLAX) packet Take 17 g by mouth daily.    Marland Kitchen spironolactone (ALDACTONE) 25 MG tablet Take 1 tablet Daily for Heart & BP 90 tablet 3  . warfarin (COUMADIN) 5 MG tablet TAKE 1 AND 1/2  TABLETS EVERY DAY  OR AS DIRECTED 145 tablet 1  . zinc gluconate 50 MG tablet Take 50 mg by mouth daily.     No current facility-administered medications for this visit.     REVIEW OF SYSTEMS:    A 10+ POINT REVIEW OF SYSTEMS WAS OBTAINED including neurology, dermatology, psychiatry, cardiac, respiratory, lymph, extremities, GI, GU, Musculoskeletal, constitutional, breasts, reproductive,  HEENT.  All pertinent positives are noted in the HPI.  All others are negative.   PHYSICAL EXAMINATION: . Vitals:   02/23/19 1156  BP: 138/70  Pulse: 72  Resp: 17  Temp: 98.3 F (36.8 C)  SpO2: 95%   Filed Weights   02/23/19 1156  Weight: 268 lb 11.2 oz (121.9 kg)   .Body mass index is 39.68 kg/m.   GENERAL:alert, in no acute distress and comfortable SKIN: no acute rashes, no significant lesions EYES: conjunctiva are pink and non-injected, sclera anicteric OROPHARYNX: MMM, no exudates, no oropharyngeal erythema or ulceration NECK: supple, no JVD LYMPH:  no palpable lymphadenopathy in the cervical, axillary or inguinal regions LUNGS: clear to auscultation b/l with normal respiratory effort HEART: regular rate & rhythm ABDOMEN:  normoactive bowel sounds , non tender, not distended. No palpable hepatosplenomegaly.  Extremity: 1+ pedal edema PSYCH: alert & oriented x 3 with fluent speech NEURO: no focal motor/sensory deficits  LABORATORY DATA:  I have reviewed the data as listed  . CBC Latest Ref Rng & Units 02/23/2019 01/10/2019 12/21/2018  WBC 4.0 - 10.5 K/uL 16.5(H) 15.8(H) 16.7(H)  Hemoglobin 13.0 - 17.0 g/dL 15.6 15.5 16.3  Hematocrit 39.0 - 52.0 % 50.2 47.2 52.9(H)  Platelets 150 - 400 K/uL 412(H) 375 416(H)    . CMP Latest Ref Rng & Units 02/23/2019 01/10/2019 12/21/2018  Glucose 70 - 99 mg/dL 135(H) 137(H) 106(H)  BUN 8 - 23 mg/dL 19 20 19   Creatinine 0.61 - 1.24 mg/dL 1.17 1.12 1.20  Sodium 135 - 145 mmol/L 139 137 137  Potassium 3.5 - 5.1 mmol/L 4.1 4.6 4.7  Chloride 98 - 111 mmol/L 101 100 101  CO2 22 - 32 mmol/L 29 29 28   Calcium 8.9 - 10.3 mg/dL 9.3 9.7 9.5  Total Protein 6.5 - 8.1 g/dL 6.8 6.4 6.9  Total Bilirubin 0.3 - 1.2 mg/dL 1.0 0.9 1.0  Alkaline Phos 38 - 126 U/L 75 - 73  AST 15 - 41 U/L 24 24 28   ALT 0 - 44 U/L 25 20 24    . Lab Results  Component Value Date   LDH 442 (H) 12/21/2018    02/09/18 JAK2 Mutation Study:    02/09/18 BCR ABL FISH:     RADIOGRAPHIC STUDIES: I have personally reviewed the radiological images as listed and agreed with the findings in the report. No results found.  ASSESSMENT & PLAN:   78 y.o. male with  1. 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 Some associated leukocytosis. No history of splenectomy. No previous h/o VTE, but significant cardiac risk factors. 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   02/09/18 BCR ABL FISH revealed no evidence of BCR/ABL rearrangement   PLAN: -Discussed pt labwork today, 02/23/19; PLTs are okay still at little elevated at 412k, HGB okay, mild Neutrophilia and hemoconcentration.  -Recommended sun protection strategies as Hydroxyurea  can increase sensitivity of skin, and that he should watch for mouth sores as well  -Goal for PLT between 200k and 400k -Avoid iron supplements -Will hold phlebotomy today -Increase hydroxyurea dosage to 1000mg  Hydroxyurea 7 days/week -Will see the pt back in 2 months with labs  2. . Patient Active Problem List   Diagnosis Date Noted  . Memory changes 01/10/2019  . Polycythemia vera ([place]) 10/21/2018  . Thrombocytosis ([place]) 12/29/2017  . Senile purpura ([place]) 09/01/2017  . Emphysema of lung ([place]) 06/01/2017  . Tortuous aorta ([facility]) 06/01/2017  . ASHD/CABG x 3V (11/2013) 02/06/2015  . Encounter for monitoring Coumadin therapy 02/06/2015  . Cardiac pacemaker/AICD (01/2014) 02/06/2015  . Morbid obesity ([place]) 01/03/2015  . HTN (hypertension) 01/02/2015  . Mixed hyperlipidemia 01/02/2015  . Prediabetes 01/02/2015  . Vitamin D deficiency 01/02/2015  . GERD  01/02/2015  . BPH (benign prostatic hyperplasia) 01/02/2015  . Medication management 01/02/2015  . Atrial  fibrillation ([place]) 01/02/2015  . Hypothyroidism 01/02/2015  . OSA (obstructive sleep apnea) 01/02/2015  -continue f/u with PCP to optimize atherosclerotic risk factors.   FOLLOW UP: RTC with Dr Irene Limbo with labs and therapeutic phlebotomy in 2 months  The total time spent in the appt was 15 minutes and more than 50% was on counseling and direct patient cares.  All of the patient's questions were answered with apparent satisfaction. The patient knows to call the clinic with any problems, questions or concerns.  Sullivan Lone MD [place] AAHIVMS [facility] Hematology/Oncology Physician [facility]  (Office):       [phone] (Work cell):  [phone] (Fax):           [phone]  02/23/2019 12:46 PM  I, Yevette Edwards, am acting as a scribe for Dr. Sullivan Lone.   .I have reviewed the above documentation for accuracy and completeness, and I agree with the above. Brunetta Genera MD

## 2019-02-21 DIAGNOSIS — I480 Paroxysmal atrial fibrillation: Secondary | ICD-10-CM | POA: Diagnosis not present

## 2019-02-21 DIAGNOSIS — Z95810 Presence of automatic (implantable) cardiac defibrillator: Secondary | ICD-10-CM | POA: Diagnosis not present

## 2019-02-21 DIAGNOSIS — I2510 Atherosclerotic heart disease of native coronary artery without angina pectoris: Secondary | ICD-10-CM | POA: Diagnosis not present

## 2019-02-21 DIAGNOSIS — R0600 Dyspnea, unspecified: Secondary | ICD-10-CM | POA: Diagnosis not present

## 2019-02-22 DIAGNOSIS — H43392 Other vitreous opacities, left eye: Secondary | ICD-10-CM | POA: Diagnosis not present

## 2019-02-22 DIAGNOSIS — H26492 Other secondary cataract, left eye: Secondary | ICD-10-CM | POA: Diagnosis not present

## 2019-02-23 ENCOUNTER — Inpatient Hospital Stay: Payer: Medicare Other | Attending: Hematology | Admitting: Hematology

## 2019-02-23 ENCOUNTER — Inpatient Hospital Stay: Payer: Medicare Other

## 2019-02-23 ENCOUNTER — Other Ambulatory Visit: Payer: Self-pay

## 2019-02-23 VITALS — BP 138/70 | HR 72 | Temp 98.3°F | Resp 17 | Ht 69.0 in | Wt 268.7 lb

## 2019-02-23 DIAGNOSIS — Z7982 Long term (current) use of aspirin: Secondary | ICD-10-CM | POA: Diagnosis not present

## 2019-02-23 DIAGNOSIS — I4891 Unspecified atrial fibrillation: Secondary | ICD-10-CM | POA: Diagnosis not present

## 2019-02-23 DIAGNOSIS — I2510 Atherosclerotic heart disease of native coronary artery without angina pectoris: Secondary | ICD-10-CM

## 2019-02-23 DIAGNOSIS — Z7901 Long term (current) use of anticoagulants: Secondary | ICD-10-CM | POA: Diagnosis not present

## 2019-02-23 DIAGNOSIS — Z79899 Other long term (current) drug therapy: Secondary | ICD-10-CM | POA: Insufficient documentation

## 2019-02-23 DIAGNOSIS — D72829 Elevated white blood cell count, unspecified: Secondary | ICD-10-CM | POA: Insufficient documentation

## 2019-02-23 DIAGNOSIS — D45 Polycythemia vera: Secondary | ICD-10-CM | POA: Diagnosis not present

## 2019-02-23 LAB — CBC WITH DIFFERENTIAL/PLATELET
Abs Immature Granulocytes: 0.19 10*3/uL — ABNORMAL HIGH (ref 0.00–0.07)
Basophils Absolute: 0.2 10*3/uL — ABNORMAL HIGH (ref 0.0–0.1)
Basophils Relative: 1 %
Eosinophils Absolute: 0.1 10*3/uL (ref 0.0–0.5)
Eosinophils Relative: 1 %
HCT: 50.2 % (ref 39.0–52.0)
Hemoglobin: 15.6 g/dL (ref 13.0–17.0)
Immature Granulocytes: 1 %
Lymphocytes Relative: 10 %
Lymphs Abs: 1.6 10*3/uL (ref 0.7–4.0)
MCH: 30.1 pg (ref 26.0–34.0)
MCHC: 31.1 g/dL (ref 30.0–36.0)
MCV: 96.9 fL (ref 80.0–100.0)
Monocytes Absolute: 0.5 10*3/uL (ref 0.1–1.0)
Monocytes Relative: 3 %
Neutro Abs: 13.8 10*3/uL — ABNORMAL HIGH (ref 1.7–7.7)
Neutrophils Relative %: 84 %
Platelets: 412 10*3/uL — ABNORMAL HIGH (ref 150–400)
RBC: 5.18 MIL/uL (ref 4.22–5.81)
RDW: 17 % — ABNORMAL HIGH (ref 11.5–15.5)
WBC: 16.5 10*3/uL — ABNORMAL HIGH (ref 4.0–10.5)
nRBC: 0 % (ref 0.0–0.2)

## 2019-02-23 LAB — CMP (CANCER CENTER ONLY)
ALT: 25 U/L (ref 0–44)
AST: 24 U/L (ref 15–41)
Albumin: 4.1 g/dL (ref 3.5–5.0)
Alkaline Phosphatase: 75 U/L (ref 38–126)
Anion gap: 9 (ref 5–15)
BUN: 19 mg/dL (ref 8–23)
CO2: 29 mmol/L (ref 22–32)
Calcium: 9.3 mg/dL (ref 8.9–10.3)
Chloride: 101 mmol/L (ref 98–111)
Creatinine: 1.17 mg/dL (ref 0.61–1.24)
GFR, Est AFR Am: >60 mL/min (ref >60)
GFR, Estimated: 59 mL/min — ABNORMAL LOW (ref >60)
Glucose, Bld: 135 mg/dL — ABNORMAL HIGH (ref 70–99)
Potassium: 4.1 mmol/L (ref 3.5–5.1)
Sodium: 139 mmol/L (ref 135–145)
Total Bilirubin: 1 mg/dL (ref 0.3–1.2)
Total Protein: 6.8 g/dL (ref 6.5–8.1)

## 2019-02-23 MED ORDER — HYDROXYUREA 500 MG PO CAPS: ORAL_CAPSULE | ORAL | 3 refills | Status: DC

## 2019-02-24 ENCOUNTER — Other Ambulatory Visit: Payer: Self-pay | Admitting: Internal Medicine

## 2019-02-24 ENCOUNTER — Telehealth: Payer: Self-pay | Admitting: Hematology

## 2019-02-24 NOTE — Telephone Encounter (Signed)
Scheduled appt per 10/7 los.  Sent a staff message and a calendar will be mailed out.

## 2019-03-09 DIAGNOSIS — Z95810 Presence of automatic (implantable) cardiac defibrillator: Secondary | ICD-10-CM | POA: Diagnosis not present

## 2019-03-09 DIAGNOSIS — Z7901 Long term (current) use of anticoagulants: Secondary | ICD-10-CM | POA: Diagnosis not present

## 2019-03-09 DIAGNOSIS — I255 Ischemic cardiomyopathy: Secondary | ICD-10-CM | POA: Diagnosis not present

## 2019-03-09 DIAGNOSIS — I480 Paroxysmal atrial fibrillation: Secondary | ICD-10-CM | POA: Diagnosis not present

## 2019-03-10 DIAGNOSIS — H26492 Other secondary cataract, left eye: Secondary | ICD-10-CM | POA: Diagnosis not present

## 2019-03-10 DIAGNOSIS — H26493 Other secondary cataract, bilateral: Secondary | ICD-10-CM | POA: Diagnosis not present

## 2019-03-25 DIAGNOSIS — H43392 Other vitreous opacities, left eye: Secondary | ICD-10-CM | POA: Diagnosis not present

## 2019-03-25 DIAGNOSIS — H26492 Other secondary cataract, left eye: Secondary | ICD-10-CM | POA: Diagnosis not present

## 2019-03-29 DIAGNOSIS — I255 Ischemic cardiomyopathy: Secondary | ICD-10-CM | POA: Diagnosis not present

## 2019-03-29 DIAGNOSIS — I5022 Chronic systolic (congestive) heart failure: Secondary | ICD-10-CM | POA: Diagnosis not present

## 2019-03-29 DIAGNOSIS — I10 Essential (primary) hypertension: Secondary | ICD-10-CM | POA: Diagnosis not present

## 2019-03-29 DIAGNOSIS — I480 Paroxysmal atrial fibrillation: Secondary | ICD-10-CM | POA: Diagnosis not present

## 2019-03-29 DIAGNOSIS — I2510 Atherosclerotic heart disease of native coronary artery without angina pectoris: Secondary | ICD-10-CM | POA: Diagnosis not present

## 2019-03-29 DIAGNOSIS — Z951 Presence of aortocoronary bypass graft: Secondary | ICD-10-CM | POA: Diagnosis not present

## 2019-03-30 DIAGNOSIS — I480 Paroxysmal atrial fibrillation: Secondary | ICD-10-CM | POA: Diagnosis not present

## 2019-03-30 DIAGNOSIS — I255 Ischemic cardiomyopathy: Secondary | ICD-10-CM | POA: Diagnosis not present

## 2019-03-30 DIAGNOSIS — Z7901 Long term (current) use of anticoagulants: Secondary | ICD-10-CM | POA: Diagnosis not present

## 2019-03-30 DIAGNOSIS — Z95810 Presence of automatic (implantable) cardiac defibrillator: Secondary | ICD-10-CM | POA: Diagnosis not present

## 2019-04-08 ENCOUNTER — Other Ambulatory Visit: Payer: Self-pay | Admitting: Internal Medicine

## 2019-04-08 DIAGNOSIS — I480 Paroxysmal atrial fibrillation: Secondary | ICD-10-CM

## 2019-04-08 MED ORDER — WARFARIN SODIUM 5 MG PO TABS: ORAL_TABLET | ORAL | 3 refills | Status: DC

## 2019-04-08 NOTE — Progress Notes (Signed)
g

## 2019-04-11 ENCOUNTER — Encounter: Payer: Self-pay | Admitting: Internal Medicine

## 2019-04-11 ENCOUNTER — Other Ambulatory Visit: Payer: Self-pay | Admitting: Internal Medicine

## 2019-04-11 DIAGNOSIS — I480 Paroxysmal atrial fibrillation: Secondary | ICD-10-CM

## 2019-04-11 MED ORDER — WARFARIN SODIUM 5 MG PO TABS: ORAL_TABLET | ORAL | 3 refills | Status: DC

## 2019-04-11 NOTE — Progress Notes (Signed)
Annual  Screening/Preventative Visit  & Comprehensive Evaluation & Examination     This very nice 78 y.o. MWM  presents for a Screening /Preventative Visit & comprehensive evaluation and management of multiple medical co-morbidities.  Patient has been followed for HTN, ASHD/CABG/pAFib, HLD, Prediabetes and Vitamin D Deficiency.  Patient's GERD is controlled on his Omeprazole. Patient has hx/o Gout controlled on Allopurinol. Patient is on Hydrea for Thrombocythemia followed by Dr Irene Limbo.  Patient has OSA, but is intolerant to the CPAP mask.      HTN predates circa 77 (age 71 yo). Patient's BP has been controlled at home.  Today's BP is at goal - 118/80. In 2015, patient underwent CABG x 3 V and had po Afib, failing CV and then had a AICD implanted. He remains on Coumadin followed at his Cardiologist's office.  Patient denies any cardiac symptoms as chest pain, palpitations, shortness of breath, dizziness or ankle swelling.     Patient's hyperlipidemia is controlled with diet and Atorvastatin. Patient denies myalgias or other medication SE's. Last lipids were at goal:  Lab Results  Component Value Date   CHOL 126 01/10/2019   HDL 21 (L) 01/10/2019   LDLCALC 80 01/10/2019   TRIG 153 (H) 01/10/2019   CHOLHDL 6.0 (H) 01/10/2019      Patient has Morbid Obesity (BMI 39+) and hx/o prediabetes (A1c 6.0% / 2015)  and patient denies reactive hypoglycemic symptoms, visual blurring, diabetic polys or paresthesias. Last A1c was not at goal:  Lab Results  Component Value Date   HGBA1C 5.8 (H) 09/29/2018       Finally, patient has history of Vitamin D Deficiency ("39" / 2016) and last vitamin D was at goal:  Lab Results  Component Value Date   VD25OH 44 09/29/2018   Current Outpatient Medications on File Prior to Visit  Medication Sig  . acetaminophen (TYLENOL) 500 MG tablet Take 500 mg by mouth every 6 (six) hours as needed for mild pain.   Marland Kitchen albuterol (PROVENTIL HFA;VENTOLIN HFA) 108 (90 Base)  MCG/ACT inhaler Inhale 2 puffs into the lungs every 6 (six) hours as needed for wheezing or shortness of breath.  Marland Kitchen albuterol (PROVENTIL) (2.5 MG/3ML) 0.083% nebulizer solution USE 1 VIAL IN NEBULIZER EVERY 6 HOURS AS NEEDED FOR WHEEZING OR SHORTNESS OF BREATH  . allopurinol (ZYLOPRIM) 300 MG tablet Take 1 tablet Daily to prevent Gout  . aspirin EC 81 MG tablet Take 81 mg by mouth daily.  Marland Kitchen atorvastatin (LIPITOR) 40 MG tablet Take 1 tablet Daily for Cholesterol  . Black Pepper-Turmeric (TURMERIC COMPLEX/BLACK PEPPER PO) Take 1,000 mg by mouth daily.   . Cholecalciferol (VITAMIN D PO) Take 20,000 Units by mouth daily.   . diphenhydrAMINE (BENADRYL) 25 MG tablet Take 25 mg by mouth at bedtime.  . docusate sodium (COLACE) 100 MG capsule Take 100 mg by mouth 2 (two) times daily. 1 tablet in the morning and 1 tablet at bedtime  . doxazosin (CARDURA) 4 MG tablet TAKE 1 TABLET EVERY DAY  . finasteride (PROSCAR) 5 MG tablet Take 1 tablet Daily for Prostate  . fluticasone (FLONASE) 50 MCG/ACT nasal spray Place 2 sprays into both nostrils at bedtime. PRN  . furosemide (LASIX) 40 MG tablet Take 1 tablet (40 mg total) by mouth daily. (Patient taking differently: Take 40 mg by mouth 2 (two) times daily. )  . hydroxyurea (HYDREA) 500 MG capsule 1000 mg (2 caps) po daily .Take with food.  Marland Kitchen levothyroxine (SYNTHROID) 200 MCG tablet Take 1  tablet daily on an empty stomach with only water for 30 minutes & no Antacid meds, Calcium or Magnesium for 4 hours & avoid Biotin  . losartan (COZAAR) 100 MG tablet Take 100 mg by mouth daily.  . Magnesium 400 MG TABS Take 1 tablet by mouth daily.  . metoprolol (TOPROL-XL) 200 MG 24 hr tablet Take 200 mg by mouth daily.  Marland Kitchen omeprazole (PRILOSEC) 20 MG capsule TAKE 1 CAPSULE TWICE DAILY  . OVER THE COUNTER MEDICATION daily. Vitamin A to Z 50 plus  . OVER THE COUNTER MEDICATION DHA 500 mg capsule daily.  . polyethylene glycol (MIRALAX / GLYCOLAX) packet Take 17 g by mouth daily.   Marland Kitchen spironolactone (ALDACTONE) 25 MG tablet Take 1 tablet Daily for Heart & BP  . warfarin (COUMADIN) 5 MG tablet Take 1 to 2 tablets Daily as Directed  . zinc gluconate 50 MG tablet Take 50 mg by mouth daily.   No current facility-administered medications on file prior to visit.    Allergies  Allergen Reactions  . Other Swelling    SODIUM SENSITIVE   Past Medical History:  Diagnosis Date  . A-fib ([place]) 01/2014  . AICD (automatic cardioverter/defibrillator) present    [employer]  . Arthritis   . ASHD (arteriosclerotic heart disease) 2015   s/p CABG  . Cataract    s/p left  . COPD (chronic obstructive pulmonary disease) ([place])    by CXR, pt unsure of this  . GERD (gastroesophageal reflux disease)   . Gout 2014  . Hyperlipidemia   . Hypertension   . Hypothyroidism   . LBBB (left bundle branch block)   . Pre-diabetes   . Prediabetes 01/2014  . Presence of permanent cardiac pacemaker   . Sleep apnea    no CPAP  . Thyroid disease    hypothyroid   Health Maintenance  Topic Date Due  . INFLUENZA VACCINE  12/18/2018  . TETANUS/TDAP  06/30/2019 (Originally 09/25/1959)  . PNA vac Low Risk Adult  Completed   Immunization History  Administered Date(s) Administered  . Influenza, High Dose Seasonal PF 04/23/2015, 02/18/2017, 03/29/2018, 02/12/2019  . Influenza,inj,Quad PF,6+ Mos 01/31/2016  . Pneumococcal Conjugate-13 11/21/2014  . Pneumococcal Polysaccharide-23 06/01/2017   Last Colon - 11/11/2006 - Dr Fuller Plan recc f/u &  sent a recall letter in 2018   Cologard 11/06/2016 Negative - & recc a 3 year f/u due June 2021.  Past Surgical History:  Procedure Laterality Date  . CARDIAC DEFIBRILLATOR PLACEMENT  07/2014  . CATARACT EXTRACTION W/ INTRAOCULAR LENS IMPLANT Left   . CORONARY ARTERY BYPASS GRAFT  11/2013  . lumb laminectomy  [phone]   x 3  . LUMBAR FUSION  2013  . NASAL SEPTOPLASTY W/ TURBINOPLASTY Bilateral 01/30/2016   Procedure: NASAL SEPTOPLASTY WITH  BILATERAL TURBINATE REDUCTION;  Surgeon: Rozetta Nunnery, MD;  Location: [facility];  Service: ENT;  Laterality: Bilateral;  . UVULECTOMY N/A 01/30/2016   Procedure: UVULECTOMY;  Surgeon: Rozetta Nunnery, MD;  Location: [facility] OR;  Service: ENT;  Laterality: N/A;   Family History  Problem Relation Age of Onset  . Alzheimer's disease Mother   . Mental illness Mother   . Depression Mother   . Heart disease Father 61       had 5 MI's & died 58 yo  . Alcohol abuse Son   . Stroke Maternal Grandmother    Social History   Socioeconomic History  . Marital status: Married    Spouse name: [name]   .  Number of children: 2 sons  . Years of education: 32  . Highest education level: Not on file  Occupational History  . Occupation: Retired  Tobacco Use  . Smoking status: Former Smoker    Packs/day: 2.00    Years: 25.00    Pack years: 50.00    Types: Cigarettes    Quit date: 05/20/1983    Years since quitting: 35.9  . Smokeless tobacco: Never Used  Substance and Sexual Activity  . Alcohol use: Not Currently    Alcohol/week: 0.0 standard drinks  . Drug use: No  . Sexual activity: Not on file  Social History Narrative   Lives with wife, [name]   Caffeine use: daily (Coffee)    ROS Constitutional: Denies fever, chills, weight loss/gain, headaches, insomnia,  night sweats or change in appetite. Does c/o fatigue. Eyes: Denies redness, blurred vision, diplopia, discharge, itchy or watery eyes.  ENT: Denies discharge, congestion, post nasal drip, epistaxis, sore throat, earache, hearing loss, dental pain, Tinnitus, Vertigo, Sinus pain or snoring.  Cardio: Denies chest pain, palpitations, irregular heartbeat, syncope, dyspnea, diaphoresis, orthopnea, PND, claudication or edema Respiratory: denies cough, dyspnea, DOE, pleurisy, hoarseness, laryngitis or wheezing.  Gastrointestinal: Denies dysphagia, heartburn, reflux, water brash, pain, cramps, nausea, vomiting, bloating, diarrhea, constipation,  hematemesis, melena, hematochezia, jaundice or hemorrhoids Genitourinary: Denies dysuria, frequency, urgency, nocturia, hesitancy, discharge, hematuria or flank pain Musculoskeletal: Denies arthralgia, myalgia, stiffness, Jt. Swelling, pain, limp or strain/sprain. Denies Falls. Skin: Denies puritis, rash, hives, warts, acne, eczema or change in skin lesion Neuro: No weakness, tremor, incoordination, spasms, paresthesia or pain Psychiatric: Denies confusion, memory loss or sensory loss. Denies Depression. Endocrine: Denies change in weight, skin, hair change, nocturia, and paresthesia, diabetic polys, visual blurring or hyper / hypo glycemic episodes.  Heme/Lymph: No excessive bleeding, bruising or enlarged lymph nodes.  Physical Exam  BP 118/80   Pulse 76   Temp (!) 97.3 F (36.3 C)   Resp 18   Ht 5\' 9"  (1.753 m)   Wt 266 lb 9.6 oz (120.9 kg)   BMI 39.37 kg/m   General Appearance: Well nourished and well groomed and in no apparent distress.  Eyes: PERRLA, EOMs, conjunctiva no swelling or erythema, normal fundi and vessels. Sinuses: No frontal/maxillary tenderness ENT/Mouth: EACs patent / TMs  nl. Nares clear without erythema, swelling, mucoid exudates. Oral hygiene is good. No erythema, swelling, or exudate. Tongue normal, non-obstructing. Tonsils not swollen or erythematous. Hearing normal.  Neck: Supple, thyroid not palpable. No bruits, nodes or JVD. Respiratory: Respiratory effort normal.  BS equal and clear bilateral without rales, rhonci, wheezing or stridor. Cardio: Heart sounds are normal with regular rate and rhythm and no murmurs, rubs or gallops. Peripheral pulses are normal and equal bilaterally without edema. No aortic or femoral bruits. Chest: symmetric with normal excursions and percussion.  Abdomen: Soft, with Nl bowel sounds. Nontender, no guarding, rebound, hernias, masses, or organomegaly.  Lymphatics: Non tender without lymphadenopathy.  Musculoskeletal: Full ROM  all peripheral extremities, joint stability, 5/5 strength, and normal gait. Skin: Warm and dry without rashes, lesions, cyanosis, clubbing or  ecchymosis.  Neuro: Cranial nerves intact, reflexes equal bilaterally. Normal muscle tone, no cerebellar symptoms. Sensation intact.  Pysch: Alert and oriented X 3 with normal affect, insight and judgment appropriate.   Assessment and Plan  1. Essential hypertension  - EKG 12-Lead - [place], retroperitnl abd,  ltd - Urinalysis, Routine w reflex microscopic - Microalbumin / Creatinine Urine Ratio - CBC with Diff - COMPLETE METABOLIC PANEL  WITH GFR - Magnesium - TSH  2. Hyperlipidemia, mixed  - EKG 12-Lead - [place], retroperitnl abd,  ltd - Lipid Profile - TSH  3. Abnormal glucose  - EKG 12-Lead - Hemoglobin A1c ([facility]) - Insulin, random  4. Vitamin D deficiency  - Vitamin D (25 hydroxy)  5. Prediabetes  - EKG 12-Lead - [place], retroperitnl abd,  ltd - Hemoglobin A1c ([facility]) - Insulin, random  6. ASHD/CABG x 3V (11/2013)  - EKG 12-Lead  7. Paroxysmal atrial fibrillation ([facility])  - EKG 12-Lead  8. Idiopathic gout  - Uric acid  9. Cardiac pacemaker/AICD (01/2014)  10. Hypothyroidism, unspecified type  - TSH  11. OSA (obstructive sleep apnea)  12. Screening for colorectal cancer  - POC Hemoccult Bld/Stl  13. Screening for ischemic heart disease  - EKG 12-Lead  14. BPH with obstruction/lower urinary tract symptoms  - PSA  15. Prostate cancer screening  - PSA  16. FHx: heart disease  - EKG 12-Lead - [place], retroperitnl abd,  ltd  17. Former smoker  - EKG 12-Lead - [place], retroperitnl abd,  ltd  89. Abdominal aortic aneurysm (AAA) without rupture ([facility])  - [place], retroperitnl abd,  ltd  19. Screening for AAA (aortic abdominal aneurysm)  - [place], retroperitnl abd,  ltd  20. Medication management  - Urinalysis, Routine w reflex microscopic - Microalbumin / Creatinine Urine Ratio - CBC with Diff - COMPLETE  METABOLIC PANEL WITH GFR - Magnesium - Lipid Profile - TSH - Hemoglobin A1c ([facility]) - Insulin, random - Vitamin D (25 hydroxy)       Patient was counseled in prudent diet, weight control to achieve/maintain BMI less than 25, BP monitoring, regular exercise and medications as discussed.  Discussed med effects and SE's. Routine screening labs and tests as requested with regular follow-up as recommended. Over 40 minutes of exam, counseling, chart review and high complex critical decision making was performed   Kirtland Bouchard, MD

## 2019-04-11 NOTE — Patient Instructions (Signed)

## 2019-04-12 ENCOUNTER — Other Ambulatory Visit: Payer: Self-pay

## 2019-04-12 ENCOUNTER — Encounter: Payer: Self-pay | Admitting: Internal Medicine

## 2019-04-12 ENCOUNTER — Ambulatory Visit (INDEPENDENT_AMBULATORY_CARE_PROVIDER_SITE_OTHER): Payer: Medicare Other | Admitting: Internal Medicine

## 2019-04-12 VITALS — BP 118/80 | HR 76 | Temp 97.3°F | Resp 18 | Ht 69.0 in | Wt 266.6 lb

## 2019-04-12 DIAGNOSIS — E559 Vitamin D deficiency, unspecified: Secondary | ICD-10-CM | POA: Diagnosis not present

## 2019-04-12 DIAGNOSIS — G4733 Obstructive sleep apnea (adult) (pediatric): Secondary | ICD-10-CM

## 2019-04-12 DIAGNOSIS — I2510 Atherosclerotic heart disease of native coronary artery without angina pectoris: Secondary | ICD-10-CM

## 2019-04-12 DIAGNOSIS — E039 Hypothyroidism, unspecified: Secondary | ICD-10-CM

## 2019-04-12 DIAGNOSIS — N138 Other obstructive and reflux uropathy: Secondary | ICD-10-CM | POA: Diagnosis not present

## 2019-04-12 DIAGNOSIS — Z136 Encounter for screening for cardiovascular disorders: Secondary | ICD-10-CM

## 2019-04-12 DIAGNOSIS — M1000 Idiopathic gout, unspecified site: Secondary | ICD-10-CM | POA: Diagnosis not present

## 2019-04-12 DIAGNOSIS — I480 Paroxysmal atrial fibrillation: Secondary | ICD-10-CM | POA: Diagnosis not present

## 2019-04-12 DIAGNOSIS — Z8249 Family history of ischemic heart disease and other diseases of the circulatory system: Secondary | ICD-10-CM | POA: Diagnosis not present

## 2019-04-12 DIAGNOSIS — Z1211 Encounter for screening for malignant neoplasm of colon: Secondary | ICD-10-CM

## 2019-04-12 DIAGNOSIS — N401 Enlarged prostate with lower urinary tract symptoms: Secondary | ICD-10-CM | POA: Diagnosis not present

## 2019-04-12 DIAGNOSIS — Z79899 Other long term (current) drug therapy: Secondary | ICD-10-CM | POA: Diagnosis not present

## 2019-04-12 DIAGNOSIS — E782 Mixed hyperlipidemia: Secondary | ICD-10-CM | POA: Diagnosis not present

## 2019-04-12 DIAGNOSIS — I714 Abdominal aortic aneurysm, without rupture, unspecified: Secondary | ICD-10-CM

## 2019-04-12 DIAGNOSIS — R7303 Prediabetes: Secondary | ICD-10-CM | POA: Diagnosis not present

## 2019-04-12 DIAGNOSIS — I10 Essential (primary) hypertension: Secondary | ICD-10-CM

## 2019-04-12 DIAGNOSIS — Z87891 Personal history of nicotine dependence: Secondary | ICD-10-CM | POA: Diagnosis not present

## 2019-04-12 DIAGNOSIS — R7309 Other abnormal glucose: Secondary | ICD-10-CM

## 2019-04-12 DIAGNOSIS — Z125 Encounter for screening for malignant neoplasm of prostate: Secondary | ICD-10-CM

## 2019-04-12 DIAGNOSIS — Z950 Presence of cardiac pacemaker: Secondary | ICD-10-CM

## 2019-04-12 DIAGNOSIS — Z1212 Encounter for screening for malignant neoplasm of rectum: Secondary | ICD-10-CM

## 2019-04-13 LAB — CBC WITH DIFFERENTIAL/PLATELET
Absolute Monocytes: 564 cells/uL (ref 200–950)
Basophils Absolute: 171 cells/uL (ref 0–200)
Basophils Relative: 1 %
Eosinophils Absolute: 137 cells/uL (ref 15–500)
Eosinophils Relative: 0.8 %
HCT: 49.2 % (ref 38.5–50.0)
Hemoglobin: 16.2 g/dL (ref 13.2–17.1)
Lymphs Abs: 1625 cells/uL (ref 850–3900)
MCH: 29.8 pg (ref 27.0–33.0)
MCHC: 32.9 g/dL (ref 32.0–36.0)
MCV: 90.6 fL (ref 80.0–100.0)
MPV: 11.2 fL (ref 7.5–12.5)
Monocytes Relative: 3.3 %
Neutro Abs: 14603 cells/uL — ABNORMAL HIGH (ref 1500–7800)
Neutrophils Relative %: 85.4 %
Platelets: 379 10*3/uL (ref 140–400)
RBC: 5.43 10*6/uL (ref 4.20–5.80)
RDW: 16.7 % — ABNORMAL HIGH (ref 11.0–15.0)
Total Lymphocyte: 9.5 %
WBC: 17.1 10*3/uL — ABNORMAL HIGH (ref 3.8–10.8)

## 2019-04-13 LAB — LIPID PANEL
Cholesterol: 153 mg/dL (ref <200)
HDL: 23 mg/dL — ABNORMAL LOW (ref >=40)
LDL Cholesterol (Calc): 102 mg/dL (calc) — ABNORMAL HIGH
Non-HDL Cholesterol (Calc): 130 mg/dL (calc) — ABNORMAL HIGH (ref <130)
Total CHOL/HDL Ratio: 6.7 (calc) — ABNORMAL HIGH (ref <5.0)
Triglycerides: 165 mg/dL — ABNORMAL HIGH (ref <150)

## 2019-04-13 LAB — COMPLETE METABOLIC PANEL WITH GFR
AG Ratio: 1.9 (calc) (ref 1.0–2.5)
ALT: 23 U/L (ref 9–46)
AST: 28 U/L (ref 10–35)
Albumin: 4.5 g/dL (ref 3.6–5.1)
Alkaline phosphatase (APISO): 71 U/L (ref 35–144)
BUN: 21 mg/dL (ref 7–25)
CO2: 27 mmol/L (ref 20–32)
Calcium: 9.9 mg/dL (ref 8.6–10.3)
Chloride: 97 mmol/L — ABNORMAL LOW (ref 98–110)
Creat: 1.06 mg/dL (ref 0.70–1.18)
GFR, Est African American: 78 mL/min/{1.73_m2} (ref >=60)
GFR, Est Non African American: 67 mL/min/{1.73_m2} (ref >=60)
Globulin: 2.4 g/dL (calc) (ref 1.9–3.7)
Glucose, Bld: 88 mg/dL (ref 65–99)
Potassium: 5 mmol/L (ref 3.5–5.3)
Sodium: 136 mmol/L (ref 135–146)
Total Bilirubin: 1 mg/dL (ref 0.2–1.2)
Total Protein: 6.9 g/dL (ref 6.1–8.1)

## 2019-04-13 LAB — INSULIN, RANDOM: Insulin: 18 u[IU]/mL

## 2019-04-13 LAB — HEMOGLOBIN A1C
Hgb A1c MFr Bld: 5.9 % of total Hgb — ABNORMAL HIGH (ref <5.7)
Mean Plasma Glucose: 123 (calc)
eAG (mmol/L): 6.8 (calc)

## 2019-04-13 LAB — URINALYSIS, ROUTINE W REFLEX MICROSCOPIC
Bilirubin Urine: NEGATIVE
Glucose, UA: NEGATIVE
Hgb urine dipstick: NEGATIVE
Ketones, ur: NEGATIVE
Leukocytes,Ua: NEGATIVE
Nitrite: NEGATIVE
Protein, ur: NEGATIVE
Specific Gravity, Urine: 1.007 (ref 1.001–1.03)
pH: 6.5 (ref 5.0–8.0)

## 2019-04-13 LAB — PSA: PSA: 0.1 ng/mL (ref <=4.0)

## 2019-04-13 LAB — MICROALBUMIN / CREATININE URINE RATIO
Creatinine, Urine: 30 mg/dL (ref 20–320)
Microalb Creat Ratio: 50 mcg/mg creat — ABNORMAL HIGH (ref <30)
Microalb, Ur: 1.5 mg/dL

## 2019-04-13 LAB — TSH: TSH: 2.05 mIU/L (ref 0.40–4.50)

## 2019-04-13 LAB — MAGNESIUM: Magnesium: 1.7 mg/dL (ref 1.5–2.5)

## 2019-04-13 LAB — VITAMIN D 25 HYDROXY (VIT D DEFICIENCY, FRACTURES): Vit D, 25-Hydroxy: 89 ng/mL (ref 30–100)

## 2019-04-13 LAB — URIC ACID: Uric Acid, Serum: 5.9 mg/dL (ref 4.0–8.0)

## 2019-04-18 ENCOUNTER — Other Ambulatory Visit: Payer: Self-pay | Admitting: Internal Medicine

## 2019-04-25 NOTE — Progress Notes (Signed)
HEMATOLOGY/ONCOLOGY CLINIC NOTE  Date of Service: 04/26/2019  Patient Care Team: [name], MD as PCP - General (Internal Medicine) Georg Ruddle, Ashok Cordia, MD as Referring Physician (Cardiology) Shirlee More Ronalee Red, MD as Referring Physician (Cardiology) Brunetta Genera, MD as Consulting Physician (Hematology)  CHIEF COMPLAINTS/PURPOSE OF CONSULTATION:  F/u for MPN  HISTORY OF PRESENTING ILLNESS:   [name] [name] is a wonderful 78 y.o. male who has been referred to [place] by Dr. [name]  for evaluation and management of Thrombocytosis. He is accompanied today by his wife. The pt reports that he is doing well overall.   The pt reports that he has never had any blood clots.  He has atrial fibrillation and has taken Coumadin since 2015. He had heart surgery in 2015, and had his ICD placed in 2016. The pt also takes 81g aspirin daily.   The pt notes that he has not been aware of his high platelets previously, but only as of his most recent labs. He denies any recent major bleeds, surgeries, and other trauma. He denies having a splenectomy at any time as well.   The pt notes that his right kidney gets sore from time to time when he doesn't stay well hydrated. He also notes that when he urinates at these times, his urine is very dark. The pt adds that he had kidney stones one time. The pt is also taking Lasix and notes that he urinates frequently.   The pt adds that his gums have been very sore, even to touch from his tongue.  The pt notes that he does not want to use a CPAP after a bad experience with a previous sleep study that did diagnose him with sleep apnea. He notes that he falls asleep several times a day as well, falling asleep easily when he sits down, and denying falling asleep while driving. He also notes some increased difficulty remembering.   Most recent lab results (12/09/17) of CBC is as follows: all values are WNL except for WBC at 11.9k, RDW at 18.4, PLT at  649k, ANC at 9.7k.  On review of systems, pt reports good energy levels, falling asleep several times a day, staying active, and denies recent surgery, recent trauma, bleeding, unexpected weight loss, pain along the spine, abdominal pains, and any other symptoms.   On Family Hx the pt reports prostate cancer.   Interval History:   [name] returns today for management and evaluation of his JAK2 positive MPN. The patient's last visit with [place] was on 02/23/2019. The pt reports that he is doing well overall.  The pt reports that he had a good, but somewhat lonely Thanksgiving as he was unable to see most of his family due to social distancing. Pt has been having some concerns with his short-term memory. His wife has noticed that his memory has been worsened as well. Pt is seeing Dr. Melford Aase as his PCP and has briefly spoken to him about his memory concerns. His wife suggested that he take [place] to help with his memory. He has been taking a daily multivitamin with Iron along with the [place]. Pt has noticed that in the morning, directly after taking his medications, he has to have a bowel movement and is having significant gas as well. He was having some issues moving his bowels and bleeding hemorrhoids. He began taking more Miralax and his symptoms have subsided somewhat. Pt is unsure if he has been taking Hydroxyurea with food but thinks  that it may contribute to his gas. Pt stopped walking for a time but has recently started back walking on the treadmill again. He is still having some leg swelling but notes that his Lasix dosage has recently doubled.   Lab results today (04/26/19) of CBC w/diff and CMP is as follows: all values are WNL except for WBC at 15.6K, RDW at 17.3, Neutro Abs at 12.9K, Basophils Abs at 0.2K, Abs Immature Granulocytes at 0.21K, Glucose at 153, Creatinine at 1.25, GFR Est Non Af Am at 55. 04/26/2019 Ferritin at 16  On review of systems, pt reports short-term memory concerns,  gas, improving constipation, improving bleeding hemorrhoids, leg swelling and denies abdominal pain and any other symptoms.   MEDICAL HISTORY:  Past Medical History:  Diagnosis Date   A-fib ([place]) 01/2014   AICD (automatic cardioverter/defibrillator) present    [employer]   Arthritis    ASHD (arteriosclerotic heart disease) 2015   s/p CABG   Cataract    s/p left   COPD (chronic obstructive pulmonary disease) (HCC)    by CXR, pt unsure of this   GERD (gastroesophageal reflux disease)    Gout 2014   Hyperlipidemia    Hypertension    Hypothyroidism    LBBB (left bundle branch block)    Pre-diabetes    Prediabetes 01/2014   Presence of permanent cardiac pacemaker    Sleep apnea    no CPAP   Thyroid disease    hypothyroid    SURGICAL HISTORY: Past Surgical History:  Procedure Laterality Date   CARDIAC DEFIBRILLATOR PLACEMENT  07/2014   CATARACT EXTRACTION W/ INTRAOCULAR LENS IMPLANT Left    CORONARY ARTERY BYPASS GRAFT  11/2013   lumb laminectomy  [id number]   x 3   LUMBAR FUSION  2013   NASAL SEPTOPLASTY W/ TURBINOPLASTY Bilateral 01/30/2016   Procedure: NASAL SEPTOPLASTY WITH BILATERAL TURBINATE REDUCTION;  Surgeon: Rozetta Nunnery, MD;  Location: [facility];  Service: ENT;  Laterality: Bilateral;   UVULECTOMY N/A 01/30/2016   Procedure: Martyn Ehrich;  Surgeon: Rozetta Nunnery, MD;  Location: [facility];  Service: ENT;  Laterality: N/A;    SOCIAL HISTORY: Social History   Socioeconomic History   Marital status: Married    Spouse name: [place]    Number of children: Not on file   Years of education: 13   Highest education level: Not on file  Occupational History   Occupation: Retired  Scientist, product/process development strain: Not on file   Food insecurity    Worry: Not on file    Inability: Not on Lexicographer needs    Medical: Not on file    Non-medical: Not on file  Tobacco Use   Smoking status: Former Smoker     Packs/day: 2.00    Years: 25.00    Pack years: 50.00    Types: Cigarettes    Quit date: 05/20/1983    Years since quitting: 35.9   Smokeless tobacco: Never Used  Substance and Sexual Activity   Alcohol use: Not Currently    Alcohol/week: 0.0 standard drinks   Drug use: No   Sexual activity: Not on file  Lifestyle   Physical activity    Days per week: Not on file    Minutes per session: Not on file   Stress: Not on file  Relationships   Social connections    Talks on phone: Not on file    Gets together: Not on file    Attends  religious service: Not on file    Active member of club or organization: Not on file    Attends meetings of clubs or organizations: Not on file    Relationship status: Not on file   Intimate partner violence    Fear of current or ex partner: Not on file    Emotionally abused: Not on file    Physically abused: Not on file    Forced sexual activity: Not on file  Other Topics Concern   Not on file  Social History Narrative   Lives with wife, [name]   Caffeine use: daily (Coffee)    FAMILY HISTORY: Family History  Problem Relation Age of Onset   Alzheimer's disease Mother    Mental illness Mother    Depression Mother    Heart disease Father 63       had 42 MI's & died 88 yo   Alcohol abuse Son    Stroke Maternal Grandmother     ALLERGIES:  is allergic to other.  MEDICATIONS:  Current Outpatient Medications  Medication Sig Dispense Refill   acetaminophen (TYLENOL) 500 MG tablet Take 500 mg by mouth every 6 (six) hours as needed for mild pain.      albuterol (PROVENTIL HFA;VENTOLIN HFA) 108 (90 Base) MCG/ACT inhaler Inhale 2 puffs into the lungs every 6 (six) hours as needed for wheezing or shortness of breath. 1 Inhaler 3   albuterol (PROVENTIL) (2.5 MG/3ML) 0.083% nebulizer solution USE 1 VIAL IN NEBULIZER EVERY 6 HOURS AS NEEDED FOR WHEEZING OR SHORTNESS OF BREATH 75 mL 0   allopurinol (ZYLOPRIM) 300 MG tablet Take 1 tablet  Daily to prevent Gout 90 tablet 3   aspirin EC 81 MG tablet Take 81 mg by mouth daily.     atorvastatin (LIPITOR) 40 MG tablet Take 1 tablet Daily for Cholesterol 90 tablet 3   Black Pepper-Turmeric (TURMERIC COMPLEX/BLACK PEPPER PO) Take 1,000 mg by mouth daily.      Cholecalciferol (VITAMIN D PO) Take 20,000 Units by mouth daily.      diphenhydrAMINE (BENADRYL) 25 MG tablet Take 25 mg by mouth at bedtime.     docusate sodium (COLACE) 100 MG capsule Take 100 mg by mouth 2 (two) times daily. 1 tablet in the morning and 1 tablet at bedtime     doxazosin (CARDURA) 4 MG tablet TAKE 1 TABLET EVERY DAY 90 tablet 3   finasteride (PROSCAR) 5 MG tablet Take 1 tablet Daily for Prostate 90 tablet 3   fluticasone (FLONASE) 50 MCG/ACT nasal spray Place 2 sprays into both nostrils at bedtime. PRN 16 g 2   furosemide (LASIX) 40 MG tablet Take 1 tablet (40 mg total) by mouth daily. (Patient taking differently: Take 40 mg by mouth 2 (two) times daily. ) 90 tablet 3   hydroxyurea (HYDREA) 500 MG capsule 1000 mg (2 caps) po daily .Take with food. 120 capsule 3   levothyroxine (SYNTHROID) 200 MCG tablet Take 1 tablet daily on an empty stomach with only water for 30 minutes & no Antacid meds, Calcium or Magnesium for 4 hours & avoid Biotin 90 tablet 3   losartan (COZAAR) 100 MG tablet Take 100 mg by mouth daily.     Magnesium 400 MG TABS Take 1 tablet by mouth daily.     metoprolol (TOPROL-XL) 200 MG 24 hr tablet Take 200 mg by mouth daily.     omeprazole (PRILOSEC) 20 MG capsule TAKE 1 CAPSULE TWICE DAILY 180 capsule 3   OVER THE COUNTER  MEDICATION daily. Vitamin A to Z 50 plus     OVER THE COUNTER MEDICATION DHA 500 mg capsule daily.     polyethylene glycol (MIRALAX / GLYCOLAX) packet Take 17 g by mouth daily.     spironolactone (ALDACTONE) 25 MG tablet Take 1 tablet Daily for Heart & BP 90 tablet 3   warfarin (COUMADIN) 5 MG tablet Take 1 to 2 tablets Daily as Directed (Patient taking  differently: 7.5 mg. Take 1 to 2 tablets Daily as Directed Pt stated he is taking 7.5 of coumadin 04/26/19) 145 tablet 3   zinc gluconate 50 MG tablet Take 50 mg by mouth daily.     No current facility-administered medications for this visit.     REVIEW OF SYSTEMS:   A 10+ POINT REVIEW OF SYSTEMS WAS OBTAINED including neurology, dermatology, psychiatry, cardiac, respiratory, lymph, extremities, GI, GU, Musculoskeletal, constitutional, breasts, reproductive, HEENT.  All pertinent positives are noted in the HPI.  All others are negative.   PHYSICAL EXAMINATION: . Vitals:   04/26/19 1024  BP: 130/67  Pulse: 70  Resp: 18  Temp: 98 F (36.7 C)  SpO2: 97%   Filed Weights   04/26/19 1024  Weight: 267 lb 3.2 oz (121.2 kg)   .Body mass index is 39.46 kg/m.   GENERAL:alert, in no acute distress and comfortable SKIN: no acute rashes, no significant lesions EYES: conjunctiva are pink and non-injected, sclera anicteric OROPHARYNX: MMM, no exudates, no oropharyngeal erythema or ulceration NECK: supple, no JVD LYMPH:  no palpable lymphadenopathy in the cervical, axillary or inguinal regions LUNGS: clear to auscultation b/l with normal respiratory effort HEART: regular rate & rhythm ABDOMEN:  normoactive bowel sounds , non tender, not distended. No palpable hepatosplenomegaly.  Extremity: 1+ pedal edema PSYCH: alert & oriented x 3 with fluent speech NEURO: no focal motor/sensory deficits  LABORATORY DATA:  I have reviewed the data as listed  . CBC Latest Ref Rng & Units 04/26/2019 04/12/2019 02/23/2019  WBC 4.0 - 10.5 K/uL 15.6(H) 17.1(H) 16.5(H)  Hemoglobin 13.0 - 17.0 g/dL 16.1 16.2 15.6  Hematocrit 39.0 - 52.0 % 51.7 49.2 50.2  Platelets 150 - 400 K/uL 372 379 412(H)    . CMP Latest Ref Rng & Units 04/26/2019 04/12/2019 02/23/2019  Glucose 70 - 99 mg/dL 153(H) 88 135(H)  BUN 8 - 23 mg/dL 18 21 19   Creatinine 0.61 - 1.24 mg/dL 1.25(H) 1.06 1.17  Sodium 135 - 145 mmol/L 141 136  139  Potassium 3.5 - 5.1 mmol/L 4.0 5.0 4.1  Chloride 98 - 111 mmol/L 101 97(L) 101  CO2 22 - 32 mmol/L 30 27 29   Calcium 8.9 - 10.3 mg/dL 9.1 9.9 9.3  Total Protein 6.5 - 8.1 g/dL 6.9 6.9 6.8  Total Bilirubin 0.3 - 1.2 mg/dL 1.0 1.0 1.0  Alkaline Phos 38 - 126 U/L 72 - 75  AST 15 - 41 U/L 30 28 24   ALT 0 - 44 U/L 27 23 25    . Lab Results  Component Value Date   LDH 442 (H) 12/21/2018    02/09/18 JAK2 Mutation Study:    02/09/18 BCR ABL FISH:    RADIOGRAPHIC STUDIES: I have personally reviewed the radiological images as listed and agreed with the findings in the report. No results found.  ASSESSMENT & PLAN:   78 y.o. male with  1. 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 Some associated  leukocytosis. No history of splenectomy. No previous h/o VTE, but significant cardiac risk factors. 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   02/09/18 BCR ABL FISH revealed no evidence of BCR/ABL rearrangement   PLAN: -Discussed pt labwork today, 04/26/19; PLTs look good, RDW is high -Goal for PLT between 200k and 400k, 12/08 PLTs at 372 -Advised pt not to take a multivitamin that includes Iron -Recommended pt take a multivitamin w/o Iron or a B Complex vitamin  -Will give therapeutic phlebotomy today  -Plan for therapeutic phlebotomy in 3 months -Continue 1000mg  Hydroxyurea 7 days/week -Recommended sun protection strategies as Hydroxyurea can increase sensitivity of skin, and that he should watch for mouth sores as well  -Will see back in 3 months  2. . Patient Active Problem List   Diagnosis Date Noted   Memory changes 01/10/2019   Polycythemia vera ([place]) 10/21/2018   Thrombocytosis ([place]) 12/29/2017    Senile purpura ([place]) 09/01/2017   Emphysema of lung ([place]) 06/01/2017   Tortuous aorta ([place]) 06/01/2017   ASHD/CABG x 3V (11/2013) 02/06/2015   Encounter for monitoring Coumadin therapy 02/06/2015   Cardiac pacemaker/AICD (01/2014) 02/06/2015   Morbid obesity ([name]) 01/03/2015   HTN (hypertension) 01/02/2015   Hyperlipidemia, mixed 01/02/2015   Prediabetes 01/02/2015   Vitamin D deficiency 01/02/2015   GERD  01/02/2015   BPH (benign prostatic hyperplasia) 01/02/2015   Medication management 01/02/2015   Atrial fibrillation ([place]) 01/02/2015   Hypothyroidism 01/02/2015   OSA (obstructive sleep apnea) 01/02/2015  -continue f/u with PCP to optimize atherosclerotic risk factors.   FOLLOW UP: -RTC with Dr Irene Limbo with labs and appointment for therapeutic phlebotomy in 3 months   The total time spent in the appt was 20 minutes and more than 50% was on counseling and direct patient cares.  All of the patient's questions were answered with apparent satisfaction. The patient knows to call the clinic with any problems, questions or concerns.  Sullivan Lone MD [place] AAHIVMS [facility] Hematology/Oncology Physician [facility]  (Office):       [phone] (Work cell):  [phone] (Fax):           [phone]  04/26/2019 11:38 AM  I, Yevette Edwards, am acting as a scribe for Dr. Sullivan Lone.   .I have reviewed the above documentation for accuracy and completeness, and I agree with the above. Brunetta Genera MD

## 2019-04-26 ENCOUNTER — Inpatient Hospital Stay: Payer: Medicare Other

## 2019-04-26 ENCOUNTER — Inpatient Hospital Stay (HOSPITAL_BASED_OUTPATIENT_CLINIC_OR_DEPARTMENT_OTHER): Payer: Medicare Other | Admitting: Hematology

## 2019-04-26 ENCOUNTER — Inpatient Hospital Stay: Payer: Medicare Other | Attending: Hematology

## 2019-04-26 ENCOUNTER — Other Ambulatory Visit: Payer: Self-pay

## 2019-04-26 VITALS — BP 109/64 | HR 69 | Temp 98.1°F | Resp 16

## 2019-04-26 VITALS — BP 130/67 | HR 70 | Temp 98.0°F | Resp 18 | Ht 69.0 in | Wt 267.2 lb

## 2019-04-26 DIAGNOSIS — Z7901 Long term (current) use of anticoagulants: Secondary | ICD-10-CM | POA: Diagnosis not present

## 2019-04-26 DIAGNOSIS — I4891 Unspecified atrial fibrillation: Secondary | ICD-10-CM | POA: Diagnosis not present

## 2019-04-26 DIAGNOSIS — Z7982 Long term (current) use of aspirin: Secondary | ICD-10-CM | POA: Diagnosis not present

## 2019-04-26 DIAGNOSIS — D45 Polycythemia vera: Secondary | ICD-10-CM

## 2019-04-26 DIAGNOSIS — I2510 Atherosclerotic heart disease of native coronary artery without angina pectoris: Secondary | ICD-10-CM | POA: Diagnosis not present

## 2019-04-26 DIAGNOSIS — D72829 Elevated white blood cell count, unspecified: Secondary | ICD-10-CM | POA: Diagnosis present

## 2019-04-26 DIAGNOSIS — Z79899 Other long term (current) drug therapy: Secondary | ICD-10-CM | POA: Diagnosis not present

## 2019-04-26 LAB — CBC WITH DIFFERENTIAL/PLATELET
Abs Immature Granulocytes: 0.21 10*3/uL — ABNORMAL HIGH (ref 0.00–0.07)
Basophils Absolute: 0.2 10*3/uL — ABNORMAL HIGH (ref 0.0–0.1)
Basophils Relative: 1 %
Eosinophils Absolute: 0.1 10*3/uL (ref 0.0–0.5)
Eosinophils Relative: 1 %
HCT: 51.7 % (ref 39.0–52.0)
Hemoglobin: 16.1 g/dL (ref 13.0–17.0)
Immature Granulocytes: 1 %
Lymphocytes Relative: 11 %
Lymphs Abs: 1.7 10*3/uL (ref 0.7–4.0)
MCH: 30.6 pg (ref 26.0–34.0)
MCHC: 31.1 g/dL (ref 30.0–36.0)
MCV: 98.3 fL (ref 80.0–100.0)
Monocytes Absolute: 0.5 10*3/uL (ref 0.1–1.0)
Monocytes Relative: 4 %
Neutro Abs: 12.9 10*3/uL — ABNORMAL HIGH (ref 1.7–7.7)
Neutrophils Relative %: 82 %
Platelets: 372 10*3/uL (ref 150–400)
RBC: 5.26 MIL/uL (ref 4.22–5.81)
RDW: 17.3 % — ABNORMAL HIGH (ref 11.5–15.5)
WBC: 15.6 10*3/uL — ABNORMAL HIGH (ref 4.0–10.5)
nRBC: 0 % (ref 0.0–0.2)

## 2019-04-26 LAB — CMP (CANCER CENTER ONLY)
ALT: 27 U/L (ref 0–44)
AST: 30 U/L (ref 15–41)
Albumin: 4.2 g/dL (ref 3.5–5.0)
Alkaline Phosphatase: 72 U/L (ref 38–126)
Anion gap: 10 (ref 5–15)
BUN: 18 mg/dL (ref 8–23)
CO2: 30 mmol/L (ref 22–32)
Calcium: 9.1 mg/dL (ref 8.9–10.3)
Chloride: 101 mmol/L (ref 98–111)
Creatinine: 1.25 mg/dL — ABNORMAL HIGH (ref 0.61–1.24)
GFR, Est AFR Am: >60 mL/min (ref >60)
GFR, Estimated: 55 mL/min — ABNORMAL LOW (ref >60)
Glucose, Bld: 153 mg/dL — ABNORMAL HIGH (ref 70–99)
Potassium: 4 mmol/L (ref 3.5–5.1)
Sodium: 141 mmol/L (ref 135–145)
Total Bilirubin: 1 mg/dL (ref 0.3–1.2)
Total Protein: 6.9 g/dL (ref 6.5–8.1)

## 2019-04-26 LAB — FERRITIN: Ferritin: 16 ng/mL — ABNORMAL LOW (ref 24–336)

## 2019-04-26 NOTE — Patient Instructions (Signed)

## 2019-04-26 NOTE — Progress Notes (Signed)
Patient here for phlebotomy. Removed 550cc with phlebotomy kit from right AC. Patient tolerated well, and waited 20 minutes post observation. Vitals signs stable at that time and patient stated feeling well. RN allowed patient to leave for home at that time.

## 2019-04-27 ENCOUNTER — Telehealth: Payer: Self-pay | Admitting: Hematology

## 2019-04-27 DIAGNOSIS — Z7901 Long term (current) use of anticoagulants: Secondary | ICD-10-CM | POA: Diagnosis not present

## 2019-04-27 DIAGNOSIS — I480 Paroxysmal atrial fibrillation: Secondary | ICD-10-CM | POA: Diagnosis not present

## 2019-04-27 DIAGNOSIS — Z95810 Presence of automatic (implantable) cardiac defibrillator: Secondary | ICD-10-CM | POA: Diagnosis not present

## 2019-04-27 DIAGNOSIS — I255 Ischemic cardiomyopathy: Secondary | ICD-10-CM | POA: Diagnosis not present

## 2019-04-27 NOTE — Telephone Encounter (Signed)
Scheduled per los. Called and spoke with patient. Confirmed appt 

## 2019-05-04 ENCOUNTER — Other Ambulatory Visit: Payer: Self-pay | Admitting: *Deleted

## 2019-05-04 DIAGNOSIS — D45 Polycythemia vera: Secondary | ICD-10-CM

## 2019-05-04 DIAGNOSIS — Z1211 Encounter for screening for malignant neoplasm of colon: Secondary | ICD-10-CM

## 2019-05-04 LAB — POC HEMOCCULT BLD/STL (HOME/3-CARD/SCREEN)
Card #2 Fecal Occult Blod, POC: NEGATIVE
Card #3 Fecal Occult Blood, POC: NEGATIVE
Fecal Occult Blood, POC: NEGATIVE

## 2019-05-09 DIAGNOSIS — Z1211 Encounter for screening for malignant neoplasm of colon: Secondary | ICD-10-CM

## 2019-05-09 DIAGNOSIS — Z1212 Encounter for screening for malignant neoplasm of rectum: Secondary | ICD-10-CM

## 2019-05-11 DIAGNOSIS — I255 Ischemic cardiomyopathy: Secondary | ICD-10-CM | POA: Diagnosis not present

## 2019-05-18 DIAGNOSIS — Z95810 Presence of automatic (implantable) cardiac defibrillator: Secondary | ICD-10-CM | POA: Diagnosis not present

## 2019-05-18 DIAGNOSIS — I480 Paroxysmal atrial fibrillation: Secondary | ICD-10-CM | POA: Diagnosis not present

## 2019-05-18 DIAGNOSIS — Z7901 Long term (current) use of anticoagulants: Secondary | ICD-10-CM | POA: Diagnosis not present

## 2019-05-18 DIAGNOSIS — I255 Ischemic cardiomyopathy: Secondary | ICD-10-CM | POA: Diagnosis not present

## 2019-05-27 DIAGNOSIS — M25512 Pain in left shoulder: Secondary | ICD-10-CM | POA: Diagnosis not present

## 2019-06-08 DIAGNOSIS — I255 Ischemic cardiomyopathy: Secondary | ICD-10-CM | POA: Diagnosis not present

## 2019-06-08 DIAGNOSIS — Z95810 Presence of automatic (implantable) cardiac defibrillator: Secondary | ICD-10-CM | POA: Diagnosis not present

## 2019-06-08 DIAGNOSIS — Z7901 Long term (current) use of anticoagulants: Secondary | ICD-10-CM | POA: Diagnosis not present

## 2019-06-08 DIAGNOSIS — I480 Paroxysmal atrial fibrillation: Secondary | ICD-10-CM | POA: Diagnosis not present

## 2019-06-21 DIAGNOSIS — M19012 Primary osteoarthritis, left shoulder: Secondary | ICD-10-CM | POA: Diagnosis not present

## 2019-06-21 DIAGNOSIS — M25512 Pain in left shoulder: Secondary | ICD-10-CM | POA: Diagnosis not present

## 2019-06-24 DIAGNOSIS — M25512 Pain in left shoulder: Secondary | ICD-10-CM | POA: Diagnosis not present

## 2019-07-05 DIAGNOSIS — I480 Paroxysmal atrial fibrillation: Secondary | ICD-10-CM | POA: Diagnosis not present

## 2019-07-05 DIAGNOSIS — Z7901 Long term (current) use of anticoagulants: Secondary | ICD-10-CM | POA: Diagnosis not present

## 2019-07-05 DIAGNOSIS — Z95810 Presence of automatic (implantable) cardiac defibrillator: Secondary | ICD-10-CM | POA: Diagnosis not present

## 2019-07-05 DIAGNOSIS — I255 Ischemic cardiomyopathy: Secondary | ICD-10-CM | POA: Diagnosis not present

## 2019-07-13 ENCOUNTER — Ambulatory Visit (INDEPENDENT_AMBULATORY_CARE_PROVIDER_SITE_OTHER): Payer: Medicare Other | Admitting: Internal Medicine

## 2019-07-13 ENCOUNTER — Other Ambulatory Visit: Payer: Self-pay

## 2019-07-13 ENCOUNTER — Encounter: Payer: Self-pay | Admitting: Internal Medicine

## 2019-07-13 VITALS — BP 120/72 | HR 72 | Temp 97.0°F | Resp 18 | Ht 69.0 in | Wt 266.2 lb

## 2019-07-13 DIAGNOSIS — Z79899 Other long term (current) drug therapy: Secondary | ICD-10-CM

## 2019-07-13 DIAGNOSIS — R7309 Other abnormal glucose: Secondary | ICD-10-CM | POA: Diagnosis not present

## 2019-07-13 DIAGNOSIS — M1000 Idiopathic gout, unspecified site: Secondary | ICD-10-CM

## 2019-07-13 DIAGNOSIS — E559 Vitamin D deficiency, unspecified: Secondary | ICD-10-CM | POA: Diagnosis not present

## 2019-07-13 DIAGNOSIS — I480 Paroxysmal atrial fibrillation: Secondary | ICD-10-CM

## 2019-07-13 DIAGNOSIS — I10 Essential (primary) hypertension: Secondary | ICD-10-CM | POA: Diagnosis not present

## 2019-07-13 DIAGNOSIS — E782 Mixed hyperlipidemia: Secondary | ICD-10-CM | POA: Diagnosis not present

## 2019-07-13 DIAGNOSIS — Z950 Presence of cardiac pacemaker: Secondary | ICD-10-CM

## 2019-07-13 DIAGNOSIS — I2510 Atherosclerotic heart disease of native coronary artery without angina pectoris: Secondary | ICD-10-CM | POA: Diagnosis not present

## 2019-07-13 DIAGNOSIS — E039 Hypothyroidism, unspecified: Secondary | ICD-10-CM | POA: Diagnosis not present

## 2019-07-13 NOTE — Patient Instructions (Signed)
Vit D  & Vit C 1,000 mg   are recommended to help protect  against the Covid-19 and other Corona viruses.    Also it's recommended  to take  Zinc 50 mg  to help  protect against the Covid-19   and best place to get  is also on [employer]  and don't pay more than 6-8 cents /pill !   ===================================== Coronavirus (COVID-19) Are you at risk?  Are you at risk for the Coronavirus (COVID-19)?  To be considered HIGH RISK for Coronavirus (COVID-19), you have to meet the following criteria:  . Traveled to [place], [place], [place], [place] or [place]; or in the [place] to [place], [place], [place]  . or [place]; and have fever, cough, and shortness of breath within the last 2 weeks of travel OR . Been in close contact with a person diagnosed with COVID-19 within the last 2 weeks and have  . fever, cough,and shortness of breath .  . IF YOU DO NOT MEET THESE CRITERIA, YOU ARE CONSIDERED LOW RISK FOR COVID-19.  What to do if you are HIGH RISK for COVID-19?  . If you are having a medical emergency, call 911. . Seek medical care right away. Before you go to a doctor's office, urgent care or emergency department, .  call ahead and tell them about your recent travel, contact with someone diagnosed with COVID-19  .  and your symptoms.  . You should receive instructions from your physician's office regarding next steps of care.  . When you arrive at healthcare provider, tell the healthcare staff immediately you have returned from  . visiting [place], [place], [place], [place] or [place]; or traveled in the [place] to [place], [place],  . [place] or [place] in the last two weeks or you have been in close contact with a person diagnosed with  . COVID-19 in the last 2 weeks.   . Tell the health care staff about your symptoms: fever, cough and shortness of breath. . After you have been seen by a medical provider, you will be either: o Tested for  (COVID-19) and discharged home on quarantine except to seek medical care if  o symptoms worsen, and asked to  - Stay home and avoid contact with others until you get your results (4-5 days)  - Avoid travel on public transportation if possible (such as bus, train, or airplane) or o Sent to the Emergency Department by EMS for evaluation, COVID-19 testing  and  o possible admission depending on your condition and test results.  What to do if you are LOW RISK for COVID-19?  Reduce your risk of any infection by using the same precautions used for avoiding the common cold or flu:  . Wash your hands often with soap and warm water for at least 20 seconds.  If soap and water are not readily available,  . use an alcohol-based hand sanitizer with at least 60% alcohol.  . If coughing or sneezing, cover your mouth and nose by coughing or sneezing into the elbow areas of your shirt or coat, .  into a tissue or into your sleeve (not your hands). . Avoid shaking hands with others and consider head nods or verbal greetings only. . Avoid touching your eyes, nose, or mouth with unwashed hands.  . Avoid close contact with people who are sick. . Avoid places or events with large numbers of people in one location, like concerts or sporting events. .   Carefully consider travel plans you have or are making. . If you are planning any travel outside or inside the [place], visit the CDC's Travelers' Health webpage for the latest health notices. . If you have some symptoms but not all symptoms, continue to monitor at home and seek medical attention  . if your symptoms worsen. . If you are having a medical emergency, call 911.   ++++++++++++++++++++++++++++++++ Recommend Adult Low Dose Aspirin or  coated  Aspirin 81 mg daily  To reduce risk of Colon Cancer 40 %,  Skin Cancer 26 % ,  Melanoma 46%  and  Pancreatic cancer 60% ++++++++++++++++++++++++++++++++ Vitamin D goal  is between 70-100.  Please make sure that  you are taking your Vitamin D as directed.  It is very important as a natural anti-inflammatory  helping hair, skin, and nails, as well as reducing stroke and heart attack risk.  It helps your bones and helps with mood. It also decreases numerous cancer risks so please take it as directed.  Low Vit D is associated with a 200-300% higher risk for CANCER  and 200-300% higher risk for HEART   ATTACK  &  STROKE.   ...................................... It is also associated with higher death rate at younger ages,  autoimmune diseases like Rheumatoid arthritis, Lupus, Multiple Sclerosis.    Also many other serious conditions, like depression, Alzheimer's Dementia, infertility, muscle aches, fatigue, fibromyalgia - just to name a few. ++++++++++++++++++++ Recommend the book "The END of DIETING" by Dr Joel Fuhrman  & the book "The END of DIABETES " by Dr Joel Fuhrman At [employer] - get book & Audio CD's    Being diabetic has a  300% increased risk for heart attack, stroke, cancer, and alzheimer- type vascular dementia. It is very important that you work harder with diet by avoiding all foods that are white. Avoid white rice (brown & wild rice is OK), white potatoes (sweetpotatoes in moderation is OK), White bread or wheat bread or anything made out of white flour like bagels, donuts, rolls, buns, biscuits, cakes, pastries, cookies, pizza crust, and pasta (made from white flour & egg whites) - vegetarian pasta or spinach or wheat pasta is OK. Multigrain breads like Arnold's or Pepperidge Farm, or multigrain sandwich thins or flatbreads.  Diet, exercise and weight loss can reverse and cure diabetes in the early stages.  Diet, exercise and weight loss is very important in the control and prevention of complications of diabetes which affects every system in your body, ie. Brain - dementia/stroke, eyes - glaucoma/blindness, heart - heart attack/heart failure, kidneys - dialysis, stomach - gastric paralysis,  intestines - malabsorption, nerves - severe painful neuritis, circulation - gangrene & loss of a leg(s), and finally cancer and Alzheimers.    I recommend avoid fried & greasy foods,  sweets/candy, white rice (brown or wild rice or Quinoa is OK), white potatoes (sweet potatoes are OK) - anything made from white flour - bagels, doughnuts, rolls, buns, biscuits,white and wheat breads, pizza crust and traditional pasta made of white flour & egg white(vegetarian pasta or spinach or wheat pasta is OK).  Multi-grain bread is OK - like multi-grain flat bread or sandwich thins. Avoid alcohol in excess. Exercise is also important.    Eat all the vegetables you want - avoid meat, especially red meat and dairy - especially cheese.  Cheese is the most concentrated form of trans-fats which is the worst thing to clog up our arteries. Veggie cheese is OK which can be   found in the fresh produce section at [employer] or [employer] or [employer]  +++++++++++++++++++++ DASH Eating Plan  DASH stands for "Dietary Approaches to Stop Hypertension."   The DASH eating plan is a healthy eating plan that has been shown to reduce high blood pressure (hypertension). Additional health benefits may include reducing the risk of type 2 diabetes mellitus, heart disease, and stroke. The DASH eating plan may also help with weight loss. WHAT DO I NEED TO KNOW ABOUT THE DASH EATING PLAN? For the DASH eating plan, you will follow these general guidelines:  Choose foods with a percent daily value for sodium of less than 5% (as listed on the food label).  Use salt-free seasonings or herbs instead of table salt or sea salt.  Check with your health care provider or pharmacist before using salt substitutes.  Eat lower-sodium products, often labeled as "lower sodium" or "no salt added."  Eat fresh foods.  Eat more vegetables, fruits, and low-fat dairy products.  Choose whole grains. Look for the word "whole" as the first word in  the ingredient list.  Choose fish   Limit sweets, desserts, sugars, and sugary drinks.  Choose heart-healthy fats.  Eat veggie cheese   Eat more home-cooked food and less restaurant, buffet, and fast food.  Limit fried foods.  Cook foods using methods other than frying.  Limit canned vegetables. If you do use them, rinse them well to decrease the sodium.  When eating at a restaurant, ask that your food be prepared with less salt, or no salt if possible.                      WHAT FOODS CAN I EAT? Read Dr Joel Fuhrman's books on The End of Dieting & The End of Diabetes  Grains Whole grain or whole wheat bread. Brown rice. Whole grain or whole wheat pasta. Quinoa, bulgur, and whole grain cereals. Low-sodium cereals. Corn or whole wheat flour tortillas. Whole grain cornbread. Whole grain crackers. Low-sodium crackers.  Vegetables Fresh or frozen vegetables (raw, steamed, roasted, or grilled). Low-sodium or reduced-sodium tomato and vegetable juices. Low-sodium or reduced-sodium tomato sauce and paste. Low-sodium or reduced-sodium canned vegetables.   Fruits All fresh, canned (in natural juice), or frozen fruits.  Protein Products  All fish and seafood.  Dried beans, peas, or lentils. Unsalted nuts and seeds. Unsalted canned beans.  Dairy Low-fat dairy products, such as skim or 1% milk, 2% or reduced-fat cheeses, low-fat ricotta or cottage cheese, or plain low-fat yogurt. Low-sodium or reduced-sodium cheeses.  Fats and Oils Tub margarines without trans fats. Light or reduced-fat mayonnaise and salad dressings (reduced sodium). Avocado. Safflower, olive, or canola oils. Natural peanut or almond butter.  Other Unsalted popcorn and pretzels. The items listed above may not be a complete list of recommended foods or beverages. Contact your dietitian for more options.  +++++++++++++++  WHAT FOODS ARE NOT RECOMMENDED? Grains/ White flour or wheat flour White bread. White pasta.  White rice. Refined cornbread. Bagels and croissants. Crackers that contain trans fat.  Vegetables  Creamed or fried vegetables. Vegetables in a . Regular canned vegetables. Regular canned tomato sauce and paste. Regular tomato and vegetable juices.  Fruits Dried fruits. Canned fruit in light or heavy syrup. Fruit juice.  Meat and Other Protein Products Meat in general - RED meat & White meat.  Fatty cuts of meat. Ribs, chicken wings, all processed meats as bacon, sausage, bologna, salami, fatback, hot dogs, bratwurst and packaged   luncheon meats.  Dairy Whole or 2% milk, cream, half-and-half, and cream cheese. Whole-fat or sweetened yogurt. Full-fat cheeses or blue cheese. Non-dairy creamers and whipped toppings. Processed cheese, cheese spreads, or cheese curds.  Condiments Onion and garlic salt, seasoned salt, table salt, and sea salt. Canned and packaged gravies. Worcestershire sauce. Tartar sauce. Barbecue sauce. Teriyaki sauce. Soy sauce, including reduced sodium. Steak sauce. Fish sauce. Oyster sauce. Cocktail sauce. Horseradish. Ketchup and mustard. Meat flavorings and tenderizers. Bouillon cubes. Hot sauce. Tabasco sauce. Marinades. Taco seasonings. Relishes.  Fats and Oils Butter, stick margarine, lard, shortening and bacon fat. Coconut, palm kernel, or palm oils. Regular salad dressings.  Pickles and olives. Salted popcorn and pretzels.  The items listed above may not be a complete list of foods and beverages to avoid.  +++++++++++++++++++++++++++++  Bleeding Precautions When on Anticoagulant Therapy  Anticoagulant therapy, also called blood thinner therapy, is medicine that helps to prevent and treat blood clots. The medicine works by stopping blood clots from forming or growing. Blood clots that form in your blood vessels can be dangerous. They can break loose and travel to the heart, lungs, or brain. This increases the risk of a heart attack, stroke, or blocked lung artery  (pulmonary embolism). Anticoagulants also increase the risk of bleeding. Try to protect yourself from cuts and other injuries that can cause bleeding. It is important to take anticoagulants exactly as told by your health care provider.  Why do I need to be on anticoagulant therapy?  You may need this medicine if you are at risk of developing a blood clot. Conditions that increase your risk of a blood clot include:  Being born with heart disease or a heart malformation (congenital heart disease).  Developing heart disease.  Having had surgery, such as valve replacement.  Having had a serious accident or other type of severe injury (trauma).  Having certain types of cancer.  Having certain diseases that can increase blood clotting.  Having a high risk of stroke or heart attack.  Having atrial fibrillation (AF). What are the common anticoagulant medicines? There are several types of anticoagulant medicines. The most common types are:  Medicines that you take by mouth (oral medicines), such as: ? Warfarin. ? Novel oral anticoagulants (NOACs), such as:  Direct thrombin inhibitors (dabigatran).  Factor Xa inhibitors (apixaban, edoxaban, and rivaroxaban).  Injections, such as: ? Unfractionated heparin. ? Low molecular weight heparin. These anticoagulants work in different ways to prevent blood clots. They also have different risks and side effects. What do I need to remember while on anticoagulant therapy? Taking anticoagulants  Take your medicine at the same time every day. If you forget to take your medicine, take it as soon as you remember. Do not double your dosage of medicine if you miss a whole day. Take your normal dose and call your health care provider.  Do not stop taking your medicine unless your health care provider approves. Stopping the medicine can increase your risk of developing a blood clot. Taking other medicines  Take over-the-counter and prescriptions  medicines only as told by your health care provider.  Do not take over-the-counter NSAIDs, including aspirin and ibuprofen, while you are on anticoagulant therapy. These medicines increase your risk of dangerous bleeding.  Get approval from your health care provider before you start taking any new medicines, vitamins, or herbal products. Some of these could interfere with your therapy. General instructions  Keep all follow-up visits as told by your health care provider. This  is important.  If you are pregnant or trying to get pregnant, talk with a health care provider about anticoagulants. Some of these medicines are not safe to take during pregnancy.  Tell all health care providers, including your dentist, that you are on anticoagulant therapy. It is especially important to tell providers before you have any surgery, medical procedures, or dental work done. What precautions should I take?   Be very careful when using knives, scissors, or other sharp objects.  Use an electric razor instead of a blade.  Do not use toothpicks.  Use a soft-bristled toothbrush. Brush your teeth gently.  Always wear shoes outdoors and wear slippers indoors.  Be careful when cutting your fingernails and toenails.  Place bath mats in the bathroom. If possible, install handrails as well.  Wear gloves while you do yard work.  Wear your seat belt.  Prevent falls by removing loose rugs and extension cords from areas where you walk. Use a cane or walker if you need it.  Avoid constipation by: ? Drinking enough fluid to keep your urine clear or pale yellow. ? Eating foods that are high in fiber, such as fresh fruits and vegetables, whole grains, and beans. ? Limiting foods that are high in fat and processed sugars, such as fried and sweet foods.  Do not play contact sports or participate in other activities that have a high risk for injury. What other precautions are important if on warfarin therapy? If  you are taking a type of anticoagulant called warfarin, make sure you:  Work with a diet and nutrition specialist (dietitian) to make an eating plan. Do not make any sudden changes to your diet after you have started your eating plan.  Do not drink alcohol. It can interfere with your medicine and increase your risk of an injury that causes bleeding.  Get regular blood tests as told by your health care provider. What are some questions to ask my health care provider?  Why do I need anticoagulant therapy?  What is the best anticoagulant therapy for my condition?  How long will I need anticoagulant therapy?  What are the side effects of anticoagulant therapy?  When should I take my medicine? What should I do if I forget to take it?  Will I need to have regular blood tests?  Do I need to change my diet? Are there foods or drinks that I should avoid?  What activities are safe for me?  What should I do if I want to get pregnant? Contact a health care provider if:  You miss a dose of medicine: ? And you are not sure what to do. ? For more than one day.  You have: ? Menstrual bleeding that is heavier than normal. ? Bloody or brown urine. ? Easy bruising. ? Black and tarry stool or bright red stool. ? Side effects from your medicine.  You feel weak or dizzy.  You become pregnant. Get help right away if:  You have bleeding that will not stop within 20 minutes from: ? The nose. ? The gums. ? A cut on the skin.  You have a severe headache or stomachache.  You vomit or cough up blood.  You fall or hit your head. Summary  Anticoagulant therapy, also called blood thinner therapy, is medicine that helps to prevent and treat blood clots.  Anticoagulants work in different ways to prevent blood clots. They also have different risks and side effects.  Talk with your health care provider  about any precautions that you should take while on anticoagulant therapy. This  information is not intended to replace advice given to you by your health care provider. Make sure you discuss any questions you have with your health care provider. Document Revised: 08/25/2018 Document Reviewed: 07/22/2016 Elsevier Patient Education  [place].

## 2019-07-13 NOTE — Progress Notes (Signed)
History of Present Illness:       This very nice 79 y.o. MWM  presents for 6 month follow up with HTN, ASHD/CABG/pAFib, HLD, Prediabetes and Vitamin D Deficiency. Patient has hx/o Gout quiescent on Allopurinol and also has hx/o GERD controled on his Omeprazole.  Patient is followed by Dr Irene Limbo of thrombocythemia and is on Hydrea for same. Patient has hx/o OSA but has been mask intolerant.       Patient is treated for HTN since 60 (age 40)  & BP has been controlled at home. Today's BP is at goal - 120/72.  Patient underwent CABG x 3 V in 2015 & had po Afib , failed CV and had AICD implanted and remains on Coumadin followed by his Cardiologist in [facility] Patient has had no complaints of any cardiac type chest pain, palpitations, dyspnea / orthopnea / PND, dizziness, claudication, or dependent edema.      Hyperlipidemia is controlled with diet & meds. Patient denies myalgias or other med SE's. Last Lipids were not at goal:  Lab Results  Component Value Date   CHOL 153 04/12/2019   HDL 23 (L) 04/12/2019   LDLCALC 102 (H) 04/12/2019   TRIG 165 (H) 04/12/2019   CHOLHDL 6.7 (H) 04/12/2019    Also, the patient has morbid Obesity (BMI 39.3+) and  history of PreDiabetes  (A1c 6.0% / 2015)  and has had no symptoms of reactive hypoglycemia, diabetic polys, paresthesias or visual blurring.  Last A1c was not at goal:  Lab Results  Component Value Date   HGBA1C 5.9 (H) 04/12/2019       Further, the patient also has history of Vitamin D Deficiency ("39" / 2016)  and supplements vitamin D without any suspected side-effects. Last vitamin D was at goal:  Lab Results  Component Value Date   VD25OH 89 04/12/2019    Current Outpatient Medications on File Prior to Visit  Medication Sig  . acetaminophen (TYLENOL) 500 MG tablet Take 500 mg by mouth every 6 (six) hours as needed for mild pain.   Marland Kitchen albuterol (PROVENTIL HFA;VENTOLIN HFA) 108 (90 Base) MCG/ACT inhaler Inhale 2 puffs into the  lungs every 6 (six) hours as needed for wheezing or shortness of breath.  Marland Kitchen albuterol (PROVENTIL) (2.5 MG/3ML) 0.083% nebulizer solution USE 1 VIAL IN NEBULIZER EVERY 6 HOURS AS NEEDED FOR WHEEZING OR SHORTNESS OF BREATH  . allopurinol (ZYLOPRIM) 300 MG tablet Take 1 tablet Daily to prevent Gout  . aspirin EC 81 MG tablet Take 81 mg by mouth daily.  Marland Kitchen atorvastatin (LIPITOR) 40 MG tablet Take 1 tablet Daily for Cholesterol  . Black Pepper-Turmeric (TURMERIC COMPLEX/BLACK PEPPER PO) Take 1,000 mg by mouth daily.   . Cholecalciferol (VITAMIN D PO) Take 10,000 Units by mouth daily.   . diphenhydrAMINE (BENADRYL) 25 MG tablet Take 25 mg by mouth at bedtime.  . docusate sodium (COLACE) 100 MG capsule Take 100 mg by mouth 2 (two) times daily. 1 tablet in the morning and 1 tablet at bedtime  . doxazosin (CARDURA) 4 MG tablet TAKE 1 TABLET EVERY DAY  . finasteride (PROSCAR) 5 MG tablet Take 1 tablet Daily for Prostate  . furosemide (LASIX) 40 MG tablet Take 1 tablet (40 mg total) by mouth daily. (Patient taking differently: Take 40 mg by mouth 2 (two) times daily. )  . hydroxyurea (HYDREA) 500 MG capsule 1000 mg (2 caps) po daily .Take with food.  Marland Kitchen levothyroxine (SYNTHROID) 200 MCG tablet  Take 1 tablet daily on an empty stomach with only water for 30 minutes & no Antacid meds, Calcium or Magnesium for 4 hours & avoid Biotin  . losartan (COZAAR) 100 MG tablet Take 100 mg by mouth daily.  . Magnesium 400 MG TABS Take 1 tablet by mouth daily.  . metoprolol (TOPROL-XL) 200 MG 24 hr tablet Take 200 mg by mouth daily.  Marland Kitchen omeprazole (PRILOSEC) 20 MG capsule TAKE 1 CAPSULE TWICE DAILY  . OVER THE COUNTER MEDICATION daily. Vitamin A to Z 50 plus  . OVER THE COUNTER MEDICATION DHA 500 mg capsule daily.  . polyethylene glycol (MIRALAX / GLYCOLAX) packet Take 17 g by mouth daily.  Marland Kitchen spironolactone (ALDACTONE) 25 MG tablet Take 1 tablet Daily for Heart & BP  . warfarin (COUMADIN) 5 MG tablet Take 1 to 2 tablets  Daily as Directed (Patient taking differently: 7.5 mg. Take 1 to 2 tablets Daily as Directed Pt stated he is taking 7.5 of coumadin 04/26/19)  . zinc gluconate 50 MG tablet Take 50 mg by mouth daily.   No current facility-administered medications on file prior to visit.    Allergies  Allergen Reactions  . Other Swelling    SODIUM SENSITIVE    PMHx:   Past Medical History:  Diagnosis Date  . A-fib ([place]) 01/2014  . AICD (automatic cardioverter/defibrillator) present    [employer]  . Arthritis   . ASHD (arteriosclerotic heart disease) 2015   s/p CABG  . Cataract    s/p left  . COPD (chronic obstructive pulmonary disease) ([place])    by CXR, pt unsure of this  . GERD (gastroesophageal reflux disease)   . Gout 2014  . Hyperlipidemia   . Hypertension   . Hypothyroidism   . LBBB (left bundle branch block)   . Pre-diabetes   . Prediabetes 01/2014  . Presence of permanent cardiac pacemaker   . Sleep apnea    no CPAP  . Thyroid disease    hypothyroid    Immunization History  Administered Date(s) Administered  . Influenza, High Dose Seasonal PF 04/23/2015, 02/18/2017, 03/29/2018, 02/12/2019  . Influenza,inj,Quad PF,6+ Mos 01/31/2016  . Moderna SARS-COVID-2 Vaccination 06/18/2019  . Pneumococcal Conjugate-13 11/21/2014  . Pneumococcal Polysaccharide-23 06/01/2017    Past Surgical History:  Procedure Laterality Date  . CARDIAC DEFIBRILLATOR PLACEMENT  07/2014  . CATARACT EXTRACTION W/ INTRAOCULAR LENS IMPLANT Left   . CORONARY ARTERY BYPASS GRAFT  11/2013  . lumb laminectomy  [phone]   x 3  . LUMBAR FUSION  2013  . NASAL SEPTOPLASTY W/ TURBINOPLASTY Bilateral 01/30/2016   Procedure: NASAL SEPTOPLASTY WITH BILATERAL TURBINATE REDUCTION;  Surgeon: Rozetta Nunnery, MD;  Location: [place];  Service: ENT;  Laterality: Bilateral;  . UVULECTOMY N/A 01/30/2016   Procedure: UVULECTOMY;  Surgeon: Rozetta Nunnery, MD;  Location: [facility] OR;  Service: ENT;  Laterality: N/A;      FHx:    Reviewed / unchanged  SHx:    Reviewed / unchanged   Systems Review:  Constitutional: Denies fever, chills, wt changes, headaches, insomnia, fatigue, night sweats, change in appetite. Eyes: Denies redness, blurred vision, diplopia, discharge, itchy, watery eyes.  ENT: Denies discharge, congestion, post nasal drip, epistaxis, sore throat, earache, hearing loss, dental pain, tinnitus, vertigo, sinus pain, snoring.  CV: Denies chest pain, palpitations, irregular heartbeat, syncope, dyspnea, diaphoresis, orthopnea, PND, claudication or edema. Respiratory: denies cough, dyspnea, DOE, pleurisy, hoarseness, laryngitis, wheezing.  Gastrointestinal: Denies dysphagia, odynophagia, heartburn, reflux, water brash, abdominal pain or  cramps, nausea, vomiting, bloating, diarrhea, constipation, hematemesis, melena, hematochezia  or hemorrhoids. Genitourinary: Denies dysuria, frequency, urgency, nocturia, hesitancy, discharge, hematuria or flank pain. Musculoskeletal: Denies arthralgias, myalgias, stiffness, jt. swelling, pain, limping or strain/sprain.  Skin: Denies pruritus, rash, hives, warts, acne, eczema or change in skin lesion(s). Neuro: No weakness, tremor, incoordination, spasms, paresthesia or pain. Psychiatric: Denies confusion, memory loss or sensory loss. Endo: Denies change in weight, skin or hair change.  Heme/Lymph: No excessive bleeding, bruising or enlarged lymph nodes.  Physical Exam  BP 120/72   Pulse 72   Temp (!) 97 F (36.1 C)   Resp 18   Ht 5\' 9"  (1.753 m)   Wt 266 lb 3.2 oz (120.7 kg)   BMI 39.31 kg/m   Appears  well nourished, well groomed  and in no distress.  Eyes: PERRLA, EOMs, conjunctiva no swelling or erythema. Sinuses: No frontal/maxillary tenderness ENT/Mouth: EAC's clear, TM's nl w/o erythema, bulging. Nares clear w/o erythema, swelling, exudates. Oropharynx clear without erythema or exudates. Oral hygiene is good. Tongue normal, non obstructing.  Hearing intact.  Neck: Supple. Thyroid not palpable. Car 2+/2+ without bruits, nodes or JVD. Chest: Respirations nl with BS clear & equal w/o rales, rhonchi, wheezing or stridor.  Cor: Heart sounds normal w/ regular rate and rhythm without sig. murmurs, gallops, clicks or rubs. Peripheral pulses normal and equal  without edema.  Abdomen: Soft & bowel sounds normal. Non-tender w/o guarding, rebound, hernias, masses or organomegaly.  Lymphatics: Unremarkable.  Musculoskeletal: Full ROM all peripheral extremities, joint stability, 5/5 strength and normal gait.  Skin: Warm, dry without exposed rashes, lesions or ecchymosis apparent.  Neuro: Cranial nerves intact, reflexes equal bilaterally. Sensory-motor testing grossly intact. Tendon reflexes grossly intact.  Pysch: Alert & oriented x 3.  Insight and judgement nl & appropriate. No ideations.  Assessment and Plan:  1. Essential hypertension  - Continue medication, monitor blood pressure at home.  - Continue DASH diet.  Reminder to go to the ER if any CP,  SOB, nausea, dizziness, severe HA, changes vision/speech.  - CBC with Differential/Platelet - COMPLETE METABOLIC PANEL WITH GFR - Magnesium - TSH  2. Hyperlipidemia, mixed  - Continue diet/meds, exercise,& lifestyle modifications.  - Continue monitor periodic cholesterol/liver & renal functions   - Lipid panel - TSH  3. Abnormal glucose  - Continue diet, exercise  - Lifestyle modifications.  - Monitor appropriate labs.  - Hemoglobin A1c - Insulin, random  4. Vitamin D deficiency  - Continue supplementation.  - VITAMIN D 25 Hydroxy  5. ASHD/CABG x 3V (11/2013)  - Lipid panel  6. Paroxysmal atrial fibrillation (HCC)  - TSH  7. Idiopathic gout  - Uric acid  8. Cardiac pacemaker/AICD (01/2014)   9. Hypothyroidism, unspecified type  - TSH  10. Medication management  - CBC with Differential/Platelet - COMPLETE METABOLIC PANEL WITH GFR - Magnesium - Lipid  panel - TSH - Hemoglobin A1c - Insulin, random - VITAMIN D 25 Hydroxy  - Uric acid       Discussed  regular exercise, BP monitoring, weight control to achieve/maintain BMI less than 25 and discussed med and SE's. Recommended labs to assess and monitor clinical status with further disposition pending results of labs.  I discussed the assessment and treatment plan with the patient. The patient was provided an opportunity to ask questions and all were answered. The patient agreed with the plan and demonstrated an understanding of the instructions.  I provided over 30 minutes of exam, counseling, chart  review and  complex critical decision making.   Kirtland Bouchard, MD

## 2019-07-14 ENCOUNTER — Ambulatory Visit: Payer: Self-pay | Admitting: Adult Health

## 2019-07-14 LAB — TSH: TSH: 2.25 mIU/L (ref 0.40–4.50)

## 2019-07-14 LAB — LIPID PANEL
Cholesterol: 149 mg/dL (ref <200)
HDL: 22 mg/dL — ABNORMAL LOW (ref >=40)
LDL Cholesterol (Calc): 98 mg/dL (calc)
Non-HDL Cholesterol (Calc): 127 mg/dL (calc) (ref <130)
Total CHOL/HDL Ratio: 6.8 (calc) — ABNORMAL HIGH (ref <5.0)
Triglycerides: 197 mg/dL — ABNORMAL HIGH (ref <150)

## 2019-07-14 LAB — COMPLETE METABOLIC PANEL WITH GFR
AG Ratio: 1.9 (calc) (ref 1.0–2.5)
ALT: 23 U/L (ref 9–46)
AST: 28 U/L (ref 10–35)
Albumin: 4.3 g/dL (ref 3.6–5.1)
Alkaline phosphatase (APISO): 69 U/L (ref 35–144)
BUN/Creatinine Ratio: 20 (calc) (ref 6–22)
BUN: 24 mg/dL (ref 7–25)
CO2: 31 mmol/L (ref 20–32)
Calcium: 10.2 mg/dL (ref 8.6–10.3)
Chloride: 98 mmol/L (ref 98–110)
Creat: 1.22 mg/dL — ABNORMAL HIGH (ref 0.70–1.18)
GFR, Est African American: 65 mL/min/{1.73_m2} (ref >=60)
GFR, Est Non African American: 56 mL/min/{1.73_m2} — ABNORMAL LOW (ref >=60)
Globulin: 2.3 g/dL (calc) (ref 1.9–3.7)
Glucose, Bld: 136 mg/dL — ABNORMAL HIGH (ref 65–99)
Potassium: 4.4 mmol/L (ref 3.5–5.3)
Sodium: 138 mmol/L (ref 135–146)
Total Bilirubin: 1.2 mg/dL (ref 0.2–1.2)
Total Protein: 6.6 g/dL (ref 6.1–8.1)

## 2019-07-14 LAB — CBC WITH DIFFERENTIAL/PLATELET
Absolute Monocytes: 567 cells/uL (ref 200–950)
Basophils Absolute: 146 cells/uL (ref 0–200)
Basophils Relative: 0.9 %
Eosinophils Absolute: 97 cells/uL (ref 15–500)
Eosinophils Relative: 0.6 %
HCT: 47.7 % (ref 38.5–50.0)
Hemoglobin: 15.6 g/dL (ref 13.2–17.1)
Lymphs Abs: 1652 cells/uL (ref 850–3900)
MCH: 31.1 pg (ref 27.0–33.0)
MCHC: 32.7 g/dL (ref 32.0–36.0)
MCV: 95 fL (ref 80.0–100.0)
MPV: 11.2 fL (ref 7.5–12.5)
Monocytes Relative: 3.5 %
Neutro Abs: 13738 cells/uL — ABNORMAL HIGH (ref 1500–7800)
Neutrophils Relative %: 84.8 %
Platelets: 282 10*3/uL (ref 140–400)
RBC: 5.02 10*6/uL (ref 4.20–5.80)
RDW: 15 % (ref 11.0–15.0)
Total Lymphocyte: 10.2 %
WBC: 16.2 10*3/uL — ABNORMAL HIGH (ref 3.8–10.8)

## 2019-07-14 LAB — HEMOGLOBIN A1C
Hgb A1c MFr Bld: 6 % of total Hgb — ABNORMAL HIGH (ref <5.7)
Mean Plasma Glucose: 126 (calc)
eAG (mmol/L): 7 (calc)

## 2019-07-14 LAB — VITAMIN D 25 HYDROXY (VIT D DEFICIENCY, FRACTURES): Vit D, 25-Hydroxy: 88 ng/mL (ref 30–100)

## 2019-07-14 LAB — INSULIN, RANDOM: Insulin: 56.5 u[IU]/mL — ABNORMAL HIGH

## 2019-07-14 LAB — MAGNESIUM: Magnesium: 1.5 mg/dL (ref 1.5–2.5)

## 2019-07-14 LAB — URIC ACID: Uric Acid, Serum: 6 mg/dL (ref 4.0–8.0)

## 2019-07-26 ENCOUNTER — Other Ambulatory Visit: Payer: Self-pay

## 2019-07-26 ENCOUNTER — Inpatient Hospital Stay: Payer: Medicare Other | Attending: Hematology | Admitting: Hematology

## 2019-07-26 ENCOUNTER — Inpatient Hospital Stay: Payer: Medicare Other

## 2019-07-26 VITALS — BP 130/85 | HR 67 | Temp 97.8°F | Resp 18 | Ht 69.0 in | Wt 267.6 lb

## 2019-07-26 DIAGNOSIS — D72829 Elevated white blood cell count, unspecified: Secondary | ICD-10-CM | POA: Insufficient documentation

## 2019-07-26 DIAGNOSIS — Z79899 Other long term (current) drug therapy: Secondary | ICD-10-CM | POA: Insufficient documentation

## 2019-07-26 DIAGNOSIS — D45 Polycythemia vera: Secondary | ICD-10-CM

## 2019-07-26 DIAGNOSIS — I2510 Atherosclerotic heart disease of native coronary artery without angina pectoris: Secondary | ICD-10-CM | POA: Diagnosis not present

## 2019-07-26 DIAGNOSIS — Z1589 Genetic susceptibility to other disease: Secondary | ICD-10-CM | POA: Diagnosis not present

## 2019-07-26 LAB — CBC WITH DIFFERENTIAL/PLATELET
Abs Immature Granulocytes: 0.17 10*3/uL — ABNORMAL HIGH (ref 0.00–0.07)
Basophils Absolute: 0.1 10*3/uL (ref 0.0–0.1)
Basophils Relative: 1 %
Eosinophils Absolute: 0.1 10*3/uL (ref 0.0–0.5)
Eosinophils Relative: 1 %
HCT: 50.7 % (ref 39.0–52.0)
Hemoglobin: 15.7 g/dL (ref 13.0–17.0)
Immature Granulocytes: 1 %
Lymphocytes Relative: 11 %
Lymphs Abs: 1.7 10*3/uL (ref 0.7–4.0)
MCH: 31.3 pg (ref 26.0–34.0)
MCHC: 31 g/dL (ref 30.0–36.0)
MCV: 101.2 fL — ABNORMAL HIGH (ref 80.0–100.0)
Monocytes Absolute: 0.5 10*3/uL (ref 0.1–1.0)
Monocytes Relative: 3 %
Neutro Abs: 12.7 10*3/uL — ABNORMAL HIGH (ref 1.7–7.7)
Neutrophils Relative %: 83 %
Platelets: 405 10*3/uL — ABNORMAL HIGH (ref 150–400)
RBC: 5.01 MIL/uL (ref 4.22–5.81)
RDW: 16.2 % — ABNORMAL HIGH (ref 11.5–15.5)
WBC: 15.4 10*3/uL — ABNORMAL HIGH (ref 4.0–10.5)
nRBC: 0 % (ref 0.0–0.2)

## 2019-07-26 LAB — CMP (CANCER CENTER ONLY)
ALT: 27 U/L (ref 0–44)
AST: 32 U/L (ref 15–41)
Albumin: 4 g/dL (ref 3.5–5.0)
Alkaline Phosphatase: 73 U/L (ref 38–126)
Anion gap: 6 (ref 5–15)
BUN: 17 mg/dL (ref 8–23)
CO2: 30 mmol/L (ref 22–32)
Calcium: 9.4 mg/dL (ref 8.9–10.3)
Chloride: 101 mmol/L (ref 98–111)
Creatinine: 1.12 mg/dL (ref 0.61–1.24)
GFR, Est AFR Am: >60 mL/min (ref >60)
GFR, Estimated: >60 mL/min (ref >60)
Glucose, Bld: 135 mg/dL — ABNORMAL HIGH (ref 70–99)
Potassium: 4.4 mmol/L (ref 3.5–5.1)
Sodium: 137 mmol/L (ref 135–145)
Total Bilirubin: 1.2 mg/dL (ref 0.3–1.2)
Total Protein: 6.8 g/dL (ref 6.5–8.1)

## 2019-07-26 NOTE — Progress Notes (Signed)
HEMATOLOGY/ONCOLOGY CLINIC NOTE  Date of Service: 07/26/2019  Patient Care Team: [name], MD as PCP - General (Internal Medicine) Georg Ruddle, Ashok Cordia, MD as Referring Physician (Cardiology) Shirlee More Ronalee Red, MD as Referring Physician (Cardiology) Brunetta Genera, MD as Consulting Physician (Hematology)  CHIEF COMPLAINTS/PURPOSE OF CONSULTATION:  F/u for MPN  HISTORY OF PRESENTING ILLNESS:   [name] [name] is a wonderful 79 y.o. male who has been referred to [place] by Dr. [name]  for evaluation and management of Thrombocytosis. He is accompanied today by his wife. The pt reports that he is doing well overall.   The pt reports that he has never had any blood clots.  He has atrial fibrillation and has taken Coumadin since 2015. He had heart surgery in 2015, and had his ICD placed in 2016. The pt also takes 81g aspirin daily.   The pt notes that he has not been aware of his high platelets previously, but only as of his most recent labs. He denies any recent major bleeds, surgeries, and other trauma. He denies having a splenectomy at any time as well.   The pt notes that his right kidney gets sore from time to time when he doesn't stay well hydrated. He also notes that when he urinates at these times, his urine is very dark. The pt adds that he had kidney stones one time. The pt is also taking Lasix and notes that he urinates frequently.   The pt adds that his gums have been very sore, even to touch from his tongue.  The pt notes that he does not want to use a CPAP after a bad experience with a previous sleep study that did diagnose him with sleep apnea. He notes that he falls asleep several times a day as well, falling asleep easily when he sits down, and denying falling asleep while driving. He also notes some increased difficulty remembering.   Most recent lab results (12/09/17) of CBC is as follows: all values are WNL except for WBC at 11.9k, RDW at 18.4, PLT at  649k, ANC at 9.7k.  On review of systems, pt reports good energy levels, falling asleep several times a day, staying active, and denies recent surgery, recent trauma, bleeding, unexpected weight loss, pain along the spine, abdominal pains, and any other symptoms.   On Family Hx the pt reports prostate cancer.   Interval History:   [name] [name] returns today for management and evaluation of his JAK2 positive MPN. The patient's last visit with [place] was on 04/26/2019. The pt reports that he is doing well overall.  The pt reports that he has been having some receding of his gums and is unsure of the cause. He still has some low-level leg swelling that has been stable. He had no lightheadedness or dizziness after his last phlebotomy.   Pt has had both doses of the COVID19 vaccine and denies any concerns or symptoms.   Lab results today (07/26/19) of CBC w/diff and CMP is as follows: all values are WNL except for WBC at 15.4K, MCV at 101.2, RDW at 16.2, PLT at 405K, Neutro Abs at 12.7K, Abs Immature Granulocytes at 0.17K, Glucose at 135.  On review of systems, pt reports leg swelling, receding gums and denies lightheadedness, dizziness, SOB, chest pain, vision changes and any other symptoms.    MEDICAL HISTORY:  Past Medical History:  Diagnosis Date  . A-fib ([place]) 01/2014  . AICD (automatic cardioverter/defibrillator) present    [employer]  . Arthritis   . ASHD (arteriosclerotic heart disease) 2015   s/p CABG  . Cataract    s/p left  . COPD (chronic obstructive pulmonary disease) ([place])    by CXR, pt unsure of this  . GERD (gastroesophageal reflux disease)   . Gout 2014  . Hyperlipidemia   . Hypertension   . Hypothyroidism   . LBBB (left bundle branch block)   . Pre-diabetes   . Prediabetes 01/2014  . Presence of permanent cardiac pacemaker   . Sleep apnea    no CPAP  . Thyroid disease    hypothyroid    SURGICAL HISTORY: Past Surgical History:  Procedure Laterality  Date  . CARDIAC DEFIBRILLATOR PLACEMENT  07/2014  . CATARACT EXTRACTION W/ INTRAOCULAR LENS IMPLANT Left   . CORONARY ARTERY BYPASS GRAFT  11/2013  . lumb laminectomy  [phone]   x 3  . LUMBAR FUSION  2013  . NASAL SEPTOPLASTY W/ TURBINOPLASTY Bilateral 01/30/2016   Procedure: NASAL SEPTOPLASTY WITH BILATERAL TURBINATE REDUCTION;  Surgeon: Rozetta Nunnery, MD;  Location: [facility];  Service: ENT;  Laterality: Bilateral;  . UVULECTOMY N/A 01/30/2016   Procedure: UVULECTOMY;  Surgeon: Rozetta Nunnery, MD;  Location[facility];  Service: ENT;  Laterality: N/A;    SOCIAL HISTORY: Social History   Socioeconomic History  . Marital status: Married    Spouse name: [name]   . Number of children: Not on file  . Years of education: 40  . Highest education level: Not on file  Occupational History  . Occupation: Retired  Tobacco Use  . Smoking status: Former Smoker    Packs/day: 2.00    Years: 25.00    Pack years: 50.00    Types: Cigarettes    Quit date: 05/20/1983    Years since quitting: 36.2  . Smokeless tobacco: Never Used  Substance and Sexual Activity  . Alcohol use: Not Currently    Alcohol/week: 0.0 standard drinks  . Drug use: No  . Sexual activity: Not on file  Other Topics Concern  . Not on file  Social History Narrative   Lives with wife, [name]   Caffeine use: daily (Coffee)   Social Determinants of Health   Financial Resource Strain:   . Difficulty of Paying Living Expenses: Not on file  Food Insecurity:   . Worried About Charity fundraiser in the Last Year: Not on file  . Ran Out of Food in the Last Year: Not on file  Transportation Needs:   . Lack of Transportation (Medical): Not on file  . Lack of Transportation (Non-Medical): Not on file  Physical Activity:   . Days of Exercise per Week: Not on file  . Minutes of Exercise per Session: Not on file  Stress:   . Feeling of Stress : Not on file  Social Connections:   . Frequency of Communication with  Friends and Family: Not on file  . Frequency of Social Gatherings with Friends and Family: Not on file  . Attends Religious Services: Not on file  . Active Member of Clubs or Organizations: Not on file  . Attends Archivist Meetings: Not on file  . Marital Status: Not on file  Intimate Partner Violence:   . Fear of Current or Ex-Partner: Not on file  . Emotionally Abused: Not on file  . Physically Abused: Not on file  . Sexually Abused: Not on file    FAMILY HISTORY: Family History  Problem Relation Age of Onset  .  Alzheimer's disease Mother   . Mental illness Mother   . Depression Mother   . Heart disease Father 22       had 5 MI's & died 2 yo  . Alcohol abuse Son   . Stroke Maternal Grandmother     ALLERGIES:  is allergic to other.  MEDICATIONS:  Current Outpatient Medications  Medication Sig Dispense Refill  . acetaminophen (TYLENOL) 500 MG tablet Take 500 mg by mouth every 6 (six) hours as needed for mild pain.     Marland Kitchen albuterol (PROVENTIL) (2.5 MG/3ML) 0.083% nebulizer solution USE 1 VIAL IN NEBULIZER EVERY 6 HOURS AS NEEDED FOR WHEEZING OR SHORTNESS OF BREATH 75 mL 0  . allopurinol (ZYLOPRIM) 300 MG tablet Take 1 tablet Daily to prevent Gout 90 tablet 3  . aspirin EC 81 MG tablet Take 81 mg by mouth daily.    Marland Kitchen atorvastatin (LIPITOR) 40 MG tablet Take 1 tablet Daily for Cholesterol 90 tablet 3  . Black Pepper-Turmeric (TURMERIC COMPLEX/BLACK PEPPER PO) Take 1,000 mg by mouth daily.     . Cholecalciferol (VITAMIN D PO) Take 10,000 Units by mouth daily.     . diphenhydrAMINE (BENADRYL) 25 MG tablet Take 25 mg by mouth at bedtime.    . diphenhydrAMINE (SOMINEX) 25 MG tablet Take by mouth.    . docusate sodium (COLACE) 100 MG capsule Take 100 mg by mouth 2 (two) times daily. 1 tablet in the morning and 1 tablet at bedtime    . doxazosin (CARDURA) 4 MG tablet TAKE 1 TABLET EVERY DAY 90 tablet 3  . finasteride (PROSCAR) 5 MG tablet Take 1 tablet Daily for Prostate 90  tablet 3  . furosemide (LASIX) 40 MG tablet Take 1 tablet (40 mg total) by mouth daily. (Patient taking differently: Take 40 mg by mouth 2 (two) times daily. ) 90 tablet 3  . hydroxyurea (HYDREA) 500 MG capsule 1000 mg (2 caps) po daily .Take with food. 120 capsule 3  . levothyroxine (SYNTHROID) 200 MCG tablet Take 1 tablet daily on an empty stomach with only water for 30 minutes & no Antacid meds, Calcium or Magnesium for 4 hours & avoid Biotin 90 tablet 3  . losartan (COZAAR) 100 MG tablet Take 100 mg by mouth daily.    . Magnesium 400 MG TABS Take 1 tablet by mouth daily.    . magnesium oxide (MAG-OX) 400 MG tablet Take by mouth.    . metoprolol (TOPROL-XL) 200 MG 24 hr tablet Take 200 mg by mouth daily.    . Omega-3 Fatty Acids (FISH OIL) 1000 MG CAPS Take by mouth.    Marland Kitchen omeprazole (PRILOSEC) 20 MG capsule TAKE 1 CAPSULE TWICE DAILY 180 capsule 3  . OVER THE COUNTER MEDICATION daily. Vitamin A to Z 50 plus    . OVER THE COUNTER MEDICATION DHA 500 mg capsule daily.    . polyethylene glycol (MIRALAX / GLYCOLAX) packet Take 17 g by mouth daily.    . prednisoLONE acetate (PRED FORTE) 1 % ophthalmic suspension     . spironolactone (ALDACTONE) 25 MG tablet Take 1 tablet Daily for Heart & BP 90 tablet 3  . warfarin (COUMADIN) 5 MG tablet Take 1 to 2 tablets Daily as Directed (Patient taking differently: 7.5 mg. Take 1 to 2 tablets Daily as Directed Pt stated he is taking 7.5 of coumadin 04/26/19) 145 tablet 3  . zinc gluconate 50 MG tablet Take 50 mg by mouth daily.     No current facility-administered medications  for this visit.    REVIEW OF SYSTEMS:   A 10+ POINT REVIEW OF SYSTEMS WAS OBTAINED including neurology, dermatology, psychiatry, cardiac, respiratory, lymph, extremities, GI, GU, Musculoskeletal, constitutional, breasts, reproductive, HEENT.  All pertinent positives are noted in the HPI.  All others are negative.   PHYSICAL EXAMINATION: . Vitals:   07/26/19 1408  BP: 130/85  Pulse:  67  Resp: 18  Temp: 97.8 F (36.6 C)  SpO2: 98%   Filed Weights   07/26/19 1408  Weight: 267 lb 9.6 oz (121.4 kg)   .Body mass index is 39.52 kg/m.   Exam was given in a chair   GENERAL:alert, in no acute distress and comfortable SKIN: no acute rashes, no significant lesions EYES: conjunctiva are pink and non-injected, sclera anicteric OROPHARYNX: MMM, no exudates, no oropharyngeal erythema or ulceration NECK: supple, no JVD LYMPH:  no palpable lymphadenopathy in the cervical, axillary or inguinal regions LUNGS: clear to auscultation b/l with normal respiratory effort HEART: regular rate & rhythm ABDOMEN:  normoactive bowel sounds , non tender, not distended. No palpable hepatosplenomegaly.  Extremity: 1+ pedal edema b/l PSYCH: alert & oriented x 3 with fluent speech NEURO: no focal motor/sensory deficits  LABORATORY DATA:  I have reviewed the data as listed  . CBC Latest Ref Rng & Units 07/26/2019 07/13/2019 04/26/2019  WBC 4.0 - 10.5 K/uL 15.4(H) 16.2(H) 15.6(H)  Hemoglobin 13.0 - 17.0 g/dL 15.7 15.6 16.1  Hematocrit 39.0 - 52.0 % 50.7 47.7 51.7  Platelets 150 - 400 K/uL 405(H) 282 372    . CMP Latest Ref Rng & Units 07/26/2019 07/13/2019 04/26/2019  Glucose 70 - 99 mg/dL 135(H) 136(H) 153(H)  BUN 8 - 23 mg/dL 17 24 18   Creatinine 0.61 - 1.24 mg/dL 1.12 1.22(H) 1.25(H)  Sodium 135 - 145 mmol/L 137 138 141  Potassium 3.5 - 5.1 mmol/L 4.4 4.4 4.0  Chloride 98 - 111 mmol/L 101 98 101  CO2 22 - 32 mmol/L 30 31 30   Calcium 8.9 - 10.3 mg/dL 9.4 10.2 9.1  Total Protein 6.5 - 8.1 g/dL 6.8 6.6 6.9  Total Bilirubin 0.3 - 1.2 mg/dL 1.2 1.2 1.0  Alkaline Phos 38 - 126 U/L 73 - 72  AST 15 - 41 U/L 32 28 30  ALT 0 - 44 U/L 27 23 27    . Lab Results  Component Value Date   LDH 442 (H) 12/21/2018    02/09/18 JAK2 Mutation Study:    02/09/18 BCR ABL FISH:    RADIOGRAPHIC STUDIES: I have personally reviewed the radiological images as listed and agreed with the findings in  the report. No results found.  ASSESSMENT & PLAN:   79 y.o. male with  1. 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 Some associated leukocytosis. No history of splenectomy. No previous h/o VTE, but significant cardiac risk factors. 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   02/09/18 BCR ABL FISH revealed no evidence of BCR/ABL rearrangement   PLAN: -Discussed pt labwork today, 07/26/19; HCT slightly higher at 50.7 (but hgb not significantly elevated), PLT close to goal, blood chemistries look good.  -Goal for PLT between 200k and 400k -No indication for a therapeutic phlebotomy today.  -Will tentatively set up a therapeutic phlebotomy in three months  -The  pt has no prohibitive toxicities from continuing 1000mg  Hydroxyurea 7 days/week at this time -Will see back in 3 months with labs   2. . Patient Active Problem List   Diagnosis Date Noted  . Memory changes 01/10/2019  . Polycythemia vera ([place]) 10/21/2018  . Thrombocytosis ([place]) 12/29/2017  . Senile purpura ([place]) 09/01/2017  . Emphysema of lung ([place]) 06/01/2017  . Tortuous aorta ([facility]) 06/01/2017  . ASHD/CABG x 3V (11/2013) 02/06/2015  . Encounter for monitoring Coumadin therapy 02/06/2015  . Cardiac pacemaker/AICD (01/2014) 02/06/2015  . Morbid obesity ([place]) 01/03/2015  . HTN (hypertension) 01/02/2015  . Hyperlipidemia, mixed 01/02/2015  . Prediabetes 01/02/2015  . Vitamin D deficiency 01/02/2015  . GERD  01/02/2015  . BPH (benign prostatic hyperplasia) 01/02/2015  . Medication management 01/02/2015  . Atrial fibrillation ([place]) 01/02/2015  . Hypothyroidism 01/02/2015  . OSA (obstructive sleep apnea) 01/02/2015  -continue f/u with PCP to optimize  atherosclerotic risk factors.   FOLLOW UP: RTC with Dr Irene Limbo with labs and appointment for therapeutic phlebotomy in 3 months    The total time spent in the appt was 15 minutes and more than 50% was on counseling and direct patient cares.  All of the patient's questions were answered with apparent satisfaction. The patient knows to call the clinic with any problems, questions or concerns.   Sullivan Lone MD [place] AAHIVMS [facility] Hematology/Oncology Physician [facility]  (Office):       [phone] (Work cell):  [phone] (Fax):           [phone]  07/26/2019 2:38 PM  I, Yevette Edwards, am acting as a scribe for Dr. Sullivan Lone.   .I have reviewed the above documentation for accuracy and completeness, and I agree with the above. Brunetta Genera MD

## 2019-07-27 DIAGNOSIS — I480 Paroxysmal atrial fibrillation: Secondary | ICD-10-CM | POA: Diagnosis not present

## 2019-07-27 DIAGNOSIS — I255 Ischemic cardiomyopathy: Secondary | ICD-10-CM | POA: Diagnosis not present

## 2019-07-27 DIAGNOSIS — I2510 Atherosclerotic heart disease of native coronary artery without angina pectoris: Secondary | ICD-10-CM | POA: Diagnosis not present

## 2019-07-27 DIAGNOSIS — I5022 Chronic systolic (congestive) heart failure: Secondary | ICD-10-CM | POA: Diagnosis not present

## 2019-07-29 ENCOUNTER — Telehealth: Payer: Self-pay | Admitting: Hematology

## 2019-07-29 NOTE — Telephone Encounter (Signed)
Scheduled per 03/09 los, patient has been called and notified. 

## 2019-08-02 DIAGNOSIS — I255 Ischemic cardiomyopathy: Secondary | ICD-10-CM | POA: Diagnosis not present

## 2019-08-02 DIAGNOSIS — R0601 Orthopnea: Secondary | ICD-10-CM | POA: Diagnosis not present

## 2019-08-02 DIAGNOSIS — Z7901 Long term (current) use of anticoagulants: Secondary | ICD-10-CM | POA: Diagnosis not present

## 2019-08-02 DIAGNOSIS — Z95810 Presence of automatic (implantable) cardiac defibrillator: Secondary | ICD-10-CM | POA: Diagnosis not present

## 2019-08-02 DIAGNOSIS — I480 Paroxysmal atrial fibrillation: Secondary | ICD-10-CM | POA: Diagnosis not present

## 2019-08-02 DIAGNOSIS — I10 Essential (primary) hypertension: Secondary | ICD-10-CM | POA: Diagnosis not present

## 2019-08-11 DIAGNOSIS — I255 Ischemic cardiomyopathy: Secondary | ICD-10-CM | POA: Diagnosis not present

## 2019-08-30 DIAGNOSIS — I480 Paroxysmal atrial fibrillation: Secondary | ICD-10-CM | POA: Diagnosis not present

## 2019-08-30 DIAGNOSIS — I255 Ischemic cardiomyopathy: Secondary | ICD-10-CM | POA: Diagnosis not present

## 2019-08-30 DIAGNOSIS — Z7901 Long term (current) use of anticoagulants: Secondary | ICD-10-CM | POA: Diagnosis not present

## 2019-08-30 DIAGNOSIS — Z95810 Presence of automatic (implantable) cardiac defibrillator: Secondary | ICD-10-CM | POA: Diagnosis not present

## 2019-09-08 ENCOUNTER — Other Ambulatory Visit: Payer: Self-pay | Admitting: Hematology

## 2019-09-27 DIAGNOSIS — I255 Ischemic cardiomyopathy: Secondary | ICD-10-CM | POA: Diagnosis not present

## 2019-09-27 DIAGNOSIS — Z95810 Presence of automatic (implantable) cardiac defibrillator: Secondary | ICD-10-CM | POA: Diagnosis not present

## 2019-09-27 DIAGNOSIS — I480 Paroxysmal atrial fibrillation: Secondary | ICD-10-CM | POA: Diagnosis not present

## 2019-09-27 DIAGNOSIS — Z7901 Long term (current) use of anticoagulants: Secondary | ICD-10-CM | POA: Diagnosis not present

## 2019-10-11 ENCOUNTER — Ambulatory Visit: Payer: Self-pay | Admitting: Physician Assistant

## 2019-10-11 ENCOUNTER — Ambulatory Visit: Payer: Self-pay | Admitting: Adult Health

## 2019-10-11 DIAGNOSIS — M7751 Other enthesopathy of right foot: Secondary | ICD-10-CM | POA: Diagnosis not present

## 2019-10-12 DIAGNOSIS — R7309 Other abnormal glucose: Secondary | ICD-10-CM | POA: Insufficient documentation

## 2019-10-12 NOTE — Progress Notes (Signed)
MEDICARE ANNUAL WELLNESS VISIT AND FOLLOW UP Assessment:    Encounter for Medicare annual wellness exam 1 year  Tortuous aorta ([place]) Control blood pressure, cholesterol, glucose, increase exercise.   Senile purpura (HCC) Discussed process, protect skin, sunscreen  OSA and COPD overlap syndrome (HCC) Didn't tolerate CPAP, symptoms improved with sinus surgery, weight loss, sleeping in recliner weight loss still advised.   Pulmonary emphysema, unspecified emphysema type ([place]) -     DG Chest 2 View; Future - Not candidate for screening CT CXR annually, monitor closely  Doing well with rare PRN albuterol   Acquired thrombophilia ([place]) -     CBC with Differential/Platelet -     Protime-INR- with rectal bleeding will check today  Other hemorrhoids -     Ambulatory referral to Gastroenterology -     HYDROCORTISONE ACE, RECTAL, 30 MG SUPP; Place 1 suppository (30 mg total) rectally 2 (two) times daily as needed. -     Protime-INR - + hemoccult card in the office, very difficult exam, very tender, will refer to GI for evaulation  Essential hypertension At goal; continue medications Monitor blood pressure at home; call if consistently over 130/80 Continue DASH diet.   Reminder to go to the ER if any CP, SOB, nausea, dizziness, severe HA, changes vision/speech, left arm numbness and tingling and jaw pain.  Paroxysmal atrial fibrillation ([place]) Continue follow up with a fib clinic for coumadin management Discussed if patient falls to immediately contact office or go to ER. Discussed foods that can increase or decrease Coumadin levels. Patient understands to call the office before starting a new medication.  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 Followed by cardiology    Gastroesophageal reflux disease, esophagitis presence not specified Well managed on current medications Discussed diet, avoiding triggers and other lifestyle  changes  Hypothyroidism, unspecified type continue medications the same pending labs reminded to take on an empty stomach 30-79mins before food, 4+ hours apart from reflux meds -     TSH  Benign prostatic hyperplasia with lower urinary tract symptoms, symptom details unspecified Continue medications; symptoms well controlled Check annual PSA; urology referral as indicated  Mixed hyperlipidemia At goal for LDL; continue statin- discussed diet for borderline trigs Continue low cholesterol diet and exercise.  -     Lipid panel  Abnormal glucose Discussed disease and risks of elevated glucose Discussed diet/exercise, weight management  -     Hemoglobin A1c  Vitamin D deficiency At goal; continue supplementation Defer vitamin D level  Medication management -     CBC with Differential/Platelet -     CMP/GFR -     Magnesium  Morbid obesity ([place]) Long discussion about weight loss, diet, and exercise Recommended diet heavy in fruits and veggies and low in animal meats, cheeses, and dairy products, appropriate calorie intake Discussed appropriate weight for height, goal of <250lb set, he will restart with exercise, encouraged him to cut back on snacking, to watch portions Follow up in 3 months  Cardiac pacemaker/AICD (01/2014) Continue follow up with cardiology  Thrombocytosis Followed by Dr. Irene Limbo, on hydroxyurea 500 mg  Monitor CBC  Over 30 minutes of exam, counseling, chart review, and critical decision making was performed  Future Appointments  Date Time Provider [place]  10/26/2019  9:45 AM [facility] LAB 6 [facility] None  10/26/2019 10:20 AM Brunetta Genera, MD [facility] None  10/26/2019 11:30 AM [facility] INFUSION [facility] None  01/11/2020 11:00 AM [name], NP [facility] None  05/09/2020  2:00 PM [name], MD [facility] None     Plan:   During the course of the visit the patient was educated and counseled about appropriate  screening and preventive services including:    Pneumococcal vaccine   Influenza vaccine  Prevnar 13  Td vaccine  Screening electrocardiogram  Colorectal cancer screening  Diabetes screening  Glaucoma screening  Nutrition counseling    Subjective:  [name] [name] is a 79 y.o. male who presents for Medicare Annual Wellness Visit and 3 month follow up for HTN, hyperlipidemia, prediabetes, and vitamin D Def.    In 2015, he underwent a CABG x 3 V w/po Afib- and failed 2 attempts at VCV. Then in 07/2014, he had a BiVent, AICD/Defib/PM planted. He is followed at his Cardiologist (Dr. Georg Ruddle) coumadin clinic.  Patient denies any cardiac symptoms as chest pain, palpitations, shortness of breath, dizziness or ankle swelling.  He has dx of sleep apnea but did not tolerate CPAP.   Lab Results  Component Value Date   INR 3.4 (H) 10/18/2019   INR 3.1 (H) 02/07/2016   INR 1.27 01/30/2016    he has a diagnosis of GERD which is currently managed by prilosec 20 mg once daily prior to dinner he reports symptoms is currently well controlled, and denies breakthrough reflux, burning in chest, hoarseness or cough.   He has history of hemorrhoids, worse with constipation, will have blood with every BM. No pain with wiping.  Last colonoscopy 2008, cologuard neg 2019  BMI is Body mass index is 39.72 kg/m., he has been working on diet and exercise, trying to start working out at home more, has treadmill and weight machines.  Wt Readings from Last 3 Encounters:  10/18/19 269 lb (122 kg)  07/26/19 267 lb 9.6 oz (121.4 kg)  07/13/19 266 lb 3.2 oz (120.7 kg)   His blood pressure has been controlled at home, today their BP is BP: 130/80 He does workout. He denies chest pain, shortness of breath, dizziness.   He is on cholesterol medication (atorvastatin 40 mg daily) and denies myalgias. His LDL cholesterol is at goal. The cholesterol last visit was:   Lab Results  Component Value Date   CHOL  140 10/18/2019   HDL 24 (L) 10/18/2019   LDLCALC 92 10/18/2019   TRIG 138 10/18/2019   CHOLHDL 5.8 (H) 10/18/2019   He has been working on diet and exercise for prediabetes, and denies increased appetite, nausea, paresthesia of the feet, polydipsia, polyuria, visual disturbances, vomiting and weight loss. Last A1C in the office was:  Lab Results  Component Value Date   HGBA1C 5.7 (H) 10/18/2019   He is on thyroid medication. His medication was not changed last visit. He is taking levothyroxine 200 mcg, has been taking 30 min prior to other food or meds since last vist   Lab Results  Component Value Date   TSH 2.75 10/18/2019   Last GFR Lab Results  Component Value Date   GFRNONAA 64 10/18/2019   Patient is on Vitamin D supplement and at goal at recent check:    Lab Results  Component Value Date   VD25OH 76 10/18/2019     Patient is on allopurinol for gout and does not report a recent flare.  Lab Results  Component Value Date   LABURIC 6.0 07/13/2019   Lab Results  Component Value Date   PSA 0.1 04/12/2019   PSA 0.1 03/22/2018   PSA 0.1 02/18/2017    Medication  Review:  Current Outpatient Medications (Endocrine & Metabolic):  .  levothyroxine (SYNTHROID) 200 MCG tablet, Take 1 tablet daily on an empty stomach with only water for 30 minutes & no Antacid meds, Calcium or Magnesium for 4 hours & avoid Biotin  Current Outpatient Medications (Cardiovascular):  .  atorvastatin (LIPITOR) 40 MG tablet, Take 1 tablet Daily for Cholesterol .  doxazosin (CARDURA) 4 MG tablet, TAKE 1 TABLET EVERY DAY .  furosemide (LASIX) 40 MG tablet, Take 1 tablet (40 mg total) by mouth daily. (Patient taking differently: Take 40 mg by mouth 2 (two) times daily. ) .  losartan (COZAAR) 100 MG tablet, Take 100 mg by mouth daily. .  metoprolol (TOPROL-XL) 200 MG 24 hr tablet, Take 200 mg by mouth daily. Marland Kitchen  spironolactone (ALDACTONE) 25 MG tablet, Take 1 tablet Daily for Heart & BP  Current  Outpatient Medications (Respiratory):  .  albuterol (PROVENTIL) (2.5 MG/3ML) 0.083% nebulizer solution, USE 1 VIAL IN NEBULIZER EVERY 6 HOURS AS NEEDED FOR WHEEZING OR SHORTNESS OF BREATH .  diphenhydrAMINE (BENADRYL) 25 MG tablet, Take 25 mg by mouth at bedtime.  Current Outpatient Medications (Analgesics):  .  acetaminophen (TYLENOL) 500 MG tablet, Take 500 mg by mouth every 6 (six) hours as needed for mild pain.  Marland Kitchen  allopurinol (ZYLOPRIM) 300 MG tablet, Take 1 tablet Daily to prevent Gout .  aspirin EC 81 MG tablet, Take 81 mg by mouth daily.  Current Outpatient Medications (Hematological):  .  warfarin (COUMADIN) 5 MG tablet, Take 1 to 2 tablets Daily as Directed (Patient taking differently: 7.5 mg. Take 1 to 2 tablets Daily as Directed Pt stated he is taking 7.5 of coumadin 04/26/19)  Current Outpatient Medications (Other):  Marland Kitchen  Black Pepper-Turmeric (TURMERIC COMPLEX/BLACK PEPPER PO), Take 1,000 mg by mouth daily.  .  Cholecalciferol (VITAMIN D PO), Take 10,000 Units by mouth daily.  .  diphenhydrAMINE (SOMINEX) 25 MG tablet, Take by mouth. .  docusate sodium (COLACE) 100 MG capsule, Take 100 mg by mouth 2 (two) times daily. 1 tablet in the morning and 1 tablet at bedtime .  finasteride (PROSCAR) 5 MG tablet, Take 1 tablet Daily for Prostate .  hydroxyurea (HYDREA) 500 MG capsule, TAKE 2 CAPSULES ONE TIME DAILY WITH FOOD .  Magnesium 400 MG TABS, Take 1 tablet by mouth daily. .  magnesium oxide (MAG-OX) 400 MG tablet, Take by mouth. .  Omega-3 Fatty Acids (FISH OIL) 1000 MG CAPS, Take by mouth. Marland Kitchen  omeprazole (PRILOSEC) 20 MG capsule, TAKE 1 CAPSULE TWICE DAILY .  OVER THE COUNTER MEDICATION, daily. Vitamin A to Z 50 plus .  OVER THE COUNTER MEDICATION, DHA 500 mg capsule daily. .  polyethylene glycol (MIRALAX / GLYCOLAX) packet, Take 17 g by mouth daily. .  prednisoLONE acetate (PRED FORTE) 1 % ophthalmic suspension,  .  zinc gluconate 50 MG tablet, Take 50 mg by mouth daily. Marland Kitchen   HYDROCORTISONE ACE, RECTAL, 30 MG SUPP, Place 1 suppository (30 mg total) rectally 2 (two) times daily as needed.  Allergies: Allergies  Allergen Reactions  . Other Swelling    SODIUM SENSITIVE    Current Problems (verified) has HTN (hypertension); Hyperlipidemia, mixed; Vitamin D deficiency; GERD ; BPH (benign prostatic hyperplasia); Medication management; Atrial fibrillation ([place]); Hypothyroidism; OSA and COPD overlap syndrome ([place]); Morbid obesity ([place]); ASHD/CABG x 3V (11/2013); Acquired thrombophilia ([place]); Cardiac pacemaker/AICD (01/2014); Emphysema of lung ([place]); Tortuous aorta ([place]); Senile purpura ([place]); Thrombocytosis ([place]); Polycythemia vera ([place]); Memory changes; and Abnormal  glucose on their problem list.  Screening Tests Health Maintenance  Topic Date Due  . TETANUS/TDAP  Never done  . INFLUENZA VACCINE  12/18/2019  . COVID-19 Vaccine  Completed  . PNA vac Low Risk Adult  Completed   Preventative care: Last colonoscopy: 2008  Cologuard: 10/2016 neg DUE will send to house ECHO: 2018 CXR: 05/2017 - emphysematous changes, 50 pack year smoking history, not a candidate for low dose CT, quit greater than 15 years ago  Prior vaccinations: TD or Tdap: Remote;  declines Influenza: 2020  Pneumococcal: 2019 Prevnar13: 2016 Shingles/Zostavax: Zostavax can't recall when  Names of Other Physician/Practitioners you currently use: 1. [facility] here for primary care 2. Dr. Vangie Bicker, eye doctor, last visit 2020 3. New dentist in [facility], Caryn Bee, dentist, last visit 2020  Patient Care Team: [name], MD as PCP - General (Internal Medicine) Georg Ruddle, Ashok Cordia, MD as Referring Physician (Cardiology) Shirlee More Ronalee Red, MD as Referring Physician (Cardiology) Brunetta Genera, MD as Consulting Physician (Hematology)  Surgical: He  has a past surgical history that includes Coronary artery bypass graft (11/2013); lumb  laminectomy [id number]); Lumbar fusion (2013); Cardiac defibrillator placement (07/2014); Cataract extraction w/ intraocular lens implant (Left); Nasal septoplasty w/ turbinoplasty (Bilateral, 01/30/2016); and Uvulectomy (N/A, 01/30/2016). Family His family history includes Alcohol abuse in his son; Alzheimer's disease in his mother; Depression in his mother; Heart disease (age of onset: 51) in his father; Mental illness in his mother; Stroke in his maternal grandmother. Social history  He reports that he quit smoking about 36 years ago. His smoking use included cigarettes. He has a 50.00 pack-year smoking history. He has never used smokeless tobacco. He reports previous alcohol use. He reports that he does not use drugs.  MEDICARE WELLNESS OBJECTIVES: Physical activity:   Cardiac risk factors:   Depression/mood screen:   Depression screen [facility] 2/9 07/13/2019  Decreased Interest 0  Down, Depressed, Hopeless 0  PHQ - 2 Score 0    ADLs:  In your present state of health, do you have any difficulty performing the following activities: 04/11/2019  Hearing? N  Vision? N  Difficulty concentrating or making decisions? N  Walking or climbing stairs? N  Dressing or bathing? N  Doing errands, shopping? N  Some recent data might be hidden     Cognitive Testing  Alert? Yes  Normal Appearance?Yes  Oriented to person? Yes  Place? Yes   Time? Yes  Recall of three objects?  2/3  Can perform simple calculations? Yes  Displays appropriate judgment?Yes  Can read the correct time from a watch face?Yes  EOL planning: Does Patient Have a Medical Advance Directive?: Yes Type of Advance Directive: Healthcare Power of Attorney, Living will [place] in Chart?: No - copy requested Yes  Objective:   Today's Vitals   10/18/19 1551  BP: 130/80  Pulse: 63  Temp: (!) 97.5 F (36.4 C)  SpO2: 95%  Weight: 269 lb (122 kg)  Height: 5\' 9"  (1.753 m)   Body mass index is 39.72  kg/m.  General appearance: alert, obese, no distress, WD/WN, male HEENT: normocephalic, sclerae anicteric, TMs pearly, nares patent, no discharge or erythema, pharynx normal Oral cavity: MMM, no lesions Neck: supple, no lymphadenopathy, no thyromegaly, no masses Heart: RRR, normal S1, S2, mild 2/6 systolic murmur Lungs: Present throughout, symmetrical, No rhonchi, or rales or wheezing Abdomen: +bs, soft, non tender, non distended, no masses, no hepatomegaly, no splenomegaly Musculoskeletal:  nontender, no swelling, no obvious deformity Extremities: no edema, no cyanosis, no clubbing Pulses: 2+ symmetric, upper and lower extremities, normal cap refill Neurological: alert, oriented x 3, CN2-12 intact, strength normal upper extremities and lower extremities, sensation normal throughout, DTRs 2+ throughout, no cerebellar signs, gait normal Psychiatric: normal affect, behavior normal, pleasant   Medicare Attestation I have personally reviewed: The patient's medical and social history Their use of alcohol, tobacco or illicit drugs Their current medications and supplements The patient's functional ability including ADLs,fall risks, home safety risks, cognitive, and hearing and visual impairment Diet and physical activities Evidence for depression or mood disorders  The patient's weight, height, BMI, and visual acuity have been recorded in the chart.  I have made referrals, counseling, and provided education to the patient based on review of the above and I have provided the patient with a written personalized care plan for preventive services.     Vicie Mutters, PA-C   10/19/2019

## 2019-10-18 ENCOUNTER — Other Ambulatory Visit: Payer: Self-pay

## 2019-10-18 ENCOUNTER — Encounter: Payer: Self-pay | Admitting: Physician Assistant

## 2019-10-18 ENCOUNTER — Ambulatory Visit (INDEPENDENT_AMBULATORY_CARE_PROVIDER_SITE_OTHER): Payer: Medicare Other | Admitting: Physician Assistant

## 2019-10-18 VITALS — BP 130/80 | HR 63 | Temp 97.5°F | Ht 69.0 in | Wt 269.0 lb

## 2019-10-18 DIAGNOSIS — Z0001 Encounter for general adult medical examination with abnormal findings: Secondary | ICD-10-CM | POA: Diagnosis not present

## 2019-10-18 DIAGNOSIS — G4733 Obstructive sleep apnea (adult) (pediatric): Secondary | ICD-10-CM

## 2019-10-18 DIAGNOSIS — Z7901 Long term (current) use of anticoagulants: Secondary | ICD-10-CM | POA: Diagnosis not present

## 2019-10-18 DIAGNOSIS — D45 Polycythemia vera: Secondary | ICD-10-CM | POA: Diagnosis not present

## 2019-10-18 DIAGNOSIS — J439 Emphysema, unspecified: Secondary | ICD-10-CM

## 2019-10-18 DIAGNOSIS — J449 Chronic obstructive pulmonary disease, unspecified: Secondary | ICD-10-CM

## 2019-10-18 DIAGNOSIS — Z0000 Encounter for general adult medical examination without abnormal findings: Secondary | ICD-10-CM

## 2019-10-18 DIAGNOSIS — Z79899 Other long term (current) drug therapy: Secondary | ICD-10-CM | POA: Diagnosis not present

## 2019-10-18 DIAGNOSIS — I10 Essential (primary) hypertension: Secondary | ICD-10-CM | POA: Diagnosis not present

## 2019-10-18 DIAGNOSIS — E782 Mixed hyperlipidemia: Secondary | ICD-10-CM

## 2019-10-18 DIAGNOSIS — N401 Enlarged prostate with lower urinary tract symptoms: Secondary | ICD-10-CM | POA: Diagnosis not present

## 2019-10-18 DIAGNOSIS — I771 Stricture of artery: Secondary | ICD-10-CM

## 2019-10-18 DIAGNOSIS — E039 Hypothyroidism, unspecified: Secondary | ICD-10-CM | POA: Diagnosis not present

## 2019-10-18 DIAGNOSIS — Z5181 Encounter for therapeutic drug level monitoring: Secondary | ICD-10-CM | POA: Diagnosis not present

## 2019-10-18 DIAGNOSIS — R7309 Other abnormal glucose: Secondary | ICD-10-CM

## 2019-10-18 DIAGNOSIS — R6889 Other general symptoms and signs: Secondary | ICD-10-CM

## 2019-10-18 DIAGNOSIS — D473 Essential (hemorrhagic) thrombocythemia: Secondary | ICD-10-CM

## 2019-10-18 DIAGNOSIS — E559 Vitamin D deficiency, unspecified: Secondary | ICD-10-CM

## 2019-10-18 DIAGNOSIS — Z950 Presence of cardiac pacemaker: Secondary | ICD-10-CM

## 2019-10-18 DIAGNOSIS — R413 Other amnesia: Secondary | ICD-10-CM

## 2019-10-18 DIAGNOSIS — D692 Other nonthrombocytopenic purpura: Secondary | ICD-10-CM

## 2019-10-18 DIAGNOSIS — I2510 Atherosclerotic heart disease of native coronary artery without angina pectoris: Secondary | ICD-10-CM

## 2019-10-18 DIAGNOSIS — I480 Paroxysmal atrial fibrillation: Secondary | ICD-10-CM

## 2019-10-18 DIAGNOSIS — D6869 Other thrombophilia: Secondary | ICD-10-CM

## 2019-10-18 DIAGNOSIS — D75839 Thrombocytosis, unspecified: Secondary | ICD-10-CM

## 2019-10-18 DIAGNOSIS — K648 Other hemorrhoids: Secondary | ICD-10-CM

## 2019-10-18 MED ORDER — HYDROCORTISONE ACETATE 30 MG RE SUPP: 1.0000 | Freq: Two times a day (BID) | RECTAL | 0 refills | Status: DC | PRN

## 2019-10-18 NOTE — Patient Instructions (Addendum)
Will send to GI doctor for hemorrhoids Will send in cream to use Get a squatty potty Add chia seeds and flax seeds to things.   FIBER SUPPLEMENT  Benefiber or Citracel is good for constipation/diarrhea/irritable bowel syndrome, it helps with weight loss and can help lower your bad cholesterol. Please do 1 TBSP in the morning in water, coffee, or tea. It can take up to a month before you can see a difference with your bowel movements. It is cheapest from [employer], [employer], [employer].    Use a dropper or use a cap to put peroxide, olive oil,mineral oil or canola oil in the effected ear- 2-3 times a week. Let it soak for 20-30 min then you can take a shower or use a baby bulb with warm water to wash out the ear wax.  Can buy debrox wax removal kit over the counter.  Do not use Qtips   Can use the nebulizer before you exercise to see if you can do more   INFORMATION ABOUT YOUR XRAY  Can walk into [address] building for an Insurance account manager. They will have the order and take you back. You do not any paper work, I should get the result back today or tomorrow. This order is good for a year.  Can call [phone] to schedule an appointment if you wish.     If I told you I had a single pill that would help you with everything listed below and more, would you be interested? . decrease stress by improving anxiety and depression . help you achieve a healthy weight . give you more energy . make you more productive . help you focus . decrease your risk of dementia/heart attack/stroke/falls . improve your bone health  These are just some of the benefits that exercise brings to you.   IT IS WORTH carving out some time every day to fit in exercise. It will help in every aspect of your health. Even if you have injuries that prevent you from participating in a type of exercise you used to do; there is always something that you can do to keep exercise a part of your life. If improving your health is important,  make exercise your priority. It is worth the time! If you have questions about the type of exercise that is right for you, please talk with me about this!  EXERCISE IS MEDICINE!  Benefits of Exercise  Reduces breast cancer onset and recurrence by 50% Lowers risk of colon cancer by 66% Reduces the risk of Alzheimer's by almost 50% Reduces heart disease and high blood pressure by almost 50% Lowers risk of stroke by 33% Lowers risk of Type II Diabetes Mellitus by over 60% Treats depression as well as medication or cognitive behavioral therapy       Exercising to Stay Healthy  Exercising regularly is important. It has many health benefits, such as:  Improving your overall fitness, flexibility, and endurance.  Increasing your bone density.  Helping with weight control.  Decreasing your body fat.  Increasing your muscle strength.  Reducing stress and tension.  Improving your overall health.   In order to become healthy and stay healthy, it is recommended that you do moderate-intensity and vigorous-intensity exercise. You can tell that you are exercising at a moderate intensity if you have a higher heart rate and faster breathing, but you are still able to hold a conversation. You can tell that you are exercising at a vigorous intensity if you are breathing much  harder and faster and cannot hold a conversation while exercising. How often should I exercise? Choose an activity that you enjoy and set realistic goals. Your health care provider can help you to make an activity plan that works for you. Exercise regularly as directed by your health care provider. This may include:  Doing resistance training twice each week, such as: ? Push-ups. ? Sit-ups. ? Lifting weights. ? Using resistance bands.  Doing a given intensity of exercise for a given amount of time. Choose from these options: ? 150 minutes of moderate-intensity exercise every week. ? 75 minutes of vigorous-intensity  exercise every week. ? A mix of moderate-intensity and vigorous-intensity exercise every week.   Children, pregnant women, people who are out of shape, people who are overweight, and older adults may need to consult a health care provider for individual recommendations. If you have any sort of medical condition, be sure to consult your health care provider before starting a new exercise program. What are some exercise ideas? Some moderate-intensity exercise ideas include:  Walking at a rate of 1 mile in 15 minutes.  Biking.  Hiking.  Golfing.  Dancing.   Some vigorous-intensity exercise ideas include:  Walking at a rate of at least 4.5 miles per hour.  Jogging or running at a rate of 5 miles per hour.  Biking at a rate of at least 10 miles per hour.  Lap swimming.  Roller-skating or in-line skating.  Cross-country skiing.  Vigorous competitive sports, such as football, basketball, and soccer.  Jumping rope.  Aerobic dancing.   What are some everyday activities that can help me to get exercise?  [place] work, such as: ? Pushing a Conservation officer, nature. ? Raking and bagging leaves.  Washing and waxing your car.  Pushing a stroller.  Shoveling snow.  Gardening.  Washing windows or floors. How can I be more active in my day-to-day activities?  Use the stairs instead of the elevator.  Take a walk during your lunch break.  If you drive, park your car farther away from work or school.  If you take public transportation, get off one stop early and walk the rest of the way.  Make all of your phone calls while standing up and walking around.  Get up, stretch, and walk around every 30 minutes throughout the day. What guidelines should I follow while exercising?  Do not exercise so much that you hurt yourself, feel dizzy, or get very short of breath.  Consult your health care provider before starting a new exercise program.  Wear comfortable clothes and shoes with good  support.  Drink plenty of water while you exercise to prevent dehydration or heat stroke. Body water is lost during exercise and must be replaced.  Work out until you breathe faster and your heart beats faster. This information is not intended to replace advice given to you by your health care provider. Make sure you discuss any questions you have with your health care provider.

## 2019-10-19 ENCOUNTER — Encounter: Payer: Self-pay | Admitting: Gastroenterology

## 2019-10-19 LAB — PROTIME-INR
INR: 3.4 — ABNORMAL HIGH
Prothrombin Time: 34 s — ABNORMAL HIGH (ref 9.0–11.5)

## 2019-10-19 LAB — HEMOGLOBIN A1C
Hgb A1c MFr Bld: 5.7 % of total Hgb — ABNORMAL HIGH (ref <5.7)
Mean Plasma Glucose: 117 (calc)
eAG (mmol/L): 6.5 (calc)

## 2019-10-19 LAB — VITAMIN D 25 HYDROXY (VIT D DEFICIENCY, FRACTURES): Vit D, 25-Hydroxy: 76 ng/mL (ref 30–100)

## 2019-10-19 LAB — COMPLETE METABOLIC PANEL WITH GFR
AG Ratio: 2 (calc) (ref 1.0–2.5)
ALT: 20 U/L (ref 9–46)
AST: 24 U/L (ref 10–35)
Albumin: 4.5 g/dL (ref 3.6–5.1)
Alkaline phosphatase (APISO): 68 U/L (ref 35–144)
BUN: 23 mg/dL (ref 7–25)
CO2: 31 mmol/L (ref 20–32)
Calcium: 9.7 mg/dL (ref 8.6–10.3)
Chloride: 103 mmol/L (ref 98–110)
Creat: 1.09 mg/dL (ref 0.70–1.18)
GFR, Est African American: 74 mL/min/{1.73_m2} (ref >=60)
GFR, Est Non African American: 64 mL/min/{1.73_m2} (ref >=60)
Globulin: 2.2 g/dL (calc) (ref 1.9–3.7)
Glucose, Bld: 108 mg/dL — ABNORMAL HIGH (ref 65–99)
Potassium: 4.8 mmol/L (ref 3.5–5.3)
Sodium: 140 mmol/L (ref 135–146)
Total Bilirubin: 1 mg/dL (ref 0.2–1.2)
Total Protein: 6.7 g/dL (ref 6.1–8.1)

## 2019-10-19 LAB — MAGNESIUM: Magnesium: 1.6 mg/dL (ref 1.5–2.5)

## 2019-10-19 LAB — LIPID PANEL
Cholesterol: 140 mg/dL (ref <200)
HDL: 24 mg/dL — ABNORMAL LOW (ref >=40)
LDL Cholesterol (Calc): 92 mg/dL (calc)
Non-HDL Cholesterol (Calc): 116 mg/dL (calc) (ref <130)
Total CHOL/HDL Ratio: 5.8 (calc) — ABNORMAL HIGH (ref <5.0)
Triglycerides: 138 mg/dL (ref <150)

## 2019-10-19 LAB — CBC WITH DIFFERENTIAL/PLATELET
Absolute Monocytes: 466 cells/uL (ref 200–950)
Basophils Absolute: 137 cells/uL (ref 0–200)
Basophils Relative: 1 %
Eosinophils Absolute: 110 cells/uL (ref 15–500)
Eosinophils Relative: 0.8 %
HCT: 47.3 % (ref 38.5–50.0)
Hemoglobin: 15.5 g/dL (ref 13.2–17.1)
Lymphs Abs: 1644 cells/uL (ref 850–3900)
MCH: 32.1 pg (ref 27.0–33.0)
MCHC: 32.8 g/dL (ref 32.0–36.0)
MCV: 97.9 fL (ref 80.0–100.0)
MPV: 11.1 fL (ref 7.5–12.5)
Monocytes Relative: 3.4 %
Neutro Abs: 11344 cells/uL — ABNORMAL HIGH (ref 1500–7800)
Neutrophils Relative %: 82.8 %
Platelets: 341 10*3/uL (ref 140–400)
RBC: 4.83 10*6/uL (ref 4.20–5.80)
RDW: 15.3 % — ABNORMAL HIGH (ref 11.0–15.0)
Total Lymphocyte: 12 %
WBC: 13.7 10*3/uL — ABNORMAL HIGH (ref 3.8–10.8)

## 2019-10-19 LAB — TSH: TSH: 2.75 mIU/L (ref 0.40–4.50)

## 2019-10-20 ENCOUNTER — Other Ambulatory Visit: Payer: Self-pay | Admitting: Physician Assistant

## 2019-10-20 MED ORDER — TRIAMCINOLONE ACETONIDE 0.5 % EX CREA: 1.0000 "application " | TOPICAL_CREAM | Freq: Two times a day (BID) | CUTANEOUS | 2 refills | Status: DC

## 2019-10-20 MED ORDER — HYDROCORTISONE ACETATE 25 MG RE SUPP: 25.0000 mg | Freq: Two times a day (BID) | RECTAL | 0 refills | Status: DC | PRN

## 2019-10-25 DIAGNOSIS — Z7901 Long term (current) use of anticoagulants: Secondary | ICD-10-CM | POA: Diagnosis not present

## 2019-10-25 DIAGNOSIS — I480 Paroxysmal atrial fibrillation: Secondary | ICD-10-CM | POA: Diagnosis not present

## 2019-10-25 DIAGNOSIS — I255 Ischemic cardiomyopathy: Secondary | ICD-10-CM | POA: Diagnosis not present

## 2019-10-25 DIAGNOSIS — Z95810 Presence of automatic (implantable) cardiac defibrillator: Secondary | ICD-10-CM | POA: Diagnosis not present

## 2019-10-26 ENCOUNTER — Inpatient Hospital Stay (HOSPITAL_BASED_OUTPATIENT_CLINIC_OR_DEPARTMENT_OTHER): Payer: Medicare Other | Admitting: Hematology

## 2019-10-26 ENCOUNTER — Other Ambulatory Visit: Payer: Self-pay

## 2019-10-26 ENCOUNTER — Inpatient Hospital Stay: Payer: Medicare Other

## 2019-10-26 ENCOUNTER — Ambulatory Visit
Admission: RE | Admit: 2019-10-26 | Discharge: 2019-10-26 | Disposition: A | Discharge status: Home / Self Care | Payer: Medicare Other | Source: Ambulatory Visit | Attending: Physician Assistant | Admitting: Physician Assistant

## 2019-10-26 ENCOUNTER — Inpatient Hospital Stay: Payer: Medicare Other | Attending: Hematology

## 2019-10-26 ENCOUNTER — Other Ambulatory Visit: Payer: Self-pay | Admitting: *Deleted

## 2019-10-26 ENCOUNTER — Telehealth: Payer: Self-pay | Admitting: Hematology

## 2019-10-26 VITALS — BP 95/77 | HR 80 | Temp 97.3°F | Resp 18 | Ht 69.0 in | Wt 267.8 lb

## 2019-10-26 DIAGNOSIS — D72829 Elevated white blood cell count, unspecified: Secondary | ICD-10-CM | POA: Diagnosis not present

## 2019-10-26 DIAGNOSIS — D45 Polycythemia vera: Secondary | ICD-10-CM

## 2019-10-26 DIAGNOSIS — I2510 Atherosclerotic heart disease of native coronary artery without angina pectoris: Secondary | ICD-10-CM

## 2019-10-26 DIAGNOSIS — J439 Emphysema, unspecified: Secondary | ICD-10-CM

## 2019-10-26 DIAGNOSIS — Z1589 Genetic susceptibility to other disease: Secondary | ICD-10-CM

## 2019-10-26 DIAGNOSIS — I4891 Unspecified atrial fibrillation: Secondary | ICD-10-CM | POA: Diagnosis not present

## 2019-10-26 LAB — CBC WITH DIFFERENTIAL (CANCER CENTER ONLY)
Abs Immature Granulocytes: 0.15 10*3/uL — ABNORMAL HIGH (ref 0.00–0.07)
Basophils Absolute: 0.2 10*3/uL — ABNORMAL HIGH (ref 0.0–0.1)
Basophils Relative: 1 %
Eosinophils Absolute: 0.1 10*3/uL (ref 0.0–0.5)
Eosinophils Relative: 1 %
HCT: 50.4 % (ref 39.0–52.0)
Hemoglobin: 16 g/dL (ref 13.0–17.0)
Immature Granulocytes: 1 %
Lymphocytes Relative: 13 %
Lymphs Abs: 1.9 10*3/uL (ref 0.7–4.0)
MCH: 32.2 pg (ref 26.0–34.0)
MCHC: 31.7 g/dL (ref 30.0–36.0)
MCV: 101.4 fL — ABNORMAL HIGH (ref 80.0–100.0)
Monocytes Absolute: 0.7 10*3/uL (ref 0.1–1.0)
Monocytes Relative: 5 %
Neutro Abs: 12 10*3/uL — ABNORMAL HIGH (ref 1.7–7.7)
Neutrophils Relative %: 79 %
Platelet Count: 326 10*3/uL (ref 150–400)
RBC: 4.97 MIL/uL (ref 4.22–5.81)
RDW: 15.5 % (ref 11.5–15.5)
WBC Count: 15.1 10*3/uL — ABNORMAL HIGH (ref 4.0–10.5)
nRBC: 0 % (ref 0.0–0.2)

## 2019-10-26 LAB — CMP (CANCER CENTER ONLY)
ALT: 33 U/L (ref 0–44)
AST: 30 U/L (ref 15–41)
Albumin: 3.9 g/dL (ref 3.5–5.0)
Alkaline Phosphatase: 67 U/L (ref 38–126)
Anion gap: 13 (ref 5–15)
BUN: 24 mg/dL — ABNORMAL HIGH (ref 8–23)
CO2: 26 mmol/L (ref 22–32)
Calcium: 9.5 mg/dL (ref 8.9–10.3)
Chloride: 100 mmol/L (ref 98–111)
Creatinine: 1.15 mg/dL (ref 0.61–1.24)
GFR, Est AFR Am: >60 mL/min (ref >60)
GFR, Estimated: >60 mL/min (ref >60)
Glucose, Bld: 146 mg/dL — ABNORMAL HIGH (ref 70–99)
Potassium: 4.3 mmol/L (ref 3.5–5.1)
Sodium: 139 mmol/L (ref 135–145)
Total Bilirubin: 1.3 mg/dL — ABNORMAL HIGH (ref 0.3–1.2)
Total Protein: 6.7 g/dL (ref 6.5–8.1)

## 2019-10-26 NOTE — Progress Notes (Signed)
HEMATOLOGY/ONCOLOGY CLINIC NOTE  Date of Service: 10/26/2019  Patient Care Team: [name], MD as PCP - General (Internal Medicine) Georg Ruddle, Ashok Cordia, MD as Referring Physician (Cardiology) Shirlee More Ronalee Red, MD as Referring Physician (Cardiology) Brunetta Genera, MD as Consulting Physician (Hematology)  CHIEF COMPLAINTS/PURPOSE OF CONSULTATION:  F/u for polycythemia vera  HISTORY OF PRESENTING ILLNESS:   [name] [name] is a wonderful 79 y.o. male who has been referred to [place] by Dr. [name]  for evaluation and management of Thrombocytosis. He is accompanied today by his wife. The pt reports that he is doing well overall.   The pt reports that he has never had any blood clots.  He has atrial fibrillation and has taken Coumadin since 2015. He had heart surgery in 2015, and had his ICD placed in 2016. The pt also takes 81g aspirin daily.   The pt notes that he has not been aware of his high platelets previously, but only as of his most recent labs. He denies any recent major bleeds, surgeries, and other trauma. He denies having a splenectomy at any time as well.   The pt notes that his right kidney gets sore from time to time when he doesn't stay well hydrated. He also notes that when he urinates at these times, his urine is very dark. The pt adds that he had kidney stones one time. The pt is also taking Lasix and notes that he urinates frequently.   The pt adds that his gums have been very sore, even to touch from his tongue.  The pt notes that he does not want to use a CPAP after a bad experience with a previous sleep study that did diagnose him with sleep apnea. He notes that he falls asleep several times a day as well, falling asleep easily when he sits down, and denying falling asleep while driving. He also notes some increased difficulty remembering.   Most recent lab results (12/09/17) of CBC is as follows: all values are WNL except for WBC at 11.9k, RDW at  18.4, PLT at 649k, ANC at 9.7k.  On review of systems, pt reports good energy levels, falling asleep several times a day, staying active, and denies recent surgery, recent trauma, bleeding, unexpected weight loss, pain along the spine, abdominal pains, and any other symptoms.   On Family Hx the pt reports prostate cancer.   Interval History:  [name] [name] returns today for management and evaluation of his JAK2 positive MPN. The patient's last visit with [place] was on 07/26/2019. The pt reports that he is doing well overall.  The pt reports that he has been having some issues with his short-term memory. Pt has been experiencing discomfort when passing bowel movements due to internal hemorrhoids. He has spoken to his PCP about this and has been referred to a GI. Occasionally he has a cough with clear phlegm. Pt denies any issues with his daily Aspirin or Hydroxyurea.   Lab results today (10/26/19) of CBC w/diff and CMP is as follows: all values are WNL except for WBC at 15.1K, MCV at 101.4, Neutro Abs at 12.0K, Baso Abs at 0.2K, Abs Immature Granulocytes at 0.15K, Glucose at 146, BUN at 24, Total Bilirubin at 1.3.  On review of systems, pt reports hemorrhoids, productive cough, memory issues, leg swelling and denies fevers, chills, night sweats, diffculty swallowing, throat tightness and any other symptoms.   MEDICAL HISTORY:  Past Medical History:  Diagnosis Date  . A-fib ([place])  01/2014  . AICD (automatic cardioverter/defibrillator) present    [employer]  . Arthritis   . ASHD (arteriosclerotic heart disease) 2015   s/p CABG  . Cataract    s/p left  . COPD (chronic obstructive pulmonary disease) ([place])    by CXR, pt unsure of this  . GERD (gastroesophageal reflux disease)   . Gout 2014  . Hyperlipidemia   . Hypertension   . Hypothyroidism   . LBBB (left bundle branch block)   . Pre-diabetes   . Prediabetes 01/2014  . Presence of permanent cardiac pacemaker   . Sleep apnea    no  CPAP  . Thyroid disease    hypothyroid    SURGICAL HISTORY: Past Surgical History:  Procedure Laterality Date  . CARDIAC DEFIBRILLATOR PLACEMENT  07/2014  . CATARACT EXTRACTION W/ INTRAOCULAR LENS IMPLANT Left   . CORONARY ARTERY BYPASS GRAFT  11/2013  . lumb laminectomy  [phone]   x 3  . LUMBAR FUSION  2013  . NASAL SEPTOPLASTY W/ TURBINOPLASTY Bilateral 01/30/2016   Procedure: NASAL SEPTOPLASTY WITH BILATERAL TURBINATE REDUCTION;  Surgeon: Rozetta Nunnery, MD;  Location: [facility];  Service: ENT;  Laterality: Bilateral;  . UVULECTOMY N/A 01/30/2016   Procedure: UVULECTOMY;  Surgeon: Rozetta Nunnery, MD;  Location: [facility] OR;  Service: ENT;  Laterality: N/A;    SOCIAL HISTORY: Social History   Socioeconomic History  . Marital status: Married    Spouse name: [name]   . Number of children: Not on file  . Years of education: 68  . Highest education level: Not on file  Occupational History  . Occupation: Retired  Tobacco Use  . Smoking status: Former Smoker    Packs/day: 2.00    Years: 25.00    Pack years: 50.00    Types: Cigarettes    Quit date: 05/20/1983    Years since quitting: 36.4  . Smokeless tobacco: Never Used  Substance and Sexual Activity  . Alcohol use: Not Currently    Alcohol/week: 0.0 standard drinks  . Drug use: No  . Sexual activity: Not on file  Other Topics Concern  . Not on file  Social History Narrative   Lives with wife, [name]   Caffeine use: daily (Coffee)   Social Determinants of Health   Financial Resource Strain:   . Difficulty of Paying Living Expenses:   Food Insecurity:   . Worried About Charity fundraiser in the Last Year:   . Arboriculturist in the Last Year:   Transportation Needs:   . Film/video editor (Medical):   Marland Kitchen Lack of Transportation (Non-Medical):   Physical Activity:   . Days of Exercise per Week:   . Minutes of Exercise per Session:   Stress:   . Feeling of Stress :   Social Connections:   . Frequency of  Communication with Friends and Family:   . Frequency of Social Gatherings with Friends and Family:   . Attends Religious Services:   . Active Member of Clubs or Organizations:   . Attends Archivist Meetings:   Marland Kitchen Marital Status:   Intimate Partner Violence:   . Fear of Current or Ex-Partner:   . Emotionally Abused:   Marland Kitchen Physically Abused:   . Sexually Abused:     FAMILY HISTORY: Family History  Problem Relation Age of Onset  . Alzheimer's disease Mother   . Mental illness Mother   . Depression Mother   . Heart disease Father 67  had 5 MI's & died 57 yo  . Alcohol abuse Son   . Stroke Maternal Grandmother     ALLERGIES:  is allergic to other.  MEDICATIONS:  Current Outpatient Medications  Medication Sig Dispense Refill  . acetaminophen (TYLENOL) 500 MG tablet Take 500 mg by mouth every 6 (six) hours as needed for mild pain.     Marland Kitchen albuterol (PROVENTIL) (2.5 MG/3ML) 0.083% nebulizer solution USE 1 VIAL IN NEBULIZER EVERY 6 HOURS AS NEEDED FOR WHEEZING OR SHORTNESS OF BREATH 75 mL 0  . allopurinol (ZYLOPRIM) 300 MG tablet Take 1 tablet Daily to prevent Gout 90 tablet 3  . aspirin EC 81 MG tablet Take 81 mg by mouth daily.    Marland Kitchen atorvastatin (LIPITOR) 40 MG tablet Take 1 tablet Daily for Cholesterol 90 tablet 3  . Black Pepper-Turmeric (TURMERIC COMPLEX/BLACK PEPPER PO) Take 1,000 mg by mouth daily.     . Cholecalciferol (VITAMIN D PO) Take 10,000 Units by mouth daily.     . diphenhydrAMINE (BENADRYL) 25 MG tablet Take 25 mg by mouth at bedtime.    . diphenhydrAMINE (SOMINEX) 25 MG tablet Take by mouth.    . docusate sodium (COLACE) 100 MG capsule Take 100 mg by mouth 2 (two) times daily. 1 tablet in the morning and 1 tablet at bedtime    . doxazosin (CARDURA) 4 MG tablet TAKE 1 TABLET EVERY DAY 90 tablet 3  . finasteride (PROSCAR) 5 MG tablet Take 1 tablet Daily for Prostate 90 tablet 3  . furosemide (LASIX) 40 MG tablet Take 1 tablet (40 mg total) by mouth daily.  (Patient taking differently: Take 40 mg by mouth 2 (two) times daily. ) 90 tablet 3  . hydrocortisone (ANUSOL-HC) 25 MG suppository Place 1 suppository (25 mg total) rectally 2 (two) times daily as needed for hemorrhoids or anal itching. 12 suppository 0  . hydroxyurea (HYDREA) 500 MG capsule TAKE 2 CAPSULES ONE TIME DAILY WITH FOOD 180 capsule 2  . levothyroxine (SYNTHROID) 200 MCG tablet Take 1 tablet daily on an empty stomach with only water for 30 minutes & no Antacid meds, Calcium or Magnesium for 4 hours & avoid Biotin 90 tablet 3  . losartan (COZAAR) 100 MG tablet Take 100 mg by mouth daily.    . magnesium oxide (MAG-OX) 400 MG tablet Take by mouth.    . metoprolol (TOPROL-XL) 200 MG 24 hr tablet Take 200 mg by mouth daily.    . Omega-3 Fatty Acids (FISH OIL) 1000 MG CAPS Take by mouth.    Marland Kitchen omeprazole (PRILOSEC) 20 MG capsule TAKE 1 CAPSULE TWICE DAILY 180 capsule 3  . OVER THE COUNTER MEDICATION daily. Vitamin A to Z 50 plus    . OVER THE COUNTER MEDICATION DHA 500 mg capsule daily.    . polyethylene glycol (MIRALAX / GLYCOLAX) packet Take 17 g by mouth daily.    . prednisoLONE acetate (PRED FORTE) 1 % ophthalmic suspension     . spironolactone (ALDACTONE) 25 MG tablet Take 1 tablet Daily for Heart & BP 90 tablet 3  . triamcinolone cream (KENALOG) 0.5 % Apply 1 application topically 2 (two) times daily. 80 g 2  . warfarin (COUMADIN) 5 MG tablet Take 1 to 2 tablets Daily as Directed (Patient taking differently: 7.5 mg. Take 1 to 2 tablets Daily as Directed Pt stated he is taking 7.5 of coumadin 04/26/19) 145 tablet 3  . zinc gluconate 50 MG tablet Take 50 mg by mouth daily.  No current facility-administered medications for this visit.    REVIEW OF SYSTEMS:   A 10+ POINT REVIEW OF SYSTEMS WAS OBTAINED including neurology, dermatology, psychiatry, cardiac, respiratory, lymph, extremities, GI, GU, Musculoskeletal, constitutional, breasts, reproductive, HEENT.  All pertinent positives are  noted in the HPI.  All others are negative.   PHYSICAL EXAMINATION: . Vitals:   10/26/19 1030  BP: 95/77  Pulse: 80  Resp: 18  Temp: (!) 97.3 F (36.3 C)  SpO2: 97%   Filed Weights   10/26/19 1030  Weight: 267 lb 12.8 oz (121.5 kg)   .Body mass index is 39.55 kg/m.   GENERAL:alert, in no acute distress and comfortable SKIN: no acute rashes, no significant lesions EYES: conjunctiva are pink and non-injected, sclera anicteric OROPHARYNX: MMM, no exudates, no oropharyngeal erythema or ulceration NECK: supple, no JVD LYMPH:  no palpable lymphadenopathy in the cervical, axillary or inguinal regions LUNGS: clear to auscultation b/l with normal respiratory effort HEART: regular rate & rhythm ABDOMEN:  normoactive bowel sounds , non tender, not distended. No palpable hepatosplenomegaly.  Extremity: 1+ pedal edema b/l PSYCH: alert & oriented x 3 with fluent speech NEURO: no focal motor/sensory deficits  LABORATORY DATA:  I have reviewed the data as listed  . CBC Latest Ref Rng & Units 10/26/2019 10/18/2019 07/26/2019  WBC 4.0 - 10.5 K/uL 15.1(H) 13.7(H) 15.4(H)  Hemoglobin 13.0 - 17.0 g/dL 16.0 15.5 15.7  Hematocrit 39.0 - 52.0 % 50.4 47.3 50.7  Platelets 150 - 400 K/uL 326 341 405(H)    . CMP Latest Ref Rng & Units 10/26/2019 10/18/2019 07/26/2019  Glucose 70 - 99 mg/dL 146(H) 108(H) 135(H)  BUN 8 - 23 mg/dL 24(H) 23 17  Creatinine 0.61 - 1.24 mg/dL 1.15 1.09 1.12  Sodium 135 - 145 mmol/L 139 140 137  Potassium 3.5 - 5.1 mmol/L 4.3 4.8 4.4  Chloride 98 - 111 mmol/L 100 103 101  CO2 22 - 32 mmol/L 26 31 30   Calcium 8.9 - 10.3 mg/dL 9.5 9.7 9.4  Total Protein 6.5 - 8.1 g/dL 6.7 6.7 6.8  Total Bilirubin 0.3 - 1.2 mg/dL 1.3(H) 1.0 1.2  Alkaline Phos 38 - 126 U/L 67 - 73  AST 15 - 41 U/L 30 24 32  ALT 0 - 44 U/L 33 20 27   . Lab Results  Component Value Date   LDH 442 (H) 12/21/2018    02/09/18 JAK2 Mutation Study:    02/09/18 BCR ABL FISH:    RADIOGRAPHIC STUDIES: I  have personally reviewed the radiological images as listed and agreed with the findings in the report. No results found.  ASSESSMENT & PLAN:   79 y.o. male with  1. 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 Some associated leukocytosis. No history of splenectomy. No previous h/o VTE, but significant cardiac risk factors. 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   02/09/18 BCR ABL FISH revealed no evidence of BCR/ABL rearrangement   PLAN: -Discussed pt labwork today, 10/26/19; WBC are stable, PLT are nml, Hgb & HCT are okay, blood chemistries are steady -Goal for PLT between 200K and 400K - PLT currently at goal  -No indication for a therapeutic phlebotomy today. Will tentatively set up therapeutic phlebotomy in 3 months.  -The pt has no  prohibitive toxicities from continuing 1000 mg Hydroxyurea 7 days/week at this time.  -Continue daily baby Aspirin  -Will see back in 3 months with labs  2. . Patient Active Problem List   Diagnosis Date Noted  . Abnormal glucose 10/12/2019  . Memory changes 01/10/2019  . Polycythemia vera ([place]) 10/21/2018  . Thrombocytosis ([place]) 12/29/2017  . Senile purpura ([place]) 09/01/2017  . Emphysema of lung ([place]) 06/01/2017  . Tortuous aorta ([facility]) 06/01/2017  . ASHD/CABG x 3V (11/2013) 02/06/2015  . Acquired thrombophilia ([place]) 02/06/2015  . Cardiac pacemaker/AICD (01/2014) 02/06/2015  . Morbid obesity ([place]) 01/03/2015  . HTN (hypertension) 01/02/2015  . Hyperlipidemia, mixed 01/02/2015  . Vitamin D deficiency 01/02/2015  . GERD  01/02/2015  . BPH (benign prostatic hyperplasia) 01/02/2015  . Medication management 01/02/2015  . Atrial fibrillation ([place]) 01/02/2015  . Hypothyroidism  01/02/2015  . OSA and COPD overlap syndrome ([place]) 01/02/2015  -continue f/u with PCP to optimize atherosclerotic risk factors.   FOLLOW UP: RTC with Dr Irene Limbo with labs and therapeutic phlebotomy in 3 months    The total time spent in the appt was 20 minutes and more than 50% was on counseling and direct patient cares.  All of the patient's questions were answered with apparent satisfaction. The patient knows to call the clinic with any problems, questions or concerns.   Sullivan Lone MD [place] AAHIVMS [facility] Hematology/Oncology Physician [facility]  (Office):       [phone] (Work cell):  [phone] (Fax):           [phone]  10/26/2019 11:20 AM  I, Yevette Edwards, am acting as a scribe for Dr. Sullivan Lone.   .I have reviewed the above documentation for accuracy and completeness, and I agree with the above. Brunetta Genera MD

## 2019-10-26 NOTE — Telephone Encounter (Signed)
Scheduled appt per 6/9 los.  Printed calendar and avs.

## 2019-11-09 DIAGNOSIS — I350 Nonrheumatic aortic (valve) stenosis: Secondary | ICD-10-CM | POA: Diagnosis not present

## 2019-11-09 DIAGNOSIS — I255 Ischemic cardiomyopathy: Secondary | ICD-10-CM | POA: Diagnosis not present

## 2019-11-09 DIAGNOSIS — R0600 Dyspnea, unspecified: Secondary | ICD-10-CM | POA: Diagnosis not present

## 2019-11-09 DIAGNOSIS — Z95810 Presence of automatic (implantable) cardiac defibrillator: Secondary | ICD-10-CM | POA: Diagnosis not present

## 2019-11-10 ENCOUNTER — Encounter: Payer: Self-pay | Admitting: Gastroenterology

## 2019-11-10 ENCOUNTER — Ambulatory Visit (INDEPENDENT_AMBULATORY_CARE_PROVIDER_SITE_OTHER): Payer: Medicare Other | Admitting: Gastroenterology

## 2019-11-10 VITALS — BP 140/80 | HR 72 | Ht 67.25 in | Wt 266.1 lb

## 2019-11-10 DIAGNOSIS — K602 Anal fissure, unspecified: Secondary | ICD-10-CM

## 2019-11-10 DIAGNOSIS — I2510 Atherosclerotic heart disease of native coronary artery without angina pectoris: Secondary | ICD-10-CM | POA: Diagnosis not present

## 2019-11-10 DIAGNOSIS — K625 Hemorrhage of anus and rectum: Secondary | ICD-10-CM | POA: Diagnosis not present

## 2019-11-10 DIAGNOSIS — K5900 Constipation, unspecified: Secondary | ICD-10-CM | POA: Diagnosis not present

## 2019-11-10 DIAGNOSIS — K6289 Other specified diseases of anus and rectum: Secondary | ICD-10-CM | POA: Diagnosis not present

## 2019-11-10 HISTORY — DX: Anal fissure, unspecified: K60.2

## 2019-11-10 MED ORDER — AMBULATORY NON FORMULARY MEDICATION: 1 refills | Status: DC

## 2019-11-10 MED ORDER — AMBULATORY NON FORMULARY MEDICATION: 3 refills | Status: DC

## 2019-11-10 NOTE — Progress Notes (Signed)
11/10/2019 [name] [name] [id number] 1940/06/05   HISTORY OF PRESENT ILLNESS: This is a 79 year old male with multiple medical problems as listed below.  He is here today at the request of his PCP, Dr. Leslie Andrea, PA-C, for evaluation regarding rectal pain and bleeding.  He says that he has been experiencing the symptoms for a few months.  Has a lot of rectal pain when he has a larger, harder stool.  He says that if he eats a lot of salads and his stools seem to be softer then they do not cause him much discomfort.  Also describes small amounts of bright red blood as well.  He has been using steroid type creams that have been prescribed by his PCPs office.  He tells me that he does use MiraLAX daily.  Last colonoscopy was 2008 at which time he just had diverticulosis.  He then actually had a normal/negative Cologuard in June 2018.   Past Medical History:  Diagnosis Date  . A-fib ([place]) 01/2014  . AICD (automatic cardioverter/defibrillator) present    [employer]  . Arthritis   . ASHD (arteriosclerotic heart disease) 2015   s/p CABG  . Asthma   . CAD (coronary artery disease)   . Cataract    s/p left  . CHF (congestive heart failure) ([place])   . COPD (chronic obstructive pulmonary disease) ([place])    by CXR, pt unsure of this  . Diverticulosis   . GERD (gastroesophageal reflux disease)   . Gout 2014  . Hyperlipidemia   . Hypertension   . Hypothyroidism   . LBBB (left bundle branch block)   . Polycythemia   . Pre-diabetes   . Prediabetes 01/2014  . Presence of permanent cardiac pacemaker   . Sleep apnea    no CPAP  . Thyroid disease    hypothyroid   Past Surgical History:  Procedure Laterality Date  . CARDIAC DEFIBRILLATOR PLACEMENT  07/2014  . CATARACT EXTRACTION W/ INTRAOCULAR LENS IMPLANT Left   . CORONARY ARTERY BYPASS GRAFT  11/2013  . lumb laminectomy  [phone]   x 3  . LUMBAR FUSION  2013  . NASAL SEPTOPLASTY W/ TURBINOPLASTY Bilateral  01/30/2016   Procedure: NASAL SEPTOPLASTY WITH BILATERAL TURBINATE REDUCTION;  Surgeon: Rozetta Nunnery, MD;  Location: [facility];  Service: ENT;  Laterality: Bilateral;  . TONSILLECTOMY    . UVULECTOMY N/A 01/30/2016   Procedure: UVULECTOMY;  Surgeon: Rozetta Nunnery, MD;  Location: [facility];  Service: ENT;  Laterality: N/A;    reports that he quit smoking about 36 years ago. His smoking use included cigarettes. He has a 50.00 pack-year smoking history. He has never used smokeless tobacco. He reports previous alcohol use. He reports that he does not use drugs. family history includes Alcohol abuse in his son; Alzheimer's disease in his mother; Dementia in his maternal grandmother; Depression in his mother; Heart attack in his son; Heart disease (age of onset: 23) in his father; Hypertension in his father; Mental illness in his mother; Stroke in his maternal grandmother. Allergies  Allergen Reactions  . Other Swelling    SODIUM SENSITIVE      Outpatient Encounter Medications as of 11/10/2019  Medication Sig  . acetaminophen (TYLENOL) 500 MG tablet Take 500 mg by mouth as needed for mild pain.   Marland Kitchen allopurinol (ZYLOPRIM) 300 MG tablet Take 1 tablet Daily to prevent Gout  . aspirin EC 81 MG tablet Take 81 mg by mouth daily.  Marland Kitchen atorvastatin (  LIPITOR) 40 MG tablet Take 1 tablet Daily for Cholesterol  . Cholecalciferol (VITAMIN D PO) Take 10,000 Units by mouth daily.   . diphenhydrAMINE (BENADRYL) 25 MG tablet Take 25 mg by mouth at bedtime.  . Docosahexaenoic Acid (DHA PO) Take 500 mg by mouth daily. + EPA 250 mg  . docusate sodium (COLACE) 100 MG capsule Take 100 mg by mouth 2 (two) times daily. 1 tablet in the morning and 1 tablet at bedtime  . doxazosin (CARDURA) 4 MG tablet TAKE 1 TABLET EVERY DAY  . finasteride (PROSCAR) 5 MG tablet Take 1 tablet Daily for Prostate  . furosemide (LASIX) 40 MG tablet Take 1 tablet (40 mg total) by mouth daily. (Patient taking differently: Take 40 mg by  mouth 2 (two) times daily. )  . hydroxyurea (HYDREA) 500 MG capsule TAKE 2 CAPSULES ONE TIME DAILY WITH FOOD  . levothyroxine (SYNTHROID) 200 MCG tablet Take 1 tablet daily on an empty stomach with only water for 30 minutes & no Antacid meds, Calcium or Magnesium for 4 hours & avoid Biotin  . losartan (COZAAR) 100 MG tablet Take 100 mg by mouth daily.  . magnesium oxide (MAG-OX) 400 MG tablet Take by mouth.  . MELATONIN PO Take 1 tablet by mouth as needed.  . metoprolol (TOPROL-XL) 200 MG 24 hr tablet Take 200 mg by mouth daily.  . Multiple Vitamin (MULTIVITAMIN) tablet Take 1 tablet by mouth daily. A-Z  . omeprazole (PRILOSEC) 20 MG capsule TAKE 1 CAPSULE TWICE DAILY  . spironolactone (ALDACTONE) 25 MG tablet Take 1 tablet Daily for Heart & BP  . TURMERIC PO Take 1,000 mg by mouth daily.  Marland Kitchen warfarin (COUMADIN) 5 MG tablet Take 1 to 2 tablets Daily as Directed (Patient taking differently: 7.5 mg. Take 1 to 2 tablets Daily as Directed Pt stated he is taking 7.5 of coumadin 04/26/19)  . zinc gluconate 50 MG tablet Take 50 mg by mouth daily.  Marland Kitchen albuterol (PROVENTIL) (2.5 MG/3ML) 0.083% nebulizer solution USE 1 VIAL IN NEBULIZER EVERY 6 HOURS AS NEEDED FOR WHEEZING OR SHORTNESS OF BREATH (Patient not taking: Reported on 11/10/2019)  . Omega-3 Fatty Acids (FISH OIL) 1000 MG CAPS Take by mouth.  . [DISCONTINUED] Black Pepper-Turmeric (TURMERIC COMPLEX/BLACK PEPPER PO) Take 1,000 mg by mouth daily.   . [DISCONTINUED] diphenhydrAMINE (SOMINEX) 25 MG tablet Take by mouth.  . [DISCONTINUED] hydrocortisone (ANUSOL-HC) 25 MG suppository Place 1 suppository (25 mg total) rectally 2 (two) times daily as needed for hemorrhoids or anal itching.  . [DISCONTINUED] OVER THE COUNTER MEDICATION daily. Vitamin A to Z 50 plus  . [DISCONTINUED] OVER THE COUNTER MEDICATION DHA 500 mg capsule daily.  . [DISCONTINUED] polyethylene glycol (MIRALAX / GLYCOLAX) packet Take 17 g by mouth daily.  . [DISCONTINUED] prednisoLONE  acetate (PRED FORTE) 1 % ophthalmic suspension   . [DISCONTINUED] triamcinolone cream (KENALOG) 0.5 % Apply 1 application topically 2 (two) times daily.   No facility-administered encounter medications on file as of 11/10/2019.     REVIEW OF SYSTEMS  : All other systems reviewed and negative except where noted in the History of Present Illness.   PHYSICAL EXAM: BP 140/80 (BP Location: Left Arm, Patient Position: Sitting, Cuff Size: Normal)   Pulse 72   Ht 5' 7.25" (1.708 m) Comment: height measured without shoes  Wt 266 lb 2 oz (120.7 kg)   BMI 41.37 kg/m  General: Well developed white male in no acute distress Head: Normocephalic and atraumatic Eyes:  Sclerae anicteric, conjunctiva  pink. Ears: Normal auditory acuity Lungs: Clear throughout to auscultation; no increased WOB. Heart: Regular rate and rhythm; no M/R/G. Abdomen: Soft, non-distended.  BS present.  Non-tender. Rectal:  No external hemorrhoids noted.  Posterior anal fissure very visible and tender on exam.  DRE attempted but not completed due to pain. Musculoskeletal: Symmetrical with no gross deformities.  Skin: No lesions on visible extremities Extremities: No edema  Neurological: Alert oriented x 4, grossly non-focal Psychological:  Alert and cooperative. Normal mood and affect  ASSESSMENT AND PLAN: *Rectal pain and bleeding: Secondary to posterior anal fissure seen on today's exam.  He reports intermittent constipation depending on diet and definitely these larger, harder stools of the ones to cause him more pain.  I have asked him to discontinue topical steroids.  I am prescribing nitro gel 0.125% to be used 3 times daily for 6 to 8 weeks.  Prescription being sent to the pharmacy with refills.  Advised that he needs to keep stools on the softer side.  We discussed possibly increasing his MiraLAX to twice daily and/or increasing the amount of salads, fresh fruits and vegetables, etc. in his diet.   CC:  [name], MD

## 2019-11-10 NOTE — Patient Instructions (Addendum)
If you are age 79 or older, your body mass index should be between 23-30. Your Body mass index is 41.37 kg/m. If this is out of the aforementioned range listed, please consider follow up with your Primary Care Provider.  If you are age 43 or younger, your body mass index should be between 19-25. Your Body mass index is 41.37 kg/m. If this is out of the aformentioned range listed, please consider follow up with your Primary Care Provider.   We have sent a prescription for nitroglycerin 0.125% gel to [facility]. You should apply a pea size amount to your rectum three times daily to first knuckle x 6-8 weeks.  [facility] information is below: Address: [address] [place]  [phone]  *Please DO NOT go directly from our office to pick up this medication! Give the pharmacy 1 day to process the prescription as this is compounded and takes time to make.  Increase Miralax to twice daily; increase salads, vegetables and fruits into diet.

## 2019-11-10 NOTE — Progress Notes (Signed)
Reviewed and agree with management plan.  Elexis Pollak T. Lillyanne Bradburn, MD FACG [facility]  

## 2019-11-16 DIAGNOSIS — I255 Ischemic cardiomyopathy: Secondary | ICD-10-CM | POA: Diagnosis not present

## 2019-11-22 DIAGNOSIS — Z7901 Long term (current) use of anticoagulants: Secondary | ICD-10-CM | POA: Diagnosis not present

## 2019-11-22 DIAGNOSIS — I255 Ischemic cardiomyopathy: Secondary | ICD-10-CM | POA: Diagnosis not present

## 2019-11-22 DIAGNOSIS — I480 Paroxysmal atrial fibrillation: Secondary | ICD-10-CM | POA: Diagnosis not present

## 2019-11-22 DIAGNOSIS — Z95810 Presence of automatic (implantable) cardiac defibrillator: Secondary | ICD-10-CM | POA: Diagnosis not present

## 2019-12-14 ENCOUNTER — Encounter: Payer: Self-pay | Admitting: Internal Medicine

## 2019-12-16 DIAGNOSIS — I350 Nonrheumatic aortic (valve) stenosis: Secondary | ICD-10-CM | POA: Diagnosis not present

## 2019-12-21 ENCOUNTER — Encounter: Payer: Self-pay | Admitting: Internal Medicine

## 2019-12-21 ENCOUNTER — Other Ambulatory Visit: Payer: Self-pay

## 2019-12-21 ENCOUNTER — Ambulatory Visit (INDEPENDENT_AMBULATORY_CARE_PROVIDER_SITE_OTHER): Payer: Medicare Other | Admitting: Internal Medicine

## 2019-12-21 VITALS — BP 126/82 | HR 64 | Temp 97.5°F | Resp 18 | Ht 69.0 in | Wt 269.2 lb

## 2019-12-21 DIAGNOSIS — I2510 Atherosclerotic heart disease of native coronary artery without angina pectoris: Secondary | ICD-10-CM | POA: Diagnosis not present

## 2019-12-21 DIAGNOSIS — K602 Anal fissure, unspecified: Secondary | ICD-10-CM

## 2019-12-21 NOTE — Patient Instructions (Signed)
Anal Fissure, Adult    An anal fissure is a small tear or crack in the tissue of the anus. Bleeding from a fissure usually stops on its own within a few minutes. However, bleeding will often occur again with each bowel movement until the fissure heals. What are the causes? This condition is usually caused by passing a large or hard stool (feces). Other causes include:  Constipation.  Frequent diarrhea.  Inflammatory bowel disease (Crohn's disease or ulcerative colitis).  Childbirth.  Infections.  Anal sex. What are the signs or symptoms? Symptoms of this condition include:  Bleeding from the rectum.  Small amounts of blood seen on your stool, on the toilet paper, or in the toilet after a bowel movement. The blood coats the outside of the stool and is not mixed with the stool.  Painful bowel movements.  Itching or irritation around the anus. How is this diagnosed? A health care provider may diagnose this condition by closely examining the anal area. An anal fissure can usually be seen with careful inspection. In some cases, a rectal exam may be performed, or a short tube (anoscope) may be used to examine the anal canal. How is this treated? Initial treatment for this condition may include:  Taking steps to avoid constipation. This may include making changes to your diet, such as increasing your intake of fiber or fluid.  Taking fiber supplements. These supplements can soften your stool to help make bowel movements easier. Your health care provider may also prescribe a stool softener if your stool is hard.  Taking sitz baths. This may help to heal the tear.  Using medicated creams or ointments. These may be prescribed to lessen discomfort. Treatments that are sometimes used if initial treatments do not work well or if the condition is more severe may include:  Botulinum injection.  Surgery to repair the fissure. Follow these instructions at home: Eating and  drinking   Avoid foods that may cause constipation, such as bananas, milk, and other dairy products.  Eat all fruits, except bananas.  Drink enough fluid to keep your urine pale yellow.  Eat foods that are high in fiber, such as beans, whole grains, and fresh fruits and vegetables. General instructions   Take over-the-counter and prescription medicines only as told by your health care provider.  Use creams or ointments only as told by your health care provider.  Keep the anal area clean and dry.  Take sitz baths as told by your health care provider. Do not use soap in the sitz baths.  Keep all follow-up visits as told by your health care provider. This is important. Contact a health care provider if you have:  More bleeding.  A fever.  Diarrhea that is mixed with blood.  Pain that continues.  Ongoing problems that are getting worse rather than better. Summary  An anal fissure is a small tear or crack in the tissue of the anus. This condition is usually caused by passing a large or hard stool (feces). Other causes include constipation and frequent diarrhea.  Initial treatment for this condition may include taking steps to avoid constipation, such as increasing your intake of fiber or fluid.  Follow instructions for care as told by your health care provider.  Contact your health care provider if you have more bleeding or your problem is getting worse rather than better.  Keep all follow-up visits as told by your health care provider. This is important.

## 2019-12-21 NOTE — Progress Notes (Signed)
History of Present Illness:      This very nice 79 y.o. MWM  with HTN, ASHD /CABG /pAFib,HLD, Prediabetes, GERD, Gout  and Vitamin D Deficiency.  Patient is followed by Dr Irene Limbo and is on Hydrea for  thrombocythemia.      Patient was dx'd with an Anal Fissure on 6/24 at GI and Rx'd  Nitro gel 0.125% tid. Sx's persist and he presents for evaluation.  Medications  Current Outpatient Medications (Endocrine & Metabolic):  .  levothyroxine (SYNTHROID) 200 MCG tablet, Take 1 tablet daily on an empty stomach with only water for 30 minutes & no Antacid meds, Calcium or Magnesium for 4 hours & avoid Biotin  Current Outpatient Medications (Cardiovascular):  .  atorvastatin (LIPITOR) 40 MG tablet, Take 1 tablet Daily for Cholesterol .  doxazosin (CARDURA) 4 MG tablet, TAKE 1 TABLET EVERY DAY .  furosemide (LASIX) 40 MG tablet, Take 1 tablet (40 mg total) by mouth daily. (Patient taking differently: Take 40 mg by mouth 2 (two) times daily. ) .  losartan (COZAAR) 100 MG tablet, Take 100 mg by mouth daily. .  metoprolol (TOPROL-XL) 200 MG 24 hr tablet, Take 200 mg by mouth daily. Marland Kitchen  spironolactone (ALDACTONE) 25 MG tablet, Take 1 tablet Daily for Heart & BP  Current Outpatient Medications (Respiratory):  .  albuterol  2.5 MG/3ML neb soln,  EVERY 6 HOURS AS NEEDED  .  diphenhydrAMINE 25 MG tablet, Take 25 mg  at bedtime. Marland Kitchen  acetaminophen 500 MG tablet, Take 500 mg as needed   .  allopurinol 300 MG tablet, Take 1 tablet Daily  .  aspirin EC 81 MG tablet, Take  daily.  Marland Kitchen  warfarin (COUMADIN) 5 MG tablet, Take 1 to 2 tablets Daily as Directed (Patient taking differently: 7.5 mg. Take 1 to 2 tablets Daily as Directed Pt stated he is taking 7.5 of coumadin 04/26/19)   .   Nitro oint 0.125 % - Apply  three times daily. Marland Kitchen  VITAMIN D  Take 10,000 Units  daily.  . DHA, Take 500 mg  daily + EPA 250 mg .  COLACE 100 MG , Take 100 mg  2  times daily .  finasteride  5 MG, Take 1 tablet Daily .  HYDREA 500  MG cap TAKE 2 CAPSULES  DAILY  .  MAG-OX 400 MG tablet, Take daily .  MELATONIN , Take 1 tablet  as needed. .  Multiple Vitamin (, Take 1 tablet  daily. A-Z .  Omega-3 FISH OIL 1000 MG CAPS, Take daily. Marland Kitchen  omeprazole  20 MG caps, TAKE 1 CAPSULE TWICE DAILY .  MIRALAX 17 GM / SCOOP powder, Take 17 g  daily. .  TURMERIC , Take 1,000 mg  daily. Marland Kitchen  zinc gluconate 50 MG tablet, Take daily.  Problem list He has HTN (hypertension); Hyperlipidemia, mixed; Vitamin D deficiency; GERD ; BPH (benign prostatic hyperplasia); Medication management; Atrial fibrillation ([place]); Hypothyroidism; OSA and COPD overlap syndrome ([place]); Morbid obesity ([place]); ASHD/CABG x 3V (11/2013); Acquired thrombophilia ([place]); Cardiac pacemaker/AICD (01/2014); Emphysema of lung ([place]); Tortuous aorta ([place]); Senile purpura ([place]); Thrombocytosis ([place]); Polycythemia vera ([place]); Memory changes; Abnormal glucose; Anal fissure; Rectal pain; Constipation; and Rectal bleeding on their problem list.   Observations/Objective:   BP 126/82   Pulse 64   Temp (!) 97.5 F (36.4 C)   Resp 18   Ht 5\' 9"  (1.753 m)   Wt 269 lb 3.2 oz (122.1 kg)   BMI 39.75 kg/m  Exam focused reveals a fissure extending outward from the rectum.  Assessment and Plan:  1. Anal fissure  - Ambulatory referral to General Surgery       I discussed the assessment and treatment plan with the patient. The patient was provided an opportunity to ask questions and all were answered. The patient agreed with the plan and demonstrated an understanding of the instructions.    Kirtland Bouchard, MD

## 2019-12-22 ENCOUNTER — Ambulatory Visit: Payer: Medicare Other | Admitting: Gastroenterology

## 2019-12-27 DIAGNOSIS — Z95810 Presence of automatic (implantable) cardiac defibrillator: Secondary | ICD-10-CM | POA: Diagnosis not present

## 2019-12-27 DIAGNOSIS — I480 Paroxysmal atrial fibrillation: Secondary | ICD-10-CM | POA: Diagnosis not present

## 2019-12-27 DIAGNOSIS — Z7901 Long term (current) use of anticoagulants: Secondary | ICD-10-CM | POA: Diagnosis not present

## 2019-12-27 DIAGNOSIS — I255 Ischemic cardiomyopathy: Secondary | ICD-10-CM | POA: Diagnosis not present

## 2020-01-08 NOTE — Progress Notes (Signed)
FOLLOW UP  Assessment and Plan:   Essential hypertension At goal; continue medications Monitor blood pressure at home; call if consistently over 130/80 Continue DASH diet.   Reminder to go to the ER if any CP, SOB, nausea, dizziness, severe HA, changes vision/speech, left arm numbness and tingling and jaw pain.  Paroxysmal atrial fibrillation ([place]) Continue follow up with a fib clinic for coumadin management; rate controled today Discussed if patient falls to immediately contact office or go to ER. Discussed foods that can increase or decrease Coumadin levels. Patient understands to call the office before starting a new medication.  ASHD/CABG x 3V (11/2013) Control blood pressure, cholesterol (LDL goal <70), glucose, increase exercise.   Hypothyroidism, unspecified type continue medications the same reminded to take on an empty stomach 30-48mins before food.  -     TSH  Mixed hyperlipidemia Persistently above LDL goal of <70 on atorvastatin 40 mg daily; discussed and will switch to rosuvastatin 40 mg daily  Continue low cholesterol diet and exercise.  -     Lipid panel  Prediabetes Discussed disease and risks Discussed diet/exercise, weight management  A1C q46m; monitor weight and serum glucose  Medication management -     CBC with Differential/Platelet -     CMP/GFR  Morbid obesity ([place]) Long discussion about weight loss, diet, and exercise Recommended diet heavy in fruits and veggies and low in animal meats, cheeses, and dairy products, appropriate calorie intake Discussed appropriate weight for height  Follow up in 3 months  Vitamin D deficiency At goal; continue supplementation Defer vitamin D level  Memory changes Labs normal; stable sx; declined imaging or neuro referral  Discussed most common cause of memory decline is vascular etiology Discussed benefit with regular exercise; discussed goal of 150 min of mod intensity exercise, start with 5-15 min daily as  tolerated and increase gradually    Continue diet and meds as discussed. Further disposition pending results of labs. Discussed med's effects and SE's.   Over 30 minutes of exam, counseling, chart review, and critical decision making was performed.   Future Appointments  Date Time Provider [place]  01/26/2020 10:00 AM [facility] LAB [facility] None  01/26/2020 10:40 AM Brunetta Genera, MD [facility] None  01/26/2020 11:30 AM [facility] INFUSION [facility] None  05/09/2020  2:00 PM [name], MD [facility] None    ----------------------------------------------------------------------------------------------------------------------  HPI 79 y.o. male  presents for 3 month follow up on hypertension, cholesterol, prediabetes, obesity and vitamin D deficiency.   He has reported some short term memory changes since last year; difficulty with word recall; declined imaging, did have unremarkable labs including HIV, RPR, TSH, B12 in 12/2018. He is not highly concerned, hasn't progressed, continues to decline imaging or neuro referral, just wanted to mention as he does have reported family history of dementia in later life.   Notably does have OSA/COPD overlap, has CPAP but could not tolerate several masks. He has been sleeping in recliner and has noted improved sleep quality.   He is following with Dr. Irene Limbo for polycymethia vera and thrombocytosis, on hydroxyurea and undergoing therapeutic phlebotomies.  BMI is Body mass index is 39.13 kg/m., he has been working on diet, admits not exercising consistently, intermittently will walk on treadmill and lifting weights. Wt Readings from Last 3 Encounters:  01/11/20 265 lb (120.2 kg)  12/21/19 269 lb 3.2 oz (122.1 kg)  11/10/19 266 lb 2 oz (120.7 kg)   In 2015, he underwent a CABG x 3 V w/po Afib-  and failed 2 attempts at VCV. Then in 07/2014, he had a BiVent, AICD/Defib/PM planted.  He is followed at his Cardiologist's (Dr.  Shirlee More) coumadin clinic via [place].  He has tortuous aorta per CXR 05/2016 His blood pressure has been controlled at home, today their BP is BP: 104/66  He does workout. He denies chest pain, shortness of breath, dizziness.   He is on cholesterol medication (atorvastatin 40 mg daily) and denies myalgias. His cholesterol is at goal (cardiology has not recommended change). The cholesterol last visit was:   Lab Results  Component Value Date   CHOL 140 10/18/2019   HDL 24 (L) 10/18/2019   LDLCALC 92 10/18/2019   TRIG 138 10/18/2019   CHOLHDL 5.8 (H) 10/18/2019    He has been working on diet and exercise for prediabetes, and denies foot ulcerations, increased appetite, nausea, paresthesia of the feet, polydipsia, polyuria, visual disturbances and vomiting. Last A1C in the office was:  Lab Results  Component Value Date   HGBA1C 5.7 (H) 10/18/2019   He is on thyroid medication. His medication was not changed last visit.   Lab Results  Component Value Date   TSH 2.75 10/18/2019   [name]   Lab Results  Component Value Date   VD25OH 76 10/18/2019        Current Medications:  Current Outpatient Medications on File Prior to Visit  Medication Sig  . acetaminophen (TYLENOL) 500 MG tablet Take 500 mg by mouth as needed for mild pain.   Marland Kitchen albuterol (PROVENTIL) (2.5 MG/3ML) 0.083% nebulizer solution USE 1 VIAL IN NEBULIZER EVERY 6 HOURS AS NEEDED FOR WHEEZING OR SHORTNESS OF BREATH  . allopurinol (ZYLOPRIM) 300 MG tablet Take 1 tablet Daily to prevent Gout  . AMBULATORY NON FORMULARY MEDICATION Nitroglycerine ointment 0.125 % - Apply a pea sized amount to your rectum to the first knuckle three times daily.  Marland Kitchen aspirin EC 81 MG tablet Take 81 mg by mouth daily.  Marland Kitchen atorvastatin (LIPITOR) 40 MG tablet Take 1 tablet Daily for Cholesterol  . Cholecalciferol (VITAMIN D PO) Take 10,000 Units by mouth daily.   . diphenhydrAMINE (BENADRYL) 25 MG tablet Take 25 mg by  mouth at bedtime.  . Docosahexaenoic Acid (DHA PO) Take 500 mg by mouth daily. + EPA 250 mg  . docusate sodium (COLACE) 100 MG capsule Take 100 mg by mouth 2 (two) times daily. 1 tablet in the morning and 1 tablet at bedtime  . doxazosin (CARDURA) 4 MG tablet TAKE 1 TABLET EVERY DAY  . finasteride (PROSCAR) 5 MG tablet Take 1 tablet Daily for Prostate  . furosemide (LASIX) 40 MG tablet Take 1 tablet (40 mg total) by mouth daily. (Patient taking differently: Take 40 mg by mouth 2 (two) times daily. )  . hydroxyurea (HYDREA) 500 MG capsule TAKE 2 CAPSULES ONE TIME DAILY WITH FOOD  . levothyroxine (SYNTHROID) 200 MCG tablet Take 1 tablet daily on an empty stomach with only water for 30 minutes & no Antacid meds, Calcium or Magnesium for 4 hours & avoid Biotin  . losartan (COZAAR) 100 MG tablet Take 100 mg by mouth daily.  . magnesium oxide (MAG-OX) 400 MG tablet Take by mouth.  . MELATONIN PO Take 1 tablet by mouth as needed.  . metoprolol (TOPROL-XL) 200 MG 24 hr tablet Take 200 mg by mouth daily.  . Multiple Vitamin (MULTIVITAMIN) tablet Take 1 tablet by mouth daily. A-Z  . Omega-3 Fatty Acids (FISH OIL) 1000 MG  CAPS Take by mouth.  Marland Kitchen omeprazole (PRILOSEC) 20 MG capsule TAKE 1 CAPSULE TWICE DAILY  . polyethylene glycol powder (GLYCOLAX/MIRALAX) 17 GM/SCOOP powder Take 17 g by mouth daily.  Marland Kitchen spironolactone (ALDACTONE) 25 MG tablet Take 1 tablet Daily for Heart & BP  . TURMERIC PO Take 1,000 mg by mouth daily.  Marland Kitchen warfarin (COUMADIN) 5 MG tablet Take 1 to 2 tablets Daily as Directed (Patient taking differently: 5 mg. )  . zinc gluconate 50 MG tablet Take 50 mg by mouth daily.   No current facility-administered medications on file prior to visit.     Allergies:  Allergies  Allergen Reactions  . Other Swelling    SODIUM SENSITIVE     Medical History:  Past Medical History:  Diagnosis Date  . A-fib ([place]) 01/2014  . AICD (automatic cardioverter/defibrillator) present    [employer]   . Arthritis   . ASHD (arteriosclerotic heart disease) 2015   s/p CABG  . Asthma   . CAD (coronary artery disease)   . Cataract    s/p left  . CHF (congestive heart failure) ([place])   . COPD (chronic obstructive pulmonary disease) ([place])    by CXR, pt unsure of this  . Diverticulosis   . GERD (gastroesophageal reflux disease)   . Gout 2014  . Hyperlipidemia   . Hypertension   . Hypothyroidism   . LBBB (left bundle branch block)   . Polycythemia   . Pre-diabetes   . Prediabetes 01/2014  . Presence of permanent cardiac pacemaker   . Sleep apnea    no CPAP  . Thyroid disease    hypothyroid   Family history- Reviewed and unchanged Social history- Reviewed and unchanged   Review of Systems:  Review of Systems  Constitutional: Negative for malaise/fatigue and weight loss.  HENT: Negative for hearing loss and tinnitus.   Eyes: Negative for blurred vision and double vision.  Respiratory: Negative for cough, shortness of breath and wheezing.   Cardiovascular: Negative for chest pain, palpitations, orthopnea, claudication and leg swelling.  Gastrointestinal: Negative for abdominal pain, blood in stool, constipation, diarrhea, heartburn, melena, nausea and vomiting.  Genitourinary: Negative.   Musculoskeletal: Negative for falls, joint pain and myalgias.  Skin: Negative for rash.  Neurological: Negative for dizziness, tingling, sensory change, weakness and headaches.  Endo/Heme/Allergies: Negative for polydipsia.  Psychiatric/Behavioral: Positive for memory loss (stable; some short term recall, word finding ). Negative for depression, hallucinations and substance abuse. The patient is not nervous/anxious and does not have insomnia.   All other systems reviewed and are negative.     Physical Exam: BP 104/66   Pulse 63   Temp (!) 96.3 F (35.7 C)   Wt 265 lb (120.2 kg)   SpO2 93%   BMI 39.13 kg/m  Wt Readings from Last 3 Encounters:  01/11/20 265 lb (120.2 kg)  12/21/19  269 lb 3.2 oz (122.1 kg)  11/10/19 266 lb 2 oz (120.7 kg)   General Appearance: Well nourished, in no apparent distress. Eyes: PERRLA, EOMs, conjunctiva no swelling or erythema Sinuses: No Frontal/maxillary tenderness ENT/Mouth: Ext aud canals clear, TMs without erythema, bulging. No erythema, swelling, or exudate on post pharynx.  Tonsils not swollen or erythematous. Hearing normal.  Neck: Supple, thyroid normal.  Respiratory: Respiratory effort normal, BS equal bilaterally without rales, rhonchi, wheezing or stridor.  Cardio: RRR with no MRGs. Brisk peripheral pulses without edema.  Abdomen: Soft, obese, + BS.  Non tender, no guarding, rebound, hernias, masses. Lymphatics: Non  tender without lymphadenopathy.  Musculoskeletal: Full ROM, 5/5 strength, Normal gait Skin: Warm, dry without rashes, lesions, ecchymosis.  Neuro: Cranial nerves intact. No cerebellar symptoms. 2/3 word recall, some cognitive slowing, mild word recall difficulty  Psych: Awake and oriented X 3, normal affect, Insight and Judgment appropriate.    Izora Ribas, NP 11:41 AM [facility]

## 2020-01-09 DIAGNOSIS — Z95810 Presence of automatic (implantable) cardiac defibrillator: Secondary | ICD-10-CM | POA: Diagnosis not present

## 2020-01-09 DIAGNOSIS — I480 Paroxysmal atrial fibrillation: Secondary | ICD-10-CM | POA: Diagnosis not present

## 2020-01-09 DIAGNOSIS — I255 Ischemic cardiomyopathy: Secondary | ICD-10-CM | POA: Diagnosis not present

## 2020-01-09 DIAGNOSIS — Z7901 Long term (current) use of anticoagulants: Secondary | ICD-10-CM | POA: Diagnosis not present

## 2020-01-11 ENCOUNTER — Ambulatory Visit (INDEPENDENT_AMBULATORY_CARE_PROVIDER_SITE_OTHER): Payer: Medicare Other | Admitting: Adult Health

## 2020-01-11 ENCOUNTER — Encounter: Payer: Self-pay | Admitting: Adult Health

## 2020-01-11 ENCOUNTER — Other Ambulatory Visit: Payer: Self-pay

## 2020-01-11 VITALS — BP 104/66 | HR 63 | Temp 96.3°F | Wt 265.0 lb

## 2020-01-11 DIAGNOSIS — I10 Essential (primary) hypertension: Secondary | ICD-10-CM | POA: Diagnosis not present

## 2020-01-11 DIAGNOSIS — I480 Paroxysmal atrial fibrillation: Secondary | ICD-10-CM | POA: Diagnosis not present

## 2020-01-11 DIAGNOSIS — D6869 Other thrombophilia: Secondary | ICD-10-CM | POA: Diagnosis not present

## 2020-01-11 DIAGNOSIS — G4733 Obstructive sleep apnea (adult) (pediatric): Secondary | ICD-10-CM | POA: Diagnosis not present

## 2020-01-11 DIAGNOSIS — E039 Hypothyroidism, unspecified: Secondary | ICD-10-CM | POA: Diagnosis not present

## 2020-01-11 DIAGNOSIS — J449 Chronic obstructive pulmonary disease, unspecified: Secondary | ICD-10-CM

## 2020-01-11 DIAGNOSIS — E559 Vitamin D deficiency, unspecified: Secondary | ICD-10-CM

## 2020-01-11 DIAGNOSIS — R7309 Other abnormal glucose: Secondary | ICD-10-CM

## 2020-01-11 DIAGNOSIS — D45 Polycythemia vera: Secondary | ICD-10-CM

## 2020-01-11 DIAGNOSIS — D692 Other nonthrombocytopenic purpura: Secondary | ICD-10-CM | POA: Diagnosis not present

## 2020-01-11 DIAGNOSIS — I771 Stricture of artery: Secondary | ICD-10-CM | POA: Diagnosis not present

## 2020-01-11 DIAGNOSIS — E782 Mixed hyperlipidemia: Secondary | ICD-10-CM

## 2020-01-11 DIAGNOSIS — D473 Essential (hemorrhagic) thrombocythemia: Secondary | ICD-10-CM

## 2020-01-11 DIAGNOSIS — I2510 Atherosclerotic heart disease of native coronary artery without angina pectoris: Secondary | ICD-10-CM | POA: Diagnosis not present

## 2020-01-11 DIAGNOSIS — Z79899 Other long term (current) drug therapy: Secondary | ICD-10-CM | POA: Diagnosis not present

## 2020-01-11 DIAGNOSIS — D75839 Thrombocytosis, unspecified: Secondary | ICD-10-CM

## 2020-01-11 MED ORDER — ROSUVASTATIN CALCIUM 40 MG PO TABS: 40.0000 mg | ORAL_TABLET | Freq: Every day | ORAL | 1 refills | Status: DC

## 2020-01-11 NOTE — Patient Instructions (Addendum)
Goals    . Exercise 150 min/wk Moderate Activity    . LDL CALC < 70    . Weight (lb) < 250 lb (113.4 kg)       Try exercising on treadmill without albuterol nebulizer treatment  Then the next day do a nebulizer treatment first and get back on treadmill  Compare the two days - if note significant improvement with the albuterol treatment, do this as needed prior to exercise or exertional activities  If no perceived improvement with albuterol - most likely decompensation Need to start slow with exercise and work on consistency   Start slow - 5-15 min or as much as you can tolerate, do at least every other day, aim to work up to 150 min per week (20-30+ min daily)  Regular exercise is the MOST IMPORTANT thing you can do to help your memory at this point     When ROSUVASTATIN arrives, REPLACE ATORVASTATIN  This is to help your LDL "bad" cholesterol get to goal - <70      If having lots of bloating, gassiness, bowel movements are regular, may try cutting back on miralax to from twice daily to once daily, or alternate between doing once and twice daily  Goal is to keep stools soft and regular to keep anal fissure from flaring, but not causing excessive gassiness or other side effect    Rosuvastatin Tablets What is this medicine? ROSUVASTATIN (roe SOO va sta tin) is known as a HMG-CoA reductase inhibitor or 'statin'. It lowers cholesterol and triglycerides in the blood. This drug may also reduce the risk of heart attack, stroke, or other health problems in patients with risk factors for heart disease. Diet and lifestyle changes are often used with this drug. This medicine may be used for other purposes; ask your health care provider or pharmacist if you have questions. COMMON BRAND NAME(S): Crestor What should I tell my health care provider before I take this medicine? They need to know if you have any of these conditions:  diabetes  if you often drink alcohol  history of  stroke  kidney disease  liver disease  muscle aches or weakness  thyroid disease  an unusual or allergic reaction to rosuvastatin, other medicines, foods, dyes, or preservatives  pregnant or trying to get pregnant  breast-feeding How should I use this medicine? Take this medicine by mouth with a glass of water. Follow the directions on the prescription label. Do not cut, crush or chew this medicine. You can take this medicine with or without food. Take your doses at regular intervals. Do not take your medicine more often than directed. Talk to your pediatrician regarding the use of this medicine in children. While this drug may be prescribed for children as young as 32 years old for selected conditions, precautions do apply. Overdosage: If you think you have taken too much of this medicine contact a poison control center or emergency room at once. NOTE: This medicine is only for you. Do not share this medicine with others. What if I miss a dose? If you miss a dose, take it as soon as you can. If your next dose is to be taken in less than 12 hours, then do not take the missed dose. Take the next dose at your regular time. Do not take double or extra doses. What may interact with this medicine? Do not take this medicine with any of the following medications:  herbal medicines like red yeast rice This medicine  may also interact with the following medications:  alcohol  antacids containing aluminum hydroxide or magnesium hydroxide  cyclosporine  other medicines for high cholesterol  some medicines for HIV infection  warfarin This list may not describe all possible interactions. Give your health care provider a list of all the medicines, herbs, non-prescription drugs, or dietary supplements you use. Also tell them if you smoke, drink alcohol, or use illegal drugs. Some items may interact with your medicine. What should I watch for while using this medicine? Visit your doctor or  health care professional for regular check-ups. You may need regular tests to make sure your liver is working properly. Your health care professional may tell you to stop taking this medicine if you develop muscle problems. If your muscle problems do not go away after stopping this medicine, contact your health care professional. Do not become pregnant while taking this medicine. Women should inform their health care professional if they wish to become pregnant or think they might be pregnant. There is a potential for serious side effects to an unborn child. Talk to your health care professional or pharmacist for more information. Do not breast-feed an infant while taking this medicine. This medicine may increase blood sugar. Ask your healthcare provider if changes in diet or medicines are needed if you have diabetes. If you are going to need surgery or other procedure, tell your doctor that you are using this medicine. This drug is only part of a total heart-health program. Your doctor or a dietician can suggest a low-cholesterol and low-fat diet to help. Avoid alcohol and smoking, and keep a proper exercise schedule. This medicine may cause a decrease in Co-Enzyme Q-10. You should make sure that you get enough Co-Enzyme Q-10 while you are taking this medicine. Discuss the foods you eat and the vitamins you take with your health care professional. What side effects may I notice from receiving this medicine? Side effects that you should report to your doctor or health care professional as soon as possible:  allergic reactions like skin rash, itching or hives, swelling of the face, lips, or tongue  confusion  joint pain  loss of memory  redness, blistering, peeling or loosening of the skin, including inside the mouth  signs and symptoms of high blood sugar such as being more thirsty or hungry or having to urinate more than normal. You may also feel very tired or have blurry vision.  signs and  symptoms of muscle injury like dark urine; trouble passing urine or change in the amount of urine; unusually weak or tired; muscle pain or side or back pain  yellowing of the eyes or skin Side effects that usually do not require medical attention (report to your doctor or health care professional if they continue or are bothersome):  constipation  diarrhea  dizziness  gas  headache  nausea  stomach pain  trouble sleeping  upset stomach This list may not describe all possible side effects. Call your doctor for medical advice about side effects. You may report side effects to FDA at 1-800-FDA-1088. Where should I keep my medicine? Keep out of the reach of children. Store at room temperature between 20 and 25 degrees C (68 and 77 degrees F). Keep container tightly closed (protect from moisture). Throw away any unused medicine after the expiration date. NOTE: This sheet is a summary. It may not cover all possible information. If you have questions about this medicine, talk to your doctor, pharmacist, or health care provider.  2020 Elsevier/Gold Standard (2018-02-25 08:25:08)

## 2020-01-12 LAB — CBC WITH DIFFERENTIAL/PLATELET
Absolute Monocytes: 507 cells/uL (ref 200–950)
Basophils Absolute: 137 cells/uL (ref 0–200)
Basophils Relative: 1 %
Eosinophils Absolute: 123 cells/uL (ref 15–500)
Eosinophils Relative: 0.9 %
HCT: 48.7 % (ref 38.5–50.0)
Hemoglobin: 16.3 g/dL (ref 13.2–17.1)
Lymphs Abs: 1466 cells/uL (ref 850–3900)
MCH: 31.7 pg (ref 27.0–33.0)
MCHC: 33.5 g/dL (ref 32.0–36.0)
MCV: 94.7 fL (ref 80.0–100.0)
MPV: 10.6 fL (ref 7.5–12.5)
Monocytes Relative: 3.7 %
Neutro Abs: 11467 cells/uL — ABNORMAL HIGH (ref 1500–7800)
Neutrophils Relative %: 83.7 %
Platelets: 384 10*3/uL (ref 140–400)
RBC: 5.14 10*6/uL (ref 4.20–5.80)
RDW: 14.5 % (ref 11.0–15.0)
Total Lymphocyte: 10.7 %
WBC: 13.7 10*3/uL — ABNORMAL HIGH (ref 3.8–10.8)

## 2020-01-12 LAB — COMPLETE METABOLIC PANEL WITH GFR
AG Ratio: 2 (calc) (ref 1.0–2.5)
ALT: 30 U/L (ref 9–46)
AST: 30 U/L (ref 10–35)
Albumin: 4.5 g/dL (ref 3.6–5.1)
Alkaline phosphatase (APISO): 68 U/L (ref 35–144)
BUN/Creatinine Ratio: 18 (calc) (ref 6–22)
BUN: 22 mg/dL (ref 7–25)
CO2: 32 mmol/L (ref 20–32)
Calcium: 9.8 mg/dL (ref 8.6–10.3)
Chloride: 96 mmol/L — ABNORMAL LOW (ref 98–110)
Creat: 1.19 mg/dL — ABNORMAL HIGH (ref 0.70–1.18)
GFR, Est African American: 67 mL/min/{1.73_m2} (ref >=60)
GFR, Est Non African American: 58 mL/min/{1.73_m2} — ABNORMAL LOW (ref >=60)
Globulin: 2.2 g/dL (calc) (ref 1.9–3.7)
Glucose, Bld: 117 mg/dL — ABNORMAL HIGH (ref 65–99)
Potassium: 4.9 mmol/L (ref 3.5–5.3)
Sodium: 135 mmol/L (ref 135–146)
Total Bilirubin: 1.5 mg/dL — ABNORMAL HIGH (ref 0.2–1.2)
Total Protein: 6.7 g/dL (ref 6.1–8.1)

## 2020-01-12 LAB — LIPID PANEL
Cholesterol: 131 mg/dL (ref <200)
HDL: 22 mg/dL — ABNORMAL LOW (ref >=40)
LDL Cholesterol (Calc): 84 mg/dL (calc)
Non-HDL Cholesterol (Calc): 109 mg/dL (calc) (ref <130)
Total CHOL/HDL Ratio: 6 (calc) — ABNORMAL HIGH (ref <5.0)
Triglycerides: 151 mg/dL — ABNORMAL HIGH (ref <150)

## 2020-01-12 LAB — TSH: TSH: 2.16 mIU/L (ref 0.40–4.50)

## 2020-01-12 LAB — MAGNESIUM: Magnesium: 1.8 mg/dL (ref 1.5–2.5)

## 2020-01-17 ENCOUNTER — Other Ambulatory Visit: Payer: Self-pay | Admitting: Internal Medicine

## 2020-01-25 NOTE — Progress Notes (Signed)
HEMATOLOGY/ONCOLOGY CLINIC NOTE  Date of Service: 01/26/2020  Patient Care Team: [name], MD as PCP - General (Internal Medicine) Georg Ruddle, Ashok Cordia, MD as Referring Physician (Cardiology) Shirlee More Ronalee Red, MD as Referring Physician (Cardiology) Brunetta Genera, MD as Consulting Physician (Hematology)  CHIEF COMPLAINTS/PURPOSE OF CONSULTATION:  F/u for polycythemia vera  HISTORY OF PRESENTING ILLNESS:   [name] [name] is a wonderful 79 y.o. male who has been referred to [place] by Dr. [name]  for evaluation and management of Thrombocytosis. He is accompanied today by his wife. The pt reports that he is doing well overall.   The pt reports that he has never had any blood clots.  He has atrial fibrillation and has taken Coumadin since 2015. He had heart surgery in 2015, and had his ICD placed in 2016. The pt also takes 81g aspirin daily.   The pt notes that he has not been aware of his high platelets previously, but only as of his most recent labs. He denies any recent major bleeds, surgeries, and other trauma. He denies having a splenectomy at any time as well.   The pt notes that his right kidney gets sore from time to time when he doesn't stay well hydrated. He also notes that when he urinates at these times, his urine is very dark. The pt adds that he had kidney stones one time. The pt is also taking Lasix and notes that he urinates frequently.   The pt adds that his gums have been very sore, even to touch from his tongue.  The pt notes that he does not want to use a CPAP after a bad experience with a previous sleep study that did diagnose him with sleep apnea. He notes that he falls asleep several times a day as well, falling asleep easily when he sits down, and denying falling asleep while driving. He also notes some increased difficulty remembering.   Most recent lab results (12/09/17) of CBC is as follows: all values are WNL except for WBC at 11.9k, RDW at  18.4, PLT at 649k, ANC at 9.7k.  On review of systems, pt reports good energy levels, falling asleep several times a day, staying active, and denies recent surgery, recent trauma, bleeding, unexpected weight loss, pain along the spine, abdominal pains, and any other symptoms.   On Family Hx the pt reports prostate cancer.   Interval History:   [name] [name] returns today for management and evaluation of his JAK2 positive MPN. The patient's last visit with [place] was on 10/26/2019. The pt reports that he is doing well overall.  The pt reports that he has felt well and had no new concerns in the interim. Pt has continued taking 1000 mg Hydroxyurea daily and denies any issues. Pt received the Moderna COVID19 vaccine and is interested in the booster. His leg swelling and SOB has been stable. Pt is considering using a nebulizer to help with his breathing.   Lab results today (01/26/20) of CBC w/diff and CMP is as follows: all values are WNL except for WBC at 13.1K, MCV at 102.5, Neutro Abs at 10.9K, Abs Immature Granulocytes at 0.14K, Glucose at 143, Creatinine at 1.25, Total Bilirubin at 1.5, GFR Est Non Af Am at 54.  On review of systems, pt denies nausea, vomiting, diarrhea, chest pain, new SOB, vision changes, worsening leg swelling and any other symptoms.   MEDICAL HISTORY:  Past Medical History:  Diagnosis Date  . A-fib ([place]) 01/2014  .  AICD (automatic cardioverter/defibrillator) present    [employer]  . Arthritis   . ASHD (arteriosclerotic heart disease) 2015   s/p CABG  . Asthma   . CAD (coronary artery disease)   . Cataract    s/p left  . CHF (congestive heart failure) ([place])   . COPD (chronic obstructive pulmonary disease) ([place])    by CXR, pt unsure of this  . Diverticulosis   . GERD (gastroesophageal reflux disease)   . Gout 2014  . Hyperlipidemia   . Hypertension   . Hypothyroidism   . LBBB (left bundle branch block)   . Polycythemia   . Pre-diabetes   .  Prediabetes 01/2014  . Presence of permanent cardiac pacemaker   . Sleep apnea    no CPAP  . Thyroid disease    hypothyroid    SURGICAL HISTORY: Past Surgical History:  Procedure Laterality Date  . CARDIAC DEFIBRILLATOR PLACEMENT  07/2014  . CATARACT EXTRACTION W/ INTRAOCULAR LENS IMPLANT Left   . CORONARY ARTERY BYPASS GRAFT  11/2013  . lumb laminectomy  [phone]   x 3  . LUMBAR FUSION  2013  . NASAL SEPTOPLASTY W/ TURBINOPLASTY Bilateral 01/30/2016   Procedure: NASAL SEPTOPLASTY WITH BILATERAL TURBINATE REDUCTION;  Surgeon: Rozetta Nunnery, MD;  Location: [facility];  Service: ENT;  Laterality: Bilateral;  . TONSILLECTOMY    . UVULECTOMY N/A 01/30/2016   Procedure: UVULECTOMY;  Surgeon: Rozetta Nunnery, MD;  Location: [facility] OR;  Service: ENT;  Laterality: N/A;    SOCIAL HISTORY: Social History   Socioeconomic History  . Marital status: Married    Spouse name: [name]   . Number of children: 2  . Years of education: 34  . Highest education level: Not on file  Occupational History  . Occupation: Retired  Tobacco Use  . Smoking status: Former Smoker    Packs/day: 2.00    Years: 25.00    Pack years: 50.00    Types: Cigarettes    Quit date: 05/20/1983    Years since quitting: 36.7  . Smokeless tobacco: Never Used  Vaping Use  . Vaping Use: Never used  Substance and Sexual Activity  . Alcohol use: Not Currently    Alcohol/week: 0.0 standard drinks  . Drug use: No  . Sexual activity: Not on file  Other Topics Concern  . Not on file  Social History Narrative   Lives with wife, [name]   Caffeine use: daily (Coffee)   Social Determinants of Health   Financial Resource Strain:   . Difficulty of Paying Living Expenses: Not on file  Food Insecurity:   . Worried About Charity fundraiser in the Last Year: Not on file  . Ran Out of Food in the Last Year: Not on file  Transportation Needs:   . Lack of Transportation (Medical): Not on file  . Lack of Transportation  (Non-Medical): Not on file  Physical Activity:   . Days of Exercise per Week: Not on file  . Minutes of Exercise per Session: Not on file  Stress:   . Feeling of Stress : Not on file  Social Connections:   . Frequency of Communication with Friends and Family: Not on file  . Frequency of Social Gatherings with Friends and Family: Not on file  . Attends Religious Services: Not on file  . Active Member of Clubs or Organizations: Not on file  . Attends Archivist Meetings: Not on file  . Marital Status: Not on file  Intimate  Partner Violence:   . Fear of Current or Ex-Partner: Not on file  . Emotionally Abused: Not on file  . Physically Abused: Not on file  . Sexually Abused: Not on file    FAMILY HISTORY: Family History  Problem Relation Age of Onset  . Alzheimer's disease Mother   . Mental illness Mother   . Depression Mother   . Heart disease Father 49       had 5 MI's & died 33 yo  . Hypertension Father   . Alcohol abuse Son   . Stroke Maternal Grandmother   . Dementia Maternal Grandmother   . Heart attack Son     ALLERGIES:  is allergic to other.  MEDICATIONS:  Current Outpatient Medications  Medication Sig Dispense Refill  . acetaminophen (TYLENOL) 500 MG tablet Take 500 mg by mouth as needed for mild pain.     Marland Kitchen albuterol (PROVENTIL) (2.5 MG/3ML) 0.083% nebulizer solution USE 1 VIAL IN NEBULIZER EVERY 6 HOURS AS NEEDED FOR WHEEZING OR SHORTNESS OF BREATH 75 mL 0  . allopurinol (ZYLOPRIM) 300 MG tablet Take 1 tablet Daily to prevent Gout 90 tablet 3  . AMBULATORY NON FORMULARY MEDICATION Nitroglycerine ointment 0.125 % - Apply a pea sized amount to your rectum to the first knuckle three times daily. 30 g 3  . aspirin EC 81 MG tablet Take 81 mg by mouth daily.    . Cholecalciferol (VITAMIN D PO) Take 10,000 Units by mouth daily.     . diphenhydrAMINE (BENADRYL) 25 MG tablet Take 25 mg by mouth at bedtime.    . Docosahexaenoic Acid (DHA PO) Take 500 mg by  mouth daily. + EPA 250 mg    . docusate sodium (COLACE) 100 MG capsule Take 100 mg by mouth 2 (two) times daily. 1 tablet in the morning and 1 tablet at bedtime    . doxazosin (CARDURA) 4 MG tablet TAKE 1 TABLET EVERY DAY 90 tablet 3  . finasteride (PROSCAR) 5 MG tablet Take 1 tablet Daily for Prostate 90 tablet 3  . furosemide (LASIX) 40 MG tablet Take 1 tablet (40 mg total) by mouth daily. (Patient taking differently: Take 40 mg by mouth 2 (two) times daily. ) 90 tablet 3  . hydroxyurea (HYDREA) 500 MG capsule TAKE 2 CAPSULES ONE TIME DAILY WITH FOOD 180 capsule 2  . levothyroxine (SYNTHROID) 200 MCG tablet Take 1 tablet daily on an empty stomach with only water for 30 minutes & no Antacid meds, Calcium or Magnesium for 4 hours & avoid Biotin 90 tablet 3  . losartan (COZAAR) 100 MG tablet Take 100 mg by mouth daily.    . magnesium oxide (MAG-OX) 400 MG tablet Take by mouth.    . MELATONIN PO Take 1 tablet by mouth as needed.    . metoprolol (TOPROL-XL) 200 MG 24 hr tablet Take 200 mg by mouth daily.    . Multiple Vitamin (MULTIVITAMIN) tablet Take 1 tablet by mouth daily. A-Z    . Omega-3 Fatty Acids (FISH OIL) 1000 MG CAPS Take by mouth.    Marland Kitchen omeprazole (PRILOSEC) 20 MG capsule TAKE 1 CAPSULE TWICE DAILY 180 capsule 3  . polyethylene glycol powder (GLYCOLAX/MIRALAX) 17 GM/SCOOP powder Take 17 g by mouth daily.    . rosuvastatin (CRESTOR) 40 MG tablet Take 1 tablet (40 mg total) by mouth daily. For cholesterol. 90 tablet 1  . spironolactone (ALDACTONE) 25 MG tablet Take 1 tablet    Daily      for BP &  Heart Failure 90 tablet 0  . TURMERIC PO Take 1,000 mg by mouth daily.    Marland Kitchen warfarin (COUMADIN) 5 MG tablet Take 1 to 2 tablets Daily as Directed (Patient taking differently: 5 mg. ) 145 tablet 3  . zinc gluconate 50 MG tablet Take 50 mg by mouth daily.     No current facility-administered medications for this visit.    REVIEW OF SYSTEMS:   A 10+ POINT REVIEW OF SYSTEMS WAS OBTAINED including  neurology, dermatology, psychiatry, cardiac, respiratory, lymph, extremities, GI, GU, Musculoskeletal, constitutional, breasts, reproductive, HEENT.  All pertinent positives are noted in the HPI.  All others are negative.   PHYSICAL EXAMINATION: . Vitals:   01/26/20 1130  BP: 108/64  Pulse: 70  Resp: 18  Temp: (!) 97 F (36.1 C)  SpO2: 97%   Filed Weights   01/26/20 1130  Weight: 266 lb 9.6 oz (120.9 kg)   .Body mass index is 39.37 kg/m.   GENERAL:alert, in no acute distress and comfortable SKIN: no acute rashes, no significant lesions EYES: conjunctiva are pink and non-injected, sclera anicteric OROPHARYNX: MMM, no exudates, no oropharyngeal erythema or ulceration NECK: supple, no JVD LYMPH:  no palpable lymphadenopathy in the cervical, axillary or inguinal regions LUNGS: clear to auscultation b/l with normal respiratory effort HEART: regular rate & rhythm ABDOMEN:  normoactive bowel sounds , non tender, not distended. No palpable hepatosplenomegaly.  Extremity: 1+ pedal edema PSYCH: alert & oriented x 3 with fluent speech NEURO: no focal motor/sensory deficits  LABORATORY DATA:  I have reviewed the data as listed  . CBC Latest Ref Rng & Units 01/26/2020 01/11/2020 10/26/2019  WBC 4.0 - 10.5 K/uL 13.1(H) 13.7(H) 15.1(H)  Hemoglobin 13.0 - 17.0 g/dL 15.8 16.3 16.0  Hematocrit 39 - 52 % 50.1 48.7 50.4  Platelets 150 - 400 K/uL 276 384 326    . CMP Latest Ref Rng & Units 01/26/2020 01/11/2020 10/26/2019  Glucose 70 - 99 mg/dL 143(H) 117(H) 146(H)  BUN 8 - 23 mg/dL 21 22 24(H)  Creatinine 0.61 - 1.24 mg/dL 1.25(H) 1.19(H) 1.15  Sodium 135 - 145 mmol/L 139 135 139  Potassium 3.5 - 5.1 mmol/L 4.1 4.9 4.3  Chloride 98 - 111 mmol/L 100 96(L) 100  CO2 22 - 32 mmol/L 30 32 26  Calcium 8.9 - 10.3 mg/dL 9.6 9.8 9.5  Total Protein 6.5 - 8.1 g/dL 6.6 6.7 6.7  Total Bilirubin 0.3 - 1.2 mg/dL 1.5(H) 1.5(H) 1.3(H)  Alkaline Phos 38 - 126 U/L 60 - 67  AST 15 - 41 U/L 31 30 30   ALT 0 -  44 U/L 30 30 33   . Lab Results  Component Value Date   LDH 442 (H) 12/21/2018    02/09/18 JAK2 Mutation Study:    02/09/18 BCR ABL FISH:    RADIOGRAPHIC STUDIES: I have personally reviewed the radiological images as listed and agreed with the findings in the report. No results found.  ASSESSMENT & PLAN:   79 y.o. male with  1. 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 Some associated leukocytosis. No history of splenectomy. No previous h/o VTE, but significant cardiac risk factors. 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   02/09/18 BCR ABL FISH revealed no evidence of BCR/ABL rearrangement  PLAN: -Discussed pt labwork today, 01/26/20; blood counts are steady, PLT are nml, blood chemistries are steady.  -No indication for therapeutic phlebotomy today - HCT at 50, but is likely overestimated due to diuretics and body habitus.  -Will tentatively set up therapeutic phlebotomy in 3 months.  -Goal for PLT between 200K and 400K - PLT currently at goal.  -The pt has no prohibitive toxicities from continuing 1000 mg Hydroxyurea 7 days/week at this time. -Discussed CDC guidelines regarding COVID19 booster. Advised pt that he is eligible at this time. Recommend he receive ASAP.  -Continue daily baby Aspirin  -Will see back in in 3 months with labs  2. . Patient Active Problem List   Diagnosis Date Noted  . Anal fissure 11/10/2019  . Rectal pain 11/10/2019  . Constipation 11/10/2019  . Rectal bleeding 11/10/2019  . Abnormal glucose 10/12/2019  . Memory changes 01/10/2019  . Polycythemia vera ([place]) 10/21/2018  . Thrombocytosis ([place]) 12/29/2017  . Senile purpura ([place]) 09/01/2017  . Emphysema of lung ([place])  06/01/2017  . Tortuous aorta ([facility]) 06/01/2017  . ASHD/CABG x 3V (11/2013) 02/06/2015  . Acquired thrombophilia ([place]) 02/06/2015  . Cardiac pacemaker/AICD (01/2014) 02/06/2015  . Morbid obesity ([place]) 01/03/2015  . HTN (hypertension) 01/02/2015  . Hyperlipidemia, mixed 01/02/2015  . Vitamin D deficiency 01/02/2015  . GERD  01/02/2015  . BPH (benign prostatic hyperplasia) 01/02/2015  . Medication management 01/02/2015  . Atrial fibrillation ([place]) 01/02/2015  . Hypothyroidism 01/02/2015  . OSA and COPD overlap syndrome ([place]) 01/02/2015  -continue f/u with PCP to optimize atherosclerotic risk factors.   FOLLOW UP: RTC with Dr Irene Limbo with labs and therapeutic phlebotomy in 3 months   The total time spent in the appt was 20 minutes and more than 50% was on counseling and direct patient cares.  All of the patient's questions were answered with apparent satisfaction. The patient knows to call the clinic with any problems, questions or concerns.   Sullivan Lone MD [employer] AAHIVMS [facility] Hematology/Oncology Physician [facility]  (Office):       [phone] (Work cell):  [phone] (Fax):           [phone]  01/26/2020 11:56 AM  I, Yevette Edwards, am acting as a scribe for Dr. Sullivan Lone.   .I have reviewed the above documentation for accuracy and completeness, and I agree with the above. Brunetta Genera MD

## 2020-01-26 ENCOUNTER — Other Ambulatory Visit: Payer: Self-pay

## 2020-01-26 ENCOUNTER — Inpatient Hospital Stay: Payer: Medicare Other | Attending: Hematology | Admitting: Hematology

## 2020-01-26 ENCOUNTER — Inpatient Hospital Stay: Payer: Medicare Other

## 2020-01-26 ENCOUNTER — Telehealth: Payer: Self-pay | Admitting: Hematology

## 2020-01-26 VITALS — BP 108/64 | HR 70 | Temp 97.0°F | Resp 18 | Ht 69.0 in | Wt 266.6 lb

## 2020-01-26 DIAGNOSIS — K219 Gastro-esophageal reflux disease without esophagitis: Secondary | ICD-10-CM | POA: Insufficient documentation

## 2020-01-26 DIAGNOSIS — Z87891 Personal history of nicotine dependence: Secondary | ICD-10-CM | POA: Insufficient documentation

## 2020-01-26 DIAGNOSIS — D45 Polycythemia vera: Secondary | ICD-10-CM

## 2020-01-26 DIAGNOSIS — E559 Vitamin D deficiency, unspecified: Secondary | ICD-10-CM | POA: Diagnosis not present

## 2020-01-26 DIAGNOSIS — J449 Chronic obstructive pulmonary disease, unspecified: Secondary | ICD-10-CM | POA: Diagnosis not present

## 2020-01-26 DIAGNOSIS — Z7901 Long term (current) use of anticoagulants: Secondary | ICD-10-CM | POA: Diagnosis not present

## 2020-01-26 DIAGNOSIS — I4891 Unspecified atrial fibrillation: Secondary | ICD-10-CM | POA: Diagnosis not present

## 2020-01-26 DIAGNOSIS — I10 Essential (primary) hypertension: Secondary | ICD-10-CM | POA: Insufficient documentation

## 2020-01-26 DIAGNOSIS — I509 Heart failure, unspecified: Secondary | ICD-10-CM | POA: Diagnosis not present

## 2020-01-26 DIAGNOSIS — E782 Mixed hyperlipidemia: Secondary | ICD-10-CM | POA: Insufficient documentation

## 2020-01-26 DIAGNOSIS — Z6839 Body mass index (BMI) 39.0-39.9, adult: Secondary | ICD-10-CM | POA: Diagnosis not present

## 2020-01-26 DIAGNOSIS — E039 Hypothyroidism, unspecified: Secondary | ICD-10-CM | POA: Diagnosis not present

## 2020-01-26 DIAGNOSIS — Z951 Presence of aortocoronary bypass graft: Secondary | ICD-10-CM | POA: Insufficient documentation

## 2020-01-26 DIAGNOSIS — E785 Hyperlipidemia, unspecified: Secondary | ICD-10-CM | POA: Diagnosis not present

## 2020-01-26 DIAGNOSIS — I2510 Atherosclerotic heart disease of native coronary artery without angina pectoris: Secondary | ICD-10-CM | POA: Diagnosis not present

## 2020-01-26 LAB — CBC WITH DIFFERENTIAL/PLATELET
Abs Immature Granulocytes: 0.14 10*3/uL — ABNORMAL HIGH (ref 0.00–0.07)
Basophils Absolute: 0.1 10*3/uL (ref 0.0–0.1)
Basophils Relative: 1 %
Eosinophils Absolute: 0.1 10*3/uL (ref 0.0–0.5)
Eosinophils Relative: 1 %
HCT: 50.1 % (ref 39.0–52.0)
Hemoglobin: 15.8 g/dL (ref 13.0–17.0)
Immature Granulocytes: 1 %
Lymphocytes Relative: 10 %
Lymphs Abs: 1.3 10*3/uL (ref 0.7–4.0)
MCH: 32.3 pg (ref 26.0–34.0)
MCHC: 31.5 g/dL (ref 30.0–36.0)
MCV: 102.5 fL — ABNORMAL HIGH (ref 80.0–100.0)
Monocytes Absolute: 0.5 10*3/uL (ref 0.1–1.0)
Monocytes Relative: 3 %
Neutro Abs: 10.9 10*3/uL — ABNORMAL HIGH (ref 1.7–7.7)
Neutrophils Relative %: 84 %
Platelets: 276 10*3/uL (ref 150–400)
RBC: 4.89 MIL/uL (ref 4.22–5.81)
RDW: 15.5 % (ref 11.5–15.5)
WBC: 13.1 10*3/uL — ABNORMAL HIGH (ref 4.0–10.5)
nRBC: 0 % (ref 0.0–0.2)

## 2020-01-26 LAB — CMP (CANCER CENTER ONLY)
ALT: 30 U/L (ref 0–44)
AST: 31 U/L (ref 15–41)
Albumin: 3.9 g/dL (ref 3.5–5.0)
Alkaline Phosphatase: 60 U/L (ref 38–126)
Anion gap: 9 (ref 5–15)
BUN: 21 mg/dL (ref 8–23)
CO2: 30 mmol/L (ref 22–32)
Calcium: 9.6 mg/dL (ref 8.9–10.3)
Chloride: 100 mmol/L (ref 98–111)
Creatinine: 1.25 mg/dL — ABNORMAL HIGH (ref 0.61–1.24)
GFR, Est AFR Am: >60 mL/min
GFR, Estimated: 54 mL/min — ABNORMAL LOW
Glucose, Bld: 143 mg/dL — ABNORMAL HIGH (ref 70–99)
Potassium: 4.1 mmol/L (ref 3.5–5.1)
Sodium: 139 mmol/L (ref 135–145)
Total Bilirubin: 1.5 mg/dL — ABNORMAL HIGH (ref 0.3–1.2)
Total Protein: 6.6 g/dL (ref 6.5–8.1)

## 2020-01-26 NOTE — Telephone Encounter (Signed)
Scheduled appointment per 9/9 los. Patient is aware of appointments and I gave him an updated calendar.

## 2020-01-30 DIAGNOSIS — I480 Paroxysmal atrial fibrillation: Secondary | ICD-10-CM | POA: Diagnosis not present

## 2020-01-30 DIAGNOSIS — I255 Ischemic cardiomyopathy: Secondary | ICD-10-CM | POA: Diagnosis not present

## 2020-01-30 DIAGNOSIS — Z7901 Long term (current) use of anticoagulants: Secondary | ICD-10-CM | POA: Diagnosis not present

## 2020-01-30 DIAGNOSIS — Z95810 Presence of automatic (implantable) cardiac defibrillator: Secondary | ICD-10-CM | POA: Diagnosis not present

## 2020-02-03 DIAGNOSIS — Z23 Encounter for immunization: Secondary | ICD-10-CM | POA: Diagnosis not present

## 2020-02-13 DIAGNOSIS — I480 Paroxysmal atrial fibrillation: Secondary | ICD-10-CM | POA: Diagnosis not present

## 2020-02-13 DIAGNOSIS — I255 Ischemic cardiomyopathy: Secondary | ICD-10-CM | POA: Diagnosis not present

## 2020-02-13 DIAGNOSIS — I350 Nonrheumatic aortic (valve) stenosis: Secondary | ICD-10-CM | POA: Diagnosis not present

## 2020-02-13 DIAGNOSIS — Z95810 Presence of automatic (implantable) cardiac defibrillator: Secondary | ICD-10-CM | POA: Diagnosis not present

## 2020-02-27 DIAGNOSIS — Z95810 Presence of automatic (implantable) cardiac defibrillator: Secondary | ICD-10-CM | POA: Diagnosis not present

## 2020-02-27 DIAGNOSIS — I480 Paroxysmal atrial fibrillation: Secondary | ICD-10-CM | POA: Diagnosis not present

## 2020-02-27 DIAGNOSIS — Z7901 Long term (current) use of anticoagulants: Secondary | ICD-10-CM | POA: Diagnosis not present

## 2020-02-27 DIAGNOSIS — I255 Ischemic cardiomyopathy: Secondary | ICD-10-CM | POA: Diagnosis not present

## 2020-02-29 DIAGNOSIS — I255 Ischemic cardiomyopathy: Secondary | ICD-10-CM | POA: Diagnosis not present

## 2020-03-07 DIAGNOSIS — K602 Anal fissure, unspecified: Secondary | ICD-10-CM | POA: Diagnosis not present

## 2020-03-14 DIAGNOSIS — Z23 Encounter for immunization: Secondary | ICD-10-CM | POA: Diagnosis not present

## 2020-03-27 ENCOUNTER — Other Ambulatory Visit: Payer: Self-pay | Admitting: Internal Medicine

## 2020-04-02 DIAGNOSIS — I480 Paroxysmal atrial fibrillation: Secondary | ICD-10-CM | POA: Diagnosis not present

## 2020-04-02 DIAGNOSIS — Z7901 Long term (current) use of anticoagulants: Secondary | ICD-10-CM | POA: Diagnosis not present

## 2020-04-02 DIAGNOSIS — I255 Ischemic cardiomyopathy: Secondary | ICD-10-CM | POA: Diagnosis not present

## 2020-04-02 DIAGNOSIS — Z95810 Presence of automatic (implantable) cardiac defibrillator: Secondary | ICD-10-CM | POA: Diagnosis not present

## 2020-04-04 DIAGNOSIS — R079 Chest pain, unspecified: Secondary | ICD-10-CM | POA: Diagnosis not present

## 2020-04-06 DIAGNOSIS — I255 Ischemic cardiomyopathy: Secondary | ICD-10-CM | POA: Diagnosis not present

## 2020-04-06 DIAGNOSIS — Z95810 Presence of automatic (implantable) cardiac defibrillator: Secondary | ICD-10-CM | POA: Diagnosis not present

## 2020-04-06 DIAGNOSIS — I480 Paroxysmal atrial fibrillation: Secondary | ICD-10-CM | POA: Diagnosis not present

## 2020-04-18 DIAGNOSIS — H52223 Regular astigmatism, bilateral: Secondary | ICD-10-CM | POA: Diagnosis not present

## 2020-04-18 DIAGNOSIS — H16223 Keratoconjunctivitis sicca, not specified as Sjogren's, bilateral: Secondary | ICD-10-CM | POA: Diagnosis not present

## 2020-04-18 DIAGNOSIS — Z961 Presence of intraocular lens: Secondary | ICD-10-CM | POA: Diagnosis not present

## 2020-04-23 ENCOUNTER — Other Ambulatory Visit: Payer: Self-pay

## 2020-04-23 ENCOUNTER — Inpatient Hospital Stay (HOSPITAL_BASED_OUTPATIENT_CLINIC_OR_DEPARTMENT_OTHER): Payer: Medicare Other | Admitting: Hematology

## 2020-04-23 ENCOUNTER — Inpatient Hospital Stay: Payer: Medicare Other

## 2020-04-23 ENCOUNTER — Inpatient Hospital Stay: Payer: Medicare Other | Attending: Hematology

## 2020-04-23 VITALS — BP 124/64 | HR 69 | Temp 97.4°F | Resp 17 | Ht 69.0 in | Wt 264.4 lb

## 2020-04-23 DIAGNOSIS — D45 Polycythemia vera: Secondary | ICD-10-CM | POA: Insufficient documentation

## 2020-04-23 DIAGNOSIS — I2510 Atherosclerotic heart disease of native coronary artery without angina pectoris: Secondary | ICD-10-CM | POA: Diagnosis not present

## 2020-04-23 LAB — CMP (CANCER CENTER ONLY)
ALT: 23 U/L (ref 0–44)
AST: 25 U/L (ref 15–41)
Albumin: 3.9 g/dL (ref 3.5–5.0)
Alkaline Phosphatase: 64 U/L (ref 38–126)
Anion gap: 6 (ref 5–15)
BUN: 18 mg/dL (ref 8–23)
CO2: 30 mmol/L (ref 22–32)
Calcium: 9.5 mg/dL (ref 8.9–10.3)
Chloride: 101 mmol/L (ref 98–111)
Creatinine: 1.2 mg/dL (ref 0.61–1.24)
GFR, Estimated: >60 mL/min (ref >60)
Glucose, Bld: 141 mg/dL — ABNORMAL HIGH (ref 70–99)
Potassium: 4.3 mmol/L (ref 3.5–5.1)
Sodium: 137 mmol/L (ref 135–145)
Total Bilirubin: 1.4 mg/dL — ABNORMAL HIGH (ref 0.3–1.2)
Total Protein: 6.8 g/dL (ref 6.5–8.1)

## 2020-04-23 LAB — CBC WITH DIFFERENTIAL/PLATELET
Abs Immature Granulocytes: 0.19 10*3/uL — ABNORMAL HIGH (ref 0.00–0.07)
Basophils Absolute: 0.1 10*3/uL (ref 0.0–0.1)
Basophils Relative: 1 %
Eosinophils Absolute: 0.1 10*3/uL (ref 0.0–0.5)
Eosinophils Relative: 1 %
HCT: 48.5 % (ref 39.0–52.0)
Hemoglobin: 15.7 g/dL (ref 13.0–17.0)
Immature Granulocytes: 1 %
Lymphocytes Relative: 12 %
Lymphs Abs: 1.7 10*3/uL (ref 0.7–4.0)
MCH: 33 pg (ref 26.0–34.0)
MCHC: 32.4 g/dL (ref 30.0–36.0)
MCV: 101.9 fL — ABNORMAL HIGH (ref 80.0–100.0)
Monocytes Absolute: 0.6 10*3/uL (ref 0.1–1.0)
Monocytes Relative: 4 %
Neutro Abs: 11.8 10*3/uL — ABNORMAL HIGH (ref 1.7–7.7)
Neutrophils Relative %: 81 %
Platelets: 308 10*3/uL (ref 150–400)
RBC: 4.76 MIL/uL (ref 4.22–5.81)
RDW: 15.1 % (ref 11.5–15.5)
WBC: 14.5 10*3/uL — ABNORMAL HIGH (ref 4.0–10.5)
nRBC: 0 % (ref 0.0–0.2)

## 2020-04-23 NOTE — Progress Notes (Signed)
HEMATOLOGY/ONCOLOGY CLINIC NOTE  Date of Service: 04/23/2020  Patient Care Team: [name], MD as PCP - General (Internal Medicine) Georg Ruddle, Ashok Cordia, MD as Referring Physician (Cardiology) Shirlee More Ronalee Red, MD as Referring Physician (Cardiology) Brunetta Genera, MD as Consulting Physician (Hematology)  CHIEF COMPLAINTS/PURPOSE OF CONSULTATION:  F/u for polycythemia vera  HISTORY OF PRESENTING ILLNESS:   [name] [name] is a wonderful 79 y.o. male who has been referred to [place] by Dr. [name]  for evaluation and management of Thrombocytosis. He is accompanied today by his wife. The pt reports that he is doing well overall.   The pt reports that he has never had any blood clots.  He has atrial fibrillation and has taken Coumadin since 2015. He had heart surgery in 2015, and had his ICD placed in 2016. The pt also takes 81g aspirin daily.   The pt notes that he has not been aware of his high platelets previously, but only as of his most recent labs. He denies any recent major bleeds, surgeries, and other trauma. He denies having a splenectomy at any time as well.   The pt notes that his right kidney gets sore from time to time when he doesn't stay well hydrated. He also notes that when he urinates at these times, his urine is very dark. The pt adds that he had kidney stones one time. The pt is also taking Lasix and notes that he urinates frequently.   The pt adds that his gums have been very sore, even to touch from his tongue.  The pt notes that he does not want to use a CPAP after a bad experience with a previous sleep study that did diagnose him with sleep apnea. He notes that he falls asleep several times a day as well, falling asleep easily when he sits down, and denying falling asleep while driving. He also notes some increased difficulty remembering.   Most recent lab results (12/09/17) of CBC is as follows: all values are WNL except for WBC at 11.9k, RDW at  18.4, PLT at 649k, ANC at 9.7k.  On review of systems, pt reports good energy levels, falling asleep several times a day, staying active, and denies recent surgery, recent trauma, bleeding, unexpected weight loss, pain along the spine, abdominal pains, and any other symptoms.   On Family Hx the pt reports prostate cancer.   Interval History:  [name] [name] returns today for management and evaluation of his JAK2 positive MPN. The patient's last visit with [place] was on 01/26/2020. The pt reports that he is doing well overall.  The pt reports a significant amount of dyspepsia that is causing changes in his appetite. Pt also notes frequent urination and changes in bowel habits. Pt has been taking Hydroxyurea with his dinner. He is also currently on Finasteride. He admits that he has some blood with his stools but thinks that this is due to his unhealed anal fissure. Pt denies any recent infection symptoms.  Lab results today (04/23/20) of CBC w/diff and CMP is as follows: all values are WNL except for WBC at 14.5K, MCV at 101.9, Neutro Abs at 11.8K, Abs Immature Granulocytes at 0.19K, Glucose at 141, Total Bilirubin at 1.4.  On review of systems, pt reports dyspepsia, bloody stools, polyuria and denies black stools, fevers, chills and any other symptoms.   MEDICAL HISTORY:  Past Medical History:  Diagnosis Date  . A-fib ([place]) 01/2014  . AICD (automatic cardioverter/defibrillator) present  [employer]  . Arthritis   . ASHD (arteriosclerotic heart disease) 2015   s/p CABG  . Asthma   . CAD (coronary artery disease)   . Cataract    s/p left  . CHF (congestive heart failure) ([place])   . COPD (chronic obstructive pulmonary disease) ([place])    by CXR, pt unsure of this  . Diverticulosis   . GERD (gastroesophageal reflux disease)   . Gout 2014  . Hyperlipidemia   . Hypertension   . Hypothyroidism   . LBBB (left bundle branch block)   . Polycythemia   . Pre-diabetes   . Prediabetes  01/2014  . Presence of permanent cardiac pacemaker   . Sleep apnea    no CPAP  . Thyroid disease    hypothyroid    SURGICAL HISTORY: Past Surgical History:  Procedure Laterality Date  . CARDIAC DEFIBRILLATOR PLACEMENT  07/2014  . CATARACT EXTRACTION W/ INTRAOCULAR LENS IMPLANT Left   . CORONARY ARTERY BYPASS GRAFT  11/2013  . lumb laminectomy  [phone]   x 3  . LUMBAR FUSION  2013  . NASAL SEPTOPLASTY W/ TURBINOPLASTY Bilateral 01/30/2016   Procedure: NASAL SEPTOPLASTY WITH BILATERAL TURBINATE REDUCTION;  Surgeon: Rozetta Nunnery, MD;  Location: [place];  Service: ENT;  Laterality: Bilateral;  . TONSILLECTOMY    . UVULECTOMY N/A 01/30/2016   Procedure: UVULECTOMY;  Surgeon: Rozetta Nunnery, MD;  Location: [facility] OR;  Service: ENT;  Laterality: N/A;    SOCIAL HISTORY: Social History   Socioeconomic History  . Marital status: Married    Spouse name: [name]   . Number of children: 2  . Years of education: 110  . Highest education level: Not on file  Occupational History  . Occupation: Retired  Tobacco Use  . Smoking status: Former Smoker    Packs/day: 2.00    Years: 25.00    Pack years: 50.00    Types: Cigarettes    Quit date: 05/20/1983    Years since quitting: 36.9  . Smokeless tobacco: Never Used  Vaping Use  . Vaping Use: Never used  Substance and Sexual Activity  . Alcohol use: Not Currently    Alcohol/week: 0.0 standard drinks  . Drug use: No  . Sexual activity: Not on file  Other Topics Concern  . Not on file  Social History Narrative   Lives with wife, [name]   Caffeine use: daily (Coffee)   Social Determinants of Health   Financial Resource Strain:   . Difficulty of Paying Living Expenses: Not on file  Food Insecurity:   . Worried About Charity fundraiser in the Last Year: Not on file  . Ran Out of Food in the Last Year: Not on file  Transportation Needs:   . Lack of Transportation (Medical): Not on file  . Lack of Transportation (Non-Medical):  Not on file  Physical Activity:   . Days of Exercise per Week: Not on file  . Minutes of Exercise per Session: Not on file  Stress:   . Feeling of Stress : Not on file  Social Connections:   . Frequency of Communication with Friends and Family: Not on file  . Frequency of Social Gatherings with Friends and Family: Not on file  . Attends Religious Services: Not on file  . Active Member of Clubs or Organizations: Not on file  . Attends Archivist Meetings: Not on file  . Marital Status: Not on file  Intimate Partner Violence:   . Fear of  Current or Ex-Partner: Not on file  . Emotionally Abused: Not on file  . Physically Abused: Not on file  . Sexually Abused: Not on file    FAMILY HISTORY: Family History  Problem Relation Age of Onset  . Alzheimer's disease Mother   . Mental illness Mother   . Depression Mother   . Heart disease Father 28       had 5 MI's & died 5 yo  . Hypertension Father   . Alcohol abuse Son   . Stroke Maternal Grandmother   . Dementia Maternal Grandmother   . Heart attack Son     ALLERGIES:  is allergic to other.  MEDICATIONS:  Current Outpatient Medications  Medication Sig Dispense Refill  . acetaminophen (TYLENOL) 500 MG tablet Take 500 mg by mouth as needed for mild pain.     Marland Kitchen albuterol (PROVENTIL) (2.5 MG/3ML) 0.083% nebulizer solution USE 1 VIAL IN NEBULIZER EVERY 6 HOURS AS NEEDED FOR WHEEZING OR SHORTNESS OF BREATH 75 mL 0  . allopurinol (ZYLOPRIM) 300 MG tablet Take      1 tablet      Daily       to Prevent Gout 90 tablet 3  . AMBULATORY NON FORMULARY MEDICATION Nitroglycerine ointment 0.125 % - Apply a pea sized amount to your rectum to the first knuckle three times daily. 30 g 3  . aspirin EC 81 MG tablet Take 81 mg by mouth daily.    . Cholecalciferol (VITAMIN D PO) Take 10,000 Units by mouth daily.     . diphenhydrAMINE (BENADRYL) 25 MG tablet Take 25 mg by mouth at bedtime.    . Docosahexaenoic Acid (DHA PO) Take 500 mg by  mouth daily. + EPA 250 mg    . docusate sodium (COLACE) 100 MG capsule Take 100 mg by mouth 2 (two) times daily. 1 tablet in the morning and 1 tablet at bedtime    . doxazosin (CARDURA) 4 MG tablet TAKE 1 TABLET EVERY DAY 90 tablet 3  . finasteride (PROSCAR) 5 MG tablet Take     1 tablet       Daily        for Prostate 90 tablet 0  . furosemide (LASIX) 40 MG tablet Take 1 tablet (40 mg total) by mouth daily. (Patient taking differently: Take 40 mg by mouth 2 (two) times daily. ) 90 tablet 3  . hydroxyurea (HYDREA) 500 MG capsule TAKE 2 CAPSULES ONE TIME DAILY WITH FOOD 180 capsule 2  . levothyroxine (SYNTHROID) 200 MCG tablet Take      1 tablet       Daily       on an empty stomach with only water for 30 minutes & no Antacid meds, Calcium or Magnesium for 4 hours & avoid Biotin 90 tablet 0  . losartan (COZAAR) 100 MG tablet Take 100 mg by mouth daily.    . magnesium oxide (MAG-OX) 400 MG tablet Take by mouth.    . MELATONIN PO Take 1 tablet by mouth as needed.    . metoprolol (TOPROL-XL) 200 MG 24 hr tablet Take 200 mg by mouth daily.    . Multiple Vitamin (MULTIVITAMIN) tablet Take 1 tablet by mouth daily. A-Z    . Omega-3 Fatty Acids (FISH OIL) 1000 MG CAPS Take by mouth.    Marland Kitchen omeprazole (PRILOSEC) 20 MG capsule Take     1 capsule     2 x /day (every 12 hours)       to  Prevent Heartburn and Acid Reflux 180 capsule 0  . polyethylene glycol powder (GLYCOLAX/MIRALAX) 17 GM/SCOOP powder Take 17 g by mouth daily.    . rosuvastatin (CRESTOR) 40 MG tablet Take 1 tablet (40 mg total) by mouth daily. For cholesterol. 90 tablet 1  . spironolactone (ALDACTONE) 25 MG tablet Take       1 tablet        Daily        for BP & Heart Failure 90 tablet 0  . TURMERIC PO Take 1,000 mg by mouth daily.    Marland Kitchen warfarin (COUMADIN) 5 MG tablet Take 1 to 2 tablets Daily as Directed (Patient taking differently: 5 mg. ) 145 tablet 3  . zinc gluconate 50 MG tablet Take 50 mg by mouth daily.     No current  facility-administered medications for this visit.    REVIEW OF SYSTEMS:   A 10+ POINT REVIEW OF SYSTEMS WAS OBTAINED including neurology, dermatology, psychiatry, cardiac, respiratory, lymph, extremities, GI, GU, Musculoskeletal, constitutional, breasts, reproductive, HEENT.  All pertinent positives are noted in the HPI.  All others are negative.   PHYSICAL EXAMINATION: Vitals:   04/23/20 1501  BP: 124/64  Pulse: 69  Resp: 17  Temp: (!) 97.4 F (36.3 C)  SpO2: 97%   Filed Weights   04/23/20 1501  Weight: 264 lb 6.4 oz (119.9 kg)   .Body mass index is 39.05 kg/m.   Exam was given in a chair   GENERAL:alert, in no acute distress and comfortable SKIN: no acute rashes, no significant lesions EYES: conjunctiva are pink and non-injected, sclera anicteric OROPHARYNX: MMM, no exudates, no oropharyngeal erythema or ulceration NECK: supple, no JVD LYMPH:  no palpable lymphadenopathy in the cervical, axillary or inguinal regions LUNGS: clear to auscultation b/l with normal respiratory effort HEART: regular rate & rhythm ABDOMEN:  normoactive bowel sounds , non tender, not distended. No palpable hepatosplenomegaly.  Extremity: no pedal edema PSYCH: alert & oriented x 3 with fluent speech NEURO: no focal motor/sensory deficits  LABORATORY DATA:  I have reviewed the data as listed  . CBC Latest Ref Rng & Units 04/23/2020 01/26/2020 01/11/2020  WBC 4.0 - 10.5 K/uL 14.5(H) 13.1(H) 13.7(H)  Hemoglobin 13.0 - 17.0 g/dL 15.7 15.8 16.3  Hematocrit 39 - 52 % 48.5 50.1 48.7  Platelets 150 - 400 K/uL 308 276 384    . CMP Latest Ref Rng & Units 04/23/2020 01/26/2020 01/11/2020  Glucose 70 - 99 mg/dL 141(H) 143(H) 117(H)  BUN 8 - 23 mg/dL 18 21 22   Creatinine 0.61 - 1.24 mg/dL 1.20 1.25(H) 1.19(H)  Sodium 135 - 145 mmol/L 137 139 135  Potassium 3.5 - 5.1 mmol/L 4.3 4.1 4.9  Chloride 98 - 111 mmol/L 101 100 96(L)  CO2 22 - 32 mmol/L 30 30 32  Calcium 8.9 - 10.3 mg/dL 9.5 9.6 9.8  Total  Protein 6.5 - 8.1 g/dL 6.8 6.6 6.7  Total Bilirubin 0.3 - 1.2 mg/dL 1.4(H) 1.5(H) 1.5(H)  Alkaline Phos 38 - 126 U/L 64 60 -  AST 15 - 41 U/L 25 31 30   ALT 0 - 44 U/L 23 30 30    . Lab Results  Component Value Date   LDH 442 (H) 12/21/2018    02/09/18 JAK2 Mutation Study:    02/09/18 BCR ABL FISH:    RADIOGRAPHIC STUDIES: I have personally reviewed the radiological images as listed and agreed with the findings in the report. No results found.  ASSESSMENT & PLAN:   79 y.o.  male with  1. 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 Some associated leukocytosis. No history of splenectomy. No previous h/o VTE, but significant cardiac risk factors. 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   02/09/18 BCR ABL FISH revealed no evidence of BCR/ABL rearrangement   PLAN: -Discussed pt labwork today, 04/23/20; WBC are elevated, no anemia, PLT are normal, blood chemistries are nml.  -No indication for therapeutic phlebotomy today as HCT at 48.5. -Goal for PLT between 200K and 400K - PLT currently at goal.  -The pt has no prohibitive toxicities from continuing 1000 mg Hydroxyurea 7 days/week at this time. -Advised pt that most anal fissures heal on their own with rest, if straining is avoided. -Recommend pt discuss current medications with PCP. -Recommend OTC probiotic for upset stomach. -Continue daily baby Aspirin  -Will see back in 3 months with labs  2. . Patient Active Problem List   Diagnosis Date Noted  . Anal fissure 11/10/2019  . Rectal pain 11/10/2019  . Constipation 11/10/2019  . Rectal bleeding 11/10/2019  . Abnormal glucose 10/12/2019  . Memory changes 01/10/2019  . Polycythemia vera ([place])  10/21/2018  . Thrombocytosis 12/29/2017  . Senile purpura ([place]) 09/01/2017  . Emphysema of lung ([employer]) 06/01/2017  . Tortuous aorta ([facility]) 06/01/2017  . ASHD/CABG x 3V (11/2013) 02/06/2015  . Acquired thrombophilia ([place]) 02/06/2015  . Cardiac pacemaker/AICD (01/2014) 02/06/2015  . Morbid obesity ([place]) 01/03/2015  . HTN (hypertension) 01/02/2015  . Hyperlipidemia, mixed 01/02/2015  . Vitamin D deficiency 01/02/2015  . GERD  01/02/2015  . BPH (benign prostatic hyperplasia) 01/02/2015  . Medication management 01/02/2015  . Atrial fibrillation ([place]) 01/02/2015  . Hypothyroidism 01/02/2015  . OSA and COPD overlap syndrome ([place]) 01/02/2015  -continue f/u with PCP to optimize atherosclerotic risk factors.   FOLLOW UP: RTC with Dr Irene Limbo with labs in 3 months   The total time spent in the appt was 20 minutes and more than 50% was on counseling and direct patient cares.  All of the patient's questions were answered with apparent satisfaction. The patient knows to call the clinic with any problems, questions or concerns.   Sullivan Lone MD [place] AAHIVMS [facility] Hematology/Oncology Physician [facility]  (Office):       [phone] (Work cell):  [phone] (Fax):           [phone]  04/23/2020 4:24 PM  I, Yevette Edwards, am acting as a scribe for Dr. Sullivan Lone.   .I have reviewed the above documentation for accuracy and completeness, and I agree with the above. Brunetta Genera MD

## 2020-05-07 DIAGNOSIS — Z95810 Presence of automatic (implantable) cardiac defibrillator: Secondary | ICD-10-CM | POA: Diagnosis not present

## 2020-05-07 DIAGNOSIS — I255 Ischemic cardiomyopathy: Secondary | ICD-10-CM | POA: Diagnosis not present

## 2020-05-07 DIAGNOSIS — I480 Paroxysmal atrial fibrillation: Secondary | ICD-10-CM | POA: Diagnosis not present

## 2020-05-07 DIAGNOSIS — Z7901 Long term (current) use of anticoagulants: Secondary | ICD-10-CM | POA: Diagnosis not present

## 2020-05-08 ENCOUNTER — Encounter: Payer: Self-pay | Admitting: Internal Medicine

## 2020-05-08 DIAGNOSIS — K602 Anal fissure, unspecified: Secondary | ICD-10-CM | POA: Diagnosis not present

## 2020-05-08 NOTE — Patient Instructions (Signed)

## 2020-05-08 NOTE — Progress Notes (Signed)
Annual  Screening/Preventative Visit  & Comprehensive Evaluation & Examination      This very nice 79 y.o.   MWM  presents for a Screening /Preventative Visit & comprehensive evaluation and management of multiple medical co-morbidities.  Patient has been followed for HTN, ASHD HLD, Prediabetes and Vitamin D Deficiency.  He also has COPD and overlap with OSA, but is intolerant to CPAP.  Patient's Gout is quiescent on his Allopurinol & his GERD is likewise controlled with his Pantoprazole.     Patient is followed by Dr Sullivan Lone at the [facility] and is on Hydrea for Polycythemia Vera with thrombocythemia.      HTN predates since age 73 in 62 . Patient's BP has been controlled and today's BP is at goal  -  106/70. patient has ASCAD s/p CABG (2015) . Patient is on Coumadin for hx/o pAfib  followed at his cardiology office in [facility].  Patient had a AICD /Defibrillator implanted in Mar 2016.   Cardiac echo in June 2021 showed a bicuspid Aortic valve with moderate stenosis. Also he has Patient denies any cardiac symptoms as chest pain, palpitations, shortness of breath, dizziness or ankle swelling.      Patient's hyperlipidemia is controlled with diet and  rosuvastatin. Patient denies myalgias or other medication SE's. Last lipids were at goal: Lab Results  Component Value Date   CHOL 131 01/11/2020   HDL 22 (L) 01/11/2020   LDLCALC 84 01/11/2020   TRIG 151 (H) 01/11/2020   CHOLHDL 6.0 (H) 01/11/2020        Patient has hx/o  Morbid Obesity (BMI 39+) and prediabetes (A1c 6.0% /2015) and patient denies reactive hypoglycemic symptoms, visual blurring, diabetic polys or paresthesias. Last A1c was near goal:   Lab Results  Component Value Date   HGBA1C 5.7 (H) 10/18/2019         Finally, patient has history of Vitamin D Deficiency ("39" /2016)  and last vitamin D was at goal:   Lab Results  Component Value Date   VD25OH 76 10/18/2019    Current Outpatient  Medications on File Prior to Visit  Medication Sig  . acetaminophen (TYLENOL) 500 MG tablet Take as needed for mild pain.   . Albuterol (2.5 MG/3ML) nebulizer solution USE 1 VIAL IN NEBULIZER EVERY 6 HOURS AS NEEDED FOR WHEEZING OR SHORTNESS OF BREATH  . allopurinol  300 MG tablet Take      1 tablet      Daily       to Prevent Gout  . aspirin EC 81 MG tablet Take  daily.  Marland Kitchen VITAMIN D  Take 10,000 Units  daily.   . diphenhydrAMINE  25 MG tablet Take  at bedtime.  . Docosahexaenoic Acid -DHA  Take 500 m daily. + EPA 250 mg  . COLACE 100 MG capsule Take 100 mg 2 (two) times daily.   Marland Kitchen doxazosin  4 MG tablet TAKE 1 TABLET EVERY DAY  . finasteride 5 MG tablet Take     1 tablet       Daily        for Prostate  . furosemide  40 MG tablet Take 1 tablet  2  times daily  . HYDREA 500 MG capsule TAKE 2 CAPSULES  DAILY   . levothyroxine 200 MCG tablet Take      1 tablet       Daily   . losartan  100 MG tablet Take 100 mg daily.  Marland Kitchen  magnesium oxide  400 MG tablet Take   . MELATONIN PO Take 1 tablet  as needed.  . metoprolol-XL) 200 MG  Take 200 mg  daily.  . Multiple Vitamin  tablet Take 1 tablet  daily. A-Z  . Omega-3 FISH OIL 1000 MG CAPS Take .  Marland Kitchen omeprazole  20 MG capsule Take   1 capsule 2 x /day (every 12 hrs)         . polyethylene glycol powder Take 17 g  daily.  . rosuvastatin  40 MG tablet Take 1 tablet daily. For cholesterol.  Marland Kitchen spironolactone 25 MG tablet Take       1 tablet        Daily   . TURMERIC  1,000 mg  Take daily.  Marland Kitchen warfarin  5 MG tablet Patient taking differently: 5 mg  . zinc  50 MG tablet Take  daily.    Allergies  Allergen Reactions  . Other Swelling    SODIUM SENSITIVE    Past Medical History:  Diagnosis Date  . A-fib (HCC) 01/2014  . AICD (automatic cardioverter/defibrillator) present    [employer]  . Arthritis   . ASHD (arteriosclerotic heart disease) 2015   s/p CABG  . Asthma   . CAD (coronary artery disease)   . Cataract    s/p left  . CHF  (congestive heart failure) (HCC)   . COPD (chronic obstructive pulmonary disease) (HCC)    by CXR, pt unsure of this  . Diverticulosis   . GERD (gastroesophageal reflux disease)   . Gout 2014  . Hyperlipidemia   . Hypertension   . Hypothyroidism   . LBBB (left bundle branch block)   . Polycythemia   . Pre-diabetes   . Prediabetes 01/2014  . Presence of permanent cardiac pacemaker   . Sleep apnea    no CPAP  . Thyroid disease    hypothyroid   Health Maintenance  Topic Date Due  . Hepatitis C Screening  Never done  . TETANUS/TDAP  Never done  . COVID-19 Vaccine (3 - Moderna risk 4-dose series) 08/15/2019  . INFLUENZA VACCINE  Completed  . PNA vac Low Risk Adult  Completed   Immunization History  Administered Date(s) Administered  . Influenza, High Dose Seasonal PF 04/23/2015, 02/18/2017, 03/29/2018, 02/12/2019  . Influenza,inj,Quad PF,6+ Mos 01/31/2016  . Moderna Sars-Covid-2 Vaccination 06/18/2019, 07/18/2019  . Pneumococcal Conjugate-13 11/21/2014  . Pneumococcal Polysaccharide-23 06/01/2017   Last Colon - 11/11/2006 - Dr Russella Dar recc f/u &  sent a recall letter in 2018   Cologard 11/06/2016 Negative - & recc a 3 year f/u due June 2021   Past Surgical History:  Procedure Laterality Date  . CARDIAC DEFIBRILLATOR PLACEMENT  07/2014  . CATARACT EXTRACTION W/ INTRAOCULAR LENS IMPLANT Left   . CORONARY ARTERY BYPASS GRAFT  11/2013  . lumb laminectomy  [phone]   x 3  . LUMBAR FUSION  2013  . NASAL SEPTOPLASTY W/ TURBINOPLASTY Bilateral 01/30/2016   Procedure: NASAL SEPTOPLASTY WITH BILATERAL TURBINATE REDUCTION;  Surgeon: Drema Halon, MD;  Location: [facility] OR;  Service: ENT;  Laterality: Bilateral;  . TONSILLECTOMY    . UVULECTOMY N/A 01/30/2016   Procedure: UVULECTOMY;  Surgeon: Drema Halon, MD;  Location: [facility] OR;  Service: ENT;  Laterality: N/A;   Family History  Problem Relation Age of Onset  . Alzheimer's disease Mother   . Mental illness Mother    . Depression Mother   . Heart disease Father 62  had 5 MI's & died 63 yo  . Hypertension Father   . Alcohol abuse Son   . Stroke Maternal Grandmother   . Dementia Maternal Grandmother   . Heart attack Son    Social History   Socioeconomic History  . Marital status: Married    Spouse name: [name]   . Number of children: 2  . Years of education: 24  . Highest education level: Not on file  Occupational History  . Occupation: Retired  Tobacco Use  . Smoking status: Former Smoker    Packs/day: 2.00    Years: 25.00    Pack years: 50.00    Types: Cigarettes    Quit date: 05/20/1983    Years since quitting: 36.9  . Smokeless tobacco: Never Used  Vaping Use  . Vaping Use: Never used  Substance and Sexual Activity  . Alcohol use: Not Currently    Alcohol/week: 0.0 standard drinks  . Drug use: No  . Sexual activity: Not on file  Social History Narrative   Lives with wife, [name]   Caffeine use: daily (Coffee)    ROS Constitutional: Denies fever, chills, weight loss/gain, headaches, insomnia,  night sweats or change in appetite. Does c/o fatigue. Eyes: Denies redness, blurred vision, diplopia, discharge, itchy or watery eyes.  ENT: Denies discharge, congestion, post nasal drip, epistaxis, sore throat, earache, hearing loss, dental pain, Tinnitus, Vertigo, Sinus pain or snoring.  Cardio: Denies chest pain, palpitations, irregular heartbeat, syncope, dyspnea, diaphoresis, orthopnea, PND, claudication or edema Respiratory: denies cough, dyspnea, DOE, pleurisy, hoarseness, laryngitis or wheezing.  Gastrointestinal: Denies dysphagia, heartburn, reflux, water brash, pain, cramps, nausea, vomiting, bloating, diarrhea, constipation, hematemesis, melena, hematochezia, jaundice or hemorrhoids Genitourinary: Denies dysuria, frequency, urgency, nocturia, hesitancy, discharge, hematuria or flank pain Musculoskeletal: Denies arthralgia, myalgia, stiffness, Jt. Swelling, pain, limp or  strain/sprain. Denies Falls. Skin: Denies puritis, rash, hives, warts, acne, eczema or change in skin lesion Neuro: No weakness, tremor, incoordination, spasms, paresthesia or pain Psychiatric: Denies confusion, memory loss or sensory loss. Denies Depression. Endocrine: Denies change in weight, skin, hair change, nocturia, and paresthesia, diabetic polys, visual blurring or hyper / hypo glycemic episodes.  Heme/Lymph: No excessive bleeding, bruising or enlarged lymph nodes.  Physical Exam  BP 106/70   Pulse 71   Temp (!) 97 F (36.1 C)   Resp 18   Ht 5\' 9"  (1.753 m)   Wt 260 lb 9.6 oz (118.2 kg)   SpO2 96%   BMI 38.48 kg/m   General Appearance: Well nourished and well groomed and in no apparent distress.  Eyes: PERRLA, EOMs, conjunctiva no swelling or erythema, normal fundi and vessels. Sinuses: No frontal/maxillary tenderness ENT/Mouth: EACs patent / TMs  nl. Nares clear without erythema, swelling, mucoid exudates. Oral hygiene is good. No erythema, swelling, or exudate. Tongue normal, non-obstructing. Tonsils not swollen or erythematous. Hearing normal.  Neck: Supple, thyroid not palpable. No bruits, nodes or JVD. Respiratory: Respiratory effort normal.  BS equal and clear bilateral without rales, rhonci, wheezing or stridor. Cardio: Heart sounds are normal with regular rate and rhythm and no murmurs, rubs or gallops. Peripheral pulses are normal and equal bilaterally without edema. No aortic or femoral bruits. Chest: symmetric with normal excursions and percussion.  Abdomen: Soft, with Nl bowel sounds. Nontender, no guarding, rebound, hernias, masses, or organomegaly.  Lymphatics: Non tender without lymphadenopathy.  Musculoskeletal: Full ROM all peripheral extremities, joint stability, 5/5 strength, and normal gait. Skin: Warm and dry without rashes, lesions, cyanosis, clubbing or  ecchymosis.  Neuro: Cranial nerves intact, reflexes equal bilaterally. Normal muscle tone, no  cerebellar symptoms. Sensation intact.  Pysch: Alert and oriented X 3 with normal affect, insight and judgment appropriate.   Assessment and Plan    1. Essential hypertension  - EKG 12-Lead - [place], RETROPERITNL ABD,  LTD - Urinalysis, Routine w reflex microscopic - Microalbumin / creatinine urine ratio - Magnesium - TSH  2. Hyperlipidemia, mixed  - EKG 12-Lead - [place], RETROPERITNL ABD,  LTD - Lipid panel - TSH  3. Abnormal glucose  - EKG 12-Lead - [place], RETROPERITNL ABD,  LTD - Hemoglobin A1c - Insulin, random  4. Vitamin D deficiency  - VITAMIN D 25 Hydroxyl  5. ASHD/CABG x 3V (11/2013)  - EKG 12-Lead - Lipid panel  6. Paroxysmal atrial fibrillation (HCC)  - EKG 12-Lead - TSH  7. Tortuous aorta (HCC)  - EKG 12-Lead - [place], RETROPERITNL ABD,  LTD  8. Hypothyroidism, unspecified type   9. Polycythemia vera ([place])   10. OSA and COPD overlap syndrome ([place])   11. Idiopathic gout  - Uric acid  12. Cardiac pacemaker/AICD (01/2014)  - EKG 12-Lead  13. BPH with obstruction/lower urinary tract symptoms  - PSA  14. Screening for colorectal cancer  - POC Hemoccult Bld/Stl  15. Screening for ischemic heart disease  - EKG 12-Lead  16. Prostate cancer screening  - PSA  17. Screening for AAA   - [place], RETROPERITNL ABD,  LTD  18. FHx: heart disease  - EKG 12-Lead - [place], RETROPERITNL ABD,  LTD  19. Medication management  - Magnesium - Lipid panel - TSH - Hemoglobin A1c - Insulin, random - VITAMIN D 25 Hydroxy   20. Former smoker  - EKG 12-Lead - [place], RETROPERITNL ABD,           Patient was counseled in prudent diet, weight control to achieve/maintain BMI less than 25, BP monitoring, regular exercise and medications as discussed.  Discussed med effects and SE's. Routine screening labs and tests as requested with regular follow-up as recommended. Over 40 minutes of exam, counseling, chart review and high complex critical decision making was  performed   Kirtland Bouchard, MD

## 2020-05-09 ENCOUNTER — Ambulatory Visit (INDEPENDENT_AMBULATORY_CARE_PROVIDER_SITE_OTHER): Payer: Medicare Other | Admitting: Internal Medicine

## 2020-05-09 ENCOUNTER — Other Ambulatory Visit: Payer: Self-pay

## 2020-05-09 VITALS — BP 106/70 | HR 71 | Temp 97.0°F | Resp 18 | Ht 69.0 in | Wt 260.6 lb

## 2020-05-09 DIAGNOSIS — I771 Stricture of artery: Secondary | ICD-10-CM | POA: Diagnosis not present

## 2020-05-09 DIAGNOSIS — Z125 Encounter for screening for malignant neoplasm of prostate: Secondary | ICD-10-CM | POA: Diagnosis not present

## 2020-05-09 DIAGNOSIS — R7309 Other abnormal glucose: Secondary | ICD-10-CM | POA: Diagnosis not present

## 2020-05-09 DIAGNOSIS — N138 Other obstructive and reflux uropathy: Secondary | ICD-10-CM | POA: Diagnosis not present

## 2020-05-09 DIAGNOSIS — N401 Enlarged prostate with lower urinary tract symptoms: Secondary | ICD-10-CM

## 2020-05-09 DIAGNOSIS — E782 Mixed hyperlipidemia: Secondary | ICD-10-CM | POA: Diagnosis not present

## 2020-05-09 DIAGNOSIS — E559 Vitamin D deficiency, unspecified: Secondary | ICD-10-CM

## 2020-05-09 DIAGNOSIS — J449 Chronic obstructive pulmonary disease, unspecified: Secondary | ICD-10-CM

## 2020-05-09 DIAGNOSIS — G4733 Obstructive sleep apnea (adult) (pediatric): Secondary | ICD-10-CM

## 2020-05-09 DIAGNOSIS — M1000 Idiopathic gout, unspecified site: Secondary | ICD-10-CM

## 2020-05-09 DIAGNOSIS — Z87891 Personal history of nicotine dependence: Secondary | ICD-10-CM

## 2020-05-09 DIAGNOSIS — D45 Polycythemia vera: Secondary | ICD-10-CM

## 2020-05-09 DIAGNOSIS — I10 Essential (primary) hypertension: Secondary | ICD-10-CM

## 2020-05-09 DIAGNOSIS — Z79899 Other long term (current) drug therapy: Secondary | ICD-10-CM

## 2020-05-09 DIAGNOSIS — Z1211 Encounter for screening for malignant neoplasm of colon: Secondary | ICD-10-CM

## 2020-05-09 DIAGNOSIS — Z136 Encounter for screening for cardiovascular disorders: Secondary | ICD-10-CM

## 2020-05-09 DIAGNOSIS — I480 Paroxysmal atrial fibrillation: Secondary | ICD-10-CM | POA: Diagnosis not present

## 2020-05-09 DIAGNOSIS — Z8249 Family history of ischemic heart disease and other diseases of the circulatory system: Secondary | ICD-10-CM | POA: Diagnosis not present

## 2020-05-09 DIAGNOSIS — Z950 Presence of cardiac pacemaker: Secondary | ICD-10-CM | POA: Diagnosis not present

## 2020-05-09 DIAGNOSIS — I2510 Atherosclerotic heart disease of native coronary artery without angina pectoris: Secondary | ICD-10-CM | POA: Diagnosis not present

## 2020-05-09 DIAGNOSIS — E039 Hypothyroidism, unspecified: Secondary | ICD-10-CM

## 2020-05-09 DIAGNOSIS — Z1212 Encounter for screening for malignant neoplasm of rectum: Secondary | ICD-10-CM

## 2020-05-10 LAB — LIPID PANEL
Cholesterol: 134 mg/dL (ref <200)
HDL: 21 mg/dL — ABNORMAL LOW (ref >=40)
LDL Cholesterol (Calc): 89 mg/dL (calc)
Non-HDL Cholesterol (Calc): 113 mg/dL (calc) (ref <130)
Total CHOL/HDL Ratio: 6.4 (calc) — ABNORMAL HIGH (ref <5.0)
Triglycerides: 138 mg/dL (ref <150)

## 2020-05-10 LAB — URINALYSIS, ROUTINE W REFLEX MICROSCOPIC
Bilirubin Urine: NEGATIVE
Glucose, UA: NEGATIVE
Hgb urine dipstick: NEGATIVE
Ketones, ur: NEGATIVE
Leukocytes,Ua: NEGATIVE
Nitrite: NEGATIVE
Protein, ur: NEGATIVE
Specific Gravity, Urine: 1.008 (ref 1.001–1.03)
pH: 7 (ref 5.0–8.0)

## 2020-05-10 LAB — HEMOGLOBIN A1C
Hgb A1c MFr Bld: 5.8 % of total Hgb — ABNORMAL HIGH (ref <5.7)
Mean Plasma Glucose: 120 mg/dL
eAG (mmol/L): 6.6 mmol/L

## 2020-05-10 LAB — INSULIN, RANDOM: Insulin: 9.8 u[IU]/mL

## 2020-05-10 LAB — MAGNESIUM: Magnesium: 1.8 mg/dL (ref 1.5–2.5)

## 2020-05-10 LAB — MICROALBUMIN / CREATININE URINE RATIO
Creatinine, Urine: 42 mg/dL (ref 20–320)
Microalb Creat Ratio: 48 mcg/mg creat — ABNORMAL HIGH (ref <30)
Microalb, Ur: 2 mg/dL

## 2020-05-10 LAB — VITAMIN D 25 HYDROXY (VIT D DEFICIENCY, FRACTURES): Vit D, 25-Hydroxy: 64 ng/mL (ref 30–100)

## 2020-05-10 LAB — TSH: TSH: 1.05 mIU/L (ref 0.40–4.50)

## 2020-05-10 LAB — URIC ACID: Uric Acid, Serum: 5.5 mg/dL (ref 4.0–8.0)

## 2020-05-10 LAB — PSA: PSA: 0.1 ng/mL (ref <=4.0)

## 2020-05-12 NOTE — Progress Notes (Signed)
========================================================== ==========================================================  -    PSA - Low - Great  ==========================================================  -  Uric Acid / Gout Test - Normal - Please continue Allopurinol ==========================================================  -   Magnesium  -   1.8   -  very  low- goal is betw 2.0 - 2.5,   - So..............Marland Kitchen  Recommend that you INCREASE your  Magnesium 400 mg tablet up to 2 to 3 tablets /daily   - also important to eat lots of  leafy green vegetables   - spinach - Kale - collards - greens - okra - asparagus  - broccoli - quinoa - squash - almonds   - black, red, white beans  -  peas - green beans ==========================================================  -  Total Chol = 134 & LDL 89 - Both  Excellent   - Very low risk for Heart Attack  / Stroke ========================================================  - A1c = 5.8% - Blood sugar and A1c are STILL elevated in the  borderline and early or pre-diabetes range which has the same   300% increased risk for heart attack, stroke, cancer and   alzheimer- type vascular dementia as full blown diabetes.   But the good news is that diet, exercise with  weight loss can cure the early diabetes at this point. ==========================================================  -  Vitamin D = 64 - Excellent - Please keep dose same  ==========================================================  -  All Else - CBC - Kidneys - Electrolytes - Liver - Magnesium & Thyroid    - all  Normal / OK ===========================================================

## 2020-05-23 ENCOUNTER — Other Ambulatory Visit: Payer: Self-pay | Admitting: *Deleted

## 2020-05-23 DIAGNOSIS — Z1211 Encounter for screening for malignant neoplasm of colon: Secondary | ICD-10-CM

## 2020-05-29 ENCOUNTER — Other Ambulatory Visit: Payer: Self-pay | Admitting: Internal Medicine

## 2020-05-29 ENCOUNTER — Other Ambulatory Visit: Payer: Self-pay | Admitting: Hematology

## 2020-05-29 ENCOUNTER — Other Ambulatory Visit: Payer: Self-pay | Admitting: Adult Health

## 2020-05-29 DIAGNOSIS — I480 Paroxysmal atrial fibrillation: Secondary | ICD-10-CM

## 2020-05-29 MED ORDER — WARFARIN SODIUM 5 MG PO TABS: ORAL_TABLET | ORAL | 3 refills | Status: DC

## 2020-05-30 DIAGNOSIS — I255 Ischemic cardiomyopathy: Secondary | ICD-10-CM | POA: Diagnosis not present

## 2020-06-01 DIAGNOSIS — I350 Nonrheumatic aortic (valve) stenosis: Secondary | ICD-10-CM | POA: Diagnosis not present

## 2020-06-01 DIAGNOSIS — I2510 Atherosclerotic heart disease of native coronary artery without angina pectoris: Secondary | ICD-10-CM | POA: Diagnosis not present

## 2020-06-01 DIAGNOSIS — Z95810 Presence of automatic (implantable) cardiac defibrillator: Secondary | ICD-10-CM | POA: Diagnosis not present

## 2020-06-01 DIAGNOSIS — I480 Paroxysmal atrial fibrillation: Secondary | ICD-10-CM | POA: Diagnosis not present

## 2020-06-01 DIAGNOSIS — I25810 Atherosclerosis of coronary artery bypass graft(s) without angina pectoris: Secondary | ICD-10-CM | POA: Diagnosis not present

## 2020-06-02 ENCOUNTER — Other Ambulatory Visit: Payer: Self-pay | Admitting: Internal Medicine

## 2020-06-02 DIAGNOSIS — I480 Paroxysmal atrial fibrillation: Secondary | ICD-10-CM

## 2020-06-02 LAB — COLOGUARD

## 2020-06-02 MED ORDER — WARFARIN SODIUM 5 MG PO TABS: ORAL_TABLET | ORAL | 3 refills | Status: DC

## 2020-06-11 ENCOUNTER — Other Ambulatory Visit: Payer: Self-pay | Admitting: Internal Medicine

## 2020-06-11 DIAGNOSIS — I480 Paroxysmal atrial fibrillation: Secondary | ICD-10-CM

## 2020-06-11 DIAGNOSIS — Z1212 Encounter for screening for malignant neoplasm of rectum: Secondary | ICD-10-CM | POA: Diagnosis not present

## 2020-06-11 DIAGNOSIS — Z1211 Encounter for screening for malignant neoplasm of colon: Secondary | ICD-10-CM | POA: Diagnosis not present

## 2020-06-11 DIAGNOSIS — Z7901 Long term (current) use of anticoagulants: Secondary | ICD-10-CM | POA: Diagnosis not present

## 2020-06-11 DIAGNOSIS — Z95810 Presence of automatic (implantable) cardiac defibrillator: Secondary | ICD-10-CM | POA: Diagnosis not present

## 2020-06-11 DIAGNOSIS — I255 Ischemic cardiomyopathy: Secondary | ICD-10-CM | POA: Diagnosis not present

## 2020-06-11 MED ORDER — WARFARIN SODIUM 5 MG PO TABS: ORAL_TABLET | ORAL | 0 refills | Status: DC

## 2020-06-13 ENCOUNTER — Other Ambulatory Visit: Payer: Self-pay | Admitting: Internal Medicine

## 2020-06-13 MED ORDER — ALBUTEROL SULFATE (2.5 MG/3ML) 0.083% IN NEBU: INHALATION_SOLUTION | RESPIRATORY_TRACT | 0 refills | Status: DC

## 2020-06-19 ENCOUNTER — Telehealth: Payer: Self-pay | Admitting: *Deleted

## 2020-06-19 ENCOUNTER — Encounter: Payer: Self-pay | Admitting: *Deleted

## 2020-06-19 LAB — COLOGUARD
COLOGUARD: NEGATIVE
Cologuard: NEGATIVE

## 2020-06-19 NOTE — Telephone Encounter (Signed)
Left message to inform patient of negative Cologuard result.

## 2020-07-16 DIAGNOSIS — I480 Paroxysmal atrial fibrillation: Secondary | ICD-10-CM | POA: Diagnosis not present

## 2020-07-16 DIAGNOSIS — I255 Ischemic cardiomyopathy: Secondary | ICD-10-CM | POA: Diagnosis not present

## 2020-07-16 DIAGNOSIS — Z95810 Presence of automatic (implantable) cardiac defibrillator: Secondary | ICD-10-CM | POA: Diagnosis not present

## 2020-07-16 DIAGNOSIS — Z7901 Long term (current) use of anticoagulants: Secondary | ICD-10-CM | POA: Diagnosis not present

## 2020-07-22 NOTE — Progress Notes (Signed)
HEMATOLOGY/ONCOLOGY CLINIC NOTE  Date of Service: 07/23/2020  Patient Care Team: [name], MD as PCP - General (Internal Medicine) Georg Ruddle, Ashok Cordia, MD as Referring Physician (Cardiology) Shirlee More Ronalee Red, MD as Referring Physician (Cardiology) Brunetta Genera, MD as Consulting Physician (Hematology)  CHIEF COMPLAINTS/PURPOSE OF CONSULTATION:  F/u for JAK2 positive MPN  HISTORY OF PRESENTING ILLNESS:   [name] [name] is a wonderful 80 y.o. male who has been referred to [place] by Dr. [name]  for evaluation and management of Thrombocytosis. He is accompanied today by his wife. The pt reports that he is doing well overall.   The pt reports that he has never had any blood clots.  He has atrial fibrillation and has taken Coumadin since 2015. He had heart surgery in 2015, and had his ICD placed in 2016. The pt also takes 81g aspirin daily.   The pt notes that he has not been aware of his high platelets previously, but only as of his most recent labs. He denies any recent major bleeds, surgeries, and other trauma. He denies having a splenectomy at any time as well.   The pt notes that his right kidney gets sore from time to time when he doesn't stay well hydrated. He also notes that when he urinates at these times, his urine is very dark. The pt adds that he had kidney stones one time. The pt is also taking Lasix and notes that he urinates frequently.   The pt adds that his gums have been very sore, even to touch from his tongue.  The pt notes that he does not want to use a CPAP after a bad experience with a previous sleep study that did diagnose him with sleep apnea. He notes that he falls asleep several times a day as well, falling asleep easily when he sits down, and denying falling asleep while driving. He also notes some increased difficulty remembering.   Most recent lab results (12/09/17) of CBC is as follows: all values are WNL except for WBC at 11.9k, RDW at  18.4, PLT at 649k, ANC at 9.7k.  On review of systems, pt reports good energy levels, falling asleep several times a day, staying active, and denies recent surgery, recent trauma, bleeding, unexpected weight loss, pain along the spine, abdominal pains, and any other symptoms.   On Family Hx the pt reports prostate cancer.   INTERVAL HISTORY:   [name] [name] returns today for management and evaluation of his JAK2 positive MPN. The patient's last visit with [place] was on 04/23/2020. The pt reports that he is doing well overall.  The pt reports that he had a tooth removed one week ago and was good until the night following, when he experienced heavy bleeding. The pt bled all night. The pt notes that he did not hold his ASA and Coumadin. He received stitches the morning following. The pt notes that he experienced black stools for the next 4-5 days. These issues have since resolved and the pt does not have any other concerns or symptoms. The pt notes that he does not have much of an appetite anymore. He eats at breakfast and dinnertime due to need to take pills. He notes that he has been experiencing some recent memory loss or more difficulty remembering certain things. The pt will be visiting family in [place] and [place] next month. The pt also notes that he constantly has phlegm, not due to allergies. It has recently worsened and is  yellow in color. This has been occurring for years, but worsened more recently. The pt notes he currently uses a nasal strip and humidifier while sleeping.  The pt notes no issues tolerating the Hydroxyurea. He regularly follows up with his Cardiologist and has an ECHO and appt in the near future.  Lab results today 07/23/2020 of CBC w/diff and CMP is as follows: all values are WNL except for WBC of 12.2K, MCV of 103.0, Neutro Abs of 10.1K, Abs Immature Granulocytes of 0.09K, Sodium of 134, Glucose of 159.  On review of systems, pt reports recent black stools,  decreased appetite and denies stomach pain, depression, infection issues, and any other symptoms.  MEDICAL HISTORY:  Past Medical History:  Diagnosis Date  . A-fib ([place]) 01/2014  . AICD (automatic cardioverter/defibrillator) present    [employer]  . Arthritis   . ASHD (arteriosclerotic heart disease) 2015   s/p CABG  . Asthma   . CAD (coronary artery disease)   . Cataract    s/p left  . CHF (congestive heart failure) ([place])   . COPD (chronic obstructive pulmonary disease) ([place])    by CXR, pt unsure of this  . Diverticulosis   . GERD (gastroesophageal reflux disease)   . Gout 2014  . Hyperlipidemia   . Hypertension   . Hypothyroidism   . LBBB (left bundle branch block)   . Polycythemia   . Pre-diabetes   . Prediabetes 01/2014  . Presence of permanent cardiac pacemaker   . Sleep apnea    no CPAP  . Thyroid disease    hypothyroid    SURGICAL HISTORY: Past Surgical History:  Procedure Laterality Date  . CARDIAC DEFIBRILLATOR PLACEMENT  07/2014  . CATARACT EXTRACTION W/ INTRAOCULAR LENS IMPLANT Left   . CORONARY ARTERY BYPASS GRAFT  11/2013  . lumb laminectomy  [phone]   x 3  . LUMBAR FUSION  2013  . NASAL SEPTOPLASTY W/ TURBINOPLASTY Bilateral 01/30/2016   Procedure: NASAL SEPTOPLASTY WITH BILATERAL TURBINATE REDUCTION;  Surgeon: Rozetta Nunnery, MD;  Location: [facility];  Service: ENT;  Laterality: Bilateral;  . TONSILLECTOMY    . UVULECTOMY N/A 01/30/2016   Procedure: UVULECTOMY;  Surgeon: Rozetta Nunnery, MD;  Location: [facility] OR;  Service: ENT;  Laterality: N/A;    SOCIAL HISTORY: Social History   Socioeconomic History  . Marital status: Married    Spouse name: [name]   . Number of children: 2  . Years of education: 38  . Highest education level: Not on file  Occupational History  . Occupation: Retired  Tobacco Use  . Smoking status: Former Smoker    Packs/day: 2.00    Years: 25.00    Pack years: 50.00    Types: Cigarettes    Quit date:  05/20/1983    Years since quitting: 37.2  . Smokeless tobacco: Never Used  Vaping Use  . Vaping Use: Never used  Substance and Sexual Activity  . Alcohol use: Not Currently    Alcohol/week: 0.0 standard drinks  . Drug use: No  . Sexual activity: Not on file  Other Topics Concern  . Not on file  Social History Narrative   Lives with wife, [name]   Caffeine use: daily (Coffee)   Social Determinants of Health   Financial Resource Strain: Not on file  Food Insecurity: Not on file  Transportation Needs: Not on file  Physical Activity: Not on file  Stress: Not on file  Social Connections: Not on file  Intimate Partner Violence:  Not on file    FAMILY HISTORY: Family History  Problem Relation Age of Onset  . Alzheimer's disease Mother   . Mental illness Mother   . Depression Mother   . Heart disease Father 43       had 5 MI's & died 58 yo  . Hypertension Father   . Alcohol abuse Son   . Stroke Maternal Grandmother   . Dementia Maternal Grandmother   . Heart attack Son     ALLERGIES:  is allergic to other.  MEDICATIONS:  Current Outpatient Medications  Medication Sig Dispense Refill  . acetaminophen (TYLENOL) 500 MG tablet Take 500 mg by mouth as needed for mild pain.     Marland Kitchen albuterol (PROVENTIL) (2.5 MG/3ML) 0.083% nebulizer solution Use   1 vial   4 x /day   or every 4 hours for Wheezing or Shortness of Breath 90 mL 0  . allopurinol (ZYLOPRIM) 300 MG tablet Take      1 tablet      Daily       to Prevent Gout 90 tablet 3  . AMBULATORY NON FORMULARY MEDICATION Nitroglycerine ointment 0.125 % - Apply a pea sized amount to your rectum to the first knuckle three times daily. 30 g 3  . aspirin EC 81 MG tablet Take 81 mg by mouth daily.    . Cholecalciferol (VITAMIN D PO) Take 10,000 Units by mouth daily.     . diphenhydrAMINE (BENADRYL) 25 MG tablet Take 25 mg by mouth at bedtime.    . Docosahexaenoic Acid (DHA PO) Take 500 mg by mouth daily. + EPA 250 mg    . docusate sodium  (COLACE) 100 MG capsule Take 100 mg by mouth 2 (two) times daily. 1 tablet in the morning and 1 tablet at bedtime    . doxazosin (CARDURA) 4 MG tablet TAKE 1 TABLET EVERY DAY 90 tablet 3  . finasteride (PROSCAR) 5 MG tablet TAKE 1 TABLET EVERY DAY FOR PROSTATE 90 tablet 3  . furosemide (LASIX) 40 MG tablet Take 1 tablet (40 mg total) by mouth daily. (Patient taking differently: Take 40 mg by mouth 2 (two) times daily.) 90 tablet 3  . hydroxyurea (HYDREA) 500 MG capsule TAKE 2 CAPSULES ONE TIME DAILY WITH FOOD 180 capsule 0  . levothyroxine (SYNTHROID) 200 MCG tablet TAKE 1 TAB DAILY ON EMPTY STOMACH WITH ONLY WATER FOR 30 MINS. NO ANTACIDS, CALCIUM OR MAGNESIUM FOR 4 HOURS. AVOID BIOTIN. 90 tablet 3  . losartan (COZAAR) 100 MG tablet Take 100 mg by mouth daily.    . magnesium oxide (MAG-OX) 400 MG tablet Take by mouth.    . MELATONIN PO Take 1 tablet by mouth as needed.    . metoprolol (TOPROL-XL) 200 MG 24 hr tablet Take 200 mg by mouth daily.    . Multiple Vitamin (MULTIVITAMIN) tablet Take 1 tablet by mouth daily. A-Z    . Omega-3 Fatty Acids (FISH OIL) 1000 MG CAPS Take by mouth. (Patient not taking: Reported on 05/09/2020)    . omeprazole (PRILOSEC) 20 MG capsule TAKE 1 CAPSULE TWICE DAILY  ( EVERY 12 HOURS  ) TO PREVENT HEARTBURN AND ACID REFLUX 180 capsule 3  . OVER THE COUNTER MEDICATION OTC Biotin daily.    . polyethylene glycol powder (GLYCOLAX/MIRALAX) 17 GM/SCOOP powder Take 17 g by mouth daily.    . Probiotic Product (PROBIOTIC-10 PO) Take by mouth daily.    . rosuvastatin (CRESTOR) 40 MG tablet TAKE 1 TABLET EVERY DAY FOR  CHOLESTEROL 90 tablet 3  . spironolactone (ALDACTONE) 25 MG tablet TAKE 1 TABLET EVERY DAY FOR BLOOD PRESSURE AND HEART FAILURE 90 tablet 3  . TURMERIC PO Take 1,000 mg by mouth daily.    Marland Kitchen warfarin (COUMADIN) 5 MG tablet Take 1 to 2 tablets Daily as Directed 145 tablet 0  . zinc gluconate 50 MG tablet Take 50 mg by mouth daily.     No current  facility-administered medications for this visit.    REVIEW OF SYSTEMS:   10 Point review of Systems was done is negative except as noted above.  PHYSICAL EXAMINATION: Vitals:   07/23/20 1420  BP: 108/60  Pulse: 70  Resp: 18  Temp: (!) 96.9 F (36.1 C)  SpO2: 98%   Filed Weights   07/23/20 1420  Weight: 255 lb 4.8 oz (115.8 kg)   .Body mass index is 37.7 kg/m.   Exam was given in a chair.  GENERAL:alert, in no acute distress and comfortable SKIN: no acute rashes, no significant lesions EYES: conjunctiva are pink and non-injected, sclera anicteric OROPHARYNX: MMM, no exudates, no oropharyngeal erythema or ulceration NECK: supple, no JVD LYMPH:  no palpable lymphadenopathy in the cervical, axillary or inguinal regions LUNGS: clear to auscultation b/l with normal respiratory effort HEART: regular rate & rhythm ABDOMEN:  normoactive bowel sounds , non tender, not distended. Extremity: no pedal edema PSYCH: alert & oriented x 3 with fluent speech NEURO: no focal motor/sensory deficits  LABORATORY DATA:  I have reviewed the data as listed  . CBC Latest Ref Rng & Units 07/23/2020 04/23/2020 01/26/2020  WBC 4.0 - 10.5 K/uL 12.2(H) 14.5(H) 13.1(H)  Hemoglobin 13.0 - 17.0 g/dL 15.8 15.7 15.8  Hematocrit 39.0 - 52.0 % 48.3 48.5 50.1  Platelets 150 - 400 K/uL 356 308 276    . CMP Latest Ref Rng & Units 07/23/2020 04/23/2020 01/26/2020  Glucose 70 - 99 mg/dL 159(H) 141(H) 143(H)  BUN 8 - 23 mg/dL 17 18 21   Creatinine 0.61 - 1.24 mg/dL 1.13 1.20 1.25(H)  Sodium 135 - 145 mmol/L 134(L) 137 139  Potassium 3.5 - 5.1 mmol/L 4.3 4.3 4.1  Chloride 98 - 111 mmol/L 101 101 100  CO2 22 - 32 mmol/L 28 30 30   Calcium 8.9 - 10.3 mg/dL 9.2 9.5 9.6  Total Protein 6.5 - 8.1 g/dL 6.7 6.8 6.6  Total Bilirubin 0.3 - 1.2 mg/dL 1.2 1.4(H) 1.5(H)  Alkaline Phos 38 - 126 U/L 61 64 60  AST 15 - 41 U/L 23 25 31   ALT 0 - 44 U/L 21 23 30     02/09/18 JAK2 Mutation Study:    02/09/18 BCR ABL  FISH:    RADIOGRAPHIC STUDIES: I have personally reviewed the radiological images as listed and agreed with the findings in the report. No results found.  ASSESSMENT & PLAN:   80 y.o. male with  1. 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 Some associated leukocytosis. No history of splenectomy. No previous h/o VTE, but significant cardiac risk factors. 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   02/09/18 BCR ABL FISH revealed no evidence of BCR/ABL rearrangement   PLAN: -Discussed pt labwork today, 07/23/2020; Plt normal, Hct tends to be overestimated due to diuretics, Chemistries stable. -Advised pt that the  borderline elevated WBC is most likely due to the recent surgery. -No need for phlebotomy at this time. HCT overestimated due to diuretic. The blood loss from surgery most likely aided in need for no phlebotomy.  -Recommended pt use steam inhaler to clear secretions in the morning. -Advised pt to discuss recent memory problems w PCP. Discussed further evaluation or medications to improve this. -Recommended pt increase physical activity and walk 20-30 minutes daily. This should help increase appetite as well. -Goal for PLT between 200K and 400K - PLT currently at goal. -Recommended pt consult for memory loss w PCP (Dr. Melford Aase) -The pt has no prohibitive toxicities from continuing 1000 mg Hydroxyurea 7 days/week at this time. -Continue daily baby Aspirin  -Will see back in in 3 months with labs.  2. . Patient Active Problem List   Diagnosis Date Noted  . Anal fissure 11/10/2019  . Rectal pain 11/10/2019  . Constipation 11/10/2019  . Rectal bleeding 11/10/2019  . Abnormal glucose 10/12/2019  . Memory  changes 01/10/2019  . Polycythemia vera ([place]) 10/21/2018  . Thrombocytosis 12/29/2017  . Senile purpura ([place]) 09/01/2017  . Emphysema of lung ([place]) 06/01/2017  . Tortuous aorta ([facility]) 06/01/2017  . ASHD/CABG x 3V (11/2013) 02/06/2015  . Acquired thrombophilia ([place]) 02/06/2015  . Cardiac pacemaker/AICD (01/2014) 02/06/2015  . Morbid obesity ([place]) 01/03/2015  . HTN (hypertension) 01/02/2015  . Hyperlipidemia, mixed 01/02/2015  . Vitamin D deficiency 01/02/2015  . GERD  01/02/2015  . BPH (benign prostatic hyperplasia) 01/02/2015  . Medication management 01/02/2015  . Atrial fibrillation ([place]) 01/02/2015  . Hypothyroidism 01/02/2015  . OSA and COPD overlap syndrome ([place]) 01/02/2015  -continue f/u with PCP to optimize atherosclerotic risk factors.   FOLLOW UP: RTC with Dr Irene Limbo with labs in 3 months   The total time spent in the appointment was 20 minutes and more than 50% was on counseling and direct patient cares.   All of the patient's questions were answered with apparent satisfaction. The patient knows to call the clinic with any problems, questions or concerns.   Sullivan Lone MD [place] AAHIVMS [facility] Hematology/Oncology Physician [facility]  (Office):       [phone] (Work cell):  [phone] (Fax):           [phone]  07/23/2020 2:48 PM  I, [name], am acting as scribe for Dr. Sullivan Lone, MD.    .I have reviewed the above documentation for accuracy and completeness, and I agree with the above. Brunetta Genera MD

## 2020-07-23 ENCOUNTER — Inpatient Hospital Stay (HOSPITAL_BASED_OUTPATIENT_CLINIC_OR_DEPARTMENT_OTHER): Payer: Medicare Other | Admitting: Hematology

## 2020-07-23 ENCOUNTER — Other Ambulatory Visit: Payer: Self-pay

## 2020-07-23 ENCOUNTER — Telehealth: Payer: Self-pay | Admitting: Hematology

## 2020-07-23 ENCOUNTER — Inpatient Hospital Stay: Payer: Medicare Other | Attending: Hematology

## 2020-07-23 VITALS — BP 108/60 | HR 70 | Temp 96.9°F | Resp 18 | Ht 69.0 in | Wt 255.3 lb

## 2020-07-23 DIAGNOSIS — Z1589 Genetic susceptibility to other disease: Secondary | ICD-10-CM | POA: Diagnosis not present

## 2020-07-23 DIAGNOSIS — I4891 Unspecified atrial fibrillation: Secondary | ICD-10-CM | POA: Diagnosis not present

## 2020-07-23 DIAGNOSIS — D471 Chronic myeloproliferative disease: Secondary | ICD-10-CM

## 2020-07-23 DIAGNOSIS — D75839 Thrombocytosis, unspecified: Secondary | ICD-10-CM | POA: Diagnosis not present

## 2020-07-23 DIAGNOSIS — D45 Polycythemia vera: Secondary | ICD-10-CM

## 2020-07-23 LAB — CBC WITH DIFFERENTIAL/PLATELET
Abs Immature Granulocytes: 0.09 10*3/uL — ABNORMAL HIGH (ref 0.00–0.07)
Basophils Absolute: 0.1 10*3/uL (ref 0.0–0.1)
Basophils Relative: 1 %
Eosinophils Absolute: 0.1 10*3/uL (ref 0.0–0.5)
Eosinophils Relative: 1 %
HCT: 48.3 % (ref 39.0–52.0)
Hemoglobin: 15.8 g/dL (ref 13.0–17.0)
Immature Granulocytes: 1 %
Lymphocytes Relative: 11 %
Lymphs Abs: 1.4 10*3/uL (ref 0.7–4.0)
MCH: 33.7 pg (ref 26.0–34.0)
MCHC: 32.7 g/dL (ref 30.0–36.0)
MCV: 103 fL — ABNORMAL HIGH (ref 80.0–100.0)
Monocytes Absolute: 0.4 10*3/uL (ref 0.1–1.0)
Monocytes Relative: 3 %
Neutro Abs: 10.1 10*3/uL — ABNORMAL HIGH (ref 1.7–7.7)
Neutrophils Relative %: 83 %
Platelets: 356 10*3/uL (ref 150–400)
RBC: 4.69 MIL/uL (ref 4.22–5.81)
RDW: 15.2 % (ref 11.5–15.5)
WBC: 12.2 10*3/uL — ABNORMAL HIGH (ref 4.0–10.5)
nRBC: 0 % (ref 0.0–0.2)

## 2020-07-23 LAB — CMP (CANCER CENTER ONLY)
ALT: 21 U/L (ref 0–44)
AST: 23 U/L (ref 15–41)
Albumin: 4.1 g/dL (ref 3.5–5.0)
Alkaline Phosphatase: 61 U/L (ref 38–126)
Anion gap: 5 (ref 5–15)
BUN: 17 mg/dL (ref 8–23)
CO2: 28 mmol/L (ref 22–32)
Calcium: 9.2 mg/dL (ref 8.9–10.3)
Chloride: 101 mmol/L (ref 98–111)
Creatinine: 1.13 mg/dL (ref 0.61–1.24)
GFR, Estimated: >60 mL/min (ref >60)
Glucose, Bld: 159 mg/dL — ABNORMAL HIGH (ref 70–99)
Potassium: 4.3 mmol/L (ref 3.5–5.1)
Sodium: 134 mmol/L — ABNORMAL LOW (ref 135–145)
Total Bilirubin: 1.2 mg/dL (ref 0.3–1.2)
Total Protein: 6.7 g/dL (ref 6.5–8.1)

## 2020-07-23 NOTE — Telephone Encounter (Signed)
Scheduled appointments per 3/7 los. Spoke to patient who is aware of appointments date and times. Gave patient calendar print out.

## 2020-08-20 DIAGNOSIS — Z95810 Presence of automatic (implantable) cardiac defibrillator: Secondary | ICD-10-CM | POA: Diagnosis not present

## 2020-08-20 DIAGNOSIS — I255 Ischemic cardiomyopathy: Secondary | ICD-10-CM | POA: Diagnosis not present

## 2020-08-20 DIAGNOSIS — I480 Paroxysmal atrial fibrillation: Secondary | ICD-10-CM | POA: Diagnosis not present

## 2020-08-20 DIAGNOSIS — Z7901 Long term (current) use of anticoagulants: Secondary | ICD-10-CM | POA: Diagnosis not present

## 2020-08-22 ENCOUNTER — Other Ambulatory Visit: Payer: Self-pay | Admitting: Hematology

## 2020-08-27 ENCOUNTER — Encounter: Payer: Self-pay | Admitting: Adult Health

## 2020-08-27 DIAGNOSIS — Z1589 Genetic susceptibility to other disease: Secondary | ICD-10-CM | POA: Insufficient documentation

## 2020-08-27 DIAGNOSIS — D471 Chronic myeloproliferative disease: Secondary | ICD-10-CM | POA: Insufficient documentation

## 2020-08-27 NOTE — Progress Notes (Signed)
FOLLOW UP  Assessment and Plan:   Essential hypertension At goal; continue medications Monitor blood pressure at home; call if consistently over 130/80 Continue DASH diet.   Reminder to go to the ER if any CP, SOB, nausea, dizziness, severe HA, changes vision/speech, left arm numbness and tingling and jaw pain.  Paroxysmal atrial fibrillation (HCC)/ Acquired thrombophilia ([name]) Continue follow up with a fib clinic for coumadin management; rate controlled today Discussed if patient falls to immediately contact office or go to ER. Discussed foods that can increase or decrease Coumadin levels. Patient understands to call the office before starting a new medication.  ASHD/CABG x 3V (11/2013) Control blood pressure, cholesterol (LDL goal <70), glucose, increase exercise.   Hypothyroidism, unspecified type continue medications the same reminded to take on an empty stomach 30-3mins before food.  -     TSH  Mixed hyperlipidemia Persistently above LDL goal of <70 on atorvastatin 40 mg daily; discussed and will switch to rosuvastatin 40 mg daily  Continue low cholesterol diet and exercise.  -     Lipid panel  Prediabetes Discussed disease and risks Discussed diet/exercise, weight management  A1C q26m; monitor weight and serum glucose  Medication management -     CBC with Differential/Platelet -     CMP/GFR  Morbid obesity ([place]) Long discussion about weight loss, diet, and exercise Recommended diet heavy in fruits and veggies and low in animal meats, cheeses, and dairy products, appropriate calorie intake Discussed appropriate weight for height  Follow up in 3 months  Vitamin D deficiency At goal; continue supplementation Defer vitamin D level  Memory changes Labs normal; stable sx; declined imaging or neuro referral today Discussed untreated OSA may significantly contribute; would like to pursue. Given information for dental oral device, [name] [name] contact information  for evaluation. However if no improvement with oral device or can't tolerate will refer to neuro.   Continue diet and meds as discussed. Further disposition pending results of labs. Discussed med's effects and SE's.   Over 30 minutes of exam, counseling, chart review, and critical decision making was performed.   Future Appointments  Date Time Provider [name] [name]  10/23/2020  2:00 PM [name] [name] LAB [facility] None  10/23/2020  2:40 PM [name] Genera, MD [name] [name] None  11/29/2020  2:30 PM [name] Pinto, MD [name] [name] None  05/28/2021 10:00 AM [name] Pinto, MD [name] [name] None    ----------------------------------------------------------------------------------------------------------------------  HPI 80 y.o. male  presents for 3 month follow up on hypertension, cholesterol, prediabetes, obesity and vitamin D deficiency.   He has reported some short term memory changes over the last few years, progressive difficulty with word recall; has been declining imaging/neuro referral, did have unremarkable labs including HIV, RPR, TSH, B12 in 12/2018. Does have family history of dementia later in life.   Notably does have OSA/COPD overlap, has CPAP but could not tolerate several masks, last sleep study remote. He has been sleeping in recliner. He has not tried oral device, interested in pursuing this prior to neuro referral.   He is following with Dr. Irene [name] for myeloproliferative neoplasm with JAK2 mutation, on hydroxyurea and undergoing therapeutic phlebotomies.  BMI is Body mass index is 37.36 kg/m., he has been working on diet, admits not exercising consistently, intermittently will walk on treadmill and lifting weights. Wt Readings from Last 3 Encounters:  08/28/20 253 lb (114.8 kg)  07/23/20 255 lb 4.8 oz (115.8 kg)  05/09/20 260 lb 9.6 oz (118.2 kg)   In 2015, he underwent a  CABG x 3 V w/po Afib- and failed 2 attempts at VCV. Then in 07/2014, he had a [employer], AICD/Defib/PM  planted.  He is followed at his Cardiologist's ([name] [name]) coumadin clinic via [place]. He has tortuous aorta per CXR 05/2016.  His blood pressure has been controlled at home, today their BP is BP: 110/62  He does workout. He denies chest pain, shortness of breath, dizziness.   He is on cholesterol medication (atorvastatin 40 mg daily) and denies myalgias. His cholesterol is at goal (cardiology has not recommended change). The cholesterol last visit was:   Lab Results  Component Value Date   CHOL 134 05/09/2020   HDL 21 (L) 05/09/2020   LDLCALC 89 05/09/2020   TRIG 138 05/09/2020   CHOLHDL 6.4 (H) 05/09/2020    He has been working on diet and exercise for prediabetes, and denies foot ulcerations, increased appetite, nausea, paresthesia of the feet, polydipsia, polyuria, visual disturbances and vomiting. Last A1C in the office was:  Lab Results  Component Value Date   HGBA1C 5.8 (H) 05/09/2020   He is on thyroid medication. His medication was not changed last visit. Takes 1 tab daily except 1.5 tab on Sunday Lab Results  Component Value Date   TSH 1.05 05/09/2020   [name]   Lab Results  Component Value Date   VD25OH 64 05/09/2020        Current Medications:  Current Outpatient Medications on File Prior to Visit  Medication Sig  . acetaminophen (TYLENOL) 500 MG tablet Take 500 mg by mouth as needed for mild pain.  Marland Kitchen allopurinol (ZYLOPRIM) 300 MG tablet Take      1 tablet      Daily       to Prevent Gout  . AMBULATORY NON FORMULARY MEDICATION Nitroglycerine ointment 0.125 % - Apply a pea sized amount to your rectum to the first knuckle three times daily.  Marland Kitchen aspirin EC 81 MG tablet Take 81 mg by mouth daily.  . Cholecalciferol (VITAMIN D PO) Take 10,000 Units by mouth daily.  . diphenhydrAMINE (BENADRYL) 25 MG tablet Take 25 mg by mouth at bedtime.  . Docosahexaenoic Acid (DHA PO) Take 500 mg by mouth daily. + EPA 250 mg  . docusate sodium  (COLACE) 100 MG capsule Take 100 mg by mouth 2 (two) times daily. 1 tablet in the morning and 1 tablet at bedtime  . doxazosin (CARDURA) 4 MG tablet TAKE 1 TABLET EVERY DAY  . finasteride (PROSCAR) 5 MG tablet TAKE 1 TABLET EVERY DAY FOR PROSTATE  . furosemide (LASIX) 40 MG tablet Take 1 tablet (40 mg total) by mouth daily. (Patient taking differently: Take 40 mg by mouth 2 (two) times daily.)  . hydroxyurea (HYDREA) 500 MG capsule TAKE 2 CAPSULES ONE TIME DAILY WITH FOOD  . isosorbide mononitrate (IMDUR) 30 MG 24 hr tablet Take by mouth.  . levothyroxine (SYNTHROID) 200 MCG tablet TAKE 1 TAB DAILY ON EMPTY STOMACH WITH ONLY WATER FOR 30 MINS. NO ANTACIDS, CALCIUM OR MAGNESIUM FOR 4 HOURS. AVOID BIOTIN.  Marland Kitchen losartan (COZAAR) 100 MG tablet Take 100 mg by mouth daily.  . magnesium oxide (MAG-OX) 400 MG tablet Take by mouth.  . MELATONIN PO Take 1 tablet by mouth as needed.  . metoprolol (TOPROL-XL) 200 MG 24 hr tablet Take 200 mg by mouth daily.  . Multiple Vitamin (MULTIVITAMIN) tablet Take 1 tablet by mouth daily. A-Z  . Omega-3 Fatty Acids (FISH OIL) 1000 MG CAPS Take  by mouth.  Marland Kitchen omeprazole (PRILOSEC) 20 MG capsule TAKE 1 CAPSULE TWICE DAILY  ( EVERY 12 HOURS  ) TO PREVENT HEARTBURN AND ACID REFLUX  . OVER THE COUNTER MEDICATION OTC Biotin daily.  . polyethylene glycol powder (GLYCOLAX/MIRALAX) 17 GM/SCOOP powder Take 17 g by mouth daily.  . Probiotic Product (PROBIOTIC-10 PO) Take by mouth daily.  . rosuvastatin (CRESTOR) 40 MG tablet TAKE 1 TABLET EVERY DAY FOR CHOLESTEROL  . spironolactone (ALDACTONE) 25 MG tablet TAKE 1 TABLET EVERY DAY FOR BLOOD PRESSURE AND HEART FAILURE  . TURMERIC PO Take 1,000 mg by mouth daily.  Marland Kitchen warfarin (COUMADIN) 5 MG tablet Take 1 to 2 tablets Daily as Directed  . zinc gluconate 50 MG tablet Take 50 mg by mouth daily.  Marland Kitchen albuterol (PROVENTIL) (2.5 MG/3ML) 0.083% nebulizer solution Use   1 vial   4 x /day   or every 4 hours for Wheezing or Shortness of Breath  (Patient not taking: No sig reported)   No current facility-administered medications on file prior to visit.     Allergies:  Allergies  Allergen Reactions  . Other Swelling    SODIUM SENSITIVE     Medical History:  Past Medical History:  Diagnosis Date  . A-fib ([place]) 01/2014  . AICD (automatic cardioverter/defibrillator) present    [employer]  . Anal fissure 11/10/2019  . Arthritis   . ASHD (arteriosclerotic heart disease) 2015   s/p CABG  . Asthma   . CAD (coronary artery disease)   . Cataract    s/p left  . CHF (congestive heart failure) ([place])   . COPD (chronic obstructive pulmonary disease) ([name])    by CXR, pt unsure of this  . Diverticulosis   . GERD (gastroesophageal reflux disease)   . Gout 2014  . Hyperlipidemia   . Hypertension   . Hypothyroidism   . LBBB (left bundle branch block)   . Polycythemia   . Pre-diabetes   . Prediabetes 01/2014  . Presence of permanent cardiac pacemaker   . Sleep apnea    no CPAP  . Thyroid disease    hypothyroid   Family history- Reviewed and unchanged Social history- Reviewed and unchanged   Review of Systems:  Review of Systems  Constitutional: Negative for malaise/fatigue and weight loss.  HENT: Negative for hearing loss and tinnitus.   Eyes: Negative for blurred vision and double vision.  Respiratory: Negative for cough, shortness of breath and wheezing.   Cardiovascular: Negative for chest pain, palpitations, orthopnea, claudication and leg swelling.  Gastrointestinal: Negative for abdominal pain, blood in stool, constipation, diarrhea, heartburn, melena, nausea and vomiting.  Genitourinary: Negative.   Musculoskeletal: Negative for falls, joint pain and myalgias.  Skin: Negative for rash.  Neurological: Negative for dizziness, tingling, sensory change, weakness and headaches.  Endo/Heme/Allergies: Negative for polydipsia.  Psychiatric/Behavioral: Positive for memory loss (stable; some short term recall, word  finding ). Negative for depression, hallucinations and substance abuse. The patient is not nervous/anxious and does not have insomnia.   All other systems reviewed and are negative.    Physical Exam: BP 110/62   Pulse 70   Temp (!) 96.3 F (35.7 C)   Wt 253 lb (114.8 kg)   SpO2 95%   BMI 37.36 kg/m  Wt Readings from Last 3 Encounters:  08/28/20 253 lb (114.8 kg)  07/23/20 255 lb 4.8 oz (115.8 kg)  05/09/20 260 lb 9.6 oz (118.2 kg)   General Appearance: Well nourished, in no apparent distress. Eyes:  PERRLA, EOMs, conjunctiva no swelling or erythema Sinuses: No Frontal/maxillary tenderness ENT/Mouth: Ext aud canals clear, TMs without erythema, bulging. No erythema, swelling, or exudate on post pharynx.  Tonsils not swollen or erythematous. Hearing normal.  Neck: Supple, thyroid normal.  Respiratory: Respiratory effort normal, BS equal bilaterally without rales, rhonchi, wheezing or stridor.  Cardio: RRR with no MRGs. Brisk peripheral pulses without edema.  Abdomen: Soft, obese, + BS.  Non tender, no guarding, rebound, hernias, masses. Lymphatics: Non tender without lymphadenopathy.  Musculoskeletal: Full ROM, 5/5 strength, Normal gait Skin: Warm, dry without rashes, lesions, ecchymosis.  Neuro: Cranial nerves intact. No cerebellar symptoms. 2/3 word recall, some cognitive slowing, mild word recall difficulty throughout our visit Psych: Awake and oriented X 3, normal affect, Insight and Judgment appropriate.    [name] Ribas, NP 3:12 PM [facility]

## 2020-08-28 ENCOUNTER — Encounter: Payer: Self-pay | Admitting: Adult Health

## 2020-08-28 ENCOUNTER — Other Ambulatory Visit: Payer: Self-pay

## 2020-08-28 ENCOUNTER — Ambulatory Visit (INDEPENDENT_AMBULATORY_CARE_PROVIDER_SITE_OTHER): Payer: Medicare Other | Admitting: Adult Health

## 2020-08-28 VITALS — BP 110/62 | HR 70 | Temp 96.3°F | Wt 253.0 lb

## 2020-08-28 DIAGNOSIS — I480 Paroxysmal atrial fibrillation: Secondary | ICD-10-CM

## 2020-08-28 DIAGNOSIS — I2510 Atherosclerotic heart disease of native coronary artery without angina pectoris: Secondary | ICD-10-CM | POA: Diagnosis not present

## 2020-08-28 DIAGNOSIS — J449 Chronic obstructive pulmonary disease, unspecified: Secondary | ICD-10-CM

## 2020-08-28 DIAGNOSIS — D6869 Other thrombophilia: Secondary | ICD-10-CM | POA: Diagnosis not present

## 2020-08-28 DIAGNOSIS — D692 Other nonthrombocytopenic purpura: Secondary | ICD-10-CM | POA: Diagnosis not present

## 2020-08-28 DIAGNOSIS — D471 Chronic myeloproliferative disease: Secondary | ICD-10-CM | POA: Diagnosis not present

## 2020-08-28 DIAGNOSIS — I10 Essential (primary) hypertension: Secondary | ICD-10-CM | POA: Diagnosis not present

## 2020-08-28 DIAGNOSIS — R7309 Other abnormal glucose: Secondary | ICD-10-CM | POA: Diagnosis not present

## 2020-08-28 DIAGNOSIS — G4733 Obstructive sleep apnea (adult) (pediatric): Secondary | ICD-10-CM

## 2020-08-28 DIAGNOSIS — Z79899 Other long term (current) drug therapy: Secondary | ICD-10-CM | POA: Diagnosis not present

## 2020-08-28 DIAGNOSIS — Z1589 Genetic susceptibility to other disease: Secondary | ICD-10-CM | POA: Diagnosis not present

## 2020-08-28 DIAGNOSIS — I771 Stricture of artery: Secondary | ICD-10-CM

## 2020-08-28 DIAGNOSIS — E782 Mixed hyperlipidemia: Secondary | ICD-10-CM

## 2020-08-28 NOTE — Patient Instructions (Addendum)
   Please contact Dr. Augustina Mood for evaluation of a dental device for sleep apnea   For the secretions in throat  Please clarify what the night time medication "allergy" is - check active ingredients on the back and let me know  Try getting on daily antihistamine - certirizine, fexofenadine, etc -  For 2 weeks   If this doesn't help your secretions, try adding mucinex (guaifenacin) at night   Recommend scheduling appointment with dermatologist for full body skin check -  [employer] Free Hearing Test with no obligation # [phone] Do not have to be a member Tues-Sat 10-6  [place]- free test with no obligation # [phone] MUST BE A MEMBER Call for store hours  Have had patient's get good cheaper hearing aids from [facility] The air version has good reviews.

## 2020-08-29 ENCOUNTER — Other Ambulatory Visit: Payer: Self-pay

## 2020-08-29 ENCOUNTER — Other Ambulatory Visit: Payer: Self-pay | Admitting: Adult Health

## 2020-08-29 DIAGNOSIS — I255 Ischemic cardiomyopathy: Secondary | ICD-10-CM | POA: Diagnosis not present

## 2020-08-29 DIAGNOSIS — E039 Hypothyroidism, unspecified: Secondary | ICD-10-CM

## 2020-08-29 DIAGNOSIS — N289 Disorder of kidney and ureter, unspecified: Secondary | ICD-10-CM

## 2020-08-29 LAB — COMPLETE METABOLIC PANEL WITH GFR
AG Ratio: 1.9 (calc) (ref 1.0–2.5)
ALT: 21 U/L (ref 9–46)
AST: 28 U/L (ref 10–35)
Albumin: 4.4 g/dL (ref 3.6–5.1)
Alkaline phosphatase (APISO): 59 U/L (ref 35–144)
BUN/Creatinine Ratio: 20 (calc) (ref 6–22)
BUN: 28 mg/dL — ABNORMAL HIGH (ref 7–25)
CO2: 26 mmol/L (ref 20–32)
Calcium: 9.9 mg/dL (ref 8.6–10.3)
Chloride: 98 mmol/L (ref 98–110)
Creat: 1.37 mg/dL — ABNORMAL HIGH (ref 0.70–1.18)
GFR, Est African American: 56 mL/min/{1.73_m2} — ABNORMAL LOW (ref >=60)
GFR, Est Non African American: 49 mL/min/{1.73_m2} — ABNORMAL LOW (ref >=60)
Globulin: 2.3 g/dL (calc) (ref 1.9–3.7)
Glucose, Bld: 107 mg/dL — ABNORMAL HIGH (ref 65–99)
Potassium: 5.4 mmol/L — ABNORMAL HIGH (ref 3.5–5.3)
Sodium: 135 mmol/L (ref 135–146)
Total Bilirubin: 1.1 mg/dL (ref 0.2–1.2)
Total Protein: 6.7 g/dL (ref 6.1–8.1)

## 2020-08-29 LAB — CBC WITH DIFFERENTIAL/PLATELET
Absolute Monocytes: 509 cells/uL (ref 200–950)
Basophils Absolute: 121 cells/uL (ref 0–200)
Basophils Relative: 0.9 %
Eosinophils Absolute: 80 cells/uL (ref 15–500)
Eosinophils Relative: 0.6 %
HCT: 47.6 % (ref 38.5–50.0)
Hemoglobin: 16.2 g/dL (ref 13.2–17.1)
Lymphs Abs: 1688 cells/uL (ref 850–3900)
MCH: 34.1 pg — ABNORMAL HIGH (ref 27.0–33.0)
MCHC: 34 g/dL (ref 32.0–36.0)
MCV: 100.2 fL — ABNORMAL HIGH (ref 80.0–100.0)
MPV: 10.8 fL (ref 7.5–12.5)
Monocytes Relative: 3.8 %
Neutro Abs: 11001 cells/uL — ABNORMAL HIGH (ref 1500–7800)
Neutrophils Relative %: 82.1 %
Platelets: 367 10*3/uL (ref 140–400)
RBC: 4.75 10*6/uL (ref 4.20–5.80)
RDW: 14.3 % (ref 11.0–15.0)
Total Lymphocyte: 12.6 %
WBC: 13.4 10*3/uL — ABNORMAL HIGH (ref 3.8–10.8)

## 2020-08-29 LAB — LIPID PANEL
Cholesterol: 122 mg/dL (ref <200)
HDL: 22 mg/dL — ABNORMAL LOW (ref >=40)
LDL Cholesterol (Calc): 74 mg/dL (calc)
Non-HDL Cholesterol (Calc): 100 mg/dL (calc) (ref <130)
Total CHOL/HDL Ratio: 5.5 (calc) — ABNORMAL HIGH (ref <5.0)
Triglycerides: 165 mg/dL — ABNORMAL HIGH (ref <150)

## 2020-08-29 LAB — TSH: TSH: 0.34 mIU/L — ABNORMAL LOW (ref 0.40–4.50)

## 2020-08-29 LAB — MAGNESIUM: Magnesium: 2 mg/dL (ref 1.5–2.5)

## 2020-08-29 MED ORDER — LOSARTAN POTASSIUM 100 MG PO TABS: 100.0000 mg | ORAL_TABLET | Freq: Every day | ORAL | 3 refills | Status: DC

## 2020-09-12 ENCOUNTER — Telehealth: Payer: Self-pay | Admitting: Hematology

## 2020-09-12 NOTE — Telephone Encounter (Signed)
R/s per 3/7 los , Patient did not answer, left message.

## 2020-09-27 ENCOUNTER — Other Ambulatory Visit: Payer: Self-pay

## 2020-09-27 ENCOUNTER — Ambulatory Visit (INDEPENDENT_AMBULATORY_CARE_PROVIDER_SITE_OTHER): Payer: Medicare Other

## 2020-09-27 DIAGNOSIS — Z79899 Other long term (current) drug therapy: Secondary | ICD-10-CM | POA: Diagnosis not present

## 2020-09-27 DIAGNOSIS — I10 Essential (primary) hypertension: Secondary | ICD-10-CM | POA: Diagnosis not present

## 2020-09-27 DIAGNOSIS — E782 Mixed hyperlipidemia: Secondary | ICD-10-CM

## 2020-09-27 DIAGNOSIS — E039 Hypothyroidism, unspecified: Secondary | ICD-10-CM | POA: Diagnosis not present

## 2020-09-27 DIAGNOSIS — N289 Disorder of kidney and ureter, unspecified: Secondary | ICD-10-CM | POA: Diagnosis not present

## 2020-09-27 NOTE — Progress Notes (Signed)
Patient presents to the office for a nurse visit to have TSH and Kidney functions checked. Patient states that he is taking his thyroid medication, 1 tablet daily. No questions or concerns. Vitals taken and recorded.

## 2020-09-28 LAB — BASIC METABOLIC PANEL WITH GFR
BUN: 22 mg/dL (ref 7–25)
CO2: 30 mmol/L (ref 20–32)
Calcium: 9.4 mg/dL (ref 8.6–10.3)
Chloride: 101 mmol/L (ref 98–110)
Creat: 1.06 mg/dL (ref 0.70–1.11)
GFR, Est African American: 76 mL/min/{1.73_m2} (ref >=60)
GFR, Est Non African American: 66 mL/min/{1.73_m2} (ref >=60)
Glucose, Bld: 121 mg/dL — ABNORMAL HIGH (ref 65–99)
Potassium: 4.4 mmol/L (ref 3.5–5.3)
Sodium: 137 mmol/L (ref 135–146)

## 2020-09-28 LAB — TSH: TSH: 0.78 mIU/L (ref 0.40–4.50)

## 2020-10-01 DIAGNOSIS — Z95810 Presence of automatic (implantable) cardiac defibrillator: Secondary | ICD-10-CM | POA: Diagnosis not present

## 2020-10-01 DIAGNOSIS — I480 Paroxysmal atrial fibrillation: Secondary | ICD-10-CM | POA: Diagnosis not present

## 2020-10-01 DIAGNOSIS — Z7901 Long term (current) use of anticoagulants: Secondary | ICD-10-CM | POA: Diagnosis not present

## 2020-10-01 DIAGNOSIS — I255 Ischemic cardiomyopathy: Secondary | ICD-10-CM | POA: Diagnosis not present

## 2020-10-11 DIAGNOSIS — Z23 Encounter for immunization: Secondary | ICD-10-CM | POA: Diagnosis not present

## 2020-10-23 ENCOUNTER — Other Ambulatory Visit: Payer: Medicare Other

## 2020-10-23 ENCOUNTER — Ambulatory Visit: Payer: Medicare Other | Admitting: Hematology

## 2020-10-24 ENCOUNTER — Other Ambulatory Visit: Payer: Self-pay

## 2020-10-24 ENCOUNTER — Encounter: Payer: Self-pay | Admitting: Adult Health

## 2020-10-24 ENCOUNTER — Ambulatory Visit (INDEPENDENT_AMBULATORY_CARE_PROVIDER_SITE_OTHER): Payer: Medicare Other | Admitting: Adult Health

## 2020-10-24 VITALS — BP 112/62 | HR 70 | Temp 95.5°F | Wt 250.0 lb

## 2020-10-24 DIAGNOSIS — H6121 Impacted cerumen, right ear: Secondary | ICD-10-CM | POA: Diagnosis not present

## 2020-10-24 DIAGNOSIS — G4733 Obstructive sleep apnea (adult) (pediatric): Secondary | ICD-10-CM | POA: Diagnosis not present

## 2020-10-24 DIAGNOSIS — Z789 Other specified health status: Secondary | ICD-10-CM | POA: Diagnosis not present

## 2020-10-24 DIAGNOSIS — R413 Other amnesia: Secondary | ICD-10-CM | POA: Diagnosis not present

## 2020-10-24 DIAGNOSIS — I2510 Atherosclerotic heart disease of native coronary artery without angina pectoris: Secondary | ICD-10-CM | POA: Diagnosis not present

## 2020-10-24 NOTE — Progress Notes (Signed)
Assessment and Plan:  Tyronne was seen today for ear fullness.  Diagnoses and all orders for this visit:  Hearing loss of right ear due to cerumen impaction - stop using Qtips, irrigation used in the office without complications, use OTC drops/oil at home to prevent reoccurence - get hearing checked - has planned free hearing test at [facility]; will refer to ENT if atypical findings  OSA (obstructive sleep apnea) Intolerance of continuous positive airway pressure (CPAP) ventilation Referral for evaluation by Dental sleep specialist Dr. Augustina Mood, CPAP mask intolerance, ? Candidate for oral device May improve memory  -     Ambulatory referral to Sleep Studies  Memory changes Gradual progressive with + family history; unremarkable lab workup in 2020 and had been declining further; today agreeable to neuro evaluation; referrral placed  -     Ambulatory referral to Neurology   Further disposition pending results of labs. Discussed med's effects and SE's.   Over 15 minutes of exam, counseling, chart review, and critical decision making was performed.   Future Appointments  Date Time Provider [place]  10/24/2020  1:45 PM [name], NP [facility] None  11/14/2020  2:30 PM [facility] None  11/14/2020  3:00 PM Brunetta Genera, MD [facility] None  11/29/2020  2:30 PM [name], MD [facility] None  05/28/2021 10:00 AM [name], MD [facility] None    ------------------------------------------------------------------------------------------------------------------   HPI BP 112/62   Pulse 70   Temp (!) 95.5 F (35.3 C)   Wt 250 lb (113.4 kg)   SpO2 95%   BMI 36.92 kg/m   80 y.o.male presents for evaluation of R ear fullness and decreased hearing.   He notes at baseline has high pitched tinnitis; has noted TV volume is much higher than previous. Wife noted wax in ears and put ear drops in and attempted to irrigate with limited benefit.  He notes hearing is variable. Full sensation in R ear. He has free hearing test at [facility] planned but wanted ears cleaned out first.   He does has chronic rhinitis, takes antihistamine. Has tried various nasal sprays without benefit.   He has reported some short term memory changes over the last few years, progressive difficulty with word recall; has been declining imaging/neuro referral, did have unremarkable labs including HIV, RPR, TSH, B12 in 12/2018. Does have family history of dementia later in life. Today agreeable for neuro evaluation, noting increased poor follow through with medical treatments.   Notably does have OSA/COPD overlap, has CPAP but could not tolerate several masks, last sleep study remote. He has been sleeping in recliner. He has not tried oral device, interested in pursuing this.   Past Medical History:  Diagnosis Date  . A-fib ([place]) 01/2014  . AICD (automatic cardioverter/defibrillator) present    [employer]  . Anal fissure 11/10/2019  . Arthritis   . ASHD (arteriosclerotic heart disease) 2015   s/p CABG  . Asthma   . CAD (coronary artery disease)   . Cataract    s/p left  . CHF (congestive heart failure) ([place])   . COPD (chronic obstructive pulmonary disease) ([place])    by CXR, pt unsure of this  . Diverticulosis   . GERD (gastroesophageal reflux disease)   . Gout 2014  . Hyperlipidemia   . Hypertension   . Hypothyroidism   . LBBB (left bundle branch block)   . Polycythemia   . Pre-diabetes   . Prediabetes 01/2014  . Presence of permanent cardiac pacemaker   .  Sleep apnea    no CPAP  . Thyroid disease    hypothyroid     Allergies  Allergen Reactions  . Other Swelling    SODIUM SENSITIVE    Current Outpatient Medications on File Prior to Visit  Medication Sig  . acetaminophen (TYLENOL) 500 MG tablet Take 500 mg by mouth as needed for mild pain.  Marland Kitchen albuterol (PROVENTIL) (2.5 MG/3ML) 0.083% nebulizer solution Use   1 vial   4 x /day   or every  4 hours for Wheezing or Shortness of Breath (Patient not taking: No sig reported)  . allopurinol (ZYLOPRIM) 300 MG tablet Take      1 tablet      Daily       to Prevent Gout  . AMBULATORY NON FORMULARY MEDICATION Nitroglycerine ointment 0.125 % - Apply a pea sized amount to your rectum to the first knuckle three times daily.  Marland Kitchen aspirin EC 81 MG tablet Take 81 mg by mouth daily.  . Cholecalciferol (VITAMIN D PO) Take 10,000 Units by mouth daily.  . diphenhydrAMINE (BENADRYL) 25 MG tablet Take 25 mg by mouth at bedtime.  . Docosahexaenoic Acid (DHA PO) Take 500 mg by mouth daily. + EPA 250 mg  . docusate sodium (COLACE) 100 MG capsule Take 100 mg by mouth 2 (two) times daily. 1 tablet in the morning and 1 tablet at bedtime  . doxazosin (CARDURA) 4 MG tablet TAKE 1 TABLET EVERY DAY  . finasteride (PROSCAR) 5 MG tablet TAKE 1 TABLET EVERY DAY FOR PROSTATE  . furosemide (LASIX) 40 MG tablet Take 1 tablet (40 mg total) by mouth daily. (Patient taking differently: Take 40 mg by mouth 2 (two) times daily.)  . hydroxyurea (HYDREA) 500 MG capsule TAKE 2 CAPSULES ONE TIME DAILY WITH FOOD  . isosorbide mononitrate (IMDUR) 30 MG 24 hr tablet Take by mouth.  . levothyroxine (SYNTHROID) 200 MCG tablet TAKE 1 TAB DAILY ON EMPTY STOMACH WITH ONLY WATER FOR 30 MINS. NO ANTACIDS, CALCIUM OR MAGNESIUM FOR 4 HOURS. AVOID BIOTIN.  Marland Kitchen losartan (COZAAR) 100 MG tablet Take 1 tablet (100 mg total) by mouth daily.  . magnesium oxide (MAG-OX) 400 MG tablet Take by mouth.  . MELATONIN PO Take 1 tablet by mouth as needed.  . metoprolol (TOPROL-XL) 200 MG 24 hr tablet Take 200 mg by mouth daily.  . Multiple Vitamin (MULTIVITAMIN) tablet Take 1 tablet by mouth daily. A-Z  . Omega-3 Fatty Acids (FISH OIL) 1000 MG CAPS Take by mouth.  Marland Kitchen omeprazole (PRILOSEC) 20 MG capsule TAKE 1 CAPSULE TWICE DAILY  ( EVERY 12 HOURS  ) TO PREVENT HEARTBURN AND ACID REFLUX  . OVER THE COUNTER MEDICATION OTC Biotin daily.  . polyethylene glycol  powder (GLYCOLAX/MIRALAX) 17 GM/SCOOP powder Take 17 g by mouth daily.  . Probiotic Product (PROBIOTIC-10 PO) Take by mouth daily.  . rosuvastatin (CRESTOR) 40 MG tablet TAKE 1 TABLET EVERY DAY FOR CHOLESTEROL  . spironolactone (ALDACTONE) 25 MG tablet TAKE 1 TABLET EVERY DAY FOR BLOOD PRESSURE AND HEART FAILURE  . TURMERIC PO Take 1,000 mg by mouth daily.  Marland Kitchen warfarin (COUMADIN) 5 MG tablet Take 1 to 2 tablets Daily as Directed  . zinc gluconate 50 MG tablet Take 50 mg by mouth daily.   No current facility-administered medications on file prior to visit.    ROS: all negative except above.   Physical Exam:  BP 112/62   Pulse 70   Temp (!) 95.5 F (35.3 C)  Wt 250 lb (113.4 kg)   SpO2 95%   BMI 36.92 kg/m   General Appearance: Well nourished, in no apparent distress. Eyes: PERRLA, EOMs, conjunctiva no swelling or erythema Sinuses: No Frontal/maxillary tenderness ENT/Mouth: L Ext aud canal clear, TM without erythema, bulging. R canal obstructed by wax. No erythema, swelling, or exudate on post pharynx.  Tonsils not swollen or erythematous. Mildly HOH, R > L.  Neck: Supple Respiratory: Respiratory effort normal Cardio: RRR with no MRGs. Brisk peripheral pulses without edema.  Lymphatics: Non tender without lymphadenopathy.  Musculoskeletal: slow steady gait.  Skin: Warm, dry without rashes, lesions, ecchymosis.  Neuro: Cranial nerves intact. Normal muscle tone, no cerebellar symptoms. Sensation intact. Some word finding difficulty, short term recall.  Psych: Awake and oriented X 3, normal affect, Insight and Judgment appropriate.     Izora Ribas, NP 1:42 PM [facility]

## 2020-10-30 ENCOUNTER — Other Ambulatory Visit: Payer: Self-pay | Admitting: Adult Health

## 2020-10-30 DIAGNOSIS — J449 Chronic obstructive pulmonary disease, unspecified: Secondary | ICD-10-CM

## 2020-10-30 DIAGNOSIS — G4733 Obstructive sleep apnea (adult) (pediatric): Secondary | ICD-10-CM

## 2020-11-13 ENCOUNTER — Telehealth: Payer: Self-pay | Admitting: Internal Medicine

## 2020-11-13 ENCOUNTER — Telehealth: Payer: Self-pay | Admitting: Hematology

## 2020-11-13 DIAGNOSIS — Z95810 Presence of automatic (implantable) cardiac defibrillator: Secondary | ICD-10-CM | POA: Diagnosis not present

## 2020-11-13 DIAGNOSIS — I255 Ischemic cardiomyopathy: Secondary | ICD-10-CM | POA: Diagnosis not present

## 2020-11-13 DIAGNOSIS — Z7901 Long term (current) use of anticoagulants: Secondary | ICD-10-CM | POA: Diagnosis not present

## 2020-11-13 DIAGNOSIS — I480 Paroxysmal atrial fibrillation: Secondary | ICD-10-CM | POA: Diagnosis not present

## 2020-11-13 NOTE — Chronic Care Management (AMB) (Signed)
  Chronic Care Management   Outreach Note  11/13/2020 Name: [name] [name] MRN: [id number] DOB: April 14, 1941  Referred by: [name], MD Reason for referral : No chief complaint on file.   An unsuccessful telephone outreach was attempted today. The patient was referred to the pharmacist for assistance with care management and care coordination.   Follow Up Plan:   Tatjana Dellinger Upstream Scheduler

## 2020-11-13 NOTE — Telephone Encounter (Signed)
Rescheduled upcoming appointment due to provider not in office. Patient is aware of changes. 

## 2020-11-14 ENCOUNTER — Inpatient Hospital Stay: Payer: Medicare Other | Attending: Hematology

## 2020-11-14 ENCOUNTER — Other Ambulatory Visit: Payer: Self-pay

## 2020-11-14 ENCOUNTER — Inpatient Hospital Stay: Payer: Medicare Other | Admitting: Hematology

## 2020-11-14 DIAGNOSIS — D471 Chronic myeloproliferative disease: Secondary | ICD-10-CM

## 2020-11-14 DIAGNOSIS — D75839 Thrombocytosis, unspecified: Secondary | ICD-10-CM | POA: Diagnosis not present

## 2020-11-14 DIAGNOSIS — Z1589 Genetic susceptibility to other disease: Secondary | ICD-10-CM

## 2020-11-14 LAB — CBC WITH DIFFERENTIAL/PLATELET
Abs Immature Granulocytes: 0.11 10*3/uL — ABNORMAL HIGH (ref 0.00–0.07)
Basophils Absolute: 0.1 10*3/uL (ref 0.0–0.1)
Basophils Relative: 1 %
Eosinophils Absolute: 0.1 10*3/uL (ref 0.0–0.5)
Eosinophils Relative: 1 %
HCT: 48.1 % (ref 39.0–52.0)
Hemoglobin: 15.5 g/dL (ref 13.0–17.0)
Immature Granulocytes: 1 %
Lymphocytes Relative: 13 %
Lymphs Abs: 1.4 10*3/uL (ref 0.7–4.0)
MCH: 34.5 pg — ABNORMAL HIGH (ref 26.0–34.0)
MCHC: 32.2 g/dL (ref 30.0–36.0)
MCV: 107.1 fL — ABNORMAL HIGH (ref 80.0–100.0)
Monocytes Absolute: 0.5 10*3/uL (ref 0.1–1.0)
Monocytes Relative: 4 %
Neutro Abs: 8.9 10*3/uL — ABNORMAL HIGH (ref 1.7–7.7)
Neutrophils Relative %: 80 %
Platelets: 294 10*3/uL (ref 150–400)
RBC: 4.49 MIL/uL (ref 4.22–5.81)
RDW: 15.3 % (ref 11.5–15.5)
WBC: 11 10*3/uL — ABNORMAL HIGH (ref 4.0–10.5)
nRBC: 0 % (ref 0.0–0.2)

## 2020-11-14 LAB — CMP (CANCER CENTER ONLY)
ALT: 20 U/L (ref 0–44)
AST: 26 U/L (ref 15–41)
Albumin: 3.9 g/dL (ref 3.5–5.0)
Alkaline Phosphatase: 55 U/L (ref 38–126)
Anion gap: 7 (ref 5–15)
BUN: 27 mg/dL — ABNORMAL HIGH (ref 8–23)
CO2: 27 mmol/L (ref 22–32)
Calcium: 9.2 mg/dL (ref 8.9–10.3)
Chloride: 102 mmol/L (ref 98–111)
Creatinine: 1.28 mg/dL — ABNORMAL HIGH (ref 0.61–1.24)
GFR, Estimated: 57 mL/min — ABNORMAL LOW (ref >60)
Glucose, Bld: 116 mg/dL — ABNORMAL HIGH (ref 70–99)
Potassium: 4.7 mmol/L (ref 3.5–5.1)
Sodium: 136 mmol/L (ref 135–145)
Total Bilirubin: 1.5 mg/dL — ABNORMAL HIGH (ref 0.3–1.2)
Total Protein: 6.5 g/dL (ref 6.5–8.1)

## 2020-11-15 ENCOUNTER — Telehealth: Payer: Self-pay | Admitting: Internal Medicine

## 2020-11-15 NOTE — Progress Notes (Signed)
  Chronic Care Management   Note  11/15/2020 Name: [name] [name] MRN: [id number] DOB: December 27, 1940  [name] is a 80 y.o. year old male who is a primary care patient of [name], MD. I reached out to [employer] by phone today in response to a referral sent by [name] PCP, [name], MD.   [name] was given information about Chronic Care Management services today including:  CCM service includes personalized support from designated clinical staff supervised by his physician, including individualized plan of care and coordination with other care providers 24/7 contact phone numbers for assistance for urgent and routine care needs. Service will only be billed when office clinical staff spend 20 minutes or more in a month to coordinate care. Only one practitioner may furnish and bill the service in a calendar month. The patient may stop CCM services at any time (effective at the end of the month) by phone call to the office staff.   Patient agreed to services and verbal consent obtained.   Follow up plan:   Tatjana Secretary/administrator

## 2020-11-15 NOTE — Progress Notes (Signed)
HEMATOLOGY/ONCOLOGY CLINIC NOTE  Date of Service: 11/16/2020  Patient Care Team: [name] Pinto, MD as PCP - General (Internal Medicine) [name] [name], [name] Cordia, MD as Referring Physician (Cardiology) [name] More Ronalee Red, MD as Referring Physician (Cardiology) [name] Genera, MD as Consulting Physician (Hematology) [name] [name], [facility] as Pharmacist (Pharmacist)  CHIEF COMPLAINTS/PURPOSE OF CONSULTATION:  F/u for JAK2 positive MPN  HISTORY OF PRESENTING ILLNESS:   [name] [name] is a wonderful 80 y.o. male who has been referred to [place] by Dr. Unk [name]  for evaluation and management of Thrombocytosis. He is accompanied today by his wife. The pt reports that he is doing well overall.   The pt reports that he has never had any blood clots.  He has atrial fibrillation and has taken Coumadin since 2015. He had heart surgery in 2015, and had his ICD placed in 2016. The pt also takes 81g aspirin daily.   The pt notes that he has not been aware of his high platelets previously, but only as of his most recent labs. He denies any recent major bleeds, surgeries, and other trauma. He denies having a splenectomy at any time as well.   The pt notes that his right kidney gets sore from time to time when he doesn't stay well hydrated. He also notes that when he urinates at these times, his urine is very dark. The pt adds that he had kidney stones one time. The pt is also taking Lasix and notes that he urinates frequently.   The pt adds that his gums have been very sore, even to touch from his tongue.  The pt notes that he does not want to use a CPAP after a bad experience with a previous sleep study that did diagnose him with sleep apnea. He notes that he falls asleep several times a day as well, falling asleep easily when he sits down, and denying falling asleep while driving. He also notes some increased difficulty remembering.   Most recent lab results (12/09/17) of CBC is as follows: all  values are WNL except for WBC at 11.9k, RDW at 18.4, PLT at 649k, ANC at 9.7k.  On review of systems, pt reports good energy levels, falling asleep several times a day, staying active, and denies recent surgery, recent trauma, bleeding, unexpected weight loss, pain along the spine, abdominal pains, and any other symptoms.   On Family Hx the pt reports prostate cancer.   INTERVAL HISTORY:   I connected with [name] [name] on 11/16/2020 by telephone and verified that I am speaking with the correct person using two identifiers.   I discussed the limitations of evaluation and management by telemedicine. The patient expressed understanding and agreed to proceed.   Other persons participating in the visit and their role in the encounter:                                                         - [name] [name], Medical Scribe     Patient's location: Home Provider's location: [place] [facility] [place] returns today for management and evaluation of his JAK2 positive MPN. The patient's last visit with [place] was on 07/23/2020. The pt reports that he is doing well overall.  The pt reports that he has been experiencing decreased appetite and has  lost around twenty pounds. He is not sure if this can be attributable to decreased leg swelling. There has been a big change in his appetite to not being very hungry and very small portions. He notes no issues tolerating the continued Hydroxyurea and notes no medication changes. He notes his INR has recently increased after usually being stable. The patient believes his memory issues may be connected to lack of oxygen sleeping and will thus be getting a sleep study soon.  Lab results 11/14/2020 of CBC w/diff and CMP is as follows: all values are WNL except for WBC of 11.0K, MCV of 107.1, Mch of 34.5, Neutro Abs of 8.9K, Glucose of 116, BUN of 27, Creatinine of 1.28, Total Bilirubin of 1.5, GFR est of 57.  On review of systems, pt reports decreased appetite,  weight loss, continues memory loss and denies bleeding issues, bloody/black stools, and any other symptoms.   MEDICAL HISTORY:  Past Medical History:  Diagnosis Date   A-fib ([place]) 01/2014   AICD (automatic cardioverter/defibrillator) present    [employer]   Anal fissure 11/10/2019   Arthritis    ASHD (arteriosclerotic heart disease) 2015   s/p CABG   Asthma    CAD (coronary artery disease)    Cataract    s/p left   CHF (congestive heart failure) (HCC)    COPD (chronic obstructive pulmonary disease) ([place])    by CXR, pt unsure of this   Diverticulosis    GERD (gastroesophageal reflux disease)    Gout 2014   Hyperlipidemia    Hypertension    Hypothyroidism    LBBB (left bundle branch block)    Polycythemia    Pre-diabetes    Prediabetes 01/2014   Presence of permanent cardiac pacemaker    Sleep apnea    no CPAP   Thyroid disease    hypothyroid    SURGICAL HISTORY: Past Surgical History:  Procedure Laterality Date   CARDIAC DEFIBRILLATOR PLACEMENT  07/2014   CATARACT EXTRACTION W/ INTRAOCULAR LENS IMPLANT Left    CORONARY ARTERY BYPASS GRAFT  11/2013   lumb laminectomy  [id number]   x 3   LUMBAR FUSION  2013   NASAL SEPTOPLASTY W/ TURBINOPLASTY Bilateral 01/30/2016   Procedure: NASAL SEPTOPLASTY WITH BILATERAL TURBINATE REDUCTION;  Surgeon: Rozetta Nunnery, MD;  Location: [place];  Service: ENT;  Laterality: Bilateral;   TONSILLECTOMY     UVULECTOMY N/A 01/30/2016   Procedure: Martyn Ehrich;  Surgeon: Rozetta Nunnery, MD;  Location: [place];  Service: ENT;  Laterality: N/A;    SOCIAL HISTORY: Social History   Socioeconomic History   Marital status: Married    Spouse name: [place]    Number of children: 2   Years of education: 13   Highest education level: Not on file  Occupational History   Occupation: Retired  Tobacco Use   Smoking status: Former    Packs/day: 2.00    Years: 25.00    Pack years: 50.00    Types: Cigarettes    Quit date: 05/20/1983     Years since quitting: 37.5   Smokeless tobacco: Never  Vaping Use   Vaping Use: Never used  Substance and Sexual Activity   Alcohol use: Not Currently    Alcohol/week: 0.0 standard drinks   Drug use: No   Sexual activity: Not on file  Other Topics Concern   Not on file  Social History Narrative   Lives with wife, [name]   Caffeine use: daily (Coffee)   Social Determinants  of Health   Financial Resource Strain: Not on file  Food Insecurity: Not on file  Transportation Needs: Not on file  Physical Activity: Not on file  Stress: Not on file  Social Connections: Not on file  Intimate Partner Violence: Not on file    FAMILY HISTORY: Family History  Problem Relation Age of Onset   Alzheimer's disease Mother    Mental illness Mother    Depression Mother    Heart disease Father 84       had 79 MI's & died 41 yo   Hypertension Father    Alcohol abuse Son    Stroke Maternal Grandmother    Dementia Maternal Grandmother    Heart attack Son     ALLERGIES:  is allergic to other.  MEDICATIONS:  Current Outpatient Medications  Medication Sig Dispense Refill   acetaminophen (TYLENOL) 500 MG tablet Take 500 mg by mouth as needed for mild pain.     albuterol (PROVENTIL) (2.5 MG/3ML) 0.083% nebulizer solution Use   1 vial   4 x /day   or every 4 hours for Wheezing or Shortness of Breath (Patient not taking: No sig reported) 90 mL 0   allopurinol (ZYLOPRIM) 300 MG tablet Take      1 tablet      Daily       to Prevent Gout 90 tablet 3   AMBULATORY NON FORMULARY MEDICATION Nitroglycerine ointment 0.125 % - Apply a pea sized amount to your rectum to the first knuckle three times daily. 30 g 3   aspirin EC 81 MG tablet Take 81 mg by mouth daily.     Cholecalciferol (VITAMIN D PO) Take 10,000 Units by mouth daily.     diphenhydrAMINE (BENADRYL) 25 MG tablet Take 25 mg by mouth at bedtime.     Docosahexaenoic Acid (DHA PO) Take 500 mg by mouth daily. + EPA 250 mg     docusate sodium  (COLACE) 100 MG capsule Take 100 mg by mouth 2 (two) times daily. 1 tablet in the morning and 1 tablet at bedtime     doxazosin (CARDURA) 4 MG tablet TAKE 1 TABLET EVERY DAY 90 tablet 3   finasteride (PROSCAR) 5 MG tablet TAKE 1 TABLET EVERY DAY FOR PROSTATE 90 tablet 3   furosemide (LASIX) 40 MG tablet Take 1 tablet (40 mg total) by mouth daily. (Patient taking differently: Take 40 mg by mouth 2 (two) times daily.) 90 tablet 3   hydroxyurea (HYDREA) 500 MG capsule TAKE 2 CAPSULES ONE TIME DAILY WITH FOOD 180 capsule 0   isosorbide mononitrate (IMDUR) 30 MG 24 hr tablet Take by mouth.     levothyroxine (SYNTHROID) 200 MCG tablet TAKE 1 TAB DAILY ON EMPTY STOMACH WITH ONLY WATER FOR 30 MINS. NO ANTACIDS, CALCIUM OR MAGNESIUM FOR 4 HOURS. AVOID BIOTIN. 90 tablet 3   losartan (COZAAR) 100 MG tablet Take 1 tablet (100 mg total) by mouth daily. 90 tablet 3   magnesium oxide (MAG-OX) 400 MG tablet Take by mouth.     MELATONIN PO Take 1 tablet by mouth as needed.     metoprolol (TOPROL-XL) 200 MG 24 hr tablet Take 200 mg by mouth daily.     Multiple Vitamin (MULTIVITAMIN) tablet Take 1 tablet by mouth daily. A-Z     Omega-3 Fatty Acids (FISH OIL) 1000 MG CAPS Take by mouth.     omeprazole (PRILOSEC) 20 MG capsule TAKE 1 CAPSULE TWICE DAILY  ( EVERY 12 HOURS  ) TO PREVENT  HEARTBURN AND ACID REFLUX 180 capsule 3   OVER THE COUNTER MEDICATION OTC Biotin daily.     polyethylene glycol powder (GLYCOLAX/MIRALAX) 17 GM/SCOOP powder Take 17 g by mouth daily.     Probiotic Product (PROBIOTIC-10 PO) Take by mouth daily.     rosuvastatin (CRESTOR) 40 MG tablet TAKE 1 TABLET EVERY DAY FOR CHOLESTEROL 90 tablet 3   spironolactone (ALDACTONE) 25 MG tablet TAKE 1 TABLET EVERY DAY FOR BLOOD PRESSURE AND HEART FAILURE 90 tablet 3   TURMERIC PO Take 1,000 mg by mouth daily.     warfarin (COUMADIN) 5 MG tablet Take 1 to 2 tablets Daily as Directed 145 tablet 0   zinc gluconate 50 MG tablet Take 50 mg by mouth daily.      No current facility-administered medications for this visit.    REVIEW OF SYSTEMS:   10 Point review of Systems was done is negative except as noted above.  PHYSICAL EXAMINATION: There were no vitals filed for this visit.  There were no vitals filed for this visit.  .There is no height or weight on file to calculate BMI.   Telehealth Visit.  LABORATORY DATA:  I have reviewed the data as listed  . CBC Latest Ref Rng & Units 11/14/2020 08/28/2020 07/23/2020  WBC 4.0 - 10.5 K/uL 11.0(H) 13.4(H) 12.2(H)  Hemoglobin 13.0 - 17.0 g/dL 15.5 16.2 15.8  Hematocrit 39.0 - 52.0 % 48.1 47.6 48.3  Platelets 150 - 400 K/uL 294 367 356    . CMP Latest Ref Rng & Units 11/14/2020 09/27/2020 08/28/2020  Glucose 70 - 99 mg/dL 116(H) 121(H) 107(H)  BUN 8 - 23 mg/dL 27(H) 22 28(H)  Creatinine 0.61 - 1.24 mg/dL 1.28(H) 1.06 1.37(H)  Sodium 135 - 145 mmol/L 136 137 135  Potassium 3.5 - 5.1 mmol/L 4.7 4.4 5.4(H)  Chloride 98 - 111 mmol/L 102 101 98  CO2 22 - 32 mmol/L 27 30 26   Calcium 8.9 - 10.3 mg/dL 9.2 9.4 9.9  Total Protein 6.5 - 8.1 g/dL 6.5 - 6.7  Total Bilirubin 0.3 - 1.2 mg/dL 1.5(H) - 1.1  Alkaline Phos 38 - 126 U/L 55 - -  AST 15 - 41 U/L 26 - 28  ALT 0 - 44 U/L 20 - 21    02/09/18 JAK2 Mutation Study:    02/09/18 BCR ABL FISH:    RADIOGRAPHIC STUDIES: I have personally reviewed the radiological images as listed and agreed with the findings in the report. No results found.  ASSESSMENT & PLAN:   80 y.o. male with  1. 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 Some associated leukocytosis. No history of splenectomy. No previous h/o VTE, but significant cardiac risk factors. -Patient's aspirin and Warfarin has been protective against vascular events   02/09/18 JAK2 mutation study revealed the JAK 2 mutation [id number] at 75% allele frequency consistent with Essential  thrombocytosis.   02/09/18 BCR ABL FISH revealed no evidence of BCR/ABL rearrangement   PLAN: -Discussed pt labwork, 11/14/2020; HCT stable, Plt normal, chemistries stable. -Advised pt we will continue with same dosage of Hydroxyurea at this time. -No need for phlebotomy at this time. -Recommended pt increase physical activity and walk 20-30 minutes daily. This should help increase appetite as well. -Goal for PLT between 200K and 400K - PLT currently at goal at 294K. -The pt has no prohibitive toxicities from continuing 1000 mg Hydroxyurea 7 days/week  at this time. -Continue daily baby Aspirin  -Will see back in 3 months with labs.  2. . Patient Active Problem List   Diagnosis Date Noted   MPN (myeloproliferative neoplasm) ([place]) 08/27/2020   JAK2 [id number] mutation 08/27/2020   Rectal pain 11/10/2019   Constipation 11/10/2019   Abnormal glucose 10/12/2019   Memory changes 01/10/2019   Polycythemia vera ([place]) 10/21/2018   Thrombocytosis 12/29/2017   Senile purpura ([place]) 09/01/2017   Emphysema of lung ([place]) 06/01/2017   Tortuous aorta ([place]) 06/01/2017   ASHD/CABG x 3V (11/2013) 02/06/2015   Acquired thrombophilia ([place]) 02/06/2015   Cardiac pacemaker/AICD (01/2014) 02/06/2015   Morbid obesity ([place]) 01/03/2015   HTN (hypertension) 01/02/2015   Hyperlipidemia, mixed 01/02/2015   Vitamin D deficiency 01/02/2015   GERD  01/02/2015   BPH (benign prostatic hyperplasia) 01/02/2015   Medication management 01/02/2015   Atrial fibrillation ([place]) 01/02/2015   Hypothyroidism 01/02/2015   OSA and COPD overlap syndrome ([place]) 01/02/2015  -continue f/u with PCP to optimize atherosclerotic risk factors.    FOLLOW UP: RTC with Dr Irene Limbo with labs in 3 months   The total time spent in the appointment was 20 minutes and more than 50% was on counseling and direct patient cares.   All of the patient's questions were answered with apparent satisfaction. The patient knows to call the clinic with any  problems, questions or concerns.   Sullivan Lone MD [place] AAHIVMS [facility] Hematology/Oncology Physician [name]  (Office):       [phone] (Work cell):  [phone] (Fax):           [phone]  11/16/2020 2:28 PM  I, [name] [name], am acting as scribe for Dr. Sullivan Lone, MD.   .I have reviewed the above documentation for accuracy and completeness, and I agree with the above. [name] Genera MD

## 2020-11-16 ENCOUNTER — Inpatient Hospital Stay: Payer: Medicare Other | Attending: Hematology | Admitting: Hematology

## 2020-11-16 ENCOUNTER — Other Ambulatory Visit: Payer: Self-pay

## 2020-11-16 DIAGNOSIS — Z79899 Other long term (current) drug therapy: Secondary | ICD-10-CM | POA: Insufficient documentation

## 2020-11-16 DIAGNOSIS — D75839 Thrombocytosis, unspecified: Secondary | ICD-10-CM | POA: Insufficient documentation

## 2020-11-16 DIAGNOSIS — D45 Polycythemia vera: Secondary | ICD-10-CM

## 2020-11-16 DIAGNOSIS — I4891 Unspecified atrial fibrillation: Secondary | ICD-10-CM | POA: Diagnosis not present

## 2020-11-16 DIAGNOSIS — Z7901 Long term (current) use of anticoagulants: Secondary | ICD-10-CM | POA: Insufficient documentation

## 2020-11-21 ENCOUNTER — Telehealth: Payer: Self-pay | Admitting: Hematology

## 2020-11-21 NOTE — Telephone Encounter (Signed)
Scheduled follow-up appointment per 7/1 los. Patient is aware. 

## 2020-11-22 ENCOUNTER — Encounter: Payer: Self-pay | Admitting: Hematology

## 2020-11-26 DIAGNOSIS — Z7901 Long term (current) use of anticoagulants: Secondary | ICD-10-CM | POA: Diagnosis not present

## 2020-11-26 DIAGNOSIS — Z95810 Presence of automatic (implantable) cardiac defibrillator: Secondary | ICD-10-CM | POA: Diagnosis not present

## 2020-11-26 DIAGNOSIS — I480 Paroxysmal atrial fibrillation: Secondary | ICD-10-CM | POA: Diagnosis not present

## 2020-11-26 DIAGNOSIS — I255 Ischemic cardiomyopathy: Secondary | ICD-10-CM | POA: Diagnosis not present

## 2020-11-28 DIAGNOSIS — I255 Ischemic cardiomyopathy: Secondary | ICD-10-CM | POA: Diagnosis not present

## 2020-11-29 ENCOUNTER — Ambulatory Visit (INDEPENDENT_AMBULATORY_CARE_PROVIDER_SITE_OTHER): Payer: Medicare Other | Admitting: Internal Medicine

## 2020-11-29 ENCOUNTER — Other Ambulatory Visit: Payer: Self-pay

## 2020-11-29 VITALS — BP 104/70 | HR 70 | Temp 96.8°F | Resp 17 | Ht 67.0 in | Wt 247.6 lb

## 2020-11-29 DIAGNOSIS — E559 Vitamin D deficiency, unspecified: Secondary | ICD-10-CM

## 2020-11-29 DIAGNOSIS — D45 Polycythemia vera: Secondary | ICD-10-CM | POA: Diagnosis not present

## 2020-11-29 DIAGNOSIS — E782 Mixed hyperlipidemia: Secondary | ICD-10-CM | POA: Diagnosis not present

## 2020-11-29 DIAGNOSIS — R7309 Other abnormal glucose: Secondary | ICD-10-CM | POA: Diagnosis not present

## 2020-11-29 DIAGNOSIS — Z950 Presence of cardiac pacemaker: Secondary | ICD-10-CM

## 2020-11-29 DIAGNOSIS — Z79899 Other long term (current) drug therapy: Secondary | ICD-10-CM | POA: Diagnosis not present

## 2020-11-29 DIAGNOSIS — M1000 Idiopathic gout, unspecified site: Secondary | ICD-10-CM

## 2020-11-29 DIAGNOSIS — I480 Paroxysmal atrial fibrillation: Secondary | ICD-10-CM

## 2020-11-29 DIAGNOSIS — E039 Hypothyroidism, unspecified: Secondary | ICD-10-CM | POA: Diagnosis not present

## 2020-11-29 DIAGNOSIS — I10 Essential (primary) hypertension: Secondary | ICD-10-CM | POA: Diagnosis not present

## 2020-11-29 MED ORDER — IPRATROPIUM BROMIDE 0.06 % NA SOLN: NASAL | 3 refills | Status: DC

## 2020-11-29 NOTE — Progress Notes (Signed)
Future Appointments  Date Time Provider [place]  11/29/2020  2:30 PM [name], MD [facility] None  12/18/2020 11:30 AM Rush Landmark, [place] [facility] None  12/27/2020  3:00 PM Dohmeier, Asencion Partridge, MD [facility] None  02/21/2021  1:45 PM [facility] None  02/21/2021  2:20 PM Brunetta Genera, MD [facility] None  05/28/2021 10:00 AM [name], MD [facility] None    History of Present Illness:       This very nice 80 y.o. MWM presents for 3 month follow up with HTN, ASHD HLD, Pre-Diabetes, Hypothyroidism, Vitamin D Deficiency, COPD & overlap w/OSA (intolerant to CPAP).  Patient has Gout quiescent on Allopurinol. Patient also has GERD controlled on Pantoprazole. Patient has Polycythemia Vera with thrombocythemia & is on Hydrea  followed by Dr  Irene Limbo at [facility].       Patient is treated for HTN & BP has been controlled at home. Today's BP: 104/70. Patient has had no complaints of any cardiac type chest pain, palpitations, dyspnea / orthopnea / PND, dizziness, claudication, or dependent edema.       Hyperlipidemia is controlled with diet & meds. Patient denies myalgias or other med SE's. Last Lipids were at goal except elevated Trig's:  Lab Results  Component Value Date   CHOL 122 08/28/2020   HDL 22 (L) 08/28/2020   LDLCALC 74 08/28/2020   TRIG 165 (H) 08/28/2020   CHOLHDL 5.5 (H) 08/28/2020     Also, the patient has history of  Morbid Obesity  (BMI 39+) and PreDiabetes (A1c 6.0% /2015) and has had no symptoms of reactive hypoglycemia, diabetic polys, paresthesias or visual blurring.  Last A1c was not at goal:  Lab Results  Component Value Date   HGBA1C 5.8 (H) 05/09/2020                                                         Further, the patient also has history of Vitamin D Deficiency ("39" /2016) and supplements vitamin D without any suspected side-effects. Last vitamin D was at goal:  Lab Results  Component Value  Date   VD25OH 64 05/09/2020     Current Outpatient Medications on File Prior to Visit  Medication Sig   acetaminophen (TYLENOL) 500 MG tablet Take 500 mg by mouth as needed for mild pain.   albuterol (PROVENTIL) (2.5 MG/3ML) 0.083% nebulizer solution Use   1 vial   4 x /day   or every 4 hours for Wheezing or Shortness of Breath (Patient not taking: No sig reported)   allopurinol (ZYLOPRIM) 300 MG tablet Take      1 tablet      Daily       to Prevent Gout   AMBULATORY NON FORMULARY MEDICATION Nitroglycerine ointment 0.125 % - Apply a pea sized amount to your rectum to the first knuckle three times daily.   aspirin EC 81 MG tablet Take 81 mg by mouth daily.   Cholecalciferol (VITAMIN D PO) Take 10,000 Units by mouth daily.   diphenhydrAMINE (BENADRYL) 25 MG tablet Take 25 mg by mouth at bedtime.   Docosahexaenoic Acid (DHA PO) Take 500 mg by mouth daily. + EPA 250 mg   docusate sodium (COLACE) 100 MG capsule Take 100 mg by mouth 2 (two) times daily.  1 tablet in the morning and 1 tablet at bedtime   doxazosin (CARDURA) 4 MG tablet TAKE 1 TABLET EVERY DAY   finasteride (PROSCAR) 5 MG tablet TAKE 1 TABLET EVERY DAY FOR PROSTATE   furosemide (LASIX) 40 MG tablet Take 1 tablet (40 mg total) by mouth daily. (Patient taking differently: Take 40 mg by mouth 2 (two) times daily.)   hydroxyurea (HYDREA) 500 MG capsule TAKE 2 CAPSULES ONE TIME DAILY WITH FOOD   isosorbide mononitrate (IMDUR) 30 MG 24 hr tablet Take by mouth.   levothyroxine (SYNTHROID) 200 MCG tablet TAKE 1 TAB DAILY ON EMPTY STOMACH WITH ONLY WATER FOR 30 MINS. NO ANTACIDS, CALCIUM OR MAGNESIUM FOR 4 HOURS. AVOID BIOTIN.   losartan (COZAAR) 100 MG tablet Take 1 tablet (100 mg total) by mouth daily.   magnesium oxide (MAG-OX) 400 MG tablet Take by mouth.   MELATONIN PO Take 1 tablet by mouth as needed.   metoprolol (TOPROL-XL) 200 MG 24 hr tablet Take 200 mg by mouth daily.   Multiple Vitamin (MULTIVITAMIN) tablet Take 1 tablet by mouth  daily. A-Z   Omega-3 Fatty Acids (FISH OIL) 1000 MG CAPS Take by mouth.   omeprazole (PRILOSEC) 20 MG capsule TAKE 1 CAPSULE TWICE DAILY  ( EVERY 12 HOURS  ) TO PREVENT HEARTBURN AND ACID REFLUX   OVER THE COUNTER MEDICATION OTC Biotin daily.   polyethylene glycol powder (GLYCOLAX/MIRALAX) 17 GM/SCOOP powder Take 17 g by mouth daily.   Probiotic Product (PROBIOTIC-10 PO) Take by mouth daily.   rosuvastatin (CRESTOR) 40 MG tablet TAKE 1 TABLET EVERY DAY FOR CHOLESTEROL   spironolactone (ALDACTONE) 25 MG tablet TAKE 1 TABLET EVERY DAY FOR BLOOD PRESSURE AND HEART FAILURE   TURMERIC PO Take 1,000 mg by mouth daily.   warfarin (COUMADIN) 5 MG tablet Take 1 to 2 tablets Daily as Directed   zinc gluconate 50 MG tablet Take 50 mg by mouth daily.     Allergies  Allergen Reactions   Other Swelling    SODIUM SENSITIVE     PMHx:   Past Medical History:  Diagnosis Date   A-fib ([place]) 01/2014   AICD (automatic cardioverter/defibrillator) present    [employer]   Anal fissure 11/10/2019   Arthritis    ASHD (arteriosclerotic heart disease) 2015   s/p CABG   Asthma    CAD (coronary artery disease)    Cataract    s/p left   CHF (congestive heart failure) (HCC)    COPD (chronic obstructive pulmonary disease) ([place])    by CXR, pt unsure of this   Diverticulosis    GERD (gastroesophageal reflux disease)    Gout 2014   Hyperlipidemia    Hypertension    Hypothyroidism    LBBB (left bundle branch block)    Polycythemia    Pre-diabetes    Prediabetes 01/2014   Presence of permanent cardiac pacemaker    Sleep apnea    no CPAP   Thyroid disease    hypothyroid     Immunization History  Administered Date(s) Administered   Influenza, High Dose Seasonal PF 04/23/2015, 02/18/2017, 03/29/2018, 02/12/2019   Influenza,inj,Quad PF,6+ Mos 01/31/2016   Moderna Sars-Covid-2 Vaccination 06/18/2019, 07/18/2019   Pneumococcal Conjugate-13 11/21/2014   Pneumococcal Polysaccharide-23 06/01/2017      Past Surgical History:  Procedure Laterality Date   CARDIAC DEFIBRILLATOR PLACEMENT  07/2014   CATARACT EXTRACTION W/ INTRAOCULAR LENS IMPLANT Left    CORONARY ARTERY BYPASS GRAFT  11/2013   lumb laminectomy  [phone]  x 3   LUMBAR FUSION  2013   NASAL SEPTOPLASTY W/ TURBINOPLASTY Bilateral 01/30/2016   Procedure: NASAL SEPTOPLASTY WITH BILATERAL TURBINATE REDUCTION;  Surgeon: Rozetta Nunnery, MD;  Location: [place];  Service: ENT;  Laterality: Bilateral;   TONSILLECTOMY     UVULECTOMY N/A 01/30/2016   Procedure: Martyn Ehrich;  Surgeon: Rozetta Nunnery, MD;  Location: [place];  Service: ENT;  Laterality: N/A;    FHx:    Reviewed / unchanged  SHx:    Reviewed / unchanged   Systems Review:  Constitutional: Denies fever, chills, wt changes, headaches, insomnia, fatigue, night sweats, change in appetite. Eyes: Denies redness, blurred vision, diplopia, discharge, itchy, watery eyes.  ENT: Denies discharge, congestion, post nasal drip, epistaxis, sore throat, earache, hearing loss, dental pain, tinnitus, vertigo, sinus pain, snoring.  CV: Denies chest pain, palpitations, irregular heartbeat, syncope, dyspnea, diaphoresis, orthopnea, PND, claudication or edema. Respiratory: denies cough, dyspnea, DOE, pleurisy, hoarseness, laryngitis, wheezing.  Gastrointestinal: Denies dysphagia, odynophagia, heartburn, reflux, water brash, abdominal pain or cramps, nausea, vomiting, bloating, diarrhea, constipation, hematemesis, melena, hematochezia  or hemorrhoids. Genitourinary: Denies dysuria, frequency, urgency, nocturia, hesitancy, discharge, hematuria or flank pain. Musculoskeletal: Denies arthralgias, myalgias, stiffness, jt. swelling, pain, limping or strain/sprain.  Skin: Denies pruritus, rash, hives, warts, acne, eczema or change in skin lesion(s). Neuro: No weakness, tremor, incoordination, spasms, paresthesia or pain. Psychiatric: Denies confusion, memory loss or sensory  loss. Endo: Denies change in weight, skin or hair change.  Heme/Lymph: No excessive bleeding, bruising or enlarged lymph nodes.  Physical Exam  BP 104/70   Pulse 70   Temp (!) 96.8 F (36 C)   Resp 17   Ht 5\' 7"  (1.702 m)   Wt 247 lb 9.6 oz (112.3 kg)   SpO2 96%   BMI 38.78 kg/m   Appears over nourished, well groomed  and in no distress.  Eyes: PERRLA, EOMs, conjunctiva no swelling or erythema. Sinuses: No frontal/maxillary tenderness ENT/Mouth: EAC's clear, TM's nl w/o erythema, bulging. Nares clear w/o erythema, swelling, exudates. Oropharynx clear without erythema or exudates. Oral hygiene is good. Tongue normal, non obstructing. Hearing intact.  Neck: Supple. Thyroid not palpable. Car 2+/2+ without bruits, nodes or JVD. Chest: Respirations nl with BS clear & equal w/o rales, rhonchi, wheezing or stridor.  Cor: Heart sounds normal w/ regular rate and rhythm without sig. murmurs, gallops, clicks or rubs. Peripheral pulses normal and equal  without edema.  Abdomen: Soft & bowel sounds normal. Non-tender w/o guarding, rebound, hernias, masses or organomegaly.  Lymphatics: Unremarkable.  Musculoskeletal: Full ROM all peripheral extremities, joint stability, 5/5 strength and normal gait.  Skin: Warm, dry without exposed rashes, lesions or ecchymosis apparent.  Neuro: Cranial nerves intact, reflexes equal bilaterally. Sensory-motor testing grossly intact. Tendon reflexes grossly intact.  Pysch: Alert & oriented x 3.  Insight and judgement nl & appropriate. No ideations.  Assessment and Plan:  1. Essential hypertension  - Continue medication, monitor blood pressure at home.  - Continue DASH diet.  Reminder to go to the ER if any CP,  SOB, nausea, dizziness, severe HA, changes vision/speech.   - CBC with Differential/Platelet - COMPLETE METABOLIC PANEL WITH GFR - Magnesium - TSH  2. Hyperlipidemia, mixed  - Continue diet/meds, exercise,& lifestyle modifications.  - Continue  monitor periodic cholesterol/liver & renal functions    - Lipid panel - TSH  3. Abnormal glucose  - Continue diet, exercise  - Lifestyle modifications.  - Monitor appropriate labs   - Hemoglobin A1c -  Insulin, random  4. Vitamin D deficiency  - Continue supplementation   - VITAMIN D 25 Hydroxy   5. Paroxysmal atrial fibrillation (HCC)  - TSH  6. Cardiac pacemaker/AICD (01/2014)   7. Hypothyroidism  - TSH  8. Polycythemia vera ([place])   9. Idiopathic gout  - Uric acid  10. Medication management  - CBC with Differential/Platelet - COMPLETE METABOLIC PANEL WITH GFR - Magnesium - Lipid panel - TSH - Hemoglobin A1c - Insulin, random - VITAMIN D 25 Hydroxy         Discussed  regular exercise, BP monitoring, weight control to achieve/maintain BMI less than 25 and discussed med and SE's. Recommended labs to assess and monitor clinical status with further disposition pending results of labs.  I discussed the assessment and treatment plan with the patient. The patient was provided an opportunity to ask questions and all were answered. The patient agreed with the plan and demonstrated an understanding of the instructions.  I provided over 30 minutes of exam, counseling, chart review and  complex critical decision making.        The patient was advised to call back or seek an in-person evaluation if the symptoms worsen or if the condition fails to improve as anticipated.   Kirtland Bouchard, MD

## 2020-11-30 LAB — CBC WITH DIFFERENTIAL/PLATELET
Absolute Monocytes: 471 cells/uL (ref 200–950)
Basophils Absolute: 99 cells/uL (ref 0–200)
Basophils Relative: 0.8 %
Eosinophils Absolute: 74 cells/uL (ref 15–500)
Eosinophils Relative: 0.6 %
HCT: 47.2 % (ref 38.5–50.0)
Hemoglobin: 16.4 g/dL (ref 13.2–17.1)
Lymphs Abs: 1438 cells/uL (ref 850–3900)
MCH: 35.3 pg — ABNORMAL HIGH (ref 27.0–33.0)
MCHC: 34.7 g/dL (ref 32.0–36.0)
MCV: 101.5 fL — ABNORMAL HIGH (ref 80.0–100.0)
MPV: 10.2 fL (ref 7.5–12.5)
Monocytes Relative: 3.8 %
Neutro Abs: 10317 cells/uL — ABNORMAL HIGH (ref 1500–7800)
Neutrophils Relative %: 83.2 %
Platelets: 351 10*3/uL (ref 140–400)
RBC: 4.65 10*6/uL (ref 4.20–5.80)
RDW: 14.6 % (ref 11.0–15.0)
Total Lymphocyte: 11.6 %
WBC: 12.4 10*3/uL — ABNORMAL HIGH (ref 3.8–10.8)

## 2020-11-30 LAB — HEMOGLOBIN A1C
Hgb A1c MFr Bld: 5.3 % of total Hgb (ref <5.7)
Mean Plasma Glucose: 105 mg/dL
eAG (mmol/L): 5.8 mmol/L

## 2020-11-30 LAB — COMPLETE METABOLIC PANEL WITH GFR
AG Ratio: 1.9 (calc) (ref 1.0–2.5)
ALT: 25 U/L (ref 9–46)
AST: 26 U/L (ref 10–35)
Albumin: 4.3 g/dL (ref 3.6–5.1)
Alkaline phosphatase (APISO): 56 U/L (ref 35–144)
BUN: 23 mg/dL (ref 7–25)
CO2: 28 mmol/L (ref 20–32)
Calcium: 9.7 mg/dL (ref 8.6–10.3)
Chloride: 99 mmol/L (ref 98–110)
Creat: 1.09 mg/dL (ref 0.70–1.22)
Globulin: 2.3 g/dL (calc) (ref 1.9–3.7)
Glucose, Bld: 112 mg/dL — ABNORMAL HIGH (ref 65–99)
Potassium: 5.2 mmol/L (ref 3.5–5.3)
Sodium: 135 mmol/L (ref 135–146)
Total Bilirubin: 1.2 mg/dL (ref 0.2–1.2)
Total Protein: 6.6 g/dL (ref 6.1–8.1)
eGFR: 69 mL/min/{1.73_m2} (ref >=60)

## 2020-11-30 LAB — LIPID PANEL
Cholesterol: 122 mg/dL (ref <200)
HDL: 22 mg/dL — ABNORMAL LOW (ref >=40)
LDL Cholesterol (Calc): 75 mg/dL (calc)
Non-HDL Cholesterol (Calc): 100 mg/dL (calc) (ref <130)
Total CHOL/HDL Ratio: 5.5 (calc) — ABNORMAL HIGH (ref <5.0)
Triglycerides: 148 mg/dL (ref <150)

## 2020-11-30 LAB — INSULIN, RANDOM: Insulin: 28 u[IU]/mL — ABNORMAL HIGH

## 2020-11-30 LAB — MAGNESIUM: Magnesium: 1.7 mg/dL (ref 1.5–2.5)

## 2020-11-30 LAB — TSH: TSH: 0.33 mIU/L — ABNORMAL LOW (ref 0.40–4.50)

## 2020-11-30 LAB — VITAMIN D 25 HYDROXY (VIT D DEFICIENCY, FRACTURES): Vit D, 25-Hydroxy: 82 ng/mL (ref 30–100)

## 2020-11-30 LAB — URIC ACID: Uric Acid, Serum: 4.9 mg/dL (ref 4.0–8.0)

## 2020-12-02 ENCOUNTER — Encounter: Payer: Self-pay | Admitting: Internal Medicine

## 2020-12-02 NOTE — Progress Notes (Signed)
============================================================ ============================================================  -    Total Chol = 122 -   Excellent   - Very low risk for Heart Attack  / Stroke ============================================================ ============================================================  -  A1c = 5.3% - Terrific - finally back in the Normal NonDiabetic range ! ============================================================ ============================================================  -  Vitamin D = 82 - Excellent ! ============================================================ ============================================================  -  All Else - CBC - Kidneys - Electrolytes - Liver - Magnesium & Thyroid    - all  Normal / OK ============================================================ ============================================================  -  Keep up the [place] Work  ============================================================ ============================================================

## 2020-12-04 DIAGNOSIS — I350 Nonrheumatic aortic (valve) stenosis: Secondary | ICD-10-CM | POA: Diagnosis not present

## 2020-12-04 DIAGNOSIS — I480 Paroxysmal atrial fibrillation: Secondary | ICD-10-CM | POA: Diagnosis not present

## 2020-12-04 DIAGNOSIS — I25810 Atherosclerosis of coronary artery bypass graft(s) without angina pectoris: Secondary | ICD-10-CM | POA: Diagnosis not present

## 2020-12-04 DIAGNOSIS — I255 Ischemic cardiomyopathy: Secondary | ICD-10-CM | POA: Diagnosis not present

## 2020-12-04 DIAGNOSIS — Z95810 Presence of automatic (implantable) cardiac defibrillator: Secondary | ICD-10-CM | POA: Diagnosis not present

## 2020-12-04 DIAGNOSIS — I2510 Atherosclerotic heart disease of native coronary artery without angina pectoris: Secondary | ICD-10-CM | POA: Diagnosis not present

## 2020-12-05 NOTE — Progress Notes (Signed)
PATIENT IS AWARE OF LAB RESULTS. [name] [name] [facility]

## 2020-12-16 DIAGNOSIS — E039 Hypothyroidism, unspecified: Secondary | ICD-10-CM | POA: Diagnosis not present

## 2020-12-16 DIAGNOSIS — I10 Essential (primary) hypertension: Secondary | ICD-10-CM | POA: Diagnosis not present

## 2020-12-17 ENCOUNTER — Telehealth: Payer: Self-pay | Admitting: Pharmacist

## 2020-12-17 NOTE — Progress Notes (Signed)
Name: [name] [name]  MRN: [id number] DOB: Mar 25, 1941  Reason for Encounter: Chart prep for CPP visit 08/02'@11'$ :30am    Conditions to be addressed/monitored: Atrial Fibrillation, HTN, HLD, and COPD, GERD, Vitamin D Deficiency, Hypothyroidism, Obstructive Sleep Apnea,    Recent office visits:  11/29/20-Dr. McKeown-Patient presented f/u for  HTN, ASHD HLD, Pre-Diabetes, Hypothyroidism, Vitamin D Deficiency, COPD & overlap w/OSA (intolerant to CPAP).  Patient has Gout quiescent on Allopurinol. Patient also has GERD controlled on Pantoprazole. Patient has Polycythemia Vera with thrombocythemia & is on Hydrea  followed by Dr  Irene Limbo at [facility]. Patient BP 104/70, Pulse 70. Medication Changes: Iprattropium Bromide 0.06%, Use 1 to 2 sprays each Nostril 3 x /day  10/24/2020-Dr. McKeown(Ashley Corbet, NP-Patient presented     for hearing loss of right cerumen, No med changes.    09/27/2020-Dr. McKeown(Melissa Berline Lopes, CMA) Patient presents for nurse visit to have TSH and Kidney functions checked and primary HTN. Patient states that he is taking his thyroid medication, 1 tablet daily. No questions or concerns. Vitals taken and recorded.   04/12/022-Dr. McKeown(Ashely Corbett, NP)Patient was seen for 3 month follow up on hypertension, cholesterol, prediabetes, obesity and vitamin D deficiency. No medication changes.     Recent consult visits:  11/28/2020([facility]) Dr. Orson Eva was seen for Ancillary Procedure. Visit diagnosis:Ischemic cardiomyopathy - Primary, Other specified forms of chronic ischemic heart disease.    11/16/2020-(Oncology)Dr. Brunetta Genera. Patient seen for Polyhemia vera. Chief Complaints/ purpose of consultation F/u for JAK2 positive MPN. No medication changes.   08/29/2020([facility]) Dr. Orson Eva was seen for Ischemic cardiomyopathy, No med changes.   07/23/2020-(Oncology)Dr. Brunetta Genera. Patient seen  for     MPN (myeloproliferative neoplasm), No medication changes.     Hospital visits:  None in previous 6 months  Medications: Outpatient Encounter Medications as of 12/17/2020  Medication Sig Note   acetaminophen (TYLENOL) 500 MG tablet Take 500 mg by mouth as needed for mild pain.    albuterol (PROVENTIL) (2.5 MG/3ML) 0.083% nebulizer solution Use   1 vial   4 x /day   or every 4 hours for Wheezing or Shortness of Breath (Patient not taking: No sig reported)    allopurinol (ZYLOPRIM) 300 MG tablet Take      1 tablet      Daily       to Prevent Gout    AMBULATORY NON FORMULARY MEDICATION Nitroglycerine ointment 0.125 % - Apply a pea sized amount to your rectum to the first knuckle three times daily.    aspirin EC 81 MG tablet Take 81 mg by mouth daily.    Cholecalciferol (VITAMIN D PO) Take 10,000 Units by mouth daily.    diphenhydrAMINE (BENADRYL) 25 MG tablet Take 25 mg by mouth at bedtime.    Docosahexaenoic Acid (DHA PO) Take 500 mg by mouth daily. + EPA 250 mg    docusate sodium (COLACE) 100 MG capsule Take 100 mg by mouth 2 (two) times daily. 1 tablet in the morning and 1 tablet at bedtime    doxazosin (CARDURA) 4 MG tablet TAKE 1 TABLET EVERY DAY    finasteride (PROSCAR) 5 MG tablet TAKE 1 TABLET EVERY DAY FOR PROSTATE    furosemide (LASIX) 40 MG tablet Take 1 tablet (40 mg total) by mouth daily. (Patient taking differently: Take 40 mg by mouth 2 (two) times daily.)    hydroxyurea (HYDREA) 500 MG capsule TAKE 2 CAPSULES ONE TIME DAILY WITH FOOD  ipratropium (ATROVENT) 0.06 % nasal spray Use 1 to 2 sprays each Nostril 3 x /day    isosorbide mononitrate (IMDUR) 30 MG 24 hr tablet Take by mouth.    levothyroxine (SYNTHROID) 200 MCG tablet TAKE 1 TAB DAILY ON EMPTY STOMACH WITH ONLY WATER FOR 30 MINS. NO ANTACIDS, CALCIUM OR MAGNESIUM FOR 4 HOURS. AVOID BIOTIN. 07/23/2020: 1 and 1/2 tablets on Sundays   losartan (COZAAR) 100 MG tablet Take 1 tablet (100 mg total) by mouth daily.     magnesium oxide (MAG-OX) 400 MG tablet Take by mouth.    MELATONIN PO Take 1 tablet by mouth as needed.    metoprolol (TOPROL-XL) 200 MG 24 hr tablet Take 200 mg by mouth daily.    Multiple Vitamin (MULTIVITAMIN) tablet Take 1 tablet by mouth daily. A-Z    Omega-3 Fatty Acids (FISH OIL) 1000 MG CAPS Take by mouth.    omeprazole (PRILOSEC) 20 MG capsule TAKE 1 CAPSULE TWICE DAILY  ( EVERY 12 HOURS  ) TO PREVENT HEARTBURN AND ACID REFLUX    OVER THE COUNTER MEDICATION OTC Biotin daily.    polyethylene glycol powder (GLYCOLAX/MIRALAX) 17 GM/SCOOP powder Take 17 g by mouth daily.    Probiotic Product (PROBIOTIC-10 PO) Take by mouth daily.    rosuvastatin (CRESTOR) 40 MG tablet TAKE 1 TABLET EVERY DAY FOR CHOLESTEROL    spironolactone (ALDACTONE) 25 MG tablet TAKE 1 TABLET EVERY DAY FOR BLOOD PRESSURE AND HEART FAILURE    TURMERIC PO Take 1,000 mg by mouth daily.    warfarin (COUMADIN) 5 MG tablet Take 1 to 2 tablets Daily as Directed 07/23/2020: Monday and Fridays takes 7.5 mg   zinc gluconate 50 MG tablet Take 50 mg by mouth daily.    No facility-administered encounter medications on file as of 12/17/2020.    Care Gaps: Tenanus/TDAP, Zoster Vaccines- Shingrix , COVID-19 Vaccine, INFLUENZA VACCINE Star Rating Drugs: Losartan '100mg'$ , 90 days supply/3 refills, [facility] Rosuvastatin '40mg'$ . 90 days supply, [place] Metoprolol '200mg'$ , 1 tablet daily, [facility] Please bring medications and supplements to appointment   Left message to confirm appt on 08/02 SIG: Rosary Lively, [place] [phone]

## 2020-12-18 ENCOUNTER — Ambulatory Visit (INDEPENDENT_AMBULATORY_CARE_PROVIDER_SITE_OTHER): Payer: Medicare Other | Admitting: Pharmacist

## 2020-12-18 DIAGNOSIS — I480 Paroxysmal atrial fibrillation: Secondary | ICD-10-CM

## 2020-12-18 DIAGNOSIS — N401 Enlarged prostate with lower urinary tract symptoms: Secondary | ICD-10-CM

## 2020-12-18 DIAGNOSIS — M1000 Idiopathic gout, unspecified site: Secondary | ICD-10-CM

## 2020-12-18 DIAGNOSIS — J449 Chronic obstructive pulmonary disease, unspecified: Secondary | ICD-10-CM

## 2020-12-18 DIAGNOSIS — I10 Essential (primary) hypertension: Secondary | ICD-10-CM

## 2020-12-18 DIAGNOSIS — E039 Hypothyroidism, unspecified: Secondary | ICD-10-CM

## 2020-12-18 DIAGNOSIS — G4733 Obstructive sleep apnea (adult) (pediatric): Secondary | ICD-10-CM

## 2020-12-18 DIAGNOSIS — E782 Mixed hyperlipidemia: Secondary | ICD-10-CM | POA: Diagnosis not present

## 2020-12-18 DIAGNOSIS — E559 Vitamin D deficiency, unspecified: Secondary | ICD-10-CM

## 2020-12-18 DIAGNOSIS — Z79899 Other long term (current) drug therapy: Secondary | ICD-10-CM

## 2020-12-18 DIAGNOSIS — R7309 Other abnormal glucose: Secondary | ICD-10-CM

## 2020-12-18 DIAGNOSIS — K219 Gastro-esophageal reflux disease without esophagitis: Secondary | ICD-10-CM

## 2020-12-18 NOTE — Progress Notes (Signed)
Chronic Care Management Pharmacy Note  12/23/2020 Name:  [name] [name] MRN:  [id number] DOB:  1941-04-04  Summary: Patient is doing well. Reports memory impairment (being followed by neurology)  Recommendations/Changes made from today's visit: Instructed to take Benadryl at night only as needed instead of nightly 2. Instructed to switch Vitamin D 10,000 IU from QD to QOD, vit D levels WNL 3. Recommended patient start exercising 15 minutes per day, patient agreed to start 4. Attempting PPI step down therapy 5. Check BP once a week   Subjective: [name] [name] is an 80 y.o. year old male who is a primary patient of [name], MD.  The CCM team was consulted for assistance with disease management and care coordination needs.    Engaged with patient by telephone for initial visit in response to provider referral for pharmacy case management and/or care coordination services.   Consent to Services:  The patient was given information about Chronic Care Management services, agreed to services, and gave verbal consent prior to initiation of services.  Please see initial visit note for detailed documentation.   Patient Care Team: [name], MD as PCP - General (Internal Medicine) Georg Ruddle, Ashok Cordia, MD as Referring Physician (Cardiology) Shirlee More Ronalee Red, MD as Referring Physician (Cardiology) Brunetta Genera, MD as Consulting Physician (Hematology) Rush Landmark, [facility] as Pharmacist (Pharmacist)  Recent office visits: 11/29/20-Dr. McKeown-Patient presented f/u for  HTN, ASHD HLD, Pre-Diabetes, Hypothyroidism, Vitamin D Deficiency, COPD & overlap w/OSA (intolerant to CPAP).  Patient has Gout quiescent on Allopurinol. Patient also has GERD controlled on Pantoprazole. Patient has Polycythemia Vera with thrombocythemia & is on Hydrea  followed by Dr  Irene Limbo at [facility]. Patient BP 104/70, Pulse 70. Medication Changes: Iprattropium Bromide 0.06%, Use 1 to 2  sprays each Nostril 3 x /day   10/24/2020-Dr. McKeown(Ashley Corbet, NP-Patient presented     for hearing loss of right cerumen, No med changes.    09/27/2020-Dr. McKeown(Melissa Berline Lopes, CMA) Patient presents for nurse visit to have TSH and Kidney functions checked and primary HTN. Patient states that he is taking his thyroid medication, 1 tablet daily. No questions or concerns. Vitals taken and recorded.    04/12/022-Dr. McKeown(Ashely Corbett, NP)Patient was seen for 3 month follow up on hypertension, cholesterol, prediabetes, obesity and vitamin D deficiency. No medication changes.    Recent consult visits: 11/28/2020([facility]) Dr. Orson Eva was seen for Ancillary Procedure. Visit diagnosis:Ischemic cardiomyopathy - Primary, Other specified forms of chronic ischemic heart disease.     11/16/2020-(Oncology)Dr. Brunetta Genera. Patient seen for Polyhemia vera. Chief Complaints/ purpose of consultation F/u for JAK2 positive MPN. No medication changes.   08/29/2020([facility]) Dr. Orson Eva was seen for Ischemic cardiomyopathy, No med changes.   07/23/2020-(Oncology)Dr. Brunetta Genera. Patient seen for     MPN (myeloproliferative neoplasm), No medication changes.     Hospital visits: None in previous 6 months   Objective:  Lab Results  Component Value Date   CREATININE 1.09 11/29/2020   BUN 23 11/29/2020   GFRNONAA 57 (L) 11/14/2020   GFRAA 76 09/27/2020   NA 135 11/29/2020   K 5.2 11/29/2020   CALCIUM 9.7 11/29/2020   CO2 28 11/29/2020   GLUCOSE 112 (H) 11/29/2020    Lab Results  Component Value Date/Time   HGBA1C 5.3 11/29/2020 02:42 PM   HGBA1C 5.8 (H) 05/09/2020 03:34 PM   MICROALBUR 2.0 05/09/2020 03:34 PM   MICROALBUR 1.5 04/12/2019 01:50 PM    Last  diabetic Eye exam:  Lab Results  Component Value Date/Time   HMDIABEYEEXA No Retinopathy 07/20/2018 12:00 AM    Last diabetic Foot exam: No results found for:  HMDIABFOOTEX   Lab Results  Component Value Date   CHOL 122 11/29/2020   HDL 22 (L) 11/29/2020   LDLCALC 75 11/29/2020   TRIG 148 11/29/2020   CHOLHDL 5.5 (H) 11/29/2020    Hepatic Function Latest Ref Rng & Units 11/29/2020 11/14/2020 08/28/2020  Total Protein 6.1 - 8.1 g/dL 6.6 6.5 6.7  Albumin 3.5 - 5.0 g/dL - 3.9 -  AST 10 - 35 U/L '26 26 28  ' ALT 9 - 46 U/L '25 20 21  ' Alk Phosphatase 38 - 126 U/L - 55 -  Total Bilirubin 0.2 - 1.2 mg/dL 1.2 1.5(H) 1.1  Bilirubin, Direct 0.0 - 0.2 mg/dL - - -    Lab Results  Component Value Date/Time   TSH 0.33 (L) 11/29/2020 02:42 PM   TSH 0.78 09/27/2020 02:38 PM    CBC Latest Ref Rng & Units 11/29/2020 11/14/2020 08/28/2020  WBC 3.8 - 10.8 Thousand/uL 12.4(H) 11.0(H) 13.4(H)  Hemoglobin 13.2 - 17.1 g/dL 16.4 15.5 16.2  Hematocrit 38.5 - 50.0 % 47.2 48.1 47.6  Platelets 140 - 400 Thousand/uL 351 294 367    Lab Results  Component Value Date/Time   VD25OH 82 11/29/2020 02:42 PM   VD25OH 64 05/09/2020 03:34 PM    Clinical ASCVD: Yes  The ASCVD Risk score Mikey Bussing DC Jr., et al., 2013) failed to calculate for the following reasons:   The 2013 ASCVD risk score is only valid for ages 60 to 41    Depression screen PHQ 2/9 12/02/2020 05/08/2020 10/19/2019  Decreased Interest 0 0 0  Down, Depressed, Hopeless 0 0 0  PHQ - 2 Score 0 0 0     See below as applicable - Other: ([id number] if Afib, MMRC or CAT for COPD, ACT, DEXA)  Social History   Tobacco Use  Smoking Status Former   Packs/day: 2.00   Years: 25.00   Pack years: 50.00   Types: Cigarettes   Quit date: 05/20/1983   Years since quitting: 37.6  Smokeless Tobacco Never   BP Readings from Last 3 Encounters:  12/02/20 104/70  10/24/20 112/62  08/28/20 110/62   Pulse Readings from Last 3 Encounters:  12/02/20 70  10/24/20 70  08/28/20 70   Wt Readings from Last 3 Encounters:  12/02/20 247 lb 9.6 oz (112.3 kg)  10/24/20 250 lb (113.4 kg)  08/28/20 253 lb (114.8 kg)   BMI  Readings from Last 3 Encounters:  12/02/20 38.78 kg/m  10/24/20 36.92 kg/m  08/28/20 37.36 kg/m    Assessment/Interventions: Review of patient past medical history, allergies, medications, health status, including review of consultants reports, laboratory and other test data, was performed as part of comprehensive evaluation and provision of chronic care management services.   SDOH:  (Social Determinants of Health) assessments and interventions performed: Yes SDOH Interventions    Flowsheet Row Most Recent Value  SDOH Interventions   Housing Interventions Intervention Not Indicated  Transportation Interventions Intervention Not Indicated      SDOH Screenings   Alcohol Screen: Not on file  Depression (PHQ2-9): Low Risk    PHQ-2 Score: 0  Financial Resource Strain: Not on file  Food Insecurity: Not on file  Housing: [place] Risk Score: 0  Physical Activity: Not on file  Social Connections: Not on file  Stress: Not on  file  Tobacco Use: Medium Risk   Smoking Tobacco Use: Former   Smokeless Tobacco Use: Never  Transportation Needs: No Data processing manager (Medical): No   Lack of Transportation (Non-Medical): No    CCM Care Plan  Allergies  Allergen Reactions   Other Swelling    SODIUM SENSITIVE    Medications Reviewed Today     Reviewed by Rush Landmark, [facility] (Pharmacist) on 12/18/20 at 1223  Med List Status: <None>   Medication Order Taking? Sig Documenting Provider Last Dose Status Informant  acetaminophen (TYLENOL) 500 MG tablet [id number] Yes Take 500 mg by mouth as needed for mild pain. [provider] Taking Active   albuterol (PROVENTIL) (2.5 MG/3ML) 0.083% nebulizer solution [id number] No Use   1 vial   4 x /day   or every 4 hours for Wheezing or Shortness of Breath  Patient not taking: No sig reported   [name], MD Not Taking Active   allopurinol (ZYLOPRIM) 300 MG tablet [id number] Yes Take      1  tablet      Daily       to Prevent Gout [name], MD Taking Active   AMBULATORY NON [facility] MEDICATION [id number] Yes Nitroglycerine ointment 0.125 % - Apply a pea sized amount to your rectum to the first knuckle three times daily. Zehr, Laban Emperor, PA-C Taking Active   aspirin EC 81 MG tablet [id number] Yes Take 81 mg by mouth daily. [provider] Taking Active Self  Cholecalciferol (VITAMIN D PO) [id number] Yes Take 10,000 Units by mouth daily. [provider] Taking Active   diphenhydrAMINE (BENADRYL) 25 MG tablet [id number] Yes Take 25 mg by mouth at bedtime. [provider] Taking Active   Docosahexaenoic Acid (DHA PO) [id number] Yes Take 500 mg by mouth daily. + EPA 250 mg [provider] Taking Active   docusate sodium (COLACE) 100 MG capsule [id number] Yes Take 100 mg by mouth 2 (two) times daily. 1 tablet in the morning and 1 tablet at bedtime [provider] Taking Active   doxazosin (CARDURA) 4 MG tablet [id number] Yes TAKE 1 TABLET EVERY Deveron Furlong, MD Taking Active   finasteride (PROSCAR) 5 MG tablet [id number] Yes TAKE 1 TABLET EVERY DAY FOR PROSTATE [name], MD Taking Active   furosemide (LASIX) 40 MG tablet [id number] Yes Take 1 tablet (40 mg total) by mouth daily.  Patient taking differently: Take 40 mg by mouth 2 (two) times daily.   [name], MD Taking Active   hydroxyurea (HYDREA) 500 MG capsule [id number] Yes TAKE 2 CAPSULES ONE TIME DAILY WITH FOOD Brunetta Genera, MD Taking Active   ipratropium (ATROVENT) 0.06 % nasal spray [id number] No Use 1 to 2 sprays each Nostril 3 x /day  Patient not taking: Reported on 12/18/2020   [name], MD Not Taking Active   isosorbide mononitrate (IMDUR) 30 MG 24 hr tablet [id number] Yes Take by mouth daily. [provider] Taking Active   levothyroxine (SYNTHROID) 200 MCG tablet [id number] Yes TAKE 1 TAB DAILY ON EMPTY STOMACH WITH ONLY WATER FOR 30 MINS. NO  ANTACIDS, CALCIUM OR MAGNESIUM FOR 4 HOURS. AVOID BIOTIN. [name], MD Taking Active            Med Note Camillo Flaming, [facility]   Tue Dec 18, 2020 12:11 PM)    losartan (COZAAR) 100 MG tablet [id number] Yes Take 1 tablet (100 mg total) by mouth daily. [name], NP Taking Active   magnesium  oxide (MAG-OX) 400 MG tablet [id number] Yes Take by mouth. [provider] Taking Active   MELATONIN PO [id number] Yes Take 1 tablet by mouth as needed. [provider] Taking Active   metoprolol (TOPROL-XL) 200 MG 24 hr tablet [id number] Yes Take 200 mg by mouth daily. [provider] Taking Active   Multiple Vitamin (MULTIVITAMIN) tablet [id number] Yes Take 1 tablet by mouth daily. A-Z [provider] Taking Active   omeprazole (PRILOSEC) 20 MG capsule [id number] Yes TAKE 1 CAPSULE TWICE DAILY  ( EVERY 12 HOURS  ) TO PREVENT HEARTBURN AND ACID REFLUX [name], MD Taking Active   OVER THE [place] [id number] Yes OTC Biotin daily. [provider] Taking Active   polyethylene glycol powder (GLYCOLAX/MIRALAX) 17 GM/SCOOP powder [id number] Yes Take 17 g by mouth daily. [provider] Taking Active   Probiotic Product (PROBIOTIC-10 PO) [id number] Yes Take by mouth daily. [provider] Taking Active   rosuvastatin (CRESTOR) 40 MG tablet [id number] Yes TAKE 1 TABLET EVERY DAY FOR CHOLESTEROL [name], MD Taking Active   spironolactone (ALDACTONE) 25 MG tablet [id number] Yes TAKE 1 TABLET EVERY DAY FOR BLOOD PRESSURE AND HEART FAILURE [name], MD Taking Active   TURMERIC PO [id number] Yes Take 1,000 mg by mouth daily. [provider] Taking Active   warfarin (COUMADIN) 5 MG tablet [id number] Yes Take 1 to 2 tablets Daily as Directed  Patient taking differently: Take 1 to 2 tablets Daily as Directed (Taking 1.5 on Mondays)   [name], MD Taking Active            Med Note Camillo Flaming, [facility]   Tue Dec 18, 2020 12:16 PM)     zinc gluconate 50 MG tablet [id number] Yes Take 50 mg by mouth daily. [provider] Taking Active             Patient Active Problem List   Diagnosis Date Noted   MPN (myeloproliferative neoplasm) ([place]) 08/27/2020   JAK2 V617F mutation 08/27/2020   Rectal pain 11/10/2019   Constipation 11/10/2019   Abnormal glucose 10/12/2019   Memory changes 01/10/2019   Polycythemia vera ([place]) 10/21/2018   Thrombocytosis 12/29/2017   Senile purpura ([place]) 09/01/2017   Emphysema of lung ([place]) 06/01/2017   Tortuous aorta ([place]) 06/01/2017   ASHD/CABG x 3V (11/2013) 02/06/2015   Acquired thrombophilia ([place]) 02/06/2015   Cardiac pacemaker/AICD (01/2014) 02/06/2015   Morbid obesity ([place]) 01/03/2015   HTN (hypertension) 01/02/2015   Hyperlipidemia, mixed 01/02/2015   Vitamin D deficiency 01/02/2015   GERD  01/02/2015   BPH (benign prostatic hyperplasia) 01/02/2015   Medication management 01/02/2015   Atrial fibrillation ([place]) 01/02/2015   Hypothyroidism 01/02/2015   OSA and COPD overlap syndrome ([place]) 01/02/2015    Immunization History  Administered Date(s) Administered   Influenza, High Dose Seasonal PF 04/23/2015, 02/18/2017, 03/29/2018, 02/12/2019   Influenza,inj,Quad PF,6+ Mos 01/31/2016   Moderna Sars-Covid-2 Vaccination 06/18/2019, 07/18/2019   Pneumococcal Conjugate-13 11/21/2014   Pneumococcal Polysaccharide-23 06/01/2017    Conditions to be addressed/monitored:  Hypertension, Hyperlipidemia, Diabetes, Atrial Fibrillation, Heart Failure, Coronary Artery Disease, GERD, COPD, Hypothyroidism, BPH, and Gout  Vitamin D def  Care Plan : General Pharmacy (Adult)  Updates made by Rush Landmark, [place] since 12/23/2020 12:00 AM     Problem: Hypertension, Hyperlipidemia, Diabetes, Atrial Fibrillation, Heart Failure, Coronary Artery Disease, GERD, COPD, Hypothyroidism, BPH, and Gout   Priority: High     Long-Range Goal: DIsease Management   Start Date: 12/18/2020  Expected End Date:  06/20/2021  Note:   Current Barriers:  None identified  Pharmacist Clinical Goal(s):  Patient will achieve adherence to monitoring guidelines and medication adherence to achieve therapeutic efficacy through collaboration with PharmD and provider.   Interventions: 1:1 collaboration with [name], MD regarding development and update of comprehensive plan of care as evidenced by provider attestation and co-signature Inter-disciplinary care team collaboration (see longitudinal plan of care) Comprehensive medication review performed; medication list updated in electronic medical record  Hypertension  (Status:New goal.)   Med Management Intervention: None  (BP goal <140/90) -Controlled -Current treatment: Losartan 100 mg daily (appropriate, effective, safe, accessible) Metoprolol XL 200 mg daily (appropriate, effective, safe, accessible) Spironolactone 25 mg daily (appropriate, effective, safe, accessible) Furosemide 40 mg twice a day ((appropriate, effective, safe, accessible)  Additional dx: CHF, Afib -Medications previously tried: N/A  -Current home readings: Not checking often (always within normal limits) -Current dietary habits: Wife cooks and is health concious -Current exercise habits: None at this time -Denies hypotensive/hypertensive symptoms -Educated on BP goals and benefits of medications for prevention of heart attack, stroke and kidney damage; Daily salt intake goal < 2300 mg; Exercise goal of 150 minutes per week; Importance of home blood pressure monitoring; Proper BP monitoring technique; -Counseled to monitor BP at home once a week, document, and provide log at future appointments -Counseled on diet and exercise extensively Recommended to continue current medication Recommended 15 minutes of exercise per day   Hyperlipidemia: (LDL goal < 70) -Uncontrolled -Current treatment: Rosuvastatin 40 mg daily (appropriate, effective, safe, accessible) -Medications  previously tried: N/A  -Current dietary patterns: Balanced -Current exercise habits: None -Educated on Cholesterol goals;  Benefits of statin for ASCVD risk reduction; Importance of limiting foods high in cholesterol; Exercise goal of 150 minutes per week; -Counseled on diet and exercise extensively Recommended to continue current medication Recommended 15 minutes of exercise per day Educated on dietary changes  Pre-Diabetes (A1c goal < 5.7%) -Uncontrolled -Current medications: None -Medications previously tried: N/A   -Denies hypoglycemic/hyperglycemic symptoms -Current meal patterns: Attempts to avoid sugar and carbohydrates -Current exercise: None -Educated on A1c and blood sugar goals; Complications of diabetes including kidney damage, retinal damage, and cardiovascular disease; Exercise goal of 150 minutes per week; Benefits of weight loss; -Counseled to check feet daily and get yearly eye exams -Counseled on diet and exercise extensively Recommended 15 minutes of exercise per day  Atrial Fibrillation (Goal: prevent stroke and major bleeding) -Controlled -Current treatment: Rate control: Metoprolol XL 260m daily (appropriate, effective, safe, accessible)  Anticoagulation: Warfarin 5 mg daily (appropriate, effective, safe, accessible)  -Medications previously tried: N/A 01/2014;  AICD (Automatic cardioverter/defibrillator) present  -Home BP and HR readings: Not checking regularly  -Counseled on increased risk of stroke due to Afib and benefits of anticoagulation for stroke prevention; importance of adherence to anticoagulant exactly as prescribed; bleeding risk associated with medication and importance of self-monitoring for signs/symptoms of bleeding; avoidance of NSAIDs due to increased bleeding risk with anticoagulants; importance of regular laboratory monitoring; seeking medical attention after a head injury or if there is blood in the urine/stool; -Recommended to  continue current medication  Heart Failure (Goal: manage symptoms and prevent exacerbations) -Controlled -Last ejection fraction: 50-55% (Date: 11/2020 ) -HF type: Systolic -Current treatment: Spironolactone 25 mg daily (appropriate, effective, safe, accessible) Furosemide 40 mg daily twice daily (appropriate, effective, safe, accessible)  Metoprolol XL 200 mg daily (appropriate, effective, safe, accessible)  -Medications previously tried: N/A  -Current home BP/HR readings: Not checking regularly Not  checking weight at home but discussed with patient -Current dietary habits: healthy -Current exercise habits: gym at home but not using gym -Educated on Benefits of medications for managing symptoms and prolonging life Importance of blood pressure control -Counseled on diet and exercise extensively Recommended to continue current medication Counseled on CHF action plan  COPD (Goal: control symptoms and prevent exacerbations) -Controlled -Current treatment  None -Medications previously tried: N/A COPD associated with OSA Intolerant to CPAP  Had a recent CXR Little shortness of breath during the day Follow up with specialist coming up -Exacerbations requiring treatment in last 6 months: N/A -Patient denies consistent use of maintenance inhaler -Frequency of rescue inhaler use: None Waiting for specialist appointment before decisions are made related to inhaler usage  -Counseled on Benefits of consistent maintenance inhaler use When to use rescue inhaler Differences between maintenance and rescue inhalers -Counseled on diet and exercise extensively  Gout  -Controlled -Current treatment  Allopurinol 300 mg daily (appropriate, effective, safe, accessible) -Medications previously tried: N/A  -Counseled on diet and exercise extensively Avoid triggering foods (orange meat, red meat, alcohol, etc)  BPH  -Controlled -Current treatment  Doxazosin 4 mg daily (appropriate,  effective, safe, accessible) Finasteride 5 mg daily (appropriate, effective, safe, accessible) -Medications previously tried: N/A  -Recommended to continue current medication  GERD -Controlled -Current treatment  Omeprazole 20 mg twice a day (appropriate, effective,query safe) -Medications previously tried: N/A  -Counseled on diet and exercise extensively Recommended to continue current medication Counseled on lifestyle modifications to reduce GERD symptoms Recommended switching PPI to QD dosing Patient to inform CPP if GERD worsens  Arthritis -Controlled -Current treatment  Tylenol PRN ((appropriate, effective, safe, accessible)  -Medications previously tried: N/A  -Counseled on diet and exercise extensively Recommended to continue current medication Counseled on benefits of weight loss in reducing pressure on weight bearing joints Patient has "joint pain" once in a while Controlled by Tylenol Avoid NSAIDs (warfari for Afib)  Vitamin D Deficiency  -Controlled -Current treatment  Vitamin D 10,000 IU daily (query appropriate) -Medications previously tried: N/A  -Recommended taking medication QOD Vitamin D WNL at this time 2016: 39 12/22/201: 64   Health Maintenance -Vaccine gaps: Not discussed   Patient Goals/Self-Care Activities Patient will:  - take medications as prescribed  Follow Up Plan: Face to Face appointment with care management team member scheduled for:  11/8      Medication Assistance: None required.  Patient affirms current coverage meets needs.  Compliance/Adherence/Medication fill history: Care Gaps: Tenanus/TDAP, Zoster Vaccines- Shingrix , COVID-19 Vaccine, INFLUENZA VACCINE   Star-Rating Drugs: Losartan 147m, 90 days supply/3 refills, [facility] Rosuvastatin 445m 90 days supply, [facility] Metoprolol 20039m1 tablet daily, [facility] is:  [facility] - [address] [place]one: [phone]: [phone] [address] [place]: [phone]: [phone] pill box? Yes Pt endorses 100% compliance  We discussed: Current pharmacy is preferred with insurance plan and patient is satisfied with pharmacy services Patient decided to: Continue current medication management strategy  Care Plan and Follow Up Patient Decision:  Patient agrees to Care Plan and Follow-up.  Plan: Face to Face appointment with care management team member scheduled for: 03/26/2021

## 2020-12-23 NOTE — Patient Instructions (Signed)
Visit Information   PATIENT GOALS:   Goals Addressed             This Visit's Progress    Track and Manage Activity and Exertion-Heart Failure       Timeframe:  Long-Range Goal Priority:  High Start Date:                             Expected End Date:                       Follow Up Date 03/26/2021    - avoid heavy exercise on very hot days - drink water to stay hydrated during exercise - follow activity or exercise plan - make an activity or exercise plan - pace activity allowing for rest - track exercise in diary for two weeks - track symptoms during activity in diary - warm up and cool down for 10 minutes before and after exercise    Why is this important?   Exercising is very important when managing your heart failure.  It will help your heart get stronger.    Notes:      Track and Manage My Blood Pressure-Hypertension       Timeframe:  Long-Range Goal Priority:  High Start Date:                             Expected End Date:                       Follow Up Date 03/26/2021    - check blood pressure weekly - write blood pressure results in a log or diary    Why is this important?   You won't feel high blood pressure, but it can still hurt your blood vessels.  High blood pressure can cause heart or kidney problems. It can also cause a stroke.  Making lifestyle changes like losing a little weight or eating less salt will help.  Checking your blood pressure at home and at different times of the day can help to control blood pressure.  If the doctor prescribes medicine remember to take it the way the doctor ordered.  Call the office if you cannot afford the medicine or if there are questions about it.     Notes:         Consent to CCM Services: Mr. Shelton was given information about Chronic Care Management services including:  CCM service includes personalized support from designated clinical staff supervised by his physician, including individualized plan  of care and coordination with other care providers 24/7 contact phone numbers for assistance for urgent and routine care needs. Service will only be billed when office clinical staff spend 20 minutes or more in a month to coordinate care. Only one practitioner may furnish and bill the service in a calendar month. The patient may stop CCM services at any time (effective at the end of the month) by phone call to the office staff. The patient will be responsible for cost sharing (co-pay) of up to 20% of the service fee (after annual deductible is met).  Patient agreed to services and verbal consent obtained.   The patient verbalized understanding of instructions, educational materials, and care plan provided today and agreed to receive a mailed copy of patient instructions, educational materials, and care plan.   Face to Face appointment with   care management team member scheduled for:  03/26/2021  Signature:  S. , PharmD Clinical Pharmacist [email or web address] [phone]   CLINICAL CARE PLAN: Patient Care Plan: General Pharmacy (Adult)     Problem Identified: Hypertension, Hyperlipidemia, Diabetes, Atrial Fibrillation, Heart Failure, Coronary Artery Disease, GERD, COPD, Hypothyroidism, BPH, and Gout   Priority: High     Long-Range Goal: DIsease Management   Start Date: 12/18/2020  Expected End Date: 06/20/2021  Note:   Current Barriers:  None identified  Pharmacist Clinical Goal(s):  Patient will achieve adherence to monitoring guidelines and medication adherence to achieve therapeutic efficacy through collaboration with PharmD and provider.   Interventions: 1:1 collaboration with McKeown, William, MD regarding development and update of comprehensive plan of care as evidenced by provider attestation and co-signature Inter-disciplinary care team collaboration (see longitudinal plan of care) Comprehensive medication review performed; medication list updated in  electronic medical record  Hypertension  (Status:New goal.)   Med Management Intervention: None  (BP goal <140/90) -Controlled -Current treatment: Losartan 100 mg daily (appropriate, effective, safe, accessible) Metoprolol XL 200 mg daily (appropriate, effective, safe, accessible) Spironolactone 25 mg daily (appropriate, effective, safe, accessible) Furosemide 40 mg twice a day ((appropriate, effective, safe, accessible)  Additional dx: CHF, Afib -Medications previously tried: N/A  -Current home readings: Not checking often (always within normal limits) -Current dietary habits: Wife cooks and is health concious -Current exercise habits: None at this time -Denies hypotensive/hypertensive symptoms -Educated on BP goals and benefits of medications for prevention of heart attack, stroke and kidney damage; Daily salt intake goal < 2300 mg; Exercise goal of 150 minutes per week; Importance of home blood pressure monitoring; Proper BP monitoring technique; -Counseled to monitor BP at home once a week, document, and provide log at future appointments -Counseled on diet and exercise extensively Recommended to continue current medication Recommended 15 minutes of exercise per day   Hyperlipidemia: (LDL goal < 70) -Uncontrolled -Current treatment: Rosuvastatin 40 mg daily (appropriate, effective, safe, accessible) -Medications previously tried: N/A  -Current dietary patterns: Balanced -Current exercise habits: None -Educated on Cholesterol goals;  Benefits of statin for ASCVD risk reduction; Importance of limiting foods high in cholesterol; Exercise goal of 150 minutes per week; -Counseled on diet and exercise extensively Recommended to continue current medication Recommended 15 minutes of exercise per day Educated on dietary changes  Pre-Diabetes (A1c goal < 5.7%) -Uncontrolled -Current medications: None -Medications previously tried: N/A   -Denies hypoglycemic/hyperglycemic  symptoms -Current meal patterns: Attempts to avoid sugar and carbohydrates -Current exercise: None -Educated on A1c and blood sugar goals; Complications of diabetes including kidney damage, retinal damage, and cardiovascular disease; Exercise goal of 150 minutes per week; Benefits of weight loss; -Counseled to check feet daily and get yearly eye exams -Counseled on diet and exercise extensively Recommended 15 minutes of exercise per day  Atrial Fibrillation (Goal: prevent stroke and major bleeding) -Controlled -Current treatment: Rate control: Metoprolol XL 200mg daily (appropriate, effective, safe, accessible)  Anticoagulation: Warfarin 5 mg daily (appropriate, effective, safe, accessible)  -Medications previously tried: N/A 01/2014;  AICD (Automatic cardioverter/defibrillator) present  -Home BP and HR readings: Not checking regularly  -Counseled on increased risk of stroke due to Afib and benefits of anticoagulation for stroke prevention; importance of adherence to anticoagulant exactly as prescribed; bleeding risk associated with medication and importance of self-monitoring for signs/symptoms of bleeding; avoidance of NSAIDs due to increased bleeding risk with anticoagulants; importance of regular laboratory monitoring; seeking medical attention after a head injury or if there is   blood in the urine/stool; -Recommended to continue current medication  Heart Failure (Goal: manage symptoms and prevent exacerbations) -Controlled -Last ejection fraction: 50-55% (Date: 11/2020 ) -HF type: Systolic -Current treatment: Spironolactone 25 mg daily (appropriate, effective, safe, accessible) Furosemide 40 mg daily twice daily (appropriate, effective, safe, accessible)  Metoprolol XL 200 mg daily (appropriate, effective, safe, accessible)  -Medications previously tried: N/A  -Current home BP/HR readings: Not checking regularly Not checking weight at home but discussed with  patient -Current dietary habits: healthy -Current exercise habits: gym at home but not using gym -Educated on Benefits of medications for managing symptoms and prolonging life Importance of blood pressure control -Counseled on diet and exercise extensively Recommended to continue current medication Counseled on CHF action plan  COPD (Goal: control symptoms and prevent exacerbations) -Controlled -Current treatment  None -Medications previously tried: N/A COPD associated with OSA Intolerant to CPAP  Had a recent CXR Little shortness of breath during the day Follow up with specialist coming up -Exacerbations requiring treatment in last 6 months: N/A -Patient denies consistent use of maintenance inhaler -Frequency of rescue inhaler use: None Waiting for specialist appointment before decisions are made related to inhaler usage  -Counseled on Benefits of consistent maintenance inhaler use When to use rescue inhaler Differences between maintenance and rescue inhalers -Counseled on diet and exercise extensively  Gout  -Controlled -Current treatment  Allopurinol 300 mg daily (appropriate, effective, safe, accessible) -Medications previously tried: N/A  -Counseled on diet and exercise extensively Avoid triggering foods (orange meat, red meat, alcohol, etc)  BPH  -Controlled -Current treatment  Doxazosin 4 mg daily (appropriate, effective, safe, accessible) Finasteride 5 mg daily (appropriate, effective, safe, accessible) -Medications previously tried: N/A  -Recommended to continue current medication  GERD -Controlled -Current treatment  Omeprazole 20 mg twice a day (appropriate, effective,query safe) -Medications previously tried: N/A  -Counseled on diet and exercise extensively Recommended to continue current medication Counseled on lifestyle modifications to reduce GERD symptoms Recommended switching PPI to QD dosing Patient to inform CPP if GERD  worsens  Arthritis -Controlled -Current treatment  Tylenol PRN ((appropriate, effective, safe, accessible)  -Medications previously tried: N/A  -Counseled on diet and exercise extensively Recommended to continue current medication Counseled on benefits of weight loss in reducing pressure on weight bearing joints Patient has "joint pain" once in a while Controlled by Tylenol Avoid NSAIDs (warfari for Afib)  Vitamin D Deficiency  -Controlled -Current treatment  Vitamin D 10,000 IU daily (query appropriate) -Medications previously tried: N/A  -Recommended taking medication QOD Vitamin D WNL at this time 2016: 39 12/22/201: 64   Health Maintenance -Vaccine gaps: Not discussed   Patient Goals/Self-Care Activities Patient will:  - take medications as prescribed  Follow Up Plan: Face to Face appointment with care management team member scheduled for:  11/8

## 2020-12-24 DIAGNOSIS — I255 Ischemic cardiomyopathy: Secondary | ICD-10-CM | POA: Diagnosis not present

## 2020-12-24 DIAGNOSIS — Z95810 Presence of automatic (implantable) cardiac defibrillator: Secondary | ICD-10-CM | POA: Diagnosis not present

## 2020-12-24 DIAGNOSIS — I480 Paroxysmal atrial fibrillation: Secondary | ICD-10-CM | POA: Diagnosis not present

## 2020-12-24 DIAGNOSIS — Z7901 Long term (current) use of anticoagulants: Secondary | ICD-10-CM | POA: Diagnosis not present

## 2020-12-27 ENCOUNTER — Encounter: Payer: Self-pay | Admitting: Neurology

## 2020-12-27 ENCOUNTER — Ambulatory Visit (INDEPENDENT_AMBULATORY_CARE_PROVIDER_SITE_OTHER): Payer: Medicare Other | Admitting: Neurology

## 2020-12-27 VITALS — BP 82/62 | HR 70 | Ht 67.0 in | Wt 245.0 lb

## 2020-12-27 DIAGNOSIS — D45 Polycythemia vera: Secondary | ICD-10-CM

## 2020-12-27 DIAGNOSIS — I2510 Atherosclerotic heart disease of native coronary artery without angina pectoris: Secondary | ICD-10-CM

## 2020-12-27 DIAGNOSIS — Z950 Presence of cardiac pacemaker: Secondary | ICD-10-CM

## 2020-12-27 DIAGNOSIS — Z1589 Genetic susceptibility to other disease: Secondary | ICD-10-CM | POA: Diagnosis not present

## 2020-12-27 DIAGNOSIS — J449 Chronic obstructive pulmonary disease, unspecified: Secondary | ICD-10-CM | POA: Diagnosis not present

## 2020-12-27 DIAGNOSIS — G4733 Obstructive sleep apnea (adult) (pediatric): Secondary | ICD-10-CM | POA: Diagnosis not present

## 2020-12-27 DIAGNOSIS — D471 Chronic myeloproliferative disease: Secondary | ICD-10-CM | POA: Diagnosis not present

## 2020-12-27 NOTE — Patient Instructions (Signed)
Sleep Study, Adult A sleep study (polysomnogram) is a series of tests done while you are sleeping. A sleep study records your brain waves, heart rate, breathing rate, oxygen level, and eye and legmovements. A sleep study helps your health care provider: See how well you sleep. Diagnose a sleep disorder. Determine how severe your sleep disorder is. Create a plan to treat your sleep disorder. Your health care provider may recommend a sleep study if you: Feel sleepy on most days. Snore loudly while sleeping. Have unusual behaviors while you sleep, such as walking. Have brief periods in which you stop breathing during sleep (sleepapnea). Fall asleep suddenly during the day (narcolepsy). Have trouble falling asleep or staying asleep (insomnia). Feel like you need to move your legs when trying to fall asleep (restless legs syndrome). Move your legs by flexing and extending them regularly while asleep (periodic limb movement disorder). Act out your dreams while you sleep (sleep behavior disorder). Feel like you cannot move when you first wake up (sleep paralysis). What tests are part of a sleep study? Most sleep studies record the following during sleep: Brain activity. Eye movements. Heart rate and rhythm. Breathing rate and rhythm. Blood-oxygen level. Blood pressure. Chest and belly movement as you breathe. Arm and leg movements. Snoring or other noises. Body position. Where are sleep studies done? Sleep studies are done at sleep centers. A sleep center may be inside [place], office, or clinic. The room where you have the study may look like a hospital room or a hotel room. The health care providers doing the study may come in and out of the room during the study. Most of the time, they will be in another room monitoringyour test as you sleep. How are sleep studies done? Most sleep studies are done during a normal period of time for a full night of sleep. You will arrive at the study  center in the evening and go home in themorning. Before the test Bring your pajamas and toothbrush with you to the sleep study. Do not have caffeine on the day of your sleep study. Do not drink alcohol on the day of your sleep study. Your health care provider will let you know if you should stop taking any of your regular medicines before the test. During the test     Round, sticky patches with sensors attached to recording wires (electrodes) are placed on your scalp, face, chest, and limbs. Wires from all the electrodes and sensors run from your bed to a computer. The wires can be taken off and put back on if you need to get out of bed to go to the bathroom. A sensor is placed over your nose to measure airflow. A finger clip is put on your finger or ear to measure your blood oxygen level (pulse oximetry). A belt is placed around your belly and a belt is placed around your chest to measure breathing movements. If you have signs of the sleep disorder called sleep apnea during your test, you may get a treatment mask to wear for the second half of the night. The mask provides positive airway pressure (PAP) to help you breathe better during sleep. This may greatly improve your sleep apnea. You will then have all tests done again with the mask in place to see if your measurements and recordings change. After the test A medical doctor who specializes in sleep will evaluate the results of your sleep study and share them with you and your primary health care provider.  Based on your results, your medical history, and a physical exam, you may be diagnosed with a sleep disorder, such as: Sleep apnea. Restless legs syndrome. Sleep-related behavior disorder. Sleep-related movement disorders. Sleep-related seizure disorders. Your health care team will help determine your treatment options based on your diagnosis. This may include: Improving your sleep habits (sleep hygiene). Wearing a continuous  positive airway pressure (CPAP) or bi-level positive airway pressure (BPAP) mask. Wearing an oral device at night to improve breathing and reduce snoring. Taking medicines. Follow these instructions at home: Take over-the-counter and prescription medicines only as told by your health care provider. If you are instructed to use a CPAP or BPAP mask, make sure you use it nightly as directed. Make any lifestyle changes that your health care provider recommends. If you were given a device to open your airway while you sleep, use it only as told by your health care provider. Do not use any tobacco products, such as cigarettes, chewing tobacco, and e-cigarettes. If you need help quitting, ask your health care provider. Keep all follow-up visits as told by your health care provider. This is important. Summary A sleep study (polysomnogram) is a series of tests done while you are sleeping. It shows how well you sleep. Most sleep studies are done over one full night of sleep. You will arrive at the study center in the evening and go home in the morning. If you have signs of the sleep disorder called sleep apnea during your test, you may get a treatment mask to wear for the second half of the night. A medical doctor who specializes in sleep will evaluate the results of your sleep study and share them with your primary health care provider. This information is not intended to replace advice given to you by your health care provider. Make sure you discuss any questions you have with your healthcare provider. Document Revised: 06/10/2019 Document Reviewed: 06/02/2017 [employer]vier Patient Education  2022 [employer].

## 2020-12-27 NOTE — Progress Notes (Signed)
SLEEP MEDICINE CLINIC    Provider:  Larey Seat, MD  Primary Care Physician:  [name], MD [address] [place] [address]     Referring Provider: [name], [place],  [place] [address]          Chief Complaint according to patient   Patient presents with:     New Patient (Initial Visit)     Patient wants to be evaluated for memory loss...      HISTORY OF PRESENT ILLNESS:  [name] [name] is a 80 y.o.  Caucasian male patient seen here as a referral on 12/27/2020 from PCP  for a Sleep Consultation .  Chief concern according to patient :  "Internal referral for repeat sleep study. Prior SS done in [place] about 7 years ago, pt was unable to complete study. Pt states his mouth drops open and is having a hard time sleeping at night. Pt states he has noticed some memory concerns. Pt believes his poor sleep has been affecting his memory. Would like to learn more about inspire".    I have the pleasure of seeing [name] [name] today, a right-handed White or Caucasian male with a known sleep disorder.  She has a  has a past medical history of  A-fib ([place]) (01/2014),  AICD (automatic cardioverter/defibrillator) present, Anal fissure (11/10/2019), Arthritis, ASHD (arteriosclerotic heart disease) (2015), Asthma,  CAD (coronary artery disease), Cataract,  CHF (congestive heart failure) ([place]),  COPD (chronic obstructive pulmonary disease) ([place]), Diverticulosis,  GERD (gastroesophageal reflux disease), Gout (2014), Hyperlipidemia, Hypertension, Hypothyroidism,  LBBB (left bundle branch block),  Polycythemia, Prediabetes (01/2014),  Sleep apnea, and Thyroid disease. OBESITY - BMI 38- excluded from inspire.    The patient had the first sleep study in the year 2016 in [place] and tested positive for OSA, was given CPAP the same night . He reportedly walked out as he couldn't tolerate the CPAP pressure.    Sleep  relevant medical history: cant sleep on his back- at night having nasal airway obstruction, reports memory loss, Nocturia 3-5 times, Sleep walking in childhood,  Tonsillectomy in child hood,  sinus disease , deviated septum repair. Right nasion restricted.    Family medical /sleep history: father died at age 7 after 4 heart attacks, all his paternal uncles and brothers died under 32 years old / Mother had dementia at 24, died at 74 - was nocturnal active.  No DM, no HTN, no other family member on CPAP with OSA.  Social history:  Former Museum/gallery curator, also worked in Designer, jewellery, Patient is retired from being a Holiday representative for [employer]- and lives in a household with his second spouse, has 2 adult- biological- children, and 4 step children , 7 great grand children.  Tobacco use until 40 years ago- 20 ppd- .  ETOH use ; seldomly,  Caffeine intake ; decaffeinated coffee- 2 cups in Am and sodas, 1 a day, coke zero. . Regular exercise in form of walking, home projects. .   Hobbies :      Sleep habits are as follows: The patient's dinner time is between 6-7 PM. The patient goes to bed at 2-3 AM , and continues to sleep for 7 hours, wakes for 4 bathroom breaks, the first time at 4 AM.    BP (!) 82/62   Pulse 70   Ht '5\' 7"'$  (1.702 m)   Wt 245 lb (111.1 kg)   BMI  38.37 kg/m   The preferred sleep position is sideways , with the support of 1 pillow.  Dreams are reportedly rare.  11  AM is the usual rise time. The patient wakes up spontaneously.  He reports feeling refreshed or restored in AM, with symptoms such as dry mouth,  Naps are not taken .    Review of Systems: Out of a complete 14 system review, the patient complains of only the following symptoms, and all other reviewed systems are negative.:  Fatigue, sleepiness , snoring, fragmented sleep, Insomnia    How likely are you to doze in the following situations: 0 = not likely, 1 = slight chance, 2 = moderate chance, 3 = high  chance   Sitting and Reading? Watching Television? Sitting inactive in a public place (theater or meeting)? As a passenger in a car for an hour without a break? Lying down in the afternoon when circumstances permit? Sitting and talking to someone? Sitting quietly after lunch without alcohol? In a car, while stopped for a few minutes in traffic?   Total = 2/ 24 points   FSS endorsed at 25/ 63 points.   Social History   Socioeconomic History   Marital status: Married    Spouse name: [name] [name]    Number of children: 2   Years of education: 13   Highest education level: Not on file  Occupational History   Occupation: Retired  Tobacco Use   Smoking status: Former    Packs/day: 2.00    Years: 25.00    Pack years: 50.00    Types: Cigarettes    Quit date: 05/20/1983    Years since quitting: 37.6   Smokeless tobacco: Never  Vaping Use   Vaping Use: Never used  Substance and Sexual Activity   Alcohol use: Not Currently    Alcohol/week: 0.0 standard drinks   Drug use: No   Sexual activity: Not on file  Other Topics Concern   Not on file  Social History Narrative   Lives with wife, [name] [name]   Caffeine use: daily (Coffee)   Social Determinants of Health   Financial Resource Strain: Not on file  Food Insecurity: Not on file  Transportation Needs: No Transportation Needs   Lack of Transportation (Medical): No   Lack of Transportation (Non-Medical): No  Physical Activity: Not on file  Stress: Not on file  Social Connections: Not on file    Family History  Problem Relation Age of Onset   Alzheimer's disease Mother    Mental illness Mother    Depression Mother    Heart disease Father 64       had 64 MI's & died 66 yo   Hypertension Father    Alcohol abuse Son    Stroke Maternal Grandmother    Dementia Maternal Grandmother    Heart attack Son     Past Medical History:  Diagnosis Date   A-fib ([place]) 01/2014   AICD (automatic cardioverter/defibrillator) present    [place] fissure 11/10/2019   Arthritis    ASHD (arteriosclerotic heart disease) 2015   s/p CABG   Asthma    CAD (coronary artery disease)    Cataract    s/p left   CHF (congestive heart failure) ([place])    COPD (chronic obstructive pulmonary disease) ([place])    by CXR, pt unsure of this   Diverticulosis    GERD (gastroesophageal reflux disease)    Gout 2014   Hyperlipidemia    Hypertension  Hypothyroidism    LBBB (left bundle branch block)    Polycythemia    Pre-diabetes    Prediabetes 01/2014   Presence of permanent cardiac pacemaker    Sleep apnea    no CPAP   Thyroid disease    hypothyroid    Past Surgical History:  Procedure Laterality Date   CARDIAC DEFIBRILLATOR PLACEMENT  07/2014   CATARACT EXTRACTION W/ INTRAOCULAR LENS IMPLANT Left    CORONARY ARTERY BYPASS GRAFT  11/2013   lumb laminectomy  [id number]   x 3   LUMBAR FUSION  2013   NASAL SEPTOPLASTY W/ TURBINOPLASTY Bilateral 01/30/2016   Procedure: NASAL SEPTOPLASTY WITH BILATERAL TURBINATE REDUCTION;  Surgeon: Rozetta Nunnery, MD;  Location: [facility];  Service: ENT;  Laterality: Bilateral;   TONSILLECTOMY     UVULECTOMY N/A 01/30/2016   Procedure: Martyn Ehrich;  Surgeon: Rozetta Nunnery, MD;  Location: [facility];  Service: ENT;  Laterality: N/A;     Current Outpatient Medications on File Prior to Visit  Medication Sig Dispense Refill   acetaminophen (TYLENOL) 500 MG tablet Take 500 mg by mouth as needed for mild pain.     albuterol (PROVENTIL) (2.5 MG/3ML) 0.083% nebulizer solution Use   1 vial   4 x /day   or every 4 hours for Wheezing or Shortness of Breath (Patient taking differently: Use   1 vial   4 x /day   or every 4 hours for Wheezing or Shortness of Breath) 90 mL 0   allopurinol (ZYLOPRIM) 300 MG tablet Take      1 tablet      Daily       to Prevent Gout 90 tablet 3   AMBULATORY NON FORMULARY MEDICATION Nitroglycerine ointment 0.125 % - Apply a pea sized amount to your rectum to the first  knuckle three times daily. 30 g 3   aspirin EC 81 MG tablet Take 81 mg by mouth daily.     Cholecalciferol (VITAMIN D PO) Take 10,000 Units by mouth daily.     diphenhydrAMINE (BENADRYL) 25 MG tablet Take 25 mg by mouth at bedtime.     Docosahexaenoic Acid (DHA PO) Take 500 mg by mouth daily. + EPA 250 mg     docusate sodium (COLACE) 100 MG capsule Take 100 mg by mouth 2 (two) times daily. 1 tablet in the morning and 1 tablet at bedtime (stool softener)     doxazosin (CARDURA) 4 MG tablet TAKE 1 TABLET EVERY DAY 90 tablet 3   finasteride (PROSCAR) 5 MG tablet TAKE 1 TABLET EVERY DAY FOR PROSTATE 90 tablet 3   furosemide (LASIX) 40 MG tablet Take 40 mg by mouth 2 (two) times daily.     hydroxyurea (HYDREA) 500 MG capsule TAKE 2 CAPSULES ONE TIME DAILY WITH FOOD 180 capsule 0   ipratropium (ATROVENT) 0.06 % nasal spray Use 1 to 2 sprays each Nostril 3 x /day 45 mL 3   isosorbide mononitrate (IMDUR) 30 MG 24 hr tablet Take by mouth daily.     levothyroxine (SYNTHROID) 200 MCG tablet TAKE 1 TAB DAILY ON EMPTY STOMACH WITH ONLY WATER FOR 30 MINS. NO ANTACIDS, CALCIUM OR MAGNESIUM FOR 4 HOURS. AVOID BIOTIN. 90 tablet 3   losartan (COZAAR) 100 MG tablet Take 1 tablet (100 mg total) by mouth daily. 90 tablet 3   magnesium oxide (MAG-OX) 400 MG tablet Take by mouth.     MELATONIN PO Take 1 tablet by mouth as needed.     metoprolol (TOPROL-XL)  200 MG 24 hr tablet Take 200 mg by mouth daily.     Multiple Vitamin (MULTIVITAMIN) tablet Take 1 tablet by mouth daily. A-Z     omeprazole (PRILOSEC) 20 MG capsule TAKE 1 CAPSULE TWICE DAILY  ( EVERY 12 HOURS  ) TO PREVENT HEARTBURN AND ACID REFLUX 180 capsule 3   OVER THE COUNTER MEDICATION OTC Biotin daily.     polyethylene glycol powder (GLYCOLAX/MIRALAX) 17 GM/SCOOP powder Take 17 g by mouth daily.     Probiotic Product (PROBIOTIC-10 PO) Take by mouth daily.     rosuvastatin (CRESTOR) 40 MG tablet TAKE 1 TABLET EVERY DAY FOR CHOLESTEROL 90 tablet 3    spironolactone (ALDACTONE) 25 MG tablet TAKE 1 TABLET EVERY DAY FOR BLOOD PRESSURE AND HEART FAILURE 90 tablet 3   TURMERIC PO Take 1,000 mg by mouth daily.     warfarin (COUMADIN) 5 MG tablet Take 1 to 2 tablets Daily as Directed (Patient taking differently: Take 1 to 2 tablets Daily as Directed (Taking 1.5 on Mondays)) 145 tablet 0   zinc gluconate 50 MG tablet Take 50 mg by mouth daily.     No current facility-administered medications on file prior to visit.    Allergies  Allergen Reactions   Other Swelling    SODIUM SENSITIVE    Physical exam:  Today's Vitals   12/27/20 1525  BP: (!) 82/62  Pulse: 70  Weight: 245 lb (111.1 kg)  Height: '5\' 7"'$  (1.702 m)   Body mass index is 38.37 kg/m.   Wt Readings from Last 3 Encounters:  12/27/20 245 lb (111.1 kg)  12/02/20 247 lb 9.6 oz (112.3 kg)  10/24/20 250 lb (113.4 kg)     Ht Readings from Last 3 Encounters:  12/27/20 '5\' 7"'$  (1.702 m)  12/02/20 '5\' 7"'$  (1.702 m)  07/23/20 '5\' 9"'$  (1.753 m)      General: The patient is awake, alert and appears not in acute distress. The patient is well groomed. Head: Normocephalic, atraumatic. Neck is supple. Mallampati 3,  neck circumference:16 inches . Nasal airflow only on the left  patent.  Retrognathia is not seen.  Dental status: biological  Cardiovascular: irregular rate and cardiac rhythm by pulse,   without distended neck veins. Respiratory: Lungs are clear to auscultation.  Skin:  With evidence of severe ankle edema. Trunk: The patient's posture is erect.   Neurologic exam : The patient is awake and alert, oriented to place and time.   Memory subjective described as intact.  Attention span & concentration ability appears normal.  Speech is fluent, without  dysarthria, dysphonia - but he has memory lapses mid sentences. .  Mood and affect are appropriate.   Cranial nerves: no loss of smell or taste reported  Pupils are equal and briskly reactive to light. Funduscopic exam  deferred.  Extraocular movements in vertical and horizontal planes were intact and without nystagmus. No Diplopia. Visual fields by finger perimetry are intact. Hearing was intact to soft voice and finger rubbing.    Facial sensation intact to fine touch.  Facial motor strength is symmetric and tongue and uvula move midline.  Neck ROM : rotation, tilt and flexion extension were normal for age and shoulder shrug was symmetrical.    Motor exam:  Symmetric bulk, tone and ROM.   Normal tone without cog-wheeling, symmetric grip strength .   Sensory:  Fine touch, pinprick and vibration were reduced over both ankles. Proprioception tested in the upper extremities was normal.   Coordination: Rapid  alternating movements in the fingers/hands were of normal speed.  The Finger-to-nose maneuver was intact without evidence of ataxia, dysmetria or tremor.   Gait and station: Patient could rise unassisted from a seated position, walked without assistive device.  Stance is of normal width/ base and the patient turned with 3 steps.  Toe and heel walk were deferred.  Deep tendon reflexes: in the  upper and lower extremities are attenuated, trace only-  Babinski response was deferred.       After spending a total time of  60 minutes face to face and additional time for physical and neurologic examination, review of laboratory studies,  personal review of imaging studies, reports and results of other testing and review of referral information / records as far as provided in visit, I have established the following assessments:  Mr. Lapp reports his sleep being very fragmented due to nocturia he goes frequently to the bathroom at night.  He has a habit of going to bed late but he also has been getting enough sleep as he sleeps in in the morning.  Usually his sleep is restorative and refreshing.  He wakes up with a dry mouth so and suspects that he is a mouth breather overnight.  He does have rhinitis and at  night the nasal passages often swell shot.  He had already undergone a nasal septal repair surgery but it has not completely restored the ability to breathe especially his right nausea and is smaller.  He has erythrocythemia.  He was affected in his memory, had problems with history, remembering dates, words, events.   He has bilateral ankle edema and has a very extensive cardiac history which I have recalculated in the first part of this visit.  1) I do think that he is very likely to have a mix of central and obstructive sleep apnea given his cardiac history of congestive heart failure, hypotension at night, elevated BMI, high-grade Mallampati, irregular heartbeats ongoing.  Defibrillator/ pacemaker, COPD/ emphysema/ morning confusion.    2) memory lapses, MCI ? Will need another visit to address this. This can be related to hypoxemia. He has also erythrocythemia - usually promoted by low oxygen saturation levels.    My Plan is to proceed with:  1) attended sleep study due to extensive heart rhythm and rate abnormalities, CHF, hypoxia and oral breathing.     CC Dr. Estill Bakes, [facility] -  He was referred by   [name], MD [address] [place],  [place] [address] for allowing me to meet with and to take care of this pleasant patient.   I plan to follow up either personally or through our NP within 3-4 month.   CC: I will share my notes with PCP .  Electronically signed by: Larey Seat, MD 12/27/2020 3:47 PM  [facility] Board certified by The [employer] of Sleep Medicine and Diplomate of the [employer] of Sleep Medicine. Board certified In Neurology through the [place], Fellow of the [employer] of Neurology. Medical Director of [facility].

## 2021-01-16 ENCOUNTER — Encounter: Payer: Self-pay | Admitting: Internal Medicine

## 2021-01-16 DIAGNOSIS — I10 Essential (primary) hypertension: Secondary | ICD-10-CM | POA: Diagnosis not present

## 2021-01-16 DIAGNOSIS — E039 Hypothyroidism, unspecified: Secondary | ICD-10-CM | POA: Diagnosis not present

## 2021-01-22 ENCOUNTER — Other Ambulatory Visit: Payer: Self-pay | Admitting: Hematology

## 2021-01-23 ENCOUNTER — Encounter: Payer: Self-pay | Admitting: Hematology

## 2021-02-04 DIAGNOSIS — Z95810 Presence of automatic (implantable) cardiac defibrillator: Secondary | ICD-10-CM | POA: Diagnosis not present

## 2021-02-04 DIAGNOSIS — Z7901 Long term (current) use of anticoagulants: Secondary | ICD-10-CM | POA: Diagnosis not present

## 2021-02-04 DIAGNOSIS — I255 Ischemic cardiomyopathy: Secondary | ICD-10-CM | POA: Diagnosis not present

## 2021-02-04 DIAGNOSIS — I480 Paroxysmal atrial fibrillation: Secondary | ICD-10-CM | POA: Diagnosis not present

## 2021-02-12 ENCOUNTER — Telehealth: Payer: Self-pay

## 2021-02-19 NOTE — Progress Notes (Signed)
SAAFIR, ABDULLAH MEDIUM RISK 80 years, Male  DOB: Feb 15, 1941  M: [phone]  Section 1: Review Chart  1. What recent interventions/DTPs have been made by any provider to improve the patient's conditions in the last 3 months? -12/27/2020- Dr. Dohmeier(Neurology)- Patient presented for OSA/COPD overlap. Changed Furosemide 40mg  from once daily to twice daily.   2. Any recent hospitalizations or ED visits since last visit with CPP? No  Section 2: Adherence Review  1. Adherence rates for STAR metric medications (medication / day supply / last 2 fill dates) Losartan 100mg  -08/29/20, 90 DS Rosuvastatin 40mg  -05/29/20, 90 DS  2. Adherence rates for medications indicated for disease state being reviewed (medication / day supply / last 2 fill dates) Losartan 100mg  -08/29/20, 90 DS Rosuvastatin 40mg  -05/29/20, 90 DS  3. Does the patient have >5 day gap between last estimated fill dates for any of the above medications? We review for adherence No  Section 3: Disease State Questions  1. Able to connect with the Patient? Yes  1.a Did patient have any problems with their health recently? No  [facility] General Disease State Call  1.b Did patient have any problems with their pharmacy? No  1.c Does patient have any issues or side effects with their medications? No  1.d Additional information to pass to Patient's CPP? No  1.e Anything we can do to help take better care of Patient? No  2. Misc. Response/Information: Going for a sleep study 02/27/21.   Section 4: Pharmacist's Review  1. Adherence gaps identified? No  2. Drug Therapy Problems identified No  3. Assessment Controlled  4. Other notes N/A

## 2021-02-21 ENCOUNTER — Inpatient Hospital Stay: Payer: Medicare Other

## 2021-02-21 ENCOUNTER — Inpatient Hospital Stay: Payer: Medicare Other | Attending: Hematology | Admitting: Hematology

## 2021-02-21 ENCOUNTER — Other Ambulatory Visit: Payer: Self-pay

## 2021-02-21 VITALS — BP 98/57 | HR 69 | Temp 98.4°F | Resp 18 | Ht 67.0 in | Wt 244.6 lb

## 2021-02-21 DIAGNOSIS — D75839 Thrombocytosis, unspecified: Secondary | ICD-10-CM | POA: Insufficient documentation

## 2021-02-21 DIAGNOSIS — D45 Polycythemia vera: Secondary | ICD-10-CM

## 2021-02-21 LAB — CBC WITH DIFFERENTIAL/PLATELET
Abs Immature Granulocytes: 0.07 10*3/uL (ref 0.00–0.07)
Basophils Absolute: 0.1 10*3/uL (ref 0.0–0.1)
Basophils Relative: 1 %
Eosinophils Absolute: 0.1 10*3/uL (ref 0.0–0.5)
Eosinophils Relative: 1 %
HCT: 46.7 % (ref 39.0–52.0)
Hemoglobin: 15.5 g/dL (ref 13.0–17.0)
Immature Granulocytes: 1 %
Lymphocytes Relative: 12 %
Lymphs Abs: 1.2 10*3/uL (ref 0.7–4.0)
MCH: 35.9 pg — ABNORMAL HIGH (ref 26.0–34.0)
MCHC: 33.2 g/dL (ref 30.0–36.0)
MCV: 108.1 fL — ABNORMAL HIGH (ref 80.0–100.0)
Monocytes Absolute: 0.5 10*3/uL (ref 0.1–1.0)
Monocytes Relative: 5 %
Neutro Abs: 7.7 10*3/uL (ref 1.7–7.7)
Neutrophils Relative %: 80 %
Platelets: 258 10*3/uL (ref 150–400)
RBC: 4.32 MIL/uL (ref 4.22–5.81)
RDW: 14.4 % (ref 11.5–15.5)
WBC: 9.6 10*3/uL (ref 4.0–10.5)
nRBC: 0 % (ref 0.0–0.2)

## 2021-02-21 LAB — CMP (CANCER CENTER ONLY)
ALT: 23 U/L (ref 0–44)
AST: 28 U/L (ref 15–41)
Albumin: 4 g/dL (ref 3.5–5.0)
Alkaline Phosphatase: 59 U/L (ref 38–126)
Anion gap: 8 (ref 5–15)
BUN: 21 mg/dL (ref 8–23)
CO2: 28 mmol/L (ref 22–32)
Calcium: 9.3 mg/dL (ref 8.9–10.3)
Chloride: 100 mmol/L (ref 98–111)
Creatinine: 1.22 mg/dL (ref 0.61–1.24)
GFR, Estimated: 60 mL/min — ABNORMAL LOW (ref >60)
Glucose, Bld: 109 mg/dL — ABNORMAL HIGH (ref 70–99)
Potassium: 4.3 mmol/L (ref 3.5–5.1)
Sodium: 136 mmol/L (ref 135–145)
Total Bilirubin: 1.6 mg/dL — ABNORMAL HIGH (ref 0.3–1.2)
Total Protein: 6.6 g/dL (ref 6.5–8.1)

## 2021-02-21 LAB — LACTATE DEHYDROGENASE: LDH: 301 U/L — ABNORMAL HIGH (ref 98–192)

## 2021-02-27 ENCOUNTER — Other Ambulatory Visit: Payer: Self-pay

## 2021-02-27 ENCOUNTER — Ambulatory Visit (INDEPENDENT_AMBULATORY_CARE_PROVIDER_SITE_OTHER): Payer: Medicare Other | Admitting: Neurology

## 2021-02-27 ENCOUNTER — Encounter: Payer: Self-pay | Admitting: Hematology

## 2021-02-27 DIAGNOSIS — Z950 Presence of cardiac pacemaker: Secondary | ICD-10-CM

## 2021-02-27 DIAGNOSIS — Z789 Other specified health status: Secondary | ICD-10-CM

## 2021-02-27 DIAGNOSIS — G4739 Other sleep apnea: Secondary | ICD-10-CM

## 2021-02-27 DIAGNOSIS — G4731 Primary central sleep apnea: Secondary | ICD-10-CM

## 2021-02-27 DIAGNOSIS — J449 Chronic obstructive pulmonary disease, unspecified: Secondary | ICD-10-CM

## 2021-02-27 DIAGNOSIS — D471 Chronic myeloproliferative disease: Secondary | ICD-10-CM

## 2021-02-27 DIAGNOSIS — I2510 Atherosclerotic heart disease of native coronary artery without angina pectoris: Secondary | ICD-10-CM

## 2021-02-27 DIAGNOSIS — G4733 Obstructive sleep apnea (adult) (pediatric): Secondary | ICD-10-CM | POA: Diagnosis not present

## 2021-02-27 DIAGNOSIS — D45 Polycythemia vera: Secondary | ICD-10-CM

## 2021-02-27 NOTE — Progress Notes (Signed)
HEMATOLOGY/ONCOLOGY CLINIC NOTE  Date of Service: .02/21/2021   Patient Care Team: [name], MD as PCP - General (Internal Medicine) Georg Ruddle, Ashok Cordia, MD as Referring Physician (Cardiology) Shirlee More Ronalee Red, MD as Referring Physician (Cardiology) Brunetta Genera, MD as Consulting Physician (Hematology) Rush Landmark, [facility] as Pharmacist (Pharmacist)  CHIEF COMPLAINTS/PURPOSE OF CONSULTATION:  F/u for JAK2 positive MPN  HISTORY OF PRESENTING ILLNESS:   [name] [name] is a wonderful 80 y.o. male who has been referred to [place] by Dr. [name]  for evaluation and management of Thrombocytosis. He is accompanied today by his wife. The pt reports that he is doing well overall.   The pt reports that he has never had any blood clots.  He has atrial fibrillation and has taken Coumadin since 2015. He had heart surgery in 2015, and had his ICD placed in 2016. The pt also takes 81g aspirin daily.   The pt notes that he has not been aware of his high platelets previously, but only as of his most recent labs. He denies any recent major bleeds, surgeries, and other trauma. He denies having a splenectomy at any time as well.   The pt notes that his right kidney gets sore from time to time when he doesn't stay well hydrated. He also notes that when he urinates at these times, his urine is very dark. The pt adds that he had kidney stones one time. The pt is also taking Lasix and notes that he urinates frequently.   The pt adds that his gums have been very sore, even to touch from his tongue.  The pt notes that he does not want to use a CPAP after a bad experience with a previous sleep study that did diagnose him with sleep apnea. He notes that he falls asleep several times a day as well, falling asleep easily when he sits down, and denying falling asleep while driving. He also notes some increased difficulty remembering.   Most recent lab results (12/09/17) of CBC is as follows:  all values are WNL except for WBC at 11.9k, RDW at 18.4, PLT at 649k, ANC at 9.7k.  On review of systems, pt reports good energy levels, falling asleep several times a day, staying active, and denies recent surgery, recent trauma, bleeding, unexpected weight loss, pain along the spine, abdominal pains, and any other symptoms.   On Family Hx the pt reports prostate cancer.   INTERVAL HISTORY:   [name] [name] returns today for management and evaluation of his JAK2 positive MPN. The patient's last visit with [place] was on 07/23/2020. The pt reports that he is doing well overall.  The pt reports he has been doing well but still has issues with memory problems.  Still awaiting his sleep study. Notes no acute new concerns.  No major change in medications. No prohibitive toxicities from his hydroxyurea.  Lab results done today were reviewed with the patient.  MEDICAL HISTORY:  Past Medical History:  Diagnosis Date   A-fib ([place]) 01/2014   AICD (automatic cardioverter/defibrillator) present    [employer]   Anal fissure 11/10/2019   Arthritis    ASHD (arteriosclerotic heart disease) 2015   s/p CABG   Asthma    CAD (coronary artery disease)    Cataract    s/p left   CHF (congestive heart failure) (HCC)    COPD (chronic obstructive pulmonary disease) ([place])    by CXR, pt unsure of this   Diverticulosis  GERD (gastroesophageal reflux disease)    Gout 2014   Hyperlipidemia    Hypertension    Hypothyroidism    LBBB (left bundle branch block)    Polycythemia    Pre-diabetes    Prediabetes 01/2014   Presence of permanent cardiac pacemaker    Sleep apnea    no CPAP   Thyroid disease    hypothyroid    SURGICAL HISTORY: Past Surgical History:  Procedure Laterality Date   CARDIAC DEFIBRILLATOR PLACEMENT  07/2014   CATARACT EXTRACTION W/ INTRAOCULAR LENS IMPLANT Left    CORONARY ARTERY BYPASS GRAFT  11/2013   lumb laminectomy  [id number]   x 3   LUMBAR FUSION  2013   NASAL  SEPTOPLASTY W/ TURBINOPLASTY Bilateral 01/30/2016   Procedure: NASAL SEPTOPLASTY WITH BILATERAL TURBINATE REDUCTION;  Surgeon: Rozetta Nunnery, MD;  Location: [facility];  Service: ENT;  Laterality: Bilateral;   TONSILLECTOMY     UVULECTOMY N/A 01/30/2016   Procedure: Martyn Ehrich;  Surgeon: Rozetta Nunnery, MD;  Location: [facility];  Service: ENT;  Laterality: N/A;    SOCIAL HISTORY: Social History   Socioeconomic History   Marital status: Married    Spouse name: [name]    Number of children: 2   Years of education: 13   Highest education level: Not on file  Occupational History   Occupation: Retired  Tobacco Use   Smoking status: Former    Packs/day: 2.00    Years: 25.00    Pack years: 50.00    Types: Cigarettes    Quit date: 05/20/1983    Years since quitting: 37.8   Smokeless tobacco: Never  Vaping Use   Vaping Use: Never used  Substance and Sexual Activity   Alcohol use: Not Currently    Alcohol/week: 0.0 standard drinks   Drug use: No   Sexual activity: Not on file  Other Topics Concern   Not on file  Social History Narrative   Lives with wife, [name]   Caffeine use: daily (Coffee)   Social Determinants of Health   Financial Resource Strain: Not on file  Food Insecurity: Not on file  Transportation Needs: No Transportation Needs   Lack of Transportation (Medical): No   Lack of Transportation (Non-Medical): No  Physical Activity: Not on file  Stress: Not on file  Social Connections: Not on file  Intimate Partner Violence: Not on file    FAMILY HISTORY: Family History  Problem Relation Age of Onset   Alzheimer's disease Mother    Mental illness Mother    Depression Mother    Heart disease Father 65       had 72 MI's & died 70 yo   Hypertension Father    Alcohol abuse Son    Stroke Maternal Grandmother    Dementia Maternal Grandmother    Heart attack Son     ALLERGIES:  is allergic to other.  MEDICATIONS:  Current Outpatient Medications  Medication Sig  Dispense Refill   acetaminophen (TYLENOL) 500 MG tablet Take 500 mg by mouth as needed for mild pain.     albuterol (PROVENTIL) (2.5 MG/3ML) 0.083% nebulizer solution Use   1 vial   4 x /day   or every 4 hours for Wheezing or Shortness of Breath (Patient taking differently: Use   1 vial   4 x /day   or every 4 hours for Wheezing or Shortness of Breath) 90 mL 0   allopurinol (ZYLOPRIM) 300 MG tablet Take      1 tablet  Daily       to Prevent Gout 90 tablet 3   AMBULATORY NON FORMULARY MEDICATION Nitroglycerine ointment 0.125 % - Apply a pea sized amount to your rectum to the first knuckle three times daily. 30 g 3   aspirin EC 81 MG tablet Take 81 mg by mouth daily.     Cholecalciferol (VITAMIN D PO) Take 10,000 Units by mouth daily.     diphenhydrAMINE (BENADRYL) 25 MG tablet Take 25 mg by mouth at bedtime.     Docosahexaenoic Acid (DHA PO) Take 500 mg by mouth daily. + EPA 250 mg     docusate sodium (COLACE) 100 MG capsule Take 100 mg by mouth 2 (two) times daily. 1 tablet in the morning and 1 tablet at bedtime (stool softener)     doxazosin (CARDURA) 4 MG tablet TAKE 1 TABLET EVERY DAY 90 tablet 3   finasteride (PROSCAR) 5 MG tablet TAKE 1 TABLET EVERY DAY FOR PROSTATE 90 tablet 3   furosemide (LASIX) 40 MG tablet Take 40 mg by mouth 2 (two) times daily.     hydroxyurea (HYDREA) 500 MG capsule TAKE 2 CAPSULES ONE TIME DAILY WITH FOOD 180 capsule 0   ipratropium (ATROVENT) 0.06 % nasal spray Use 1 to 2 sprays each Nostril 3 x /day 45 mL 3   isosorbide mononitrate (IMDUR) 30 MG 24 hr tablet Take by mouth daily.     levothyroxine (SYNTHROID) 200 MCG tablet TAKE 1 TAB DAILY ON EMPTY STOMACH WITH ONLY WATER FOR 30 MINS. NO ANTACIDS, CALCIUM OR MAGNESIUM FOR 4 HOURS. AVOID BIOTIN. 90 tablet 3   losartan (COZAAR) 100 MG tablet Take 1 tablet (100 mg total) by mouth daily. 90 tablet 3   magnesium oxide (MAG-OX) 400 MG tablet Take by mouth.     MELATONIN PO Take 1 tablet by mouth as needed.      metoprolol (TOPROL-XL) 200 MG 24 hr tablet Take 200 mg by mouth daily.     Multiple Vitamin (MULTIVITAMIN) tablet Take 1 tablet by mouth daily. A-Z     omeprazole (PRILOSEC) 20 MG capsule TAKE 1 CAPSULE TWICE DAILY  ( EVERY 12 HOURS  ) TO PREVENT HEARTBURN AND ACID REFLUX 180 capsule 3   OVER THE COUNTER MEDICATION OTC Biotin daily.     polyethylene glycol powder (GLYCOLAX/MIRALAX) 17 GM/SCOOP powder Take 17 g by mouth daily.     Probiotic Product (PROBIOTIC-10 PO) Take by mouth daily.     rosuvastatin (CRESTOR) 40 MG tablet TAKE 1 TABLET EVERY DAY FOR CHOLESTEROL 90 tablet 3   spironolactone (ALDACTONE) 25 MG tablet TAKE 1 TABLET EVERY DAY FOR BLOOD PRESSURE AND HEART FAILURE 90 tablet 3   TURMERIC PO Take 1,000 mg by mouth daily.     warfarin (COUMADIN) 5 MG tablet Take 1 to 2 tablets Daily as Directed (Patient taking differently: Take 1 to 2 tablets Daily as Directed (Taking 1.5 on Mondays)) 145 tablet 0   zinc gluconate 50 MG tablet Take 50 mg by mouth daily.     No current facility-administered medications for this visit.    REVIEW OF SYSTEMS:   .10 Point review of Systems was done is negative except as noted above.  PHYSICAL EXAMINATION: Vitals:   02/21/21 1359  BP: (!) 98/57  Pulse: 69  Resp: 18  Temp: 98.4 F (36.9 C)  SpO2: 97%    Filed Weights   02/21/21 1359  Weight: 244 lb 9.6 oz (110.9 kg)    .Body mass index is 38.[place]  kg/m.   .BP (!) 98/57 (BP Location: Right Arm, Patient Position: Sitting)   Pulse 69   Temp 98.4 F (36.9 C) (Oral)   Resp 18   Ht 5\' 7"  (1.702 m)   Wt 244 lb 9.6 oz (110.9 kg)   SpO2 97%   BMI 38.31 kg/m  . GENERAL:alert, in no acute distress and comfortable SKIN: no acute rashes, no significant lesions EYES: conjunctiva are pink and non-injected, sclera anicteric OROPHARYNX: MMM, no exudates, no oropharyngeal erythema or ulceration NECK: supple, no JVD LYMPH:  no palpable lymphadenopathy in the cervical, axillary or inguinal  regions LUNGS: clear to auscultation b/l with normal respiratory effort HEART: regular rate & rhythm ABDOMEN:  normoactive bowel sounds , non tender, not distended. Extremity: no pedal edema PSYCH: alert & oriented x 3 with fluent speech NEURO: no focal motor/sensory deficits   LABORATORY DATA:  I have reviewed the data as listed  . CBC Latest Ref Rng & Units 02/21/2021 11/29/2020 11/14/2020  WBC 4.0 - 10.5 K/uL 9.6 12.4(H) 11.0(H)  Hemoglobin 13.0 - 17.0 g/dL 15.5 16.4 15.5  Hematocrit 39.0 - 52.0 % 46.7 47.2 48.1  Platelets 150 - 400 K/uL 258 351 294    . CMP Latest Ref Rng & Units 02/21/2021 11/29/2020 11/14/2020  Glucose 70 - 99 mg/dL 109(H) 112(H) 116(H)  BUN 8 - 23 mg/dL 21 23 27(H)  Creatinine 0.61 - 1.24 mg/dL 1.22 1.09 1.28(H)  Sodium 135 - 145 mmol/L 136 135 136  Potassium 3.5 - 5.1 mmol/L 4.3 5.2 4.7  Chloride 98 - 111 mmol/L 100 99 102  CO2 22 - 32 mmol/L 28 28 27   Calcium 8.9 - 10.3 mg/dL 9.3 9.7 9.2  Total Protein 6.5 - 8.1 g/dL 6.6 6.6 6.5  Total Bilirubin 0.3 - 1.2 mg/dL 1.6(H) 1.2 1.5(H)  Alkaline Phos 38 - 126 U/L 59 - 55  AST 15 - 41 U/L 28 26 26   ALT 0 - 44 U/L 23 25 20     02/09/18 JAK2 Mutation Study:    02/09/18 BCR ABL FISH:    RADIOGRAPHIC STUDIES: I have personally reviewed the radiological images as listed and agreed with the findings in the report. No results found.  ASSESSMENT & PLAN:   80 y.o. male with  1. 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 Some associated leukocytosis. No history of splenectomy. No previous h/o VTE, but significant cardiac risk factors. 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   02/09/18 BCR ABL FISH revealed no evidence of BCR/ABL  rearrangement   PLAN: -Discussed pt labwork, 02/21/2021 HCT stable, Plt normal, chemistries stable. -Advised pt we will continue with same dosage of Hydroxyurea at this time. -The pt has no prohibitive toxicities from continuing 1000 mg Hydroxyurea 7 days/week at this time. -No need for phlebotomy at this time. -Goal for PLT between 200K and 400K - PLT currently at goal at 294K. -Continue daily baby Aspirin  -Will see back in 3 months with labs.  2. . Patient Active Problem List   Diagnosis Date Noted   MPN (myeloproliferative neoplasm) ([place]) 08/27/2020   JAK2 V617F mutation 08/27/2020   Rectal pain 11/10/2019   Constipation 11/10/2019   Abnormal glucose 10/12/2019   Memory changes 01/10/2019   Polycythemia vera ([place]) 10/21/2018  Thrombocytosis 12/29/2017   Senile purpura ([place]) 09/01/2017   Emphysema of lung ([place]) 06/01/2017   Tortuous aorta ([place]) 06/01/2017   ASHD/CABG x 3V (11/2013) 02/06/2015   Acquired thrombophilia ([place]) 02/06/2015   Cardiac pacemaker/AICD (01/2014) 02/06/2015   Morbid obesity ([place]) 01/03/2015   HTN (hypertension) 01/02/2015   Hyperlipidemia, mixed 01/02/2015   Vitamin D deficiency 01/02/2015   GERD  01/02/2015   BPH (benign prostatic hyperplasia) 01/02/2015   Medication management 01/02/2015   Atrial fibrillation ([place]) 01/02/2015   Hypothyroidism 01/02/2015   OSA and COPD overlap syndrome ([place]) 01/02/2015  -continue f/u with PCP to optimize atherosclerotic risk factors.    FOLLOW UP: RTC with Dr Irene Limbo with labs in 3 months  . The total time spent in the appointment was 20 minutes and more than 50% was on counseling and direct patient cares.  All of the patient's questions were answered with apparent satisfaction. The patient knows to call the clinic with any problems, questions or concerns.   Sullivan Lone MD [place] AAHIVMS [facility] Hematology/Oncology Physician [facility]

## 2021-03-06 DIAGNOSIS — H16223 Keratoconjunctivitis sicca, not specified as Sjogren's, bilateral: Secondary | ICD-10-CM | POA: Diagnosis not present

## 2021-03-06 DIAGNOSIS — H52223 Regular astigmatism, bilateral: Secondary | ICD-10-CM | POA: Diagnosis not present

## 2021-03-06 DIAGNOSIS — Z961 Presence of intraocular lens: Secondary | ICD-10-CM | POA: Diagnosis not present

## 2021-03-08 ENCOUNTER — Ambulatory Visit (INDEPENDENT_AMBULATORY_CARE_PROVIDER_SITE_OTHER): Payer: Medicare Other | Admitting: Adult Health

## 2021-03-08 ENCOUNTER — Encounter: Payer: Self-pay | Admitting: Adult Health

## 2021-03-08 ENCOUNTER — Other Ambulatory Visit: Payer: Self-pay

## 2021-03-08 VITALS — BP 110/70 | HR 70 | Temp 97.7°F | Wt 246.0 lb

## 2021-03-08 DIAGNOSIS — D45 Polycythemia vera: Secondary | ICD-10-CM

## 2021-03-08 DIAGNOSIS — I10 Essential (primary) hypertension: Secondary | ICD-10-CM

## 2021-03-08 DIAGNOSIS — R7309 Other abnormal glucose: Secondary | ICD-10-CM

## 2021-03-08 DIAGNOSIS — R6889 Other general symptoms and signs: Secondary | ICD-10-CM | POA: Diagnosis not present

## 2021-03-08 DIAGNOSIS — D6869 Other thrombophilia: Secondary | ICD-10-CM

## 2021-03-08 DIAGNOSIS — I480 Paroxysmal atrial fibrillation: Secondary | ICD-10-CM | POA: Diagnosis not present

## 2021-03-08 DIAGNOSIS — I2510 Atherosclerotic heart disease of native coronary artery without angina pectoris: Secondary | ICD-10-CM | POA: Diagnosis not present

## 2021-03-08 DIAGNOSIS — E039 Hypothyroidism, unspecified: Secondary | ICD-10-CM

## 2021-03-08 DIAGNOSIS — J449 Chronic obstructive pulmonary disease, unspecified: Secondary | ICD-10-CM

## 2021-03-08 DIAGNOSIS — D692 Other nonthrombocytopenic purpura: Secondary | ICD-10-CM | POA: Diagnosis not present

## 2021-03-08 DIAGNOSIS — Z0000 Encounter for general adult medical examination without abnormal findings: Secondary | ICD-10-CM

## 2021-03-08 DIAGNOSIS — I771 Stricture of artery: Secondary | ICD-10-CM

## 2021-03-08 DIAGNOSIS — Z23 Encounter for immunization: Secondary | ICD-10-CM | POA: Diagnosis not present

## 2021-03-08 DIAGNOSIS — Z79899 Other long term (current) drug therapy: Secondary | ICD-10-CM | POA: Diagnosis not present

## 2021-03-08 DIAGNOSIS — J439 Emphysema, unspecified: Secondary | ICD-10-CM | POA: Diagnosis not present

## 2021-03-08 DIAGNOSIS — G4733 Obstructive sleep apnea (adult) (pediatric): Secondary | ICD-10-CM | POA: Diagnosis not present

## 2021-03-08 DIAGNOSIS — Z0001 Encounter for general adult medical examination with abnormal findings: Secondary | ICD-10-CM

## 2021-03-08 DIAGNOSIS — D471 Chronic myeloproliferative disease: Secondary | ICD-10-CM

## 2021-03-08 DIAGNOSIS — E782 Mixed hyperlipidemia: Secondary | ICD-10-CM

## 2021-03-08 DIAGNOSIS — K219 Gastro-esophageal reflux disease without esophagitis: Secondary | ICD-10-CM | POA: Diagnosis not present

## 2021-03-08 DIAGNOSIS — Z950 Presence of cardiac pacemaker: Secondary | ICD-10-CM

## 2021-03-08 DIAGNOSIS — N401 Enlarged prostate with lower urinary tract symptoms: Secondary | ICD-10-CM

## 2021-03-08 DIAGNOSIS — R413 Other amnesia: Secondary | ICD-10-CM

## 2021-03-08 DIAGNOSIS — E559 Vitamin D deficiency, unspecified: Secondary | ICD-10-CM

## 2021-03-08 NOTE — Progress Notes (Signed)
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   Senile purpura (HCC) Discussed process, protect skin, sunscreen  OSA and COPD overlap syndrome (HCC) Didn't tolerate CPAP, symptoms improved with sinus surgery, weight loss, sleeping in recliner Dr. Brett Fairy now following weight loss still advised.  Rare albuterol  Other hemorrhoids Continue bowel regimen; recently without flares  Essential hypertension At goal; continue medications Monitor blood pressure at home; call if consistently over 130/80 Continue DASH diet.   Reminder to go to the ER if any CP, SOB, nausea, dizziness, severe HA, changes vision/speech, left arm numbness and tingling and jaw pain.  Paroxysmal atrial fibrillation ([place]) Continue follow up with a fib clinic for coumadin management Discussed if patient falls to immediately contact office or go to ER. Discussed foods that can increase or decrease Coumadin levels. Patient understands to call the office before starting a new medication.  Acquired thrombophilia ([place]) A. Fib and hematological conditions; treated by coumadin - followed by a. Fib clinic.   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 Followed by cardiology    Gastroesophageal reflux disease, esophagitis presence not specified Well managed on current medications Discussed diet, avoiding triggers and other lifestyle changes  Hypothyroidism, unspecified type continue medications the same pending labs reminded to take on an empty stomach 30-67mins before food, 4+ hours apart from reflux meds -     TSH  Benign prostatic hyperplasia with lower urinary tract symptoms, symptom details unspecified Continue medications; symptoms well controlled Check annual  PSA; urology referral as indicated  Mixed hyperlipidemia At goal for LDL; continue statin- discussed diet for borderline trigs Continue low cholesterol diet and exercise.  -     Lipid panel  Abnormal glucose Discussed disease and risks of elevated glucose Discussed diet/exercise, weight management  -     Hemoglobin A1c - defer, last at goal   Vitamin D deficiency At goal; continue supplementation Defer vitamin D level  Medication management -     CBC with Differential/Platelet -     CMP/GFR -     Magnesium  Morbid obesity ([place])- BMI 38 Long discussion about weight loss, diet, and exercise Recommended diet heavy in fruits and veggies and low in animal meats, cheeses, and dairy products, appropriate calorie intake Discussed appropriate weight for height, goal of <250lb set, increase regular exercise Follow up in 3 months  Cardiac pacemaker/AICD (01/2014) Continue follow up with cardiology  Polycythemia Vera (HCC)/ MPN (HCC)/ Thrombocytosis Followed by Dr. Irene Limbo, on hydroxyurea 500 mg  Monitor CBC  Need for influenza vaccine High dose quadrivalent administered without complication today    Over 30 minutes of exam, counseling, chart review, and critical decision making was performed  Future Appointments  Date Time Provider [place]  03/26/2021 11:00 AM Rush Landmark, RPH [facility] None  05/24/2021 10:45 AM [facility] None  05/24/2021 11:20 AM Brunetta Genera, MD [facility] None  05/28/2021 10:00 AM [name], MD [facility] None  03/11/2022 10:00 AM [name], NP [facility] None     Plan:   During the course of the visit the patient was educated and counseled about appropriate screening and preventive services including:   Pneumococcal vaccine  Influenza vaccine Prevnar 13 Td vaccine Screening electrocardiogram Colorectal cancer screening Diabetes screening Glaucoma screening Nutrition counseling    Subjective:  [name] [name] is a 80 y.o. male who presents for Medicare Annual Wellness Visit and 3 month follow up for HTN, hyperlipidemia, prediabetes, and vitamin D Def.    In 2015, he underwent a CABG x 3 V w/po Afib- and failed 2 attempts at VCV. Then in 07/2014, he had a BiVent, AICD/Defib/PM planted. He is followed at his Cardiologist (Dr. Georg Ruddle) coumadin clinic.  Patient denies any cardiac symptoms as chest pain, palpitations, shortness of breath, dizziness or ankle swelling. He has dx of sleep apnea but did not tolerate CPAP 7 years ago. He has been concerned due to slowing of memory, having more difficulty recalling names, doing well managing meds with pill boxes.  Recently established with Dr. Brett Fairy and pending follow up after recent repeat sleep study.  Patient has Polycythemia Vera, myeloproliferative neoplasm, followed by Dr.  Irene Limbo at [facility] and treated by hydroxyurea and periodic therapeutic phlebotomies.    he has a diagnosis of GERD which is currently managed by prilosec 20 mg once daily prior to dinner, well controlled.   He has history of hemorrhoids, worse with constipation, will have blood with every BM. No pain with wiping. Last colonoscopy 2008, cologuard neg 2019. He notes in recent years has noted narrowing of stools in the last year. He takes colace BID to help prevent constipation.   BMI is Body mass index is 38.53 kg/m., he admits hasn't been intentionally exercising, but does some house projects, works in yard trimming trees. He does occasionally use treadmill, step machine, rower at home. Has noted less of an appetite in recent years and eating smaller portions.  Wt Readings from Last 3 Encounters:  03/08/21 246 lb (111.6 kg)  02/21/21 244 lb 9.6 oz (110.9 kg)  12/27/20 245 lb (111.1 kg)   His blood pressure has been controlled at home, today their BP is BP: 110/70 He does workout. He denies chest pain, shortness of breath, dizziness.   He is on cholesterol  medication (atorvastatin 40 mg daily) and denies myalgias. His LDL cholesterol is at goal. The cholesterol last visit was:   Lab Results  Component Value Date   CHOL 122 11/29/2020   HDL 22 (L) 11/29/2020   LDLCALC 75 11/29/2020   TRIG 148 11/29/2020   CHOLHDL 5.5 (H) 11/29/2020   He has been working on diet and exercise for hx of prediabetes, and denies increased appetite, nausea, paresthesia of the feet, polydipsia, polyuria, visual disturbances, vomiting and weight loss. Last A1C in the office was:  Lab Results  Component Value Date   HGBA1C 5.3 11/29/2020   He is on thyroid medication. His medication was not changed last visit. He is taking levothyroxine 200 mcg daily, has been taking 30 min prior to other food or meds since last vist.  Lab Results  Component Value Date   TSH 0.33 (L) 11/29/2020   Last GFR Lab Results  Component Value Date   GFRNONAA 60 (L) 02/21/2021   Patient is on Vitamin D supplement and at goal at recent check:    Lab Results  Component Value Date   VD25OH 82 11/29/2020     Patient is on allopurinol for gout and does not report a recent flare.  Lab Results  Component Value Date   LABURIC 4.9 11/29/2020     Medication Review:  Current Outpatient Medications (Endocrine & Metabolic):    levothyroxine (SYNTHROID) 200 MCG tablet, TAKE 1 TAB DAILY ON EMPTY STOMACH WITH ONLY WATER FOR 30 MINS. NO ANTACIDS, CALCIUM  OR MAGNESIUM FOR 4 HOURS. AVOID BIOTIN.  Current Outpatient Medications (Cardiovascular):    doxazosin (CARDURA) 4 MG tablet, TAKE 1 TABLET EVERY DAY   furosemide (LASIX) 40 MG tablet, Take 40 mg by mouth 2 (two) times daily.   isosorbide mononitrate (IMDUR) 30 MG 24 hr tablet, Take by mouth daily.   losartan (COZAAR) 100 MG tablet, Take 1 tablet (100 mg total) by mouth daily.   metoprolol (TOPROL-XL) 200 MG 24 hr tablet, Take 200 mg by mouth daily.   rosuvastatin (CRESTOR) 40 MG tablet, TAKE 1 TABLET EVERY DAY FOR CHOLESTEROL    spironolactone (ALDACTONE) 25 MG tablet, TAKE 1 TABLET EVERY DAY FOR BLOOD PRESSURE AND HEART FAILURE  Current Outpatient Medications (Respiratory):    albuterol (PROVENTIL) (2.5 MG/3ML) 0.083% nebulizer solution, Use   1 vial   4 x /day   or every 4 hours for Wheezing or Shortness of Breath (Patient taking differently: Use   1 vial   4 x /day   or every 4 hours for Wheezing or Shortness of Breath)   diphenhydrAMINE (BENADRYL) 25 MG tablet, Take 25 mg by mouth at bedtime.   ipratropium (ATROVENT) 0.06 % nasal spray, Use 1 to 2 sprays each Nostril 3 x /day  Current Outpatient Medications (Analgesics):    acetaminophen (TYLENOL) 500 MG tablet, Take 500 mg by mouth as needed for mild pain.   allopurinol (ZYLOPRIM) 300 MG tablet, Take      1 tablet      Daily       to Prevent Gout   aspirin EC 81 MG tablet, Take 81 mg by mouth daily.  Current Outpatient Medications (Hematological):    warfarin (COUMADIN) 5 MG tablet, Take 1 to 2 tablets Daily as Directed (Patient taking differently: Take 1 to 2 tablets Daily as Directed (Taking 1.5 on Mondays))  Current Outpatient Medications (Other):    AMBULATORY NON FORMULARY MEDICATION, Nitroglycerine ointment 0.125 % - Apply a pea sized amount to your rectum to the first knuckle three times daily.   Cholecalciferol (VITAMIN D PO), Take 10,000 Units by mouth daily.   Docosahexaenoic Acid (DHA PO), Take 500 mg by mouth daily. + EPA 250 mg   docusate sodium (COLACE) 100 MG capsule, Take 100 mg by mouth 2 (two) times daily. 1 tablet in the morning and 1 tablet at bedtime (stool softener)   finasteride (PROSCAR) 5 MG tablet, TAKE 1 TABLET EVERY DAY FOR PROSTATE   hydroxyurea (HYDREA) 500 MG capsule, TAKE 2 CAPSULES ONE TIME DAILY WITH FOOD   magnesium oxide (MAG-OX) 400 MG tablet, Take by mouth.   MELATONIN PO, Take 1 tablet by mouth as needed.   Multiple Vitamin (MULTIVITAMIN) tablet, Take 1 tablet by mouth daily. A-Z   omeprazole (PRILOSEC) 20 MG capsule, TAKE  1 CAPSULE TWICE DAILY  ( EVERY 12 HOURS  ) TO PREVENT HEARTBURN AND ACID REFLUX   OVER THE COUNTER MEDICATION, OTC Biotin daily.   polyethylene glycol powder (GLYCOLAX/MIRALAX) 17 GM/SCOOP powder, Take 17 g by mouth daily.   Probiotic Product (PROBIOTIC-10 PO), Take by mouth daily.   TURMERIC PO, Take 1,000 mg by mouth daily.   zinc gluconate 50 MG tablet, Take 50 mg by mouth daily.  Allergies: Allergies  Allergen Reactions   Other Swelling    SODIUM SENSITIVE    Current Problems (verified) has HTN (hypertension); Hyperlipidemia, mixed; Vitamin D deficiency; GERD ; BPH (benign prostatic hyperplasia); Medication management; Atrial fibrillation ([place]); Hypothyroidism; OSA and COPD overlap syndrome ([place]); Morbid obesity (  [place]); ASHD/CABG x 3V (11/2013); Acquired thrombophilia ([place]); Cardiac pacemaker/AICD (01/2014); Emphysema of lung ([place]); Tortuous aorta ([place])- CXR 2018; Senile purpura ([place]); Thrombocytosis; Polycythemia vera ([place]); Memory changes; Abnormal glucose; Rectal pain; Constipation; MPN (myeloproliferative neoplasm) ([place]); and JAK2 [id number] mutation on their problem list.  Screening Tests Immunization History  Administered Date(s) Administered   Influenza, High Dose Seasonal PF 04/23/2015, 02/18/2017, 03/29/2018, 02/12/2019   Influenza,inj,Quad PF,6+ Mos 01/31/2016   Moderna SARS-COV2 Booster Vaccination 03/14/2020, 10/11/2020   Moderna Sars-Covid-2 Vaccination 06/18/2019, 07/18/2019   Pneumococcal Conjugate-13 11/21/2014   Pneumococcal Polysaccharide-23 06/01/2017   Preventative care: Last colonoscopy: 2008  Cologuard: 06/19/2020 negative  ECHO: 2018 CXR: 05/2017 - emphysematous changes, 50 pack year smoking history, not a candidate for low dose CT, quit greater than 15 years ago  Prior vaccinations: TD or Tdap: Remote;  declines Influenza: 2021, TODAY  Pneumococcal: 2019 Prevnar13: 2016 Shingles/Zostavax: Zostavax can't recall when Covid 19: 2/2, + booster x 2, planning to get  booster in [place]  Names of Other Physician/Practitioners you currently use: 1. [facility] here for primary care 2. [facility], eye doctor, last visit 2022 3. [facility] Pick, dentist, last visit 2022  Patient Care Team: [name], MD as PCP - General (Internal Medicine) Georg Ruddle, Ashok Cordia, MD as Referring Physician (Cardiology) Shirlee More Ronalee Red, MD as Referring Physician (Cardiology) Brunetta Genera, MD as Consulting Physician (Hematology) Rush Landmark, [facility] as Pharmacist (Pharmacist)  Surgical: He  has a past surgical history that includes Coronary artery bypass graft (11/2013); lumb laminectomy [phone]); Lumbar fusion (2013); Cardiac defibrillator placement (07/2014); Cataract extraction w/ intraocular lens implant (Left); Nasal septoplasty w/ turbinoplasty (Bilateral, 01/30/2016); Uvulectomy (N/A, 01/30/2016); and Tonsillectomy. Family His family history includes Alcohol abuse in his son; Alzheimer's disease in his mother; Dementia in his maternal grandmother; Depression in his mother; Heart attack in his son; Heart disease (age of onset: 65) in his father; Hypertension in his father; Mental illness in his mother; Stroke in his maternal grandmother. Social history  He reports that he quit smoking about 37 years ago. His smoking use included cigarettes. He has a 50.00 pack-year smoking history. He has never used smokeless tobacco. He reports that he does not currently use alcohol. He reports that he does not use drugs.  MEDICARE WELLNESS OBJECTIVES: Physical activity: Current Exercise Habits: Home exercise routine, Type of exercise: treadmill;strength training/weights;stretching, Time (Minutes): 10, Frequency (Times/Week): 5, Weekly Exercise (Minutes/Week): 50, Intensity: Mild, Exercise limited by: orthopedic condition(s) Cardiac risk factors: Cardiac Risk Factors include: advanced age (>69men, >23  women);dyslipidemia;hypertension;male gender;obesity (BMI >30kg/m2);sedentary lifestyle Depression/mood screen:   Depression screen [facility] 2/9 12/02/2020  Decreased Interest 0  Down, Depressed, Hopeless 0  PHQ - 2 Score 0    ADLs:  In your present state of health, do you have any difficulty performing the following activities: 03/08/2021 12/02/2020  Hearing? N N  Vision? N N  Difficulty concentrating or making decisions? Y N  Comment slow name recall, no other concerns -  Walking or climbing stairs? N N  Dressing or bathing? N N  Doing errands, shopping? N N  Some recent data might be hidden     Cognitive Testing  Alert? Yes  Normal Appearance?Yes  Oriented to person? Yes  Place? Yes   Time? Yes  Recall of three objects?  2/3   Can perform simple calculations? Yes  Displays appropriate judgment?Yes  Can read the correct time from a watch face?Yes  EOL planning: Does Patient Have a  Medical Advance Directive?: No Would patient like information on creating a medical advance directive?: Yes (MAU/Ambulatory/Procedural Areas - Information given) Yes  Objective:   Today's Vitals   03/08/21 1050  BP: 110/70  Pulse: 70  Temp: 97.7 F (36.5 C)  SpO2: 99%  Weight: 246 lb (111.6 kg)    Body mass index is 38.53 kg/m.  General appearance: alert, obese, no distress, WD/WN, male HEENT: normocephalic, sclerae anicteric, TMs pearly, nares patent, no discharge or erythema, pharynx normal Oral cavity: MMM, no lesions Neck: supple, no lymphadenopathy, no thyromegaly, no masses Heart: RRR, normal S1, S2, mild 2/6 systolic murmur Lungs: Present throughout, symmetrical, No rhonchi, or rales or wheezing Abdomen: +bs, soft, non tender, non distended, no masses, no hepatomegaly, no splenomegaly Musculoskeletal: nontender, no swelling, no obvious deformity Extremities: no edema, no cyanosis, no clubbing Pulses: 2+ symmetric, upper and lower extremities, normal cap refill Neurological: alert,  oriented x 3, CN2-12 intact, strength normal upper extremities and lower extremities, sensation normal throughout, DTRs 2+ throughout, no cerebellar signs, gait normal. Some slow recall.  Psychiatric: normal affect, behavior normal, pleasant   Medicare Attestation I have personally reviewed: The patient's medical and social history Their use of alcohol, tobacco or illicit drugs Their current medications and supplements The patient's functional ability including ADLs,fall risks, home safety risks, cognitive, and hearing and visual impairment Diet and physical activities Evidence for depression or mood disorders  The patient's weight, height, BMI, and visual acuity have been recorded in the chart.  I have made referrals, counseling, and provided education to the patient based on review of the above and I have provided the patient with a written personalized care plan for preventive services.     Izora Ribas, NP   03/08/2021

## 2021-03-09 LAB — COMPLETE METABOLIC PANEL WITH GFR
AG Ratio: 1.8 (calc) (ref 1.0–2.5)
ALT: 21 U/L (ref 9–46)
AST: 25 U/L (ref 10–35)
Albumin: 4.2 g/dL (ref 3.6–5.1)
Alkaline phosphatase (APISO): 61 U/L (ref 35–144)
BUN: 20 mg/dL (ref 7–25)
CO2: 29 mmol/L (ref 20–32)
Calcium: 9.6 mg/dL (ref 8.6–10.3)
Chloride: 97 mmol/L — ABNORMAL LOW (ref 98–110)
Creat: 1.13 mg/dL (ref 0.70–1.22)
Globulin: 2.3 g/dL (calc) (ref 1.9–3.7)
Glucose, Bld: 95 mg/dL (ref 65–99)
Potassium: 5.2 mmol/L (ref 3.5–5.3)
Sodium: 134 mmol/L — ABNORMAL LOW (ref 135–146)
Total Bilirubin: 1.3 mg/dL — ABNORMAL HIGH (ref 0.2–1.2)
Total Protein: 6.5 g/dL (ref 6.1–8.1)
eGFR: 66 mL/min/{1.73_m2} (ref >=60)

## 2021-03-09 LAB — CBC WITH DIFFERENTIAL/PLATELET
Absolute Monocytes: 467 cells/uL (ref 200–950)
Basophils Absolute: 74 cells/uL (ref 0–200)
Basophils Relative: 0.6 %
Eosinophils Absolute: 74 cells/uL (ref 15–500)
Eosinophils Relative: 0.6 %
HCT: 45.8 % (ref 38.5–50.0)
Hemoglobin: 16.1 g/dL (ref 13.2–17.1)
Lymphs Abs: 1230 cells/uL (ref 850–3900)
MCH: 36.7 pg — ABNORMAL HIGH (ref 27.0–33.0)
MCHC: 35.2 g/dL (ref 32.0–36.0)
MCV: 104.3 fL — ABNORMAL HIGH (ref 80.0–100.0)
MPV: 10.4 fL (ref 7.5–12.5)
Monocytes Relative: 3.8 %
Neutro Abs: 10455 cells/uL — ABNORMAL HIGH (ref 1500–7800)
Neutrophils Relative %: 85 %
Platelets: 344 10*3/uL (ref 140–400)
RBC: 4.39 10*6/uL (ref 4.20–5.80)
RDW: 13.8 % (ref 11.0–15.0)
Total Lymphocyte: 10 %
WBC: 12.3 10*3/uL — ABNORMAL HIGH (ref 3.8–10.8)

## 2021-03-09 LAB — LIPID PANEL
Cholesterol: 123 mg/dL (ref <200)
HDL: 21 mg/dL — ABNORMAL LOW (ref >=40)
LDL Cholesterol (Calc): 80 mg/dL (calc)
Non-HDL Cholesterol (Calc): 102 mg/dL (calc) (ref <130)
Total CHOL/HDL Ratio: 5.9 (calc) — ABNORMAL HIGH (ref <5.0)
Triglycerides: 119 mg/dL (ref <150)

## 2021-03-09 LAB — MAGNESIUM: Magnesium: 1.9 mg/dL (ref 1.5–2.5)

## 2021-03-09 LAB — TSH: TSH: 1.14 mIU/L (ref 0.40–4.50)

## 2021-03-12 MED ORDER — ALPRAZOLAM 0.25 MG PO TABS: 0.2500 mg | ORAL_TABLET | Freq: Every evening | ORAL | 0 refills | Status: DC | PRN

## 2021-03-12 NOTE — Addendum Note (Signed)
Addended by: Larey Seat on: 03/12/2021 12:44 PM   Modules accepted: Orders

## 2021-03-12 NOTE — Progress Notes (Signed)
TITRATION STUDY WITH CPAP RESULTS: SPLIT initiated for this patient with AHI of 42/h .  Complex apnea syndrome with prolonged hypoxia in sleep.  POLYSOMNOGRAPHY IMPRESSION:   The patient's sleep perception as different from the recorded sleep time. He felt he hadn't slept yet at 23.00 hours after sleeping for 30 minutes.   1. Complex Sleep Apnea(CSA) did not respond well to any CPAP pressure between 5 and 12 cm water- AHI remained in the double digits. The patient did d/c the CPAP titration at 3.57 AM.  2. REM sleep rebounded under 10 and 11 cm pressure but was still associated with hypoxia. NREM sleep hypoxia was controlled.  3. Strong positional(supine) influence on AHI. AHI not REM sleep dependent.   The technologist  fitted this patient with a Large Simplus full face mask.   RECOMMENDATIONS: Return for BiPAP / ASV titration. Patient has been intolerant of CPAP before, please try elevated bed, I will provide a sleep aid.  Can we try a dream wear interface , please?  May need oxygen if hypoxia remains significant.

## 2021-03-12 NOTE — Procedures (Signed)
PATIENT'S NAME:  [name] [name], [name] [name] DOB:      1941-01-23      MR#:    [id number]     DATE OF RECORDING: 02/27/2021 REFERRING M.D.:  [name], NP Study Performed:  Split-Night Titration Study HISTORY:  [name] [name] is an 80 year-old Caucasian male patient who was seen upon referral on 12/27/2020  for a Sleep Consultation .  Chief concern:  " please repeat sleep study. Prior SS done in [place] about 7 years ago, patient was unable to complete study. Pt states his mouth drops open and is having a hard time sleeping at night. Pt states he has noticed some memory concerns and his poor sleep may have been affecting his memory". [name] [name] reports his sleep being very fragmented, mainly due to nocturia.  He has a habit of going to bed late but he has been getting 'enough sleep' as he sleeps into the morning.  Usually his sleep is restorative and refreshing.  He wakes up with a dry mouth - he is a mouth breather and does have rhinitis and at night the nasal passages often swell.  He had already undergone a nasal septal repair surgery but it has not completely restored nasal patency especially his right nasal passage is narrow.  He has erythrocythemia.  He feels affected in his memory, had problems with history, remembering dates, words, events.   He has bilateral ankle edema and has a very extensive cardiac history which I have recalculated in the first part of this visit.  Suspected to have a mix of central and obstructive sleep apnea given his cardiac history of congestive heart failure, hypotension at night, elevated BMI, high-grade Mallampati, irregular heartbeats ongoing.   Defibrillator/ pacemaker, COPD/ emphysema/ morning confusion.   The patient endorsed the Epworth Sleepiness Scale at 2 points    The patient's weight 245 pounds with a height of 67 (inches), resulting in a BMI of 38.4 kg/m2.  The patient's neck circumference measured 16 inches.  CURRENT MEDICATIONS: Tylenol,  Proventil, Zyloprim, Aspirin, Vitamin D, Benadryl, DHA, Colace, Cardura, Proscar, Lasix, Hydrea, Atrovent, Imdur, Synthroid, Cozaar, Magg-Ox, Melatonin, Toprol-XL, Multivitamin, Prilosec, Glycolax, Probiotic, Crestor, Aldactone    PROCEDURE:  This is a multichannel digital polysomnogram utilizing the Somnostar 11.2 system.  Electrodes and sensors were applied and monitored per AASM Specifications.   EEG, EOG, Chin and Limb EMG, were sampled at 200 Hz.  ECG, Snore and Nasal Pressure, Thermal Airflow, Respiratory Effort, CPAP Flow and Pressure, Oximetry was sampled at 50 Hz. Digital video and audio were recorded.      BASELINE STUDY WITHOUT CPAP RESULTS: Lights Out was at 21:45 and Lights On at 04:01.  Total recording time (TRT) was 195, with a total sleep time (TST) of 122 minutes.   The patient's sleep latency was 45.5 minutes.  No REM sleep seen.  The sleep efficiency was 62.6 %.    SLEEP ARCHITECTURE: WASO (Wake after sleep onset) was 41.5 minutes, Stage N1 was 41 minutes, Stage N2 was 60 minutes, Stage N3 was 21 minutes and Stage R (REM sleep) was 0 minutes. The percentages were Stage N1 33.6%, Stage N2 49.2%, Stage N3 17.2% and Stage R (REM sleep) 0%.   RESPIRATORY ANALYSIS:  There were a total of 87 respiratory events:  21 obstructive apneas, 14 central apneas and 12 mixed apneas with a total of 47 apneas and an apnea index (AI) of 23.1. There were 40 hypopneas with a hypopnea index of 19.7.  The total APNEA/HYPOPNEA INDEX (AHI) was 42.8 /hour.  0 events occurred in REM sleep and 106 events in NREM. The patient spent 73.5 minutes sleep time in the supine position 199 minutes in non-supine.  The supine AHI was 65.1 /hour versus a non-supine AHI of 31.3 /hour.  OXYGEN SATURATION & C02:  The wake baseline 02 saturation was 96%, with the lowest being 84%. Time spent below 89% saturation equaled 52 minutes.  The arousals were noted as: 30 were spontaneous, 0 were associated with PLMs, 36 were  associated with respiratory events. The patient had a total of 0 Periodic Limb Movements. EKG with PVCs, defibrillator is present.    TITRATION STUDY WITH CPAP RESULTS: SPLIT initiated for this patient with AHI of 42/h .  Complex apnea syndrome with prolonged hypoxia in sleep.   CPAP was initiated under a SIMPLUS FFM-beginning at 5 cmH20 with heated humidity per AASM split night standards and pressure was advanced to 12 cmH20 because of hypopneas, apneas and desaturations.  At a PAP pressure of 9 cmH20, there was a reduction of the AHI to 15.4 /hour.   Total recording time (TRT) was 181 minutes, with a total sleep time (TST) of 150.5 minutes. The patient's sleep latency was 11.5 minutes. REM latency was 45.5 minutes.  The sleep efficiency was 83.1 %.    SLEEP ARCHITECTURE: Wake after sleep was 10 minutes, Stage N1 11 minutes, Stage N2 114.5 minutes, Stage N3 0 minutes and Stage R (REM sleep) 25 minutes. The percentages were: Stage N1 7.3%, Stage N2 76.1%, Stage N3 0% and Stage R (REM sleep) 16.6%. The sleep architecture was notable for REM rebounding at 10 cm water CPAP.Marland Kitchen  The arousals were noted as: 18 were spontaneous, 0 were associated with PLMs, 25 were associated with respiratory events.  RESPIRATORY ANALYSIS:  There were a total of 68 respiratory events: 7 obstructive apneas, 7 central apneas and 18 mixed apneas with a total of 32 apneas and an apnea index (AI) of 12.8.  There were 36 hypopneas with a hypopnea index of 14.4 /hour.  The patient also had 2 respiratory event related arousals (RERAs).      The total APNEA/HYPOPNEA INDEX  (AHI) was 27.1 /hour and the total RESPIRATORY DISTURBANCE INDEX was 27.9 /hour.  10 events occurred in REM sleep and 58 events in NREM. The REM AHI was 24 /hour versus a non-REM AHI of 27.7 /hour.  REM sleep was achieved on a pressure of 9 cm/h2o but hypoxia was still present. The patient spent 21% of total sleep time in the supine position. The supine AHI  was 56.3 /hour, versus a non-supine AHI of 19.2/hour.  OXYGEN SATURATION & C02:  The wake baseline 02 saturation was 92%, with the lowest being 84%. Time spent below 89% saturation equaled 10 minutes.  PERIODIC LIMB MOVEMENTS:    The patient had a total of 0 Periodic Limb Movements.    POLYSOMNOGRAPHY IMPRESSION :   The patient's sleep perception as different from the recorded sleep time. He felt he hadn't slept yet at 23.00 hours after sleeping for 30 minutes.   Complex Sleep Apnea(CSA) did not respond well to any CPAP pressure between 5 and 12 cm water- AHI remained in the double digits. The patient did d/c the CPAP titration at 3.57 AM.  REM sleep rebounded under 10 and 11 cm pressure but was still associated with hypoxia. NREM sleep hypoxia was controlled.  Strong supine dependency of AHI/ AHI not REM sleep dependent.  The technologist noted the patient was fitted with a Large Simplus full face mask.   RECOMMENDATIONS: Return for BiPAP / ASV titration. Patient has been intolerant of CPAP before, please try elevated bed, I will provide a sleep aid.  May need oxygen if hypoxia remains significant.     A follow up appointment will be scheduled in the Sleep Clinic at [facility].     I certify that I have reviewed the entire raw data recording prior to the issuance of this report in accordance with the Standards of Accreditation of the American Academy of Sleep Medicine (AASM)   Larey Seat, M.D. Medical Director, [facility] [employer] [facility] ( Neurology and Sleep Medicine)

## 2021-03-13 ENCOUNTER — Telehealth: Payer: Self-pay

## 2021-03-13 ENCOUNTER — Telehealth: Payer: Self-pay | Admitting: Neurology

## 2021-03-13 NOTE — Telephone Encounter (Signed)
-----   Message from Larey Seat, MD sent at 03/12/2021 12:44 PM EDT ----- TITRATION STUDY WITH CPAP RESULTS: SPLIT initiated for this patient with AHI of 42/h .  Complex apnea syndrome with prolonged hypoxia in sleep.  POLYSOMNOGRAPHY IMPRESSION:   The patient's sleep perception as different from the recorded sleep time. He felt he hadn't slept yet at 23.00 hours after sleeping for 30 minutes.   1. Complex Sleep Apnea(CSA) did not respond well to any CPAP pressure between 5 and 12 cm water- AHI remained in the double digits. The patient did d/c the CPAP titration at 3.57 AM.  2. REM sleep rebounded under 10 and 11 cm pressure but was still associated with hypoxia. NREM sleep hypoxia was controlled.  3. Strong positional(supine) influence on AHI. AHI not REM sleep dependent.   The technologist  fitted this patient with a Large Simplus full face mask.   RECOMMENDATIONS: Return for BiPAP / ASV titration. Patient has been intolerant of CPAP before, please try elevated bed, I will provide a sleep aid.  Can we try a dream wear interface , please?  May need oxygen if hypoxia remains significant.

## 2021-03-13 NOTE — Telephone Encounter (Signed)
I called pt. I advised pt that Dr. Brett Fairy reviewed their sleep study results and found that complex sleep apnea and recommends that pt be treated with a cpap. Dr. Brett Fairy recommends that pt return for a repeat sleep study in order to properly titrate the cpap and ensure a good mask fit. Pt is agreeable to returning for a titration study. I advised pt that our sleep lab will file with pt's insurance and call pt to schedule the sleep study when we hear back from the pt's insurance regarding coverage of this sleep study. Pt verbalized understanding of results. Pt had no questions at this time but was encouraged to call back if questions arise.

## 2021-03-13 NOTE — Telephone Encounter (Signed)
Patient left voicemail asking about results from sleep study. Please call patient back at [phone]

## 2021-03-13 NOTE — Telephone Encounter (Signed)
Called the patient back.  Was able to advise of the sleep study results and what Dr. Brett Fairy recommends.  Patient is not happy about having to return to the sleep lab but understands the reasoning.  He is going to discuss with his wife and will let us know if he decides not to move forward.  Patient is okay with the sleep lab getting insurance authorization. Patient would like to rather than use a fullface mask try using a nasal mask.  He did not like the full facemask.  Advised the patient that it really depends on his anatomy, the way he breathes, and the type of apnea he has on whether or not he would require a full facemask versus a nasal mask.  I did advise I would let the technician know that he would have a preference over the nasal mask so they may try that first.  Patient has a asked a copy of the study be placed in the mail I confirm the address on file is correct.  Patient states that if he does not move forward with the titration study it would have to be after a trip that he takes in November which would be going into December.  Advised that I will make the sleep lab aware of that as well.  If patient decides not to move forward he will give me a call back.

## 2021-03-18 DIAGNOSIS — E039 Hypothyroidism, unspecified: Secondary | ICD-10-CM | POA: Diagnosis not present

## 2021-03-18 DIAGNOSIS — I10 Essential (primary) hypertension: Secondary | ICD-10-CM | POA: Diagnosis not present

## 2021-03-19 DIAGNOSIS — I480 Paroxysmal atrial fibrillation: Secondary | ICD-10-CM | POA: Diagnosis not present

## 2021-03-19 DIAGNOSIS — Z95810 Presence of automatic (implantable) cardiac defibrillator: Secondary | ICD-10-CM | POA: Diagnosis not present

## 2021-03-19 DIAGNOSIS — I255 Ischemic cardiomyopathy: Secondary | ICD-10-CM | POA: Diagnosis not present

## 2021-03-19 DIAGNOSIS — Z7901 Long term (current) use of anticoagulants: Secondary | ICD-10-CM | POA: Diagnosis not present

## 2021-03-20 DIAGNOSIS — I255 Ischemic cardiomyopathy: Secondary | ICD-10-CM | POA: Diagnosis not present

## 2021-03-21 ENCOUNTER — Telehealth: Payer: Self-pay

## 2021-03-21 NOTE — Telephone Encounter (Signed)
LVM for pt to call me back to schedule sleep study  

## 2021-03-22 DIAGNOSIS — Z23 Encounter for immunization: Secondary | ICD-10-CM | POA: Diagnosis not present

## 2021-03-26 ENCOUNTER — Ambulatory Visit: Payer: Self-pay | Admitting: Pharmacist

## 2021-03-27 ENCOUNTER — Ambulatory Visit: Payer: Medicare Other | Admitting: Pharmacist

## 2021-03-27 ENCOUNTER — Other Ambulatory Visit: Payer: Self-pay

## 2021-03-27 DIAGNOSIS — E782 Mixed hyperlipidemia: Secondary | ICD-10-CM

## 2021-03-27 DIAGNOSIS — I10 Essential (primary) hypertension: Secondary | ICD-10-CM

## 2021-03-27 DIAGNOSIS — K219 Gastro-esophageal reflux disease without esophagitis: Secondary | ICD-10-CM

## 2021-03-27 DIAGNOSIS — E559 Vitamin D deficiency, unspecified: Secondary | ICD-10-CM

## 2021-03-27 DIAGNOSIS — N401 Enlarged prostate with lower urinary tract symptoms: Secondary | ICD-10-CM

## 2021-03-27 DIAGNOSIS — Z79899 Other long term (current) drug therapy: Secondary | ICD-10-CM

## 2021-03-27 DIAGNOSIS — R7309 Other abnormal glucose: Secondary | ICD-10-CM

## 2021-03-28 ENCOUNTER — Telehealth: Payer: Self-pay

## 2021-03-28 NOTE — Progress Notes (Signed)
Reviewed patients CPP visit and created care plan and uploaded for CPP review.   Hildred Alamin, Health Concierge [phone]

## 2021-03-29 NOTE — Progress Notes (Signed)
Follow Up Patient Visit  [name],[name]  [id number] 82 years, Male  DOB: 05/01/1941  M: [phone] Care Team: [name] [name]  Summary: [name] [name] is a pleasant 80 year old male who presents with slight forgetfulness when speaking. He stated having difficulty with adhering to every other day dosing. He has no other complaints today  Recommendations:  Initiated Omeprazole taper. Instructed pt to decrease to Omeprazole 20mg  1 tablet one time a day for 1 month. Will f/u with pt for further direction on tapering Recommend switching Doxazosin to Tamsulosin 0.4mg  QD due to lower risk of orthostatic hypotension (pt BP running low and potential for falls) Recommended discontinuing Benadryl 25mg  HS for sleep as this is contributing to BPH symptoms. Instructed pt that he can take Vitamin D 5,000 units QD rather than 10,000 units QOD to help with adherence.  __________________________________________________ [name] [name] Questions [facility]) Are you able to connect with Patient?: Yes Visit Type: Phone Call May we confirm what is the best phone # for the pharmacist to call you?: [phone] Have you been in the hospital or emergency room recently?: No Do you have any questions or concerns that you want to make sure your pharmacist addresses with your during your appointment?: No What, if any, problems do you have getting your medications from the pharmacy?: None Is there anything else that you would like me to pass along to your pharmacist or PCP?: No Patient reminded to bring medication bottles to visit (not a list of meds) OR have medication bottles pulled out prior to appointment time: Done  Engagement Notes [name] [name] on 03/28/2021 09:11 AM HC follow-up:  -January BPH Assessment: (complete AUA questionaire. Verify that pt d/c'd Benadryl at night.)  CPP follow-up:  -04/2022 call pt in 1 month to f/up on PPI taper  -06/2021 OV w/ Melford Aase  -07/2021 CCM f/u phone visit  -02/2022 OV  w/ Caryl Pina Unable to review care gaps during visit  Patient Chart [facility])  Chronic Conditions Patient's Chronic Conditions: Hypertension (HTN), Atrial Fibrillation, Other, Chronic Obstructive Pulmonary Disease (COPD), Gastroesophageal Reflux Disease (GERD), Hypothyroidism, Benign Prostatic Hyperplasia (BPH), Hyperlipidemia/Dyslipidemia (HLD) List Other Conditions (separated by comma): OSA/COPD Overlap, Vitamin D deficiency     Doctor and Hospital Visits Were there PCP Visits since last visit with the Pharmacist?: Yes Visit #1: 03/08/2021- [name], NP-Patient presented for AWV. BP 110/70, HR 70. No medication changes.  Were there Specialist Visits since last visit with the Pharmacist?: Yes Visit #1: 12/27/2020- Dr. Dohmeier(Neuro)-Patient presented for Consult for memory loss. BP 82/62, HR 70. Increased Furosemide 40mg  from once daily to twice daily.  Visit #2: 02/21/2021- Dr. Kale(Oncology)-Patient presented for F/u for JAK2 positive MPN. BP 98/57, HR 69. No medication changes. Was there a Hospital Visit in last 30 days?: No Were there other Hospital Visits since last visit with the Pharmacist?: No  Medication Information Have there been any medication changes from PCP or Specialist since last visit with the Pharmacist?: No Are there any Medication adherence gaps (beyond 5 days past due)?: No Medication adherence rates for the STAR rating drugs: Losartan 100mg  -Started 08/29/20, 90 DS Rosuvastatin 40mg  -Started 1/11/222, 90 DS  List Patient's current Care Gaps: No current Care Gaps identified  Disease Assessments  Subjective Information Current BP: 110/70 Current HR: 70 taken on: 03/07/2021 Weight: 246 BMI: 38.53 Last GFR: 66 taken on: 03/07/2021 Why did the patient present?: CCM Follow-up visit Lifestyle habits such as diet and exercise?: Caffeine intake : decaffeinated coffee- 2 cups in Am  and sodas, 1 a day, coke zero. Dinner time is between 6-7 PM.  Regular exercise  in form of walking and completing home projects. the first time at 4 AM. Alcohol, tobacco, and illicit drug usage?: Tobacco: Former smoker (quit 40 years ago- 20 ppd) ETOH use:  seldom  What is the patient's sleep pattern?: Trouble staying asleep, Other (with free form text) Details: Pt sometimes wakes up in the middle of the night to urinate. can urinate up to 3 times a night. The patient goes to bed at 2-3 AM and wakes up at 10:30 to 11AM How many hours per night does patient typically sleep?: 7-8 hours Patient pleased with health care they are receiving?: Yes Family, occupational, and living circumstances relevant to overall health?: Father died at age 33 after 45 heart attacks, all his paternal uncles and brothers died under 2 years old  Mother had dementia at 21, died at 63  Patient is retired from being a Holiday representative for [employer] lives in a household with his second spouse, and has 2 adult- biological children, and 4 step children , 7 great grand children. Factors that may affect medication adherence?: Pill burden Is Patient using [facility] pharmacy?: No Name and location of Current pharmacy: [facility] Current Rx insurance plan: Blue Medicare Are meds synced by current pharmacy?: No Are meds delivered by current pharmacy?: Yes - by mail order pharmacy Would patient benefit from direct intervention of clinical lead in dispensing process to optimize clinical outcomes?: Yes Are [facility] pharmacy services available where patient lives?: Yes Is patient disadvantaged to use [facility]?: Yes Does patient experience delays in picking up medications due to transportation concerns (getting to pharmacy)?: No  Hypertension (HTN) Assess this condition today?: Yes Is patient able to obtain BP reading today?: No Goal: <140/90 mmHG Hypertension Stage: Normal (SBP <120 and DBP < 80) Is Patient checking BP at home?: No How often does patient miss taking their blood  pressure medications?: Pt does not miss meds Has patient experienced hypotension, dizziness, falls or bradycardia?: No Check present secondary causes (below) for HTN: Obesity, Sleep Apnea Does Patient use RPM device?: No BP RPM device: Does patient qualify?: Yes We discussed: Proper Home BP Measurement, Targeting 150 minutes of aerobic activity per week, Other (provide details below) Details: Encouraged pt to check blood pressure 1 to 2 times a week, especially if he notices symptoms of low blood pressure such as dizziness/lightheadedness, fainting, low heart rate Assessment:: Controlled Drug: Losartan 100mg  daily Assessment: Appropriate, Effective, Safe, Accessible Drug: Metoprolol XL 200mg  QD Assessment: Appropriate, Effective, Safe, Accessible Drug: Spironolactone 25mg  QD Assessment: Appropriate, Effective, Safe, Accessible Drug: Furosemide 40mg  BID Assessment: Appropriate, Effective, Safe, Accessible Additional Info: Pt not checking at home but has a BP monitor at home. He does not see the need to check BP  Pt states at young age had high blood pressure and high cholesterol as well. Things changed when pt retired from work and his BP stopped being high. He denies any dizziness or falls at this time. Plan to Counsel: Encouraged pt to check blood pressure 1 to 2 times a week, especially if he notices symptoms of low blood pressure such as dizziness/lightheadedness, fainting, low heart rate HC Follow up: N/A Pharmacist Follow up: Follow up w/ pt in March, assess BP and s/s of hypotension  Hyperlipidemia/Dyslipidemia (HLD) Last Lipid panel on: 11/28/2020 TC (Goal<200): 122 LDL: 75 HDL (Goal>40): 22 TG (Goal<150): 148 ASCVD 10-year risk?is:: N/A due to  Age > 92 Assess this condition today?: Yes LDL Goal: <70 Has patient tried and failed any HLD Medications?: Yes Medications failed: atorvastatin, pravastatin atorvastatin: Change in therapy pravastatin: Change in therapy Check  present secondary causes (below) that can lead to increased cholesterol levels (multi-choice optional): Beta blockers, Hypothyroidism We discussed: How to reduce cholesterol through diet/weight management and physical activity., How a diet high in plant sterols (fruits/vegetables/nuts/whole grains/legumes) may reduce your cholesterol., Encouraged increasing fiber to a daily intake of 10-25g/day Assessment:: Uncontrolled Drug: Rosuvastatin 40mg  QD Assessment: Appropriate, Query Effectiveness Plan to Order: Lipid Panel due  Plan to Review: Lipid panel Plan to Counsel: Counseled pt on ways to decrease bad choleserol (LDL) specifically by eating [place] nuts (4 per month), increasing fiber intake (oatmeal and veggies) and foods like fish and avocadoes are high in "good cholesterol" (HDL). HC Follow up: N/A Pharmacist Follow up: Review Lipid panel at next Dr. Visit  GERD Assessment Assess this condition today?: Yes How frequently do you have symptoms of GERD or reflux?: never Does patient experience difficulty or pain with swallowing?: No How long have you taken prescription medications for GERD?: 6 years What potential triggering foods have you identified and tried to limit intake in your diet?: none We discussed: Other Details: Initiate PPI taper Assessment:: Controlled Drug: Omeprazole 20mg  BID Assessment: Query need Plan to Modify: Will slowly taper Omeprazole. Instructed patient to take Omeprazole 20mg  once a day for one month, then I will call pt in December and if successfully decreased to 20mg  QD, inform him to decrease to Omeprazole 10mg  for 2 weeks. (Pt is uncomfortable with every other day dosing and will like to attempt a slow taper of Omeprazole) HC Follow up: N/A Pharmacist Follow up: Call pt. in December to continue PPI taper  BPH Assessment Assess this condition today?: Yes Current PSA: 0.10 taken on: 05/09/2020 Are you experiencing any side effects from your BPH medication?:  Low blood pressure What changes have you made to your diet / lifestyle to help manage your BPH symptoms?: Taking extra time to ensure complete voiding of the bladder (i.e. waiting a few moments then trying to void again) Completing BPH AUA Questionnaire today?: Yes BPH AUA: Over the past month, how often have you been bothered by the following problems?: Done Sensation of not emptying your bladder completely after you finish urinating?: 5 (Almost always) Had to urinate again less than 2 hours after you finished urinating?: 4 (More than half the time) Stopped and started again several times when you urinate?: 5 (Almost always) Found it difficult to postpone urination?: 0 (Not at all) Had a weak urinary stream?: 0 (Not at all) Had to push or strain to begin urination?: 0 (Not at all) Total AUA Symptom Score (please total responses for questions above): 14 AUA Symptom Severity: Moderate (Score: 8-19) How would you feel if you had to live with your urinary condition the way it is now, no better, no worse, for the rest of your life?: Mostly Not Satisfied We discussed: Double voiding, Avoid decongestants and antihistamines Assessment:: Uncontrolled Drug: Doxazosin 4mg  QD Assessment: Appropriate, Query Effectiveness Drug: Finasteride 5mg  QD Assessment: Appropriate, Query Effectiveness Additional Info: Pt is taking benadryl every night for sleep which can worsen BPH. Pt does not frequently have issues with falling asleep and also takes melatonin as needed. Plan to Stop: Discontinue Benadryl 25mg  HS nightly for sleep Plan to (other): Will reach out to provider and recommend switching to tamsulosin 0.4mg  lower risk of orthostatic hypotension, and  lower effect on BP. HC Follow up: January BPH Assessment: (complete AUA questionnaire. Verify that pt d/c'd Benadryl at night.) Pharmacist Follow up: Follow-up with provider response. Follow up with pt symptoms  General Disease Assessment Assess this  condition today?: Yes What condition are we assessing today?: Pre-diabetes Pertinent Labs: A1c: 5.3 (11/2020),  5.8  (04/2020)  Assessment:: Controlled Drug: Lifestyle modifications Assessment: Query need Plan to Counsel: Congratulate patient on making proper dietary changes and bringing down A1c to less than 5.7% HC Follow up: N/A Pharmacist Follow up: Follow-up with diet and blood glucose/A1c at next visit  General Disease Assessment Assess this condition today?: Yes What condition are we assessing today?: Vitamin D deficiency Pertinent Labs: Vit D level: 82 (7/22), 64(12/21), 76(6/21) Other Information: Reccomended at last visit that pt can decrease VitD supplement to 10,000 unit every other day, pt was unable to make this change due to having difficulty with adherence to every other day dosing. Informed pt that he can start taking Vitamin D 5,000units QD OTC. Assessment:: Controlled Drug: Vitamin D 10,000 units QD Assessment: Query need HC Follow up: N/A Pharmacist Follow up: assess VitD level at next visit.  Exercise, Diet and Non-Drug Coordination Needs Additional exercise counseling points. We discussed: targeting at least 151 minutes per week of moderate-intensity aerobic exercise., incorporating flexibility, balance, and strength training exercises, decreasing sedentary behavior Additional diet counseling points. We discussed: limiting caffeine intake, aiming to consume at least 8 cups of water day Discussed Non-Drug Care Coordination Needs: Yes Does Patient have Medication financial barriers?: No Accountable Health Communities Health-Related Social Needs Screening Tool -  SDOH  ([email or web address])  Engagement Notes [name] [name] on 03/27/2021 02:07 PM HC Chart Prep: 63min  CPP Chart Review: 25 min  CPP Office Visit: 55 min  CPP Office Visit Documentation: 66 min  CPP Coordination of Care: 11 min  HC Care Plan  Completion:  CPP Care Plan Review: 29 min    CARE PLAN  Patient Name:??[name] [name], [name] [name]  DOB:?18-Oct-1940??   Last Care Plan Update:??03/28/2021   COMPREHENSIVE CARE PLAN AND GOALS??   HYPERTENSION??   MOST RECENT BLOOD PRESSURE:??110/70 on 03/07/21   MY GOAL BLOOD PRESSURE:??<140/90   CURRENT MEDICATION AND DOSING:??Losartan 100mg  daily, Metoprolol XL 200mg  daily, Spironolactone 25mg  daily, Furosemide 40mg  BID   THE GOALS WE HAVE CHOSEN ARE:????   Maintain an at goal blood pressure??   BARRIERS TO ACHIEVING GOALS: At goal   PLAN TO WORK ON THESE GOALS:??   -Take medications regularly???   -Check BP at home??  -Reduce salt intake (< 1500mg / day)??  -Diet: DASH diet (Choose fruits, vegetables, and low-fat dairy products. Increase whole grains, fish, poultry, nuts. Reduce red meats and sugars)??  -Weight: 1 kg = ~1 mmHg reduction??  -Exercise??   ??   CHOLESTEROL??   MOST RECENT LABS:? 11/28/2020????   TOTAL CHOLESTEROL:??122   TRIGLYCERIDES:??148   HDL:??22   LDL:??75   CURRENT MEDICATION AND DOSING:? Rosuvastatin 40mg  daily?   THE GOALS WE HAVE CHOSEN ARE:????   Total Cholesterol goal under 200, Triglycerides goal under 150, HDL goal above 40, LDL goal under 70??   BARRIERS TO ACHIEVING GOALS:??Uncontrolled   PLAN TO WORK ON THESE GOALS:??   -Take medication regularly??   -Diet: high in plant sterols (fruits/ vegetables/ nuts/ whole grains/ legumes). Increase fiber intake (10-25g/day). 4 [place] nuts per month can bring your LDL cholesterol down. Avoid foods high in cholesterol (red meat, egg yolks, dairy, oils/ butter). Choose low-fat options.??  -Exercise??  -Weight Management??  Prediabetes/ Impaired Fasting Glucose??   MOST RECENT A1C:??5.3 on 11/2020?????????????????????????   ?   CURRENT REGIMEN AND DOSING:??   None??   THE GOALS WE HAVE CHOSEN ARE:??   Lower A1C/ Fasting Glucose??   PLAN TO WORK ON THESE GOALS:????   -Focus on  managing/monitoring CVD risk factors, including keeping blood pressure and lipids under goal.???   -Modify lifestyle, including participating in moderate physical activity (e.g., walking) at least 150 minutes per week.???   -Consider a Mediterranean eating plan with an emphasis on whole grains, legumes, nuts, fruits, and vegetables and minimal refined and processed foods.????   ??   ??   GERD??   CURRENT REGIMEN AND DOSING:??Omeprazole 20mg  1 time a day   THE GOALS WE HAVE CHOSEN ARE:? Minimize reflux symptoms.??   BARRIERS TO ACHIEVING GOALS:??At goal   PLAN TO WORK ON THESE GOALS:????   -Start taking Omeprazole 1 time a day with your dinner for 1 month. I will call you 1 month to assess your reflux symptoms and give you instructions on how to further reduce the dose.   -Educated on ways to prevent acid reflux, such as avoid spicy foods, smaller portion sizes, wearing loose clothing, avoiding caffeine, and to raise head of bed.????      ????   ????   BPH   CURRENT REGIMEN AND DOSING:??Doxazosin 4mg  daily, Finasteride 5mg  daily   THE GOALS WE HAVE CHOSEN ARE:??Double voiding, Avoid decongestants and antihistamines   BARRIERS TO ACHIEVING GOALS:??Uncontrolled   PLAN TO WORK ON THESE GOALS:?? Discontinue Benadryl 25mg  nightly for sleep. I will reach out to provider on switching Doxazosin 4mg  to Tamsulosin 0.4mg  daily to decrease your risk of low blood pressure and dizziness.   ??   Vitamin D Deficiency    CURRENT REGIMEN AND DOSING:??Vitamin D 10,000 units daily   BARRIERS TO ACHIEVING GOALS:?At goal   PLAN TO WORK ON THESE GOALS:????It is okay for you to switch to Vitamin D 5,000 units daily instead of taking Vitamin D 10,000 units every other day.   ??   ACTIVE MEDICATION LIST??   Your current medication list has been updated. To view, log in to your patient portal.??   Call if any changes need to be made.??   ??   MEDICATION REVIEW??   MEDICATION REVIEW  CONDUCTED:?? Yes??   DATE: 03/27/2021????   BEST POSSIBLE MEDICATION HISTORY??   SOURCE:?? Medical Records??   ??   ??   HOW DO I? - WHEN DO I???   GET AHOLD OF MY DOCTOR??   DURING BUSINESS HOURS WHEN THE OFFICE IS OPEN??   PHONE NUMBER: [phone]??   AFTER HOURS [facility] NURSE WHEN THE OFFICE IS CLOSED???   PHONE: [phone]??   TALK TO [place] COORDINATOR???   NAME: [name] [name]?   PHONE: [phone]?   EMAIL:???   [email or web address]??   CARE COORDINATOR STAFF??    Danae Chen Ratliff??   Contact Phone Number: [phone]??   ??   NOTE SECTION??   Thank you for participating in the Chronic Care Management (CCM) program with Dr.??McKeown   ??   This program takes a proactive approach to your health and my team will serve as a resource for you throughout the year. Please follow up at [phone] if you have any questions or experience changes to your overall health. Your next CCM appointment will be conducted with [name] [name], PharmD as follows:??   ??   Date??   ??07/23/2021   Time??   ??2:30pm  Over the Phone

## 2021-03-30 ENCOUNTER — Other Ambulatory Visit: Payer: Self-pay | Admitting: Internal Medicine

## 2021-04-17 ENCOUNTER — Telehealth: Payer: Self-pay

## 2021-04-17 DIAGNOSIS — E039 Hypothyroidism, unspecified: Secondary | ICD-10-CM | POA: Diagnosis not present

## 2021-04-17 DIAGNOSIS — I10 Essential (primary) hypertension: Secondary | ICD-10-CM | POA: Diagnosis not present

## 2021-04-17 NOTE — Progress Notes (Signed)
Care plan printed and mailed to patient  Kaylea Mounsey, Health Concierge 

## 2021-04-25 ENCOUNTER — Ambulatory Visit (INDEPENDENT_AMBULATORY_CARE_PROVIDER_SITE_OTHER): Payer: Medicare Other | Admitting: Neurology

## 2021-04-25 ENCOUNTER — Other Ambulatory Visit: Payer: Self-pay

## 2021-04-25 DIAGNOSIS — I480 Paroxysmal atrial fibrillation: Secondary | ICD-10-CM

## 2021-04-25 DIAGNOSIS — D471 Chronic myeloproliferative disease: Secondary | ICD-10-CM

## 2021-04-25 DIAGNOSIS — G4731 Primary central sleep apnea: Secondary | ICD-10-CM

## 2021-04-25 DIAGNOSIS — G4733 Obstructive sleep apnea (adult) (pediatric): Secondary | ICD-10-CM

## 2021-04-25 DIAGNOSIS — I2510 Atherosclerotic heart disease of native coronary artery without angina pectoris: Secondary | ICD-10-CM

## 2021-04-25 DIAGNOSIS — Z789 Other specified health status: Secondary | ICD-10-CM

## 2021-04-25 DIAGNOSIS — Z950 Presence of cardiac pacemaker: Secondary | ICD-10-CM

## 2021-04-25 DIAGNOSIS — D45 Polycythemia vera: Secondary | ICD-10-CM

## 2021-04-26 ENCOUNTER — Telehealth: Payer: Self-pay | Admitting: Pharmacist

## 2021-04-27 ENCOUNTER — Other Ambulatory Visit: Payer: Self-pay | Admitting: Internal Medicine

## 2021-04-27 DIAGNOSIS — E039 Hypothyroidism, unspecified: Secondary | ICD-10-CM

## 2021-04-27 DIAGNOSIS — E782 Mixed hyperlipidemia: Secondary | ICD-10-CM

## 2021-04-27 DIAGNOSIS — M1000 Idiopathic gout, unspecified site: Secondary | ICD-10-CM

## 2021-04-27 DIAGNOSIS — I5042 Chronic combined systolic (congestive) and diastolic (congestive) heart failure: Secondary | ICD-10-CM

## 2021-04-29 NOTE — Telephone Encounter (Signed)
CPP follow-up: (04/2022 call pt in 1 month to f/up on PPI taper)  04/26/2021:   Called patient to follow-up.  Informed patient that he can discontinue Doxazosin for BP. PT BP is still ranging in the 100s-110s/70s. Informed pt to let us know if he is having any LUTS  Pt is not experiencing any heart burn from decreasing omeprazole dose. Informed patient that he may completely discontinue omeprazole and monitor for any rebound heartburn. Counseled on using H2RA's if needed for heartburn. Patient voiced understanding  Assessment review call: 11 min   Rachelle Hora. Jeannett Senior, PharmD  Clinical Pharmacist  [email or web address]  [phone]

## 2021-04-30 DIAGNOSIS — Z7901 Long term (current) use of anticoagulants: Secondary | ICD-10-CM | POA: Diagnosis not present

## 2021-04-30 DIAGNOSIS — I480 Paroxysmal atrial fibrillation: Secondary | ICD-10-CM | POA: Diagnosis not present

## 2021-04-30 DIAGNOSIS — Z95810 Presence of automatic (implantable) cardiac defibrillator: Secondary | ICD-10-CM | POA: Diagnosis not present

## 2021-04-30 DIAGNOSIS — I255 Ischemic cardiomyopathy: Secondary | ICD-10-CM | POA: Diagnosis not present

## 2021-05-02 ENCOUNTER — Telehealth: Payer: Self-pay | Admitting: Neurology

## 2021-05-02 DIAGNOSIS — G4731 Primary central sleep apnea: Secondary | ICD-10-CM

## 2021-05-02 DIAGNOSIS — I480 Paroxysmal atrial fibrillation: Secondary | ICD-10-CM

## 2021-05-02 DIAGNOSIS — Z789 Other specified health status: Secondary | ICD-10-CM

## 2021-05-02 DIAGNOSIS — J449 Chronic obstructive pulmonary disease, unspecified: Secondary | ICD-10-CM

## 2021-05-02 NOTE — Telephone Encounter (Signed)
PATIENT'S NAME:  [name] [name], [name] [name] DOB:      Oct 30, 1940      MR#:    [id number]     DATE OF RECORDING: 04/25/2021 REFERRING M.D.:  [name], NP Study Performed:   BiPAP mit ST Titration HISTORY:  [name] [name] is an 80 year-old Caucasian male patient who was seen upon referral on 12/27/2020 for a Sleep Consultation. Chief concern:  " please repeat sleep study. Prior SS done in [place] about 7 years ago, patient was unable to complete study.  Pt states his mouth drops open and is having a hard time sleeping at night. Pt states he has noticed some memory concerns and his poor sleep may have been affecting his memory".  RETURN after TITRATION STUDY WITH CPAP RESULTS: SPLIT initiated for this patient with AHI of 42/h. Complex apnea syndrome with prolonged hypoxia in sleep.  POLYSOMNOGRAPHY IMPRESSION: 02-27-2021:  The patient's sleep perception as different from the recorded sleep time. He felt he hadn't slept yet at 23.00 hours after sleeping for 30 minutes.   1.        Complex Sleep Apnea (CSA) did not respond well to any CPAP pressure between 5 and 12 cm water- AHI remained in the double digits. The patient did d/c the CPAP titration at 3.57 AM.  2.        REM sleep rebounded under 10 and 11 cm pressure but was still associated with hypoxia. NREM sleep hypoxia was controlled.  3.        Strong positional(supine) influence on AHI. AHI not REM sleep dependent.   The technologist fitted this patient with a Large Simplus full face mask  RECOMMENDATIONS: Return for BiPAP / ASV but allow non supine sleep.   Usually, his sleep is restorative and refreshing. He wakes up with a dry mouth - he is a mouth breather and does have rhinitis and at night the nasal passages often swell.  He had already undergone a nasal septal repair surgery, but it has not completely restored nasal patency especially his right nasal passage is narrow. Defibrillator patient.   The patient endorsed the Epworth  Sleepiness Scale at 2 points.   The patient's weight 245 pounds with a height of 67 (inches), resulting in a BMI of 38.4 kg/m2. The patient's neck circumference measured 16 inches.  CURRENT MEDICATIONS: Tylenol, Proventil, Zyloprim, Aspirin, Vitamin D, Benadryl, DHA, Colace, Cardura, Proscar, Lasix, Hydrea, Atrovent, Imdur, Synthroid, Cozaar, Mag-Ox, Melatonin, Toprol-XL, Multivitamin, Prilosec, Glycolax, Probiotic, Crestor, Aldactone    PROCEDURE:  This is a multichannel digital polysomnogram utilizing the SomnoStar 11.2 system.  Electrodes and sensors were applied and monitored per AASM Specifications.   EEG, EOG, Chin and Limb EMG, were sampled at 200 Hz.  ECG, Snore and Nasal Pressure, Thermal Airflow, Respiratory Effort, CPAP Flow and Pressure, Oximetry was sampled at 50 Hz. Digital video and audio were recorded.       CPAP intolerance was confirmed in SPLIT study, BiPAP was initiated at 10/5 cmH20 under a nasal mask- with heated humidity per AASM standards and pressure was advanced to BiPAP with 10 ST at pressure of 13/8 cmH20 because of hypopneas, apneas and desaturations. a FFM Evora was tried at 23.13 hours.  At a BiPAP pressure of 13/8 cmH20, and ST 10,  there was a reduction of the AHI to 11.3/h with some improvement of sleep apnea. 80 minutes of sleep were recorded under this pressure. The patient woke before 1 AM and could not reinitiate sleep- The  patient left AMA at 1.30 AM.  Lights Out was at 22:11 and Lights On at 01:00. Total recording time (TRT) was 169.5 minutes, with a total sleep time (TST) of 138 minutes. The patient's sleep latency was 6 minutes. REM latency was 0 minutes.  The sleep efficiency was 81.4 %.    SLEEP ARCHITECTURE: WASO (Wake after sleep onset) was 18 minutes.  There were 6.5 minutes in Stage N1, 129 minutes Stage N2, 2.5 minutes Stage N3 and 0 minutes in Stage REM.  The percentage of Stage N1 was 4.7%, Stage N2 was 93.5%, Stage N3 was 1.8% and Stage R (REM sleep)  was 0%. The sleep architecture was notable for lack of REM sleep   RESPIRATORY ANALYSIS:  There was a total of 83 respiratory events: 0 obstructive apneas, 29 central apneas and 0 mixed apneas with a total of 29 apneas and an apnea index (AI) of 12.6 /hour. There were 54 hypopneas with a hypopnea index of 23.5/hour. The total APNEA/HYPOPNEA INDEX (AHI) was 36.1 /hour. 0 events occurred in REM sleep and 83 events in NREM. No REM sleep was seen.  The patient spent 52.5 minutes of total sleep time in the supine position and 86 minutes in non-supine. The supine AHI was 70.9/h, versus a non-supine AHI of 14.7/h.  OXYGEN SATURATION & C02: The baseline 02 saturation was 96%, with the lowest being 79%. Time spent below 89% saturation equaled 47 minutes. PERIODIC LIMB MOVEMENTS:  The patient had a total of 0 Periodic Limb Movements. The arousals were noted as: 9 were spontaneous, 0 were associated with PLMs, 15 were associated with respiratory events. EKG was in paced rhythm with some PVC. Post-study, the patient indicated that sleep was worse than usual.   DIAGNOSIS: Severe central Sleep Apnea did improve under BiPAP at 10 ST, 13 / 8 cm water, but the patient felt unable to tolerate the set up. Hypoxia was significant.  The patient was fitted with a S/M Evora Full mask, remaining intolerant of PAP modalities, such as CPAP, BiPAP and BiPAP ST.  PLANS/RECOMMENDATIONS:  I will ask the patient to avoid supine sleep and only sleep on his side. Since PAP therapy is not working for him, we have no good alternatives-   Central apneas were dominating and hypoxia persisted- these are exclusion criterium for a dental device or on Inspire.    DISCUSSION: A follow up appointment can be scheduled in the Sleep Clinic at [facility].   Please call [phone] with any questions.     I certify that I have reviewed the entire raw data recording prior to the issuance of this report in accordance with the  Standards of Accreditation of the American Academy of Sleep Medicine (AASM)   Larey Seat, M.D. Diplomat, Tax adviser of Psychiatry and Neurology  Diplomat, Tax adviser of Sleep Medicine Market researcher, [employer] [facility] at [employer]

## 2021-05-06 ENCOUNTER — Encounter: Payer: Self-pay | Admitting: Neurology

## 2021-05-06 NOTE — Telephone Encounter (Signed)
I called pt. I advised pt that Dr. Brett Fairy reviewed their sleep study results and found that pt was able to note there was treatment until BiPAP ST but was unable to tolerate the mask and left early. Dr. Brett Fairy recommends since he is unable to tolerate the machine and mask he should avoid sleeping on his back. After reviewing the study the patient is willing to try moving forward with the BiPAP ST I reviewed PAP compliance expectations with the pt. Pt is agreeable to starting a CPAP. I advised pt that an order will be sent to a DME, [facility], and [facility] will call the pt within about one week after they file with the pt's insurance. [facility] will show the pt how to use the machine, fit for masks, and troubleshoot the CPAP if needed. A follow up appt was made for insurance purposes with Dr. Brett Fairy on August 19 2021 at 1:30 pm. Pt verbalized understanding to arrive 15 minutes early and bring their CPAP. A letter with all of this information in it will be mailed to the pt as a reminder. I verified with the pt that the address we have on file is correct. Pt verbalized understanding of results. Pt had no questions at this time but was encouraged to call back if questions arise. I have sent the order to [facility] and have received confirmation that they have received the order.

## 2021-05-06 NOTE — Addendum Note (Signed)
Addended by: Darleen Crocker on: 05/06/2021 12:25 PM   Modules accepted: Orders

## 2021-05-23 ENCOUNTER — Other Ambulatory Visit: Payer: Self-pay

## 2021-05-23 DIAGNOSIS — D45 Polycythemia vera: Secondary | ICD-10-CM

## 2021-05-24 ENCOUNTER — Inpatient Hospital Stay: Payer: Medicare Other | Attending: Hematology | Admitting: Hematology

## 2021-05-24 ENCOUNTER — Other Ambulatory Visit: Payer: Self-pay

## 2021-05-24 ENCOUNTER — Inpatient Hospital Stay: Payer: Medicare Other

## 2021-05-24 VITALS — BP 126/54 | HR 70 | Temp 97.9°F | Resp 18 | Wt 238.6 lb

## 2021-05-24 DIAGNOSIS — D45 Polycythemia vera: Secondary | ICD-10-CM

## 2021-05-24 DIAGNOSIS — D75839 Thrombocytosis, unspecified: Secondary | ICD-10-CM | POA: Diagnosis not present

## 2021-05-24 LAB — CBC WITH DIFFERENTIAL (CANCER CENTER ONLY)
Abs Immature Granulocytes: 0.1 10*3/uL — ABNORMAL HIGH (ref 0.00–0.07)
Basophils Absolute: 0.1 10*3/uL (ref 0.0–0.1)
Basophils Relative: 1 %
Eosinophils Absolute: 0.1 10*3/uL (ref 0.0–0.5)
Eosinophils Relative: 1 %
HCT: 47.2 % (ref 39.0–52.0)
Hemoglobin: 15.9 g/dL (ref 13.0–17.0)
Immature Granulocytes: 1 %
Lymphocytes Relative: 14 %
Lymphs Abs: 1.3 10*3/uL (ref 0.7–4.0)
MCH: 37.6 pg — ABNORMAL HIGH (ref 26.0–34.0)
MCHC: 33.7 g/dL (ref 30.0–36.0)
MCV: 111.6 fL — ABNORMAL HIGH (ref 80.0–100.0)
Monocytes Absolute: 0.5 10*3/uL (ref 0.1–1.0)
Monocytes Relative: 5 %
Neutro Abs: 7.6 10*3/uL (ref 1.7–7.7)
Neutrophils Relative %: 78 %
Platelet Count: 317 10*3/uL (ref 150–400)
RBC: 4.23 MIL/uL (ref 4.22–5.81)
RDW: 14.3 % (ref 11.5–15.5)
WBC Count: 9.6 10*3/uL (ref 4.0–10.5)
nRBC: 0 % (ref 0.0–0.2)

## 2021-05-24 LAB — CMP (CANCER CENTER ONLY)
ALT: 22 U/L (ref 0–44)
AST: 26 U/L (ref 15–41)
Albumin: 4.3 g/dL (ref 3.5–5.0)
Alkaline Phosphatase: 51 U/L (ref 38–126)
Anion gap: 7 (ref 5–15)
BUN: 22 mg/dL (ref 8–23)
CO2: 29 mmol/L (ref 22–32)
Calcium: 9.7 mg/dL (ref 8.9–10.3)
Chloride: 100 mmol/L (ref 98–111)
Creatinine: 1.14 mg/dL (ref 0.61–1.24)
GFR, Estimated: >60 mL/min (ref >60)
Glucose, Bld: 113 mg/dL — ABNORMAL HIGH (ref 70–99)
Potassium: 4.4 mmol/L (ref 3.5–5.1)
Sodium: 136 mmol/L (ref 135–145)
Total Bilirubin: 1.4 mg/dL — ABNORMAL HIGH (ref 0.3–1.2)
Total Protein: 7 g/dL (ref 6.5–8.1)

## 2021-05-27 ENCOUNTER — Telehealth: Payer: Self-pay | Admitting: Pharmacist

## 2021-05-27 NOTE — Telephone Encounter (Signed)
Called pt.for General Review Call today. Pt. stated his health has remained the same since his last visit. Pt. stated he feels pretty good at the moment and is seeing Dr. Melford Aase next week for a routine visit. Instructed patient to call if anything changes with his health and will continue to monitor. Pt, verbalized agreement and understanding.

## 2021-05-28 ENCOUNTER — Encounter: Payer: Medicare Other | Admitting: Internal Medicine

## 2021-05-29 DIAGNOSIS — U071 COVID-19: Secondary | ICD-10-CM | POA: Diagnosis not present

## 2021-05-29 NOTE — Telephone Encounter (Signed)
Called pt.today to go over AUA questionnaire.  BPH AUA: Over the past month, how often have you been bothered by the following problems?:  Sensation of not emptying your bladder completely after you finish urinating?:  o 0 (Not at all)  o 1 (Less than 1 time in 5)  o 2 (Less than half the time)  o 3 (About half the time)  o 4 (More than half the time)  o 5 (Almost always)  Had to urinate again less than 2 hours after you finished urinating?:  o 0 (Not at all)  o 1 (Less than 1 time in 5)  o 2 (Less than half the time)  o 3 (About half the time)  o 4 (More than half the time)  o 5 (Almost always)  Stopped and started again several times when you urinate?:  o 0 (Not at all)  o 1 (Less than 1 time in 5)  o 2 (Less than half the time)  o 3 (About half the time)  o 4 (More than half the time)  o 5 (Almost always)  Found it difficult to postpone urination?:  o 0 (Not at all)  o 1 (Less than 1 time in 5)  o 2 (Less than half the time)  o 3 (About half the time)  o 4 (More than half the time)  o 5 (Almost always)  Had a weak urinary stream?:  o 0 (Not at all)  o 1 (Less than 1 time in 5)  o 2 (Less than half the time)  o 3 (About half the time)  o 4 (More than half the time)  o 5 (Almost always)  Had to push or strain to begin urination?:  o 0 (Not at all)  o 1 (Less than 1 time in 5)  o 2 (Less than half the time)  o 3 (About half the time)  o 4 (More than half the time)  o 5 (Almost always)  How many times do you typically get up to urinate from the time you went to bed until the time you got up in the morning?:  o 0 (Not at all)  o 1 (Less than 1 time in 5)  o 2 (Less than half the time)  o 3 (About half the time)  o 4 (More than half the time)  o 5 (Almost always)  Total AUA Symptom Score (please total responses for questions above):  AUA Symptom Severity:  o Mild (Score: 0-7)  o Moderate (Score: 8-19)  o Severe  (Score: 20-35)  How would you feel if you had to live with your urinary condition the way it is now, no better, no worse, for the rest of your life?:  o Delighted  o Pleased  o Mostly Satisfied  o Mixed  o Mostly Not Satisfied  o Unhappy  SCORE: 10  Pt. stated he did d/c Benadryl  LM-05/29/21

## 2021-05-30 ENCOUNTER — Encounter: Payer: Self-pay | Admitting: Hematology

## 2021-05-30 NOTE — Progress Notes (Signed)
HEMATOLOGY/ONCOLOGY CLINIC NOTE  Date of Service: .05/24/2021   Patient Care Team: [name], MD as PCP - General (Internal Medicine) Georg Ruddle, Ashok Cordia, MD as Referring Physician (Cardiology) Shirlee More Ronalee Red, MD as Referring Physician (Cardiology) Brunetta Genera, MD as Consulting Physician (Hematology) Rush Landmark, [facility] as Pharmacist (Pharmacist)  CHIEF COMPLAINT Follow-up for JAK2 positive MPN  HISTORY OF PRESENTING ILLNESS:   [name] [name] is a wonderful 81 y.o. male who has been referred to [place] by Dr. [name]  for evaluation and management of Thrombocytosis. He is accompanied today by his wife. The pt reports that he is doing well overall.   The pt reports that he has never had any blood clots.  He has atrial fibrillation and has taken Coumadin since 2015. He had heart surgery in 2015, and had his ICD placed in 2016. The pt also takes 81g aspirin daily.   The pt notes that he has not been aware of his high platelets previously, but only as of his most recent labs. He denies any recent major bleeds, surgeries, and other trauma. He denies having a splenectomy at any time as well.   The pt notes that his right kidney gets sore from time to time when he doesn't stay well hydrated. He also notes that when he urinates at these times, his urine is very dark. The pt adds that he had kidney stones one time. The pt is also taking Lasix and notes that he urinates frequently.   The pt adds that his gums have been very sore, even to touch from his tongue.  The pt notes that he does not want to use a CPAP after a bad experience with a previous sleep study that did diagnose him with sleep apnea. He notes that he falls asleep several times a day as well, falling asleep easily when he sits down, and denying falling asleep while driving. He also notes some increased difficulty remembering.   Most recent lab results (12/09/17) of CBC is as follows: all values are WNL  except for WBC at 11.9k, RDW at 18.4, PLT at 649k, ANC at 9.7k.  On review of systems, pt reports good energy levels, falling asleep several times a day, staying active, and denies recent surgery, recent trauma, bleeding, unexpected weight loss, pain along the spine, abdominal pains, and any other symptoms.   On Family Hx the pt reports prostate cancer.   INTERVAL HISTORY:   [name] [name] is here for follow-up and continued management of his JAK2 positive MPN . He notes no acute new concerns since his last clinic visit . No prohibitive toxicities from his current dose of hydroxyurea. No infection issues. Labs done today were reviewed in detail with the patient.   MEDICAL HISTORY:  Past Medical History:  Diagnosis Date   A-fib ([place]) 01/2014   AICD (automatic cardioverter/defibrillator) present    [employer]   Anal fissure 11/10/2019   Arthritis    ASHD (arteriosclerotic heart disease) 2015   s/p CABG   Asthma    CAD (coronary artery disease)    Cataract    s/p left   CHF (congestive heart failure) (HCC)    COPD (chronic obstructive pulmonary disease) ([place])    by CXR, pt unsure of this   Diverticulosis    GERD (gastroesophageal reflux disease)    Gout 2014   Hyperlipidemia    Hypertension    Hypothyroidism    LBBB (left bundle branch block)    Polycythemia  Pre-diabetes    Prediabetes 01/2014   Presence of permanent cardiac pacemaker    Sleep apnea    no CPAP   Thyroid disease    hypothyroid    SURGICAL HISTORY: Past Surgical History:  Procedure Laterality Date   CARDIAC DEFIBRILLATOR PLACEMENT  07/2014   CATARACT EXTRACTION W/ INTRAOCULAR LENS IMPLANT Left    CORONARY ARTERY BYPASS GRAFT  11/2013   lumb laminectomy  [id number]   x 3   LUMBAR FUSION  2013   NASAL SEPTOPLASTY W/ TURBINOPLASTY Bilateral 01/30/2016   Procedure: NASAL SEPTOPLASTY WITH BILATERAL TURBINATE REDUCTION;  Surgeon: Rozetta Nunnery, MD;  Location: [place];  Service: ENT;   Laterality: Bilateral;   TONSILLECTOMY     UVULECTOMY N/A 01/30/2016   Procedure: Martyn Ehrich;  Surgeon: Rozetta Nunnery, MD;  Location: [place];  Service: ENT;  Laterality: N/A;    SOCIAL HISTORY: Social History   Socioeconomic History   Marital status: Married    Spouse name: [name]    Number of children: 2   Years of education: 13   Highest education level: Not on file  Occupational History   Occupation: Retired  Tobacco Use   Smoking status: Former    Packs/day: 2.00    Years: 25.00    Pack years: 50.00    Types: Cigarettes    Quit date: 05/20/1983    Years since quitting: 38.0   Smokeless tobacco: Never  Vaping Use   Vaping Use: Never used  Substance and Sexual Activity   Alcohol use: Not Currently    Alcohol/week: 0.0 standard drinks   Drug use: No   Sexual activity: Not on file  Other Topics Concern   Not on file  Social History Narrative   Lives with wife, [name]   Caffeine use: daily (Coffee)   Social Determinants of Health   Financial Resource Strain: Not on file  Food Insecurity: Not on file  Transportation Needs: No Transportation Needs   Lack of Transportation (Medical): No   Lack of Transportation (Non-Medical): No  Physical Activity: Not on file  Stress: Not on file  Social Connections: Not on file  Intimate Partner Violence: Not on file    FAMILY HISTORY: Family History  Problem Relation Age of Onset   Alzheimer's disease Mother    Mental illness Mother    Depression Mother    Heart disease Father 77       had 72 MI's & died 26 yo   Hypertension Father    Alcohol abuse Son    Stroke Maternal Grandmother    Dementia Maternal Grandmother    Heart attack Son     ALLERGIES:  is allergic to other.  MEDICATIONS:  Current Outpatient Medications  Medication Sig Dispense Refill   acetaminophen (TYLENOL) 500 MG tablet Take 500 mg by mouth as needed for mild pain.     albuterol (PROVENTIL) (2.5 MG/3ML) 0.083% nebulizer solution Use   1 vial   4  x /day   or every 4 hours for Wheezing or Shortness of Breath (Patient taking differently: Use   1 vial   4 x /day   or every 4 hours for Wheezing or Shortness of Breath) 90 mL 0   allopurinol (ZYLOPRIM) 300 MG tablet TAKE 1 TABLET EVERY DAY TO PREVENT GOUT 90 tablet 3   ALPRAZolam (XANAX) 0.25 MG tablet Take 1 tablet (0.25 mg total) by mouth at bedtime as needed for sleep (only for use during sleep study or with PAP). 30 tablet  0   AMBULATORY NON FORMULARY MEDICATION Nitroglycerine ointment 0.125 % - Apply a pea sized amount to your rectum to the first knuckle three times daily. 30 g 3   aspirin EC 81 MG tablet Take 81 mg by mouth daily.     Cholecalciferol (VITAMIN D PO) Take 10,000 Units by mouth daily.     diphenhydrAMINE (BENADRYL) 25 MG tablet Take 25 mg by mouth at bedtime.     Docosahexaenoic Acid (DHA PO) Take 500 mg by mouth daily. + EPA 250 mg     docusate sodium (COLACE) 100 MG capsule Take 100 mg by mouth 2 (two) times daily. 1 tablet in the morning and 1 tablet at bedtime (stool softener)     doxazosin (CARDURA) 4 MG tablet TAKE 1 TABLET EVERY DAY 90 tablet 3   finasteride (PROSCAR) 5 MG tablet TAKE 1 TABLET EVERY DAY FOR PROSTATE 90 tablet 3   furosemide (LASIX) 40 MG tablet Take 40 mg by mouth 2 (two) times daily.     hydroxyurea (HYDREA) 500 MG capsule TAKE 2 CAPSULES ONE TIME DAILY WITH FOOD 180 capsule 0   ipratropium (ATROVENT) 0.06 % nasal spray Use 1 to 2 sprays each Nostril 3 x /day 45 mL 3   isosorbide mononitrate (IMDUR) 30 MG 24 hr tablet Take by mouth daily.     levothyroxine (SYNTHROID) 200 MCG tablet TAKE 1 TAB DAILY ON EMPTY STOMACH WITH ONLY WATER FOR 30 MINS. NO ANTACIDS, CALCIUM OR MAGNESIUM FOR 4 HOURS. AVOID BIOTIN. 90 tablet 3   losartan (COZAAR) 100 MG tablet Take 1 tablet (100 mg total) by mouth daily. 90 tablet 3   magnesium oxide (MAG-OX) 400 MG tablet Take by mouth.     MELATONIN PO Take 1 tablet by mouth as needed.     metoprolol (TOPROL-XL) 200 MG 24 hr  tablet Take 200 mg by mouth daily.     Multiple Vitamin (MULTIVITAMIN) tablet Take 1 tablet by mouth daily. A-Z     omeprazole (PRILOSEC) 20 MG capsule TAKE 1 CAPSULE TWICE DAILY  ( EVERY 12 HOURS  ) TO PREVENT HEARTBURN AND ACID REFLUX 180 capsule 3   OVER THE COUNTER MEDICATION OTC Biotin daily.     polyethylene glycol powder (GLYCOLAX/MIRALAX) 17 GM/SCOOP powder Take 17 g by mouth daily.     Probiotic Product (PROBIOTIC-10 PO) Take by mouth daily.     rosuvastatin (CRESTOR) 40 MG tablet TAKE 1 TABLET EVERY DAY FOR CHOLESTEROL 90 tablet 3   spironolactone (ALDACTONE) 25 MG tablet TAKE 1 TABLET EVERY DAY FOR BLOOD PRESSURE AND HEART FAILURE 90 tablet 3   TURMERIC PO Take 1,000 mg by mouth daily.     warfarin (COUMADIN) 5 MG tablet Take 1 to 2 tablets Daily as Directed (Patient taking differently: Take 1 to 2 tablets Daily as Directed (Taking 1.5 on Mondays)) 145 tablet 0   zinc gluconate 50 MG tablet Take 50 mg by mouth daily.     No current facility-administered medications for this visit.    REVIEW OF SYSTEMS:   .10 Point review of Systems was done is negative except as noted above.  PHYSICAL EXAMINATION: Vitals:   05/24/21 1120  BP: (!) 126/54  Pulse: 70  Resp: 18  Temp: 97.9 F (36.6 C)  SpO2: 98%    Filed Weights   05/24/21 1120  Weight: 238 lb 9.6 oz (108.2 kg)    .Body mass index is 37.37 kg/m.   .BP (!) 126/54    Pulse 70  Temp 97.9 F (36.6 C)    Resp 18    Wt 238 lb 9.6 oz (108.2 kg)    SpO2 98%    BMI 37.37 kg/m  . [place] Kitchen GENERAL:alert, in no acute distress and comfortable SKIN: no acute rashes, no significant lesions EYES: conjunctiva are pink and non-injected, sclera anicteric OROPHARYNX: MMM, no exudates, no oropharyngeal erythema or ulceration NECK: supple, no JVD LYMPH:  no palpable lymphadenopathy in the cervical, axillary or inguinal regions LUNGS: clear to auscultation b/l with normal respiratory effort HEART: regular rate & rhythm ABDOMEN:   normoactive bowel sounds , non tender, not distended. Extremity: no pedal edema PSYCH: alert & oriented x 3 with fluent speech NEURO: no focal motor/sensory deficits   LABORATORY DATA:  I have reviewed the data as listed  . CBC Latest Ref Rng & Units 05/24/2021 03/08/2021 02/21/2021  WBC 4.0 - 10.5 K/uL 9.6 12.3(H) 9.6  Hemoglobin 13.0 - 17.0 g/dL 15.9 16.1 15.5  Hematocrit 39.0 - 52.0 % 47.2 45.8 46.7  Platelets 150 - 400 K/uL 317 344 258    . CMP Latest Ref Rng & Units 05/24/2021 03/08/2021 02/21/2021  Glucose 70 - 99 mg/dL 113(H) 95 109(H)  BUN 8 - 23 mg/dL 22 20 21   Creatinine 0.61 - 1.24 mg/dL 1.14 1.13 1.22  Sodium 135 - 145 mmol/L 136 134(L) 136  Potassium 3.5 - 5.1 mmol/L 4.4 5.2 4.3  Chloride 98 - 111 mmol/L 100 97(L) 100  CO2 22 - 32 mmol/L 29 29 28   Calcium 8.9 - 10.3 mg/dL 9.7 9.6 9.3  Total Protein 6.5 - 8.1 g/dL 7.0 6.5 6.6  Total Bilirubin 0.3 - 1.2 mg/dL 1.4(H) 1.3(H) 1.6(H)  Alkaline Phos 38 - 126 U/L 51 - 59  AST 15 - 41 U/L 26 25 28   ALT 0 - 44 U/L 22 21 23     02/09/18 JAK2 Mutation Study:    02/09/18 BCR ABL FISH:    RADIOGRAPHIC STUDIES: I have personally reviewed the radiological images as listed and agreed with the findings in the report. No results found.  ASSESSMENT & PLAN:   81 y.o. male with  1. 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 Some associated leukocytosis. No history of splenectomy. No previous h/o VTE, but significant cardiac risk factors. 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   02/09/18 BCR ABL FISH revealed no evidence of BCR/ABL rearrangement   PLAN: -Patients labs done today were reviewed in details.  CBC and CMP are stable. Platelets are at  goal. Hematocrit less than 48% no indication for therapeutic phlebotomy at this time.  We are allowing slightly higher permissive hematocrits given the patient's other medical comorbidities and some overestimation of hematocrit based on hemoconcentration from diuretics. No prohibitive toxicities from hydroxyurea at 1000 mg p.o. daily Continue aspirin daily No issues with bleeding or VTE. We will see him back in 3 months with labs  2. . Patient Active Problem List   Diagnosis Date Noted   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   Intolerance of continuous positive airway pressure (CPAP) ventilation 05/02/2021   MPN (myeloproliferative neoplasm) ([place]) 08/27/2020   JAK2 [id number] mutation 08/27/2020   Rectal pain 11/10/2019   Constipation 11/10/2019   Abnormal glucose 10/12/2019   Memory changes 01/10/2019  Polycythemia vera ([place]) 10/21/2018   Thrombocytosis 12/29/2017   Senile purpura ([place]) 09/01/2017   Emphysema of lung ([place]) 06/01/2017   Tortuous aorta ([place])- CXR 2018 06/01/2017   ASHD/CABG x 3V (11/2013) 02/06/2015   Acquired thrombophilia ([place]) 02/06/2015   Cardiac pacemaker/AICD (01/2014) 02/06/2015   Morbid obesity ([place]) 01/03/2015   HTN (hypertension) 01/02/2015   Hyperlipidemia, mixed 01/02/2015   Vitamin D deficiency 01/02/2015   GERD  01/02/2015   BPH (benign prostatic hyperplasia) 01/02/2015   Medication management 01/02/2015   Atrial fibrillation ([place]) 01/02/2015   Hypothyroidism 01/02/2015   OSA and COPD overlap syndrome ([place]) 01/02/2015  -continue f/u with PCP to optimize atherosclerotic risk factors.    FOLLOW UP: RTC with Dr Irene Limbo with labs in 3 months  All of the patient's questions were answered with apparent satisfaction. The patient knows to call the clinic with any problems, questions or concerns.   Sullivan Lone MD [place] AAHIVMS [facility] Hematology/Oncology Physician [facility]

## 2021-06-05 DIAGNOSIS — I25810 Atherosclerosis of coronary artery bypass graft(s) without angina pectoris: Secondary | ICD-10-CM | POA: Diagnosis not present

## 2021-06-05 DIAGNOSIS — I480 Paroxysmal atrial fibrillation: Secondary | ICD-10-CM | POA: Diagnosis not present

## 2021-06-05 DIAGNOSIS — R0600 Dyspnea, unspecified: Secondary | ICD-10-CM | POA: Diagnosis not present

## 2021-06-05 DIAGNOSIS — Z95810 Presence of automatic (implantable) cardiac defibrillator: Secondary | ICD-10-CM | POA: Diagnosis not present

## 2021-06-11 DIAGNOSIS — Z7901 Long term (current) use of anticoagulants: Secondary | ICD-10-CM | POA: Diagnosis not present

## 2021-06-11 DIAGNOSIS — I480 Paroxysmal atrial fibrillation: Secondary | ICD-10-CM | POA: Diagnosis not present

## 2021-06-11 DIAGNOSIS — Z95810 Presence of automatic (implantable) cardiac defibrillator: Secondary | ICD-10-CM | POA: Diagnosis not present

## 2021-06-11 DIAGNOSIS — I255 Ischemic cardiomyopathy: Secondary | ICD-10-CM | POA: Diagnosis not present

## 2021-06-17 DIAGNOSIS — I10 Essential (primary) hypertension: Secondary | ICD-10-CM | POA: Diagnosis not present

## 2021-06-17 DIAGNOSIS — E039 Hypothyroidism, unspecified: Secondary | ICD-10-CM | POA: Diagnosis not present

## 2021-06-19 DIAGNOSIS — I255 Ischemic cardiomyopathy: Secondary | ICD-10-CM | POA: Diagnosis not present

## 2021-06-24 NOTE — Telephone Encounter (Signed)
Received a notification that the pt is cancelling his order to start therapy   "Received a message from [place] that this patient wanted to cancel his order. After looking through his notes it looks like he spoke to a [employer] He said that he just wanted to cancel his order if insurance isn't going to pay and he didn't want to use it anyway.   So [name] [name] has put the note in there that he would be covered at 100% when his deductible has been met.   He has Medicare and AARP"

## 2021-07-04 ENCOUNTER — Other Ambulatory Visit: Payer: Self-pay

## 2021-07-04 ENCOUNTER — Ambulatory Visit (INDEPENDENT_AMBULATORY_CARE_PROVIDER_SITE_OTHER): Payer: Medicare Other | Admitting: Internal Medicine

## 2021-07-04 ENCOUNTER — Encounter: Payer: Self-pay | Admitting: Internal Medicine

## 2021-07-04 VITALS — BP 112/78 | HR 71 | Temp 97.9°F | Resp 18 | Ht 67.0 in | Wt 238.8 lb

## 2021-07-04 DIAGNOSIS — D45 Polycythemia vera: Secondary | ICD-10-CM

## 2021-07-04 DIAGNOSIS — E559 Vitamin D deficiency, unspecified: Secondary | ICD-10-CM

## 2021-07-04 DIAGNOSIS — R7309 Other abnormal glucose: Secondary | ICD-10-CM | POA: Diagnosis not present

## 2021-07-04 DIAGNOSIS — Z8249 Family history of ischemic heart disease and other diseases of the circulatory system: Secondary | ICD-10-CM

## 2021-07-04 DIAGNOSIS — E782 Mixed hyperlipidemia: Secondary | ICD-10-CM

## 2021-07-04 DIAGNOSIS — Z1212 Encounter for screening for malignant neoplasm of rectum: Secondary | ICD-10-CM

## 2021-07-04 DIAGNOSIS — Z950 Presence of cardiac pacemaker: Secondary | ICD-10-CM

## 2021-07-04 DIAGNOSIS — E039 Hypothyroidism, unspecified: Secondary | ICD-10-CM

## 2021-07-04 DIAGNOSIS — Z125 Encounter for screening for malignant neoplasm of prostate: Secondary | ICD-10-CM

## 2021-07-04 DIAGNOSIS — M1000 Idiopathic gout, unspecified site: Secondary | ICD-10-CM

## 2021-07-04 DIAGNOSIS — R1312 Dysphagia, oropharyngeal phase: Secondary | ICD-10-CM

## 2021-07-04 DIAGNOSIS — I872 Venous insufficiency (chronic) (peripheral): Secondary | ICD-10-CM

## 2021-07-04 DIAGNOSIS — N401 Enlarged prostate with lower urinary tract symptoms: Secondary | ICD-10-CM | POA: Diagnosis not present

## 2021-07-04 DIAGNOSIS — I10 Essential (primary) hypertension: Secondary | ICD-10-CM

## 2021-07-04 DIAGNOSIS — Z79899 Other long term (current) drug therapy: Secondary | ICD-10-CM

## 2021-07-04 DIAGNOSIS — I2510 Atherosclerotic heart disease of native coronary artery without angina pectoris: Secondary | ICD-10-CM

## 2021-07-04 DIAGNOSIS — G4733 Obstructive sleep apnea (adult) (pediatric): Secondary | ICD-10-CM

## 2021-07-04 DIAGNOSIS — Z136 Encounter for screening for cardiovascular disorders: Secondary | ICD-10-CM

## 2021-07-04 DIAGNOSIS — Z1211 Encounter for screening for malignant neoplasm of colon: Secondary | ICD-10-CM

## 2021-07-04 DIAGNOSIS — I480 Paroxysmal atrial fibrillation: Secondary | ICD-10-CM

## 2021-07-04 MED ORDER — TORSEMIDE 20 MG PO TABS: ORAL_TABLET | ORAL | 3 refills | Status: DC

## 2021-07-04 MED ORDER — TAMSULOSIN HCL 0.4 MG PO CAPS
ORAL_CAPSULE | ORAL | 3 refills | Status: DC
Start: 2021-07-04 — End: 2022-04-30

## 2021-07-04 NOTE — Progress Notes (Signed)
Annual  Screening/Preventative Visit  & Comprehensive Evaluation & Examination  Future Appointments  Date Time Provider Department  07/04/2021                  CPE  2:00 PM [name], MD [facility]  07/23/2021  2:30 PM Newton Pigg, [facility]  08/19/2021  1:30 PM Dohmeier, Asencion Partridge, MD [facility]  09/04/2021  2:20 PM Brunetta Genera, MD [facility]  03/11/2022                Wellness 10:00 AM [name], NP [facility]  07/10/2022                 CPE  2:00 PM [name], MD [facility]            This very nice 81 y.o. MWM  presents for a Screening /Preventative Visit & comprehensive evaluation and management of multiple medical co-morbidities.  Patient has been followed for HTN, HLD, Prediabetes and Vitamin D Deficiency.  Patient has OSA/COPD overlap , but is intolerant to CPAP Masks. Patient is on Hydrea for PRV followed by Dr Irene Limbo . Patient has hx/o Gout & GERD controlled on meds.        HTN predates since  96 (age 35 yo ).  Patient's BP has been controlled at home.  Today's BP: 112/78.  In 2015, patient underwent CABG. Patient also has hx/pAfb & is on Coumadin followed  at his cardiology office in [facility]. In 2016, patient had a AICD/Defibrillator implanted. Cardiac echo in 2021 showed a stenotic bicuspid  Aortic valve. Patient denies any cardiac symptoms as chest pain, palpitations, shortness of breath, dizziness or ankle swelling.       Patient's hyperlipidemia is controlled with diet and medications. Patient denies myalgias or other medication SE's. Last lipids were at goal :  Lab Results  Component Value Date   CHOL 123 03/08/2021   HDL 21 (L) 03/08/2021   LDLCALC 80 03/08/2021   TRIG 119 03/08/2021   CHOLHDL 5.9 (H) 03/08/2021         Patient has Morbid Obesity (BMI 39+) and prediabetes (A1c 6.0% /2015) and patient denies reactive hypoglycemic symptoms, visual blurring, diabetic polys or paresthesias. Last A1c was normal & at goal :   Lab Results   Component Value Date   HGBA1C 5.3 11/29/2020          Finally, patient has history of Vitamin D Deficiency ("39" /2016) and last vitamin D was at goal :   Lab Results  Component Value Date   VD25OH 82 11/29/2020     Current Outpatient Medications on File Prior to Visit  Medication Sig   acetaminophen  500 MG tablet Take 500 mg by mouth as needed for mild pain.   albuterol (2.5 MG/3ML) 0.083% neb soln Use   1 vial   4 x /day   or every 4 hours for Wheezing or Shortness of Breath    allopurinol 300 MG tablet TAKE 1 TABLET EVERY DAY TO PREVENT GOUT   ALPRAZolam  0.25 MG tablet Take 1 tablet at bedtime as needed for sleep (only for use during sleep study or with PAP).   Nitroglycerine ointment 0.125 %  Apply a pea sized amount to your rectum to the first knuckle three times daily.   aspirin EC 81 MG tablet Take daily.   VITAMIN D  Take 10,000 Units  daily.   diphenhydrAMINE  25 MG tablet Take  at bedtime.   Docosahexaenoic Acid (DHA  PO)500 mg  Take daily. + EPA 250 mg   docusate  100 MG capsule Take 2  times daily.    Doxazosin 4 MG tablet TAKE 1 TABLET EVERY DAY   finasteride  5 MG tablet TAKE 1 TABLET EVERY DAY FOR PROSTATE   furosemide 40 MG tablet Take  2  times daily.   hydroxyurea 500 MG capsule TAKE 2 CAPSULES  DAILY    ipratropium 0.06 % nasal spray Use 1 to 2 sprays each Nostril 3 x /day   isosorbide mononitrate (IMDUR) 30 MG 24 hr  Take by mouth daily.   levothyroxine 200 MCG tablet TAKE 1 TAB DAILY    losartan (COZAAR) 100 MG tablet Take 1 tablet daily.   magnesium 400 MG tablet Take daily   MELATONIN  Take 1 tablet as needed.   metoprolol-XL 200 MG 24 hr tablet Take 200 mg  daily.   Multiple Vitamin (MULTIVITAMIN) tablet Take 1 tablet daily. A-Z   omeprazole 20 MG capsule TAKE 1 CAPSULE TWICE DAILY     OTC Biotin  daily.   polyethylene glycol powder 17 GM/SCOOP  Take 17 g daily.   PROBIOTIC-10  Take daily.   rosuvastatin 40 MG tablet TAKE 1 TABLET EVERY DAY FOR  CHOLESTEROL   spironolactone 25 MG tablet TAKE 1 TABLET EVERY DAY FOR BLOOD PRESSURE AND HEART FAILURE   TURMERIC 1,000 mg Take   daily.   warfarin 5 MG tablet Take 1 to 2 tablets Daily as Directed   zinc  50 MG tablet Take 50 mg by mouth daily.    Allergies  Allergen Reactions   Other Swelling    SODIUM SENSITIVE    Past Medical History:  Diagnosis Date   A-fib ([place]) 01/2014   AICD (automatic cardioverter/defibrillator) present    [employer]   Anal fissure 11/10/2019   Arthritis    ASHD (arteriosclerotic heart disease) 2015   s/p CABG   Asthma    CAD (coronary artery disease)    Cataract    s/p left   CHF (congestive heart failure) (HCC)    COPD (chronic obstructive pulmonary disease) ([place])    by CXR, pt unsure of this   Diverticulosis    GERD (gastroesophageal reflux disease)    Gout 2014   Hyperlipidemia    Hypertension    Hypothyroidism    LBBB (left bundle branch block)    Polycythemia    Pre-diabetes    Prediabetes 01/2014   Presence of permanent cardiac pacemaker    Sleep apnea    no CPAP   Thyroid disease    hypothyroid     Health Maintenance  Topic Date Due   Zoster Vaccines- Shingrix (1 of 2) Never done   COVID-19 Vaccine (3 - Moderna risk series) 11/08/2020   TETANUS/TDAP  03/08/2022 (Originally 09/25/1959)   Pneumonia Vaccine 72+ Years old  Completed   INFLUENZA VACCINE  Completed   HPV VACCINES  Aged Out     Immunization History  Administered Date(s) Administered   Influenza, High Dose  03/29/2018, 02/12/2019, 03/08/2021   Influenza,inj,Quad  01/31/2016   Moderna SARS-COV2 Booster Vacc 03/14/2020, 10/11/2020   Moderna Sars-Covid-2 Vacci 06/18/2019, 07/18/2019   Pneumococcal -13 11/21/2014   Pneumococcal -23 06/01/2017    Last Colon - 11/11/2006 - Dr Fuller Plan recc   f/u &  sent a recall letter in 2018   Cologard 07/08/2020 -Negative - & recc a 3 year f/u due June 2025  Past Surgical History:  Procedure Laterality Date  CARDIAC  DEFIBRILLATOR PLACEMENT  07/2014   CATARACT EXTRACTION W/ INTRAOCULAR LENS IMPLANT Left    CORONARY ARTERY BYPASS GRAFT  11/2013   lumb laminectomy   x 3  [id number]      LUMBAR FUSION  2013   NASAL SEPTOPLASTY W/ TURBINOPLASTY Bilateral 01/30/2016   NASAL SEPTOPLASTY WITH BILATERAL TURBINATE REDUCTION;  Rozetta Nunnery, MD   TONSILLECTOMY     UVULECTOMY N/A 01/30/2016   UVULECTOMY; Rozetta Nunnery, MD     Family History  Problem Relation Age of Onset   Alzheimer's disease Mother    Mental illness Mother    Depression Mother    Heart disease Father 33       had 51 MI's & died 24 yo   Hypertension Father    Alcohol abuse Son    Stroke Maternal Grandmother    Dementia Maternal Grandmother    Heart attack Son      Social History   Socioeconomic History   Marital status: Married      Spouse name: [name]    Number of children: 2   Years of education: 16  Occupational History   Occupation: Retired   Tobacco Use   Smoking status: Former    Packs/day: 2.00    Years: 25.00    Pack years: 50.00    Types: Cigarettes    Quit date: 05/20/1983    Years since quitting: 38.1   Smokeless tobacco: Never  Vaping Use   Vaping Use: Never used  Substance Use Topics   Alcohol use: Not Currently    Alcohol/week: 0.0 standard drinks   Drug use: No      ROS Constitutional: Denies fever, chills, weight loss/gain, headaches, insomnia,  night sweats or change in appetite. Does c/o fatigue. Eyes: Denies redness, blurred vision, diplopia, discharge, itchy or watery eyes.  ENT: Denies discharge, congestion, post nasal drip, epistaxis, sore throat, earache, hearing loss, dental pain, Tinnitus, Vertigo, Sinus pain or snoring.  Cardio: Denies chest pain, palpitations, irregular heartbeat, syncope, dyspnea, diaphoresis, orthopnea, PND, claudication or edema Respiratory: denies cough, dyspnea, DOE, pleurisy, hoarseness, laryngitis or wheezing.  Gastrointestinal: Denies dysphagia,  heartburn, reflux, water brash, pain, cramps, nausea, vomiting, bloating, diarrhea, constipation, hematemesis, melena, hematochezia, jaundice or hemorrhoids Genitourinary: Denies dysuria, frequency, urgency, nocturia, hesitancy, discharge, hematuria or flank pain Musculoskeletal: Denies arthralgia, myalgia, stiffness, Jt. Swelling, pain, limp or strain/sprain. Denies Falls. Skin: Denies puritis, rash, hives, warts, acne, eczema or change in skin lesion Neuro: No weakness, tremor, incoordination, spasms, paresthesia or pain Psychiatric: Denies confusion, memory loss or sensory loss. Denies Depression. Endocrine: Denies change in weight, skin, hair change, nocturia, and paresthesia, diabetic polys, visual blurring or hyper / hypo glycemic episodes.  Heme/Lymph: No excessive bleeding, bruising or enlarged lymph nodes.   Physical Exam  BP 112/78    Pulse 71    Temp 97.9 F (36.6 C)    Resp 18    Ht 5\' 7"  (1.702 m)    Wt 238 lb 12.8 oz (108.3 kg)    SpO2 96%    BMI 37.40 kg/m   General Appearance: Over nourished and well groomed and in no apparent distress.  Eyes: PERRLA, EOMs, conjunctiva no swelling or erythema, normal fundi and vessels. Sinuses: No frontal/maxillary tenderness ENT/Mouth: EACs patent / TMs  nl. Nares clear without erythema, swelling, mucoid exudates. Oral hygiene is good. No erythema, swelling, or exudate. Tongue normal, non-obstructing. Tonsils not swollen or erythematous. Hearing normal.  Neck: Supple, thyroid not palpable. No bruits, nodes  or JVD. Respiratory: Respiratory effort normal.  BS equal and clear bilateral without rales, rhonci, wheezing or stridor. Cardio: Heart sounds are normal with regular rate and rhythm and no murmurs, rubs or gallops. Peripheral pulses are normal and equal bilaterally with 2-3 (+) ankle edema. No aortic or femoral bruits. Chest: symmetric with normal excursions and percussion.  Abdomen: Soft, with Nl bowel sounds. Nontender, no guarding,  rebound, hernias, masses, or organomegaly.  Lymphatics: Non tender without lymphadenopathy.  Musculoskeletal: Full ROM all peripheral extremities, joint stability, 5/5 strength, and normal gait. Skin: Warm and dry without rashes, lesions, cyanosis, clubbing or  ecchymosis.  Neuro: Cranial nerves intact, reflexes equal bilaterally. Normal muscle tone, no cerebellar symptoms. Sensation intact.  Pysch: Alert and oriented x  3 with normal affect. Short term memory recall seems poor.                                                                                                                                                                                            insight and judgment appropriate.   Assessment and Plan  1. Essential hypertension  - EKG 12-Lead - Urinalysis, Routine w reflex microscopic - Microalbumin / creatinine urine ratio - CBC with Differential/Platelet - COMPLETE METABOLIC PANEL WITH GFR - Magnesium - TSH  2. Hyperlipidemia, mixed  - EKG 12-Lead - Lipid panel - TSH  3. Abnormal glucose  - EKG 12-Lead - Hemoglobin A1c - Insulin, random  4. Vitamin D deficiency  - VITAMIN D 25 Hydroxy   5. Polycythemia vera ([place])   6. Hypothyroidism   7. ASHD/CABG x 3V (11/2013)  - EKG 12-Lead  8. Cardiac pacemaker/AICD (01/2014)  - EKG 12-Lead  9. Paroxysmal atrial fibrillation (HCC)  - EKG 12-Lead  10. OSA and COPD overlap syndrome ([place])   11. Oropharyngeal dysphagia  - Ambulatory referral to Gastroenterology  12. Idiopathic gout  - Uric acid  13. Benign prostatic hyperplasia with lower urinary  tract symptoms, symptom details unspecified  - PSA - tamsulosin  0.4 MG CAPS capsule; Take 1 tablet at Bedtime for Prostate & Bladder   Dispense: 90 capsule; Refill: 3  14. Screening for colorectal cancer  - POC Hemoccult Bld/Stl  15. Screening for AAA (aortic abdominal aneurysm)  - EKG 12-Lead  16. Prostate cancer screening  - PSA  17.  Screening for ischemic heart disease  - EKG 12-Lead  18. FHx: heart disease  - EKG 12-Lead  19. Medication management  - Urinalysis, Routine w reflex microscopic - Microalbumin / creatinine urine ratio - CBC with Differential/Platelet - COMPLETE METABOLIC PANEL WITH GFR - Magnesium - Lipid panel - TSH -  Hemoglobin A1c - Insulin, random - VITAMIN D 25 Hydroxy  20. Edema of lower extremity due to peripheral venous insufficiency  - D/C Lasix  - torsemide 20 MG tablet;  Take 1 tablet every Morning for Swelling in legs   Dispense: 90 tablet; Refill: 3          Patient was counseled in prudent diet, weight control to achieve/maintain BMI less than 25, BP monitoring, regular exercise and medications as discussed.  Discussed med effects and SE's. Routine screening labs and tests as requested with regular follow-up as recommended. Over 40 minutes of exam, counseling, chart review and high complex critical decision making was performed   Kirtland Bouchard, MD

## 2021-07-04 NOTE — Patient Instructions (Addendum)
Due to recent changes in healthcare laws, you may see the results of your imaging and laboratory studies on MyChart before your provider has had a chance to review them.  We understand that in some cases there may be results that are confusing or concerning to you. Not all laboratory results come back in the same time frame and the provider may be waiting for multiple results in order to interpret others.  Please give [place] 48 hours in order for your provider to thoroughly review all the results before contacting the office for clarification of your results.  +++++++++++++++++++++++++++++++   Stop Lasix  ( Furosemide)  - Replacing with Demadex  (Torsemide)  for Edema / Swelling in Legs  Adding Flomax ( Tamsulosin ) to take at Bedtime for Prostate emptying  Gastroenterology Referral requested for swallowing /choking   +++++++++++++++++++++++++++++++  Vit D  & Vit C 1,000 mg   are recommended to help protect  against the Covid-19 and other Corona viruses.    Also it's recommended  to take  Zinc 50 mg  to help  protect against the Covid-19   and best place to get  is also on [email or web address]  and don't pay more than 6-8 cents /pill !  ================================ Coronavirus (COVID-19) Are you at risk?  Are you at risk for the Coronavirus (COVID-19)?  To be considered HIGH RISK for Coronavirus (COVID-19), you have to meet the following criteria:  Traveled to [place], [place], [place], [place] or [place]; or in the [place] to [place], [place], [place]  or [place]; and have fever, cough, and shortness of breath within the last 2 weeks of travel OR Been in close contact with a person diagnosed with COVID-19 within the last 2 weeks and have  fever, cough,and shortness of breath  IF YOU DO NOT MEET THESE CRITERIA, YOU ARE CONSIDERED LOW RISK FOR COVID-19.  What to do if you are HIGH RISK for COVID-19?  If you are having a medical emergency, call 911. Seek medical care right  away. Before you go to a doctors office, urgent care or emergency department,  call ahead and tell them about your recent travel, contact with someone diagnosed with COVID-19   and your symptoms.  You should receive instructions from your physicians office regarding next steps of care.  When you arrive at healthcare provider, tell the healthcare staff immediately you have returned from  visiting [place], [place], [place], [place] or [place]; or traveled in the [place] to [place], [place],  [place] or [place] in the last two weeks or you have been in close contact with a person diagnosed with  COVID-19 in the last 2 weeks.   Tell the health care staff about your symptoms: fever, cough and shortness of breath. After you have been seen by a medical provider, you will be either: Tested for (COVID-19) and discharged home on quarantine except to seek medical care if  symptoms worsen, and asked to  Stay home and avoid contact with others until you get your results (4-5 days)  Avoid travel on public transportation if possible (such as bus, train, or airplane) or Sent to the Emergency Department by EMS for evaluation, COVID-19 testing  and  possible admission depending on your condition and test results.  What to do if you are LOW RISK for COVID-19?  Reduce your risk of any infection by using the same precautions used for avoiding the common cold or flu:  Wash your hands  often with soap and warm water for at least 20 seconds.  If soap and water are not readily available,  use an alcohol-based hand sanitizer with at least 60% alcohol.  If coughing or sneezing, cover your mouth and nose by coughing or sneezing into the elbow areas of your shirt or coat,  into a tissue or into your sleeve (not your hands). Avoid shaking hands with others and consider head nods or verbal greetings only. Avoid touching your eyes, nose, or mouth with unwashed hands.  Avoid close contact with people who are  sick. Avoid places or events with large numbers of people in one location, like concerts or sporting events. Carefully consider travel plans you have or are making. If you are planning any travel outside or inside the [place], visit the [facility] webpage for the latest health notices. If you have some symptoms but not all symptoms, continue to monitor at home and seek medical attention  if your symptoms worsen. If you are having a medical emergency, call 911. >>>>>>>>>>>>>>>>>>>>>>>>>>>>>>>>>>>>>>>>>>>>>>>>>>> We Do NOT Approve of [employer] > > > > > > > > > > > > > > > > > > > > > > > > > > > > > > > > > > >  > >    Preventive Care for Adults  A healthy lifestyle and preventive care can promote health and wellness. Preventive health guidelines for men include the following key practices: A routine yearly physical is a good way to check with your health care provider about your health and preventative screening. It is a chance to share any concerns and updates on your health and to receive a thorough exam. Visit your dentist for a routine exam and preventative care every 6 months. Brush your teeth twice a day and floss once a day. Good oral hygiene prevents tooth decay and gum disease. The frequency of eye exams is based on your age, health, family medical history, use of contact lenses, and other factors. Follow your health care provider's recommendations for frequency of eye exams. Eat a healthy diet. Foods such as vegetables, fruits, whole grains, low-fat dairy products, and lean protein foods contain the nutrients you need without too many calories. Decrease your intake of foods high in solid fats, added sugars, and salt. Eat the right amount of calories for you. Get information about a proper diet from your health care provider, if necessary. Regular physical exercise is one of the most important things you can do for your health. Most adults should get at least 150 minutes  of moderate-intensity exercise (any activity that increases your heart rate and causes you to sweat) each week. In addition, most adults need muscle-strengthening exercises on 2 or more days a week. Maintain a healthy weight. The body mass index (BMI) is a screening tool to identify possible weight problems. It provides an estimate of body fat based on height and weight. Your health care provider can find your BMI and can help you achieve or maintain a healthy weight. For adults 20 years and older: A BMI below 18.5 is considered underweight. A BMI of 18.5 to 24.9 is normal. A BMI of 25 to 29.9 is considered overweight. A BMI of 30 and above is considered obese. Maintain normal blood lipids and cholesterol levels by exercising and minimizing your intake of saturated fat. Eat a balanced diet with plenty of fruit and vegetables. Blood tests for lipids and cholesterol should begin at age 34 and  be repeated every 5 years. If your lipid or cholesterol levels are high, you are over 50, or you are at high risk for heart disease, you may need your cholesterol levels checked more frequently. Ongoing high lipid and cholesterol levels should be treated with medicines if diet and exercise are not working. If you smoke, find out from your health care provider how to quit. If you do not use tobacco, do not start. Lung cancer screening is recommended for adults aged 31-80 years who are at high risk for developing lung cancer because of a history of smoking. A yearly low-dose CT scan of the lungs is recommended for people who have at least a 30-pack-year history of smoking and are a current smoker or have quit within the past 15 years. A pack year of smoking is smoking an average of 1 pack of cigarettes a day for 1 year (for example: 1 pack a day for 30 years or 2 packs a day for 15 years). Yearly screening should continue until the smoker has stopped smoking for at least 15 years. Yearly screening should be stopped for  people who develop a health problem that would prevent them from having lung cancer treatment. If you choose to drink alcohol, do not have more than 2 drinks per day. One drink is considered to be 12 ounces (355 mL) of beer, 5 ounces (148 mL) of wine, or 1.5 ounces (44 mL) of liquor. Avoid use of street drugs. Do not share needles with anyone. Ask for help if you need support or instructions about stopping the use of drugs. High blood pressure causes heart disease and increases the risk of stroke. Your blood pressure should be checked at least every 1-2 years. Ongoing high blood pressure should be treated with medicines, if weight loss and exercise are not effective. If you are 51-92 years old, ask your health care provider if you should take aspirin to prevent heart disease. Diabetes screening involves taking a blood sample to check your fasting blood sugar level. Testing should be considered at a younger age or be carried out more frequently if you are overweight and have at least 1 risk factor for diabetes. Colorectal cancer can be detected and often prevented. Most routine colorectal cancer screening begins at the age of 31 and continues through age 5. However, your health care provider may recommend screening at an earlier age if you have risk factors for colon cancer. On a yearly basis, your health care provider may provide home test kits to check for hidden blood in the stool. Use of a small camera at the end of a tube to directly examine the colon (sigmoidoscopy or colonoscopy) can detect the earliest forms of colorectal cancer. Talk to your health care provider about this at age 8, when routine screening begins. Direct exam of the colon should be repeated every 5-10 years through age 75, unless early forms of precancerous polyps or small growths are found. Hepatitis C blood testing is recommended for all people born from [birth year] through 1965 and any individual with known risks for hepatitis  C. Screening for abdominal aortic aneurysm (AAA)  by ultrasound is recommended for people who have history of high blood pressure or who are current or former smokers. Healthy men should  receive prostate-specific antigen (PSA) blood tests as part of routine cancer screening. Talk with your health care provider about prostate cancer screening. Testicular cancer screening is  recommended for adult males. Screening includes self-exam, a health care provider exam,  and other screening tests. Consult with your health care provider about any symptoms you have or any concerns you have about testicular cancer. Use sunscreen. Apply sunscreen liberally and repeatedly throughout the day. You should seek shade when your shadow is shorter than you. Protect yourself by wearing long sleeves, pants, a wide-brimmed hat, and sunglasses year round, whenever you are outdoors. Once a month, do a whole-body skin exam, using a mirror to look at the skin on your back. Tell your health care provider about new moles, moles that have irregular borders, moles that are larger than a pencil eraser, or moles that have changed in shape or color. Stay current with required vaccines (immunizations). Influenza vaccine. All adults should be immunized every year. Tetanus, diphtheria, and acellular pertussis (Td, Tdap) vaccine. An adult who has not previously received Tdap or who does not know his vaccine status should receive 1 dose of Tdap. This initial dose should be followed by tetanus and diphtheria toxoids (Td) booster doses every 10 years. Adults with an unknown or incomplete history of completing a 3-dose immunization series with Td-containing vaccines should begin or complete a primary immunization series including a Tdap dose. Adults should receive a Td booster every 10 years. Zoster vaccine. One dose is recommended for adults aged 45 years or older unless certain conditions are present.  PREVNAR - Pneumococcal 13-valent conjugate  (PCV13) vaccine. When indicated, a person who is uncertain of his immunization history and has no record of immunization should receive the PCV13 vaccine. An adult aged 69 years or older who has certain medical conditions and has not been previously immunized should receive 1 dose of PCV13 vaccine. This PCV13 should be followed with a dose of pneumococcal polysaccharide (PPSV23) vaccine. The PPSV23 vaccine dose should be obtained 1 or more year(s)after the dose of PCV13 vaccine. An adult aged 6 years or older who has certain medical conditions and previously received 1 or more doses of PPSV23 vaccine should receive 1 dose of PCV13. The PCV13 vaccine dose should be obtained 1 or more years after the last PPSV23 vaccine dose.  PNEUMOVAX - Pneumococcal polysaccharide (PPSV23) vaccine. When PCV13 is also indicated, PCV13 should be obtained first. All adults aged 28 years and older should be immunized. An adult younger than age 43 years who has certain medical conditions should be immunized. Any person who resides in a nursing home or long-term care facility should be immunized. An adult smoker should be immunized. People with an immunocompromised condition and certain other conditions should receive both PCV13 and PPSV23 vaccines. People with human immunodeficiency virus (HIV) infection should be immunized as soon as possible after diagnosis. Immunization during chemotherapy or radiation therapy should be avoided. Routine use of PPSV23 vaccine is not recommended for American Indians, [place] Natives, or people younger than 65 years unless there are medical conditions that require PPSV23 vaccine. When indicated, people who have unknown immunization and have no record of immunization should receive PPSV23 vaccine. One-time revaccination 5 years after the first dose of PPSV23 is recommended for people aged 19-64 years who have chronic kidney failure, nephrotic syndrome, asplenia, or immunocompromised conditions. People  who received 1-2 doses of PPSV23 before age 70 years should receive another dose of PPSV23 vaccine at age 42 years or later if at least 5 years have passed since the previous dose. Doses of PPSV23 are not needed for people immunized with PPSV23 at or after age 59 years.  Hepatitis A vaccine. Adults who wish to be protected from this  disease, have certain high-risk conditions, work with hepatitis A-infected animals, work in hepatitis A research labs, or travel to or work in countries with a high rate of hepatitis A should be immunized. Adults who were previously unvaccinated and who anticipate close contact with an international adoptee during the first 60 days after arrival in the [place] from a country with a high rate of hepatitis A should be immunized.  Hepatitis B vaccine. Adults should be immunized if they wish to be protected from this disease, have certain high-risk conditions, may be exposed to blood or other infectious body fluids, are household contacts or sex partners of hepatitis B positive people, are clients or workers in certain care facilities, or travel to or work in countries with a high rate of hepatitis B.  Preventive Service / Frequency  Ages 80 and over Blood pressure check. Lipid and cholesterol check. Lung cancer screening. / Every year if you are aged 30-80 years and have a 30-pack-year history of smoking and currently smoke or have quit within the past 15 years. Yearly screening is stopped once you have quit smoking for at least 15 years or develop a health problem that would prevent you from having lung cancer treatment. Fecal occult blood test (FOBT) of stool. You may not have to do this test if you get a colonoscopy every 10 years. Flexible sigmoidoscopy** or colonoscopy.** / Every 5 years for a flexible sigmoidoscopy or every 10 years for a colonoscopy beginning at age 20 and continuing until age 41. Hepatitis C blood test.** / For all people born from [birth year] through  1965 and any individual with known risks for hepatitis C. Abdominal aortic aneurysm (AAA) screening./ Screening current or former smokers or have Hypertension. Skin self-exam. / Monthly. Influenza vaccine. / Every year. Tetanus, diphtheria, and acellular pertussis (Tdap/Td) vaccine.** / 1 dose of Td every 10 years.  Zoster vaccine.** / 1 dose for adults aged 10 years or older.         Pneumococcal 13-valent conjugate (PCV13) vaccine.   Pneumococcal polysaccharide (PPSV23) vaccine.   Hepatitis A vaccine.** / Consult your health care provider. Hepatitis B vaccine.** / Consult your health care provider. Screening for abdominal aortic aneurysm (AAA)  by ultrasound is recommended for people who have history of high blood pressure or who are current or former smokers. ++++++++++ Recommend Adult Low Dose Aspirin or  coated  Aspirin 81 mg daily  To reduce risk of Colon Cancer 40 %,  Skin Cancer 26 % ,  Malignant Melanoma 46%  and  Pancreatic cancer 60% ++++++++++++++++++++++ Vitamin D goal  is between 70-100.  Please make sure that you are taking your Vitamin D as directed.  It is very important as a natural anti-inflammatory  helping hair, skin, and nails, as well as reducing stroke and heart attack risk.  It helps your bones and helps with mood. It also decreases numerous cancer risks so please take it as directed.  Low Vit D is associated with a 200-300% higher risk for CANCER  and 200-300% higher risk for HEART   ATTACK  &  STROKE.   .....................................Marland Kitchen It is also associated with higher death rate at younger ages,  autoimmune diseases like Rheumatoid arthritis, Lupus, Multiple Sclerosis.    Also many other serious conditions, like depression, Alzheimer's Dementia, infertility, muscle aches, fatigue, fibromyalgia - just to name a few. ++++++++++++++++++++++ Recommend the book "The END of DIETING" by Dr Excell Seltzer  & the book "The END of DIABETES " by  Dr Excell Seltzer At [email or web address] - get book & Audio CD's    Being diabetic has a  300% increased risk for heart attack, stroke, cancer, and alzheimer- type vascular dementia. It is very important that you work harder with diet by avoiding all foods that are white. Avoid white rice (brown & wild rice is OK), white potatoes (sweetpotatoes in moderation is OK), White bread or wheat bread or anything made out of white flour like bagels, donuts, rolls, buns, biscuits, cakes, pastries, cookies, pizza crust, and pasta (made from white flour & egg whites) - vegetarian pasta or spinach or wheat pasta is OK. Multigrain breads like Arnold's or Pepper[employer], or multigrain sandwich thins or flatbreads.  Diet, exercise and weight loss can reverse and cure diabetes in the early stages.  Diet, exercise and weight loss is very important in the control and prevention of complications of diabetes which affects every system in your body, ie. Brain - dementia/stroke, eyes - glaucoma/blindness, heart - heart attack/heart failure, kidneys - dialysis, stomach - gastric paralysis, intestines - malabsorption, nerves - severe painful neuritis, circulation - gangrene & loss of a leg(s), and finally cancer and Alzheimers.    I recommend avoid fried & greasy foods,  sweets/candy, white rice (brown or wild rice or Quinoa is OK), white potatoes (sweet potatoes are OK) - anything made from white flour - bagels, doughnuts, rolls, buns, biscuits,white and wheat breads, pizza crust and traditional pasta made of white flour & egg white(vegetarian pasta or spinach or wheat pasta is OK).  Multi-grain bread is OK - like multi-grain flat bread or sandwich thins. Avoid alcohol in excess. Exercise is also important.    Eat all the vegetables you want - avoid meat, especially red meat and dairy - especially cheese.  Cheese is the most concentrated form of trans-fats which is the worst thing to clog up our arteries. Veggie cheese is OK which can be found in the  fresh produce section at [employer] or [employer] or [employer]  ++++++++++++++++++++++ DASH Eating Plan  DASH stands for "Dietary Approaches to Stop Hypertension."   The DASH eating plan is a healthy eating plan that has been shown to reduce high blood pressure (hypertension). Additional health benefits may include reducing the risk of type 2 diabetes mellitus, heart disease, and stroke. The DASH eating plan may also help with weight loss. WHAT DO I NEED TO KNOW ABOUT THE DASH EATING PLAN? For the DASH eating plan, you will follow these general guidelines: Choose foods with a percent daily value for sodium of less than 5% (as listed on the food label). Use salt-free seasonings or herbs instead of table salt or sea salt. Check with your health care provider or pharmacist before using salt substitutes. Eat lower-sodium products, often labeled as "lower sodium" or "no salt added." Eat fresh foods. Eat more vegetables, fruits, and low-fat dairy products. Choose whole grains. Look for the word "whole" as the first word in the ingredient list. Choose fish  Limit sweets, desserts, sugars, and sugary drinks. Choose heart-healthy fats. Eat veggie cheese  Eat more home-cooked food and less restaurant, buffet, and fast food. Limit fried foods. Cook foods using methods other than frying. Limit canned vegetables. If you do use them, rinse them well to decrease the sodium. When eating at a restaurant, ask that your food be prepared with less salt, or no salt if possible.  WHAT FOODS CAN I EAT? Read Dr Fara Olden Fuhrman's books on The End of Dieting & The End of Diabetes  Grains Whole grain or whole wheat bread. Brown rice. Whole grain or whole wheat pasta. Quinoa, bulgur, and whole grain cereals. Low-sodium cereals. Corn or whole wheat flour tortillas. Whole grain cornbread. Whole grain crackers. Low-sodium crackers.  Vegetables Fresh or frozen vegetables (raw, steamed,  roasted, or grilled). Low-sodium or reduced-sodium tomato and vegetable juices. Low-sodium or reduced-sodium tomato sauce and paste. Low-sodium or reduced-sodium canned vegetables.   Fruits All fresh, canned (in natural juice), or frozen fruits.  Protein Products  All fish and seafood.  Dried beans, peas, or lentils. Unsalted nuts and seeds. Unsalted canned beans.  Dairy Low-fat dairy products, such as skim or 1% milk, 2% or reduced-fat cheeses, low-fat ricotta or cottage cheese, or plain low-fat yogurt. Low-sodium or reduced-sodium cheeses.  Fats and Oils Tub margarines without trans fats. Light or reduced-fat mayonnaise and salad dressings (reduced sodium). Avocado. Safflower, olive, or canola oils. Natural peanut or almond butter.  Other Unsalted popcorn and pretzels. The items listed above may not be a complete list of recommended foods or beverages. Contact your dietitian for more options.  ++++++++++++++++++++  WHAT FOODS ARE NOT RECOMMENDED? Grains/ White flour or wheat flour White bread. White pasta. White rice. Refined cornbread. Bagels and croissants. Crackers that contain trans fat.  Vegetables  Creamed or fried vegetables. Vegetables in a . Regular canned vegetables. Regular canned tomato sauce and paste. Regular tomato and vegetable juices.  Fruits Dried fruits. Canned fruit in light or heavy syrup. Fruit juice.  Meat and Other Protein Products Meat in general - RED meat & White meat.  Fatty cuts of meat. Ribs, chicken wings, all processed meats as bacon, sausage, bologna, salami, fatback, hot dogs, bratwurst and packaged luncheon meats.  Dairy Whole or 2% milk, cream, half-and-half, and cream cheese. Whole-fat or sweetened yogurt. Full-fat cheeses or blue cheese. Non-dairy creamers and whipped toppings. Processed cheese, cheese spreads, or cheese curds.  Condiments Onion and garlic salt, seasoned salt, table salt, and sea salt. Canned and packaged gravies.  Worcestershire sauce. Tartar sauce. Barbecue sauce. Teriyaki sauce. Soy sauce, including reduced sodium. Steak sauce. Fish sauce. Oyster sauce. Cocktail sauce. Horseradish. Ketchup and mustard. Meat flavorings and tenderizers. Bouillon cubes. Hot sauce. Tabasco sauce. Marinades. Taco seasonings. Relishes.  Fats and Oils Butter, stick margarine, lard, shortening and bacon fat. Coconut, palm kernel, or palm oils. Regular salad dressings.  Pickles and olives. Salted popcorn and pretzels.  The items listed above may not be a complete list of foods and beverages to avoid.

## 2021-07-05 ENCOUNTER — Other Ambulatory Visit: Payer: Self-pay | Admitting: Internal Medicine

## 2021-07-05 DIAGNOSIS — E039 Hypothyroidism, unspecified: Secondary | ICD-10-CM

## 2021-07-05 LAB — TSH: TSH: 0.25 mIU/L — ABNORMAL LOW (ref 0.40–4.50)

## 2021-07-05 LAB — CBC WITH DIFFERENTIAL/PLATELET
Absolute Monocytes: 374 cells/uL (ref 200–950)
Basophils Absolute: 58 cells/uL (ref 0–200)
Basophils Relative: 0.6 %
Eosinophils Absolute: 67 cells/uL (ref 15–500)
Eosinophils Relative: 0.7 %
HCT: 41.4 % (ref 38.5–50.0)
Hemoglobin: 14.7 g/dL (ref 13.2–17.1)
Lymphs Abs: 1238 cells/uL (ref 850–3900)
MCH: 37.9 pg — ABNORMAL HIGH (ref 27.0–33.0)
MCHC: 35.5 g/dL (ref 32.0–36.0)
MCV: 106.7 fL — ABNORMAL HIGH (ref 80.0–100.0)
MPV: 10.3 fL (ref 7.5–12.5)
Monocytes Relative: 3.9 %
Neutro Abs: 7862 cells/uL — ABNORMAL HIGH (ref 1500–7800)
Neutrophils Relative %: 81.9 %
Platelets: 321 10*3/uL (ref 140–400)
RBC: 3.88 10*6/uL — ABNORMAL LOW (ref 4.20–5.80)
RDW: 13 % (ref 11.0–15.0)
Total Lymphocyte: 12.9 %
WBC: 9.6 10*3/uL (ref 3.8–10.8)

## 2021-07-05 LAB — URINALYSIS, ROUTINE W REFLEX MICROSCOPIC
Bilirubin Urine: NEGATIVE
Glucose, UA: NEGATIVE
Hgb urine dipstick: NEGATIVE
Ketones, ur: NEGATIVE
Leukocytes,Ua: NEGATIVE
Nitrite: NEGATIVE
Protein, ur: NEGATIVE
Specific Gravity, Urine: 1.007 (ref 1.001–1.035)
pH: 7 (ref 5.0–8.0)

## 2021-07-05 LAB — COMPLETE METABOLIC PANEL WITH GFR
AG Ratio: 2 (calc) (ref 1.0–2.5)
ALT: 25 U/L (ref 9–46)
AST: 26 U/L (ref 10–35)
Albumin: 4.2 g/dL (ref 3.6–5.1)
Alkaline phosphatase (APISO): 49 U/L (ref 35–144)
BUN: 20 mg/dL (ref 7–25)
CO2: 31 mmol/L (ref 20–32)
Calcium: 9.4 mg/dL (ref 8.6–10.3)
Chloride: 99 mmol/L (ref 98–110)
Creat: 1.03 mg/dL (ref 0.70–1.22)
Globulin: 2.1 g/dL (calc) (ref 1.9–3.7)
Glucose, Bld: 98 mg/dL (ref 65–99)
Potassium: 5 mmol/L (ref 3.5–5.3)
Sodium: 135 mmol/L (ref 135–146)
Total Bilirubin: 1.3 mg/dL — ABNORMAL HIGH (ref 0.2–1.2)
Total Protein: 6.3 g/dL (ref 6.1–8.1)
eGFR: 73 mL/min/{1.73_m2} (ref >=60)

## 2021-07-05 LAB — MAGNESIUM: Magnesium: 1.8 mg/dL (ref 1.5–2.5)

## 2021-07-05 LAB — LIPID PANEL
Cholesterol: 121 mg/dL (ref <200)
HDL: 22 mg/dL — ABNORMAL LOW (ref >=40)
LDL Cholesterol (Calc): 76 mg/dL (calc)
Non-HDL Cholesterol (Calc): 99 mg/dL (calc) (ref <130)
Total CHOL/HDL Ratio: 5.5 (calc) — ABNORMAL HIGH (ref <5.0)
Triglycerides: 144 mg/dL (ref <150)

## 2021-07-05 LAB — PSA: PSA: 0.05 ng/mL (ref <=4.00)

## 2021-07-05 LAB — URIC ACID: Uric Acid, Serum: 4.5 mg/dL (ref 4.0–8.0)

## 2021-07-05 LAB — HEMOGLOBIN A1C
Hgb A1c MFr Bld: 5.2 % of total Hgb (ref <5.7)
Mean Plasma Glucose: 103 mg/dL
eAG (mmol/L): 5.7 mmol/L

## 2021-07-05 LAB — MICROALBUMIN / CREATININE URINE RATIO
Creatinine, Urine: 31 mg/dL (ref 20–320)
Microalb Creat Ratio: 16 mcg/mg creat (ref <30)
Microalb, Ur: 0.5 mg/dL

## 2021-07-05 LAB — VITAMIN D 25 HYDROXY (VIT D DEFICIENCY, FRACTURES): Vit D, 25-Hydroxy: 85 ng/mL (ref 30–100)

## 2021-07-05 LAB — INSULIN, RANDOM: Insulin: 13.8 u[IU]/mL

## 2021-07-05 NOTE — Progress Notes (Signed)
=============================================================== °-   Test results slightly outside the reference range are not unusual. If there is anything important, I will review this with you,  otherwise it is considered normal test values.  If you have further questions,  please do not hesitate to contact me at the office or via My Chart.  =============================================================== ===============================================================  -  CBC suggests Low Vit B12 .   - Recommend take Vit B12 1,000 or 2,500 or 5,000 mcg Sub-lingual tablet Daily  =============================================================== ===============================================================  -  Thyroid test shows elevated hormone in Blood,   So need to decrease Thyroxine 200 mcg /daily to   1/2 tablet 3 x /week on Mon Wed Fri & 1 whole tab other 4 days /week   - Then need to schedule a NV in 1 month to recheck Thyroid level (TSH)  =============================================================== ===============================================================  -  Total Chol = 121 - Wonderful  -  Excellent - please continue meds same   - Very low risk for Heart Attack  / Stroke ============================================================ ============================================================  -  PSA - Very Low  - Great  =============================================================== ===============================================================  -  Uric Acid / Gout Test = Normal  / OK  - Please continue Allopurinol same  =============================================================== ===============================================================  -   Magnesium  -   1.8  -  very  low- goal is betw 2.0 - 2.5,   - So..............Marland Kitchen  Recommend that you take  Magnesium 500 mg tablet daily   - also important to eat lots of  leafy green vegetables   - spinach -  Kale - collards - greens - okra - asparagus  - broccoli - quinoa - squash - almonds   - black, red, white beans  -  peas - green beans =============================================================== ===============================================================  -  A1c = 5.2% - Normal  - No Diabetes  - Great  !  =============================================================== ===============================================================  -  Vitamin D = 85 - Excellent - Please continue dose same  =============================================================== ===============================================================  -  All Else- Kidneys - Electrolytes - Liver - all  Normal / OK =============================================================== ===============================================================

## 2021-07-09 DIAGNOSIS — I255 Ischemic cardiomyopathy: Secondary | ICD-10-CM | POA: Diagnosis not present

## 2021-07-09 DIAGNOSIS — I480 Paroxysmal atrial fibrillation: Secondary | ICD-10-CM | POA: Diagnosis not present

## 2021-07-09 DIAGNOSIS — Z95810 Presence of automatic (implantable) cardiac defibrillator: Secondary | ICD-10-CM | POA: Diagnosis not present

## 2021-07-09 DIAGNOSIS — Z7901 Long term (current) use of anticoagulants: Secondary | ICD-10-CM | POA: Diagnosis not present

## 2021-07-10 ENCOUNTER — Other Ambulatory Visit: Payer: Self-pay | Admitting: Internal Medicine

## 2021-07-11 ENCOUNTER — Other Ambulatory Visit: Payer: Self-pay | Admitting: Internal Medicine

## 2021-07-11 MED ORDER — AZELASTINE HCL 0.1 % NA SOLN
NASAL | 3 refills | Status: DC
Start: 2021-07-11 — End: 2021-08-01

## 2021-07-16 ENCOUNTER — Other Ambulatory Visit: Payer: Self-pay | Admitting: Hematology

## 2021-07-19 ENCOUNTER — Telehealth: Payer: Self-pay

## 2021-07-19 NOTE — Telephone Encounter (Signed)
07/19/21-Called patient to confirm visit w/ cpp. Unable to reach patient. [place]. ? ?Total time spent: 2 Minutes ?

## 2021-07-22 ENCOUNTER — Other Ambulatory Visit: Payer: Self-pay

## 2021-07-22 DIAGNOSIS — Z1211 Encounter for screening for malignant neoplasm of colon: Secondary | ICD-10-CM | POA: Diagnosis not present

## 2021-07-22 DIAGNOSIS — Z1212 Encounter for screening for malignant neoplasm of rectum: Secondary | ICD-10-CM | POA: Diagnosis not present

## 2021-07-22 LAB — POC HEMOCCULT BLD/STL (HOME/3-CARD/SCREEN)
Card #2 Fecal Occult Blod, POC: NEGATIVE
Card #3 Fecal Occult Blood, POC: NEGATIVE
Fecal Occult Blood, POC: NEGATIVE

## 2021-07-23 ENCOUNTER — Other Ambulatory Visit: Payer: Self-pay

## 2021-07-23 ENCOUNTER — Ambulatory Visit: Payer: Medicare Other | Admitting: Pharmacist

## 2021-07-23 VITALS — BP 119/77 | HR 73

## 2021-07-23 DIAGNOSIS — I10 Essential (primary) hypertension: Secondary | ICD-10-CM

## 2021-07-23 DIAGNOSIS — N401 Enlarged prostate with lower urinary tract symptoms: Secondary | ICD-10-CM

## 2021-07-23 DIAGNOSIS — Z79899 Other long term (current) drug therapy: Secondary | ICD-10-CM

## 2021-07-23 DIAGNOSIS — K5900 Constipation, unspecified: Secondary | ICD-10-CM

## 2021-07-23 DIAGNOSIS — E782 Mixed hyperlipidemia: Secondary | ICD-10-CM

## 2021-07-23 DIAGNOSIS — R7309 Other abnormal glucose: Secondary | ICD-10-CM

## 2021-07-23 DIAGNOSIS — E039 Hypothyroidism, unspecified: Secondary | ICD-10-CM

## 2021-07-26 NOTE — Progress Notes (Signed)
Pharmacist Visit  [name], [name] [id number] 7 years, Male  DOB: 20-Mar-1941  M: [phone] Care Team: Rhys Martini, Jeannett Senior __________________________________________________ Clinical Summary Patient Risk: Moderate Next CCM Follow Up: 11/05/21 '@11'$ :45 am Next AWV: 03/11/2022 Next PCP Visit: 08/26/2021 Summary for PCP: [name] [name] is a friendly 81 year old male who presents today for CCM follow-up visit. Pt medication changes include D'c/d Lasix and replaced it with torsemide. Pt started Tamsulosin. Pt endorses some slight stomach cramping in the morning which may be due to some constipation (pt notes having smaller stools than before). Pt has not been eating as much and dropped from around 250 pounds to 230. Counseled the patient on importance of increasing fiber intake for stomach cramping and constipation. Extensively discussed levothyroxine new dose (pt had not been able to start taking 1/2 a pill MWF). Pt voiced understanding. Will check in with patient in 2 weeks and ensure he was able to update pill box and take thyroxine pill correctly. Attestation Statement:: CCM Services:  This encounter meets complex CCM services and moderate to high medical decision making.  Prior to outreach and patient consent for Chronic Care Management, I referred this patient for services after reviewing the nominated patient list or from a personal encounter with the patient.  I have personally reviewed this encounter including the documentation in this note and have collaborated with the care management provider regarding care management and care coordination activities to include development and update of the comprehensive care plan I am certifying that I agree with the content of this note and encounter as supervising physician.   Chronic Conditions Patient's Chronic Conditions: Hypertension (HTN), Atrial Fibrillation, Gastroesophageal Reflux Disease (GERD), Hypothyroidism, Benign Prostatic  Hyperplasia (BPH), Hyperlipidemia/Dyslipidemia (HLD), Diabetes (DM), Constipation, Vitamin D deficiency, Prediabetes, Memory changes, Thrombophilia  Disease Assessments Current BP: 112/78 Current HR: 71 taken on: 07/04/2020 Previous BP: 100/70 Previous HR: 70 taken on: 06/05/2021 Weight: 238 BMI: 37.40 Last GFR: 73 taken on: 07/04/2021 Visit Completed on: 07/23/2021 Why did the patient present?: CCM follow-up visit Marital status?: Married Details: [place] Name and location of Current pharmacy: [place], [place] Any additional demeanor/mood notes?: [name] [name] is a friendly 81 year old male who presents today for CCM follow-up visit. Pt medication changes include D'c/d Lasix and replaced it with torsemide. Pt started Tamsulosin. Pt endorses some slight stomach cramping in the morning which may be due to some constipation  (pt notes having smaller stools than before). Pt has not been eating as much and dropped from around 250 pounds to 230.  Hypertension (HTN) Assess this condition today?: Yes Is patient able to obtain BP reading today?: Yes BP today is: 119/77 Heart Rate is: 73 Goal: <130/80 mmHG Hypertension Stage: Normal (SBP <120 and DBP < 80) Is Patient checking BP at home?: No How often does patient miss taking their blood pressure medications?: Never Has patient experienced hypotension, dizziness, falls or bradycardia?: No Check present secondary causes (below) for HTN: Obesity, hyperthyroidism We discussed: DASH diet:  following a diet emphasizing fruits and vegetables and low-fat dairy products along with whole grains, fish, poultry, and nuts. Reducing red meats and sugars., Targeting 150 minutes of aerobic activity per week Assessment:: Controlled Drug: Losartan '100mg'$  QD Assessment: Appropriate, Effective, Safe, Accessible Drug: metoprolol (TOPROL-XL) 200 MG 24 hr tablet Take 200 mg by mouth daily Assessment:  Appropriate, Effective, Safe, Accessible Drug: spironolactone (ALDACTONE) 25 MG tablet TAKE 1 TABLET EVERY  DAY FOR BLOOD PRESSURE AND HEART FAILURE Assessment: Appropriate, Effective, Safe, Accessible Additional Info: Pt states he is unable to exercise at this time due to injuring his left arm while cutting off tree branches. Encouraged patient to use treadmill and do other cardio exercises rather than lifting weights. Pharmacist Follow up: Assess Home and in Office BP readings. Follow-up on pt exercise  Hyperlipidemia/Dyslipidemia (HLD) Last Lipid panel on: 07/04/2021 TC (Goal<200): 121 LDL: 76 HDL (Goal>40): 22 TG (Goal<150): 144 ASCVD 10-year risk?is:: N/A due to Age > 79 Assess this condition today?: Yes LDL Goal: <70 Check present secondary causes (below) that can lead to increased cholesterol levels (multi-choice optional): Beta blockers We discussed: Encouraged increasing fiber to a daily intake of 10-25g/day, How to reduce cholesterol through diet/weight management and physical activity., How a diet high in plant sterols (fruits/vegetables/nuts/whole grains/legumes) may reduce your cholesterol. Assessment:: Controlled Drug: rosuvastatin (CRESTOR) 40 MG tablet TAKE 1 TABLET EVERY DAY FOR CHOLESTEROL Assessment: Appropriate, Effective, Safe, Accessible Pharmacist Follow up: Assess lipid panel, LFTs, s/s rhabdo  PreDiabetes (DM) Current A1C: 5.2 taken on: 07/04/2021 Previous A1C: 5.3 taken on: 11/28/2020 Type: Pre-Diabetes/Impaired Fasting Glucose Assess this condition today?: Yes Goal A1C: < 6.5 % Type: Pre-Diabetes/Impaired Fasting Glucose We discussed: Modifying lifestyle, including to participate in moderate physical activity (e.g., walking) at least 150 minutes per week. Assessment:: Controlled Drug: None Pharmacist Follow up: Assess A1c and FBG  BPH Current PSA: 0.05 taken on: 07/04/2021 Assess this condition today?: Yes Are you experiencing any side effects from  your BPH medication?: None What changes have you made to your diet / lifestyle to help manage your BPH symptoms?: Taking extra time to ensure complete voiding of the bladder (i.e. waiting a few moments then trying to void again), Reducing liquid consumption for 2 hours before bedtime Completing BPH AUA Questionnaire today?: No How would you feel if you had to live with your urinary condition the way it is now, no better, no worse, for the rest of your life?: Mostly Satisfied We discussed: Double voiding, Limit fluid intake for 2-3 hours before bedtime Assessment:: Controlled Drug: tamsulosin (FLOMAX) 0.4 MG CAPS capsule Take 1 tablet at Bedtime for Prostate & Bladder Assessment: Appropriate, Effective, Safe, Accessible Drug: finasteride (PROSCAR) 5 MG tablet TAKE 1 TABLET EVERY DAY FOR PROSTATE Assessment: Appropriate, Effective, Safe, Accessible Pharmacist Follow up: PSA  Hypothyroidism Current TSH: 0.25 taken on: 07/04/2021 Previous TSH: 1.14 taken on: 03/07/2021 Current T4: Unknown Current T3: Unknown Assess this condition today?: Yes We discussed: Monitoring signs / symptoms of hyperthyroidism (weight loss, tachycardia, anxiety, increased sweating), Proper administration of levothyroxine (empty stomach, 30 min before breakfast and with no other meds / vitamins) Assessment:: Uncontrolled Drug: levothyroxine (SYNTHROID) 200 MCG tablet TAKE 1/2 tablet MWF and 1 TAB on all other days DAILY ON EMPTY STOMACH WITH ONLY WATER FOR 30 MINS. NO ANTACIDS, CALCIUM OR MAGNESIUM FOR 4 HOURS. AVOID BIOTIN Assessment: Appropriate, Effective, Query Safety Pharmacist Follow up: TSH recheck 08/26/21  Exercise, Diet and Non-Drug Coordination Needs Additional exercise counseling points. We discussed: targeting at least 150 minutes per week of moderate-intensity aerobic exercise., decreasing sedentary behavior Additional diet counseling points. We discussed: aiming to consume at least 8 cups of water day,  key components of the DASH diet, Other Details: Increase fiber intake Discussed Non-Drug Care Coordination Needs: Yes Does Patient have Medication financial barriers?: No  [facility] Health-Related Social Needs Screening Tool -  SDOH  ([email or web address]) Engagement Notes Newton Pigg on 07/23/2021 04:35 PM CPP  Chart Review: 11 min CPP Office Visit: 36 min CPP Office Visit Documentation: 30 min CPP Coordination of Care: [facility] Care Plan Completion: 12 Minutes CPP Care Plan Review: 10 min  HC follow up:  Please call patient in 2 weeks and verify that the patient has been able to change levothyroxine dose. Pt should now be taking 1/2 tablet on MWF and 1 tablet on all other days. Thanks!  Care Gaps: Shingrix: Pt educated COVID Booster: Pt educated        [employer]. Jeannett Senior, PharmD  Clinical Pharmacist  Lyra Alaimo.Lilyannah [email or web address] .care  [phone]

## 2021-07-30 DIAGNOSIS — Z20822 Contact with and (suspected) exposure to covid-19: Secondary | ICD-10-CM | POA: Diagnosis not present

## 2021-08-01 ENCOUNTER — Ambulatory Visit (INDEPENDENT_AMBULATORY_CARE_PROVIDER_SITE_OTHER): Payer: Medicare Other | Admitting: Gastroenterology

## 2021-08-01 ENCOUNTER — Ambulatory Visit: Payer: Medicare Other | Admitting: Internal Medicine

## 2021-08-01 ENCOUNTER — Encounter: Payer: Self-pay | Admitting: Gastroenterology

## 2021-08-01 VITALS — BP 132/70 | HR 79 | Wt 230.0 lb

## 2021-08-01 DIAGNOSIS — R1310 Dysphagia, unspecified: Secondary | ICD-10-CM

## 2021-08-01 DIAGNOSIS — I2510 Atherosclerotic heart disease of native coronary artery without angina pectoris: Secondary | ICD-10-CM | POA: Diagnosis not present

## 2021-08-01 NOTE — Progress Notes (Signed)
? ? ? ?08/01/2021 ?[name] [name] ?[id number] ?1941-04-24 ? ? ?HISTORY OF PRESENT ILLNESS: This is an 81 year old male who is a patient of Dr. Lynne Leader, known to him from colonoscopy back in 2008.  He was seen by me back in 2021 for complaints of rectal bleeding was found to have a fissure.  He is here today for complaints of dysphagia.  He tells me that he had an EGD probably about 10 years or so ago in [place] and had esophageal dilation at that time.  He said he has been good up until the last couple of months.  He has been having issues with pills getting stuck in his throat or upper esophagus.  He says that one time one of his pills got stuck and then actually regurgitated back up again quite some time later.  Does not happen all the time but more frequent now.  Occasional issues with food getting stuck, but nothing severe or dramatic.  He has multiple medical problems including cardiac issues with a defibrillator in place and is on Coumadin. ? ? ?Past Medical History:  ?Diagnosis Date  ? A-fib ([place]) 01/2014  ? AICD (automatic cardioverter/defibrillator) present   ? [place]  ? Anal fissure 11/10/2019  ? Arthritis   ? ASHD (arteriosclerotic heart disease) 2015  ? s/p CABG  ? Asthma   ? CAD (coronary artery disease)   ? Cataract   ? s/p left  ? CHF (congestive heart failure) ([place])   ? COPD (chronic obstructive pulmonary disease) ([place])   ? by CXR, pt unsure of this  ? Diverticulosis   ? GERD (gastroesophageal reflux disease)   ? Gout 2014  ? Hyperlipidemia   ? Hypertension   ? Hypothyroidism   ? LBBB (left bundle branch block)   ? Polycythemia   ? Pre-diabetes   ? Prediabetes 01/2014  ? Presence of permanent cardiac pacemaker   ? Sleep apnea   ? no CPAP  ? Thyroid disease   ? hypothyroid  ? ?Past Surgical History:  ?Procedure Laterality Date  ? CARDIAC DEFIBRILLATOR PLACEMENT  07/2014  ? CATARACT EXTRACTION W/ INTRAOCULAR LENS IMPLANT Left   ? CORONARY ARTERY BYPASS GRAFT  11/2013  ? lumb laminectomy   [phone]  ? x 3  ? LUMBAR FUSION  2013  ? NASAL SEPTOPLASTY W/ TURBINOPLASTY Bilateral 01/30/2016  ? Procedure: NASAL SEPTOPLASTY WITH BILATERAL TURBINATE REDUCTION;  Surgeon: Rozetta Nunnery, MD;  Location: [facility];  Service: ENT;  Laterality: Bilateral;  ? TONSILLECTOMY    ? UVULECTOMY N/A 01/30/2016  ? Procedure: UVULECTOMY;  Surgeon: Rozetta Nunnery, MD;  Location: [facility];  Service: ENT;  Laterality: N/A;  ? ? reports that he quit smoking about 38 years ago. His smoking use included cigarettes. He has a 50.00 pack-year smoking history. He has never used smokeless tobacco. He reports that he does not currently use alcohol. He reports that he does not use drugs. ?family history includes Alcohol abuse in his son; Alzheimer's disease in his mother; Dementia in his maternal grandmother; Depression in his mother; Heart attack in his son; Heart disease (age of onset: 53) in his father; Hypertension in his father; Mental illness in his mother; Stroke in his maternal grandmother. ?Allergies  ?Allergen Reactions  ? Other Swelling  ?  SODIUM SENSITIVE  ? ? ?  ?Outpatient Encounter Medications as of 08/01/2021  ?Medication Sig  ? acetaminophen (TYLENOL) 500 MG tablet Take 500 mg by mouth as needed for mild pain.  ?  allopurinol (ZYLOPRIM) 300 MG tablet TAKE 1 TABLET EVERY DAY TO PREVENT GOUT  ? aspirin EC 81 MG tablet Take 81 mg by mouth daily.  ? Cholecalciferol (VITAMIN D PO) Take 10,000 Units by mouth daily.  ? docusate sodium (COLACE) 100 MG capsule Take 100 mg by mouth 2 (two) times daily. 1 tablet in the morning and 1 tablet at bedtime (stool softener)  ? finasteride (PROSCAR) 5 MG tablet TAKE 1 TABLET EVERY DAY FOR PROSTATE  ? hydroxyurea (HYDREA) 500 MG capsule TAKE 2 CAPSULES ONE TIME DAILY WITH FOOD  ? isosorbide mononitrate (IMDUR) 30 MG 24 hr tablet Take by mouth daily.  ? levothyroxine (SYNTHROID) 200 MCG tablet TAKE 1 TAB DAILY ON EMPTY STOMACH WITH ONLY WATER FOR 30 MINS. NO ANTACIDS, CALCIUM OR  MAGNESIUM FOR 4 HOURS. AVOID BIOTIN. (Patient taking differently: TAKE 1/2 TAB on Mon, Wed, Friday and take 1 whole tablet on all other days ON EMPTY STOMACH WITH ONLY WATER FOR 30 MINS. NO ANTACIDS, CALCIUM OR MAGNESIUM FOR 4 HOURS. AVOID BIOTIN.)  ? losartan (COZAAR) 100 MG tablet Take 1 tablet (100 mg total) by mouth daily.  ? magnesium oxide (MAG-OX) 400 MG tablet Take by mouth.  ? MELATONIN PO Take 1 tablet by mouth as needed.  ? metoprolol (TOPROL-XL) 200 MG 24 hr tablet Take 200 mg by mouth daily.  ? Multiple Vitamin (MULTIVITAMIN) tablet Take 1 tablet by mouth daily. A-Z  ? OVER THE COUNTER MEDICATION OTC Biotin daily.  ? polyethylene glycol powder (GLYCOLAX/MIRALAX) 17 GM/SCOOP powder Take 17 g by mouth daily.  ? Probiotic Product (PROBIOTIC-10 PO) Take by mouth daily.  ? rosuvastatin (CRESTOR) 40 MG tablet TAKE 1 TABLET EVERY DAY FOR CHOLESTEROL  ? spironolactone (ALDACTONE) 25 MG tablet TAKE 1 TABLET EVERY DAY FOR BLOOD PRESSURE AND HEART FAILURE  ? tamsulosin (FLOMAX) 0.4 MG CAPS capsule Take 1 tablet at Bedtime for Prostate & Bladder  ? torsemide (DEMADEX) 20 MG tablet Take 1 tablet every Morning for Swelling in legs  ? TURMERIC PO Take 1,000 mg by mouth daily.  ? warfarin (COUMADIN) 5 MG tablet Take 1 to 2 tablets Daily as Directed (Patient taking differently: Take 1 to 2 tablets Daily as Directed (Taking 1.5 on Mondays))  ? zinc gluconate 50 MG tablet Take 50 mg by mouth daily.  ? [DISCONTINUED] albuterol (PROVENTIL) (2.5 MG/3ML) 0.083% nebulizer solution Use   1 vial   4 x /day   or every 4 hours for Wheezing or Shortness of Breath (Patient not taking: Reported on 08/01/2021)  ? [DISCONTINUED] azelastine (ASTELIN) 0.1 % nasal spray 2 sprays into Both Nostrils  2 to 3  x /day (Patient not taking: Reported on 08/01/2021)  ? [DISCONTINUED] diphenhydrAMINE (BENADRYL) 25 MG tablet Take 25 mg by mouth at bedtime. (Patient not taking: Reported on 08/01/2021)  ? ?No facility-administered encounter medications  on file as of 08/01/2021.  ? ? ? ?REVIEW OF SYSTEMS  : All other systems reviewed and negative except where noted in the History of Present Illness. ? ? ?PHYSICAL EXAM: ?BP 132/70   Pulse 79   Wt 230 lb (104.3 kg)   SpO2 98%   BMI 36.02 kg/m?  ?General: Well developed white male in no acute distress ?Head: Normocephalic and atraumatic ?Eyes:  Sclerae anicteric, conjunctiva pink. ?Ears: Normal auditory acuity ?Lungs: Clear throughout to auscultation; no W/R/R. ?Heart: Regular rate and rhythm; no M/R/G. ?Abdomen: Soft, non-distended.  BS present.  Non-tender. ?Musculoskeletal: Symmetrical with no gross deformities  ?Skin: No  lesions on visible extremities ?Extremities: No edema  ?Neurological: Alert oriented x 4, grossly non-focal ?Psychological:  Alert and cooperative. Normal mood and affect ? ?ASSESSMENT AND PLAN: ?*Dysphagia: Mostly to pills, seems like they get caught in his throat.  Occasionally dysphagia to solid foods.  Reports having dilation several years ago in [place].  We obviously do not have those records.  He is now almost 81 years old, has a defibrillator and is on Coumadin along with other medical problems.  I am going to start with an esophagram with tablet to see if he does in fact have any stricture/structural issues that could be addressed by endoscopy or if this is rather a motility problem.  Pending results may also benefit from a modified barium swallow study. ? ? ?CC:  [name], MD ? ?  ?

## 2021-08-01 NOTE — Patient Instructions (Signed)
If you are age 81 or older, your body mass index should be between 23-30. Your Body mass index is 36.02 kg/m?Marland Kitchen If this is out of the aforementioned range listed, please consider follow up with your Primary Care Provider. ? ?If you are age 66 or younger, your body mass index should be between 19-25. Your Body mass index is 36.02 kg/m?Marland Kitchen If this is out of the aformentioned range listed, please consider follow up with your Primary Care Provider.  ? ?________________________________________________________ ? ?The [facility] providers would like to encourage you to use [facility] to communicate with providers for non-urgent requests or questions.  Due to long hold times on the telephone, sending your provider a message by [facility] may be a faster and more efficient way to get a response.  Please allow 48 business hours for a response.  Please remember that this is for non-urgent requests.  ?_______________________________________________________ ? ?You have been scheduled for a Barium Esophogram at [facility] (1st floor of the hospital) on 3/29/20233 at 11:00am. Please arrive 15 minutes prior to your appointment for registration. Make certain not to have anything to eat or drink 3 hours prior to your test. If you need to reschedule for any reason, please contact radiology at [phone] to do so. ?__________________________________________________________________ ?A barium swallow is an examination that concentrates on views of the esophagus. This tends to be a double contrast exam (barium and two liquids which, when combined, create a gas to distend the wall of the oesophagus) or single contrast (non-ionic iodine based). The study is usually tailored to your symptoms so a good history is essential. Attention is paid during the study to the form, structure and configuration of the esophagus, looking for functional disorders (such as aspiration, dysphagia, achalasia, motility and reflux) ?EXAMINATION ?You may be  asked to change into a gown, depending on the type of swallow being performed. A radiologist and radiographer will perform the procedure. The radiologist will advise you of the ?type of contrast selected for your procedure and direct you during the exam. You will be asked to stand, sit or lie in several different positions and to hold a small amount of fluid in your mouth before being asked to swallow while the imaging is performed .In some instances you may be asked to swallow barium coated marshmallows to assess the motility of a solid food bolus. ?The exam can be recorded as a digital or video fluoroscopy procedure. ?POST PROCEDURE ?It will take 1-2 days for the barium to pass through your system. To facilitate this, it is important, unless otherwise directed, to increase your fluids for the next 24-48hrs and to resume your normal diet.  ?This test typically takes about 30 minutes to perform. ?__________________________________________________________________________________ ? ?

## 2021-08-02 ENCOUNTER — Telehealth: Payer: Self-pay | Admitting: Gastroenterology

## 2021-08-02 NOTE — Telephone Encounter (Signed)
Spoke with patient and informed him that for now we will continue with esophagram. Patient voiced understanding.  ?

## 2021-08-02 NOTE — Telephone Encounter (Signed)
Received a call from patient who saw Jessica yesterday.  He forgot to tell her over the last two months he's had about 6 episodes of where he goes to swallow something, it gets stuck, and it cuts off his breathing.  He wanted to make sure you knew this in case it made a difference in the testing he's being scheduled for. ? ?Thank you. ?

## 2021-08-06 NOTE — Telephone Encounter (Signed)
LM-08/06/21-Called patient at [phone] to confirm he was able to change his levothyroxine dose. Unable to reach patient. Left detailed message for patient to return call. ? ?Total time spent: 2 Minutes ?Vanetta Shawl, [facility] ?

## 2021-08-08 NOTE — Telephone Encounter (Signed)
LM-08/08/21-Pt. confirmed he was able to change his levothyroxine dose successfully. Pt. denies any health concerns/side effects. Also confirmed upcoming visit w/ Dr. Melford Aase and f/u w/ CPP. ? ?Total time spent: 5 min ?

## 2021-08-13 ENCOUNTER — Ambulatory Visit (INDEPENDENT_AMBULATORY_CARE_PROVIDER_SITE_OTHER): Payer: Medicare Other | Admitting: Nurse Practitioner

## 2021-08-13 ENCOUNTER — Encounter: Payer: Self-pay | Admitting: Nurse Practitioner

## 2021-08-13 ENCOUNTER — Other Ambulatory Visit: Payer: Self-pay

## 2021-08-13 VITALS — BP 120/76 | HR 74 | Temp 97.9°F | Resp 17 | Ht 67.0 in | Wt 229.2 lb

## 2021-08-13 DIAGNOSIS — M109 Gout, unspecified: Secondary | ICD-10-CM

## 2021-08-13 DIAGNOSIS — S40022A Contusion of left upper arm, initial encounter: Secondary | ICD-10-CM

## 2021-08-13 DIAGNOSIS — B351 Tinea unguium: Secondary | ICD-10-CM

## 2021-08-13 DIAGNOSIS — B353 Tinea pedis: Secondary | ICD-10-CM | POA: Diagnosis not present

## 2021-08-13 DIAGNOSIS — E039 Hypothyroidism, unspecified: Secondary | ICD-10-CM | POA: Diagnosis not present

## 2021-08-13 MED ORDER — TERBINAFINE HCL 250 MG PO TABS: ORAL_TABLET | ORAL | 0 refills | Status: DC

## 2021-08-13 NOTE — Progress Notes (Signed)
Assessment and Plan: ? ?Mr. Stevens was seen today for a follow up. ? ?Diagnoses and all orders for this visit: ? ? ?1. Acute gout involving toe of left foot, unspecified cause ?Continue allopurinol. ?Discussed starting colchicine if uric acid levels are elevated. Results pending. ?Refrain from high purine foods such as alcohol, red meat, chocolate.  ? ?- Uric acid ? ? ?2. Tinea pedis of both feet/Onychomycosis ?Start Terbinafine. ?RTC in 6 weeks for CMP/LFT review.  Last check 06/2021 WNL. ?Keep feet dry and free from moisture.  Change out shoes. ?   ?- terbinafine (LAMISIL) 250 MG tablet; Take 1 tablet Daily for Toenail Fungus  Dispense: 90 tablet; Refill: 0 ? ?3. Hypothyroidism, unspecified type ? ?Continue current dose of Levothyroxine until TSH levels result. ?Discussed how taking Biotin with thyroid medicine can alter cause falsely low TSH levels.  ?Take Biotin 1-2 hours after taking thyroid medication. ? ?- TSH ? ?4. Traumatic ecchymosis of left upper arm, initial encounter ? ?Refer to Orthopedics for review and evaluation of tendon/muscle tear. ?Rest the area.  Refrain from straining the area, no heavy lifting. ?Patient requests to be referred to [facility] Dr. Berniece Andreas.   ? ?- Ambulatory referral to Orthopedics ? ? ?RTC if s/s fail to improve.  ? ?Further disposition pending results of labs. Discussed med's effects and SE's.   ?Over 30 minutes of exam, counseling, chart review, and critical decision making was performed.  ? ?Future Appointments  ?Date Time Provider [place]  ?08/14/2021 11:00 AM [facility] R/F 1 [facility] [place]  ?08/19/2021  1:30 PM Dohmeier, Asencion Partridge, MD [facility] None  ?08/26/2021 11:30 AM [name], MD [facility] None  ?09/04/2021  2:00 PM [facility] None  ?09/04/2021  2:20 PM Brunetta Genera, MD [facility] None  ?10/16/2021 11:30 AM [name], NP [facility] None  ?11/05/2021 11:45 AM Newton Pigg, RPH [facility] None  ?03/11/2022 10:00 AM  [name], NP [facility] None  ?07/10/2022  2:00 PM [name], MD [facility] None  ? ? ?------------------------------------------------------------------------------------------------------------------ ? ? ?HPI ?BP 120/76   Pulse 74   Temp 97.9 ?F (36.6 ?C)   Resp 17   Ht '5\' 7"'$  (1.702 m)   Wt 229 lb 3.2 oz (104 kg)   SpO2 96%   BMI 35.90 kg/m?  ? ?81 y.o.male present to clinic accompanied by wife with reports of bilateral toenail fungus.  Wife reports that over the last several months she has noticed an increase in hardening of the first toenail with white colored toenails on the adjacent toes, as well as increase in dryness.  She hast not applied any creams or lotions.  Patient tries to keep his feet dry, but wears the same rubber flip flops daily.  Associated worsening itching. ? ?He is also concerned a LUE bruising area that he has noticed increase in length and size over the last two days.  States a few weeks ago he was trimming limbs with a hedge cutter and heard a pop in the LUE area along his bicep area.  He rested the arm and applied Voltaren gel to the the area with improvement.  Then, 3 days ago he was loading heavy bricks onto a trailer and felt the area "pop" again.  He reported LROM at first, but is now able to move his arm with FROM.  The bruising has spread from down his left bicep to his left wrist.  He is currently on warfarin.  Reports having INR levels checked approximately 4 weeks ago  and they were WNL at 2.3.  He will f/u with them in two weeks.  Reports medication compliance.  ? ?Patient is also concerned for a gout flare that started 1 week ago.  Reports starting in the left great toe.  Endorses swelling and pain but has had some improvement.  He takes Allopurinol daily.   Has not skipped  a dose.  He has not increased intake of purines. Last checked 06/2021 and WNL at 4.5. ? ? ?During 06/2021 OV patient Levothyroxine dosage was changed d/t low TSH levels (0.28).  He was  aksed to RTC in 1 mo for a lab draw, however he has not returned tuntil today.  He reports continuing to take Biotin daily.   ? ?Past Medical History:  ?Diagnosis Date  ? A-fib ([place]) 01/2014  ? AICD (automatic cardioverter/defibrillator) present   ? [place]  ? Anal fissure 11/10/2019  ? Arthritis   ? ASHD (arteriosclerotic heart disease) 2015  ? s/p CABG  ? Asthma   ? CAD (coronary artery disease)   ? Cataract   ? s/p left  ? CHF (congestive heart failure) ([place])   ? COPD (chronic obstructive pulmonary disease) ([place])   ? by CXR, pt unsure of this  ? Diverticulosis   ? GERD (gastroesophageal reflux disease)   ? Gout 2014  ? Hyperlipidemia   ? Hypertension   ? Hypothyroidism   ? LBBB (left bundle branch block)   ? Polycythemia   ? Pre-diabetes   ? Prediabetes 01/2014  ? Presence of permanent cardiac pacemaker   ? Sleep apnea   ? no CPAP  ? Thyroid disease   ? hypothyroid  ?  ? ?Allergies  ?Allergen Reactions  ? Other Swelling  ?  SODIUM SENSITIVE  ? ? ?Current Outpatient Medications on File Prior to Visit  ?Medication Sig  ? acetaminophen (TYLENOL) 500 MG tablet Take 500 mg by mouth as needed for mild pain.  ? allopurinol (ZYLOPRIM) 300 MG tablet TAKE 1 TABLET EVERY DAY TO PREVENT GOUT  ? aspirin EC 81 MG tablet Take 81 mg by mouth daily.  ? Cholecalciferol (VITAMIN D PO) Take 10,000 Units by mouth daily.  ? docusate sodium (COLACE) 100 MG capsule Take 100 mg by mouth 2 (two) times daily. 1 tablet in the morning and 1 tablet at bedtime (stool softener)  ? finasteride (PROSCAR) 5 MG tablet TAKE 1 TABLET EVERY DAY FOR PROSTATE  ? hydroxyurea (HYDREA) 500 MG capsule TAKE 2 CAPSULES ONE TIME DAILY WITH FOOD  ? isosorbide mononitrate (IMDUR) 30 MG 24 hr tablet Take by mouth daily.  ? levothyroxine (SYNTHROID) 200 MCG tablet TAKE 1 TAB DAILY ON EMPTY STOMACH WITH ONLY WATER FOR 30 MINS. NO ANTACIDS, CALCIUM OR MAGNESIUM FOR 4 HOURS. AVOID BIOTIN. (Patient taking differently: TAKE 1/2 TAB on Mon, Wed, Friday and take 1  whole tablet on all other days ON EMPTY STOMACH WITH ONLY WATER FOR 30 MINS. NO ANTACIDS, CALCIUM OR MAGNESIUM FOR 4 HOURS. AVOID BIOTIN.)  ? losartan (COZAAR) 100 MG tablet Take 1 tablet (100 mg total) by mouth daily.  ? magnesium oxide (MAG-OX) 400 MG tablet Take by mouth.  ? MELATONIN PO Take 1 tablet by mouth as needed.  ? metoprolol (TOPROL-XL) 200 MG 24 hr tablet Take 200 mg by mouth daily.  ? Multiple Vitamin (MULTIVITAMIN) tablet Take 1 tablet by mouth daily. A-Z  ? OVER THE COUNTER MEDICATION OTC Biotin daily.  ? polyethylene glycol powder (GLYCOLAX/MIRALAX) 17 GM/SCOOP powder Take 17  g by mouth daily.  ? Probiotic Product (PROBIOTIC-10 PO) Take by mouth daily.  ? rosuvastatin (CRESTOR) 40 MG tablet TAKE 1 TABLET EVERY DAY FOR CHOLESTEROL  ? spironolactone (ALDACTONE) 25 MG tablet TAKE 1 TABLET EVERY DAY FOR BLOOD PRESSURE AND HEART FAILURE  ? tamsulosin (FLOMAX) 0.4 MG CAPS capsule Take 1 tablet at Bedtime for Prostate & Bladder  ? torsemide (DEMADEX) 20 MG tablet Take 1 tablet every Morning for Swelling in legs  ? TURMERIC PO Take 1,000 mg by mouth daily.  ? warfarin (COUMADIN) 5 MG tablet Take 1 to 2 tablets Daily as Directed (Patient taking differently: Take 1 to 2 tablets Daily as Directed (Taking 1.5 on Mondays))  ? zinc gluconate 50 MG tablet Take 50 mg by mouth daily.  ? ?No current facility-administered medications on file prior to visit.  ? ? ?ROS: all negative except what is noted in the HPI.  ? ?Physical Exam: ? ?BP 120/76   Pulse 74   Temp 97.9 ?F (36.6 ?C)   Resp 17   Ht '5\' 7"'$  (1.702 m)   Wt 229 lb 3.2 oz (104 kg)   SpO2 96%   BMI 35.90 kg/m?  ? ?General Appearance: NAD.  Awake, conversant and cooperative. ?Eyes: PERRLA, EOMs intact.  Sclera white.  Conjunctiva without erythema. ?Sinuses: No frontal/maxillary tenderness.  No nasal discharge. Nares patent.  ?ENT/Mouth: Ext aud canals clear.  Bilateral TMs w/DOL and without erythema or bulging. Hearing intact.  Posterior pharynx without  swelling or exudate.  Tonsils without swelling or erythema.  ?Neck: Supple.  No masses, nodules or thyromegaly. ?Respiratory: Effort is regular with non-labored breathing. Breath sounds are equal bilaterally without rales,

## 2021-08-14 ENCOUNTER — Ambulatory Visit (HOSPITAL_COMMUNITY)
Admission: RE | Admit: 2021-08-14 | Discharge: 2021-08-14 | Disposition: A | Discharge status: Home / Self Care | Payer: Medicare Other | Source: Ambulatory Visit | Attending: Gastroenterology | Admitting: Gastroenterology

## 2021-08-14 DIAGNOSIS — R1310 Dysphagia, unspecified: Secondary | ICD-10-CM | POA: Diagnosis not present

## 2021-08-14 DIAGNOSIS — K225 Diverticulum of esophagus, acquired: Secondary | ICD-10-CM | POA: Diagnosis not present

## 2021-08-14 DIAGNOSIS — K224 Dyskinesia of esophagus: Secondary | ICD-10-CM | POA: Diagnosis not present

## 2021-08-14 LAB — URIC ACID: Uric Acid, Serum: 4.5 mg/dL (ref 4.0–8.0)

## 2021-08-14 LAB — TSH: TSH: 1.58 mIU/L (ref 0.40–4.50)

## 2021-08-14 NOTE — Progress Notes (Signed)
<><><><><><><><><><><><><><><><><><><><><> ?<><><><><><><><><><><><><><><><><><><><><> ?? ?- ?  Thyroid is normal - please continue dose Same  ?? ?<><><><><><><><><><><><><><><><><><><><><> ?<><><><><><><><><><><><><><><><><><><><><>

## 2021-08-14 NOTE — Progress Notes (Signed)
<><><><><><><><><><><><><><><><><><><><><> ?<><><><><><><><><><><><><><><><><><><><><> ? ?-    Thyroid is normal - please continue dose Same  ? ?<><><><><><><><><><><><><><><><><><><><><> ?<><><><><><><><><><><><><><><><><><><><><> ? ? ?

## 2021-08-15 ENCOUNTER — Telehealth: Payer: Self-pay | Admitting: Adult Health

## 2021-08-15 NOTE — Telephone Encounter (Signed)
Pt wants to talk to [facility] about those side effects from the medications they talked about.He is specifically concerned about white nails being one of them based on something his wife said to him  ?

## 2021-08-16 ENCOUNTER — Telehealth: Payer: Self-pay | Admitting: Gastroenterology

## 2021-08-16 ENCOUNTER — Other Ambulatory Visit: Payer: Self-pay

## 2021-08-16 DIAGNOSIS — K225 Diverticulum of esophagus, acquired: Secondary | ICD-10-CM

## 2021-08-16 DIAGNOSIS — R1310 Dysphagia, unspecified: Secondary | ICD-10-CM

## 2021-08-16 DIAGNOSIS — I10 Essential (primary) hypertension: Secondary | ICD-10-CM | POA: Diagnosis not present

## 2021-08-16 DIAGNOSIS — E039 Hypothyroidism, unspecified: Secondary | ICD-10-CM | POA: Diagnosis not present

## 2021-08-16 NOTE — Telephone Encounter (Signed)
See alternate result note.  ?

## 2021-08-16 NOTE — Telephone Encounter (Signed)
Patient returned your call regarding his results.  Thank you. ?

## 2021-08-19 ENCOUNTER — Ambulatory Visit (INDEPENDENT_AMBULATORY_CARE_PROVIDER_SITE_OTHER): Payer: Medicare Other | Admitting: Neurology

## 2021-08-19 ENCOUNTER — Encounter: Payer: Self-pay | Admitting: Neurology

## 2021-08-19 VITALS — BP 97/62 | HR 70 | Ht 67.0 in | Wt 232.5 lb

## 2021-08-19 DIAGNOSIS — G4733 Obstructive sleep apnea (adult) (pediatric): Secondary | ICD-10-CM

## 2021-08-19 DIAGNOSIS — Z789 Other specified health status: Secondary | ICD-10-CM

## 2021-08-19 DIAGNOSIS — D471 Chronic myeloproliferative disease: Secondary | ICD-10-CM

## 2021-08-19 DIAGNOSIS — I480 Paroxysmal atrial fibrillation: Secondary | ICD-10-CM | POA: Diagnosis not present

## 2021-08-19 DIAGNOSIS — I2510 Atherosclerotic heart disease of native coronary artery without angina pectoris: Secondary | ICD-10-CM

## 2021-08-19 DIAGNOSIS — J449 Chronic obstructive pulmonary disease, unspecified: Secondary | ICD-10-CM | POA: Diagnosis not present

## 2021-08-19 DIAGNOSIS — Z950 Presence of cardiac pacemaker: Secondary | ICD-10-CM | POA: Diagnosis not present

## 2021-08-19 DIAGNOSIS — G4730 Sleep apnea, unspecified: Secondary | ICD-10-CM | POA: Diagnosis not present

## 2021-08-19 NOTE — Patient Instructions (Signed)
Rv in 3-4 months. ? ?GOOD LUCK !!! ? ? PLEASE TRY TO USE YOUR NEW BIPAP EVERY NIGHT<  you can sleep in a recliner, and use the chin strap and nasal pillow.  ?BiPAP with 5 cm pressure spread, ST 10 .  ?

## 2021-08-19 NOTE — Progress Notes (Addendum)
? ? ? ?SLEEP MEDICINE CLINIC ?  ? ?Provider:  Larey Seat, MD  ?Primary Care Physician:  [name], MD ?[address] ?[place] [id number]  ? ?  ?Referring Provider: [name], Md ?[address] ?[address] ?[place],  [place] [address]  ?  ?  ?    ?Chief Complaint according to patient   ?Patient presents with:  ?  ? New Patient (Initial Visit)  ?     ?  ?  ?HISTORY OF PRESENT ILLNESS:  ?[name] [name] is a 81 y.o.  Caucasian male patient seen here as a referral on 08/19/2021  for RV : ? ? ?Chief concern according to patient :  "Internal referral for repeat sleep study. Prior SS done in [place] about 7 years ago, pt was unable to complete study. Pt states his mouth drops open and is having a hard time sleeping at night. Pt states he has noticed some memory concerns. Pt believes his poor sleep has been affecting his memory. Would like to learn more about inspire".  ? ?He can't get inspire which is needing remote control by magnet, he is a defibrillator, pacemaker patient.  ?The patient split-night titration from March 08, 2021 had shown a AHI apnea-hypopnea index of high severity overall 42.8/h in supine sleep the AHI exacerbated to 65 so sleeping on his back is truly making apnea worse.  The lowest oxygen saturation was 84% and the patient had 52 minutes of low oxygen at his baseline.  He was first titrated to CPAP at 9 cmH2O but no significant reduction in apnea index was found.  He was therefore invited back for a BiPAP titration which took place on 04-25-2021 and the pressures applied here were 10 over 5 cm water pressure BiPAP under which she slept for 20 minutes, then 10/5 with 10 ST.  Now he slept 100% of the test the time next was an increase in pressure to 12/7 with 10 respiratory rescue breath here he slept only 64% of the test time but his AHI was still at 64.8 and the last pressure was 13 over 8 cm 8 cm water BiPAP with 10 ST now he slept 80% of the test time  which was a total of 979.5 minutes with an AHI of 11.32 there is not a clear resolution but the left definitely a significant trend is noticed.  At about 1 AM the patient adjusted the mask and then said he had enough and did not want to continue with therapy and left. ? ?  ?I have the pleasure of seeing [name] [name] today, a right-handed White or Caucasian male with a known sleep disorder.  She has a  has a past medical history of  ?A-fib ([place]) (01/2014),  ?AICD (automatic cardioverter/defibrillator) present, Anal fissure (11/10/2019), Arthritis, ASHD (arteriosclerotic heart disease) (2015), Asthma,  ?CAD (coronary artery disease), Cataract,  ?CHF (congestive heart failure) ([place]),  ?COPD (chronic obstructive pulmonary disease) ([place]), Diverticulosis,  ?GERD (gastroesophageal reflux disease), Gout (2014), Hyperlipidemia, Hypertension, Hypothyroidism,  ?LBBB (left bundle branch block),  ?Polycythemia, Prediabetes (01/2014),  Sleep apnea, and Thyroid disease. ?OBESITY - BMI 38- excluded from inspire.  ?  ?The patient had the first sleep study in the year 2016 in [place] and tested positive for OSA, was given CPAP the same night . He reportedly walked out as he couldn't tolerate the CPAP pressure.  ?  ?Sleep relevant medical history: cant sleep on his back- at night having nasal airway  obstruction, reports memory loss, Nocturia 3-5 times, Sleep walking in childhood,  Tonsillectomy in child hood,  sinus disease , deviated septum repair. Right nasion restricted.  ? ? ?Family medical /sleep history: father died at age 20 after 59 heart attacks, all his paternal uncles and brothers died under 72 years old / Mother had dementia at 93, died at 32 - was nocturnal active.  ?No DM, no HTN, no other family member on CPAP with OSA.  ?Social history:  Former Museum/gallery curator, also worked in Designer, jewellery, Patient is retired from being a Holiday representative for [employer]- and lives in a household with his second spouse, has 2  adult- biological- children, and 4 step children , 7 great grand children.  ?Tobacco use until 40 years ago- 20 ppd- .  ETOH use ; seldomly,  ?Caffeine intake ; decaffeinated coffee- 2 cups in Am and sodas, 1 a day, coke zero. Marland Kitchen ?Regular exercise in form of walking, home projects. Marland Kitchen   ?Hobbies : ? ?  ?  ?Sleep habits are as follows: The patient's dinner time is between 6-7 PM. The patient goes to bed at 2-3 AM , and continues to sleep for 7 hours, wakes for 4 bathroom breaks, the first time at 4 AM.   ? BP 97/62   Pulse 70   Ht '5\' 7"'$  (1.702 m)   Wt 232 lb 8 oz (105.5 kg)   BMI 36.41 kg/m?  ? The preferred sleep position is sideways , with the support of 1 pillow. ? Dreams are reportedly rare.  ?11  AM is the usual rise time. The patient wakes up spontaneously.  ?He reports feeling refreshed or restored in AM, with symptoms such as dry mouth,  Naps are not taken .  ?  ?Review of Systems: ?Out of a complete 14 system review, the patient complains of only the following symptoms, and all other reviewed systems are negative.:  ?Fatigue, sleepiness , snoring, fragmented sleep, Insomnia  ?  ?How likely are you to doze in the following situations: ?0 = not likely, 1 = slight chance, 2 = moderate chance, 3 = high chance ?  ?Sitting and Reading? ?Watching Television? ?Sitting inactive in a public place (theater or meeting)? ?As a passenger in a car for an hour without a break? ?Lying down in the afternoon when circumstances permit? ?Sitting and talking to someone? ?Sitting quietly after lunch without alcohol? ?In a car, while stopped for a few minutes in traffic? ?  ?Total = 2/ 24 points  ? FSS endorsed at 25/ 63 points.  ? ?Social History  ? ?Socioeconomic History  ? Marital status: Married  ?  Spouse name: [name] [name]   ? Number of children: 2  ? Years of education: 68  ? Highest education level: Not on file  ?Occupational History  ? Occupation: Retired  ?Tobacco Use  ? Smoking status: Former  ?  Packs/day: 2.00  ?  Years:  25.00  ?  Pack years: 50.00  ?  Types: Cigarettes  ?  Quit date: 05/20/1983  ?  Years since quitting: 38.2  ? Smokeless tobacco: Never  ?Vaping Use  ? Vaping Use: Never used  ?Substance and Sexual Activity  ? Alcohol use: Not Currently  ?  Alcohol/week: 0.0 standard drinks  ? Drug use: No  ? Sexual activity: Not Currently  ?Other Topics Concern  ? Not on file  ?Social History Narrative  ? Lives with wife, [name]  ? Caffeine use: daily (Coffee)  ? ?Social  Determinants of Health  ? ?Financial Resource Strain: Not on file  ?Food Insecurity: Not on file  ?Transportation Needs: No Transportation Needs  ? Lack of Transportation (Medical): No  ? Lack of Transportation (Non-Medical): No  ?Physical Activity: Not on file  ?Stress: Not on file  ?Social Connections: Not on file  ? ? ?Family History  ?Problem Relation Age of Onset  ? Alzheimer's disease Mother   ? Mental illness Mother   ? Depression Mother   ? Heart disease Father 31  ?     had 5 MI's & died 35 yo  ? Hypertension Father   ? Alcohol abuse Son   ? Stroke Maternal Grandmother   ? Dementia Maternal Grandmother   ? Heart attack Son   ? ? ?Past Medical History:  ?Diagnosis Date  ? A-fib ([place]) 01/2014  ? AICD (automatic cardioverter/defibrillator) present   ? [place]  ? Anal fissure 11/10/2019  ? Arthritis   ? ASHD (arteriosclerotic heart disease) 2015  ? s/p CABG  ? Asthma   ? CAD (coronary artery disease)   ? Cataract   ? s/p left  ? CHF (congestive heart failure) ([place])   ? COPD (chronic obstructive pulmonary disease) ([place])   ? by CXR, pt unsure of this  ? Diverticulosis   ? GERD (gastroesophageal reflux disease)   ? Gout 2014  ? Hyperlipidemia   ? Hypertension   ? Hypothyroidism   ? LBBB (left bundle branch block)   ? Polycythemia   ? Pre-diabetes   ? Prediabetes 01/2014  ? Presence of permanent cardiac pacemaker   ? Sleep apnea   ? no CPAP  ? Thyroid disease   ? hypothyroid  ? ? ?Past Surgical History:  ?Procedure Laterality Date  ? CARDIAC DEFIBRILLATOR  PLACEMENT  07/2014  ? CATARACT EXTRACTION W/ INTRAOCULAR LENS IMPLANT Left   ? CORONARY ARTERY BYPASS GRAFT  11/2013  ? lumb laminectomy  [id number]  ? x 3  ? LUMBAR FUSION  2013  ? NASAL SEPTOPLASTY W/ TURBINOPLAS

## 2021-08-19 NOTE — Progress Notes (Signed)
CM sent to [facility] for new order ?

## 2021-08-20 DIAGNOSIS — I480 Paroxysmal atrial fibrillation: Secondary | ICD-10-CM | POA: Diagnosis not present

## 2021-08-20 DIAGNOSIS — Z95810 Presence of automatic (implantable) cardiac defibrillator: Secondary | ICD-10-CM | POA: Diagnosis not present

## 2021-08-20 DIAGNOSIS — Z7901 Long term (current) use of anticoagulants: Secondary | ICD-10-CM | POA: Diagnosis not present

## 2021-08-20 DIAGNOSIS — I255 Ischemic cardiomyopathy: Secondary | ICD-10-CM | POA: Diagnosis not present

## 2021-08-21 DIAGNOSIS — M25512 Pain in left shoulder: Secondary | ICD-10-CM | POA: Diagnosis not present

## 2021-08-26 ENCOUNTER — Ambulatory Visit: Payer: Medicare Other | Admitting: Internal Medicine

## 2021-09-02 ENCOUNTER — Other Ambulatory Visit: Payer: Self-pay

## 2021-09-02 DIAGNOSIS — C9110 Chronic lymphocytic leukemia of B-cell type not having achieved remission: Secondary | ICD-10-CM

## 2021-09-04 ENCOUNTER — Other Ambulatory Visit: Payer: Self-pay

## 2021-09-04 ENCOUNTER — Inpatient Hospital Stay (HOSPITAL_BASED_OUTPATIENT_CLINIC_OR_DEPARTMENT_OTHER): Payer: Medicare Other | Admitting: Hematology

## 2021-09-04 ENCOUNTER — Inpatient Hospital Stay: Payer: Medicare Other | Attending: Hematology

## 2021-09-04 VITALS — BP 107/62 | HR 70 | Temp 97.3°F | Resp 18 | Wt 231.6 lb

## 2021-09-04 DIAGNOSIS — D473 Essential (hemorrhagic) thrombocythemia: Secondary | ICD-10-CM | POA: Diagnosis not present

## 2021-09-04 DIAGNOSIS — Z79899 Other long term (current) drug therapy: Secondary | ICD-10-CM | POA: Diagnosis not present

## 2021-09-04 DIAGNOSIS — C9110 Chronic lymphocytic leukemia of B-cell type not having achieved remission: Secondary | ICD-10-CM

## 2021-09-04 DIAGNOSIS — Z87891 Personal history of nicotine dependence: Secondary | ICD-10-CM | POA: Insufficient documentation

## 2021-09-04 DIAGNOSIS — D45 Polycythemia vera: Secondary | ICD-10-CM | POA: Diagnosis not present

## 2021-09-04 DIAGNOSIS — Z7982 Long term (current) use of aspirin: Secondary | ICD-10-CM | POA: Diagnosis not present

## 2021-09-04 LAB — CBC WITH DIFFERENTIAL (CANCER CENTER ONLY)
Abs Immature Granulocytes: 0.11 10*3/uL — ABNORMAL HIGH (ref 0.00–0.07)
Basophils Absolute: 0.1 10*3/uL (ref 0.0–0.1)
Basophils Relative: 1 %
Eosinophils Absolute: 0.1 10*3/uL (ref 0.0–0.5)
Eosinophils Relative: 1 %
HCT: 43.2 % (ref 39.0–52.0)
Hemoglobin: 14.6 g/dL (ref 13.0–17.0)
Immature Granulocytes: 1 %
Lymphocytes Relative: 11 %
Lymphs Abs: 1.6 10*3/uL (ref 0.7–4.0)
MCH: 38.6 pg — ABNORMAL HIGH (ref 26.0–34.0)
MCHC: 33.8 g/dL (ref 30.0–36.0)
MCV: 114.3 fL — ABNORMAL HIGH (ref 80.0–100.0)
Monocytes Absolute: 0.5 10*3/uL (ref 0.1–1.0)
Monocytes Relative: 3 %
Neutro Abs: 11.7 10*3/uL — ABNORMAL HIGH (ref 1.7–7.7)
Neutrophils Relative %: 83 %
Platelet Count: 368 10*3/uL (ref 150–400)
RBC: 3.78 MIL/uL — ABNORMAL LOW (ref 4.22–5.81)
RDW: 14.6 % (ref 11.5–15.5)
WBC Count: 14 10*3/uL — ABNORMAL HIGH (ref 4.0–10.5)
nRBC: 0 % (ref 0.0–0.2)

## 2021-09-04 LAB — CMP (CANCER CENTER ONLY)
ALT: 18 U/L (ref 0–44)
AST: 24 U/L (ref 15–41)
Albumin: 4.3 g/dL (ref 3.5–5.0)
Alkaline Phosphatase: 51 U/L (ref 38–126)
Anion gap: 5 (ref 5–15)
BUN: 23 mg/dL (ref 8–23)
CO2: 31 mmol/L (ref 22–32)
Calcium: 9.6 mg/dL (ref 8.9–10.3)
Chloride: 98 mmol/L (ref 98–111)
Creatinine: 1.32 mg/dL — ABNORMAL HIGH (ref 0.61–1.24)
GFR, Estimated: 55 mL/min — ABNORMAL LOW (ref >60)
Glucose, Bld: 95 mg/dL (ref 70–99)
Potassium: 4.9 mmol/L (ref 3.5–5.1)
Sodium: 134 mmol/L — ABNORMAL LOW (ref 135–145)
Total Bilirubin: 1.1 mg/dL (ref 0.3–1.2)
Total Protein: 6.8 g/dL (ref 6.5–8.1)

## 2021-09-10 ENCOUNTER — Encounter: Payer: Self-pay | Admitting: Hematology

## 2021-09-10 NOTE — Progress Notes (Signed)
? ? ?HEMATOLOGY/ONCOLOGY CLINIC NOTE ? ?Date of Service: .09/04/2021 ? ? ?Patient Care Team: ?[name], MD as PCP - General (Internal Medicine) ?Marlane Hatcher, MD as Referring Physician (Cardiology) ?Nolene Ebbs, MD as Referring Physician (Cardiology) ?Brunetta Genera, MD as Consulting Physician (Hematology) ?Rush Landmark, [facility] as Pharmacist (Pharmacist) ? ?CHIEF COMPLAINT ?Follow-up for continued evaluation and management of JAK2 positive polycythemia vera ? ?HISTORY OF PRESENTING ILLNESS:  ?Please see previous note for details on initial presentation ? ?INTERVAL HISTORY:  ? ?[name] [name] is here for continued evaluation and management of [name]. ?He notes that he has been doing well overall but still has issues with his memory. ?He has been getting evaluated for sleep apnea but is trying to find a mask that might work for him. ?He notes no new leg swelling. ?No chest pain or shortness of breath. ?Has been compliant with his hydroxyurea and notes no new toxicities from this. ?Labs done today reviewed with him in detail. ? ?MEDICAL HISTORY:  ?Past Medical History:  ?Diagnosis Date  ? A-fib ([place]) 01/2014  ? AICD (automatic cardioverter/defibrillator) present   ? [place]  ? Anal fissure 11/10/2019  ? Arthritis   ? ASHD (arteriosclerotic heart disease) 2015  ? s/p CABG  ? Asthma   ? CAD (coronary artery disease)   ? Cataract   ? s/p left  ? CHF (congestive heart failure) ([place])   ? COPD (chronic obstructive pulmonary disease) ([place])   ? by CXR, pt unsure of this  ? Diverticulosis   ? GERD (gastroesophageal reflux disease)   ? Gout 2014  ? Hyperlipidemia   ? Hypertension   ? Hypothyroidism   ? LBBB (left bundle branch block)   ? Polycythemia   ? Pre-diabetes   ? Prediabetes 01/2014  ? Presence of permanent cardiac pacemaker   ? Sleep apnea   ? no CPAP  ? Thyroid disease   ? hypothyroid  ? ? ?SURGICAL HISTORY: ?Past Surgical History:  ?Procedure Laterality  Date  ? CARDIAC DEFIBRILLATOR PLACEMENT  07/2014  ? CATARACT EXTRACTION W/ INTRAOCULAR LENS IMPLANT Left   ? CORONARY ARTERY BYPASS GRAFT  11/2013  ? lumb laminectomy  [phone]  ? x 3  ? LUMBAR FUSION  2013  ? NASAL SEPTOPLASTY W/ TURBINOPLASTY Bilateral 01/30/2016  ? Procedure: NASAL SEPTOPLASTY WITH BILATERAL TURBINATE REDUCTION;  Surgeon: Rozetta Nunnery, MD;  Location: [place];  Service: ENT;  Laterality: Bilateral;  ? TONSILLECTOMY    ? UVULECTOMY N/A 01/30/2016  ? Procedure: UVULECTOMY;  Surgeon: Rozetta Nunnery, MD;  Location: [place];  Service: ENT;  Laterality: N/A;  ? ? ?SOCIAL HISTORY: ?Social History  ? ?Socioeconomic History  ? Marital status: Married  ?  Spouse name: [name]   ? Number of children: 2  ? Years of education: 26  ? Highest education level: Not on file  ?Occupational History  ? Occupation: Retired  ?Tobacco Use  ? Smoking status: Former  ?  Packs/day: 2.00  ?  Years: 25.00  ?  Pack years: 50.00  ?  Types: Cigarettes  ?  Quit date: 05/20/1983  ?  Years since quitting: 38.3  ? Smokeless tobacco: Never  ?Vaping Use  ? Vaping Use: Never used  ?Substance and Sexual Activity  ? Alcohol use: Not Currently  ?  Alcohol/week: 0.0 standard drinks  ? Drug use: No  ? Sexual activity: Not Currently  ?Other Topics Concern  ? Not on file  ?Social History  Narrative  ? Lives with wife, [name]  ? Caffeine use: daily (Coffee)  ? ?Social Determinants of Health  ? ?Financial Resource Strain: Not on file  ?Food Insecurity: Not on file  ?Transportation Needs: No Transportation Needs  ? Lack of Transportation (Medical): No  ? Lack of Transportation (Non-Medical): No  ?Physical Activity: Not on file  ?Stress: Not on file  ?Social Connections: Not on file  ?Intimate Partner Violence: Not on file  ? ? ?FAMILY HISTORY: ?Family History  ?Problem Relation Age of Onset  ? Alzheimer's disease Mother   ? Mental illness Mother   ? Depression Mother   ? Heart disease Father 33  ?     had 5 MI's & died 37 yo  ? Hypertension  Father   ? Alcohol abuse Son   ? Stroke Maternal Grandmother   ? Dementia Maternal Grandmother   ? Heart attack Son   ? ? ?ALLERGIES:  is allergic to other. ? ?MEDICATIONS:  ?Current Outpatient Medications  ?Medication Sig Dispense Refill  ? acetaminophen (TYLENOL) 500 MG tablet Take 500 mg by mouth as needed for mild pain.    ? allopurinol (ZYLOPRIM) 300 MG tablet TAKE 1 TABLET EVERY DAY TO PREVENT GOUT 90 tablet 3  ? aspirin EC 81 MG tablet Take 81 mg by mouth daily.    ? Cholecalciferol (VITAMIN D PO) Take 10,000 Units by mouth daily.    ? docusate sodium (COLACE) 100 MG capsule Take 100 mg by mouth 2 (two) times daily. 1 tablet in the morning and 1 tablet at bedtime (stool softener)    ? finasteride (PROSCAR) 5 MG tablet TAKE 1 TABLET EVERY DAY FOR PROSTATE 90 tablet 3  ? hydroxyurea (HYDREA) 500 MG capsule TAKE 2 CAPSULES ONE TIME DAILY WITH FOOD 180 capsule 0  ? isosorbide mononitrate (IMDUR) 30 MG 24 hr tablet Take by mouth daily.    ? levothyroxine (SYNTHROID) 200 MCG tablet TAKE 1 TAB DAILY ON EMPTY STOMACH WITH ONLY WATER FOR 30 MINS. NO ANTACIDS, CALCIUM OR MAGNESIUM FOR 4 HOURS. AVOID BIOTIN. (Patient taking differently: TAKE 1/2 TAB on Mon, Wed, Friday and take 1 whole tablet on all other days ON EMPTY STOMACH WITH ONLY WATER FOR 30 MINS. NO ANTACIDS, CALCIUM OR MAGNESIUM FOR 4 HOURS. AVOID BIOTIN.) 90 tablet 3  ? losartan (COZAAR) 100 MG tablet Take 1 tablet (100 mg total) by mouth daily. 90 tablet 3  ? magnesium oxide (MAG-OX) 400 MG tablet Take by mouth.    ? MELATONIN PO Take 1 tablet by mouth as needed.    ? metoprolol (TOPROL-XL) 200 MG 24 hr tablet Take 200 mg by mouth daily.    ? Multiple Vitamin (MULTIVITAMIN) tablet Take 1 tablet by mouth daily. A-Z    ? OVER THE COUNTER MEDICATION OTC Biotin daily.    ? polyethylene glycol powder (GLYCOLAX/MIRALAX) 17 GM/SCOOP powder Take 17 g by mouth daily.    ? Probiotic Product (PROBIOTIC-10 PO) Take by mouth daily.    ? rosuvastatin (CRESTOR) 40 MG  tablet TAKE 1 TABLET EVERY DAY FOR CHOLESTEROL 90 tablet 3  ? spironolactone (ALDACTONE) 25 MG tablet TAKE 1 TABLET EVERY DAY FOR BLOOD PRESSURE AND HEART FAILURE 90 tablet 3  ? tamsulosin (FLOMAX) 0.4 MG CAPS capsule Take 1 tablet at Bedtime for Prostate & Bladder 90 capsule 3  ? terbinafine (LAMISIL) 250 MG tablet Take 1 tablet Daily for Toenail Fungus 90 tablet 0  ? torsemide (DEMADEX) 20 MG tablet Take 1 tablet every Morning  for Swelling in legs 90 tablet 3  ? TURMERIC PO Take 1,000 mg by mouth daily.    ? warfarin (COUMADIN) 5 MG tablet Take 1 to 2 tablets Daily as Directed (Patient taking differently: Take 1 to 2 tablets Daily as Directed (Taking 1.5 on Mondays)) 145 tablet 0  ? zinc gluconate 50 MG tablet Take 50 mg by mouth daily.    ? ?No current facility-administered medications for this visit.  ? ? ?REVIEW OF SYSTEMS:   ?10 Point review of Systems was done is negative except as noted above. ? ?PHYSICAL EXAMINATION: ?Vitals:  ? 09/04/21 1431  ?BP: 107/62  ?Pulse: 70  ?Resp: 18  ?Temp: (!) 97.3 ?F (36.3 ?C)  ?SpO2: 97%  ? ? ?Filed Weights  ? 09/04/21 1431  ?Weight: 231 lb 9.6 oz (105.1 kg)  ? ? ?.Body mass index is 36.27 kg/m?.  ? ?.BP 107/62   Pulse 70   Temp (!) 97.3 ?F (36.3 ?C)   Resp 18   Wt 231 lb 9.6 oz (105.1 kg)   SpO2 97%   BMI 36.27 kg/m?  ?.. ?GENERAL:alert, in no acute distress and comfortable ?SKIN: no acute rashes, no significant lesions ?EYES: conjunctiva are pink and non-injected, sclera anicteric ?OROPHARYNX: MMM, no exudates, no oropharyngeal erythema or ulceration ?NECK: supple, no JVD ?LYMPH:  no palpable lymphadenopathy in the cervical, axillary or inguinal regions ?LUNGS: clear to auscultation b/l with normal respiratory effort ?HEART: regular rate & rhythm ?ABDOMEN:  normoactive bowel sounds , non tender, not distended. ?Extremity: no pedal edema ?PSYCH: alert & oriented x 3 with fluent speech ?NEURO: no focal motor/sensory deficits ? ?LABORATORY DATA:  ?I have reviewed the  data as listed ? ?. ? ?  Latest Ref Rng & Units 09/04/2021  ?  1:30 PM 07/04/2021  ?  3:54 PM 05/24/2021  ? 10:51 AM  ?CBC  ?WBC 4.0 - 10.5 K/uL 14.0   9.6   9.6    ?Hemoglobin 13.0 - 17.0 g/dL 14.6   14.7   15.9    ?He

## 2021-09-13 ENCOUNTER — Other Ambulatory Visit: Payer: Self-pay | Admitting: Internal Medicine

## 2021-09-13 DIAGNOSIS — I480 Paroxysmal atrial fibrillation: Secondary | ICD-10-CM

## 2021-09-18 DIAGNOSIS — I255 Ischemic cardiomyopathy: Secondary | ICD-10-CM | POA: Diagnosis not present

## 2021-09-18 DIAGNOSIS — M25512 Pain in left shoulder: Secondary | ICD-10-CM | POA: Diagnosis not present

## 2021-09-19 DIAGNOSIS — Z20822 Contact with and (suspected) exposure to covid-19: Secondary | ICD-10-CM | POA: Diagnosis not present

## 2021-10-03 DIAGNOSIS — L578 Other skin changes due to chronic exposure to nonionizing radiation: Secondary | ICD-10-CM | POA: Diagnosis not present

## 2021-10-03 DIAGNOSIS — L570 Actinic keratosis: Secondary | ICD-10-CM | POA: Diagnosis not present

## 2021-10-03 DIAGNOSIS — L814 Other melanin hyperpigmentation: Secondary | ICD-10-CM | POA: Diagnosis not present

## 2021-10-03 DIAGNOSIS — I255 Ischemic cardiomyopathy: Secondary | ICD-10-CM | POA: Diagnosis not present

## 2021-10-03 DIAGNOSIS — I480 Paroxysmal atrial fibrillation: Secondary | ICD-10-CM | POA: Diagnosis not present

## 2021-10-03 DIAGNOSIS — Z95810 Presence of automatic (implantable) cardiac defibrillator: Secondary | ICD-10-CM | POA: Diagnosis not present

## 2021-10-03 DIAGNOSIS — L821 Other seborrheic keratosis: Secondary | ICD-10-CM | POA: Diagnosis not present

## 2021-10-09 ENCOUNTER — Telehealth: Payer: Self-pay

## 2021-10-09 NOTE — Telephone Encounter (Signed)
Tamsulosin HCL 0.4 mg is approved 05/19/21 through 05/18/22. Per [employer]'s letter dated 10/09/21.

## 2021-10-10 ENCOUNTER — Encounter: Payer: Self-pay | Admitting: Gastroenterology

## 2021-10-10 ENCOUNTER — Other Ambulatory Visit (INDEPENDENT_AMBULATORY_CARE_PROVIDER_SITE_OTHER): Payer: Medicare Other

## 2021-10-10 ENCOUNTER — Ambulatory Visit (INDEPENDENT_AMBULATORY_CARE_PROVIDER_SITE_OTHER): Payer: Medicare Other | Admitting: Gastroenterology

## 2021-10-10 VITALS — BP 140/78 | HR 57 | Ht 67.0 in | Wt 229.8 lb

## 2021-10-10 DIAGNOSIS — R110 Nausea: Secondary | ICD-10-CM

## 2021-10-10 DIAGNOSIS — R634 Abnormal weight loss: Secondary | ICD-10-CM

## 2021-10-10 DIAGNOSIS — K645 Perianal venous thrombosis: Secondary | ICD-10-CM | POA: Diagnosis not present

## 2021-10-10 DIAGNOSIS — R1084 Generalized abdominal pain: Secondary | ICD-10-CM | POA: Diagnosis not present

## 2021-10-10 DIAGNOSIS — I2510 Atherosclerotic heart disease of native coronary artery without angina pectoris: Secondary | ICD-10-CM | POA: Diagnosis not present

## 2021-10-10 DIAGNOSIS — R630 Anorexia: Secondary | ICD-10-CM

## 2021-10-10 LAB — BASIC METABOLIC PANEL
BUN: 35 mg/dL — ABNORMAL HIGH (ref 6–23)
CO2: 26 mEq/L (ref 19–32)
Calcium: 9.4 mg/dL (ref 8.4–10.5)
Chloride: 97 mEq/L (ref 96–112)
Creatinine, Ser: 1.42 mg/dL (ref 0.40–1.50)
GFR: 46.49 mL/min — ABNORMAL LOW (ref >60.00)
Glucose, Bld: 114 mg/dL — ABNORMAL HIGH (ref 70–99)
Potassium: 4.6 mEq/L (ref 3.5–5.1)
Sodium: 133 mEq/L — ABNORMAL LOW (ref 135–145)

## 2021-10-10 MED ORDER — HYDROCORTISONE (PERIANAL) 2.5 % EX CREA: 1.0000 "application " | TOPICAL_CREAM | Freq: Three times a day (TID) | CUTANEOUS | 1 refills | Status: DC

## 2021-10-10 NOTE — Patient Instructions (Addendum)
Your provider has requested that you go to the basement level for lab work before leaving today. Press "B" on the elevator. The lab is located at the first door on the left as you exit the elevator.  BMET  We have sent the following medications to your pharmacy for you to pick up at your convenience: Hydrocortisone cream  We will refer you to Leighton Ruff at [facility] and they will contact you to schedule the appointment   You will be contacted by [facility] in the next 2 days to arrange a CT Abdomen/Pelvis.  The number on your caller ID will be [phone], please answer when they call.  If you have not heard from them in 2 days please call [phone] to schedule.     You have been scheduled for a CT scan of the abdomen and pelvis at [facility], 1st floor Radiology. You are scheduled on ______  at _______. You should arrive 15 minutes prior to your appointment time for registration.  Please pick up 2 bottles of contrast from [place] at least 3 days prior to your scan. The solution may taste better if refrigerated, but do NOT add ice or any other liquid to this solution. Shake well before drinking.   Please follow the written instructions below on the day of your exam:   1) Do not eat anything after ______ (4 hours prior to your test)   2) Drink 1 bottle of contrast @ ________ (2 hours prior to your exam)  Remember to shake well before drinking and do NOT pour over ice.     Drink 1 bottle of contrast @ ________ (1 hour prior to your exam)   You may take any medications as prescribed with a small amount of water, if necessary. If you take any of the following medications: METFORMIN, GLUCOPHAGE, GLUCOVANCE, AVANDAMET, RIOMET, FORTAMET, [place] MET, JANUMET, GLUMETZA or METAGLIP, you MAY be asked to HOLD this medication 48 hours AFTER the exam.   The purpose of you drinking the oral contrast is to aid in the visualization of your intestinal tract.  The contrast solution may cause some diarrhea. Depending on your individual set of symptoms, you may also receive an intravenous injection of x-ray contrast/dye. Plan on being at [facility] for 45 minutes or longer, depending on the type of exam you are having performed.   If you have any questions regarding your exam or if you need to reschedule, you may call [facility] at [phone] between the hours of 8:00 am and 5:00 pm, Monday-Friday.    A thrombosed hemorrhoid occurs when a blood clot forms inside a hemorrhoidal vein, obstructing blood flow and causing a painful swelling of the anal tissues. Thrombosed hemorrhoids are not dangerous, but they can be very painful and cause rectal bleeding if they become ulcerated.     Due to recent changes in healtHemorrhoids Hemorrhoids are swollen veins that may develop: In the butt (rectum). These are called internal hemorrhoids. Around the opening of the butt (anus). These are called external hemorrhoids. Hemorrhoids can cause pain, itching, or bleeding. Most of the time, they do not cause serious problems. They usually get better with diet changes, lifestyle changes, and other home treatments. What are the causes? This condition may be caused by: Having trouble pooping (constipation). Pushing hard (straining) to poop. Watery poop (diarrhea). Pregnancy. Being very overweight (obese). Sitting for long periods of time. Heavy lifting or other activity that causes you  to strain. Anal sex. Riding a bike for a long period of time. What are the signs or symptoms? Symptoms of this condition include: Pain. Itching or soreness in the butt. Bleeding from the butt. Leaking poop. Swelling in the area. One or more lumps around the opening of your butt. How is this diagnosed? A doctor can often diagnose this condition by looking at the affected area. The doctor may also: Do an exam that involves feeling the area with a gloved hand (digital  rectal exam). Examine the area inside your butt using a small tube (anoscope). Order blood tests. This may be done if you have lost a lot of blood. Have you get a test that involves looking inside the colon using a flexible tube with a camera on the end (sigmoidoscopy or colonoscopy). How is this treated? This condition can usually be treated at home. Your doctor may tell you to change what you eat, make lifestyle changes, or try home treatments. If these do not help, procedures can be done to remove the hemorrhoids or make them smaller. These may involve: Placing rubber bands at the base of the hemorrhoids to cut off their blood supply. Injecting medicine into the hemorrhoids to shrink them. Shining a type of light energy onto the hemorrhoids to cause them to fall off. Doing surgery to remove the hemorrhoids or cut off their blood supply. Follow these instructions at home: Eating and drinking  Eat foods that have a lot of fiber in them. These include whole grains, beans, nuts, fruits, and vegetables. Ask your doctor about taking products that have added fiber (fibersupplements). Reduce the amount of fat in your diet. You can do this by: Eating low-fat dairy products. Eating less red meat. Avoiding processed foods. Drink enough fluid to keep your pee (urine) pale yellow. Managing pain and swelling  Take a warm-water bath (sitz bath) for 20 minutes to ease pain. Do this 3-4 times a day. You may do this in a bathtub or using a portable sitz bath that fits over the toilet. If told, put ice on the painful area. It may be helpful to use ice between your warm baths. Put ice in a plastic bag. Place a towel between your skin and the bag. Leave the ice on for 20 minutes, 2-3 times a day. General instructions Take over-the-counter and prescription medicines only as told by your doctor. Medicated creams and medicines may be used as told. Exercise often. Ask your doctor how much and what kind of  exercise is best for you. Go to the bathroom when you have the urge to poop. Do not wait. Avoid pushing too hard when you poop. Keep your butt dry and clean. Use wet toilet paper or moist towelettes after pooping. Do not sit on the toilet for a long time. Keep all follow-up visits as told by your doctor. This is important. Contact a doctor if you: Have pain and swelling that do not get better with treatment or medicine. Have trouble pooping. Cannot poop. Have pain or swelling outside the area of the hemorrhoids. Get help right away if you have: Bleeding that will not stop. Summary Hemorrhoids are swollen veins in the butt or around the opening of the butt. They can cause pain, itching, or bleeding. Eat foods that have a lot of fiber in them. These include whole grains, beans, nuts, fruits, and vegetables. Take a warm-water bath (sitz bath) for 20 minutes to ease pain. Do this 3-4 times a day. This information is  not intended to replace advice given to you by your health care provider. Make sure you discuss any questions you have with your health care provider. Document Revised: 11/14/2020 Document Reviewed: 11/14/2020 Elsevier Patient Education  [place] laws, you may see the results of your imaging and laboratory studies on MyChart before your provider has had a chance to review them.  We understand that in some cases there may be results that are confusing or concerning to you. Not all laboratory results come back in the same time frame and the provider may be waiting for multiple results in order to interpret others.  Please give [place] 48 hours in order for your provider to thoroughly review all the results before contacting the office for clarification of your results.    If you are age 30 or older, your body mass index should be between 23-30. Your Body mass index is 35.99 kg/m. If this is out of the aforementioned range listed, please consider follow up with your Primary  Care Provider.  If you are age 47 or younger, your body mass index should be between 19-25. Your Body mass index is 35.99 kg/m. If this is out of the aformentioned range listed, please consider follow up with your Primary Care Provider.   ________________________________________________________  The [place] [facility] providers would like to encourage you to use [facility] to communicate with providers for non-urgent requests or questions.  Due to long hold times on the telephone, sending your provider a message by [facility] may be a faster and more efficient way to get a response.  Please allow 48 business hours for a response.  Please remember that this is for non-urgent requests.  _______________________________________________________   I appreciate the  opportunity to care for you  Thank You   Janett Billow Zehr,PA-C

## 2021-10-10 NOTE — Progress Notes (Signed)
10/10/2021 [name] [name] [id number] [birth year]   HISTORY OF PRESENT ILLNESS: This is a 81 year old male who is a patient of Dr. Lynne Leader.  He is here today with complaints of a hemorrhoid with rectal pain and bleeding.  He says that he noticed that it came on suddenly about 2 weeks ago.  Still without any improvement.  While he is here he also mentions some generalized abdominal discomfort with nausea.  Says that his appetite is rather poor.  Has lost 20 pounds over the past year due to decrease in p.o. intake related to poor appetite.  He was seen earlier this year for complaints of dysphagia.  He says that seems to be going okay right now.  Esophagram showed dysmotility and a Zenker's diverticulum.  Past Medical History:  Diagnosis Date   A-fib ([place]) 01/2014   AICD (automatic cardioverter/defibrillator) present    [employer]   Anal fissure 11/10/2019   Arthritis    ASHD (arteriosclerotic heart disease) 2015   s/p CABG   Asthma    CAD (coronary artery disease)    Cataract    s/p left   CHF (congestive heart failure) ([facility])    COPD (chronic obstructive pulmonary disease) ([place])    by CXR, pt unsure of this   Diverticulosis    GERD (gastroesophageal reflux disease)    Gout 2014   Hyperlipidemia    Hypertension    Hypothyroidism    LBBB (left bundle branch block)    Polycythemia    Pre-diabetes    Prediabetes 01/2014   Presence of permanent cardiac pacemaker    Sleep apnea    no CPAP   Thyroid disease    hypothyroid   Past Surgical History:  Procedure Laterality Date   CARDIAC DEFIBRILLATOR PLACEMENT  07/2014   CATARACT EXTRACTION W/ INTRAOCULAR LENS IMPLANT Left    CORONARY ARTERY BYPASS GRAFT  11/2013   lumb laminectomy  [phone]   x 3   LUMBAR FUSION  2013   NASAL SEPTOPLASTY W/ TURBINOPLASTY Bilateral 01/30/2016   Procedure: NASAL SEPTOPLASTY WITH BILATERAL TURBINATE REDUCTION;  Surgeon: Rozetta Nunnery, MD;  Location: [place];  Service: ENT;   Laterality: Bilateral;   TONSILLECTOMY     UVULECTOMY N/A 01/30/2016   Procedure: Martyn Ehrich;  Surgeon: Rozetta Nunnery, MD;  Location: [place];  Service: ENT;  Laterality: N/A;    reports that he quit smoking about 38 years ago. His smoking use included cigarettes. He has a 50.00 pack-year smoking history. He has never used smokeless tobacco. He reports that he does not currently use alcohol. He reports that he does not use drugs. family history includes Alcohol abuse in his son; Alzheimer's disease in his mother; Dementia in his maternal grandmother; Depression in his mother; Heart attack in his son; Heart disease (age of onset: 78) in his father; Hypertension in his father; Mental illness in his mother; Stroke in his maternal grandmother. Allergies  Allergen Reactions   Other Swelling    SODIUM SENSITIVE      Outpatient Encounter Medications as of 10/10/2021  Medication Sig   acetaminophen (TYLENOL) 500 MG tablet Take 500 mg by mouth as needed for mild pain.   allopurinol (ZYLOPRIM) 300 MG tablet TAKE 1 TABLET EVERY DAY TO PREVENT GOUT   aspirin EC 81 MG tablet Take 81 mg by mouth daily.   Cholecalciferol (VITAMIN D PO) Take 10,000 Units by mouth daily.   docusate sodium (COLACE) 100 MG capsule Take 100 mg by mouth  2 (two) times daily. 1 tablet in the morning and 1 tablet at bedtime (stool softener)   finasteride (PROSCAR) 5 MG tablet TAKE 1 TABLET EVERY DAY FOR PROSTATE   hydroxyurea (HYDREA) 500 MG capsule TAKE 2 CAPSULES ONE TIME DAILY WITH FOOD   isosorbide mononitrate (IMDUR) 30 MG 24 hr tablet Take by mouth daily.   levothyroxine (SYNTHROID) 200 MCG tablet TAKE 1 TAB DAILY ON EMPTY STOMACH WITH ONLY WATER FOR 30 MINS. NO ANTACIDS, CALCIUM OR MAGNESIUM FOR 4 HOURS. AVOID BIOTIN. (Patient taking differently: TAKE 1/2 TAB on Mon, Wed, Friday and take 1 whole tablet on all other days ON EMPTY STOMACH WITH ONLY WATER FOR 30 MINS. NO ANTACIDS, CALCIUM OR MAGNESIUM FOR 4 HOURS. AVOID  BIOTIN.)   losartan (COZAAR) 100 MG tablet Take 1 tablet (100 mg total) by mouth daily.   magnesium oxide (MAG-OX) 400 MG tablet Take by mouth.   MELATONIN PO Take 1 tablet by mouth as needed.   metoprolol (TOPROL-XL) 200 MG 24 hr tablet Take 200 mg by mouth daily.   Multiple Vitamin (MULTIVITAMIN) tablet Take 1 tablet by mouth daily. A-Z   Probiotic Product (PROBIOTIC-10 PO) Take by mouth daily.   rosuvastatin (CRESTOR) 40 MG tablet TAKE 1 TABLET EVERY DAY FOR CHOLESTEROL   spironolactone (ALDACTONE) 25 MG tablet TAKE 1 TABLET EVERY DAY FOR BLOOD PRESSURE AND HEART FAILURE   tamsulosin (FLOMAX) 0.4 MG CAPS capsule Take 1 tablet at Bedtime for Prostate & Bladder   terbinafine (LAMISIL) 250 MG tablet Take 1 tablet Daily for Toenail Fungus   torsemide (DEMADEX) 20 MG tablet Take 1 tablet every Morning for Swelling in legs   TURMERIC PO Take 1,000 mg by mouth daily.   warfarin (COUMADIN) 5 MG tablet TAKE 1 TO 2 TABLETS EVERY DAY AS DIRECTED   [DISCONTINUED] OVER THE COUNTER MEDICATION OTC Biotin daily.   [DISCONTINUED] polyethylene glycol powder (GLYCOLAX/MIRALAX) 17 GM/SCOOP powder Take 17 g by mouth daily.   [DISCONTINUED] zinc gluconate 50 MG tablet Take 50 mg by mouth daily.   No facility-administered encounter medications on file as of 10/10/2021.     REVIEW OF SYSTEMS  : All other systems reviewed and negative except where noted in the History of Present Illness.   PHYSICAL EXAM: BP 140/78   Pulse (!) 57   Ht '5\' 7"'$  (1.702 m)   Wt 229 lb 12.8 oz (104.2 kg)   SpO2 97%   BMI 35.99 kg/m  General: Well developed white male in no acute distress Head: Normocephalic and atraumatic Eyes:  Sclerae anicteric, conjunctiva pink. Ears: Normal auditory acuity Lungs: Clear throughout to auscultation; no W/R/R. Heart: Regular rate and rhythm; no M/R/G. Abdomen: Soft, non-distended.  BS present.  Mild epigastric TTP. Rectal: Thrombosed external hemorrhoid noted with tenderness.  DRE is  otherwise without masses.  Light brown stool on the exam glove. Musculoskeletal: Symmetrical with no gross deformities  Skin: No lesions on visible extremities Extremities: No edema  Neurological: Alert oriented x 4, grossly non-focal Psychological:  Alert and cooperative. Normal mood and affect  ASSESSMENT AND PLAN: *81 year old male with complaints of generalized abdominal discomfort, slight nausea, poor appetite, and about 20 pound weight loss over the last year.  We will plan for CT scan of the abdomen and pelvis with contrast.  We will check a BMP today for renal function. *Thrombosed external hemorrhoid: Has been present now for the past couple of weeks according to his report without much improvement.  I am going to give him  hydrocortisone cream to use 3 times daily.  Prescription sent to pharmacy.  I am also going to put in a referral for him to see the surgeons.  If it resolves in the interim then he can always cancel the appointment, but may need intervention if it persists.   CC:  [name], MD

## 2021-10-11 ENCOUNTER — Telehealth: Payer: Self-pay | Admitting: *Deleted

## 2021-10-11 NOTE — Telephone Encounter (Signed)
Referral faxed to [facility]  Leighton Ruff MD for Thrombosed hemorrhoid external     Waiting on an appointment to be scheduled

## 2021-10-16 ENCOUNTER — Telehealth: Payer: Self-pay | Admitting: Adult Health

## 2021-10-16 ENCOUNTER — Encounter: Payer: Self-pay | Admitting: Adult Health

## 2021-10-16 ENCOUNTER — Ambulatory Visit (INDEPENDENT_AMBULATORY_CARE_PROVIDER_SITE_OTHER): Payer: Medicare Other | Admitting: Adult Health

## 2021-10-16 VITALS — BP 108/64 | HR 73 | Temp 96.6°F | Wt 229.0 lb

## 2021-10-16 DIAGNOSIS — R7309 Other abnormal glucose: Secondary | ICD-10-CM

## 2021-10-16 DIAGNOSIS — G4733 Obstructive sleep apnea (adult) (pediatric): Secondary | ICD-10-CM

## 2021-10-16 DIAGNOSIS — I2510 Atherosclerotic heart disease of native coronary artery without angina pectoris: Secondary | ICD-10-CM

## 2021-10-16 DIAGNOSIS — D471 Chronic myeloproliferative disease: Secondary | ICD-10-CM

## 2021-10-16 DIAGNOSIS — J449 Chronic obstructive pulmonary disease, unspecified: Secondary | ICD-10-CM

## 2021-10-16 DIAGNOSIS — I480 Paroxysmal atrial fibrillation: Secondary | ICD-10-CM | POA: Diagnosis not present

## 2021-10-16 DIAGNOSIS — E782 Mixed hyperlipidemia: Secondary | ICD-10-CM

## 2021-10-16 DIAGNOSIS — E039 Hypothyroidism, unspecified: Secondary | ICD-10-CM | POA: Diagnosis not present

## 2021-10-16 DIAGNOSIS — Z79899 Other long term (current) drug therapy: Secondary | ICD-10-CM

## 2021-10-16 DIAGNOSIS — D45 Polycythemia vera: Secondary | ICD-10-CM | POA: Diagnosis not present

## 2021-10-16 DIAGNOSIS — I10 Essential (primary) hypertension: Secondary | ICD-10-CM

## 2021-10-16 DIAGNOSIS — E559 Vitamin D deficiency, unspecified: Secondary | ICD-10-CM | POA: Diagnosis not present

## 2021-10-16 DIAGNOSIS — D6869 Other thrombophilia: Secondary | ICD-10-CM

## 2021-10-16 DIAGNOSIS — M25512 Pain in left shoulder: Secondary | ICD-10-CM | POA: Diagnosis not present

## 2021-10-16 DIAGNOSIS — K225 Diverticulum of esophagus, acquired: Secondary | ICD-10-CM

## 2021-10-16 NOTE — Telephone Encounter (Signed)
Says he has notes that you had written and explained to him at his appt. Today but he doesn't understand them and would like some clarification...requesting phone call from you when you have a moment.

## 2021-10-16 NOTE — Patient Instructions (Signed)
      General surgery referral for hemorrhoids [facility] Surgery Dr. Leighton Ruff [phone]  To schedule your CT scan - probably a [place] number as the test was ordered to be done at [facility] - check your phone call log and return call to the number to schedule, or can call your GI office for help if needed:  [phone]  Recommend to contact Dr. Edwena Felty office to follow up on BiPAP machine status -  [phone]

## 2021-10-16 NOTE — Progress Notes (Signed)
3 MONTH FOLLOW UP Assessment:     Tortuous aorta (HCC) - per CXR 2018 Control blood pressure, cholesterol, glucose, increase exercise.   Senile purpura (HCC) Discussed process, protect skin, sunscreen  OSA and COPD overlap syndrome (HCC)  Didn't tolerate CPAP Dr. Brett Fairy now following, recommended BiPAP - given phone number to her office to follow up on status weight loss still advised.  Rare albuterol  Other hemorrhoids GI following, continue bowel regimen Pending Gen surgery evaluation for resection - given [place] phone number to follow up   Essential hypertension At goal; continue medications Monitor blood pressure at home; call if consistently over 130/80 Continue DASH diet.   Reminder to go to the ER if any CP, SOB, nausea, dizziness, severe HA, changes vision/speech, left arm numbness and tingling and jaw pain.  Paroxysmal atrial fibrillation ([place]) Continue follow up with a fib clinic for coumadin management Discussed if patient falls to immediately contact office or go to ER. Discussed foods that can increase or decrease Coumadin levels. Patient understands to call the office before starting a new medication.  Acquired thrombophilia ([place]) A. Fib and hematological conditions; treated by coumadin - followed by a. Fib clinic.   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 Followed by cardiology    Gastroesophageal reflux disease, esophagitis presence not specified Well controlled sx at this time off of meds Discussed diet, avoiding triggers and other lifestyle changes  Hypothyroidism, unspecified type continue medications the same pending labs reminded to take on an empty stomach 30-53mns before food, 4+ hours apart from reflux meds -     TSH  Mixed hyperlipidemia At goal for LDL; continue statin- discussed diet for borderline trigs Continue low cholesterol diet and exercise.  -     Lipid panel  Abnormal  glucose Discussed disease and risks of elevated glucose Discussed diet/exercise, weight management  -     Hemoglobin A1c - defer, last at goal   Vitamin D deficiency At goal; continue supplementation Defer vitamin D level  Medication management -     CBC with Differential/Platelet -     CMP/GFR -     Magnesium  Morbid [place]- BMI 30+ with OSA Long discussion about weight loss, diet, and exercise Recommended diet heavy in fruits and veggies and low in animal meats, cheeses, and dairy products, appropriate calorie intake increase regular exercise Follow up in 3 months  Polycythemia Vera (HCC)/ MPN (HCC)/ Thrombocytosis Followed by Dr. KIrene Limbo on hydroxyurea 500 mg  Monitor CBC  Orders Placed This Encounter  Procedures   CBC with Differential/Platelet   COMPLETE METABOLIC PANEL WITH GFR   Magnesium   Lipid panel   TSH       Over 30 minutes of exam, counseling, chart review, and critical decision making was performed  Future Appointments  Date Time Provider [place] 11/05/2021 11:45 AM BCarleene Mains RPH [facility] None  12/10/2021  1:15 PM [facility] None  12/11/2021 10:00 AM [facility] Genera [facility] [address]  03/11/2022 10:00 AM CLiane Comber NP [facility] None  07/10/2022  2:00 PM MUnk Pinto MD [facility] None    Subjective:  [name] [name] is a 81y.o. male who presents for 3 month follow up for HTN, hyperlipidemia, hx of prediabetes, morbid obesity and vitamin D Def.    In 2015, he underwent a CABG x 3 V w/po Afib- and failed 2 attempts at VCV. Then in 07/2014, he had a BiVent, AICD/Defib/PM planted. He  is followed at his Cardiologist (Dr. Georg Ruddle) coumadin clinic.  Patient denies any cardiac symptoms as chest pain, palpitations, shortness of breath, dizziness or ankle swelling. He has dx of sleep apnea but did not tolerate CPAP at that time.   He demonstrated slowing of memory, having more difficulty recalling  names, doing well managing meds with pill boxes.   Recently established with Dr. Brett Fairy and recommended BiPAP but patient states still hasn't received the machine 2 months later -   he has a diagnosis of GERD which is currently managed by lifestyle, denies breakthrough sx.   He has history of hemorrhoids, worse with constipation. Last colonoscopy 2008, cologuard neg 2019. He takes colace BID to help prevent constipation. Was having persistent sx recently, was evaluated by GI 09/2021, prescribed steroid cream and referred to gen surgery for hemorrhoid, so far hasn't noted improvement with topical. Also CT abd/pelvis pending due to unexplained weight loss -   BMI is Body mass index is 35.87 kg/m., he admits hasn't been intentionally exercising, but does some house projects, works in yard trimming trees. He does occasionally use treadmill, step machine, rower at home. Has noted less of an appetite in recent years and eating smaller portions.  Wt Readings from Last 3 Encounters:  10/16/21 229 lb (103.9 kg)  10/10/21 229 lb 12.8 oz (104.2 kg)  09/04/21 231 lb 9.6 oz (105.1 kg)   His blood pressure has been controlled at home, today their BP is BP: 108/64 He does workout. He denies chest pain, shortness of breath, dizziness.   He is on cholesterol medication (atorvastatin 40 mg daily) and denies myalgias. His LDL cholesterol is at goal. The cholesterol last visit was:   Lab Results  Component Value Date   CHOL 121 07/04/2021   HDL 22 (L) 07/04/2021   LDLCALC 76 07/04/2021   TRIG 144 07/04/2021   CHOLHDL 5.5 (H) 07/04/2021   He has been working on diet and exercise for hx of prediabetes, and denies increased appetite, nausea, paresthesia of the feet, polydipsia, polyuria, visual disturbances, vomiting and weight loss. Last A1C in the office was:  Lab Results  Component Value Date   HGBA1C 5.2 07/04/2021   He is on thyroid medication. His medication was not changed last visit. He is taking  levothyroxine 200 mcg daily, has been taking 30 min prior to other food or meds since last vist.  Lab Results  Component Value Date   TSH 1.58 08/13/2021   Last GFR Lab Results  Component Value Date   EGFR 73 07/04/2021   Patient is on Vitamin D supplement and at goal at recent check:    Lab Results  Component Value Date   VD25OH 85 07/04/2021     Patient is on allopurinol for gout and does not report a recent flare.  Lab Results  Component Value Date   LABURIC 4.5 08/13/2021   Patient has Polycythemia Vera, myeloproliferative neoplasm, followed by Dr.  Irene Limbo at [facility] and treated by hydroxyurea and periodic therapeutic phlebotomies.     Latest Ref Rng & Units 09/04/2021    1:30 PM 07/04/2021    3:54 PM 05/24/2021   10:51 AM  CBC  WBC 4.0 - 10.5 K/uL 14.0   9.6   9.6    Hemoglobin 13.0 - 17.0 g/dL 14.6   14.7   15.9    Hematocrit 39.0 - 52.0 % 43.2   41.4   47.2    Platelets 150 - 400 K/uL 368  321   317       Medication Review:  Current Outpatient Medications (Endocrine & Metabolic):    levothyroxine (SYNTHROID) 200 MCG tablet, TAKE 1 TAB DAILY ON EMPTY STOMACH WITH ONLY WATER FOR 30 MINS. NO ANTACIDS, CALCIUM OR MAGNESIUM FOR 4 HOURS. AVOID BIOTIN. (Patient taking differently: TAKE 1/2 TAB on Mon, Wed, Friday and take 1 whole tablet on all other days ON EMPTY STOMACH WITH ONLY WATER FOR 30 MINS. NO ANTACIDS, CALCIUM OR MAGNESIUM FOR 4 HOURS. AVOID BIOTIN.)  Current Outpatient Medications (Cardiovascular):    isosorbide mononitrate (IMDUR) 30 MG 24 hr tablet, Take by mouth daily.   losartan (COZAAR) 100 MG tablet, Take 1 tablet (100 mg total) by mouth daily.   metoprolol (TOPROL-XL) 200 MG 24 hr tablet, Take 200 mg by mouth daily.   rosuvastatin (CRESTOR) 40 MG tablet, TAKE 1 TABLET EVERY DAY FOR CHOLESTEROL   spironolactone (ALDACTONE) 25 MG tablet, TAKE 1 TABLET EVERY DAY FOR BLOOD PRESSURE AND HEART FAILURE   torsemide (DEMADEX) 20 MG tablet, Take 1  tablet every Morning for Swelling in legs   Current Outpatient Medications (Analgesics):    acetaminophen (TYLENOL) 500 MG tablet, Take 500 mg by mouth as needed for mild pain.   allopurinol (ZYLOPRIM) 300 MG tablet, TAKE 1 TABLET EVERY DAY TO PREVENT GOUT   aspirin EC 81 MG tablet, Take 81 mg by mouth daily.  Current Outpatient Medications (Hematological):    warfarin (COUMADIN) 5 MG tablet, TAKE 1 TO 2 TABLETS EVERY DAY AS DIRECTED  Current Outpatient Medications (Other):    Cholecalciferol (VITAMIN D PO), Take 10,000 Units by mouth daily.   docusate sodium (COLACE) 100 MG capsule, Take 100 mg by mouth 2 (two) times daily. 1 tablet in the morning and 1 tablet at bedtime (stool softener)   finasteride (PROSCAR) 5 MG tablet, TAKE 1 TABLET EVERY DAY FOR PROSTATE   hydrocortisone (ANUSOL-HC) 2.5 % rectal cream, Place 1 application. rectally 3 (three) times daily. Use a small amount per rectum for external hemorrhoid for 10 days   hydroxyurea (HYDREA) 500 MG capsule, TAKE 2 CAPSULES ONE TIME DAILY WITH FOOD   magnesium oxide (MAG-OX) 400 MG tablet, Take by mouth.   MELATONIN PO, Take 1 tablet by mouth as needed.   Multiple Vitamin (MULTIVITAMIN) tablet, Take 1 tablet by mouth daily. A-Z   Probiotic Product (PROBIOTIC-10 PO), Take by mouth daily.   tamsulosin (FLOMAX) 0.4 MG CAPS capsule, Take 1 tablet at Bedtime for Prostate & Bladder   terbinafine (LAMISIL) 250 MG tablet, Take 1 tablet Daily for Toenail Fungus   TURMERIC PO, Take 1,000 mg by mouth daily.  Allergies: Allergies  Allergen Reactions   Other Swelling    SODIUM SENSITIVE    Current Problems (verified) has HTN (hypertension); Hyperlipidemia, mixed; Vitamin D deficiency; GERD ; BPH (benign prostatic hyperplasia); Medication management; Atrial fibrillation ([place]); Hypothyroidism; OSA and COPD overlap syndrome ([place]); Morbid obesity ([place]); ASHD/CABG x 3V (11/2013); Acquired thrombophilia ([place]); Cardiac pacemaker/AICD (01/2014);  Emphysema of lung ([place]); Tortuous aorta ([place])- CXR 2018; Senile purpura ([place]); Thrombocytosis; Polycythemia vera ([place]); Memory changes; Abnormal glucose; Rectal pain; Constipation; MPN (myeloproliferative neoplasm) ([place]); JAK2 V617F mutation; Complex sleep apnea syndrome; Intolerance of continuous positive airway pressure (CPAP) ventilation; Dysphagia; Nausea without vomiting; Loss of appetite; Loss of weight; Generalized abdominal pain; and Thrombosed external hemorrhoid on their problem list.  Surgical: He  has a past surgical history that includes Coronary artery bypass graft (11/2013); lumb laminectomy (4008,6761,9509); Lumbar fusion (2013); Cardiac defibrillator  placement (07/2014); Cataract extraction w/ intraocular lens implant (Left); Nasal septoplasty w/ turbinoplasty (Bilateral, 01/30/2016); Uvulectomy (N/A, 01/30/2016); and Tonsillectomy. Family His family history includes Alcohol abuse in his son; Alzheimer's disease in his mother; Dementia in his maternal grandmother; Depression in his mother; Heart attack in his son; Heart disease (age of onset: 35) in his father; Hypertension in his father; Mental illness in his mother; Stroke in his maternal grandmother. Social history  He reports that he quit smoking about 38 years ago. His smoking use included cigarettes. He has a 50.00 pack-year smoking history. He has never used smokeless tobacco. He reports that he does not currently use alcohol. He reports that he does not use drugs.   Review of Systems  Constitutional:  Negative for malaise/fatigue and weight loss.  HENT:  Negative for hearing loss and tinnitus.   Eyes:  Negative for blurred vision and double vision.  Respiratory:  Negative for cough, shortness of breath and wheezing.   Cardiovascular:  Negative for chest pain, palpitations, orthopnea, claudication and leg swelling.  Gastrointestinal:  Positive for nausea (mild in AM, GI working up). Negative for abdominal pain, blood in stool,  constipation, diarrhea, heartburn, melena and vomiting.  Genitourinary: Negative.   Musculoskeletal:  Negative for joint pain and myalgias.  Skin:  Negative for rash.  Neurological:  Negative for dizziness, tingling, sensory change, weakness and headaches.  Endo/Heme/Allergies:  Negative for polydipsia.  Psychiatric/Behavioral:  Positive for memory loss (stable, poor short term recall). Negative for depression and substance abuse. The patient is not nervous/anxious.   All other systems reviewed and are negative.   Objective:   Today's Vitals   10/16/21 1151  BP: 108/64  Pulse: 73  Temp: (!) 96.6 F (35.9 C)  SpO2: 96%  Weight: 229 lb (103.9 kg)     Body mass index is 35.87 kg/m.  General appearance: alert, obese, no distress, WD/WN, male HEENT: normocephalic, sclerae anicteric, TMs pearly, nares patent, no discharge or erythema, pharynx normal Oral cavity: MMM, no lesions Neck: supple, no lymphadenopathy, no thyromegaly, no masses Heart: RRR, normal S1, S2, mild 2/6 systolic murmur Lungs: Present throughout, symmetrical, No rhonchi, or rales or wheezing Abdomen: +bs, soft, non tender, non distended, no masses, no hepatomegaly, no splenomegaly Musculoskeletal: nontender, no swelling, no obvious deformity Extremities: no edema, no cyanosis, no clubbing Pulses: 2+ symmetric, upper and lower extremities, normal cap refill Neurological: alert, oriented x 3, CN2-12 intact, strength normal upper extremities and lower extremities, sensation normal throughout, DTRs 2+ throughout, no cerebellar signs, gait normal. Some slow recall.  Psychiatric: normal affect, behavior normal, pleasant     Izora Ribas, NP   10/16/2021

## 2021-10-17 ENCOUNTER — Other Ambulatory Visit: Payer: Self-pay | Admitting: Adult Health

## 2021-10-17 ENCOUNTER — Other Ambulatory Visit: Payer: Self-pay | Admitting: Internal Medicine

## 2021-10-17 DIAGNOSIS — E782 Mixed hyperlipidemia: Secondary | ICD-10-CM

## 2021-10-17 DIAGNOSIS — E875 Hyperkalemia: Secondary | ICD-10-CM

## 2021-10-17 DIAGNOSIS — N289 Disorder of kidney and ureter, unspecified: Secondary | ICD-10-CM

## 2021-10-17 LAB — CBC WITH DIFFERENTIAL/PLATELET
Absolute Monocytes: 449 cells/uL (ref 200–950)
Basophils Absolute: 82 cells/uL (ref 0–200)
Basophils Relative: 0.6 %
Eosinophils Absolute: 109 cells/uL (ref 15–500)
Eosinophils Relative: 0.8 %
HCT: 44.6 % (ref 38.5–50.0)
Hemoglobin: 16.2 g/dL (ref 13.2–17.1)
Lymphs Abs: 1510 cells/uL (ref 850–3900)
MCH: 41.4 pg — ABNORMAL HIGH (ref 27.0–33.0)
MCHC: 36.3 g/dL — ABNORMAL HIGH (ref 32.0–36.0)
MCV: 114.1 fL — ABNORMAL HIGH (ref 80.0–100.0)
MPV: 10.4 fL (ref 7.5–12.5)
Monocytes Relative: 3.3 %
Neutro Abs: 11451 cells/uL — ABNORMAL HIGH (ref 1500–7800)
Neutrophils Relative %: 84.2 %
Platelets: 402 10*3/uL — ABNORMAL HIGH (ref 140–400)
RBC: 3.91 10*6/uL — ABNORMAL LOW (ref 4.20–5.80)
RDW: 14 % (ref 11.0–15.0)
Total Lymphocyte: 11.1 %
WBC: 13.6 10*3/uL — ABNORMAL HIGH (ref 3.8–10.8)

## 2021-10-17 LAB — COMPLETE METABOLIC PANEL WITH GFR
AG Ratio: 1.8 (calc) (ref 1.0–2.5)
ALT: 25 U/L (ref 9–46)
AST: 25 U/L (ref 10–35)
Albumin: 4.6 g/dL (ref 3.6–5.1)
Alkaline phosphatase (APISO): 52 U/L (ref 35–144)
BUN/Creatinine Ratio: 24 (calc) — ABNORMAL HIGH (ref 6–22)
BUN: 31 mg/dL — ABNORMAL HIGH (ref 7–25)
CO2: 27 mmol/L (ref 20–32)
Calcium: 10 mg/dL (ref 8.6–10.3)
Chloride: 99 mmol/L (ref 98–110)
Creat: 1.3 mg/dL — ABNORMAL HIGH (ref 0.70–1.22)
Globulin: 2.5 g/dL (calc) (ref 1.9–3.7)
Glucose, Bld: 108 mg/dL — ABNORMAL HIGH (ref 65–99)
Potassium: 5.4 mmol/L — ABNORMAL HIGH (ref 3.5–5.3)
Sodium: 135 mmol/L (ref 135–146)
Total Bilirubin: 1.1 mg/dL (ref 0.2–1.2)
Total Protein: 7.1 g/dL (ref 6.1–8.1)
eGFR: 55 mL/min/{1.73_m2} — ABNORMAL LOW (ref >=60)

## 2021-10-17 LAB — LIPID PANEL
Cholesterol: 134 mg/dL (ref <200)
HDL: 29 mg/dL — ABNORMAL LOW (ref >=40)
LDL Cholesterol (Calc): 84 mg/dL (calc)
Non-HDL Cholesterol (Calc): 105 mg/dL (calc) (ref <130)
Total CHOL/HDL Ratio: 4.6 (calc) (ref <5.0)
Triglycerides: 113 mg/dL (ref <150)

## 2021-10-17 LAB — TSH: TSH: 8.07 mIU/L — ABNORMAL HIGH (ref 0.40–4.50)

## 2021-10-17 LAB — MAGNESIUM: Magnesium: 1.9 mg/dL (ref 1.5–2.5)

## 2021-10-17 MED ORDER — EZETIMIBE 10 MG PO TABS: ORAL_TABLET | ORAL | 3 refills | Status: DC

## 2021-10-17 NOTE — Telephone Encounter (Signed)
Patient aware.

## 2021-10-21 ENCOUNTER — Telehealth: Payer: Self-pay | Admitting: Gastroenterology

## 2021-10-21 NOTE — Telephone Encounter (Signed)
Patient called again wanting to follow up on message below. Please advise

## 2021-10-21 NOTE — Telephone Encounter (Signed)
The pt states that he is doing well from a pain stand point.  He is comfortable now but wants to know the status of the [facility] appt.  The referral has been made by Genella Mech CMA at his last office visit.  I offered to call pt to check on the appt status but he prefers to call the office himself.  I provided him the phone number to call.  He will let us know if he has any further questions.

## 2021-10-21 NOTE — Telephone Encounter (Signed)
Inbound call from patient stating that Janett Billow had proscribed a rectal cream to help shrink a hemorrhoid and he was supposed to use it for about a week. Patient stated he has been using it and it seems that it is not helping. Patient is seeking advice on what he needs to do moving forward. Please advise.

## 2021-10-25 NOTE — Telephone Encounter (Signed)
Spoke with Lattie Haw today, she is going to contact the patient to schedule with Dr Dema Severin. Patient has hx for anal fissure with Dr Dema Severin

## 2021-10-25 NOTE — Telephone Encounter (Signed)
Spoke with Lattie Haw today, she is going to contact the patient today to schedule with Dr Dema Severin. Patient has hx with Dr Dema Severin for a anal fissure

## 2021-10-25 NOTE — Telephone Encounter (Signed)
[facility] is behind with referrals

## 2021-10-28 ENCOUNTER — Encounter (HOSPITAL_COMMUNITY): Payer: Self-pay

## 2021-10-28 ENCOUNTER — Ambulatory Visit (HOSPITAL_COMMUNITY)
Admission: RE | Admit: 2021-10-28 | Discharge: 2021-10-28 | Disposition: A | Discharge status: Home / Self Care | Payer: Medicare Other | Source: Ambulatory Visit | Attending: Gastroenterology | Admitting: Gastroenterology

## 2021-10-28 DIAGNOSIS — R630 Anorexia: Secondary | ICD-10-CM | POA: Diagnosis not present

## 2021-10-28 DIAGNOSIS — R110 Nausea: Secondary | ICD-10-CM | POA: Insufficient documentation

## 2021-10-28 DIAGNOSIS — R634 Abnormal weight loss: Secondary | ICD-10-CM | POA: Insufficient documentation

## 2021-10-28 DIAGNOSIS — K645 Perianal venous thrombosis: Secondary | ICD-10-CM | POA: Insufficient documentation

## 2021-10-28 DIAGNOSIS — N281 Cyst of kidney, acquired: Secondary | ICD-10-CM | POA: Diagnosis not present

## 2021-10-28 DIAGNOSIS — R1084 Generalized abdominal pain: Secondary | ICD-10-CM | POA: Insufficient documentation

## 2021-10-28 DIAGNOSIS — K8020 Calculus of gallbladder without cholecystitis without obstruction: Secondary | ICD-10-CM | POA: Diagnosis not present

## 2021-10-28 DIAGNOSIS — N200 Calculus of kidney: Secondary | ICD-10-CM | POA: Diagnosis not present

## 2021-10-28 MED ORDER — SODIUM CHLORIDE (PF) 0.9 % IJ SOLN
INTRAMUSCULAR | Status: AC
  Filled 2021-10-28: qty 50

## 2021-10-28 MED ORDER — IOHEXOL 300 MG/ML  SOLN
100.0000 mL | Freq: Once | INTRAMUSCULAR | Status: AC | PRN
  Administered 2021-10-28: 100 mL via INTRAVENOUS

## 2021-10-31 ENCOUNTER — Telehealth: Payer: Self-pay | Admitting: Gastroenterology

## 2021-10-31 NOTE — Telephone Encounter (Signed)
Patient returned your phone call.  Please call back.  Thank you. 

## 2021-10-31 NOTE — Telephone Encounter (Signed)
See 6/15 results note

## 2021-11-01 ENCOUNTER — Telehealth: Payer: Self-pay

## 2021-11-01 NOTE — Telephone Encounter (Signed)
LM-11/01/21-Calling pt. To confirm CP visit for 6/20 at 11:45am. Unable to reach pt. [place].  Total time spent: 2 min.

## 2021-11-01 NOTE — Telephone Encounter (Signed)
LM-11/01/21-Pt. Returned call and confirmed CP visit and completed precall questions.  Total time spent: 6 min.

## 2021-11-05 ENCOUNTER — Ambulatory Visit: Payer: Medicare Other | Admitting: Pharmacy Technician

## 2021-11-05 DIAGNOSIS — Z79899 Other long term (current) drug therapy: Secondary | ICD-10-CM

## 2021-11-05 DIAGNOSIS — I10 Essential (primary) hypertension: Secondary | ICD-10-CM

## 2021-11-05 DIAGNOSIS — Z950 Presence of cardiac pacemaker: Secondary | ICD-10-CM

## 2021-11-05 DIAGNOSIS — D6869 Other thrombophilia: Secondary | ICD-10-CM

## 2021-11-06 NOTE — Progress Notes (Signed)
Follow Up Pharmacist Visit   Chelf,[name] A 81 [name], [name] [name]  DOB: 05-Dec-1940  M: [phone]  __________________________________________________ Chronic Conditions Patient's Chronic Conditions: Hypertension (HTN), Atrial Fibrillation, Gastroesophageal Reflux Disease (GERD), Hypothyroidism, Benign Prostatic Hyperplasia (BPH), Hyperlipidemia/Dyslipidemia (HLD), Diabetes (DM), Constipation, Vitamin D deficiency, Prediabetes, ASHD, Tortuous aorta, Senile purpura, Thrombosed external hemorrhoid, OSA & COPD overlap and syndrome, Emphysema of lung, Complex sleep apnea syndrome, Zenker's diverticulum, Dysphagia, Acquired thrombophilia, Thrombocytosis, Morbid obesity, Cardiac pacemaker, Polycythemia vera, Memory changes, Rectal pain, MPN, Intolerance of CPAP, JAK2 V617F mutation, N&V, Loss of appetite, Loss of weight, Generalized abdominal pain  Summary for PCP:  1. PCP to review scan that found incidental adrenal mass. Patient aware of results. 2. Monitor LDL with addition of Zetia. 3. Re-draw TSH to make sure changes are appropriate. 4. Patient needs INR drawn and new facility for coumadin management closer to [facility]. 5. PCP consider CKD diagnosis in chart.  Disease Assessments Current BP: 108/64 Current HR: 73 taken on: 10/16/2021 Weight: 229 BMI: 35.87 Last GFR: 46.49 taken on: 10/10/2021 Visit Completed on: 11/05/2021 Why did the patient present?: CCM Follow up What does the patient do during the day?: Doesn't do much throughout day Who does the patient spend their time with and what do they do?: Wife. Trys to motivate him to be more active but patient doesn't find interest in it. Lifestyle habits such as diet and exercise?: Not much of an appetite and no current exercise Alcohol, tobacco, and illicit drug usage?: None What is the patient's sleep pattern?: No sleep issues How many hours per night does patient typically sleep?: 7+ Factors that may affect medication adherence?:  Pill burden, Altered mental status (dementia, substance use, mental illness) Is Patient using [facility] pharmacy?: No Name and location of Current pharmacy: [facility] Current Rx insurance plan: Other Details: Medicare/AARP Are meds synced by current pharmacy?: No Are meds delivered by current pharmacy?: Yes - by mail order pharmacy Would patient benefit from direct intervention of clinical lead in dispensing process to optimize clinical outcomes?: Yes Are [facility] pharmacy services available where patient lives?: Yes Is patient disadvantaged to use [facility]?: Yes Does patient experience delays in picking up medications due to transportation concerns (getting to pharmacy)?: No Any additional demeanor/mood notes?: Patient presenting via phone for CCM follow up. No chief complaints today. We will be going out of town next week and taking a 2-week road trip to visit family. No issues with medications and he is using a detailed list broken up in before breakfast, after breakfast, before dinner, and bedtime. No copay barriers and utilizes [employer].  SDOH: Accountable Health Communities Health-Related Social Needs Screening Tool (BloggerBowl.es) SDOH questions were documented and reviewed (EMR or [employer]) within the past 3 months?: No What is your living situation today? (ref #1): I have a steady place to live Think about the place you live. Do you have problems with any of the following? (ref #2): None of the above Within the past 12 months, you worried that your food would run out before you got money to buy more (ref #3): Never true Within the past 12 months, the food you bought just didn't last and you didn't have money to get more (ref #4): Never true In the past 12 months, has lack of reliable transportation kept you from medical appointments, meetings, work or from getting things needed for daily living?  (ref #5): No In the past 12 months, has the electric, gas, oil, or water company threatened  to shut off services in your home? (ref #6): No How often does anyone, including family and friends, physically hurt you? (ref #7): Never (1) How often does anyone, including family and friends, insult or talk down to you? (ref #8): Never (1) How often does anyone, including friends and family, threaten you with harm? (ref #9): Never (1) How often does anyone, including family and friends, scream or curse at you? (ref #10): Never (1)  Hypertension (HTN) Discussed with patient today?: Yes Is patient able to obtain BP reading today?: No Goal: <130/80 mmHG Hypertension Stage: Normal (SBP <120 and DBP < 80) Is Patient checking BP at home?: No How often does patient miss taking their blood pressure medications?: None Has patient experienced hypotension, dizziness, falls or bradycardia?: No Check present secondary causes (below) for HTN: Obesity, Sleep Apnea Does Patient use RPM device?: No BP RPM device: Does patient qualify?: No We discussed: DASH diet:  following a diet emphasizing fruits and vegetables and low-fat dairy products along with whole grains, fish, poultry, and nuts. Reducing red meats and sugars., Targeting 150 minutes of aerobic activity per week, Recommend using a salt substitute to replace your salt if you need flavor., Weight reduction- We discussed losing 5-10% of body weight., Proper Home BP Measurement, Hypertension pathophysiology, complications, treatment goals, and management with patient for 10-15 minutes, Increasing movement, Contacting PCP office for signs and symptoms of high or low blood pressure (hypotension, dizziness, falls, headaches, edema) Assessment:: Controlled Drug: Losartan '100mg'$  daily Assessment: Appropriate, Effective, Safe, Accessible Drug: Magnesium Oxide '400mg'$  daily Assessment: Appropriate, Effective, Safe, Accessible Drug: Spironolactone '25mg'$  daily Assessment:  Appropriate, Effective, Safe, Accessible Plan to Start: Home BP three times weekly or when not feeling well Plan to Counsel: Counseled to avoid extra foods containing potassium and limit salt intake  Hyperlipidemia/Dyslipidemia (HLD) Last Lipid panel on: 10/16/2021 TC (Goal<200): 134 LDL: 84 HDL (Goal>40): 29 TG (Goal<150): 113 ASCVD 10-year risk?is:: N/A due to Age > 30 Discussed with patient today?: No Drug: Crestor '40mg'$  daily Assessment: Appropriate, Effective, Safe, Accessible Drug: Zetia '10mg'$  daily Assessment: Appropriate, Effective, Safe, Accessible  Diabetes (DM) Current A1C: 5.2 taken on: 07/04/2021 Type: Pre-Diabetes/Impaired Fasting Glucose Discussed with patient today?: No Drug: None Assessment: Query Appropriateness  GERD Assessment Discussed with patient today?: No  BPH Assessment Current PSA: 0.05 taken on: 07/04/2021 Discussed with patient today?: No Drug: Tamsulosin 0.'4mg'$  daily Assessment: Appropriate, Effective, Safe, Accessible Drug: Finasteride '5mg'$  daily Assessment: Appropriate, Effective, Safe, Accessible  Atrial Fibrillation Current INR: 2.4 taken on: 08/20/2021 Previous INR: 2.1 taken on: 07/09/2021 Discussed with patient today?: Yes CHADS2-VASc: Hypertension (1), Age 44 [name] or Older (2) CHADS2-VASc Score: 3 Type AF: Permanent Type of control: Rate controlled We discussed: Signs and symptoms of bleeding associated with anticoagulant use, Importance of adherence with anticoagulant therapy and regular monitoring, Risks of atrial fibrillation (CVA, MI, heart failure) Assessment:: Uncontrolled Drug: Coumadin '5mg'$  1-2 tabs as directed Assessment: Appropriate, Query Effectiveness Additional Info: Last INR in April was high and has not been checked since. Patient states the clinic he was going to is no longer managing INR/Coumadin and needs a new facility referral. Preferably close to [place] to Order: INR Plan to Counsel: Counseled on  diet and to follow strict rules of eating while on Coumadin  Hypothyroidism Current TSH: 8.07 taken on: 10/16/2021 Current T4: Unknown Current T3: Unknown Discussed with patient today?: Yes Patient has experienced the following symptoms: Fatigue, Hyperlipidemia, Constipation, Bradycardia We discussed: Proper administration of levothyroxine (empty stomach, 30 min  before breakfast and with no other meds / vitamins), Monitoring for signs / symptoms of hypothyroidism (weight gain, fatigue, hair loss, cold intolerance) Assessment:: Uncontrolled Drug: Levothyroxine 259mg 1/2 tab on MWF and 1 tab all other days Assessment: Appropriate, Query Effectiveness Additional Info: Dose recently adjusted and no new labs drawn to see if adjustment was appropriate Plan to Order: TSH Plan to Counsel: Counseled on proper admin of Levothyroxine 30 min to 1 hour before food and other medications and wait 4 hours for supplements  Exercise, Diet and Non-Drug Coordination Needs Additional exercise counseling points. We discussed: aiming to lose 5 to 10% of body weight through lifestyle modifications, incorporating flexibility, balance, and strength training exercises, decreasing sedentary behavior Additional diet counseling points. We discussed: key components of the DASH diet, key components of a low-carb eating plan, aiming to consume at least 8 cups of water day Discussed Non-Drug Care Coordination Needs: Yes Does Patient have Medication financial barriers?: No  CPP Prep: 160m CPP OV: 4177mCPP Doc: 36m66mClinical Summary Patient Risk: Moderate Next CCM Follow Up: Patient to call as needed Next AWV: 03/12/22  [name] [name] Clinical Pharmacist [name] [email or web address] .care ([phone]

## 2021-11-15 DIAGNOSIS — Z950 Presence of cardiac pacemaker: Secondary | ICD-10-CM | POA: Diagnosis not present

## 2021-11-15 DIAGNOSIS — D6869 Other thrombophilia: Secondary | ICD-10-CM | POA: Diagnosis not present

## 2021-11-15 DIAGNOSIS — I10 Essential (primary) hypertension: Secondary | ICD-10-CM | POA: Diagnosis not present

## 2021-12-03 DIAGNOSIS — K602 Anal fissure, unspecified: Secondary | ICD-10-CM | POA: Diagnosis not present

## 2021-12-09 ENCOUNTER — Other Ambulatory Visit: Payer: Self-pay

## 2021-12-09 DIAGNOSIS — C9110 Chronic lymphocytic leukemia of B-cell type not having achieved remission: Secondary | ICD-10-CM

## 2021-12-09 DIAGNOSIS — D45 Polycythemia vera: Secondary | ICD-10-CM

## 2021-12-10 ENCOUNTER — Inpatient Hospital Stay: Payer: Medicare Other | Attending: Hematology

## 2021-12-10 ENCOUNTER — Other Ambulatory Visit: Payer: Self-pay

## 2021-12-10 DIAGNOSIS — D45 Polycythemia vera: Secondary | ICD-10-CM

## 2021-12-10 DIAGNOSIS — Z79899 Other long term (current) drug therapy: Secondary | ICD-10-CM | POA: Insufficient documentation

## 2021-12-10 DIAGNOSIS — D473 Essential (hemorrhagic) thrombocythemia: Secondary | ICD-10-CM | POA: Diagnosis not present

## 2021-12-10 LAB — CMP (CANCER CENTER ONLY)
ALT: 31 U/L (ref 0–44)
AST: 30 U/L (ref 15–41)
Albumin: 4.3 g/dL (ref 3.5–5.0)
Alkaline Phosphatase: 53 U/L (ref 38–126)
Anion gap: 5 (ref 5–15)
BUN: 22 mg/dL (ref 8–23)
CO2: 31 mmol/L (ref 22–32)
Calcium: 9.4 mg/dL (ref 8.9–10.3)
Chloride: 98 mmol/L (ref 98–111)
Creatinine: 1.11 mg/dL (ref 0.61–1.24)
GFR, Estimated: >60 mL/min (ref >60)
Glucose, Bld: 119 mg/dL — ABNORMAL HIGH (ref 70–99)
Potassium: 4.4 mmol/L (ref 3.5–5.1)
Sodium: 134 mmol/L — ABNORMAL LOW (ref 135–145)
Total Bilirubin: 1.4 mg/dL — ABNORMAL HIGH (ref 0.3–1.2)
Total Protein: 6.5 g/dL (ref 6.5–8.1)

## 2021-12-10 LAB — CBC WITH DIFFERENTIAL (CANCER CENTER ONLY)
Abs Immature Granulocytes: 0.15 10*3/uL — ABNORMAL HIGH (ref 0.00–0.07)
Basophils Absolute: 0.1 10*3/uL (ref 0.0–0.1)
Basophils Relative: 1 %
Eosinophils Absolute: 0.1 10*3/uL (ref 0.0–0.5)
Eosinophils Relative: 1 %
HCT: 42.2 % (ref 39.0–52.0)
Hemoglobin: 14.7 g/dL (ref 13.0–17.0)
Immature Granulocytes: 1 %
Lymphocytes Relative: 10 %
Lymphs Abs: 1.5 10*3/uL (ref 0.7–4.0)
MCH: 39.7 pg — ABNORMAL HIGH (ref 26.0–34.0)
MCHC: 34.8 g/dL (ref 30.0–36.0)
MCV: 114.1 fL — ABNORMAL HIGH (ref 80.0–100.0)
Monocytes Absolute: 0.4 10*3/uL (ref 0.1–1.0)
Monocytes Relative: 3 %
Neutro Abs: 12.3 10*3/uL — ABNORMAL HIGH (ref 1.7–7.7)
Neutrophils Relative %: 84 %
Platelet Count: 341 10*3/uL (ref 150–400)
RBC: 3.7 MIL/uL — ABNORMAL LOW (ref 4.22–5.81)
RDW: 13.2 % (ref 11.5–15.5)
WBC Count: 14.5 10*3/uL — ABNORMAL HIGH (ref 4.0–10.5)
nRBC: 0 % (ref 0.0–0.2)

## 2021-12-11 ENCOUNTER — Inpatient Hospital Stay (HOSPITAL_BASED_OUTPATIENT_CLINIC_OR_DEPARTMENT_OTHER): Payer: Medicare Other | Admitting: Hematology

## 2021-12-11 DIAGNOSIS — D45 Polycythemia vera: Secondary | ICD-10-CM | POA: Diagnosis not present

## 2021-12-11 DIAGNOSIS — D473 Essential (hemorrhagic) thrombocythemia: Secondary | ICD-10-CM | POA: Diagnosis not present

## 2021-12-11 DIAGNOSIS — Z79899 Other long term (current) drug therapy: Secondary | ICD-10-CM | POA: Diagnosis not present

## 2021-12-12 ENCOUNTER — Other Ambulatory Visit: Payer: Self-pay | Admitting: Hematology

## 2021-12-13 ENCOUNTER — Telehealth: Payer: Self-pay | Admitting: Hematology

## 2021-12-13 ENCOUNTER — Encounter: Payer: Self-pay | Admitting: Hematology

## 2021-12-13 NOTE — Telephone Encounter (Signed)
Scheduled follow-up appointment per 7/26 los. Patient is aware. 

## 2021-12-17 ENCOUNTER — Other Ambulatory Visit: Payer: Self-pay | Admitting: Internal Medicine

## 2021-12-17 DIAGNOSIS — K219 Gastro-esophageal reflux disease without esophagitis: Secondary | ICD-10-CM

## 2021-12-17 MED ORDER — OMEPRAZOLE 20 MG PO CPDR: DELAYED_RELEASE_CAPSULE | ORAL | 3 refills | Status: DC

## 2021-12-18 ENCOUNTER — Encounter: Payer: Self-pay | Admitting: Hematology

## 2021-12-18 DIAGNOSIS — I255 Ischemic cardiomyopathy: Secondary | ICD-10-CM | POA: Diagnosis not present

## 2021-12-18 NOTE — Progress Notes (Addendum)
HEMATOLOGY/ONCOLOGY PHONE VISIT NOTE  Date of Service: .12/11/2021   Patient Care Team: [name], MD as PCP - General (Internal Medicine) Georg Ruddle, Ashok Cordia, MD as Referring Physician (Cardiology) Shirlee More Ronalee Red, MD as Referring Physician (Cardiology) Brunetta Genera, MD as Consulting Physician (Hematology) Rush Landmark, [facility] as Pharmacist (Pharmacist) Dohmeier, Asencion Partridge, MD as Consulting Physician (Neurology)  CHIEF COMPLAINT On bold that  HISTORY OF PRESENTING ILLNESS:  Please see previous note for details on initial presentation  INTERVAL HISTORY:  .Marland KitchenI connected with [name] Padovano on 12/11/2021 at 10:00 AM EDT by telephone visit and verified that I am speaking with the correct person using two identifiers.   I discussed the limitations, risks, security and privacy concerns of performing an evaluation and management service by telemedicine and the availability of in-person appointments. I also discussed with the patient that there may be a patient responsible charge related to this service. The patient expressed understanding and agreed to proceed.   Other persons participating in the visit and their role in the encounter: None  Patient's location: Home Provider's location: [place]  Chief Complaint: Follow-up for polycythemia vera.  Patient was called to follow-up on his polycythemia vera and review his labs.  He notes no acute new symptoms since his last clinic visit.  Notes some chronic fatigue which is thought to be related to his sleep apnea.  Still having significant cognitive issues and he is hoping that they improve after his sleep apnea treatment is optimized.  Had a good vacation traveling to meet his family in [place] and the [place]. No new leg swelling. No other medication changes. Labs done yesterday were discussed in detail with him.  No indication for therapeutic phlebotomy at this time.   MEDICAL HISTORY:  Past  Medical History:  Diagnosis Date   A-fib ([place]) 01/2014   AICD (automatic cardioverter/defibrillator) present    [employer]   Anal fissure 11/10/2019   Arthritis    ASHD (arteriosclerotic heart disease) 2015   s/p CABG   Asthma    CAD (coronary artery disease)    Cataract    s/p left   CHF (congestive heart failure) (HCC)    COPD (chronic obstructive pulmonary disease) ([place])    by CXR, pt unsure of this   Diverticulosis    GERD (gastroesophageal reflux disease)    Gout 2014   Hyperlipidemia    Hypertension    Hypothyroidism    LBBB (left bundle branch block)    Polycythemia    Pre-diabetes    Prediabetes 01/2014   Presence of permanent cardiac pacemaker    Sleep apnea    no CPAP   Thyroid disease    hypothyroid    SURGICAL HISTORY: Past Surgical History:  Procedure Laterality Date   CARDIAC DEFIBRILLATOR PLACEMENT  07/2014   CATARACT EXTRACTION W/ INTRAOCULAR LENS IMPLANT Left    CORONARY ARTERY BYPASS GRAFT  11/2013   lumb laminectomy  [id number]   x 3   LUMBAR FUSION  2013   NASAL SEPTOPLASTY W/ TURBINOPLASTY Bilateral 01/30/2016   Procedure: NASAL SEPTOPLASTY WITH BILATERAL TURBINATE REDUCTION;  Surgeon: Rozetta Nunnery, MD;  Location: [facility];  Service: ENT;  Laterality: Bilateral;   TONSILLECTOMY     UVULECTOMY N/A 01/30/2016   Procedure: Martyn Ehrich;  Surgeon: Rozetta Nunnery, MD;  Location: [facility];  Service: ENT;  Laterality: N/A;    SOCIAL HISTORY: Social History   Socioeconomic History   Marital status: Married  Spouse name: [place]    Number of children: 2   Years of education: 13   Highest education level: Not on file  Occupational History   Occupation: Retired  Tobacco Use   Smoking status: Former    Packs/day: 2.00    Years: 25.00    Total pack years: 50.00    Types: Cigarettes    Quit date: 05/20/1983    Years since quitting: 38.6   Smokeless tobacco: Never  Vaping Use   Vaping Use: Never used  Substance and Sexual Activity    Alcohol use: Not Currently    Alcohol/week: 0.0 standard drinks of alcohol   Drug use: No   Sexual activity: Not Currently  Other Topics Concern   Not on file  Social History Narrative   Lives with wife, [name]   Caffeine use: daily (Coffee)   Social Determinants of Health   Financial Resource Strain: Not on file  Food Insecurity: Not on file  Transportation Needs: No Transportation Needs (03/29/2021)   PRAPARE - Hydrologist (Medical): No    Lack of Transportation (Non-Medical): No  Physical Activity: Not on file  Stress: Not on file  Social Connections: Not on file  Intimate Partner Violence: Not on file    FAMILY HISTORY: Family History  Problem Relation Age of Onset   Alzheimer's disease Mother    Mental illness Mother    Depression Mother    Heart disease Father 28       had 49 MI's & died 43 yo   Hypertension Father    Stroke Maternal Grandmother    Dementia Maternal Grandmother    Alcohol abuse Son    Heart attack Son    Colon cancer Neg Hx    Stomach cancer Neg Hx    Esophageal cancer Neg Hx     ALLERGIES:  is allergic to other.  MEDICATIONS:  Current Outpatient Medications  Medication Sig Dispense Refill   acetaminophen (TYLENOL) 500 MG tablet Take 500 mg by mouth as needed for mild pain.     allopurinol (ZYLOPRIM) 300 MG tablet TAKE 1 TABLET EVERY DAY TO PREVENT GOUT 90 tablet 3   aspirin EC 81 MG tablet Take 81 mg by mouth daily.     Cholecalciferol (VITAMIN D PO) Take 10,000 Units by mouth daily.     docusate sodium (COLACE) 100 MG capsule Take 100 mg by mouth 2 (two) times daily. 1 tablet in the morning and 1 tablet at bedtime (stool softener)     ezetimibe (ZETIA) 10 MG tablet Take 1 tab daily at night for cholesterol. 90 tablet 3   finasteride (PROSCAR) 5 MG tablet TAKE 1 TABLET EVERY DAY FOR PROSTATE 90 tablet 3   hydrocortisone (ANUSOL-HC) 2.5 % rectal cream Place 1 application. rectally 3 (three) times daily. Use a small  amount per rectum for external hemorrhoid for 10 days 30 g 1   hydroxyurea (HYDREA) 500 MG capsule TAKE 2 CAPSULES ONE TIME DAILY WITH FOOD 180 capsule 0   isosorbide mononitrate (IMDUR) 30 MG 24 hr tablet Take by mouth daily.     levothyroxine (SYNTHROID) 200 MCG tablet TAKE 1 TAB DAILY ON EMPTY STOMACH WITH ONLY WATER FOR 30 MINS. NO ANTACIDS, CALCIUM OR MAGNESIUM FOR 4 HOURS. AVOID BIOTIN. (Patient taking differently: TAKE 1/2 TAB on Mon, Wed, Friday and take 1 whole tablet on all other days ON EMPTY STOMACH WITH ONLY WATER FOR 30 MINS. NO ANTACIDS, CALCIUM OR MAGNESIUM FOR 4 HOURS.  AVOID BIOTIN.) 90 tablet 3   losartan (COZAAR) 100 MG tablet Take 1 tablet (100 mg total) by mouth daily. 90 tablet 3   magnesium oxide (MAG-OX) 400 MG tablet Take by mouth.     MELATONIN PO Take 1 tablet by mouth as needed.     metoprolol (TOPROL-XL) 200 MG 24 hr tablet Take 200 mg by mouth daily.     Multiple Vitamin (MULTIVITAMIN) tablet Take 1 tablet by mouth daily. A-Z     omeprazole (PRILOSEC) 20 MG capsule TAKE 1 CAPSULE TWICE DAILY  ( EVERY 12 HOURS  ) TO PREVENT HEARTBURN AND ACID REFLUX 180 capsule 3   Probiotic Product (PROBIOTIC-10 PO) Take by mouth daily.     rosuvastatin (CRESTOR) 40 MG tablet TAKE 1 TABLET EVERY DAY FOR CHOLESTEROL 90 tablet 3   spironolactone (ALDACTONE) 25 MG tablet TAKE 1 TABLET EVERY DAY FOR BLOOD PRESSURE AND HEART FAILURE 90 tablet 3   tamsulosin (FLOMAX) 0.4 MG CAPS capsule Take 1 tablet at Bedtime for Prostate & Bladder 90 capsule 3   terbinafine (LAMISIL) 250 MG tablet Take 1 tablet Daily for Toenail Fungus 90 tablet 0   torsemide (DEMADEX) 20 MG tablet Take 1 tablet every Morning for Swelling in legs 90 tablet 3   TURMERIC PO Take 1,000 mg by mouth daily.     warfarin (COUMADIN) 5 MG tablet TAKE 1 TO 2 TABLETS EVERY DAY AS DIRECTED 145 tablet 3   No current facility-administered medications for this visit.    REVIEW OF SYSTEMS:   10 Point review of Systems was done is  negative except as noted above.  PHYSICAL EXAMINATION: Telemedicine visit LABORATORY DATA:  I have reviewed the data as listed  .    Latest Ref Rng & Units 12/10/2021    1:40 PM 10/16/2021   12:25 PM 09/04/2021    1:30 PM  CBC  WBC 4.0 - 10.5 K/uL 14.5  13.6  14.0   Hemoglobin 13.0 - 17.0 g/dL 14.7  16.2  14.6   Hematocrit 39.0 - 52.0 % 42.2  44.6  43.2   Platelets 150 - 400 K/uL 341  402  368     .    Latest Ref Rng & Units 12/10/2021    1:40 PM 10/16/2021   12:25 PM 10/10/2021    3:43 PM  CMP  Glucose 70 - 99 mg/dL 119  108  114   BUN 8 - 23 mg/dL 22  31  35   Creatinine 0.61 - 1.24 mg/dL 1.11  1.30  1.42   Sodium 135 - 145 mmol/L 134  135  133   Potassium 3.5 - 5.1 mmol/L 4.4  5.4  4.6   Chloride 98 - 111 mmol/L 98  99  97   CO2 22 - 32 mmol/L '31  27  26   '$ Calcium 8.9 - 10.3 mg/dL 9.4  10.0  9.4   Total Protein 6.5 - 8.1 g/dL 6.5  7.1    Total Bilirubin 0.3 - 1.2 mg/dL 1.4  1.1    Alkaline Phos 38 - 126 U/L 53     AST 15 - 41 U/L 30  25    ALT 0 - 44 U/L 31  25      02/09/18 JAK2 Mutation Study:    02/09/18 BCR ABL FISH:    RADIOGRAPHIC STUDIES: I have personally reviewed the radiological images as listed and agreed with the findings in the report. No results found.  ASSESSMENT & PLAN:   81  y.o. male with  1. 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 Some associated leukocytosis. No history of splenectomy. No previous h/o VTE, but significant cardiac risk factors. 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   02/09/18 BCR ABL FISH revealed no evidence of BCR/ABL rearrangement   PLAN: -Labs done on 12/10/2021 were discussed in detail with the patient CBC shows hemoglobin of 14.7 with  hematocrit of 42.2 WBC count of 14.5k with platelets of 341k Patient reports no symptoms suggestive of infection CMP stable Patient has no current indication for therapeutic phlebotomy at this time No notable toxicity from his current dose of hydroxyurea He will continue his hydroxyurea at 1000 mg p.o. daily Continue aspirin 81 mg p.o. daily Continue to optimize sleep apnea treatment with pulmonary medicine  2. . Patient Active Problem List   Diagnosis Date Noted   Zenker's diverticulum 10/16/2021   Nausea without vomiting 10/10/2021   Loss of appetite 10/10/2021   Loss of weight 10/10/2021   Generalized abdominal pain 10/10/2021   Thrombosed external hemorrhoid 10/10/2021   Dysphagia 08/01/2021   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   Intolerance of continuous positive airway pressure (CPAP) ventilation 05/02/2021   MPN (myeloproliferative neoplasm) ([place]) 08/27/2020   JAK2 V617F mutation 08/27/2020   Rectal pain 11/10/2019   Constipation 11/10/2019   Abnormal glucose 10/12/2019   Memory changes 01/10/2019   Polycythemia vera ([place]) 10/21/2018   Thrombocytosis 12/29/2017   Senile purpura ([place]) 09/01/2017   Emphysema of lung ([place]) 06/01/2017   Tortuous aorta ([place])- CXR 2018 06/01/2017   ASHD/CABG x 3V (11/2013) 02/06/2015   Acquired thrombophilia ([place]) 02/06/2015   Cardiac pacemaker/AICD (01/2014) 02/06/2015   Morbid obesity ([place]) 01/03/2015   HTN (hypertension) 01/02/2015   Hyperlipidemia, mixed 01/02/2015   Vitamin D deficiency 01/02/2015   GERD  01/02/2015   BPH (benign prostatic hyperplasia) 01/02/2015   Medication management 01/02/2015   Atrial fibrillation ([place]) 01/02/2015   Hypothyroidism 01/02/2015   OSA and COPD overlap syndrome ([place]) 01/02/2015  -continue f/u with PCP to optimize atherosclerotic risk factors.    FOLLOW UP: RTC with Dr Irene Limbo with labs in 31month  The total time spent in the appointment was 15 minutes*.  All of the patient's questions were  answered with apparent satisfaction. The patient knows to call the clinic with any problems, questions or concerns.   GSullivan LoneMD MS AAHIVMS SSur[facility] Center [facility]calaCBayhealth Milford Memorial HospitalHematology/Oncology Physician CColumb[facility] Inc .*Total Encounter Time as defined by the Centers for Medicare and Medicaid Services includes, in addition to the face-to-face time of a patient visit (documented in the note above) non-face-to-face time: obtaining and reviewing outside history, ordering and reviewing medications, tests or procedures, care coordination (communications with other health care professionals or caregivers) and documentation in the medical record.

## 2021-12-25 NOTE — Progress Notes (Signed)
Future Appointments  Date Time Provider Department  12/26/2021  3:30 PM [name], MD [facility]  03/12/2022 11:30 AM Darrol Jump, NP [facility]  04/14/2022 12:00 PM [facility]  04/14/2022 12:30 PM Brunetta Genera, MD [facility]  07/10/2022  2:00 PM [name], MD [facility]    History of Present Illness:        This very nice 81 y.o. MWM  with HTN, ASHD/cAFIB/s/p CABG,hx/o AoS,  HLD, Prediabetes,  Gout & GERD and Vitamin D Deficiency.  Patient has OSA/COPD overlap , but is intolerant to CPAP Masks. Patient is on Hydrea for PRV followed by Dr Irene Limbo .           Patient's HTN predates from [place] in 2015 underwent CABG and in 2016 had AICD/Def implanted. Patient has been on Coumadin for cAfib.    Medications  Current Outpatient Medications (Endocrine & Metabolic):    levothyroxine (SYNTHROID) 200 MCG tablet, TAKE 1 TAB DAILY ON EMPTY STOMACH WITH ONLY WATER FOR 30 MINS. NO ANTACIDS, CALCIUM OR MAGNESIUM FOR 4 HOURS. AVOID BIOTIN. (Patient taking differently: TAKE 1/2 TAB on Mon, Wed, Friday and take 1 whole tablet on all other days ON EMPTY STOMACH WITH ONLY WATER FOR 30 MINS. NO ANTACIDS, CALCIUM OR MAGNESIUM FOR 4 HOURS. AVOID BIOTIN.)  Current Outpatient Medications (Cardiovascular):    ezetimibe (ZETIA) 10 MG tablet, Take 1 tab daily at night for cholesterol.   isosorbide mononitrate (IMDUR) 30 MG 24 hr tablet, Take by mouth daily.   losartan (COZAAR) 100 MG tablet, Take 1 tablet (100 mg total) by mouth daily.   metoprolol (TOPROL-XL) 200 MG 24 hr tablet, Take 200 mg by mouth daily.   rosuvastatin (CRESTOR) 40 MG tablet, TAKE 1 TABLET EVERY DAY FOR CHOLESTEROL   spironolactone (ALDACTONE) 25 MG tablet, TAKE 1 TABLET EVERY DAY FOR BLOOD PRESSURE AND HEART FAILURE   torsemide (DEMADEX) 20 MG tablet, Take 1 tablet every Morning for Swelling in legs   Current Outpatient Medications (Analgesics):    acetaminophen (TYLENOL) 500 MG tablet, Take  500 mg by mouth as needed for mild pain.   allopurinol (ZYLOPRIM) 300 MG tablet, TAKE 1 TABLET EVERY DAY TO PREVENT GOUT   aspirin EC 81 MG tablet, Take 81 mg by mouth daily.  Current Outpatient Medications (Hematological):    Cyanocobalamin (B-12) 1000 MCG CAPS, Take by mouth.   warfarin (COUMADIN) 5 MG tablet, TAKE 1 TO 2 TABLETS EVERY DAY AS DIRECTED  Current Outpatient Medications (Other):    Biotin 10000 MCG TABS, Take by mouth.   Cholecalciferol (VITAMIN D PO), Take 10,000 Units by mouth daily.   Docosahexaenoic Acid (DHA OMEGA 3 PO), Take by mouth. 1000 mg   docusate sodium (COLACE) 100 MG capsule, Take 100 mg by mouth 2 (two) times daily. 1 tablet in the morning and 1 tablet at bedtime (stool softener)   finasteride (PROSCAR) 5 MG tablet, TAKE 1 TABLET EVERY DAY FOR PROSTATE   hydrocortisone (ANUSOL-HC) 2.5 % rectal cream, Place 1 application. rectally 3 (three) times daily. Use a small amount per rectum for external hemorrhoid for 10 days   hydroxyurea (HYDREA) 500 MG capsule, TAKE 2 CAPSULES ONE TIME DAILY WITH FOOD   magnesium oxide (MAG-OX) 400 MG tablet, Take by mouth.   MELATONIN PO, Take 1 tablet by mouth as needed.   Multiple Vitamin (MULTIVITAMIN) tablet, Take 1 tablet by mouth daily. A-Z   omeprazole (PRILOSEC) 20 MG capsule, TAKE 1 CAPSULE TWICE DAILY  ( EVERY 12  HOURS  ) TO PREVENT HEARTBURN AND ACID REFLUX   Probiotic Product (PROBIOTIC-10 PO), Take by mouth daily.   tamsulosin (FLOMAX) 0.4 MG CAPS capsule, Take 1 tablet at Bedtime for Prostate & Bladder   TURMERIC PO, Take 1,000 mg by mouth daily.   Zinc 50 MG TABS, Take by mouth.   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   Observations/Objective:  BP 110/72   Pulse 70   Temp 97.7 F (36.5 C)   Resp 16   Ht '5\' 7"'$  (1.702 m)   Wt 229 lb 12.8 oz (104.2 kg)   SpO2 96%   BMI 35.99 kg/m   HEENT - WNL. Neck - supple.  Chest - Clear equal BS. Cor - Nl HS. RRR w/o sig MGR. PP 1(+). No edema. MS- FROM w/o deformities.  Gait Nl. Neuro -  Nl w/o focal abnormalities. Has difficulty with short term memory recall.   Assessment and Plan:   1. Essential hypertension  - CBC with Differential/Platelet  2. Hyperlipidemia, mixed   3. Paroxysmal atrial fibrillation (HCC)  - Protime-INR  4. Hypothyroidism, unspecified type  - CBC with Differential/Platelet  5. Monitoring for anticoagulant use  - Protime-INR  - Discussed switch to a NOAC as Eliquis or  Xarelto. He is to contact his insurance to discover co-pays.    6. Medication management  - CBC with Differential/Platelet - Protime-INR    Follow Up Instructions:        I discussed the assessment and treatment plan with the patient. The patient was provided an opportunity to ask questions and all were answered. The patient agreed with the plan and demonstrated an understanding of the instructions.       The patient was advised to call back or seek an in-person evaluation if the symptoms worsen or if the condition  fails to improve as anticipated.    Kirtland Bouchard, MD

## 2021-12-26 ENCOUNTER — Ambulatory Visit (INDEPENDENT_AMBULATORY_CARE_PROVIDER_SITE_OTHER): Payer: Medicare Other | Admitting: Internal Medicine

## 2021-12-26 ENCOUNTER — Encounter: Payer: Self-pay | Admitting: Internal Medicine

## 2021-12-26 VITALS — BP 110/72 | HR 70 | Temp 97.7°F | Resp 16 | Ht 67.0 in | Wt 229.8 lb

## 2021-12-26 DIAGNOSIS — E782 Mixed hyperlipidemia: Secondary | ICD-10-CM

## 2021-12-26 DIAGNOSIS — Z7901 Long term (current) use of anticoagulants: Secondary | ICD-10-CM | POA: Diagnosis not present

## 2021-12-26 DIAGNOSIS — Z79899 Other long term (current) drug therapy: Secondary | ICD-10-CM

## 2021-12-26 DIAGNOSIS — I10 Essential (primary) hypertension: Secondary | ICD-10-CM

## 2021-12-26 DIAGNOSIS — I480 Paroxysmal atrial fibrillation: Secondary | ICD-10-CM

## 2021-12-26 DIAGNOSIS — E039 Hypothyroidism, unspecified: Secondary | ICD-10-CM | POA: Diagnosis not present

## 2021-12-26 DIAGNOSIS — Z5181 Encounter for therapeutic drug level monitoring: Secondary | ICD-10-CM | POA: Diagnosis not present

## 2021-12-26 NOTE — Patient Instructions (Signed)
Warfarin Information Warfarin is a blood thinner (anticoagulant). Anticoagulants help to prevent the formation of blood clots or keep them from getting bigger. Your health care provider will monitor the anticoagulation effect of warfarin closely and will adjust your medicine as needed. Who should use warfarin? Warfarin is prescribed for people who have blood clots, or who are at risk for developing harmful blood clots, such as people who: Have mechanical heart valves. Have irregular heart rhythms (atrial fibrillation). Have certain clotting disorders. Have had blood clots in the past or are currently receiving treatment for them. This includes people who have had a stroke, blood clots in the lungs (pulmonary embolism, or PE), or blood clots in the legs (deep vein thrombosis,or DVT). How is warfarin taken? Warfarin is taken by mouth (orally). Warfarin tablets come in different strengths. The strength is printed on the tablet, and each strength is a different color. If you get a new prescription and the color of your tablet is different than usual, tell your pharmacist or health care provider immediately. Take warfarin exactly as told by your health care provider, at the same time every day. Doing this helps you avoid bleeding or blood clots that could result in serious injury, pain, or disability. Contact your health care provider if a dose is forgotten or missed. Do not change or take additional dosesto make up for missed or accidental extra doses. What blood tests do I need while taking warfarin?  Warfarin is a medicine that needs to be closely monitored with blood tests. It is very important to keep all lab visits and follow-up visits with your health care provider. These tests measure the blood's ability to clot and are called prothrombin tests (PT)or international normalized ratio (INR) tests. These tests can be done with a finger stick or a blood draw. What does the INR test result mean? The PT  test results will be reported as the INR. Your health care provider will tell you your target INR range. If your INR is not in your target range, your health care provider may adjust your dosage. If your INR is above your target range, there is a risk of bleeding. Your dosage of warfarin may need to be decreased. If your INR is below your target range, there is a risk of clotting. Your dosage of warfarin may need to be increased. How often is the INR test needed? When you first start warfarin, you will usually have your INR checked every few days until the health care provider determines the correct dosage of warfarin. After you have reached your target INR, your INR will be tested less often. However, you will need to have your INR checked at least once every 4-6 weeks while you take warfarin. Some people may be able to use home monitoring to check their INR. Ask your health care provider if this applies to you. What are the side effects of warfarin? Too much warfarin can cause bleeding or hemorrhage in any part of the body, such as: Bleeding from the gums. Unexplained bruises or bruises that get larger. A nosebleed that is not easily stopped. Bleeding in the brain (hemorrhagic stroke). Coughing up or vomiting blood. Blood in the urine or stools. Warfarin may also cause: Skin rash or irritations. Nausea that does not go away. Severe pain in the back or joints. Painful toes that turn blue or purple (purple toe syndrome). Painful ulcers that do not go away (skin necrosis). What precautions do I need to take while using warfarin?   Wear a medical alert bracelet or carry a card that lists what medicines you take. Make sure that all health care providers, including your dentist, know you are taking warfarin. Avoid situations that cause bleeding by: Using a softer toothbrush. Flossing with waxed floss. Shaving with an electric razor, not with a blade. Limiting your use of sharp  objects. Avoiding activities that put you at risk for injury, such as contact sports. What do I need to know about warfarin and pregnancy or breastfeeding? If you are taking warfarin and you become pregnant, or plan to become pregnant, contact your health care provider right away. Though warfarin has been associated with birth defects, it can be used in some cases after weighing risks to mother and baby. If you plan to breastfeed while taking warfarin, talk with your health care provider first. What do I need to know about warfarin and alcohol or drug use? Do not drink alcohol if: Your health care provider tells you not to drink. You are pregnant, may be pregnant, or are planning to become pregnant. If you drink alcohol: Limit how much you have to: 0-1 drink a day for women. 0-2 drinks a day for men. Know how much alcohol is in your drink. In the [place] one drink equals one 12 oz bottle of beer (355 mL), one 5 oz glass of wine (148 mL), or one 1 oz glass of hard liquor (44 mL). If you change the amount of alcohol that you drink, tell your health care provider. Your warfarin dosage may need to be changed. Do not use any products that contain nicotine or tobacco. These products include cigarettes, chewing tobacco, and vaping devices, such as e-cigarettes. If you need help quitting, ask your health care provider. If you use nicotine or tobacco products and change the amount that you use, tell your health care provider. Your warfarin dosage may need to be changed. Avoid drug use while taking warfarin. The effects of drugs on warfarin are not known. What do I need to know about warfarin and other medicines or supplements? Many prescription and over-the-counter medicines can interfere with warfarin. Talk with your health care provider or your pharmacist before starting or stopping any new medicines. This includes vitamins, herbs, supplements, and pain medicines. Some common over-the-counter medicines  that may increase the risk of dangerous bleeding while taking warfarin include: Aspirin. NSAIDs, such as ibuprofen or naproxen. Vitamin E. Fish oils. What do I need to know about warfarin and my diet? Vitamin K decreases the effect of warfarin, and it is found in many foods. Eat a consistent amount of foods that contain vitamin K. For example, you may decide to eat 2 servings of vitamin K-containing foods each day. It is important to maintain a normal, balanced diet while taking warfarin. Avoid major changes in your diet. If you are going to change your diet, talk with your health care provider before making changes. Your health care provider may recommend that you work with a dietitian. Contact a health care provider if you: Miss a dose. Take an extra dose. Plan to have any kind of surgery or procedure. Ask whether you should stop taking warfarin or change your dose before your surgery. Are unable to take your medicine due to nausea, vomiting, or diarrhea. Have any major changes in your diet, or you plan to make major changes in your diet. Start or stop any over-the-counter medicine, prescription medicine, herbal supplement, or dietary supplement. Become pregnant, plan to become pregnant, or think you   may be pregnant. Have menstrual periods that are heavier than usual. Have unusual bruising. Get help right away if you: Have signs of an allergic reaction, such as: Swelling of the lips, face, tongue, mouth, or throat. Rash or itchy, red, swollen areas of skin (hives). Trouble breathing. Chest tightness. Fall or have an accident, especially if you hit your head. Have signs that your blood is too thin, such as: Blood in your urine. Your urine may look reddish, pinkish, or tea-colored. Blood in your stool. Your stool may be black or bright red. Coughing up or vomiting blood. The blood may be bright red, or it may look like coffee grounds. Bleeding that does not stop after applying pressure  to the area for 30 minutes. Have signs of a blood clot in your leg or arm, such as: Pain or swelling in your leg or arm. Skin that is red or warm to the touch on your arm or leg. Have signs of blood in your lung, such as: Shortness of breath or difficulty breathing. Chest pain. Unexplained fever. Have any symptoms of a stroke. "BE FAST" is an easy way to remember the main warning signs of a stroke: B - Balance. Signs are dizziness, sudden trouble walking, or loss of balance. E - Eyes. Signs are trouble seeing or a sudden change in vision. F - Face. Signs are sudden weakness or numbness of the face, or the face or eyelid drooping on one side. A - Arms. Signs are weakness or numbness in an arm. This happens suddenly and usually on one side of the body. S - Speech. Signs are sudden trouble speaking, slurred speech, or trouble understanding what people say. T - Time. Time to call emergency services. Write down what time symptoms started. Have other signs of a stroke, such as: A sudden, severe headache with no known cause. Nausea or vomiting. Seizure. Have other signs of a reaction to warfarin, such as: Purple or blue toes. Skin ulcers that do not go away. These symptoms may represent a serious problem that is an emergency. Do not wait to see if the symptoms will go away. Get medical help right away. Call your local emergency services (911 in the [place] Do not drive yourself to the hospital. Summary Warfarin is a medicine that thins blood. It is used to prevent or treat blood clots. You must be monitored closely while on this medicine. Keep all follow-up visits. Make sure that you know your target INR range and your warfarin dosage. Wear or carry identification that says you are taking warfarin. Take warfarin at the same time every day. Call your health care provider if you miss a dose or if you take an extra dose. Do not change the dosage of warfarin on your own. Know the signs and symptoms  of blood clots, bleeding, and a stroke. Know when to get emergency medical help. This information is not intended to replace advice given to you by your health care provider. Make sure you discuss any questions you have with your health care provider. Document Revised: 07/25/2020 Document Reviewed: 07/25/2020 Elsevier Patient Education  2023 [employer]  

## 2021-12-27 LAB — CBC WITH DIFFERENTIAL/PLATELET
Absolute Monocytes: 585 cells/uL (ref 200–950)
Basophils Absolute: 100 cells/uL (ref 0–200)
Basophils Relative: 0.6 %
Eosinophils Absolute: 84 cells/uL (ref 15–500)
Eosinophils Relative: 0.5 %
HCT: 39.4 % (ref 38.5–50.0)
Hemoglobin: 14.3 g/dL (ref 13.2–17.1)
Lymphs Abs: 1670 cells/uL (ref 850–3900)
MCH: 39.5 pg — ABNORMAL HIGH (ref 27.0–33.0)
MCHC: 36.3 g/dL — ABNORMAL HIGH (ref 32.0–36.0)
MCV: 108.8 fL — ABNORMAL HIGH (ref 80.0–100.0)
MPV: 10 fL (ref 7.5–12.5)
Monocytes Relative: 3.5 %
Neutro Abs: 14262 cells/uL — ABNORMAL HIGH (ref 1500–7800)
Neutrophils Relative %: 85.4 %
Platelets: 411 10*3/uL — ABNORMAL HIGH (ref 140–400)
RBC: 3.62 10*6/uL — ABNORMAL LOW (ref 4.20–5.80)
RDW: 12.5 % (ref 11.0–15.0)
Total Lymphocyte: 10 %
WBC: 16.7 10*3/uL — ABNORMAL HIGH (ref 3.8–10.8)

## 2021-12-27 LAB — PROTIME-INR
INR: 2.2 — ABNORMAL HIGH
Prothrombin Time: 21.8 s — ABNORMAL HIGH (ref 9.0–11.5)

## 2021-12-27 NOTE — Progress Notes (Signed)
<><><><><><><><><><><><><><><><><><><><><><><><><><><><><><><><><> <><><><><><><><><><><><><><><><><><><><><><><><><><><><><><><><><> -   Test results slightly outside the reference range are not unusual. If there is anything important, I will review this with you,  otherwise it is considered normal test values.  If you have further questions,  please do not hesitate to contact me at the office or via My Chart.  <><><><><><><><><><><><><><><><><><><><><><><><><><><><><><><><><> <><><><><><><><><><><><><><><><><><><><><><><><><><><><><><><><><>  -  CBC - Stable - WBC elevated as has been   - Protime  = 2.2 x   - Continue Coumadin same til decide about whether to take  Xarelto or Eliquis  <><><><><><><><><><><><><><><><><><><><><><><><><><><><><><><><><> <><><><><><><><><><><><><><><><><><><><><><><><><><><><><><><><><>

## 2021-12-31 NOTE — Progress Notes (Signed)
PATIENT IS AWARE OF LAB RESULTS AND INSTRUCTIONS. -E WELCH

## 2022-01-03 DIAGNOSIS — I255 Ischemic cardiomyopathy: Secondary | ICD-10-CM | POA: Diagnosis not present

## 2022-01-03 DIAGNOSIS — Z95810 Presence of automatic (implantable) cardiac defibrillator: Secondary | ICD-10-CM | POA: Diagnosis not present

## 2022-01-03 DIAGNOSIS — I480 Paroxysmal atrial fibrillation: Secondary | ICD-10-CM | POA: Diagnosis not present

## 2022-01-22 NOTE — Progress Notes (Deleted)
3 MONTH FOLLOW UP Assessment:     Essential hypertension At goal; continue medications Monitor blood pressure at home; call if consistently over 130/80 Continue DASH diet.   Reminder to go to the ER if any CP, SOB, nausea, dizziness, severe HA, changes vision/speech, left arm numbness and tingling and jaw pain.  Paroxysmal atrial fibrillation ([name] [name]) Continue follow up with a fib clinic for coumadin management Discussed if patient falls to immediately contact office or go to ER. Discussed foods that can increase or decrease Coumadin levels. Patient understands to call the office before starting a new medication.  Acquired thrombophilia ([name] [name]) A. Fib and hematological conditions; treated by coumadin - followed by a. Fib clinic.  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 Followed by cardiology    Gastroesophageal reflux disease, esophagitis presence not specified Well controlled sx at this time off of meds Discussed diet, avoiding triggers and other lifestyle changes  Hypothyroidism, unspecified type continue medications the same pending labs reminded to take on an empty stomach 30-11mns before food, 4+ hours apart from reflux meds -     TSH  Mixed hyperlipidemia At goal for LDL; continue statin- discussed diet for borderline trigs Continue low cholesterol diet and exercise.  -     Lipid panel  Abnormal glucose Discussed disease and risks of elevated glucose Discussed diet/exercise, weight management  -     Hemoglobin A1c - defer, last at goal   Vitamin D deficiency At goal; continue supplementation Defer vitamin D level  Medication management -     Continued        Over 30 minutes of exam, counseling, chart review, and critical decision making was performed  Future Appointments  Date Time Provider [place] 01/23/2022 11:00 AM [name] Rossetti NP [facility] None  03/12/2022 11:30 AM [name] Jump NP [facility] None  04/14/2022 12:00 PM [name] [name] LAB [name] [name] None  04/14/2022 12:30 PM [name] Genera MD [name] [name] None  04/15/2022  2:30 PM [name] Pinto MD [facility] None  07/10/2022  2:00 PM [name] Pinto MD [facility] None    Subjective:  [name] [name] is a 81y.o. male who presents for 3 month follow up for HTN, hyperlipidemia, hx of prediabetes, morbid obesity and vitamin D Def.    In 2015, he underwent a CABG x 3 V w/po Afib- and failed 2 attempts at VCV. Then in 07/2014, he had a BiVent, AICD/Defib/PM planted. He is followed at his Cardiologist (Dr. DGeorg Ruddle coumadin clinic.  Patient denies any cardiac symptoms as chest pain, palpitations, shortness of breath, dizziness or ankle swelling. He has dx of sleep apnea but did not tolerate CPAP at that time.   He demonstrated slowing of memory, having more difficulty recalling names, doing well managing meds with pill boxes.   Recently established with Dr. DBrett Fairyand recommended BiPAP but patient states still hasn't received the machine 2 months later -   he has a diagnosis of GERD which is currently managed by lifestyle, denies breakthrough sx.   He has history of hemorrhoids, worse with constipation. Last colonoscopy 2008, cologuard neg 2019. He takes colace BID to help prevent constipation. Was having persistent sx recently, was evaluated by GI 09/2021, prescribed steroid cream and referred to gen surgery for hemorrhoid, so far hasn't noted improvement with topical. Also CT abd/pelvis pending due to unexplained weight loss -   BMI is There is no height or weight on file to calculate BMI., he admits hasn't  been intentionally exercising, but does some house projects, works in yard trimming trees. He does occasionally use treadmill, step machine, rower at home. Has noted less of an appetite in recent years and eating smaller portions.  Wt Readings from Last 3 Encounters:  12/26/21 229 lb 12.8 oz (104.2 kg)   10/16/21 229 lb (103.9 kg)  10/10/21 229 lb 12.8 oz (104.2 kg)   His blood pressure has been controlled at home, today their BP is   He does workout. He denies chest pain, shortness of breath, dizziness.   He is on cholesterol medication (atorvastatin 40 mg daily) and denies myalgias. His LDL cholesterol is at goal. The cholesterol last visit was:   Lab Results  Component Value Date   CHOL 134 10/16/2021   HDL 29 (L) 10/16/2021   LDLCALC 84 10/16/2021   TRIG 113 10/16/2021   CHOLHDL 4.6 10/16/2021   He has been working on diet and exercise for hx of prediabetes, and denies increased appetite, nausea, paresthesia of the feet, polydipsia, polyuria, visual disturbances, vomiting and weight loss. Last A1C in the office was:  Lab Results  Component Value Date   HGBA1C 5.2 07/04/2021   He is on thyroid medication. His medication was not changed last visit. He is taking levothyroxine 200 mcg daily, has been taking 30 min prior to other food or meds since last vist.  Lab Results  Component Value Date   TSH 8.07 (H) 10/16/2021   Last GFR Lab Results  Component Value Date   EGFR 55 (L) 10/16/2021   Patient is on Vitamin D supplement and at goal at recent check:    Lab Results  Component Value Date   VD25OH 85 07/04/2021     Patient is on allopurinol for gout and does not report a recent flare.  Lab Results  Component Value Date   LABURIC 4.5 08/13/2021   Patient has Polycythemia Vera, myeloproliferative neoplasm, followed by Dr.  Irene Limbo at [name] and treated by hydroxyurea and periodic therapeutic phlebotomies.     Latest Ref Rng & Units 12/26/2021    3:34 PM 12/10/2021    1:40 PM 10/16/2021   12:25 PM  CBC  WBC 3.8 - 10.8 Thousand/uL 16.7  14.5  13.6   Hemoglobin 13.2 - 17.1 g/dL 14.3  14.7  16.2   Hematocrit 38.5 - 50.0 % 39.4  42.2  44.6   Platelets 140 - 400 Thousand/uL 411  341  402      Medication Review:  Current Outpatient Medications (Endocrine  & Metabolic):    levothyroxine (SYNTHROID) 200 MCG tablet, TAKE 1 TAB DAILY ON EMPTY STOMACH WITH ONLY WATER FOR 30 MINS. NO ANTACIDS, CALCIUM OR MAGNESIUM FOR 4 HOURS. AVOID BIOTIN. (Patient taking differently: TAKE 1/2 TAB on Mon, Wed, Friday and take 1 whole tablet on all other days ON EMPTY STOMACH WITH ONLY WATER FOR 30 MINS. NO ANTACIDS, CALCIUM OR MAGNESIUM FOR 4 HOURS. AVOID BIOTIN.)  Current Outpatient Medications (Cardiovascular):    ezetimibe (ZETIA) 10 MG tablet, Take 1 tab daily at night for cholesterol.   isosorbide mononitrate (IMDUR) 30 MG 24 hr tablet, Take by mouth daily.   losartan (COZAAR) 100 MG tablet, Take 1 tablet (100 mg total) by mouth daily.   metoprolol (TOPROL-XL) 200 MG 24 hr tablet, Take 200 mg by mouth daily.   rosuvastatin (CRESTOR) 40 MG tablet, TAKE 1 TABLET EVERY DAY FOR CHOLESTEROL   spironolactone (ALDACTONE) 25 MG tablet, TAKE 1 TABLET EVERY DAY FOR BLOOD  PRESSURE AND HEART FAILURE   torsemide (DEMADEX) 20 MG tablet, Take 1 tablet every Morning for Swelling in legs   Current Outpatient Medications (Analgesics):    acetaminophen (TYLENOL) 500 MG tablet, Take 500 mg by mouth as needed for mild pain.   allopurinol (ZYLOPRIM) 300 MG tablet, TAKE 1 TABLET EVERY DAY TO PREVENT GOUT   aspirin EC 81 MG tablet, Take 81 mg by mouth daily.  Current Outpatient Medications (Hematological):    Cyanocobalamin (B-12) 1000 MCG CAPS, Take by mouth.   warfarin (COUMADIN) 5 MG tablet, TAKE 1 TO 2 TABLETS EVERY DAY AS DIRECTED  Current Outpatient Medications (Other):    Biotin 10000 MCG TABS, Take by mouth.   Cholecalciferol (VITAMIN D PO), Take 10,000 Units by mouth daily.   Docosahexaenoic Acid (DHA OMEGA 3 PO), Take by mouth. 1000 mg   docusate sodium (COLACE) 100 MG capsule, Take 100 mg by mouth 2 (two) times daily. 1 tablet in the morning and 1 tablet at bedtime (stool softener)   finasteride (PROSCAR) 5 MG tablet, TAKE 1 TABLET EVERY DAY FOR PROSTATE    hydrocortisone (ANUSOL-HC) 2.5 % rectal cream, Place 1 application. rectally 3 (three) times daily. Use a small amount per rectum for external hemorrhoid for 10 days   hydroxyurea (HYDREA) 500 MG capsule, TAKE 2 CAPSULES ONE TIME DAILY WITH FOOD   magnesium oxide (MAG-OX) 400 MG tablet, Take by mouth.   MELATONIN PO, Take 1 tablet by mouth as needed.   Multiple Vitamin (MULTIVITAMIN) tablet, Take 1 tablet by mouth daily. A-Z   omeprazole (PRILOSEC) 20 MG capsule, TAKE 1 CAPSULE TWICE DAILY  ( EVERY 12 HOURS  ) TO PREVENT HEARTBURN AND ACID REFLUX   Probiotic Product (PROBIOTIC-10 PO), Take by mouth daily.   tamsulosin (FLOMAX) 0.4 MG CAPS capsule, Take 1 tablet at Bedtime for Prostate & Bladder   terbinafine (LAMISIL) 250 MG tablet, Take 1 tablet Daily for Toenail Fungus (Patient not taking: Reported on 12/26/2021)   TURMERIC PO, Take 1,000 mg by mouth daily.   Zinc 50 MG TABS, Take by mouth.  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  Surgical: He  has a past surgical history that includes  Coronary artery bypass graft (11/2013); lumb laminectomy (5409,8119,1478); Lumbar fusion (2013); Cardiac defibrillator placement (07/2014); Cataract extraction w/ intraocular lens implant (Left); Nasal septoplasty w/ turbinoplasty (Bilateral, 01/30/2016); Uvulectomy (N/A, 01/30/2016); and Tonsillectomy. Family His family history includes Alcohol abuse in his son; Alzheimer's disease in his mother; Dementia in his maternal grandmother; Depression in his mother; Heart attack in his son; Heart disease (age of onset: 79) in his father; Hypertension in his father; Mental illness in his mother; Stroke in his maternal grandmother. Social history  He reports that he quit smoking about 38 years ago. His smoking use included cigarettes. He has a  50.00 pack-year smoking history. He has never used smokeless tobacco. He reports that he does not currently use alcohol. He reports that he does not use drugs.   Review of Systems  Constitutional:  Negative for malaise/fatigue and weight loss.  HENT:  Negative for hearing loss and tinnitus.   Eyes:  Negative for blurred vision and double vision.  Respiratory:  Negative for cough, shortness of breath and wheezing.   Cardiovascular:  Negative for chest pain, palpitations, orthopnea, claudication and leg swelling.  Gastrointestinal:  Positive for nausea (mild in AM, GI working up). Negative for abdominal pain, blood in stool, constipation, diarrhea, heartburn, melena and vomiting.  Genitourinary: Negative.   Musculoskeletal:  Negative for joint pain and myalgias.  Skin:  Negative for rash.  Neurological:  Negative for dizziness, tingling, sensory change, weakness and headaches.  Endo/Heme/Allergies:  Negative for polydipsia.  Psychiatric/Behavioral:  Positive for memory loss (stable, poor short term recall). Negative for depression and substance abuse. The patient is not nervous/anxious.   All other systems reviewed and are negative.    Objective:   There were no  vitals filed for this visit.    There is no height or weight on file to calculate BMI.  General appearance: alert, obese, no distress, WD/WN, male HEENT: normocephalic, sclerae anicteric, TMs pearly, nares patent, no discharge or erythema, pharynx normal Oral cavity: MMM, no lesions Neck: supple, no lymphadenopathy, no thyromegaly, no masses Heart: RRR, normal S1, S2, mild 2/6 systolic murmur Lungs: Present throughout, symmetrical, No rhonchi, or rales or wheezing Abdomen: +bs, soft, non tender, non distended, no masses, no hepatomegaly, no splenomegaly Musculoskeletal: nontender, no swelling, no obvious deformity Extremities: no edema, no cyanosis, no clubbing Pulses: 2+ symmetric, upper and lower extremities, normal cap refill Neurological: alert, oriented x 3, CN2-12 intact, strength normal upper extremities and lower extremities, sensation normal throughout, DTRs 2+ throughout, no cerebellar signs, gait normal. Some slow recall.  Psychiatric: normal affect, behavior normal, pleasant     Alycia Rossetti, NP   01/22/2022

## 2022-01-23 ENCOUNTER — Ambulatory Visit: Payer: Medicare Other | Admitting: Nurse Practitioner

## 2022-01-23 DIAGNOSIS — I10 Essential (primary) hypertension: Secondary | ICD-10-CM

## 2022-01-23 DIAGNOSIS — D6869 Other thrombophilia: Secondary | ICD-10-CM

## 2022-01-23 DIAGNOSIS — Z79899 Other long term (current) drug therapy: Secondary | ICD-10-CM

## 2022-01-23 DIAGNOSIS — I480 Paroxysmal atrial fibrillation: Secondary | ICD-10-CM

## 2022-01-23 DIAGNOSIS — E039 Hypothyroidism, unspecified: Secondary | ICD-10-CM

## 2022-01-23 DIAGNOSIS — E782 Mixed hyperlipidemia: Secondary | ICD-10-CM

## 2022-01-30 ENCOUNTER — Other Ambulatory Visit: Payer: Self-pay

## 2022-01-30 DIAGNOSIS — D45 Polycythemia vera: Secondary | ICD-10-CM

## 2022-01-30 MED ORDER — HYDROXYUREA 500 MG PO CAPS: ORAL_CAPSULE | ORAL | 0 refills | Status: DC

## 2022-02-23 ENCOUNTER — Telehealth: Payer: Self-pay | Admitting: Neurology

## 2022-02-23 NOTE — Telephone Encounter (Signed)
Dear Dr. Melford Aase. Mr [name] [name] has not picked up his BiPAP ST machine that was ordered in April 2023-  Can you update [place] as to his status?  He had left the sleep lab before fully completing the titration, he may have decided to forgo treatment.  Larey Seat, MD

## 2022-03-11 ENCOUNTER — Ambulatory Visit: Payer: Medicare Other | Admitting: Adult Health

## 2022-03-11 NOTE — Progress Notes (Deleted)
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   Senile purpura (HCC) Discussed process, protect skin, sunscreen  OSA and COPD overlap syndrome (HCC) Didn't tolerate CPAP, symptoms improved with sinus surgery, weight loss, sleeping in recliner Dr. Brett Fairy now following weight loss still advised.  Rare albuterol  Other hemorrhoids Continue bowel regimen; recently without flares  Essential hypertension At goal; continue medications Monitor blood pressure at home; call if consistently over 130/80 Continue DASH diet.   Reminder to go to the ER if any CP, SOB, nausea, dizziness, severe HA, changes vision/speech, left arm numbness and tingling and jaw pain.  Paroxysmal atrial fibrillation ([place]) Continue follow up with a fib clinic for coumadin management  ***  Discussed if patient falls to immediately contact office or go to ER. Discussed foods that can increase or decrease Coumadin levels. Patient understands to call the office before starting a new medication.  Acquired thrombophilia ([place]) A. Fib and hematological conditions; treated by coumadin - followed by a. Fib clinic.   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 Followed by cardiology    Gastroesophageal reflux disease, esophagitis presence not specified Well managed on current medications Discussed diet, avoiding triggers and other lifestyle changes  Hypothyroidism, unspecified type continue medications the same pending labs reminded to take on an empty stomach 30-67mns before food, 4+ hours apart from reflux meds -     TSH  Benign prostatic hyperplasia with lower urinary tract symptoms, symptom details unspecified Continue medications; symptoms well controlled Check  annual PSA; urology referral as indicated  Mixed hyperlipidemia At goal for LDL; continue statin- discussed diet for borderline trigs Continue low cholesterol diet and exercise.  -     Lipid panel  Abnormal glucose Discussed disease and risks of elevated glucose Discussed diet/exercise, weight management  -     Hemoglobin A1c - defer, last at goal   Vitamin D deficiency At goal; continue supplementation Defer vitamin D level  Medication management -     CBC with Differential/Platelet -     CMP/GFR -     Magnesium  Morbid obesity ([place]- BMI 38 Long discussion about weight loss, diet, and exercise Recommended diet heavy in fruits and veggies and low in animal meats, cheeses, and dairy products, appropriate calorie intake Discussed appropriate weight for height, goal of <250lb set, increase regular exercise Follow up in 3 months  Cardiac pacemaker/AICD (01/2014) Continue follow up with cardiology  Polycythemia Vera (HCC)/ MPN (HCC)/ Thrombocytosis Followed by Dr. KIrene Limbo on hydroxyurea 500 mg  Monitor CBC  Need for influenza vaccine High dose quadrivalent administered without complication today    Over 30 minutes of exam, counseling, chart review, and critical decision making was performed  Future Appointments  Date Time Provider [place] 03/12/2022 11:30 AM CDarrol Jump NP [facility] None  04/14/2022 12:00 PM [facility] None  04/14/2022 12:30 PM [facility]  04/15/2022  2:30 PM MUnk Pinto MD [facility] None  07/10/2022  2:00 PM MUnk Pinto MD [facility] None  10/09/2022 11:00 AM CDarrol Jump NP [facility] None     Plan:   During the course of the visit the patient was educated and counseled about appropriate screening and preventive services including:   Pneumococcal vaccine  Influenza vaccine Prevnar 13 Td vaccine Screening electrocardiogram Colorectal  cancer screening Diabetes  screening Glaucoma screening Nutrition counseling    Subjective:  [name] [name] is a 81 y.o. male who presents for Medicare Annual Wellness Visit and 3 month follow up for HTN, hyperlipidemia, prediabetes, and vitamin D Def.    In 2015, he underwent a CABG x 3 V w/po Afib- and failed 2 attempts at VCV. Then in 07/2014, he had a BiVent, AICD/Defib/PM planted. He is followed at his Cardiologist (Dr. Georg Ruddle) coumadin clinic.  Patient denies any cardiac symptoms as chest pain, palpitations, shortness of breath, dizziness or ankle swelling. He has dx of sleep apnea but did not tolerate CPAP 7 years ago. He has been concerned due to slowing of memory, having more difficulty recalling names, doing well managing meds with pill boxes.  Recently established with Dr. Brett Fairy and pending follow up after recent repeat sleep study.  Patient has Polycythemia Vera, myeloproliferative neoplasm, followed by Dr.  Irene Limbo at [facility] and treated by hydroxyurea and periodic therapeutic phlebotomies.    he has a diagnosis of GERD which is currently managed by prilosec 20 mg once daily prior to dinner, well controlled.   He has history of hemorrhoids, worse with constipation, will have blood with every BM. No pain with wiping. Last colonoscopy 2008, cologuard neg 2019. He notes in recent years has noted narrowing of stools in the last year. He takes colace BID to help prevent constipation.   BMI is There is no height or weight on file to calculate BMI., he admits hasn't been intentionally exercising, but does some house projects, works in yard trimming trees. He does occasionally use treadmill, step machine, rower at home. Has noted less of an appetite in recent years and eating smaller portions.  Wt Readings from Last 3 Encounters:  12/26/21 229 lb 12.8 oz (104.2 kg)  10/16/21 229 lb (103.9 kg)  10/10/21 229 lb 12.8 oz (104.2 kg)   His blood pressure has been controlled at home, today their BP is    He does workout. He denies chest pain, shortness of breath, dizziness.   He is on cholesterol medication (atorvastatin 40 mg daily) and denies myalgias. His LDL cholesterol is at goal. The cholesterol last visit was:   Lab Results  Component Value Date   CHOL 134 10/16/2021   HDL 29 (L) 10/16/2021   LDLCALC 84 10/16/2021   TRIG 113 10/16/2021   CHOLHDL 4.6 10/16/2021   He has been working on diet and exercise for hx of prediabetes, and denies increased appetite, nausea, paresthesia of the feet, polydipsia, polyuria, visual disturbances, vomiting and weight loss. Last A1C in the office was:  Lab Results  Component Value Date   HGBA1C 5.2 07/04/2021   He is on thyroid medication. His medication was not changed last visit. He is taking levothyroxine 200 mcg daily, has been taking 30 min prior to other food or meds since last vist.  Lab Results  Component Value Date   TSH 8.07 (H) 10/16/2021   Last GFR Lab Results  Component Value Date   GFRNONAA >60 12/10/2021   Patient is on Vitamin D supplement and at goal at recent check:    Lab Results  Component Value Date   VD25OH 85 07/04/2021     Patient is on allopurinol for gout and does not report a recent flare.  Lab Results  Component Value Date   LABURIC 4.5 08/13/2021     Medication Review:  Current Outpatient Medications (Endocrine & Metabolic):    levothyroxine (  SYNTHROID) 200 MCG tablet, TAKE 1 TAB DAILY ON EMPTY STOMACH WITH ONLY WATER FOR 30 MINS. NO ANTACIDS, CALCIUM OR MAGNESIUM FOR 4 HOURS. AVOID BIOTIN. (Patient taking differently: TAKE 1/2 TAB on Mon, Wed, Friday and take 1 whole tablet on all other days ON EMPTY STOMACH WITH ONLY WATER FOR 30 MINS. NO ANTACIDS, CALCIUM OR MAGNESIUM FOR 4 HOURS. AVOID BIOTIN.)  Current Outpatient Medications (Cardiovascular):    ezetimibe (ZETIA) 10 MG tablet, Take 1 tab daily at night for cholesterol.   isosorbide mononitrate (IMDUR) 30 MG 24 hr tablet, Take by mouth daily.    losartan (COZAAR) 100 MG tablet, Take 1 tablet (100 mg total) by mouth daily.   metoprolol (TOPROL-XL) 200 MG 24 hr tablet, Take 200 mg by mouth daily.   rosuvastatin (CRESTOR) 40 MG tablet, TAKE 1 TABLET EVERY DAY FOR CHOLESTEROL   spironolactone (ALDACTONE) 25 MG tablet, TAKE 1 TABLET EVERY DAY FOR BLOOD PRESSURE AND HEART FAILURE   torsemide (DEMADEX) 20 MG tablet, Take 1 tablet every Morning for Swelling in legs   Current Outpatient Medications (Analgesics):    acetaminophen (TYLENOL) 500 MG tablet, Take 500 mg by mouth as needed for mild pain.   allopurinol (ZYLOPRIM) 300 MG tablet, TAKE 1 TABLET EVERY DAY TO PREVENT GOUT   aspirin EC 81 MG tablet, Take 81 mg by mouth daily.  Current Outpatient Medications (Hematological):    Cyanocobalamin (B-12) 1000 MCG CAPS, Take by mouth.   warfarin (COUMADIN) 5 MG tablet, TAKE 1 TO 2 TABLETS EVERY DAY AS DIRECTED  Current Outpatient Medications (Other):    Biotin 10000 MCG TABS, Take by mouth.   Cholecalciferol (VITAMIN D PO), Take 10,000 Units by mouth daily.   Docosahexaenoic Acid (DHA OMEGA 3 PO), Take by mouth. 1000 mg   docusate sodium (COLACE) 100 MG capsule, Take 100 mg by mouth 2 (two) times daily. 1 tablet in the morning and 1 tablet at bedtime (stool softener)   finasteride (PROSCAR) 5 MG tablet, TAKE 1 TABLET EVERY DAY FOR PROSTATE   hydrocortisone (ANUSOL-HC) 2.5 % rectal cream, Place 1 application. rectally 3 (three) times daily. Use a small amount per rectum for external hemorrhoid for 10 days   hydroxyurea (HYDREA) 500 MG capsule, May take with food to minimize GI side effects.   magnesium oxide (MAG-OX) 400 MG tablet, Take by mouth.   MELATONIN PO, Take 1 tablet by mouth as needed.   Multiple Vitamin (MULTIVITAMIN) tablet, Take 1 tablet by mouth daily. A-Z   omeprazole (PRILOSEC) 20 MG capsule, TAKE 1 CAPSULE TWICE DAILY  ( EVERY 12 HOURS  ) TO PREVENT HEARTBURN AND ACID REFLUX   Probiotic Product (PROBIOTIC-10 PO), Take by  mouth daily.   tamsulosin (FLOMAX) 0.4 MG CAPS capsule, Take 1 tablet at Bedtime for Prostate & Bladder   terbinafine (LAMISIL) 250 MG tablet, Take 1 tablet Daily for Toenail Fungus (Patient not taking: Reported on 12/26/2021)   TURMERIC PO, Take 1,000 mg by mouth daily.   Zinc 50 MG TABS, Take by mouth.  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  Screening Tests Immunization History  Administered Date(s) Administered   Influenza, High Dose Seasonal PF 04/23/2015, 02/18/2017, 03/29/2018, 02/12/2019, 03/08/2021   Influenza,inj,Quad PF,6+ Mos 01/31/2016   Moderna SARS-COV2 Booster Vaccination 03/14/2020, 10/11/2020   Moderna Sars-Covid-2 Vaccination 06/18/2019, 07/18/2019   Pneumococcal Conjugate-13 11/21/2014   Pneumococcal Polysaccharide-23 06/01/2017   Preventative care: Last colonoscopy: 2008  Cologuard: 06/19/2020 negative  ECHO: 2018 CXR: 05/2017 - emphysematous changes, 50 pack year smoking history, not a candidate for low dose CT, quit greater than 15 years ago  Prior vaccinations: TD  or Tdap: Remote;  declines Influenza: ***  Pneumococcal: 2019 Prevnar13: 2016 Shingles/Zostavax: Zostavax can't recall when Covid 19: 2/2, + booster x 2, planning to get booster in [place]  Names of Other Physician/Practitioners you currently use: 1. [facility] here for primary care 2. [facility], eye doctor, last visit 2022 3. [facility] Pick, dentist, last visit 2022  Patient Care Team: [name], MD as PCP - General (Internal Medicine) Georg Ruddle, Ashok Cordia, MD as Referring Physician (Cardiology) Shirlee More Ronalee Red, MD as Referring Physician (Cardiology) Brunetta Genera, MD as Consulting Physician (Hematology) Rush Landmark, [facility] as Pharmacist (Pharmacist) Dohmeier, Asencion Partridge, MD as Consulting Physician (Neurology)  Surgical: He  has a past surgical history that includes Coronary artery bypass graft (11/2013); lumb laminectomy [id number]); Lumbar fusion (2013); Cardiac defibrillator placement (07/2014); Cataract extraction w/ intraocular lens implant (Left); Nasal septoplasty w/ turbinoplasty (Bilateral, 01/30/2016); Uvulectomy (N/A, 01/30/2016); and Tonsillectomy. Family His family history includes Alcohol abuse in his son; Alzheimer's disease in his mother; Dementia in his maternal grandmother; Depression in his mother; Heart attack in his son; Heart disease (age of onset: 6) in his father; Hypertension in his father; Mental illness in his mother; Stroke in his maternal grandmother. Social history  He reports that he quit smoking about 38 years ago. His smoking use included cigarettes. He has a 50.00 pack-year smoking history. He has never used smokeless tobacco. He reports that he does not currently use alcohol. He reports that he does not use drugs.  MEDICARE WELLNESS OBJECTIVES: Physical activity:   Cardiac risk factors:   Depression/mood screen:      07/04/2021    1:11 AM  Depression screen PHQ 2/9  Decreased Interest 0   Down, Depressed, Hopeless 0  PHQ - 2 Score 0    ADLs:     07/04/2021    1:12 AM  In your present state of health, do you have any difficulty performing the following activities:  Hearing? 0  Vision? 0  Difficulty concentrating or making decisions? 0  Walking or climbing stairs? 0  Dressing or bathing? 0  Doing errands, shopping? 0     Cognitive Testing  Alert? Yes  Normal Appearance?Yes  Oriented to person? Yes  Place? Yes   Time? Yes  Recall of three objects?  2/3   Can perform simple calculations? Yes  Displays appropriate judgment?Yes  Can read the correct time from a watch face?Yes  EOL planning:   Yes  Objective:   There were no vitals filed for this visit.   There is no height or weight on file to calculate BMI.  General appearance: alert, obese, no distress, WD/WN, male HEENT: normocephalic, sclerae anicteric, TMs  pearly, nares patent, no discharge or erythema, pharynx normal Oral cavity: MMM, no lesions Neck: supple, no lymphadenopathy, no thyromegaly, no masses Heart: RRR, normal S1, S2, mild 2/6 systolic murmur Lungs: Present throughout, symmetrical, No rhonchi, or rales or wheezing Abdomen: +bs, soft, non tender, non distended, no masses, no hepatomegaly, no splenomegaly Musculoskeletal: nontender, no swelling, no obvious deformity Extremities: no edema, no cyanosis, no clubbing Pulses: 2+ symmetric, upper and lower extremities, normal cap refill Neurological: alert, oriented x 3, CN2-12 intact, strength normal upper extremities and lower extremities, sensation normal throughout, DTRs 2+ throughout, no cerebellar signs, gait normal. Some slow recall.  Psychiatric: normal affect, behavior normal, pleasant   Medicare Attestation I have personally reviewed: The patient's medical and social history Their use of alcohol, tobacco or illicit drugs Their current medications and supplements The patient's functional ability including ADLs,fall risks, home safety  risks, cognitive, and hearing and visual impairment Diet and physical activities Evidence for depression or mood disorders  The patient's weight, height, BMI, and visual acuity have been recorded in the chart.  I have made referrals, counseling, and provided education to the patient based on review of the above and I have provided the patient with a written personalized care plan for preventive services.     Darrol Jump, NP   03/11/2022

## 2022-03-12 ENCOUNTER — Ambulatory Visit: Payer: Medicare Other | Admitting: Nurse Practitioner

## 2022-03-12 DIAGNOSIS — E559 Vitamin D deficiency, unspecified: Secondary | ICD-10-CM

## 2022-03-12 DIAGNOSIS — Z950 Presence of cardiac pacemaker: Secondary | ICD-10-CM

## 2022-03-12 DIAGNOSIS — N401 Enlarged prostate with lower urinary tract symptoms: Secondary | ICD-10-CM

## 2022-03-12 DIAGNOSIS — E039 Hypothyroidism, unspecified: Secondary | ICD-10-CM

## 2022-03-12 DIAGNOSIS — D6869 Other thrombophilia: Secondary | ICD-10-CM

## 2022-03-12 DIAGNOSIS — R7309 Other abnormal glucose: Secondary | ICD-10-CM

## 2022-03-12 DIAGNOSIS — D692 Other nonthrombocytopenic purpura: Secondary | ICD-10-CM

## 2022-03-12 DIAGNOSIS — G4733 Obstructive sleep apnea (adult) (pediatric): Secondary | ICD-10-CM

## 2022-03-12 DIAGNOSIS — I10 Essential (primary) hypertension: Secondary | ICD-10-CM

## 2022-03-12 DIAGNOSIS — I480 Paroxysmal atrial fibrillation: Secondary | ICD-10-CM

## 2022-03-12 DIAGNOSIS — I771 Stricture of artery: Secondary | ICD-10-CM

## 2022-03-12 DIAGNOSIS — D45 Polycythemia vera: Secondary | ICD-10-CM

## 2022-03-12 DIAGNOSIS — E782 Mixed hyperlipidemia: Secondary | ICD-10-CM

## 2022-03-12 DIAGNOSIS — Z0000 Encounter for general adult medical examination without abnormal findings: Secondary | ICD-10-CM

## 2022-03-12 DIAGNOSIS — I2510 Atherosclerotic heart disease of native coronary artery without angina pectoris: Secondary | ICD-10-CM

## 2022-03-12 DIAGNOSIS — K219 Gastro-esophageal reflux disease without esophagitis: Secondary | ICD-10-CM

## 2022-03-12 DIAGNOSIS — K645 Perianal venous thrombosis: Secondary | ICD-10-CM

## 2022-03-24 DIAGNOSIS — I255 Ischemic cardiomyopathy: Secondary | ICD-10-CM | POA: Diagnosis not present

## 2022-04-14 ENCOUNTER — Other Ambulatory Visit: Payer: Self-pay

## 2022-04-14 ENCOUNTER — Inpatient Hospital Stay: Payer: Medicare Other

## 2022-04-14 ENCOUNTER — Inpatient Hospital Stay: Payer: Medicare Other | Attending: Hematology | Admitting: Hematology

## 2022-04-14 VITALS — BP 92/52 | HR 71 | Temp 97.8°F | Resp 17 | Ht 67.0 in | Wt 224.4 lb

## 2022-04-14 DIAGNOSIS — D45 Polycythemia vera: Secondary | ICD-10-CM

## 2022-04-14 DIAGNOSIS — Z87891 Personal history of nicotine dependence: Secondary | ICD-10-CM | POA: Diagnosis not present

## 2022-04-14 LAB — CBC WITH DIFFERENTIAL (CANCER CENTER ONLY)
Abs Immature Granulocytes: 0.15 10*3/uL — ABNORMAL HIGH (ref 0.00–0.07)
Basophils Absolute: 0.1 10*3/uL (ref 0.0–0.1)
Basophils Relative: 1 %
Eosinophils Absolute: 0.1 10*3/uL (ref 0.0–0.5)
Eosinophils Relative: 1 %
HCT: 42.8 % (ref 39.0–52.0)
Hemoglobin: 14.8 g/dL (ref 13.0–17.0)
Immature Granulocytes: 1 %
Lymphocytes Relative: 11 %
Lymphs Abs: 1.6 10*3/uL (ref 0.7–4.0)
MCH: 39.8 pg — ABNORMAL HIGH (ref 26.0–34.0)
MCHC: 34.6 g/dL (ref 30.0–36.0)
MCV: 115.1 fL — ABNORMAL HIGH (ref 80.0–100.0)
Monocytes Absolute: 0.5 10*3/uL (ref 0.1–1.0)
Monocytes Relative: 4 %
Neutro Abs: 11.4 10*3/uL — ABNORMAL HIGH (ref 1.7–7.7)
Neutrophils Relative %: 82 %
Platelet Count: 344 10*3/uL (ref 150–400)
RBC: 3.72 MIL/uL — ABNORMAL LOW (ref 4.22–5.81)
RDW: 13.8 % (ref 11.5–15.5)
WBC Count: 13.8 10*3/uL — ABNORMAL HIGH (ref 4.0–10.5)
nRBC: 0 % (ref 0.0–0.2)

## 2022-04-14 LAB — CMP (CANCER CENTER ONLY)
ALT: 37 U/L (ref 0–44)
AST: 31 U/L (ref 15–41)
Albumin: 4.5 g/dL (ref 3.5–5.0)
Alkaline Phosphatase: 55 U/L (ref 38–126)
Anion gap: 4 — ABNORMAL LOW (ref 5–15)
BUN: 30 mg/dL — ABNORMAL HIGH (ref 8–23)
CO2: 29 mmol/L (ref 22–32)
Calcium: 10 mg/dL (ref 8.9–10.3)
Chloride: 99 mmol/L (ref 98–111)
Creatinine: 1.28 mg/dL — ABNORMAL HIGH (ref 0.61–1.24)
GFR, Estimated: 56 mL/min — ABNORMAL LOW (ref >60)
Glucose, Bld: 111 mg/dL — ABNORMAL HIGH (ref 70–99)
Potassium: 4.8 mmol/L (ref 3.5–5.1)
Sodium: 132 mmol/L — ABNORMAL LOW (ref 135–145)
Total Bilirubin: 1.2 mg/dL (ref 0.3–1.2)
Total Protein: 6.8 g/dL (ref 6.5–8.1)

## 2022-04-14 NOTE — Progress Notes (Shared)
HEMATOLOGY/ONCOLOGY PHONE VISIT NOTE  Date of Service: 04/14/22   Patient Care Team: Owens Loffler, DO as PCP - General (Family Medicine) Georg Ruddle, Ashok Cordia, MD as Referring Physician (Cardiology) Shirlee More Ronalee Red, MD as Referring Physician (Cardiology) Brunetta Genera, MD as Consulting Physician (Hematology) Rush Landmark, [facility] (Inactive) as Pharmacist (Pharmacist) Dohmeier, Asencion Partridge, MD as Consulting Physician (Neurology)  CHIEF COMPLAINT On bold that  HISTORY OF PRESENTING ILLNESS:  Please see previous note for details on initial presentation  INTERVAL HISTORY:  [name] [name] is here for continued evaluation and management of [name].   I had a phone visit with the patient on 12/11/2021 and was doing well overall, but complained of chronic fatigue and cognitive issues due to sleep apnea.   Patient is doing well overall since our last visit. He complains of persistent stomach pain that started after hydroxyurea. He regularly takes hydroxyurea around 8 PM everyday. He is taking Omeprazole 20 mg once a day, instead of twice daily for acid reflux and stomach pain.   He denies abnormal bowl moments and loss of appetite, but reports that he does not eat as much as he used to before.   He also complains of memory problems since our last visit. Patient complains of increased urination at night.   Patient notes he has changed his PCP to [facility] in [place] because he wants a PCP closer to his home.   MEDICAL HISTORY:  Past Medical History:  Diagnosis Date   A-fib ([place]) 01/2014   AICD (automatic cardioverter/defibrillator) present    [employer]   Anal fissure 11/10/2019   Arthritis    ASHD (arteriosclerotic heart disease) 2015   s/p CABG   Asthma    CAD (coronary artery disease)    Cataract    s/p left   CHF (congestive heart failure) (HCC)    COPD (chronic obstructive pulmonary disease) ([place])    by CXR, pt unsure of  this   Diverticulosis    GERD (gastroesophageal reflux disease)    Gout 2014   Hyperlipidemia    Hypertension    Hypothyroidism    LBBB (left bundle branch block)    Polycythemia    Pre-diabetes    Prediabetes 01/2014   Presence of permanent cardiac pacemaker    Sleep apnea    no CPAP   Thyroid disease    hypothyroid    SURGICAL HISTORY: Past Surgical History:  Procedure Laterality Date   CARDIAC DEFIBRILLATOR PLACEMENT  07/2014   CATARACT EXTRACTION W/ INTRAOCULAR LENS IMPLANT Left    CORONARY ARTERY BYPASS GRAFT  11/2013   lumb laminectomy  [phone]   x 3   LUMBAR FUSION  2013   NASAL SEPTOPLASTY W/ TURBINOPLASTY Bilateral 01/30/2016   Procedure: NASAL SEPTOPLASTY WITH BILATERAL TURBINATE REDUCTION;  Surgeon: Rozetta Nunnery, MD;  Location: [place];  Service: ENT;  Laterality: Bilateral;   TONSILLECTOMY     UVULECTOMY N/A 01/30/2016   Procedure: Martyn Ehrich;  Surgeon: Rozetta Nunnery, MD;  Location: [place];  Service: ENT;  Laterality: N/A;    SOCIAL HISTORY: Social History   Socioeconomic History   Marital status: Married    Spouse name: Kanosh    Number of children: 2   Years of education: 13   Highest education level: Not on file  Occupational History   Occupation: Retired  Tobacco Use   Smoking status: Former    Packs/day: 2.00    Years: 25.00  Total pack years: 50.00    Types: Cigarettes    Quit date: 05/20/1983    Years since quitting: 38.9   Smokeless tobacco: Never  Vaping Use   Vaping Use: Never used  Substance and Sexual Activity   Alcohol use: Not Currently    Alcohol/week: 0.0 standard drinks of alcohol   Drug use: No   Sexual activity: Not Currently  Other Topics Concern   Not on file  Social History Narrative   Lives with wife, [name]   Caffeine use: daily (Coffee)   Social Determinants of Health   Financial Resource Strain: Not on file  Food Insecurity: Not on file  Transportation Needs: No Transportation Needs (03/29/2021)    PRAPARE - Hydrologist (Medical): No    Lack of Transportation (Non-Medical): No  Physical Activity: Not on file  Stress: Not on file  Social Connections: Not on file  Intimate Partner Violence: Not on file    FAMILY HISTORY: Family History  Problem Relation Age of Onset   Alzheimer's disease Mother    Mental illness Mother    Depression Mother    Heart disease Father 29       had 53 MI's & died 17 yo   Hypertension Father    Stroke Maternal Grandmother    Dementia Maternal Grandmother    Alcohol abuse Son    Heart attack Son    Colon cancer Neg Hx    Stomach cancer Neg Hx    Esophageal cancer Neg Hx     ALLERGIES:  is allergic to other.  MEDICATIONS:  Current Outpatient Medications  Medication Sig Dispense Refill   acetaminophen (TYLENOL) 500 MG tablet Take 500 mg by mouth as needed for mild pain.     allopurinol (ZYLOPRIM) 300 MG tablet TAKE 1 TABLET EVERY DAY TO PREVENT GOUT 90 tablet 3   aspirin EC 81 MG tablet Take 81 mg by mouth daily.     Biotin 10000 MCG TABS Take by mouth.     Cholecalciferol (VITAMIN D PO) Take 10,000 Units by mouth daily.     Cyanocobalamin (B-12) 1000 MCG CAPS Take by mouth.     Docosahexaenoic Acid (DHA OMEGA 3 PO) Take by mouth. 1000 mg     docusate sodium (COLACE) 100 MG capsule Take 100 mg by mouth 2 (two) times daily. 1 tablet in the morning and 1 tablet at bedtime (stool softener)     ezetimibe (ZETIA) 10 MG tablet Take 1 tab daily at night for cholesterol. 90 tablet 3   finasteride (PROSCAR) 5 MG tablet TAKE 1 TABLET EVERY DAY FOR PROSTATE 90 tablet 3   hydrocortisone (ANUSOL-HC) 2.5 % rectal cream Place 1 application. rectally 3 (three) times daily. Use a small amount per rectum for external hemorrhoid for 10 days 30 g 1   hydroxyurea (HYDREA) 500 MG capsule May take with food to minimize GI side effects. 60 capsule 0   isosorbide mononitrate (IMDUR) 30 MG 24 hr tablet Take by mouth daily.     levothyroxine  (SYNTHROID) 200 MCG tablet TAKE 1 TAB DAILY ON EMPTY STOMACH WITH ONLY WATER FOR 30 MINS. NO ANTACIDS, CALCIUM OR MAGNESIUM FOR 4 HOURS. AVOID BIOTIN. (Patient taking differently: TAKE 1/2 TAB on Mon, Wed, Friday and take 1 whole tablet on all other days ON EMPTY STOMACH WITH ONLY WATER FOR 30 MINS. NO ANTACIDS, CALCIUM OR MAGNESIUM FOR 4 HOURS. AVOID BIOTIN.) 90 tablet 3   losartan (COZAAR) 100 MG tablet Take  1 tablet (100 mg total) by mouth daily. 90 tablet 3   magnesium oxide (MAG-OX) 400 MG tablet Take by mouth.     MELATONIN PO Take 1 tablet by mouth as needed.     metoprolol (TOPROL-XL) 200 MG 24 hr tablet Take 200 mg by mouth daily.     Multiple Vitamin (MULTIVITAMIN) tablet Take 1 tablet by mouth daily. A-Z     omeprazole (PRILOSEC) 20 MG capsule TAKE 1 CAPSULE TWICE DAILY  ( EVERY 12 HOURS  ) TO PREVENT HEARTBURN AND ACID REFLUX 180 capsule 3   Probiotic Product (PROBIOTIC-10 PO) Take by mouth daily.     rosuvastatin (CRESTOR) 40 MG tablet TAKE 1 TABLET EVERY DAY FOR CHOLESTEROL 90 tablet 3   spironolactone (ALDACTONE) 25 MG tablet TAKE 1 TABLET EVERY DAY FOR BLOOD PRESSURE AND HEART FAILURE 90 tablet 3   tamsulosin (FLOMAX) 0.4 MG CAPS capsule Take 1 tablet at Bedtime for Prostate & Bladder 90 capsule 3   terbinafine (LAMISIL) 250 MG tablet Take 1 tablet Daily for Toenail Fungus (Patient not taking: Reported on 12/26/2021) 90 tablet 0   torsemide (DEMADEX) 20 MG tablet Take 1 tablet every Morning for Swelling in legs 90 tablet 3   TURMERIC PO Take 1,000 mg by mouth daily.     warfarin (COUMADIN) 5 MG tablet TAKE 1 TO 2 TABLETS EVERY DAY AS DIRECTED 145 tablet 3   Zinc 50 MG TABS Take by mouth.     No current facility-administered medications for this visit.    REVIEW OF SYSTEMS:   10 Point review of Systems was done is negative except as noted above.  PHYSICAL EXAMINATION: Telemedicine visit LABORATORY DATA:  I have reviewed the data as listed  .    Latest Ref Rng & Units  12/26/2021    3:34 PM 12/10/2021    1:40 PM 10/16/2021   12:25 PM  CBC  WBC 3.8 - 10.8 Thousand/uL 16.7  14.5  13.6   Hemoglobin 13.2 - 17.1 g/dL 14.3  14.7  16.2   Hematocrit 38.5 - 50.0 % 39.4  42.2  44.6   Platelets 140 - 400 Thousand/uL 411  341  402     .    Latest Ref Rng & Units 12/10/2021    1:40 PM 10/16/2021   12:25 PM 10/10/2021    3:43 PM  CMP  Glucose 70 - 99 mg/dL 119  108  114   BUN 8 - 23 mg/dL 22  31  35   Creatinine 0.61 - 1.24 mg/dL 1.11  1.30  1.42   Sodium 135 - 145 mmol/L 134  135  133   Potassium 3.5 - 5.1 mmol/L 4.4  5.4  4.6   Chloride 98 - 111 mmol/L 98  99  97   CO2 22 - 32 mmol/L '31  27  26   '$ Calcium 8.9 - 10.3 mg/dL 9.4  10.0  9.4   Total Protein 6.5 - 8.1 g/dL 6.5  7.1    Total Bilirubin 0.3 - 1.2 mg/dL 1.4  1.1    Alkaline Phos 38 - 126 U/L 53     AST 15 - 41 U/L 30  25    ALT 0 - 44 U/L 31  25      02/09/18 JAK2 Mutation Study:    02/09/18 BCR ABL FISH:    RADIOGRAPHIC STUDIES: I have personally reviewed the radiological images as listed and agreed with the findings in the report. No results found.  ASSESSMENT &  PLAN:   81 y.o. male with  1. 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 Some associated leukocytosis. No history of splenectomy. No previous h/o VTE, but significant cardiac risk factors. 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   02/09/18 BCR ABL FISH revealed no evidence of BCR/ABL rearrangement   2. . 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[place]) 08/27/2020   JAK2 V617F mutation 08/27/2020   Rectal pain 11/10/2019   Constipation 11/10/2019   Abnormal glucose 10/12/2019   Memory changes 01/10/2019   Polycythemia vera ([place]) 10/21/2018   Thrombocytosis 12/29/2017   Senile purpura ([place]) 09/01/2017   Emphysema of lung ([place]) 06/01/2017   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  PLAN: -Discussed lab results from 04/14/2022. Labs showed WBC of 13.8 K, hemoglobin of 14.8 K, and platelets of 344 K. CMP showed creatine of 1.28.  -Patient reports no symptoms suggestive of infection. -Patient has no current indication for therapeutic phlebotomy at this time -No notable toxicity from his current dose of hydroxyurea -He will continue his hydroxyurea at 1000 mg p.o. daily -Recommend to follow the Omeprazole 20 mg dosage, twice a day, for acid reflux and stomach pain.      FOLLOW UP: RTC with Dr Irene Limbo with labs in 9month  The total time spent in the appointment was *** minutes* .  All of the patient's questions were answered with apparent satisfaction. The patient knows to call the clinic with any problems, questions or concerns.   GSullivan LoneMD MS AAHIVMS SHen[facility] CottageCFranklin HospitalHematology/Oncology  Physician [facility] .*Total Encounter Time as defined by the Centers for Medicare and Medicaid Services includes, in addition to the face-to-face time of a patient visit (documented in the note above) non-face-to-face time: obtaining and reviewing outside history, ordering and reviewing medications, tests or procedures, care coordination (communications with other health care professionals or caregivers) and documentation in the medical record.   IZettie Cooley am acting as a sEducation administratorfor GSullivan Lone MD.

## 2022-04-15 ENCOUNTER — Ambulatory Visit: Payer: Medicare Other | Admitting: Internal Medicine

## 2022-04-16 ENCOUNTER — Encounter: Payer: Self-pay | Admitting: Family Medicine

## 2022-04-16 ENCOUNTER — Ambulatory Visit (INDEPENDENT_AMBULATORY_CARE_PROVIDER_SITE_OTHER): Payer: Medicare Other | Admitting: Family Medicine

## 2022-04-16 VITALS — BP 108/74 | HR 70 | Temp 97.6°F | Ht 69.0 in | Wt 226.4 lb

## 2022-04-16 DIAGNOSIS — Z95810 Presence of automatic (implantable) cardiac defibrillator: Secondary | ICD-10-CM | POA: Diagnosis not present

## 2022-04-16 DIAGNOSIS — I5022 Chronic systolic (congestive) heart failure: Secondary | ICD-10-CM

## 2022-04-16 DIAGNOSIS — Z7689 Persons encountering health services in other specified circumstances: Secondary | ICD-10-CM

## 2022-04-16 DIAGNOSIS — I771 Stricture of artery: Secondary | ICD-10-CM | POA: Diagnosis not present

## 2022-04-16 DIAGNOSIS — I255 Ischemic cardiomyopathy: Secondary | ICD-10-CM

## 2022-04-16 DIAGNOSIS — I350 Nonrheumatic aortic (valve) stenosis: Secondary | ICD-10-CM | POA: Diagnosis not present

## 2022-04-16 DIAGNOSIS — I482 Chronic atrial fibrillation, unspecified: Secondary | ICD-10-CM | POA: Diagnosis not present

## 2022-04-16 DIAGNOSIS — I2510 Atherosclerotic heart disease of native coronary artery without angina pectoris: Secondary | ICD-10-CM

## 2022-04-16 DIAGNOSIS — I480 Paroxysmal atrial fibrillation: Secondary | ICD-10-CM | POA: Diagnosis not present

## 2022-04-16 MED ORDER — WARFARIN SODIUM 5 MG PO TABS: ORAL_TABLET | ORAL | 3 refills | Status: DC

## 2022-04-16 NOTE — Progress Notes (Signed)
New Patient Office Visit  Subjective    Patient ID: [name] [name], male    DOB: 1941/03/13  Age: 81 y.o. MRN: [id number]  CC:  Chief Complaint  Patient presents with   New Patient (Initial Visit)    Patient in office to est PCP. Patient requesting INR be checked today.     HPI [name] [name] presents to establish care. Pt requests to transfer care to this practice. Pt has hx of ischemic cardiomyopathy and CABG. Pt also has atrial fibrillation and taking Warfarin. He reports it's hard for him to drive 95-63 minutes to get the INR checked. Pt is looking to establish with a cardiologist in town so he doesn't have to drive far for INR checks. Pt has a ICD and pacemaker. He recently has had echo done today. Pt has hx of Polcythemia vera and has oncologist that he follows up with.  Pt has hx of gout and taking Allopurinol for prevention. For CHF he is taking Aldactone, Toprol, Torsemide, Losartan, and Imdur He has hx of hypothyroidism and taking Synthroid For BPH pt is taking Proscar and Flomax. All conditions are stable and chronic. No issues or concerns addressed except for pt wanting to get closer to home to establish cardiologist.   Outpatient Encounter Medications as of 04/16/2022  Medication Sig   acetaminophen (TYLENOL) 500 MG tablet Take 500 mg by mouth as needed for mild pain.   allopurinol (ZYLOPRIM) 300 MG tablet TAKE 1 TABLET EVERY DAY TO PREVENT GOUT   aspirin EC 81 MG tablet Take 81 mg by mouth daily.   Biotin 10000 MCG TABS Take by mouth.   Cholecalciferol (D 1000) 25 MCG (1000 UT) capsule Take 1,000 Units by mouth daily.   Cholecalciferol (VITAMIN D PO) Take 10,000 Units by mouth daily.   Cyanocobalamin (B-12) 1000 MCG CAPS Take by mouth.   Docosahexaenoic Acid (DHA OMEGA 3 PO) Take by mouth. 1000 mg   docusate sodium (COLACE) 100 MG capsule Take 100 mg by mouth 2 (two) times daily. 1 tablet in the morning and 1 tablet at bedtime (stool softener)   ezetimibe (ZETIA)  10 MG tablet Take 1 tab daily at night for cholesterol.   finasteride (PROSCAR) 5 MG tablet TAKE 1 TABLET EVERY DAY FOR PROSTATE   hydrocortisone (ANUSOL-HC) 2.5 % rectal cream Place 1 application. rectally 3 (three) times daily. Use a small amount per rectum for external hemorrhoid for 10 days   hydroxyurea (HYDREA) 500 MG capsule May take with food to minimize GI side effects.   isosorbide mononitrate (IMDUR) 30 MG 24 hr tablet Take by mouth daily.   levothyroxine (SYNTHROID) 200 MCG tablet TAKE 1 TAB DAILY ON EMPTY STOMACH WITH ONLY WATER FOR 30 MINS. NO ANTACIDS, CALCIUM OR MAGNESIUM FOR 4 HOURS. AVOID BIOTIN. (Patient taking differently: TAKE 1/2 TAB on Mon, Wed, Friday and take 1 whole tablet on all other days ON EMPTY STOMACH WITH ONLY WATER FOR 30 MINS. NO ANTACIDS, CALCIUM OR MAGNESIUM FOR 4 HOURS. AVOID BIOTIN.)   losartan (COZAAR) 100 MG tablet Take 1 tablet (100 mg total) by mouth daily.   magnesium oxide (MAG-OX) 400 MG tablet Take by mouth.   MELATONIN PO Take 1 tablet by mouth as needed.   Melatonin POWD Take 1 tablet by mouth as needed.   metoprolol (TOPROL-XL) 200 MG 24 hr tablet Take 200 mg by mouth daily.   Multiple Vitamin (MULTIVITAMIN) tablet Take 1 tablet by mouth daily. A-Z   omeprazole (PRILOSEC) 20 MG  capsule TAKE 1 CAPSULE TWICE DAILY  ( EVERY 12 HOURS  ) TO PREVENT HEARTBURN AND ACID REFLUX   Probiotic Product (PROBIOTIC-10 PO) Take by mouth daily.   rosuvastatin (CRESTOR) 40 MG tablet TAKE 1 TABLET EVERY DAY FOR CHOLESTEROL   spironolactone (ALDACTONE) 25 MG tablet TAKE 1 TABLET EVERY DAY FOR BLOOD PRESSURE AND HEART FAILURE   tamsulosin (FLOMAX) 0.4 MG CAPS capsule Take 1 tablet at Bedtime for Prostate & Bladder   torsemide (DEMADEX) 20 MG tablet Take 1 tablet every Morning for Swelling in legs   TURMERIC PO Take 1,000 mg by mouth daily.   Zinc 50 MG TABS Take by mouth.   [DISCONTINUED] warfarin (COUMADIN) 5 MG tablet TAKE 1 TO 2 TABLETS EVERY DAY AS DIRECTED    warfarin (COUMADIN) 5 MG tablet Take 1 tab po daily every day except on Mondays, take 1.5 tabs   [DISCONTINUED] diltiazem 2 % GEL Apply 1 Application topically in the morning, at noon, in the evening, and at bedtime. rectum (Patient not taking: Reported on 04/16/2022)   [DISCONTINUED] terbinafine (LAMISIL) 250 MG tablet Take 1 tablet Daily for Toenail Fungus (Patient not taking: Reported on 12/26/2021)   No facility-administered encounter medications on file as of 04/16/2022.    Past Medical History:  Diagnosis Date   A-fib ([place]) 01/2014   AICD (automatic cardioverter/defibrillator) present    [employer]   Anal fissure 11/10/2019   Arthritis    ASHD (arteriosclerotic heart disease) 2015   s/p CABG   Cataract    s/p left   CHF (congestive heart failure) (HCC)    COPD (chronic obstructive pulmonary disease) ([place])    by CXR, pt unsure of this   Diverticulosis    GERD (gastroesophageal reflux disease)    Gout 2014   Hyperlipidemia    Hypertension    Hypothyroidism    LBBB (left bundle branch block)    Polycythemia vera ([place])    Prediabetes 01/2014   Presence of permanent cardiac pacemaker    Sleep apnea    no CPAP    Past Surgical History:  Procedure Laterality Date   CARDIAC DEFIBRILLATOR PLACEMENT  07/2014   CATARACT EXTRACTION W/ INTRAOCULAR LENS IMPLANT Left    CORONARY ARTERY BYPASS GRAFT  11/2013   lumb laminectomy  [phone]   x 3   LUMBAR FUSION  2013   NASAL SEPTOPLASTY W/ TURBINOPLASTY Bilateral 01/30/2016   Procedure: NASAL SEPTOPLASTY WITH BILATERAL TURBINATE REDUCTION;  Surgeon: Rozetta Nunnery, MD;  Location: [place];  Service: ENT;  Laterality: Bilateral;   TONSILLECTOMY     UVULECTOMY N/A 01/30/2016   Procedure: Martyn Ehrich;  Surgeon: Rozetta Nunnery, MD;  Location: [place];  Service: ENT;  Laterality: N/A;    Family History  Problem Relation Age of Onset   Alzheimer's disease Mother    Mental illness Mother    Depression Mother    Heart  disease Father 16       had 76 MI's & died 29 yo   Hypertension Father    Alcohol abuse Son    Heart attack Son    Stroke Maternal Grandmother    Dementia Maternal Grandmother    Cancer - Prostate Other    Colon cancer Neg Hx    Stomach cancer Neg Hx    Esophageal cancer Neg Hx     Social History   Socioeconomic History   Marital status: Married    Spouse name: Amanda    Number of children: 2  Years of education: 62   Highest education level: Not on file  Occupational History   Occupation: Retired  Tobacco Use   Smoking status: Former    Packs/day: 2.00    Years: 25.00    Total pack years: 50.00    Types: Cigarettes    Quit date: 05/20/1983    Years since quitting: 38.9   Smokeless tobacco: Never  Vaping Use   Vaping Use: Never used  Substance and Sexual Activity   Alcohol use: Not Currently    Alcohol/week: 0.0 standard drinks of alcohol   Drug use: Never   Sexual activity: Not Currently  Other Topics Concern   Not on file  Social History Narrative   Lives with wife, [name]   Caffeine use: daily (Coffee)   Social Determinants of Health   Financial Resource Strain: Not on file  Food Insecurity: Not on file  Transportation Needs: No Transportation Needs (03/29/2021)   PRAPARE - Hydrologist (Medical): No    Lack of Transportation (Non-Medical): No  Physical Activity: Not on file  Stress: Not on file  Social Connections: Not on file  Intimate Partner Violence: Not on file    Review of Systems  Constitutional:  Negative for fever and malaise/fatigue.  HENT:  Negative for ear discharge and ear pain.   Eyes:  Negative for blurred vision and photophobia.  Respiratory:  Negative for cough and wheezing.   Cardiovascular:  Negative for chest pain and claudication.  Gastrointestinal:  Negative for heartburn, nausea and vomiting.  Genitourinary:  Negative for dysuria and flank pain.  Musculoskeletal:  Negative for neck pain.   Neurological:  Negative for dizziness and headaches.  Endo/Heme/Allergies:  Negative for polydipsia. Does not bruise/bleed easily.  Psychiatric/Behavioral:  Negative for depression. The patient does not have insomnia.         Objective    BP 108/74   Pulse 70   Temp 97.6 F (36.4 C)   Wt 226 lb 6 oz (102.7 kg)   SpO2 98%   BMI 35.46 kg/m   Physical Exam Vitals and nursing note reviewed.  Constitutional:      Appearance: Normal appearance. He is normal weight.  HENT:     Head: Normocephalic and atraumatic.     Right Ear: Tympanic membrane, ear canal and external ear normal.     Left Ear: Tympanic membrane, ear canal and external ear normal.     Nose: Nose normal.     Mouth/Throat:     Mouth: Mucous membranes are moist.     Pharynx: Oropharynx is clear.  Eyes:     Conjunctiva/sclera: Conjunctivae normal.     Pupils: Pupils are equal, round, and reactive to light.  Cardiovascular:     Rate and Rhythm: Normal rate. Rhythm irregular.  Pulmonary:     Effort: Pulmonary effort is normal.     Breath sounds: Normal breath sounds.  Abdominal:     General: Abdomen is flat. Bowel sounds are normal.  Musculoskeletal:        General: Normal range of motion.  Skin:    General: Skin is warm.     Capillary Refill: Capillary refill takes less than 2 seconds.  Neurological:     General: No focal deficit present.     Mental Status: He is alert and oriented to person, place, and time. Mental status is at baseline.  Psychiatric:        Mood and Affect: Mood normal.  Thought Content: Thought content normal.        Assessment & Plan:   Problem List Items Addressed This Visit       Cardiovascular and Mediastinum   Atrial fibrillation ([place])   Relevant Medications   warfarin (COUMADIN) 5 MG tablet   Other Relevant Orders   Ambulatory referral to Cardiology   Protime-INR   ASHD/CABG x 3V (11/2013)   Relevant Medications   warfarin (COUMADIN) 5 MG tablet   Other  Relevant Orders   Ambulatory referral to Cardiology   Tortuous aorta ([place])- CXR 2018   Relevant Medications   warfarin (COUMADIN) 5 MG tablet   Atherosclerotic heart disease of native coronary artery without angina pectoris   Relevant Medications   warfarin (COUMADIN) 5 MG tablet   Other Relevant Orders   Ambulatory referral to Cardiology   Ischemic cardiomyopathy   Relevant Medications   warfarin (COUMADIN) 5 MG tablet   Other Relevant Orders   Ambulatory referral to Cardiology   Chronic systolic (congestive) heart failure ([place])   Relevant Medications   warfarin (COUMADIN) 5 MG tablet   Other Relevant Orders   Ambulatory referral to Cardiology   Other Visit Diagnoses     Encounter to establish care with new doctor    -  Primary      Will do INR check and send pt to cardiologist to establish care.  To continue routine follow up with oncologist for PV All conditions chronic and stable. Will see back for AWV in 6 weeks.                                 Return in about 6 weeks (around 05/28/2022) for Sub AWV.   Leeanne Rio, MD

## 2022-04-17 ENCOUNTER — Telehealth: Payer: Self-pay | Admitting: Family Medicine

## 2022-04-17 ENCOUNTER — Encounter: Payer: Self-pay | Admitting: Hematology

## 2022-04-17 LAB — PROTIME-INR
INR: 3.8 — ABNORMAL HIGH (ref 0.9–1.2)
Prothrombin Time: 37.1 s — ABNORMAL HIGH (ref 9.1–12.0)

## 2022-04-17 NOTE — Telephone Encounter (Signed)
Attempted call to patient . Left voice mail message requesting a return call. Also noted on message that pt could check Mychart for results as well.

## 2022-04-17 NOTE — Telephone Encounter (Signed)
Patient stopped the office and was informed of results. Patient had already taken today's dose of coumadin. Per. Dr. Huel Cote- patient should skip tomorrow (Friday), Saturday and Sunday doses and restart on Monday 04/21/22.

## 2022-04-17 NOTE — Telephone Encounter (Signed)
Please inform pt his INR is too high at 3.8. Goal is between 2-3. He should hold his coumadin for today, tomorrow, and Saturday. Restart on Monday. Will recheck INR in 2 weeks. Avoid drinking cranberry and grapefruit juice. Also try to avoid excessive Vitamin K containing foods such as green leafy vegetables which all can alter the INR levels.

## 2022-04-23 ENCOUNTER — Ambulatory Visit: Payer: Medicare Other | Admitting: Family Medicine

## 2022-04-29 ENCOUNTER — Encounter: Payer: Self-pay | Admitting: Family Medicine

## 2022-04-29 DIAGNOSIS — Z23 Encounter for immunization: Secondary | ICD-10-CM | POA: Diagnosis not present

## 2022-04-30 ENCOUNTER — Other Ambulatory Visit: Payer: Self-pay

## 2022-04-30 DIAGNOSIS — N401 Enlarged prostate with lower urinary tract symptoms: Secondary | ICD-10-CM

## 2022-04-30 DIAGNOSIS — M1000 Idiopathic gout, unspecified site: Secondary | ICD-10-CM

## 2022-04-30 MED ORDER — ALLOPURINOL 300 MG PO TABS: ORAL_TABLET | ORAL | 3 refills | Status: DC

## 2022-04-30 MED ORDER — TAMSULOSIN HCL 0.4 MG PO CAPS: ORAL_CAPSULE | ORAL | 3 refills | Status: DC

## 2022-04-30 NOTE — Telephone Encounter (Signed)
[facility] requesting rx rf of allopurinol and tamsulosin Not filled by Dr.Rucker, sent for your review.

## 2022-05-01 ENCOUNTER — Other Ambulatory Visit: Payer: Self-pay | Admitting: Family Medicine

## 2022-05-01 ENCOUNTER — Ambulatory Visit: Payer: Medicare Other | Admitting: Family Medicine

## 2022-05-01 ENCOUNTER — Ambulatory Visit (INDEPENDENT_AMBULATORY_CARE_PROVIDER_SITE_OTHER): Payer: Medicare Other | Admitting: Family Medicine

## 2022-05-01 DIAGNOSIS — I5022 Chronic systolic (congestive) heart failure: Secondary | ICD-10-CM

## 2022-05-01 DIAGNOSIS — I480 Paroxysmal atrial fibrillation: Secondary | ICD-10-CM

## 2022-05-01 NOTE — Progress Notes (Signed)
INR check

## 2022-05-02 LAB — PROTIME-INR
INR: 1.7 — ABNORMAL HIGH (ref 0.9–1.2)
Prothrombin Time: 18 s — ABNORMAL HIGH (ref 9.1–12.0)

## 2022-05-07 DIAGNOSIS — Z23 Encounter for immunization: Secondary | ICD-10-CM | POA: Diagnosis not present

## 2022-05-28 ENCOUNTER — Encounter: Payer: Self-pay | Admitting: Family Medicine

## 2022-05-28 ENCOUNTER — Ambulatory Visit (INDEPENDENT_AMBULATORY_CARE_PROVIDER_SITE_OTHER): Payer: Medicare Other | Admitting: Family Medicine

## 2022-05-28 VITALS — BP 93/58 | HR 70 | Temp 97.8°F | Ht 69.0 in | Wt 225.6 lb

## 2022-05-28 DIAGNOSIS — I480 Paroxysmal atrial fibrillation: Secondary | ICD-10-CM

## 2022-05-28 DIAGNOSIS — D45 Polycythemia vera: Secondary | ICD-10-CM | POA: Diagnosis not present

## 2022-05-28 DIAGNOSIS — J449 Chronic obstructive pulmonary disease, unspecified: Secondary | ICD-10-CM | POA: Diagnosis not present

## 2022-05-28 DIAGNOSIS — E039 Hypothyroidism, unspecified: Secondary | ICD-10-CM

## 2022-05-28 DIAGNOSIS — Z5181 Encounter for therapeutic drug level monitoring: Secondary | ICD-10-CM

## 2022-05-28 DIAGNOSIS — Z7901 Long term (current) use of anticoagulants: Secondary | ICD-10-CM | POA: Diagnosis not present

## 2022-05-28 DIAGNOSIS — Z0000 Encounter for general adult medical examination without abnormal findings: Secondary | ICD-10-CM

## 2022-05-28 DIAGNOSIS — I771 Stricture of artery: Secondary | ICD-10-CM | POA: Diagnosis not present

## 2022-05-28 DIAGNOSIS — I5022 Chronic systolic (congestive) heart failure: Secondary | ICD-10-CM | POA: Diagnosis not present

## 2022-05-28 DIAGNOSIS — Z950 Presence of cardiac pacemaker: Secondary | ICD-10-CM | POA: Diagnosis not present

## 2022-05-28 DIAGNOSIS — G4733 Obstructive sleep apnea (adult) (pediatric): Secondary | ICD-10-CM

## 2022-05-28 NOTE — Progress Notes (Signed)
Annual Wellness Visit    Patient: [name] [name], Male    DOB: 1940-12-07, 82 y.o.   MRN: [id number]  Subjective  Chief Complaint  Patient presents with   annual wellness exam     Patient in office for annual wellness exam -medicare- patient states he has a "black area" on left side of chest x 1 month - possibly related to a fall at home .     [name] [name] is a 82 y.o. male who presents today for his Annual Wellness Visit. He reports consuming a general diet. Gym/ health club routine includes started Jan 1 with treadmill, elliptical, rowing machine and weights. He generally feels well. He reports sleeping well. He does not have additional problems to discuss today.   HPI Pt is establishing with new cardiologist for A fib/INR and ICD management. His appt is on Jan 29. He is switching from [facility] due to distance. Pt is on coumadin 5 mg daily except 1 and 1/2 tabs on Monday.  He has hx of Hypothyroidism and taking Levothyroxine 200 mcg daily. Pt has hx of Polycythemia vera and seeing Oncology. Has OSA but intolerant to CPAP and doesn't wear any at night currently.  Vision:Within last year   Patient Active Problem List   Diagnosis Date Noted   Zenker's diverticulum 10/16/2021   Loss of appetite 10/10/2021   Loss of weight 10/10/2021   Generalized abdominal pain 10/10/2021   Thrombosed external hemorrhoid 10/10/2021   Dysphagia 08/01/2021   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   Intolerance of continuous positive airway pressure (CPAP) ventilation 05/02/2021   MPN (myeloproliferative neoplasm) ([place]) 08/27/2020   JAK2 V617F mutation 08/27/2020   Rectal pain 11/10/2019   Constipation 11/10/2019   Abnormal glucose 10/12/2019   Memory changes 01/10/2019   Polycythemia vera ([place]) 10/21/2018   Thrombocytosis 12/29/2017   Senile purpura ([place]) 09/01/2017   Emphysema of lung ([place]) 06/01/2017   Tortuous aorta ([place])- CXR 2018 06/01/2017   Regular astigmatism, right eye  10/16/2016   Combined forms of age-related cataract of right eye 10/16/2016   Trigger ring finger of left hand 08/06/2016   Bilateral carpal tunnel syndrome 08/06/2016   Ischemic cardiomyopathy 06/22/2015   ASHD/CABG x 3V (11/2013) 02/06/2015   Acquired thrombophilia ([place]) 02/06/2015   Cardiac pacemaker/AICD (01/2014) 02/06/2015   Morbid obesity ([place]) 01/03/2015   HTN (hypertension) 01/02/2015   Hyperlipidemia, mixed 01/02/2015   Vitamin D deficiency 01/02/2015   GERD  01/02/2015   BPH (benign prostatic hyperplasia) 01/02/2015   Medication management 01/02/2015   Atrial fibrillation ([place]) 01/02/2015   Hypothyroidism 01/02/2015   OSA and COPD overlap syndrome ([place]) 00/17/4944   Chronic systolic (congestive) heart failure ([place]) 02/17/2014   S/P CABG x 3 02/13/2014   Atherosclerotic heart disease of native coronary artery without angina pectoris 02/08/2014   Gout 02/08/2014   Pulmonary hypertension due to left heart disease ([place]) 02/06/2014   Past Medical History:  Diagnosis Date   A-fib ([place]) 01/2014   AICD (automatic cardioverter/defibrillator) present    [employer]   Anal fissure 11/10/2019   Arthritis    ASHD (arteriosclerotic heart disease) 2015   s/p CABG   Cataract    s/p left   CHF (congestive heart failure) ([place])    COPD (chronic obstructive pulmonary disease) ([place])    by CXR, pt unsure of this   Diverticulosis    GERD (gastroesophageal reflux disease)    Gout 2014   Hyperlipidemia    Hypertension  Hypothyroidism    LBBB (left bundle branch block)    Polycythemia vera ([place])    Prediabetes 01/2014   Presence of permanent cardiac pacemaker    Sleep apnea    no CPAP   Past Surgical History:  Procedure Laterality Date   CARDIAC DEFIBRILLATOR PLACEMENT  07/2014   CATARACT EXTRACTION W/ INTRAOCULAR LENS IMPLANT Left    CORONARY ARTERY BYPASS GRAFT  11/2013   lumb laminectomy  [phone]   x 3   LUMBAR FUSION  2013   NASAL SEPTOPLASTY W/  TURBINOPLASTY Bilateral 01/30/2016   Procedure: NASAL SEPTOPLASTY WITH BILATERAL TURBINATE REDUCTION;  Surgeon: Rozetta Nunnery, MD;  Location: [facility];  Service: ENT;  Laterality: Bilateral;   TONSILLECTOMY     UVULECTOMY N/A 01/30/2016   Procedure: Martyn Ehrich;  Surgeon: Rozetta Nunnery, MD;  Location: [facility];  Service: ENT;  Laterality: N/A;   Social History   Tobacco Use   Smoking status: Former    Packs/day: 2.00    Years: 25.00    Total pack years: 50.00    Types: Cigarettes    Quit date: 05/20/1983    Years since quitting: 39.0   Smokeless tobacco: Never  Vaping Use   Vaping Use: Never used  Substance Use Topics   Alcohol use: Not Currently    Alcohol/week: 0.0 standard drinks of alcohol   Drug use: Never      Medications: Outpatient Medications Prior to Visit  Medication Sig   acetaminophen (TYLENOL) 500 MG tablet Take 500 mg by mouth as needed for mild pain.   allopurinol (ZYLOPRIM) 300 MG tablet TAKE 1 TABLET EVERY DAY TO PREVENT GOUT   aspirin EC 81 MG tablet Take 81 mg by mouth daily.   Biotin 10000 MCG TABS Take by mouth.   Cholecalciferol (D 1000) 25 MCG (1000 UT) capsule Take 1,000 Units by mouth daily.   Cholecalciferol (VITAMIN D PO) Take 10,000 Units by mouth daily.   Cyanocobalamin (B-12) 1000 MCG CAPS Take by mouth.   Docosahexaenoic Acid (DHA OMEGA 3 PO) Take by mouth. 1000 mg   docusate sodium (COLACE) 100 MG capsule Take 100 mg by mouth 2 (two) times daily. 1 tablet in the morning and 1 tablet at bedtime (stool softener)   ezetimibe (ZETIA) 10 MG tablet Take 1 tab daily at night for cholesterol.   finasteride (PROSCAR) 5 MG tablet TAKE 1 TABLET EVERY DAY FOR PROSTATE   hydrocortisone (ANUSOL-HC) 2.5 % rectal cream Place 1 application. rectally 3 (three) times daily. Use a small amount per rectum for external hemorrhoid for 10 days   hydroxyurea (HYDREA) 500 MG capsule May take with food to minimize GI side effects. (Patient taking differently: Take  1,000 mg by mouth daily. May take with food to minimize GI side effects.)   isosorbide mononitrate (IMDUR) 30 MG 24 hr tablet Take by mouth daily.   levothyroxine (SYNTHROID) 200 MCG tablet TAKE 1 TAB DAILY ON EMPTY STOMACH WITH ONLY WATER FOR 30 MINS. NO ANTACIDS, CALCIUM OR MAGNESIUM FOR 4 HOURS. AVOID BIOTIN. (Patient taking differently: TAKE 1/2 TAB on Mon, Wed, Friday and take 1 whole tablet on all other days ON EMPTY STOMACH WITH ONLY WATER FOR 30 MINS. NO ANTACIDS, CALCIUM OR MAGNESIUM FOR 4 HOURS. AVOID BIOTIN.)   losartan (COZAAR) 100 MG tablet Take 1 tablet (100 mg total) by mouth daily.   magnesium oxide (MAG-OX) 400 MG tablet Take by mouth.   MELATONIN PO Take 1 tablet by mouth as needed.   Melatonin  POWD Take 1 tablet by mouth as needed.   metoprolol (TOPROL-XL) 200 MG 24 hr tablet Take 200 mg by mouth daily.   Multiple Vitamin (MULTIVITAMIN) tablet Take 1 tablet by mouth daily. A-Z   omeprazole (PRILOSEC) 20 MG capsule TAKE 1 CAPSULE TWICE DAILY  ( EVERY 12 HOURS  ) TO PREVENT HEARTBURN AND ACID REFLUX   Probiotic Product (PROBIOTIC-10 PO) Take by mouth daily.   rosuvastatin (CRESTOR) 40 MG tablet TAKE 1 TABLET EVERY DAY FOR CHOLESTEROL   spironolactone (ALDACTONE) 25 MG tablet TAKE 1 TABLET EVERY DAY FOR BLOOD PRESSURE AND HEART FAILURE   tamsulosin (FLOMAX) 0.4 MG CAPS capsule Take 1 tablet at Bedtime for Prostate & Bladder   torsemide (DEMADEX) 20 MG tablet Take 1 tablet every Morning for Swelling in legs   TURMERIC PO Take 1,000 mg by mouth daily.   warfarin (COUMADIN) 5 MG tablet Take 1 tab po daily every day except on Mondays, take 1.5 tabs   Zinc 50 MG TABS Take by mouth.   No facility-administered medications prior to visit.    Allergies  Allergen Reactions   Other Swelling    SODIUM SENSITIVE    Patient Care Team: Leeanne Rio, MD as PCP - General (Family Medicine) Georg Ruddle, Ashok Cordia, MD as Referring Physician (Cardiology) Shirlee More Ronalee Red, MD as  Referring Physician (Cardiology) Brunetta Genera, MD as Consulting Physician (Hematology) Rush Landmark, [facility] (Inactive) as Pharmacist (Pharmacist) Dohmeier, Asencion Partridge, MD as Consulting Physician (Neurology)  Review of Systems  All other systems reviewed and are negative.      Objective  BP (!) 93/58   Pulse 70   Temp 97.8 F (36.6 C)   Ht '5\' 9"'$  (1.753 m)   Wt 225 lb 9 oz (102.3 kg)   SpO2 97%   BMI 33.31 kg/m     Physical Exam Vitals and nursing note reviewed.  Constitutional:      Appearance: Normal appearance. He is normal weight.  HENT:     Head: Normocephalic and atraumatic.  Cardiovascular:     Rate and Rhythm: Normal rate and regular rhythm.     Pulses: Normal pulses.     Heart sounds: Normal heart sounds.  Pulmonary:     Effort: Pulmonary effort is normal.     Breath sounds: Normal breath sounds.  Skin:    General: Skin is warm.     Capillary Refill: Capillary refill takes less than 2 seconds.  Neurological:     General: No focal deficit present.     Mental Status: He is alert and oriented to person, place, and time. Mental status is at baseline.  Psychiatric:        Mood and Affect: Mood normal.        Behavior: Behavior normal.        Thought Content: Thought content normal.        Judgment: Judgment normal.     Most recent functional status assessment:    05/28/2022    1:16 PM  In your present state of health, do you have any difficulty performing the following activities:  Hearing? 0  Vision? 0  Difficulty concentrating or making decisions? 1  Walking or climbing stairs? 1  Dressing or bathing? 0  Doing errands, shopping? 0   Most recent fall risk assessment:    05/28/2022    1:13 PM  Fall Risk   Falls in the past year? 1  Number falls in past yr: 0  Injury with Fall? 1  Risk for fall due to : History of fall(s)  Follow up Falls evaluation completed    Most recent depression screenings:    05/28/2022    1:17 PM 04/16/2022    1:36 PM   PHQ 2/9 Scores  PHQ - 2 Score 0 0  PHQ- 9 Score 3 0   Most recent cognitive screening:     No data to display         Most recent Audit-C alcohol use screening    12/18/2020    1:20 PM  Alcohol Use Disorder Test (AUDIT)  1. How often do you have a drink containing alcohol? 0  3. How often do you have six or more drinks on one occasion? 0   A score of 3 or more in women, and 4 or more in men indicates increased risk for alcohol abuse, EXCEPT if all of the points are from question 1   Vision/Hearing Screen: No results found.  Last metabolic panel Lab Results  Component Value Date   GLUCOSE 111 (H) 04/14/2022   NA 132 (L) 04/14/2022   K 4.8 04/14/2022   CL 99 04/14/2022   CO2 29 04/14/2022   BUN 30 (H) 04/14/2022   CREATININE 1.28 (H) 04/14/2022   GFRNONAA 56 (L) 04/14/2022   CALCIUM 10.0 04/14/2022   PROT 6.8 04/14/2022   ALBUMIN 4.5 04/14/2022   BILITOT 1.2 04/14/2022   ALKPHOS 55 04/14/2022   AST 31 04/14/2022   ALT 37 04/14/2022   ANIONGAP 4 (L) 04/14/2022      No results found for any visits on 05/28/22.    Assessment & Plan   Annual wellness visit done today including the all of the following: Reviewed patient's Family Medical History Reviewed and updated list of patient's medical providers Assessment of cognitive impairment was done Assessed patient's functional ability Established a written schedule for health screening services Health Risk Assessent Completed and Reviewed  Exercise Activities and Dietary recommendations  Goals      Exercise 150 min/wk Moderate Activity     LDL CALC < 70     Track and Manage Activity and Exertion-Heart Failure     Timeframe:  Long-Range Goal Priority:  High Start Date:                             Expected End Date:                       Follow Up Date 03/26/2021    - avoid heavy exercise on very hot days - drink water to stay hydrated during exercise - follow activity or exercise plan - make an activity  or exercise plan - pace activity allowing for rest - track exercise in diary for two weeks - track symptoms during activity in diary - warm up and cool down for 10 minutes before and after exercise    Why is this important?   Exercising is very important when managing your heart failure.  It will help your heart get stronger.    Notes:      Track and Manage My Blood Pressure-Hypertension     Timeframe:  Long-Range Goal Priority:  High Start Date:                             Expected End Date:  Follow Up Date 03/26/2021    - check blood pressure weekly - write blood pressure results in a log or diary    Why is this important?   You won't feel high blood pressure, but it can still hurt your blood vessels.  High blood pressure can cause heart or kidney problems. It can also cause a stroke.  Making lifestyle changes like losing a little weight or eating less salt will help.  Checking your blood pressure at home and at different times of the day can help to control blood pressure.  If the doctor prescribes medicine remember to take it the way the doctor ordered.  Call the office if you cannot afford the medicine or if there are questions about it.     Notes:      Weight (lb) < 250 lb (113.4 kg)        Immunization History  Administered Date(s) Administered   Covid-19, Mrna,Vaccine(Spikevax)85yr and older 04/29/2022   Influenza, High Dose Seasonal PF 04/23/2015, 02/18/2017, 03/29/2018, 02/12/2019, 03/08/2021   Influenza,inj,Quad PF,6+ Mos 01/31/2016   Influenza-Unspecified 05/15/2022   Moderna SARS-COV2 Booster Vaccination 03/14/2020, 10/11/2020   Moderna Sars-Covid-2 Vaccination 06/18/2019, 07/18/2019, 03/14/2020, 10/11/2020, 02/13/2022   PNEUMOCOCCAL CONJUGATE-20 05/07/2022   Pneumococcal Conjugate-13 11/21/2014   Pneumococcal Polysaccharide-23 06/01/2017   RSV,unspecified 04/29/2022   Zoster Recombinat (Shingrix) 05/07/2022    Health Maintenance   Topic Date Due   DTaP/Tdap/Td (1 - Tdap) Never done   COVID-19 Vaccine (5 - 2023-24 season) 06/24/2022   Zoster Vaccines- Shingrix (2 of 2) 07/02/2022   Medicare Annual Wellness (AWV)  05/29/2023   Pneumonia Vaccine 82 Years old  Completed   INFLUENZA VACCINE  Completed   HPV VACCINES  Aged Out     Discussed health benefits of physical activity, and encouraged him to engage in regular exercise appropriate for his age and condition.    Problem List Items Addressed This Visit       Cardiovascular and Mediastinum   Atrial fibrillation ([place]   Relevant Orders   Protime-INR   Basic Metabolic Panel (BMET)   Chronic systolic (congestive) heart failure (H[facility])   Relevant Orders   Basic Metabolic Panel (BMET)     Respiratory   OSA and COPD overlap syndrome ([place]     Endocrine   Hypothyroidism   Relevant Orders   TSH   T4, free     Other   Cardiac pacemaker/AICD (01/2014)   Other Visit Diagnoses     Medicare annual wellness visit, subsequent    -  Primary   Anticoagulation goal of INR 2 to 3       Relevant Orders   Protime-INR      Recheck PT/INR. Adjust coumadin if indicated. Remain on chronic medicines as prescribed except Losartan '100mg'$  daily for CHF. Pt is taking Metoprolol, Imdur, Aldactone and Torsemide. Noted to have low blood pressure today. Advised to stop Losartan until seen back in 2 weeks. Continue seeing specialist including new cardiologist on Jan 29 See back in 2 weeks recheck blood pressure after stopping Losartan '100mg'$  daily.  No follow-ups on file.     ALeeanne Rio MD

## 2022-05-28 NOTE — Patient Instructions (Signed)
Stop Losartan '100mg'$  for now due to low blood pressure today. Will see you back in 2 weeks for recheck.

## 2022-05-29 ENCOUNTER — Ambulatory Visit (INDEPENDENT_AMBULATORY_CARE_PROVIDER_SITE_OTHER): Payer: Medicare Other | Admitting: Family Medicine

## 2022-05-29 ENCOUNTER — Encounter: Payer: Self-pay | Admitting: Family Medicine

## 2022-05-29 VITALS — BP 98/59 | HR 70 | Temp 97.7°F | Ht 69.0 in | Wt 227.1 lb

## 2022-05-29 DIAGNOSIS — J029 Acute pharyngitis, unspecified: Secondary | ICD-10-CM | POA: Diagnosis not present

## 2022-05-29 DIAGNOSIS — S20212S Contusion of left front wall of thorax, sequela: Secondary | ICD-10-CM | POA: Diagnosis not present

## 2022-05-29 LAB — BASIC METABOLIC PANEL
BUN/Creatinine Ratio: 22 (ref 10–24)
BUN: 31 mg/dL — ABNORMAL HIGH (ref 8–27)
CO2: 23 mmol/L (ref 20–29)
Calcium: 9.6 mg/dL (ref 8.6–10.2)
Chloride: 94 mmol/L — ABNORMAL LOW (ref 96–106)
Creatinine, Ser: 1.4 mg/dL — ABNORMAL HIGH (ref 0.76–1.27)
Glucose: 142 mg/dL — ABNORMAL HIGH (ref 70–99)
Potassium: 4.9 mmol/L (ref 3.5–5.2)
Sodium: 132 mmol/L — ABNORMAL LOW (ref 134–144)
eGFR: 50 mL/min/{1.73_m2} — ABNORMAL LOW (ref >59)

## 2022-05-29 LAB — TSH: TSH: 2.82 u[IU]/mL (ref 0.450–4.500)

## 2022-05-29 LAB — PROTIME-INR
INR: 2.8 — ABNORMAL HIGH (ref 0.9–1.2)
Prothrombin Time: 28.3 s — ABNORMAL HIGH (ref 9.1–12.0)

## 2022-05-29 LAB — T4, FREE: Free T4: 1.42 ng/dL (ref 0.82–1.77)

## 2022-05-29 MED ORDER — FLUTICASONE PROPIONATE 50 MCG/ACT NA SUSP: 2.0000 | Freq: Every day | NASAL | 0 refills | Status: DC

## 2022-05-29 NOTE — Progress Notes (Signed)
Acute Office Visit  Subjective:     Patient ID: [name] [name], male    DOB: [birth year], 82 y.o.   MRN: [id number]  Chief Complaint  Patient presents with   New Patient (Initial Visit)    Patient in office -  c/o  continuous cough  , phlegm production  slight  ST x about one month( before christmas)  and check dark spot on chest x about one month-     HPI Patient is in today for acute visit.  Pt was seen here yesterday for wellness. He didn't mention his sore throat and bruise at that time. Pt is back today and report 1 month hx of sore throat. Not all the time. Comes and goes. Hasn't tried anything for the sore throat. He says it's not all day long. Also has some drainage that comes up. Pain is minimal.  He also reports a spot on the left side of his chest. Thinks he bumped into something. No pain. Says it's been present for the last week.  Review of Systems  HENT:  Positive for sore throat.   Respiratory:  Positive for sputum production. Negative for cough.   Endo/Heme/Allergies:  Bruises/bleeds easily.  All other systems reviewed and are negative.       Objective:    BP (!) 98/59   Pulse 70   Temp 97.7 F (36.5 C)   Ht '5\' 9"'$  (1.753 m)   Wt 227 lb 2 oz (103 kg)   SpO2 98%   BMI 33.54 kg/m    Physical Exam Vitals and nursing note reviewed.  Constitutional:      Appearance: Normal appearance. He is normal weight.  HENT:     Head: Normocephalic and atraumatic.     Right Ear: Tympanic membrane, ear canal and external ear normal.     Left Ear: Tympanic membrane, ear canal and external ear normal.     Nose: Nose normal.     Mouth/Throat:     Mouth: Mucous membranes are moist.     Pharynx: Oropharynx is clear. Posterior oropharyngeal erythema present.  Eyes:     Conjunctiva/sclera: Conjunctivae normal.     Pupils: Pupils are equal, round, and reactive to light.  Cardiovascular:     Rate and Rhythm: Normal rate.  Pulmonary:     Effort: Pulmonary effort is  normal.  Skin:    Capillary Refill: Capillary refill takes less than 2 seconds.     Findings: Bruising present.     Comments: Bruise to left anterior chest wall measuring 2 cm  Neurological:     General: No focal deficit present.     Mental Status: He is alert. Mental status is at baseline.    No results found for any visits on 05/29/22.      Assessment & Plan:   Problem List Items Addressed This Visit   None Visit Diagnoses     Sore throat    -  Primary   Relevant Medications   fluticasone (FLONASE) 50 MCG/ACT nasal spray   Contusion of left front wall of thorax, sequela           Meds ordered this encounter  Medications   fluticasone (FLONASE) 50 MCG/ACT nasal spray    Sig: Place 2 sprays into both nostrils daily.    Dispense:  16 g    Refill:  0   Sore throat likely a consequence of sinus drainage, seasonal? Have advised pt to try warm salt gargles, throat lozenges, honey  and tea along with tylenol PRN. Also will add flonase to try for sinus drainage Contusion to left anterior wall. Pt is on coumadin so likely the bruise is lingering. Will reassess in 2 weeks as scheduled when pt returns for recheck of HTN.  No follow-ups on file.  Leeanne Rio, MD

## 2022-06-02 NOTE — Progress Notes (Signed)
Referring-Alethea Rucker MD Reason for referral-congestive heart failure  HPI: 82 year old male for evaluation of congestive heart failure at request of Catalina Antigua MD.  Patient was previously followed by Bobbye Riggs at [facility].  Patient has a history of coronary artery disease and had coronary artery bypass and graft in 2015.  He also has a history of permanent atrial fibrillation, mild aortic stenosis, biventricular ICD and heart failure with improved ejection fraction.  CT of the abdomen June 2023 showed no aneurysm. Most recent echocardiogram November 2023 showed normal LV function, mild left ventricular hypertrophy, severe left atrial enlargement, mild right ventricular enlargement, mild to moderate aortic stenosis with mean gradient 20 mmHg, mild aortic insufficiency, mild to moderate mitral regurgitation and mild to moderate tricuspid regurgitation.  Patient denies chest pain.  He has dyspnea with more vigorous activities but not routine activities.  No orthopnea, PND, pedal edema or syncope.  Current Outpatient Medications  Medication Sig Dispense Refill   acetaminophen (TYLENOL) 500 MG tablet Take 500 mg by mouth as needed for mild pain.     allopurinol (ZYLOPRIM) 300 MG tablet TAKE 1 TABLET EVERY DAY TO PREVENT GOUT 90 tablet 3   aspirin EC 81 MG tablet Take 81 mg by mouth daily.     Biotin 10000 MCG TABS Take by mouth.     Cyanocobalamin (B-12) 1000 MCG CAPS Take by mouth.     Docosahexaenoic Acid (DHA OMEGA 3 PO) Take by mouth. 1000 mg     docusate sodium (COLACE) 100 MG capsule Take 100 mg by mouth 2 (two) times daily. 1 tablet in the morning and 1 tablet at bedtime (stool softener)     ezetimibe (ZETIA) 10 MG tablet Take 1 tab daily at night for cholesterol. 90 tablet 3   finasteride (PROSCAR) 5 MG tablet TAKE 1 TABLET EVERY DAY FOR PROSTATE 90 tablet 3   hydroxyurea (HYDREA) 500 MG capsule May take with food to minimize GI side effects. (Patient taking differently: Take  1,000 mg by mouth daily. May take with food to minimize GI side effects.) 60 capsule 0   isosorbide mononitrate (IMDUR) 30 MG 24 hr tablet Take 1 tablet (30 mg total) by mouth daily. Take by mouth daily. 90 tablet 3   levothyroxine (SYNTHROID) 200 MCG tablet TAKE 1 TAB DAILY ON EMPTY STOMACH WITH ONLY WATER FOR 30 MINS. NO ANTACIDS, CALCIUM OR MAGNESIUM FOR 4 HOURS. AVOID BIOTIN. (Patient taking differently: TAKE 1/2 TAB on Mon, Wed, Friday and take 1 whole tablet on all other days ON EMPTY STOMACH WITH ONLY WATER FOR 30 MINS. NO ANTACIDS, CALCIUM OR MAGNESIUM FOR 4 HOURS. AVOID BIOTIN.) 90 tablet 3   magnesium oxide (MAG-OX) 400 MG tablet Take by mouth.     MELATONIN PO Take 1 tablet by mouth as needed.     metoprolol (TOPROL-XL) 200 MG 24 hr tablet Take 200 mg by mouth daily.     Multiple Vitamin (MULTIVITAMIN) tablet Take 1 tablet by mouth daily. A-Z     omeprazole (PRILOSEC) 20 MG capsule TAKE 1 CAPSULE TWICE DAILY  ( EVERY 12 HOURS  ) TO PREVENT HEARTBURN AND ACID REFLUX 180 capsule 3   Probiotic Product (PROBIOTIC-10 PO) Take by mouth daily.     rosuvastatin (CRESTOR) 40 MG tablet Take 1 tablet (40 mg total) by mouth daily. 90 tablet 3   tamsulosin (FLOMAX) 0.4 MG CAPS capsule Take 1 tablet at Bedtime for Prostate & Bladder 90 capsule 3   torsemide (DEMADEX) 20 MG  tablet Take 1 tablet every Morning for Swelling in legs 90 tablet 3   TURMERIC PO Take 1,000 mg by mouth daily.     warfarin (COUMADIN) 5 MG tablet Take 1 tablet (5 mg total) by mouth daily. 90 tablet 0   Zinc 50 MG TABS Take by mouth.     No current facility-administered medications for this visit.    Allergies  Allergen Reactions   Other Swelling    SODIUM SENSITIVE     Past Medical History:  Diagnosis Date   A-fib ([place]) 01/2014   AICD (automatic cardioverter/defibrillator) present    [employer]   Anal fissure 11/10/2019   Arthritis    ASHD (arteriosclerotic heart disease) 2015   s/p CABG   Cataract    s/p  left   CHF (congestive heart failure) (HCC)    COPD (chronic obstructive pulmonary disease) ([place])    by CXR, pt unsure of this   Diverticulosis    GERD (gastroesophageal reflux disease)    Gout 2014   Hyperlipidemia    Hypertension    Hypothyroidism    LBBB (left bundle branch block)    Polycythemia vera ([place])    Prediabetes 01/2014   Presence of permanent cardiac pacemaker    Sleep apnea    no CPAP    Past Surgical History:  Procedure Laterality Date   CARDIAC DEFIBRILLATOR PLACEMENT  07/2014   CATARACT EXTRACTION W/ INTRAOCULAR LENS IMPLANT Left    CORONARY ARTERY BYPASS GRAFT  11/2013   lumb laminectomy  8850,[id number]   x 3   LUMBAR FUSION  2013   NASAL SEPTOPLASTY W/ TURBINOPLASTY Bilateral 01/30/2016   Procedure: NASAL SEPTOPLASTY WITH BILATERAL TURBINATE REDUCTION;  Surgeon: Rozetta Nunnery, MD;  Location: [facility];  Service: ENT;  Laterality: Bilateral;   TONSILLECTOMY     UVULECTOMY N/A 01/30/2016   Procedure: Martyn Ehrich;  Surgeon: Rozetta Nunnery, MD;  Location: [employer] [facility];  Service: ENT;  Laterality: N/A;    Social History   Socioeconomic History   Marital status: Married    Spouse name: [place]    Number of children: 2   Years of education: 13   Highest education level: Not on file  Occupational History   Occupation: Retired  Tobacco Use   Smoking status: Former    Packs/day: 2.00    Years: 25.00    Total pack years: 50.00    Types: Cigarettes    Quit date: 05/20/1983    Years since quitting: 39.1   Smokeless tobacco: Never  Vaping Use   Vaping Use: Never used  Substance and Sexual Activity   Alcohol use: Never   Drug use: Never   Sexual activity: Not Currently  Other Topics Concern   Not on file  Social History Narrative   Lives with wife, [name]   Caffeine use: daily (Coffee)   Social Determinants of Health   Financial Resource Strain: Not on file  Food Insecurity: Not on file  Transportation Needs: No Transportation Needs (03/29/2021)    PRAPARE - Hydrologist (Medical): No    Lack of Transportation (Non-Medical): No  Physical Activity: Not on file  Stress: Not on file  Social Connections: Not on file  Intimate Partner Violence: Not on file    Family History  Problem Relation Age of Onset   Alzheimer's disease Mother    Mental illness Mother    Depression Mother    Heart disease Father 58  had 5 MI's & died 75 yo   Hypertension Father    Alcohol abuse Son    Heart attack Son    Stroke Maternal Grandmother    Dementia Maternal Grandmother    Cancer - Prostate Other    Colon cancer Neg Hx    Stomach cancer Neg Hx    Esophageal cancer Neg Hx     ROS: no fevers or chills, productive cough, hemoptysis, dysphasia, odynophagia, melena, hematochezia, dysuria, hematuria, rash, seizure activity, orthopnea, PND, pedal edema, claudication. Remaining systems are negative.  Physical Exam:   Blood pressure 115/79, pulse 70, height '5\' 7"'$  (1.702 m), weight 222 lb (100.7 kg), SpO2 96 %.  General:  Well developed/well nourished in NAD Skin warm/dry Patient not depressed No peripheral clubbing Back-normal HEENT-normal/normal eyelids Neck supple/normal carotid upstroke bilaterally; no bruits; no JVD; no thyromegaly chest - CTA/ normal expansion; status post sternotomy; ICD left chest CV - RRR/normal S1 and S2; no rubs or gallops;  PMI nondisplaced; 2/6 systolic murmur left sternal border. Abdomen -NT/ND, no HSM, no mass, + bowel sounds, no bruit 2+ femoral pulses, no bruits Ext-no edema, chords, 2+ DP Neuro-grossly nonfocal  ECG -ventricular pacing with underlying atrial fibrillation.  Personally reviewed  A/P  1 coronary artery disease status post coronary bypass and graft-patient denies chest pain.  Will add statin.  2 permanent atrial fibrillation-patient will continue on Toprol for rate control.  He is on Coumadin with goal INR 2-3 followed by primary care.  I will provide the name  of apixaban and Xarelto and he will check to see if affordable.  3 history of biventricular ICD-we will arrange follow-up with Dr. Curt Bears in [facility].  4 heart failure improved ejection fraction-patient's most recent ejection fraction had normalized.  He is euvolemic on examination.  Continue Demadex at present dose.  5 hyperlipidemia-continue Crestor and Zetia.  6 hypertension-blood pressure controlled.  Continue present medications and follow.  7 history of mild-moderate aortic stenosis-we will plan follow-up echocardiogram November 2024.  Kirk Ruths, MD

## 2022-06-05 ENCOUNTER — Other Ambulatory Visit: Payer: Self-pay

## 2022-06-05 DIAGNOSIS — I872 Venous insufficiency (chronic) (peripheral): Secondary | ICD-10-CM

## 2022-06-05 DIAGNOSIS — E782 Mixed hyperlipidemia: Secondary | ICD-10-CM

## 2022-06-05 MED ORDER — ISOSORBIDE MONONITRATE ER 30 MG PO TB24
30.0000 mg | ORAL_TABLET | Freq: Every day | ORAL | 3 refills | Status: DC
Start: 2022-06-05 — End: 2022-12-15

## 2022-06-05 MED ORDER — ROSUVASTATIN CALCIUM 40 MG PO TABS: 40.0000 mg | ORAL_TABLET | Freq: Every day | ORAL | 3 refills | Status: DC

## 2022-06-05 MED ORDER — TORSEMIDE 20 MG PO TABS: ORAL_TABLET | ORAL | 3 refills | Status: DC

## 2022-06-05 NOTE — Telephone Encounter (Signed)
Prescription refill requests for  torsemide '20mg'$  tab, isosorbide mono ER '30mg'$  tab,  and rosuvastatin '40mg'$  tab -  All historical or previous provider  last fills.  Sent for your review.

## 2022-06-08 ENCOUNTER — Other Ambulatory Visit: Payer: Self-pay | Admitting: Internal Medicine

## 2022-06-08 DIAGNOSIS — E039 Hypothyroidism, unspecified: Secondary | ICD-10-CM

## 2022-06-11 ENCOUNTER — Ambulatory Visit (INDEPENDENT_AMBULATORY_CARE_PROVIDER_SITE_OTHER): Payer: Medicare Other | Admitting: Family Medicine

## 2022-06-11 ENCOUNTER — Encounter: Payer: Self-pay | Admitting: Family Medicine

## 2022-06-11 VITALS — BP 96/59 | HR 71 | Temp 97.4°F | Ht 69.0 in | Wt 222.5 lb

## 2022-06-11 DIAGNOSIS — Z7901 Long term (current) use of anticoagulants: Secondary | ICD-10-CM

## 2022-06-11 DIAGNOSIS — I480 Paroxysmal atrial fibrillation: Secondary | ICD-10-CM

## 2022-06-11 DIAGNOSIS — R1319 Other dysphagia: Secondary | ICD-10-CM | POA: Diagnosis not present

## 2022-06-11 DIAGNOSIS — I10 Essential (primary) hypertension: Secondary | ICD-10-CM

## 2022-06-11 DIAGNOSIS — Z5181 Encounter for therapeutic drug level monitoring: Secondary | ICD-10-CM

## 2022-06-11 NOTE — Progress Notes (Signed)
Established Patient Office Visit  Subjective   Patient ID: [name] [name], male    DOB: December 06, 1940  Age: 82 y.o. MRN: [id number]  Chief Complaint  Patient presents with   blood pressure follow up    Paient in office for blood pressure f/u    Sore Throat    Patient c/o  Sore throat where nose meets throat continuing - states  daily when taking medication seems the pills seems like they get stuck " like there is another area there that they go into " - does not take all of medications when this happens. Waits until it is cleared- seems like pills will dissolve there and cause unpleasant taste in mouth as they desolve.    Hypertension Pt is here for follow up for Hypotension. Last visit, I stopped his Losartan. He continued to take the Imdur '30mg'$ , Metoprolol '200mg'$  daily, Torsemide '20mg'$  and Aldactone '25mg'$  daily. His blood pressure is still low today.  Has appt this week with new cardiologist establish care.  Atrial Fibrillation Pt has A fib and on coumadin. Last INR was 2.8. will recheck today to make sure not too elevated.  Dysphagia Pt is having pills to get stuck in his throat after taking. He reports this has been present for the last few months but getting worse.   Patient Active Problem List   Diagnosis Date Noted   Zenker's diverticulum 10/16/2021   Loss of appetite 10/10/2021   Loss of weight 10/10/2021   Generalized abdominal pain 10/10/2021   Thrombosed external hemorrhoid 10/10/2021   Dysphagia 08/01/2021   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   Intolerance of continuous positive airway pressure (CPAP) ventilation 05/02/2021   MPN (myeloproliferative neoplasm) ([place]) 08/27/2020   JAK2 V617F mutation 08/27/2020   Rectal pain 11/10/2019   Constipation 11/10/2019   Abnormal glucose 10/12/2019   Memory changes 01/10/2019   Polycythemia vera ([place]) 10/21/2018   Thrombocytosis 12/29/2017   Senile purpura ([place]) 09/01/2017   Emphysema of lung ([place]) 06/01/2017   Tortuous  aorta ([place])- CXR 2018 06/01/2017   Regular astigmatism, right eye 10/16/2016   Combined forms of age-related cataract of right eye 10/16/2016   Trigger ring finger of left hand 08/06/2016   Bilateral carpal tunnel syndrome 08/06/2016   Ischemic cardiomyopathy 06/22/2015   ASHD/CABG x 3V (11/2013) 02/06/2015   Acquired thrombophilia ([place]) 02/06/2015   Cardiac pacemaker/AICD (01/2014) 02/06/2015   Morbid obesity ([place]) 01/03/2015   HTN (hypertension) 01/02/2015   Hyperlipidemia, mixed 01/02/2015   Vitamin D deficiency 01/02/2015   GERD  01/02/2015   BPH (benign prostatic hyperplasia) 01/02/2015   Medication management 01/02/2015   Atrial fibrillation ([place]) 01/02/2015   Hypothyroidism 01/02/2015   OSA and COPD overlap syndrome ([place]) 25/42/7062   Chronic systolic (congestive) heart failure ([place]) 02/17/2014   S/P CABG x 3 02/13/2014   Atherosclerotic heart disease of native coronary artery without angina pectoris 02/08/2014   Gout 02/08/2014   Pulmonary hypertension due to left heart disease ([place]) 02/06/2014    Review of Systems  Constitutional:  Negative for malaise/fatigue.  HENT:  Negative for congestion and ear pain.   Cardiovascular:  Negative for chest pain and palpitations.  Gastrointestinal:  Negative for abdominal pain.       Dysphagia  Neurological:  Negative for dizziness and headaches.  All other systems reviewed and are negative.     Objective:     BP (!) 96/59   Pulse 71   Temp (!) 97.4 F (36.3 C)   Ht  $'5\' 9"'s$  (1.753 m)   Wt 222 lb 8 oz (100.9 kg)   SpO2 98%   BMI 32.86 kg/m    Physical Exam Vitals reviewed.  Constitutional:      Appearance: He is well-developed and normal weight.  HENT:     Head: Normocephalic and atraumatic.     Mouth/Throat:     Mouth: Mucous membranes are moist.  Cardiovascular:     Rate and Rhythm: Rhythm irregular.  Pulmonary:     Effort: Pulmonary effort is normal.  Skin:    General: Skin is warm.     Capillary Refill:  Capillary refill takes less than 2 seconds.  Neurological:     General: No focal deficit present.     Mental Status: He is alert and oriented to person, place, and time.  Psychiatric:        Mood and Affect: Mood normal.        Behavior: Behavior normal.     No results found for any visits on 06/11/22.    The ASCVD Risk score (Arnett DK, et al., 2019) failed to calculate for the following reasons:   The 2019 ASCVD risk score is only valid for ages 76 to 93    Assessment & Plan:   Problem List Items Addressed This Visit       Cardiovascular and Mediastinum   HTN (hypertension)   Relevant Orders   Basic metabolic panel   Atrial fibrillation ([place]) - Primary   Relevant Orders   Protime-INR     Digestive   Dysphagia   Relevant Orders   Ambulatory referral to Gastroenterology   Other Visit Diagnoses     Anticoagulation goal of INR 2 to 3       Relevant Orders   Protime-INR      Due to higher end of normal INR 2 weeks ago, will recheck today to make sure warfarin dosing doesn't need to be adjusted. Creatinine slightly elevated last visit on routine visit. Pt had his Losartan discontinued due to hypotension. He is taking Torsemide and Aldactone from previous cardiologist. Will discontinue Aldactone for now. Recheck BMP today.  Due to dysphagia and pills getting stuck, suspect esophageal stricture? Will  refer to GI for EGD and evaluation.   No follow-ups on file.    Leeanne Rio, MD

## 2022-06-12 DIAGNOSIS — R159 Full incontinence of feces: Secondary | ICD-10-CM | POA: Diagnosis not present

## 2022-06-12 DIAGNOSIS — R1319 Other dysphagia: Secondary | ICD-10-CM | POA: Diagnosis not present

## 2022-06-12 DIAGNOSIS — K5904 Chronic idiopathic constipation: Secondary | ICD-10-CM | POA: Diagnosis not present

## 2022-06-12 LAB — BASIC METABOLIC PANEL
BUN/Creatinine Ratio: 24 (ref 10–24)
BUN: 29 mg/dL — ABNORMAL HIGH (ref 8–27)
CO2: 24 mmol/L (ref 20–29)
Calcium: 9.6 mg/dL (ref 8.6–10.2)
Chloride: 95 mmol/L — ABNORMAL LOW (ref 96–106)
Creatinine, Ser: 1.21 mg/dL (ref 0.76–1.27)
Glucose: 133 mg/dL — ABNORMAL HIGH (ref 70–99)
Potassium: 5.2 mmol/L (ref 3.5–5.2)
Sodium: 134 mmol/L (ref 134–144)
eGFR: 60 mL/min/{1.73_m2} (ref >59)

## 2022-06-12 LAB — PROTIME-INR
INR: 3.5 — ABNORMAL HIGH (ref 0.9–1.2)
Prothrombin Time: 34.5 s — ABNORMAL HIGH (ref 9.1–12.0)

## 2022-06-13 ENCOUNTER — Other Ambulatory Visit: Payer: Self-pay | Admitting: Family Medicine

## 2022-06-13 DIAGNOSIS — I480 Paroxysmal atrial fibrillation: Secondary | ICD-10-CM

## 2022-06-13 MED ORDER — WARFARIN SODIUM 5 MG PO TABS: 5.0000 mg | ORAL_TABLET | Freq: Every day | ORAL | 0 refills | Status: DC

## 2022-06-16 ENCOUNTER — Encounter: Payer: Self-pay | Admitting: Cardiology

## 2022-06-16 ENCOUNTER — Ambulatory Visit (INDEPENDENT_AMBULATORY_CARE_PROVIDER_SITE_OTHER): Payer: Medicare Other | Admitting: Cardiology

## 2022-06-16 VITALS — BP 115/79 | HR 70 | Ht 67.0 in | Wt 222.0 lb

## 2022-06-16 DIAGNOSIS — I2510 Atherosclerotic heart disease of native coronary artery without angina pectoris: Secondary | ICD-10-CM | POA: Diagnosis not present

## 2022-06-16 DIAGNOSIS — E782 Mixed hyperlipidemia: Secondary | ICD-10-CM | POA: Diagnosis not present

## 2022-06-16 DIAGNOSIS — I4821 Permanent atrial fibrillation: Secondary | ICD-10-CM | POA: Diagnosis not present

## 2022-06-16 DIAGNOSIS — I10 Essential (primary) hypertension: Secondary | ICD-10-CM

## 2022-06-16 DIAGNOSIS — Z950 Presence of cardiac pacemaker: Secondary | ICD-10-CM | POA: Diagnosis not present

## 2022-06-16 DIAGNOSIS — I255 Ischemic cardiomyopathy: Secondary | ICD-10-CM

## 2022-06-16 NOTE — Patient Instructions (Signed)
Medication Instructions:   ELIQUIS OR XARELTO ALTERNATIVE TO WARFARIN  *If you need a refill on your cardiac medications before your next appointment, please call your pharmacy*   Follow-Up: At [facility], you and your health needs are our priority.  As part of our continuing mission to provide you with exceptional heart care, we have created designated Provider Care Teams.  These Care Teams include your primary Cardiologist (physician) and Advanced Practice Providers (APPs -  Physician Assistants and Nurse Practitioners) who all work together to provide you with the care you need, when you need it.  We recommend signing up for the patient portal called "MyChart".  Sign up information is provided on this After Visit Summary.  MyChart is used to connect with patients for Virtual Visits (Telemedicine).  Patients are able to view lab/test results, encounter notes, upcoming appointments, etc.  Non-urgent messages can be sent to your provider as well.   To learn more about what you can do with MyChart, go to [email or web address].    Your next appointment:   6 month(s)  Provider:   Kirk Ruths, MD

## 2022-06-17 ENCOUNTER — Telehealth: Payer: Self-pay | Admitting: Hematology

## 2022-06-17 NOTE — Telephone Encounter (Signed)
Called patient to r/s appointments due to provider PAL. Patient r/s and notified.

## 2022-06-30 DIAGNOSIS — L089 Local infection of the skin and subcutaneous tissue, unspecified: Secondary | ICD-10-CM | POA: Diagnosis not present

## 2022-07-07 ENCOUNTER — Encounter: Payer: Self-pay | Admitting: Cardiology

## 2022-07-07 ENCOUNTER — Ambulatory Visit: Payer: Medicare Other | Attending: Cardiology | Admitting: Cardiology

## 2022-07-07 VITALS — BP 110/74 | HR 70 | Ht 67.0 in | Wt 227.0 lb

## 2022-07-07 DIAGNOSIS — I5022 Chronic systolic (congestive) heart failure: Secondary | ICD-10-CM | POA: Diagnosis not present

## 2022-07-07 DIAGNOSIS — I10 Essential (primary) hypertension: Secondary | ICD-10-CM | POA: Insufficient documentation

## 2022-07-07 DIAGNOSIS — D6869 Other thrombophilia: Secondary | ICD-10-CM

## 2022-07-07 DIAGNOSIS — I4821 Permanent atrial fibrillation: Secondary | ICD-10-CM | POA: Diagnosis not present

## 2022-07-07 DIAGNOSIS — I2510 Atherosclerotic heart disease of native coronary artery without angina pectoris: Secondary | ICD-10-CM | POA: Diagnosis not present

## 2022-07-07 NOTE — Progress Notes (Signed)
Electrophysiology Office Note   Date:  07/07/2022   ID:  [name] [name], DOB 11-14-1940, MRN [id number]  PCP:  Leeanne Rio, MD  Cardiologist:  Stanford Breed Primary Electrophysiologist:  Savion Washam Meredith Leeds, MD    Chief Complaint: ICD   History of Present Illness: [name] [name] is a 82 y.o. male who is being seen today for the evaluation of ICD at the request of Stanford Breed, Denice Bors, MD. Presenting today for electrophysiology evaluation.  He has a history significant for atrial fibrillation, chronic systolic heart failure status post [employer] CRT-D, coronary artery disease post CABG, COPD, hypertension, hyperlipidemia, sleep apnea not on CPAP.  CABG was in 2015.  Most recent echo showed a normal ejection fraction with severe left atrial enlargement.  Today, he denies symptoms of palpitations, chest pain, shortness of breath, orthopnea, PND, lower extremity edema, claudication, dizziness, presyncope, syncope, bleeding, or neurologic sequela. The patient is tolerating medications without difficulties.    Past Medical History:  Diagnosis Date   A-fib ([place]) 01/2014   AICD (automatic cardioverter/defibrillator) present    [employer]   Anal fissure 11/10/2019   Arthritis    ASHD (arteriosclerotic heart disease) 2015   s/p CABG   Cataract    s/p left   CHF (congestive heart failure) (HCC)    COPD (chronic obstructive pulmonary disease) ([place])    by CXR, pt unsure of this   Diverticulosis    GERD (gastroesophageal reflux disease)    Gout 2014   Hyperlipidemia    Hypertension    Hypothyroidism    LBBB (left bundle branch block)    Polycythemia vera ([place])    Prediabetes 01/2014   Presence of permanent cardiac pacemaker    Sleep apnea    no CPAP   Past Surgical History:  Procedure Laterality Date   CARDIAC DEFIBRILLATOR PLACEMENT  07/2014   CATARACT EXTRACTION W/ INTRAOCULAR LENS IMPLANT Left    CORONARY ARTERY BYPASS GRAFT  11/2013   lumb laminectomy   [id number]   x 3   LUMBAR FUSION  2013   NASAL SEPTOPLASTY W/ TURBINOPLASTY Bilateral 01/30/2016   Procedure: NASAL SEPTOPLASTY WITH BILATERAL TURBINATE REDUCTION;  Surgeon: Rozetta Nunnery, MD;  Location: [facility];  Service: ENT;  Laterality: Bilateral;   TONSILLECTOMY     UVULECTOMY N/A 01/30/2016   Procedure: Martyn Ehrich;  Surgeon: Rozetta Nunnery, MD;  Location: [facility];  Service: ENT;  Laterality: N/A;     Current Outpatient Medications  Medication Sig Dispense Refill   acetaminophen (TYLENOL) 500 MG tablet Take 500 mg by mouth as needed for mild pain.     allopurinol (ZYLOPRIM) 300 MG tablet TAKE 1 TABLET EVERY DAY TO PREVENT GOUT 90 tablet 3   aspirin EC 81 MG tablet Take 81 mg by mouth daily.     Biotin 10000 MCG TABS Take by mouth.     Cyanocobalamin (B-12) 1000 MCG CAPS Take by mouth.     Docosahexaenoic Acid (DHA OMEGA 3 PO) Take by mouth. 1000 mg     docusate sodium (COLACE) 100 MG capsule Take 100 mg by mouth 2 (two) times daily. 1 tablet in the morning and 1 tablet at bedtime (stool softener)     ezetimibe (ZETIA) 10 MG tablet Take 1 tab daily at night for cholesterol. 90 tablet 3   finasteride (PROSCAR) 5 MG tablet TAKE 1 TABLET EVERY DAY FOR PROSTATE 90 tablet 3   hydroxyurea (HYDREA) 500 MG capsule May take with food to minimize GI side  effects. (Patient taking differently: Take 1,000 mg by mouth daily. May take with food to minimize GI side effects.) 60 capsule 0   isosorbide mononitrate (IMDUR) 30 MG 24 hr tablet Take 1 tablet (30 mg total) by mouth daily. Take by mouth daily. 90 tablet 3   levothyroxine (SYNTHROID) 200 MCG tablet TAKE 1 TAB DAILY ON EMPTY STOMACH WITH ONLY WATER FOR 30 MINS. NO ANTACIDS, CALCIUM OR MAGNESIUM FOR 4 HOURS. AVOID BIOTIN. (Patient taking differently: TAKE 1/2 TAB on Mon, Wed, Friday and take 1 whole tablet on all other days ON EMPTY STOMACH WITH ONLY WATER FOR 30 MINS. NO ANTACIDS, CALCIUM OR MAGNESIUM FOR 4 HOURS. AVOID BIOTIN.) 90  tablet 3   magnesium oxide (MAG-OX) 400 MG tablet Take by mouth.     MELATONIN PO Take 1 tablet by mouth as needed.     metoprolol (TOPROL-XL) 200 MG 24 hr tablet Take 200 mg by mouth daily.     Multiple Vitamin (MULTIVITAMIN) tablet Take 1 tablet by mouth daily. A-Z     omeprazole (PRILOSEC) 20 MG capsule TAKE 1 CAPSULE TWICE DAILY  ( EVERY 12 HOURS  ) TO PREVENT HEARTBURN AND ACID REFLUX 180 capsule 3   Probiotic Product (PROBIOTIC-10 PO) Take by mouth daily.     rosuvastatin (CRESTOR) 40 MG tablet Take 1 tablet (40 mg total) by mouth daily. 90 tablet 3   tamsulosin (FLOMAX) 0.4 MG CAPS capsule Take 1 tablet at Bedtime for Prostate & Bladder 90 capsule 3   torsemide (DEMADEX) 20 MG tablet Take 1 tablet every Morning for Swelling in legs 90 tablet 3   TURMERIC PO Take 1,000 mg by mouth daily.     warfarin (COUMADIN) 5 MG tablet Take 1 tablet (5 mg total) by mouth daily. 90 tablet 0   Zinc 50 MG TABS Take by mouth.     No current facility-administered medications for this visit.    Allergies:   Other   Social History:  The patient  reports that he quit smoking about 39 years ago. His smoking use included cigarettes. He has a 50.00 pack-year smoking history. He has never used smokeless tobacco. He reports that he does not drink alcohol and does not use drugs.   Family History:  The patient's family history includes Alcohol abuse in his son; Alzheimer's disease in his mother; Cancer - Prostate in an other family member; Dementia in his maternal grandmother; Depression in his mother; Heart attack in his son; Heart disease (age of onset: 82) in his father; Hypertension in his father; Mental illness in his mother; Stroke in his maternal grandmother.    ROS:  Please see the history of present illness.   Otherwise, review of systems is positive for none.   All other systems are reviewed and negative.    PHYSICAL EXAM: VS:  BP 110/74   Pulse 70   Ht 5' 7"$  (1.702 m)   Wt 227 lb (103 kg)   SpO2  96%   BMI 35.55 kg/m  , BMI Body mass index is 35.55 kg/m. GEN: Well nourished, well developed, in no acute distress  HEENT: normal  Neck: no JVD, carotid bruits, or masses Cardiac: RRR; no murmurs, rubs, or gallops,no edema  Respiratory:  clear to auscultation bilaterally, normal work of breathing GI: soft, nontender, nondistended, + BS MS: no deformity or atrophy  Skin: warm and dry, device pocket is well healed Neuro:  Strength and sensation are intact Psych: euthymic mood, full affect  EKG:  EKG  is not ordered today. Personal review of the ekg ordered 06/16/21 shows AF, V paced  Device interrogation is reviewed today in detail.  See PaceArt for details.   Recent Labs: 10/16/2021: Magnesium 1.9 04/14/2022: ALT 37; Hemoglobin 14.8; Platelet Count 344 05/28/2022: TSH 2.820 06/11/2022: BUN 29; Creatinine, Ser 1.21; Potassium 5.2; Sodium 134    Lipid Panel     Component Value Date/Time   CHOL 134 10/16/2021 1225   TRIG 113 10/16/2021 1225   HDL 29 (L) 10/16/2021 1225   CHOLHDL 4.6 10/16/2021 1225   VLDL 33 (H) 10/28/2016 1150   LDLCALC 84 10/16/2021 1225     Wt Readings from Last 3 Encounters:  07/07/22 227 lb (103 kg)  06/16/22 222 lb (100.7 kg)  06/11/22 222 lb 8 oz (100.9 kg)      Other studies Reviewed: Additional studies/ records that were reviewed today include: TTE 04/16/22  Review of the above records today demonstrates:  Ejection fraction 55 to 60% Mild LVH No regional wall motion abnormalities Severely dilated left atrium Mild to moderate aortic stenosis Mild to moderate mitral regurgitation   ASSESSMENT AND PLAN:  1.  Permanent atrial fibrillation: Currently on Coumadin.  CHA2DS2-VASc of at least 4.  Well rate controlled.  2.  Chronic systolic heart failure: Currently on optimal medical therapy per primary cardiology.  [place] ICD.  Device functioning appropriately.  Impedance, threshold, sensing without issue.  Continue with current  management.  Cashe Gatt arrange for remote follow-up in device clinic.  3.  Coronary artery disease: Status post CABG.  No current chest pain.  Plan per primary cardiology.  4.  Hypertension: well controlled  5.  Secondary hypercoagulable state: Currently on warfarin for atrial fibrillation   Current medicines are reviewed at length with the patient today.   The patient does not have concerns regarding his medicines.  The following changes were made today:  none  Labs/ tests ordered today include:  No orders of the defined types were placed in this encounter.    Disposition:   FU with Julissa Browning 1 year  Signed, X[place]ara Hulet Meredith Leeds, MD  07/07/2022 2:48 PM     [facility] [address] [place] [address] [phone] (office) [phone] (fax)

## 2022-07-07 NOTE — Patient Instructions (Signed)
Medication Instructions:  Your physician recommends that you continue on your current medications as directed. Please refer to the Current Medication list given to you today.  *If you need a refill on your cardiac medications before your next appointment, please call your pharmacy*   Lab Work: None ordered   Testing/Procedures: None ordered   Follow-Up: At [facility], you and your health needs are our priority.  As part of our continuing mission to provide you with exceptional heart care, we have created designated Provider Care Teams.  These Care Teams include your primary Cardiologist (physician) and Advanced Practice Providers (APPs -  Physician Assistants and Nurse Practitioners) who all work together to provide you with the care you need, when you need it.  We recommend signing up for the patient portal called "MyChart".  Sign up information is provided on this After Visit Summary.  MyChart is used to connect with patients for Virtual Visits (Telemedicine).  Patients are able to view lab/test results, encounter notes, upcoming appointments, etc.  Non-urgent messages can be sent to your provider as well.   To learn more about what you can do with MyChart, go to [email or web address].    Your next appointment:   1 year(s)  The format for your next appointment:   In Person  Provider:   Allegra Lai, MD{   Thank you for choosing [facility]!!   Trinidad Curet, RN [phone]  Other Instructions  Please call the office with your medication list  The office will contact you to arrange home monitoring for your defibrillator

## 2022-07-09 ENCOUNTER — Other Ambulatory Visit: Payer: Self-pay

## 2022-07-09 ENCOUNTER — Telehealth: Payer: Self-pay | Admitting: Family Medicine

## 2022-07-09 ENCOUNTER — Telehealth: Payer: Self-pay | Admitting: Cardiology

## 2022-07-09 ENCOUNTER — Other Ambulatory Visit: Payer: Self-pay | Admitting: Family Medicine

## 2022-07-09 ENCOUNTER — Ambulatory Visit: Payer: Medicare Other

## 2022-07-09 DIAGNOSIS — I480 Paroxysmal atrial fibrillation: Secondary | ICD-10-CM

## 2022-07-09 NOTE — Telephone Encounter (Signed)
New Message:     Patient wants to tell [name] [name] that the list of medicine she gave him, he checked the list and  it was 100%.

## 2022-07-09 NOTE — Telephone Encounter (Signed)
Lab result notes from 1/26, just Donnamae Jude, LPN [id number] AM EST Back to Top    Patient informed of results.   Leeanne Rio, MD 06/13/2022  7:53 AM EST     Please inform pt his INR was elevated again. At this time, it's best we change how he takes his warfarin. He is to go to 11m daily every day. I'd like him to hold his warfarin dose today and tomorrow. Restart on Sunday. Recheck nurse visit in 2 weeks.   Pt has not come in for INR check. He's also been referred to cardiology with [facility] and their last note mentioned him coming off of Coumadin and starting one that doesn't have to have routine monitoring I.e Eliquis or Xarelto?  Please call pt and inquire if he's gotten one of these alternatives? If so, can disregard the INR check that he's pass due for. If he's still on Coumadin, will need to have INR check.

## 2022-07-09 NOTE — Telephone Encounter (Signed)
Pt reports that the [facility] [place] [facility] we have for him is correct. Thanked pt for calling and letting me know.

## 2022-07-10 ENCOUNTER — Encounter: Payer: Medicare Other | Admitting: Internal Medicine

## 2022-07-10 DIAGNOSIS — R7303 Prediabetes: Secondary | ICD-10-CM | POA: Diagnosis not present

## 2022-07-10 DIAGNOSIS — L97522 Non-pressure chronic ulcer of other part of left foot with fat layer exposed: Secondary | ICD-10-CM | POA: Diagnosis not present

## 2022-07-10 LAB — PROTIME-INR
INR: 2.1 — ABNORMAL HIGH (ref 0.9–1.2)
Prothrombin Time: 22 s — ABNORMAL HIGH (ref 9.1–12.0)

## 2022-07-22 ENCOUNTER — Other Ambulatory Visit: Payer: Self-pay | Admitting: Hematology

## 2022-07-22 DIAGNOSIS — D45 Polycythemia vera: Secondary | ICD-10-CM

## 2022-07-23 DIAGNOSIS — I255 Ischemic cardiomyopathy: Secondary | ICD-10-CM | POA: Diagnosis not present

## 2022-07-23 DIAGNOSIS — Z95810 Presence of automatic (implantable) cardiac defibrillator: Secondary | ICD-10-CM | POA: Diagnosis not present

## 2022-07-25 DIAGNOSIS — L97522 Non-pressure chronic ulcer of other part of left foot with fat layer exposed: Secondary | ICD-10-CM | POA: Diagnosis not present

## 2022-07-25 DIAGNOSIS — R7303 Prediabetes: Secondary | ICD-10-CM | POA: Diagnosis not present

## 2022-07-29 DIAGNOSIS — R1013 Epigastric pain: Secondary | ICD-10-CM | POA: Diagnosis not present

## 2022-07-29 DIAGNOSIS — R1319 Other dysphagia: Secondary | ICD-10-CM | POA: Diagnosis not present

## 2022-07-29 DIAGNOSIS — K625 Hemorrhage of anus and rectum: Secondary | ICD-10-CM | POA: Diagnosis not present

## 2022-07-29 DIAGNOSIS — R194 Change in bowel habit: Secondary | ICD-10-CM | POA: Diagnosis not present

## 2022-07-31 ENCOUNTER — Telehealth: Payer: Self-pay | Admitting: Family Medicine

## 2022-07-31 DIAGNOSIS — Z5181 Encounter for therapeutic drug level monitoring: Secondary | ICD-10-CM | POA: Diagnosis not present

## 2022-07-31 DIAGNOSIS — I480 Paroxysmal atrial fibrillation: Secondary | ICD-10-CM

## 2022-07-31 DIAGNOSIS — Z7901 Long term (current) use of anticoagulants: Secondary | ICD-10-CM

## 2022-07-31 NOTE — Telephone Encounter (Signed)
Pt may come in office for lab only for PT/INR check. Lab order given to lab tech

## 2022-07-31 NOTE — Telephone Encounter (Signed)
Patient came in to office to request an INR check at the recommendation of his podiatrist, after he was prescribed a strong antibiotic. Please advise. [name] [name]

## 2022-08-01 LAB — PROTIME-INR
INR: 2.1 — ABNORMAL HIGH (ref 0.9–1.2)
Prothrombin Time: 21.8 s — ABNORMAL HIGH (ref 9.1–12.0)

## 2022-08-04 ENCOUNTER — Other Ambulatory Visit: Payer: Self-pay

## 2022-08-04 DIAGNOSIS — D45 Polycythemia vera: Secondary | ICD-10-CM

## 2022-08-05 ENCOUNTER — Inpatient Hospital Stay: Payer: Medicare Other | Admitting: Hematology

## 2022-08-05 ENCOUNTER — Inpatient Hospital Stay: Payer: Medicare Other

## 2022-08-05 ENCOUNTER — Telehealth: Payer: Self-pay

## 2022-08-05 ENCOUNTER — Telehealth: Payer: Self-pay | Admitting: Hematology

## 2022-08-05 NOTE — Telephone Encounter (Signed)
Spoke with patient to advise him of his results and he stated he was already at a [facility] facility and already got his results

## 2022-08-05 NOTE — Telephone Encounter (Signed)
-----   Message from Leeanne Rio, MD sent at 08/01/2022  7:53 AM EDT ----- Please inform pt his INR is at goal. Continue coumadin as prescribed to take 5mg  daily

## 2022-08-05 NOTE — Telephone Encounter (Signed)
Per 3/18 ib reached out to reschedule patient.

## 2022-08-12 ENCOUNTER — Other Ambulatory Visit: Payer: Self-pay | Admitting: Family Medicine

## 2022-08-12 ENCOUNTER — Telehealth: Payer: Self-pay | Admitting: Family Medicine

## 2022-08-12 DIAGNOSIS — I480 Paroxysmal atrial fibrillation: Secondary | ICD-10-CM

## 2022-08-12 DIAGNOSIS — Z5181 Encounter for therapeutic drug level monitoring: Secondary | ICD-10-CM

## 2022-08-12 MED ORDER — ENOXAPARIN SODIUM 150 MG/ML IJ SOSY: PREFILLED_SYRINGE | INTRAMUSCULAR | 0 refills | Status: DC

## 2022-08-12 NOTE — Telephone Encounter (Signed)
Pt is to have EGD and Colonoscopy on 4/9 for dysphagia. He is on coumadin daily. Discussed case with cardiologist and recommended bridging with Lovenox. Discussed with pt and will have wife do. Have advised pt on following instructions: To stop coumadin 5 days before procedure, 4/4 with 4/3 dose being last dose. To start.Lovenox 1.5mg /kg/day on 4/6-08/25/22. Start back Lovenox and Coumadin on 4/10-4/12. Return for INR check on 4/15.

## 2022-08-12 NOTE — Telephone Encounter (Signed)
Patient scheduled 04/15 at [facility], per provider instruction. [name] [name]

## 2022-08-13 ENCOUNTER — Other Ambulatory Visit: Payer: Medicare Other

## 2022-08-13 ENCOUNTER — Ambulatory Visit: Payer: Medicare Other | Admitting: Hematology

## 2022-08-13 DIAGNOSIS — L97522 Non-pressure chronic ulcer of other part of left foot with fat layer exposed: Secondary | ICD-10-CM | POA: Diagnosis not present

## 2022-08-13 DIAGNOSIS — R7303 Prediabetes: Secondary | ICD-10-CM | POA: Diagnosis not present

## 2022-08-20 ENCOUNTER — Ambulatory Visit (INDEPENDENT_AMBULATORY_CARE_PROVIDER_SITE_OTHER): Payer: Medicare Other | Admitting: Family Medicine

## 2022-08-20 ENCOUNTER — Encounter: Payer: Self-pay | Admitting: Family Medicine

## 2022-08-20 VITALS — BP 131/78 | HR 70 | Temp 98.0°F | Ht 67.0 in | Wt 223.7 lb

## 2022-08-20 DIAGNOSIS — J029 Acute pharyngitis, unspecified: Secondary | ICD-10-CM

## 2022-08-20 LAB — POCT RAPID STREP A (OFFICE): Rapid Strep A Screen: NEGATIVE

## 2022-08-20 NOTE — Patient Instructions (Signed)
The rapid strep test performed was negative. It is likely that your sore throat is caused by a virus. Warm fluids such as tea/honey and warm salt water gargles may help with the discomfort.  Symptomatic and supportive therapy such as OTC topical throat spray such as chloraseptic spray or throat lozenges are recommended. Acetaminophen or ibuprofen according to package instructions for pain. Adequate hydration and rest.   

## 2022-08-20 NOTE — Progress Notes (Signed)
Established Patient Office Visit  Subjective   Patient ID: [name] [name], male    DOB: 1940/11/06  Age: 82 y.o. MRN: [id number]  Chief Complaint  Patient presents with   Sore Throat    Started 3 days ago. He states that symptoms have come and gone, and restarted yesterday. He denies fever, headache or chills. He has not taken anything for his symptoms.     HPI Presents today for an acute visit with complaint of sore throat that comes and goes. He is scheduled for colonoscopy and upper endo next week and wants to make sure this isn't something serious.  Symptoms have been present  for 3 days off and on Associated symptoms include: none Pertinent negatives: no fever or chills, no headaches, no nasal congestion Pain severity: 0/10 currently Treatments tried include : nothing Treatment effective : n/a Sick contacts : n/a    Review of Systems  Constitutional:  Negative for chills and fever.  HENT:  Positive for sore throat ("a little sore throat"). Negative for congestion and ear pain.   Respiratory:  Negative for shortness of breath.   Cardiovascular:  Negative for chest pain.  Neurological:  Negative for headaches.      Objective:     BP 131/78   Pulse 70   Temp 98 F (36.7 C) (Oral)   Ht 5\' 7"  (1.702 m)   Wt 223 lb 11.2 oz (101.5 kg)   SpO2 94%   BMI 35.04 kg/m  BP Readings from Last 3 Encounters:  08/20/22 131/78  07/07/22 110/74  06/16/22 115/79      Physical Exam Vitals and nursing note reviewed.  Constitutional:      General: He is not in acute distress.    Appearance: He is well-developed. He is not ill-appearing.  HENT:     Right Ear: Tympanic membrane normal.     Left Ear: Tympanic membrane normal.     Nose: Nose normal.     Right Turbinates: Not swollen.     Left Turbinates: Not swollen.     Right Sinus: No maxillary sinus tenderness or frontal sinus tenderness.     Left Sinus: No maxillary sinus tenderness or frontal sinus tenderness.      Mouth/Throat:     Mouth: Mucous membranes are moist.     Pharynx: Posterior oropharyngeal erythema present. No oropharyngeal exudate.  Cardiovascular:     Rate and Rhythm: Regular rhythm.  Pulmonary:     Effort: Pulmonary effort is normal.     Breath sounds: Normal breath sounds.  Lymphadenopathy:     Cervical:     Right cervical: No superficial cervical adenopathy.    Left cervical: No superficial cervical adenopathy.  Skin:    General: Skin is warm and dry.     Capillary Refill: Capillary refill takes less than 2 seconds.  Neurological:     General: No focal deficit present.     Mental Status: He is alert. Mental status is at baseline.  Psychiatric:        Mood and Affect: Mood normal.        Behavior: Behavior normal.        Thought Content: Thought content normal.        Judgment: Judgment normal.     No results found for any visits on 08/20/22.    The ASCVD Risk score (Arnett DK, et al., 2019) failed to calculate for the following reasons:   The 2019 ASCVD risk score is only valid for  ages 38 to 61    Assessment & Plan:   Problem List Items Addressed This Visit   None Visit Diagnoses     Pharyngitis, unspecified etiology    -  Primary   Sore throat      Sore throat off and on for 3 days. Rapid strep negative in office. Recommend supportive therapy such as warm salt water gargles. Denies fever, chills, and nasal congestion. He will notify GI specialist to inform of sore throat if symptoms do not resolve.    Relevant Orders   POCT rapid strep A Negative.      Agrees with plan of care discussed.  Questions answered.   Return if symptoms worsen or fail to improve.    Chalmers Guest, FNP

## 2022-08-26 DIAGNOSIS — I2510 Atherosclerotic heart disease of native coronary artery without angina pectoris: Secondary | ICD-10-CM | POA: Diagnosis not present

## 2022-08-26 DIAGNOSIS — I5022 Chronic systolic (congestive) heart failure: Secondary | ICD-10-CM | POA: Diagnosis not present

## 2022-08-26 DIAGNOSIS — I447 Left bundle-branch block, unspecified: Secondary | ICD-10-CM | POA: Diagnosis not present

## 2022-08-26 DIAGNOSIS — Z885 Allergy status to narcotic agent status: Secondary | ICD-10-CM | POA: Diagnosis not present

## 2022-08-26 DIAGNOSIS — Z950 Presence of cardiac pacemaker: Secondary | ICD-10-CM | POA: Diagnosis not present

## 2022-08-26 DIAGNOSIS — Z951 Presence of aortocoronary bypass graft: Secondary | ICD-10-CM | POA: Diagnosis not present

## 2022-08-26 DIAGNOSIS — Z1211 Encounter for screening for malignant neoplasm of colon: Secondary | ICD-10-CM | POA: Insufficient documentation

## 2022-08-26 DIAGNOSIS — Z87891 Personal history of nicotine dependence: Secondary | ICD-10-CM | POA: Diagnosis not present

## 2022-08-26 DIAGNOSIS — R194 Change in bowel habit: Secondary | ICD-10-CM | POA: Diagnosis not present

## 2022-08-26 DIAGNOSIS — Z7901 Long term (current) use of anticoagulants: Secondary | ICD-10-CM | POA: Diagnosis not present

## 2022-08-26 DIAGNOSIS — Z79899 Other long term (current) drug therapy: Secondary | ICD-10-CM | POA: Diagnosis not present

## 2022-08-26 DIAGNOSIS — R1310 Dysphagia, unspecified: Secondary | ICD-10-CM | POA: Diagnosis not present

## 2022-08-26 DIAGNOSIS — I110 Hypertensive heart disease with heart failure: Secondary | ICD-10-CM | POA: Diagnosis not present

## 2022-08-26 DIAGNOSIS — R195 Other fecal abnormalities: Secondary | ICD-10-CM | POA: Diagnosis not present

## 2022-08-26 DIAGNOSIS — Z79890 Hormone replacement therapy (postmenopausal): Secondary | ICD-10-CM | POA: Diagnosis not present

## 2022-08-26 DIAGNOSIS — I480 Paroxysmal atrial fibrillation: Secondary | ICD-10-CM | POA: Diagnosis not present

## 2022-08-26 DIAGNOSIS — I255 Ischemic cardiomyopathy: Secondary | ICD-10-CM | POA: Diagnosis not present

## 2022-08-26 DIAGNOSIS — E039 Hypothyroidism, unspecified: Secondary | ICD-10-CM | POA: Diagnosis not present

## 2022-08-26 DIAGNOSIS — E782 Mixed hyperlipidemia: Secondary | ICD-10-CM | POA: Diagnosis not present

## 2022-08-26 DIAGNOSIS — Z7982 Long term (current) use of aspirin: Secondary | ICD-10-CM | POA: Diagnosis not present

## 2022-08-26 DIAGNOSIS — G4733 Obstructive sleep apnea (adult) (pediatric): Secondary | ICD-10-CM | POA: Diagnosis not present

## 2022-08-26 DIAGNOSIS — K219 Gastro-esophageal reflux disease without esophagitis: Secondary | ICD-10-CM | POA: Diagnosis not present

## 2022-08-26 DIAGNOSIS — R1319 Other dysphagia: Secondary | ICD-10-CM | POA: Diagnosis not present

## 2022-08-26 HISTORY — PX: ESOPHAGOGASTRODUODENOSCOPY: SHX1529

## 2022-08-26 HISTORY — PX: COLONOSCOPY: SHX174

## 2022-08-27 ENCOUNTER — Ambulatory Visit (INDEPENDENT_AMBULATORY_CARE_PROVIDER_SITE_OTHER): Payer: Medicare Other

## 2022-08-27 ENCOUNTER — Encounter: Payer: Self-pay | Admitting: Emergency Medicine

## 2022-08-27 ENCOUNTER — Ambulatory Visit
Admission: EM | Admit: 2022-08-27 | Discharge: 2022-08-27 | Disposition: A | Discharge status: Home / Self Care | Payer: Medicare Other | Attending: Family Medicine | Admitting: Family Medicine

## 2022-08-27 DIAGNOSIS — M25521 Pain in right elbow: Secondary | ICD-10-CM

## 2022-08-27 MED ORDER — PREDNISONE 10 MG (21) PO TBPK: ORAL_TABLET | Freq: Every day | ORAL | 0 refills | Status: DC

## 2022-08-27 NOTE — ED Triage Notes (Addendum)
Pt here for right arm (elbow) pain since yesterday after his colonoscopy yesterday. He is unsure if he laid on it wrong during procedure or slept on it wrong. He is wearing a sling from home.

## 2022-08-27 NOTE — ED Provider Notes (Signed)
[facility]    CSN: [id number] Arrival date & time: 08/27/22  1442      History   Chief Complaint Chief Complaint  Patient presents with   Arm Pain    HPI [name] [name] is a 82 y.o. male.   HPI 82 year old male presents with right elbow pain since yesterday.  Reports he had a colonoscopy performed yesterday and may have laid on it during the procedure the wrong way.  Patient is currently wearing an elbow sling from home for comfort. PMH CAD s/p CABG, CHF, s/p pacemaker placement.  COPD, and A-fib.  Patient is currently on Coumadin and Lovenox and denies any unusual bleeding.  Patient is accompanied by his wife this afternoon.  Past Medical History:  Diagnosis Date   A-fib 01/2014   AICD (automatic cardioverter/defibrillator) present    [employer]   Anal fissure 11/10/2019   Arthritis    ASHD (arteriosclerotic heart disease) 2015   s/p CABG   Cataract    s/p left   CHF (congestive heart failure)    COPD (chronic obstructive pulmonary disease)    by CXR, pt unsure of this   Diverticulosis    GERD (gastroesophageal reflux disease)    Gout 2014   Hyperlipidemia    Hypertension    Hypothyroidism    LBBB (left bundle branch block)    Polycythemia vera    Prediabetes 01/2014   Presence of permanent cardiac pacemaker    Sleep apnea    no CPAP    Patient Active Problem List   Diagnosis Date Noted   Zenker's diverticulum 10/16/2021   Loss of appetite 10/10/2021   Loss of weight 10/10/2021   Generalized abdominal pain 10/10/2021   Thrombosed external hemorrhoid 10/10/2021   Dysphagia 08/01/2021   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   Intolerance of continuous positive airway pressure (CPAP) ventilation 05/02/2021   MPN (myeloproliferative neoplasm) 08/27/2020   JAK2 V617F mutation 08/27/2020   Rectal pain 11/10/2019   Constipation 11/10/2019   Abnormal glucose 10/12/2019   Memory changes 01/10/2019   Polycythemia vera 10/21/2018    Thrombocytosis 12/29/2017   Senile purpura 09/01/2017   Emphysema of lung 06/01/2017   Tortuous aorta (HCC)- CXR 2018 06/01/2017   Regular astigmatism, right eye 10/16/2016   Combined forms of age-related cataract of right eye 10/16/2016   Trigger ring finger of left hand 08/06/2016   Bilateral carpal tunnel syndrome 08/06/2016   Ischemic cardiomyopathy 06/22/2015   ASHD/CABG x 3V (11/2013) 02/06/2015   Acquired thrombophilia (HCC) 02/06/2015   Cardiac pacemaker/AICD (01/2014) 02/06/2015   Morbid obesity 01/03/2015   HTN (hypertension) 01/02/2015   Hyperlipidemia, mixed 01/02/2015   Vitamin D deficiency 01/02/2015   GERD  01/02/2015   BPH (benign prostatic hyperplasia) 01/02/2015   Medication management 01/02/2015   Atrial fibrillation 01/02/2015   Hypothyroidism 01/02/2015   OSA and COPD overlap syndrome 01/02/2015   Chronic systolic (congestive) heart failure 02/17/2014   S/P CABG x 3 02/13/2014   Atherosclerotic heart disease of native coronary artery without angina pectoris 02/08/2014   Gout 02/08/2014   Pulmonary hypertension due to left heart disease 02/06/2014    Past Surgical History:  Procedure Laterality Date   CARDIAC DEFIBRILLATOR PLACEMENT  07/2014   CATARACT EXTRACTION W/ INTRAOCULAR LENS IMPLANT Left    CORONARY ARTERY BYPASS GRAFT  11/2013   lumb laminectomy  [id number],[id number]   x 3   LUMBAR FUSION  2013   NASAL SEPTOPLASTY W/ TURBINOPLASTY Bilateral 01/30/2016   Procedure: NASAL  SEPTOPLASTY WITH BILATERAL TURBINATE REDUCTION;  Surgeon: Drema Halon, MD;  Location: [facility] OR;  Service: ENT;  Laterality: Bilateral;   TONSILLECTOMY     UVULECTOMY N/A 01/30/2016   Procedure: Alfredo Martinez;  Surgeon: Drema Halon, MD;  Location: [facility] OR;  Service: ENT;  Laterality: N/A;       Home Medications    Prior to Admission medications   Medication Sig Start Date End Date Taking? Authorizing Provider  predniSONE (STERAPRED UNI-PAK 21 TAB) 10 MG (21) TBPK tablet Take  by mouth daily. Take 6 tabs by mouth daily  for 2 days, then 5 tabs for 2 days, then 4 tabs for 2 days, then 3 tabs for 2 days, 2 tabs for 2 days, then 1 tab by mouth daily for 2 days 08/27/22  Yes Trevor Iha, FNP  acetaminophen (TYLENOL) 500 MG tablet Take 500 mg by mouth as needed for mild pain.    [provider]  allopurinol (ZYLOPRIM) 300 MG tablet TAKE 1 TABLET EVERY DAY TO PREVENT GOUT 04/30/22   Suzan Slick, MD  aspirin EC 81 MG tablet Take 81 mg by mouth daily.    [provider]  Biotin 09811 MCG TABS Take by mouth.    [provider]  Cyanocobalamin (B-12) 1000 MCG CAPS Take by mouth.    [provider]  Docosahexaenoic Acid (DHA OMEGA 3 PO) Take by mouth. 1000 mg    [provider]  docusate sodium (COLACE) 100 MG capsule Take 100 mg by mouth 2 (two) times daily. 1 tablet in the morning and 1 tablet at bedtime (stool softener)    [provider]  enoxaparin (LOVENOX) 150 MG/ML injection Take 1 ml [place] daily on on 4/6, 4/7, 4/8 alone without coumadin.  Start back on 10-12th and take with Coumadin dose on these 3 days 08/23/22   Suzan Slick, MD  ezetimibe (ZETIA) 10 MG tablet Take 1 tab daily at night for cholesterol. 10/17/21   Judd Gaudier, NP  finasteride (PROSCAR) 5 MG tablet TAKE 1 TABLET EVERY DAY FOR PROSTATE 09/16/21   Lucky Cowboy, MD  hydroxyurea (HYDREA) 500 MG capsule TAKE 2 CAPSULES ONE TIME DAILY WITH FOOD 07/22/22   Johney Maine, MD  isosorbide mononitrate (IMDUR) 30 MG 24 hr tablet Take 1 tablet (30 mg total) by mouth daily. Take by mouth daily. 06/05/22   Suzan Slick, MD  levothyroxine (SYNTHROID) 200 MCG tablet TAKE 1 TAB DAILY ON EMPTY STOMACH WITH ONLY WATER FOR 30 MINS. NO ANTACIDS, CALCIUM OR MAGNESIUM FOR 4 HOURS. AVOID BIOTIN. Patient taking differently: TAKE 1/2 TAB on Mon, Wed, Friday and take 1 whole tablet on all other days ON EMPTY STOMACH WITH ONLY WATER FOR 30 MINS. NO ANTACIDS,  CALCIUM OR MAGNESIUM FOR 4 HOURS. AVOID BIOTIN. 04/27/21   Lucky Cowboy, MD  magnesium oxide (MAG-OX) 400 MG tablet Take by mouth.    [provider]  MELATONIN PO Take 1 tablet by mouth as needed.    [provider]  metoprolol (TOPROL-XL) 200 MG 24 hr tablet Take 200 mg by mouth daily.    [provider]  Multiple Vitamin (MULTIVITAMIN) tablet Take 1 tablet by mouth daily. A-Z    [provider]  omeprazole (PRILOSEC) 20 MG capsule TAKE 1 CAPSULE TWICE DAILY  ( EVERY 12 HOURS  ) TO PREVENT HEARTBURN AND ACID REFLUX 12/17/21   Lucky Cowboy, MD  Probiotic Product (PROBIOTIC-10 PO) Take by mouth daily.    [provider]  rosuvastatin (CRESTOR) 40 MG tablet Take 1 tablet (40 mg total) by mouth daily. 06/05/22   Suzan Slick, MD  tamsulosin (FLOMAX) 0.4 MG CAPS capsule Take 1 tablet at Bedtime for Prostate & Bladder 04/30/22   Suzan Slick, MD  torsemide (DEMADEX) 20 MG tablet Take 1 tablet every Morning for Swelling in legs 06/05/22   Suzan Slick, MD  TURMERIC PO Take 1,000 mg by mouth daily.    [provider]  warfarin (COUMADIN) 5 MG tablet Take 1 tablet (5 mg total) by mouth daily. 06/13/22 09/11/22  Suzan Slick, MD  Zinc 50 MG TABS Take by mouth.    [provider]    Family History Family History  Problem Relation Age of Onset   Alzheimer's disease Mother    Mental illness Mother    Depression Mother    Heart disease Father 6       had 5 MI's & died 52 yo   Hypertension Father    Alcohol abuse Son    Heart attack Son    Stroke Maternal Grandmother    Dementia Maternal Grandmother    Cancer - Prostate Other    Colon cancer Neg Hx    Stomach cancer Neg Hx    Esophageal cancer Neg Hx     Social History Social History   Tobacco Use   Smoking status: Former    Packs/day: 2.00    Years: 25.00    Additional pack years: 0.00    Total pack years: 50.00    Types: Cigarettes    Quit date:  05/20/1983    Years since quitting: 39.2   Smokeless tobacco: Never  Vaping Use   Vaping Use: Never used  Substance Use Topics   Alcohol use: Never   Drug use: Never     Allergies   Other   Review of Systems Review of Systems  Musculoskeletal:        Left elbow pain x 1 day     Physical Exam Triage Vital Signs ED Triage Vitals  Enc Vitals Group     BP      Pulse      Resp      Temp      Temp src      SpO2      Weight      Height      Head Circumference      Peak Flow      Pain Score      Pain Loc      Pain Edu?      Excl. in GC?    No data found.  Updated Vital Signs BP (!) 170/75 (BP Location: Left Arm)   Pulse 82   Temp 98.1 F (36.7 C) (Oral)   Resp 16   SpO2 97%      Physical Exam Vitals and nursing note reviewed.  Constitutional:      Appearance: Normal appearance. He is normal weight.  HENT:     Head: Normocephalic and atraumatic.     Mouth/Throat:     Mouth: Mucous membranes are moist.     Pharynx: Oropharynx is clear.  Eyes:     Extraocular Movements: Extraocular movements intact.     Conjunctiva/sclera: Conjunctivae normal.     Pupils: Pupils are equal, round, and reactive to light.  Cardiovascular:     Rate and Rhythm: Normal rate and regular rhythm.     Pulses: Normal pulses.  Heart sounds: Normal heart sounds.  Pulmonary:     Effort: Pulmonary effort is normal.     Breath sounds: Normal breath sounds. No wheezing, rhonchi or rales.  Musculoskeletal:        General: Normal range of motion.     Cervical back: Normal range of motion and neck supple.     Comments: Right elbow: Moderate soft tissue swelling and TTP over lateral olecranon, patient is currently wearing right elbow sling  Skin:    General: Skin is warm and dry.  Neurological:     General: No focal deficit present.     Mental Status: He is alert and oriented to person, place, and time. Mental status is at baseline.      UC Treatments / Results  Labs (all labs  ordered are listed, but only abnormal results are displayed) Labs Reviewed - No data to display  EKG   Radiology DG Elbow Complete Right  Result Date: 08/27/2022 CLINICAL DATA:  RIGHT elbow pain for 1 day. EXAM: RIGHT ELBOW - COMPLETE 3+ VIEW COMPARISON:  None Available. FINDINGS: An elbow effusion is noted. There is no evidence of acute fracture, subluxation or dislocation. Moderate degenerative changes at the humeral ulnar joint noted. No focal bony lesions are present. IMPRESSION: 1. Elbow effusion without acute bony abnormality. 2. Moderate degenerative changes at the humeral ulnar joint. Electronically Signed   By: Harmon PierJeffrey  Hu M.D.   On: 08/27/2022 15:38    Procedures Procedures (including critical care time)  Medications Ordered in UC Medications - No data to display  Initial Impression / Assessment and Plan / UC Course  I have reviewed the triage vital signs and the nursing notes.  Pertinent labs & imaging results that were available during my care of the patient were reviewed by me and considered in my medical decision making (see chart for details).     MDM: 1.  Right elbow pain-Rx'd Sterapred Unipak (tapering from 60 mg to 10 mg over 10 days. Advised patient/spouse to take medication as directed with food to completion. Encouraged to increase daily water intake to 64 ounces per day while taking this medication.  Advised if symptoms worsen and/or unresolved [facility] for [facility].  Patient discharged home, hemodynamically stable. Final Clinical Impressions(s) / UC Diagnoses   Final diagnoses:  Right elbow pain     Discharge Instructions      Advised patient/spouse to take medication as directed with food to completion. Encouraged to increase daily water intake to 64 ounces per day while taking this medication.  Advised if symptoms worsen and/or unresolved [facility] for further  evaluation.     ED Prescriptions     Medication Sig Dispense Auth. Provider   predniSONE (STERAPRED UNI-PAK 21 TAB) 10 MG (21) TBPK tablet Take by mouth daily. Take 6 tabs by mouth daily  for 2 days, then 5 tabs for 2 days, then 4 tabs for 2 days, then 3 tabs for 2 days, 2 tabs for 2 days, then 1 tab by mouth daily for 2 days 42 tablet Trevor Ihaagan, Dynasty Holquin, FNP      PDMP not reviewed this encounter.   Trevor IhaRagan, Chijioke Lasser, FNP 08/27/22 1628

## 2022-08-27 NOTE — Discharge Instructions (Addendum)
Advised patient/spouse to take medication as directed with food to completion. Encouraged to increase daily water intake to 64 ounces per day while taking this medication.  Advised if symptoms worsen and/or unresolved please follow-up with [facility] orthopedic provider for further evaluation.

## 2022-08-29 DIAGNOSIS — L97522 Non-pressure chronic ulcer of other part of left foot with fat layer exposed: Secondary | ICD-10-CM | POA: Diagnosis not present

## 2022-09-01 ENCOUNTER — Ambulatory Visit (INDEPENDENT_AMBULATORY_CARE_PROVIDER_SITE_OTHER): Payer: Medicare Other | Admitting: Family Medicine

## 2022-09-01 ENCOUNTER — Encounter: Payer: Self-pay | Admitting: Family Medicine

## 2022-09-01 VITALS — BP 98/57 | HR 71 | Temp 97.5°F | Resp 18 | Ht 67.0 in | Wt 228.4 lb

## 2022-09-01 DIAGNOSIS — Z5181 Encounter for therapeutic drug level monitoring: Secondary | ICD-10-CM | POA: Diagnosis not present

## 2022-09-01 DIAGNOSIS — I480 Paroxysmal atrial fibrillation: Secondary | ICD-10-CM

## 2022-09-01 DIAGNOSIS — Z7901 Long term (current) use of anticoagulants: Secondary | ICD-10-CM | POA: Diagnosis not present

## 2022-09-01 NOTE — Progress Notes (Signed)
Established Patient Office Visit  Subjective   Patient ID: [name] [name], male    DOB: [birth year]  Age: 82 y.o. MRN: [id number]  Chief Complaint  Patient presents with   Follow-up    Patient is here for a follow up after procedure (endoscopy), He was on Lovenox and today is here to also get lab work for PT INR    HPI  Atrial fibrillation Pt is here for follow up of PT/INR. He is on coumadin. He needed a EGD and colonoscopy due to dysphagia and abnormal bowel movements. He had esophageal dilation an normal colonoscopy. He had to be bridged with Lovenox due to coming off of Coumadin before the EGD. He is here for recheck.  He also reports right arm pain for years. He reports after the EGD, he got home and the next day, the right arm was in pain. He went to ER and imaging was done. Showed elbow effusion. He was given prednisone and it helped.   Review of Systems  Musculoskeletal:  Positive for joint pain.  All other systems reviewed and are negative.    Objective:     BP (!) 98/57   Pulse 71   Temp (!) 97.5 F (36.4 C) (Oral)   Resp 18   Ht 5\' 7"  (1.702 m)   Wt 228 lb 6.4 oz (103.6 kg)   SpO2 96%   BMI 35.77 kg/m    Physical Exam Vitals and nursing note reviewed.  Constitutional:      Appearance: Normal appearance. He is obese.  HENT:     Head: Normocephalic and atraumatic.     Right Ear: External ear normal.     Left Ear: External ear normal.     Mouth/Throat:     Mouth: Mucous membranes are moist.  Eyes:     Pupils: Pupils are equal, round, and reactive to light.  Cardiovascular:     Rate and Rhythm: Rhythm irregular.     Pulses: Normal pulses.     Heart sounds: Normal heart sounds.  Pulmonary:     Effort: Pulmonary effort is normal.     Breath sounds: Normal breath sounds.  Abdominal:     General: Abdomen is flat.  Skin:    General: Skin is warm.     Capillary Refill: Capillary refill takes less than 2 seconds.  Neurological:     General: No focal  deficit present.     Mental Status: He is alert and oriented to person, place, and time. Mental status is at baseline.  Psychiatric:        Mood and Affect: Mood normal.        Behavior: Behavior normal.        Thought Content: Thought content normal.        Judgment: Judgment normal.    No results found for any visits on 09/01/22.    The ASCVD Risk score (Arnett DK, et al., 2019) failed to calculate for the following reasons:   The 2019 ASCVD risk score is only valid for ages 57 to 50    Assessment & Plan:   Problem List Items Addressed This Visit       Cardiovascular and Mediastinum   Atrial fibrillation - Primary   Other Visit Diagnoses     Anticoagulation goal of INR 2 to 3          Paroxysmal atrial fibrillation -     Protime-INR  Anticoagulation goal of INR 2 to 3 -  Protime-INR   Since coming off Lovenox after bridge treatment, will check INR today and adjust Coumadin if need be.  Follow up in 3 months pending results.  No follow-ups on file.    Suzan Slick, MD

## 2022-09-02 ENCOUNTER — Other Ambulatory Visit: Payer: Self-pay | Admitting: Family Medicine

## 2022-09-02 ENCOUNTER — Telehealth: Payer: Self-pay | Admitting: Family Medicine

## 2022-09-02 DIAGNOSIS — Z7901 Long term (current) use of anticoagulants: Secondary | ICD-10-CM

## 2022-09-02 DIAGNOSIS — I480 Paroxysmal atrial fibrillation: Secondary | ICD-10-CM

## 2022-09-02 LAB — PROTIME-INR
INR: 2.5 — ABNORMAL HIGH (ref 0.9–1.2)
Prothrombin Time: 25.6 s — ABNORMAL HIGH (ref 9.1–12.0)

## 2022-09-02 NOTE — Telephone Encounter (Signed)
Please inform pt his INR results are normal and at 2.5 which is perfect.  Continue taking coumadin 5mg  daily as prescribed.  Should repeat this every 4 weeks labs only. Will need to return around May 15th

## 2022-09-02 NOTE — Telephone Encounter (Signed)
Called and spoke with patient and advised him of his INR and he gave a verbal understanding

## 2022-09-10 ENCOUNTER — Ambulatory Visit: Payer: Medicare Other | Admitting: Family Medicine

## 2022-09-12 DIAGNOSIS — R7303 Prediabetes: Secondary | ICD-10-CM | POA: Diagnosis not present

## 2022-09-12 DIAGNOSIS — L97522 Non-pressure chronic ulcer of other part of left foot with fat layer exposed: Secondary | ICD-10-CM | POA: Diagnosis not present

## 2022-09-16 ENCOUNTER — Inpatient Hospital Stay (HOSPITAL_COMMUNITY): Payer: Medicare Other

## 2022-09-16 ENCOUNTER — Inpatient Hospital Stay: Payer: Medicare Other

## 2022-09-16 ENCOUNTER — Encounter (HOSPITAL_COMMUNITY): Payer: Self-pay | Admitting: Emergency Medicine

## 2022-09-16 ENCOUNTER — Emergency Department (HOSPITAL_COMMUNITY): Payer: Medicare Other

## 2022-09-16 ENCOUNTER — Inpatient Hospital Stay (HOSPITAL_BASED_OUTPATIENT_CLINIC_OR_DEPARTMENT_OTHER): Payer: Medicare Other | Admitting: Hematology

## 2022-09-16 ENCOUNTER — Inpatient Hospital Stay (HOSPITAL_COMMUNITY)
Admission: EM | Admit: 2022-09-16 | Discharge: 2022-09-18 | DRG: 065 | Disposition: A | Discharge status: Home Health | Payer: Medicare Other | Attending: Internal Medicine | Admitting: Internal Medicine

## 2022-09-16 ENCOUNTER — Other Ambulatory Visit: Payer: Self-pay

## 2022-09-16 VITALS — BP 108/72 | HR 85 | Temp 97.8°F | Resp 18 | Wt 220.7 lb

## 2022-09-16 DIAGNOSIS — M1990 Unspecified osteoarthritis, unspecified site: Secondary | ICD-10-CM | POA: Diagnosis present

## 2022-09-16 DIAGNOSIS — I272 Pulmonary hypertension, unspecified: Secondary | ICD-10-CM | POA: Diagnosis present

## 2022-09-16 DIAGNOSIS — M109 Gout, unspecified: Secondary | ICD-10-CM | POA: Diagnosis present

## 2022-09-16 DIAGNOSIS — I5032 Chronic diastolic (congestive) heart failure: Secondary | ICD-10-CM | POA: Diagnosis present

## 2022-09-16 DIAGNOSIS — Z818 Family history of other mental and behavioral disorders: Secondary | ICD-10-CM

## 2022-09-16 DIAGNOSIS — Z87891 Personal history of nicotine dependence: Secondary | ICD-10-CM | POA: Insufficient documentation

## 2022-09-16 DIAGNOSIS — R4182 Altered mental status, unspecified: Secondary | ICD-10-CM

## 2022-09-16 DIAGNOSIS — E669 Obesity, unspecified: Secondary | ICD-10-CM | POA: Diagnosis present

## 2022-09-16 DIAGNOSIS — K219 Gastro-esophageal reflux disease without esophagitis: Secondary | ICD-10-CM | POA: Diagnosis not present

## 2022-09-16 DIAGNOSIS — J449 Chronic obstructive pulmonary disease, unspecified: Secondary | ICD-10-CM | POA: Diagnosis present

## 2022-09-16 DIAGNOSIS — Z823 Family history of stroke: Secondary | ICD-10-CM | POA: Diagnosis not present

## 2022-09-16 DIAGNOSIS — I255 Ischemic cardiomyopathy: Secondary | ICD-10-CM | POA: Diagnosis present

## 2022-09-16 DIAGNOSIS — I482 Chronic atrial fibrillation, unspecified: Secondary | ICD-10-CM | POA: Diagnosis present

## 2022-09-16 DIAGNOSIS — I4891 Unspecified atrial fibrillation: Secondary | ICD-10-CM | POA: Diagnosis present

## 2022-09-16 DIAGNOSIS — Z7901 Long term (current) use of anticoagulants: Secondary | ICD-10-CM

## 2022-09-16 DIAGNOSIS — R29701 NIHSS score 1: Secondary | ICD-10-CM | POA: Diagnosis present

## 2022-09-16 DIAGNOSIS — I480 Paroxysmal atrial fibrillation: Secondary | ICD-10-CM | POA: Diagnosis not present

## 2022-09-16 DIAGNOSIS — R270 Ataxia, unspecified: Secondary | ICD-10-CM | POA: Diagnosis present

## 2022-09-16 DIAGNOSIS — I2510 Atherosclerotic heart disease of native coronary artery without angina pectoris: Secondary | ICD-10-CM | POA: Diagnosis present

## 2022-09-16 DIAGNOSIS — N400 Enlarged prostate without lower urinary tract symptoms: Secondary | ICD-10-CM | POA: Diagnosis present

## 2022-09-16 DIAGNOSIS — R410 Disorientation, unspecified: Secondary | ICD-10-CM | POA: Diagnosis not present

## 2022-09-16 DIAGNOSIS — G9349 Other encephalopathy: Secondary | ICD-10-CM | POA: Diagnosis present

## 2022-09-16 DIAGNOSIS — Z8249 Family history of ischemic heart disease and other diseases of the circulatory system: Secondary | ICD-10-CM | POA: Diagnosis not present

## 2022-09-16 DIAGNOSIS — I10 Essential (primary) hypertension: Secondary | ICD-10-CM | POA: Diagnosis not present

## 2022-09-16 DIAGNOSIS — D45 Polycythemia vera: Secondary | ICD-10-CM

## 2022-09-16 DIAGNOSIS — Z95810 Presence of automatic (implantable) cardiac defibrillator: Secondary | ICD-10-CM

## 2022-09-16 DIAGNOSIS — Z7982 Long term (current) use of aspirin: Secondary | ICD-10-CM

## 2022-09-16 DIAGNOSIS — I6523 Occlusion and stenosis of bilateral carotid arteries: Secondary | ICD-10-CM | POA: Diagnosis not present

## 2022-09-16 DIAGNOSIS — E785 Hyperlipidemia, unspecified: Secondary | ICD-10-CM | POA: Diagnosis present

## 2022-09-16 DIAGNOSIS — Z6834 Body mass index (BMI) 34.0-34.9, adult: Secondary | ICD-10-CM

## 2022-09-16 DIAGNOSIS — Z981 Arthrodesis status: Secondary | ICD-10-CM

## 2022-09-16 DIAGNOSIS — E039 Hypothyroidism, unspecified: Secondary | ICD-10-CM | POA: Diagnosis present

## 2022-09-16 DIAGNOSIS — I110 Hypertensive heart disease with heart failure: Secondary | ICD-10-CM | POA: Diagnosis present

## 2022-09-16 DIAGNOSIS — I6389 Other cerebral infarction: Secondary | ICD-10-CM | POA: Diagnosis not present

## 2022-09-16 DIAGNOSIS — I63421 Cerebral infarction due to embolism of right anterior cerebral artery: Principal | ICD-10-CM | POA: Diagnosis present

## 2022-09-16 DIAGNOSIS — I639 Cerebral infarction, unspecified: Secondary | ICD-10-CM | POA: Diagnosis not present

## 2022-09-16 DIAGNOSIS — Z79890 Hormone replacement therapy (postmenopausal): Secondary | ICD-10-CM | POA: Diagnosis not present

## 2022-09-16 DIAGNOSIS — Z951 Presence of aortocoronary bypass graft: Secondary | ICD-10-CM | POA: Diagnosis not present

## 2022-09-16 DIAGNOSIS — I63511 Cerebral infarction due to unspecified occlusion or stenosis of right middle cerebral artery: Secondary | ICD-10-CM | POA: Diagnosis not present

## 2022-09-16 DIAGNOSIS — I6501 Occlusion and stenosis of right vertebral artery: Secondary | ICD-10-CM | POA: Diagnosis not present

## 2022-09-16 DIAGNOSIS — R7303 Prediabetes: Secondary | ICD-10-CM | POA: Diagnosis present

## 2022-09-16 DIAGNOSIS — R29810 Facial weakness: Secondary | ICD-10-CM | POA: Diagnosis present

## 2022-09-16 DIAGNOSIS — N401 Enlarged prostate with lower urinary tract symptoms: Secondary | ICD-10-CM | POA: Diagnosis present

## 2022-09-16 DIAGNOSIS — M545 Low back pain, unspecified: Secondary | ICD-10-CM | POA: Diagnosis present

## 2022-09-16 DIAGNOSIS — Z820 Family history of epilepsy and other diseases of the nervous system: Secondary | ICD-10-CM

## 2022-09-16 DIAGNOSIS — Z79899 Other long term (current) drug therapy: Secondary | ICD-10-CM | POA: Insufficient documentation

## 2022-09-16 DIAGNOSIS — I447 Left bundle-branch block, unspecified: Secondary | ICD-10-CM | POA: Diagnosis present

## 2022-09-16 DIAGNOSIS — G4733 Obstructive sleep apnea (adult) (pediatric): Secondary | ICD-10-CM | POA: Diagnosis present

## 2022-09-16 LAB — CBC WITH DIFFERENTIAL (CANCER CENTER ONLY)
Abs Immature Granulocytes: 0.15 10*3/uL — ABNORMAL HIGH (ref 0.00–0.07)
Basophils Absolute: 0.1 10*3/uL (ref 0.0–0.1)
Basophils Relative: 1 %
Eosinophils Absolute: 0 10*3/uL (ref 0.0–0.5)
Eosinophils Relative: 0 %
HCT: 48.5 % (ref 39.0–52.0)
Hemoglobin: 16.1 g/dL (ref 13.0–17.0)
Immature Granulocytes: 1 %
Lymphocytes Relative: 6 %
Lymphs Abs: 0.8 10*3/uL (ref 0.7–4.0)
MCH: 37.1 pg — ABNORMAL HIGH (ref 26.0–34.0)
MCHC: 33.2 g/dL (ref 30.0–36.0)
MCV: 111.8 fL — ABNORMAL HIGH (ref 80.0–100.0)
Monocytes Absolute: 0.5 10*3/uL (ref 0.1–1.0)
Monocytes Relative: 4 %
Neutro Abs: 11.6 10*3/uL — ABNORMAL HIGH (ref 1.7–7.7)
Neutrophils Relative %: 88 %
Platelet Count: 404 10*3/uL — ABNORMAL HIGH (ref 150–400)
RBC: 4.34 MIL/uL (ref 4.22–5.81)
RDW: 13.9 % (ref 11.5–15.5)
WBC Count: 13.1 10*3/uL — ABNORMAL HIGH (ref 4.0–10.5)
nRBC: 0 % (ref 0.0–0.2)

## 2022-09-16 LAB — CMP (CANCER CENTER ONLY)
ALT: 53 U/L — ABNORMAL HIGH (ref 0–44)
AST: 37 U/L (ref 15–41)
Albumin: 4.3 g/dL (ref 3.5–5.0)
Alkaline Phosphatase: 71 U/L (ref 38–126)
Anion gap: 5 (ref 5–15)
BUN: 12 mg/dL (ref 8–23)
CO2: 31 mmol/L (ref 22–32)
Calcium: 9.8 mg/dL (ref 8.9–10.3)
Chloride: 99 mmol/L (ref 98–111)
Creatinine: 0.9 mg/dL (ref 0.61–1.24)
GFR, Estimated: >60 mL/min (ref >60)
Glucose, Bld: 121 mg/dL — ABNORMAL HIGH (ref 70–99)
Potassium: 4 mmol/L (ref 3.5–5.1)
Sodium: 135 mmol/L (ref 135–145)
Total Bilirubin: 1.5 mg/dL — ABNORMAL HIGH (ref 0.3–1.2)
Total Protein: 6.7 g/dL (ref 6.5–8.1)

## 2022-09-16 LAB — URINALYSIS, ROUTINE W REFLEX MICROSCOPIC
Bacteria, UA: NONE SEEN
Bilirubin Urine: NEGATIVE
Glucose, UA: NEGATIVE mg/dL
Hgb urine dipstick: NEGATIVE
Ketones, ur: NEGATIVE mg/dL
Leukocytes,Ua: NEGATIVE
Nitrite: NEGATIVE
Protein, ur: 100 mg/dL — AB
Specific Gravity, Urine: 1.015 (ref 1.005–1.030)
pH: 6 (ref 5.0–8.0)

## 2022-09-16 LAB — PROTIME-INR
INR: 1.9 — ABNORMAL HIGH (ref 0.8–1.2)
Prothrombin Time: 21.7 seconds — ABNORMAL HIGH (ref 11.4–15.2)

## 2022-09-16 LAB — MAGNESIUM: Magnesium: 1.7 mg/dL (ref 1.7–2.4)

## 2022-09-16 LAB — BLOOD GAS, VENOUS
Acid-Base Excess: 3.8 mmol/L — ABNORMAL HIGH (ref 0.0–2.0)
Bicarbonate: 29.2 mmol/L — ABNORMAL HIGH (ref 20.0–28.0)
O2 Saturation: 54.1 %
Patient temperature: 37
pCO2, Ven: 46 mmHg (ref 44–60)
pH, Ven: 7.41 (ref 7.25–7.43)
pO2, Ven: <31 mmHg — CL (ref 32–45)

## 2022-09-16 LAB — AMMONIA: Ammonia: 23 umol/L (ref 9–35)

## 2022-09-16 LAB — CBG MONITORING, ED: Glucose-Capillary: 126 mg/dL — ABNORMAL HIGH (ref 70–99)

## 2022-09-16 LAB — TSH: TSH: 1.717 u[IU]/mL (ref 0.350–4.500)

## 2022-09-16 MED ORDER — EZETIMIBE 10 MG PO TABS
10.0000 mg | ORAL_TABLET | Freq: Every day | ORAL | Status: DC
  Administered 2022-09-17 – 2022-09-18 (×2): 10 mg via ORAL
  Filled 2022-09-16 (×2): qty 1

## 2022-09-16 MED ORDER — ADULT MULTIVITAMIN W/MINERALS CH
1.0000 | ORAL_TABLET | Freq: Every day | ORAL | Status: DC
  Administered 2022-09-17 – 2022-09-18 (×2): 1 via ORAL
  Filled 2022-09-16 (×2): qty 1

## 2022-09-16 MED ORDER — ASPIRIN 81 MG PO TBEC
81.0000 mg | DELAYED_RELEASE_TABLET | Freq: Every day | ORAL | Status: DC
  Administered 2022-09-17 – 2022-09-18 (×2): 81 mg via ORAL
  Filled 2022-09-16 (×2): qty 1

## 2022-09-16 MED ORDER — IOHEXOL 350 MG/ML SOLN
75.0000 mL | Freq: Once | INTRAVENOUS | Status: AC | PRN
  Administered 2022-09-16: 75 mL via INTRAVENOUS

## 2022-09-16 MED ORDER — ROSUVASTATIN CALCIUM 20 MG PO TABS
40.0000 mg | ORAL_TABLET | Freq: Every day | ORAL | Status: DC
  Administered 2022-09-17 – 2022-09-18 (×2): 40 mg via ORAL
  Filled 2022-09-16 (×2): qty 2

## 2022-09-16 MED ORDER — DOCUSATE SODIUM 100 MG PO CAPS
100.0000 mg | ORAL_CAPSULE | Freq: Two times a day (BID) | ORAL | Status: DC
  Administered 2022-09-16 – 2022-09-18 (×4): 100 mg via ORAL
  Filled 2022-09-16 (×4): qty 1

## 2022-09-16 MED ORDER — FINASTERIDE 5 MG PO TABS
5.0000 mg | ORAL_TABLET | Freq: Every day | ORAL | Status: DC
  Administered 2022-09-17 – 2022-09-18 (×2): 5 mg via ORAL
  Filled 2022-09-16 (×2): qty 1

## 2022-09-16 MED ORDER — ACETAMINOPHEN 500 MG PO TABS: 500.0000 mg | ORAL_TABLET | Freq: Four times a day (QID) | ORAL | Status: DC | PRN

## 2022-09-16 MED ORDER — MAGNESIUM OXIDE -MG SUPPLEMENT 400 (240 MG) MG PO TABS
400.0000 mg | ORAL_TABLET | Freq: Every day | ORAL | Status: DC
  Administered 2022-09-17 – 2022-09-18 (×2): 400 mg via ORAL
  Filled 2022-09-16 (×2): qty 1

## 2022-09-16 MED ORDER — VITAMIN B-12 1000 MCG PO TABS
1000.0000 ug | ORAL_TABLET | Freq: Every day | ORAL | Status: DC
  Administered 2022-09-17 – 2022-09-18 (×2): 1000 ug via ORAL
  Filled 2022-09-16 (×2): qty 1

## 2022-09-16 MED ORDER — PANTOPRAZOLE SODIUM 40 MG PO TBEC
40.0000 mg | DELAYED_RELEASE_TABLET | Freq: Every day | ORAL | Status: DC
  Administered 2022-09-17 – 2022-09-18 (×2): 40 mg via ORAL
  Filled 2022-09-16 (×2): qty 1

## 2022-09-16 MED ORDER — HYDROXYUREA 500 MG PO CAPS
1000.0000 mg | ORAL_CAPSULE | Freq: Every day | ORAL | Status: DC
  Administered 2022-09-17 – 2022-09-18 (×2): 1000 mg via ORAL
  Filled 2022-09-16 (×2): qty 2

## 2022-09-16 MED ORDER — LEVOTHYROXINE SODIUM 100 MCG PO TABS
200.0000 ug | ORAL_TABLET | Freq: Every day | ORAL | Status: DC
  Administered 2022-09-17 – 2022-09-18 (×2): 200 ug via ORAL
  Filled 2022-09-16 (×2): qty 2

## 2022-09-16 MED ORDER — TAMSULOSIN HCL 0.4 MG PO CAPS
0.4000 mg | ORAL_CAPSULE | Freq: Every day | ORAL | Status: DC
  Administered 2022-09-17 – 2022-09-18 (×2): 0.4 mg via ORAL
  Filled 2022-09-16 (×2): qty 1

## 2022-09-16 MED ORDER — ALLOPURINOL 300 MG PO TABS
300.0000 mg | ORAL_TABLET | Freq: Every day | ORAL | Status: DC
  Filled 2022-09-16: qty 3
  Filled 2022-09-16 (×2): qty 1

## 2022-09-16 MED ORDER — STROKE: EARLY STAGES OF RECOVERY BOOK
Freq: Once | Status: AC
  Filled 2022-09-16: qty 1

## 2022-09-16 NOTE — Progress Notes (Signed)
HEMATOLOGY/ONCOLOGY CLINIC VISIT NOTE  Date of Service: 09/16/22   Patient Care Team: Suzan Slick, MD as PCP - General (Family Medicine) Regan Lemming, MD as PCP - Electrophysiology (Cardiology) Mayme Genta, Theotis Barrio, MD as Referring Physician (Cardiology) Boneta Lucks Olga Coaster, MD as Referring Physician (Cardiology) Johney Maine, MD as Consulting Physician (Hematology) Lajoyce Corners, [facility] (Inactive) as Pharmacist (Pharmacist) Dohmeier, Porfirio Mylar, MD as Consulting Physician (Neurology)  CHIEF COMPLAINT F/u for continued evaluation and mx of polycythemia vera  HISTORY OF PRESENTING ILLNESS:  Please see previous note for details on initial presentation  INTERVAL HISTORY:   [name] [name] is here for continued evaluation and management of [name]. Patient was last seen by me on 04/14/2022 and complained of persistent stomach pain, decreased p.o. intake, memory issues, and urinary frequency.  Today, he is accompanied by his wife. His wife reports that he has experienced significant memory loss beginning two days ago. She reports that his brain fog has suddenly worsened since receiving a colonoscopy a couple weeks ago. However, symptoms did not occur immediately after colonoscopy. His wife also reports that patient has slept for nearly the entirety of the last two days, which is a significant change from usual habits.   Patient continues to retain long term memory such as his date of birth. However, he does endorse significant short-term memory issues. Patient is aware of his location, but is unsure of the reason he has presented to [facility] and does not remember me. Patient is able to recall the year, but is hesitant to name the President of the [place].  His wife reports that he had a fall on the carpet at 9:30 AM due to joint issues and had difficulty standing back up, causing her to call 911. Patient does not recall this incident.   His wife does  report a Fhx of Alzheimer's disease. Patient has been on antibiotics for the last couple of weeks for chronic ulcer of left foot.  He denies any chest pain, abdominal pain, or SOB. He denies any problems passing urine or change in urine color.His wife reports difficulty breathing which has been stable. Patient denies any falls or head injuries in the last couple of weeks. He denies any fever, chills, night sweats, infection issues, new medications  He has lost weight despite normal eating habits and does endorse intermittent constipation. Patient does report sleep disturbances due to urinary frequency 3-5 times a night.   Patient denies any weakness or drooping of the mouth. He denies any headache and reports having normal BP readings.Patient has not eaten since this morning. He walks at home and mows the lawn regularly.  MEDICAL HISTORY:  Past Medical History:  Diagnosis Date   A-fib (HCC) 01/2014   AICD (automatic cardioverter/defibrillator) present    [employer]   Anal fissure 11/10/2019   Arthritis    ASHD (arteriosclerotic heart disease) 2015   s/p CABG   Cataract    s/p left   CHF (congestive heart failure) (HCC)    COPD (chronic obstructive pulmonary disease) (HCC)    by CXR, pt unsure of this   Diverticulosis    GERD (gastroesophageal reflux disease)    Gout 2014   Hyperlipidemia    Hypertension    Hypothyroidism    LBBB (left bundle branch block)    Polycythemia vera (HCC)    Prediabetes 01/2014   Presence of permanent cardiac pacemaker    Sleep apnea    no CPAP  SURGICAL HISTORY: Past Surgical History:  Procedure Laterality Date   CARDIAC DEFIBRILLATOR PLACEMENT  07/2014   CATARACT EXTRACTION W/ INTRAOCULAR LENS IMPLANT Left    COLONOSCOPY  08/26/2022   normal   CORONARY ARTERY BYPASS GRAFT  11/2013   ESOPHAGOGASTRODUODENOSCOPY  08/26/2022   esophageal dilation   lumb laminectomy  [phone]   x 3   LUMBAR FUSION  2013   NASAL SEPTOPLASTY W/  TURBINOPLASTY Bilateral 01/30/2016   Procedure: NASAL SEPTOPLASTY WITH BILATERAL TURBINATE REDUCTION;  Surgeon: Drema Halon, MD;  Location: [facility] OR;  Service: ENT;  Laterality: Bilateral;   TONSILLECTOMY     UVULECTOMY N/A 01/30/2016   Procedure: Alfredo Martinez;  Surgeon: Drema Halon, MD;  Location: [facility] OR;  Service: ENT;  Laterality: N/A;    SOCIAL HISTORY: Social History   Socioeconomic History   Marital status: Married    Spouse name: [name]    Number of children: 2   Years of education: 13   Highest education level: Not on file  Occupational History   Occupation: Retired  Tobacco Use   Smoking status: Former    Packs/day: 2.00    Years: 25.00    Additional pack years: 0.00    Total pack years: 50.00    Types: Cigarettes    Quit date: 05/20/1983    Years since quitting: 39.3   Smokeless tobacco: Never  Vaping Use   Vaping Use: Never used  Substance and Sexual Activity   Alcohol use: Never   Drug use: Never   Sexual activity: Not Currently  Other Topics Concern   Not on file  Social History Narrative   Lives with wife, [name]   Caffeine use: daily (Coffee)   Social Determinants of Health   Financial Resource Strain: Not on file  Food Insecurity: Not on file  Transportation Needs: No Transportation Needs (03/29/2021)   PRAPARE - Administrator, Civil Service (Medical): No    Lack of Transportation (Non-Medical): No  Physical Activity: Not on file  Stress: Not on file  Social Connections: Not on file  Intimate Partner Violence: Not on file    FAMILY HISTORY: Family History  Problem Relation Age of Onset   Alzheimer's disease Mother    Mental illness Mother    Depression Mother    Heart disease Father 60       had 5 MI's & died 51 yo   Hypertension Father    Alcohol abuse Son    Heart attack Son    Stroke Maternal Grandmother    Dementia Maternal Grandmother    Cancer - Prostate Other    Colon cancer Neg Hx    Stomach cancer Neg Hx     Esophageal cancer Neg Hx     ALLERGIES:  is allergic to other.  MEDICATIONS:  Current Outpatient Medications  Medication Sig Dispense Refill   acetaminophen (TYLENOL) 500 MG tablet Take 500 mg by mouth as needed for mild pain.     allopurinol (ZYLOPRIM) 300 MG tablet TAKE 1 TABLET EVERY DAY TO PREVENT GOUT 90 tablet 3   aspirin EC 81 MG tablet Take 81 mg by mouth daily.     Biotin 29562 MCG TABS Take by mouth.     Cyanocobalamin (B-12) 1000 MCG CAPS Take by mouth.     Docosahexaenoic Acid (DHA OMEGA 3 PO) Take by mouth. 1000 mg     docusate sodium (COLACE) 100 MG capsule Take 100 mg by mouth 2 (two) times daily. 1 tablet in  the morning and 1 tablet at bedtime (stool softener)     ezetimibe (ZETIA) 10 MG tablet Take 1 tab daily at night for cholesterol. 90 tablet 3   finasteride (PROSCAR) 5 MG tablet TAKE 1 TABLET EVERY DAY FOR PROSTATE 90 tablet 3   hydroxyurea (HYDREA) 500 MG capsule TAKE 2 CAPSULES ONE TIME DAILY WITH FOOD 180 capsule 3   isosorbide mononitrate (IMDUR) 30 MG 24 hr tablet Take 1 tablet (30 mg total) by mouth daily. Take by mouth daily. 90 tablet 3   levothyroxine (SYNTHROID) 200 MCG tablet TAKE 1 TAB DAILY ON EMPTY STOMACH WITH ONLY WATER FOR 30 MINS. NO ANTACIDS, CALCIUM OR MAGNESIUM FOR 4 HOURS. AVOID BIOTIN. (Patient taking differently: TAKE 1/2 TAB on Mon, Wed, Friday and take 1 whole tablet on all other days ON EMPTY STOMACH WITH ONLY WATER FOR 30 MINS. NO ANTACIDS, CALCIUM OR MAGNESIUM FOR 4 HOURS. AVOID BIOTIN.) 90 tablet 3   magnesium oxide (MAG-OX) 400 MG tablet Take by mouth.     MELATONIN PO Take 1 tablet by mouth as needed.     metoprolol (TOPROL-XL) 200 MG 24 hr tablet Take 200 mg by mouth daily.     Multiple Vitamin (MULTIVITAMIN) tablet Take 1 tablet by mouth daily. A-Z     omeprazole (PRILOSEC) 20 MG capsule TAKE 1 CAPSULE TWICE DAILY  ( EVERY 12 HOURS  ) TO PREVENT HEARTBURN AND ACID REFLUX 180 capsule 3   predniSONE (STERAPRED UNI-PAK 21 TAB) 10 MG (21)  TBPK tablet Take by mouth daily. Take 6 tabs by mouth daily  for 2 days, then 5 tabs for 2 days, then 4 tabs for 2 days, then 3 tabs for 2 days, 2 tabs for 2 days, then 1 tab by mouth daily for 2 days 42 tablet 0   Probiotic Product (PROBIOTIC-10 PO) Take by mouth daily.     rosuvastatin (CRESTOR) 40 MG tablet Take 1 tablet (40 mg total) by mouth daily. 90 tablet 3   tamsulosin (FLOMAX) 0.4 MG CAPS capsule Take 1 tablet at Bedtime for Prostate & Bladder 90 capsule 3   torsemide (DEMADEX) 20 MG tablet Take 1 tablet every Morning for Swelling in legs 90 tablet 3   TURMERIC PO Take 1,000 mg by mouth daily.     warfarin (COUMADIN) 5 MG tablet Take 1 tablet (5 mg total) by mouth daily. 90 tablet 0   Zinc 50 MG TABS Take by mouth.     No current facility-administered medications for this visit.    REVIEW OF SYSTEMS:    10 Point review of Systems was done is negative except as noted above.   PHYSICAL EXAMINATION:  .BP 108/72   Pulse 85   Temp 97.8 F (36.6 C)   Resp 18   Wt 220 lb 11.2 oz (100.1 kg)   SpO2 100%   BMI 34.57 kg/m   GENERAL:alert, in no acute distress and comfortable SKIN: no acute rashes, no significant lesions EYES: conjunctiva are pink and non-injected, sclera anicteric OROPHARYNX: MMM, no exudates, no oropharyngeal erythema or ulceration NECK: supple, no JVD LYMPH:  no palpable lymphadenopathy in the cervical, axillary or inguinal regions LUNGS: clear to auscultation b/l with normal respiratory effort HEART: regular rate & rhythm ABDOMEN:  normoactive bowel sounds , non tender, not distended. Extremity: no pedal edema PSYCH: alert & oriented x 3 with fluent speech NEURO: no focal motor/sensory deficits   LABORATORY DATA:  I have reviewed the data as listed  .  Latest Ref Rng & Units 09/16/2022   12:23 PM 04/14/2022   12:09 PM 12/26/2021    3:34 PM  CBC  WBC 4.0 - 10.5 K/uL 13.1  13.8  16.7   Hemoglobin 13.0 - 17.0 g/dL 40.9  81.1  91.4   Hematocrit 39.0  - 52.0 % 48.5  42.8  39.4   Platelets 150 - 400 K/uL 404  344  411     .    Latest Ref Rng & Units 09/18/2022    6:56 AM 09/16/2022   12:23 PM 06/11/2022    1:56 PM  CMP  Glucose 70 - 99 mg/dL 782  956  213   BUN 8 - 23 mg/dL 14  12  29    Creatinine 0.61 - 1.24 mg/dL 0.86  5.78  4.69   Sodium 135 - 145 mmol/L 134  135  134   Potassium 3.5 - 5.1 mmol/L 3.9  4.0  5.2   Chloride 98 - 111 mmol/L 99  99  95   CO2 22 - 32 mmol/L 26  31  24    Calcium 8.9 - 10.3 mg/dL 9.1  9.8  9.6   Total Protein 6.5 - 8.1 g/dL  6.7    Total Bilirubin 0.3 - 1.2 mg/dL  1.5    Alkaline Phos 38 - 126 U/L  71    AST 15 - 41 U/L  37    ALT 0 - 44 U/L  53      02/09/18 JAK2 Mutation Study:    02/09/18 BCR ABL FISH:    RADIOGRAPHIC STUDIES: I have personally reviewed the radiological images as listed and agreed with the findings in the report. DG Elbow Complete Right  Result Date: 08/27/2022 CLINICAL DATA:  RIGHT elbow pain for 1 day. EXAM: RIGHT ELBOW - COMPLETE 3+ VIEW COMPARISON:  None Available. FINDINGS: An elbow effusion is noted. There is no evidence of acute fracture, subluxation or dislocation. Moderate degenerative changes at the humeral ulnar joint noted. No focal bony lesions are present. IMPRESSION: 1. Elbow effusion without acute bony abnormality. 2. Moderate degenerative changes at the humeral ulnar joint. Electronically Signed   By: Harmon Pier M.D.   On: 08/27/2022 15:38    ASSESSMENT & PLAN:   82 y.o. male with  1. 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 Some associated leukocytosis. No history of splenectomy. No previous h/o VTE, but significant cardiac risk factors. 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    02/09/18 BCR ABL FISH revealed no evidence of BCR/ABL rearrangement   2. . Patient Active Problem List   Diagnosis Date Noted   Encounter for screening colonoscopy 08/26/2022   Chronic ulcer of left foot with fat layer exposed (HCC) 07/10/2022   Zenker's diverticulum 10/16/2021   Loss of appetite 10/10/2021   Loss of weight 10/10/2021   Generalized abdominal pain 10/10/2021   Thrombosed external hemorrhoid 10/10/2021   Dysphagia 08/01/2021   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   Intolerance of continuous positive airway pressure (CPAP) ventilation 05/02/2021   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   JAK2 V617F mutation 08/27/2020   Rectal pain 11/10/2019   Constipation 11/10/2019   Abnormal glucose 10/12/2019   Memory changes 01/10/2019   Polycythemia vera (HCC) 10/21/2018  Thrombocytosis 12/29/2017   Senile purpura (HCC) 09/01/2017   Emphysema of lung (HCC) 06/01/2017   Tortuous aorta (HCC)- CXR 2018 06/01/2017   Regular astigmatism, right eye 10/16/2016   Combined forms of age-related cataract of right eye 10/16/2016   Trigger ring finger of left hand 08/06/2016   Bilateral carpal tunnel syndrome 08/06/2016   Ischemic cardiomyopathy 06/22/2015   ASHD/CABG x 3V (11/2013) 02/06/2015   Acquired thrombophilia (HCC) 02/06/2015   Cardiac pacemaker/AICD (01/2014) 02/06/2015   Morbid obesity (HCC) 01/03/2015   HTN (hypertension) 01/02/2015   Hyperlipidemia, mixed 01/02/2015   Vitamin D deficiency 01/02/2015   GERD  01/02/2015   BPH (benign prostatic hyperplasia) 01/02/2015   Medication management 01/02/2015   Atrial fibrillation (HCC) 01/02/2015   Hypothyroidism 01/02/2015   OSA and COPD overlap syndrome (HCC) 01/02/2015   Chronic systolic (congestive) heart failure (HCC) 02/17/2014   S/P CABG x 3 02/13/2014   Atherosclerotic heart disease of native coronary artery without angina pectoris 02/08/2014   Gout 02/08/2014   Pulmonary hypertension due to left  heart disease (HCC) 02/06/2014  -continue f/u with PCP to optimize atherosclerotic risk factors.  PLAN:  -Discussed lab results on 09/16/2022 in detail with patient. CBC showed WBC of 13.1K, hemoglobin of 16.1, and platelets of 404K. -Patient's previous mild cognitive impairment has progressed to concerning significant change in mental status -do not recommend patient to drive at this time -will refer patient to ED for further evaluation of significant change in mental status. Would recommend CT head scan and urine testing to rule out any acute changes or stroke -Patient has not had a mental evaluation with PCP -no obvious triggers for patient's significant change in mental status -informed patient that factors such as infection or sleep deprivation or UTIs may trigger worsened brain fog -JAK2 positive polycythemia vera stable -Patient has no current indication for therapeutic phlebotomy at this time -No notable toxicity from his current dose of hydroxyurea -He will continue his hydroxyurea at 1000 mg p.o. daily Monday to through Friday and 500mg  po daily on Saturday and sunday -Recommend to follow the Omeprazole 20 mg dosage, twice a day, for acid reflux and stomach pain.   FOLLOW-UP: Referral to ER fro acute mental status changes  The total time spent in the appointment was 30 minutes* .  All of the patient's questions were answered with apparent satisfaction. The patient knows to call the clinic with any problems, questions or concerns.   Wyvonnia Lora MD MS AAHIVMS [facility] Hematology/Oncology Physician [facility]  .*Total Encounter Time as defined by the Centers for Medicare and Medicaid Services includes, in addition to the face-to-face time of a patient visit (documented in the note above) non-face-to-face time: obtaining and reviewing outside history, ordering and reviewing medications, tests or procedures, care coordination (communications with other health care  professionals or caregivers) and documentation in the medical record.    I,Mitra Faeizi,acting as a Neurosurgeon for Wyvonnia Lora, MD.,have documented all relevant documentation on the behalf of Wyvonnia Lora, MD,as directed by  Wyvonnia Lora, MD while in the presence of Wyvonnia Lora, MD.  .I have reviewed the above documentation for accuracy and completeness, and I agree with the above. Johney Maine MD

## 2022-09-16 NOTE — Assessment & Plan Note (Signed)
Continue PPI ?

## 2022-09-16 NOTE — Assessment & Plan Note (Signed)
Will hold nitrates and metoprolol to allow for permissive hypertension

## 2022-09-16 NOTE — Assessment & Plan Note (Signed)
Hold torsemide and metoprolol to allow for permissive hypertension Last 2D echocardiogram from 11/23 showed an LVEF of 55 to 60%

## 2022-09-16 NOTE — Progress Notes (Signed)
14:00: Pt transferred to ED. Pt was seen at [facility] by Dr Candise Che today. Pt unable to answer simple questions, hx of fall at home this am and unable to get up (wife called EMS to help her). Pt looked at wife every time he was asked a question. Wife stated this started 2 days ago. Pt was able to drive from [place] to the [facility]. Verbal report given to RN.

## 2022-09-16 NOTE — Assessment & Plan Note (Signed)
History of coronary artery disease status post CABG  Continue aspirin, high intensity statin Hold nitrates and metoprolol to allow for permissive hypertension

## 2022-09-16 NOTE — Assessment & Plan Note (Signed)
Continue allopurinol 

## 2022-09-16 NOTE — ED Triage Notes (Signed)
Patient presents from the [facility] where he is monitored. While there, staff noticed he seemed altered. While normally A&O, today he was not. Today he also had a fall in which he fell on his knees, and EMS had to come to pick him up. His MD is concerned he may have had a stroke a few days ago. Family reports he recently had a colonoscopy in which he developed brain fog after, however, there was a drastic change 2-3 days ago.

## 2022-09-16 NOTE — Assessment & Plan Note (Signed)
Continue Synthroid °

## 2022-09-16 NOTE — Assessment & Plan Note (Signed)
Continue Flomax and finasteride 

## 2022-09-16 NOTE — Progress Notes (Signed)
Pt arrived to the unit. Oriented to self. Follows command. Cardiac tele connected to CCMD. Bed alarms on. Call button within pt reach. Pt wife called and updated of pt.room number.

## 2022-09-16 NOTE — Consult Note (Signed)
Neurology Consultation Reason for Consult: Stroke on Head CT Requesting Physician: Tochukwu Agbata   CC: Altered mental status, worsening memory  History is obtained from: Patient, wife at bedside, chart review  HPI: [name] [name] is a 82 y.o. 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  A few days after that his wife felt like he developed brain fog.  Sunday he had a wonderful day and seemed totally his normal self in the evening.  However Monday night he had trouble figuring out how to set up the coffee which is his usual routine (setting it up so that it is readily available when they wake up in the morning).  He was apparently also quite fatigued per report to oncology on Monday/Tuesday.  He was sent to the ED for workup and noted to have a stroke for which neurology was consulted  Wife denies any other new cardiac symptoms, fevers, chills, blood in the patient's stool or urine, or any focal weakness, clear speech difficulty or difficulty understanding speech other than his general confusion  LKW: Sunday evening Thrombolytic given?: No, the window, completed stroke on head CT and too mild to treat  IA performed?: No, exam not consistent with LVO Premorbid modified rankin scale: 2  ROS: Unable to obtain due to confusion, obtained from wife as above.   Past Medical History:  Diagnosis Date   A-fib (HCC) 01/2014   AICD (automatic cardioverter/defibrillator) present    [employer]   Anal fissure 11/10/2019   Arthritis    ASHD  (arteriosclerotic heart disease) 2015   s/p CABG   Cataract    s/p left   CHF (congestive heart failure) (HCC)    COPD (chronic obstructive pulmonary disease) (HCC)    by CXR, pt unsure of this   Diverticulosis    GERD (gastroesophageal reflux disease)    Gout 2014   Hyperlipidemia    Hypertension    Hypothyroidism    LBBB (left bundle branch block)    Polycythemia vera (HCC)    Prediabetes 01/2014   Presence of permanent cardiac pacemaker    Sleep apnea    no CPAP   Past Surgical History:  Procedure Laterality Date   CARDIAC DEFIBRILLATOR PLACEMENT  07/2014   CATARACT EXTRACTION W/ INTRAOCULAR LENS IMPLANT Left    COLONOSCOPY  08/26/2022   normal   CORONARY ARTERY BYPASS GRAFT  11/2013   ESOPHAGOGASTRODUODENOSCOPY  08/26/2022   esophageal dilation   lumb laminectomy  [phone]   x 3   LUMBAR FUSION  2013   NASAL SEPTOPLASTY W/ TURBINOPLASTY Bilateral 01/30/2016   Procedure: NASAL SEPTOPLASTY WITH BILATERAL TURBINATE REDUCTION;  Surgeon: Drema Halon, MD;  Location: [facility] OR;  Service: ENT;  Laterality: Bilateral;   TONSILLECTOMY     UVULECTOMY N/A 01/30/2016   Procedure: Alfredo Martinez;  Surgeon: Drema Halon, MD;  Location: [facility] OR;  Service: ENT;  Laterality: N/A;     Family History  Problem Relation Age of Onset   Alzheimer's disease Mother    Mental illness Mother    Depression Mother    Heart disease Father 65       had 5 MI's &  died 18 yo   Hypertension Father    Alcohol abuse Son    Heart attack Son    Stroke Maternal Grandmother    Dementia Maternal Grandmother    Cancer - Prostate Other    Colon cancer Neg Hx    Stomach cancer Neg Hx    Esophageal cancer Neg Hx    Social History:  reports that he quit smoking about 39 years ago. His smoking use included cigarettes. He has a 50.00 pack-year smoking history. He has never used smokeless tobacco. He reports that he does not drink alcohol and does not use drugs.   Exam: Current vital  signs: BP (!) 168/82   Pulse 71   Temp 98.4 F (36.9 C) (Oral)   Resp 20   SpO2 95%  Vital signs in last 24 hours: Temp:  [97.6 F (36.4 C)-98.4 F (36.9 C)] 98.4 F (36.9 C) (04/30 1825) Pulse Rate:  [69-85] 71 (04/30 1945) Resp:  [14-22] 20 (04/30 1945) BP: (108-168)/(71-91) 168/82 (04/30 1945) SpO2:  [95 %-100 %] 95 % (04/30 1945) Weight:  [100.1 kg] 100.1 kg (04/30 1240)   Physical Exam  Constitutional: Appears well-developed and well-nourished.  Psych: Affect pleasant and cooperative Eyes: No scleral injection HENT: No oropharyngeal obstruction.  MSK: no major joint deformities.  Cardiovascular: Perfusing extremities well Respiratory: Effort normal, non-labored breathing GI: Soft.  No distension. There is no tenderness.  Skin: Warm dry and intact visible skin  Neuro: Mental Status: Patient is awake, alert, oriented to person but has difficulty naming his wife at bedside, not oriented to age, month, location, and unable to provide any significant details of history.  He has some difficulty naming even low-frequency objects for example cannot name glasses.  No neglect.  Some difficulty with attention/concentration requiring repeated instructions to complete exam Cranial Nerves: II: Visual Fields are full to orienting to stimuli in all visual fields. Pupils are equal, round, and reactive to light.   III,IV, VI: EOMI without ptosis or diploplia.  V: Facial sensation is symmetric to temperature VII: Facial movement is notable for a mild left facial droop which wife reports is baseline VIII: hearing is intact to voice X: Uvula elevates symmetrically XI: Shoulder shrug is symmetric. XII: tongue is midline without atrophy or fasciculations.  Motor: 5/5 strength was present in all four extremities.  Slight pronation of bilateral upper extremities without drift Sensory: Sensation is symmetric to light touch in the arms and legs. Deep Tendon Reflexes: 2+ and symmetric in the  brachioradialis and patellae.  Cerebellar: FNF and HKS are intact bilaterally, heel-to-shin slightly limited by prior hip replacements Gait:  Stooped posture with reduced arm swing, but steady ambulation using both legs equally.  Able to rise on toes bilaterally but difficulty raising on the heel on his left foot  NIHSS total 1 Score breakdown: Mild left facial droop, chronic per wife Performed at 4 AM on 5/1   I have reviewed labs in epic and the results pertinent to this consultation are:  Basic Metabolic Panel: Recent Labs  Lab 09/16/22 1223 09/16/22 1412  NA 135  --   K 4.0  --   CL 99  --   CO2 31  --   GLUCOSE 121*  --   BUN 12  --   CREATININE 0.90  --   CALCIUM 9.8  --   MG  --  1.7    CBC: Recent Labs  Lab 09/16/22 1223  WBC 13.1*  NEUTROABS 11.6*  HGB 16.1  HCT 48.5  MCV 111.8*  PLT 404*    Coagulation Studies: Recent Labs    09/16/22 1545  LABPROT 21.7*  INR 1.9*      I have reviewed the images obtained:  Head CT  Potentially acute or subacute infarct in the right frontal lobe. Recommend MRI to further evaluate.  CTA head/neck 1. Suspected subocclusive embolus in a right M2 superior division branch vessel corresponding to the infarct on today's earlier head CT. 2. Severe left P1 stenosis. 3. 55% proximal right ICA stenosis. 4. 60% proximal left ICA stenosis. 5.  Aortic Atherosclerosis (ICD10-I70.0).       Impression: Cardioembolic stroke in the setting of warfarin being held for colonoscopy, fortunately without significant physical symptoms with minimal left-sided weakness on my evaluation (really only detectable on gait testing with foot dorsiflexion).  However in pre-existing dementia significant stepwise decline on top of gradual progressive decline can be seen with stroke due to poor ability to compensate.  However wife reports that patient did not have any significant word finding issues previously and therefore I do think a repeat  head CT if MRI cannot be obtained to exclude any substantial left MCA territory stroke is reasonable.  Echocardiogram would not change management and as such is not indicated in the absence of any other cardiac symptoms.  Preliminary recommendations: # R MCA stroke with possibility of other strokes too small to see on head CT - Stroke labs HgbA1c, fasting lipid panel - Goal A1c less than 7% - Goal LDL less than 70 - MRI brain w/o contrast if pacer/defibrillator allows otherwise repeat head CT - CTA head and neck completed on my recommendation - Frequent neuro checks - Echocardiogram not necessarily indicated given patient has a long term indication for anticoagulation (known Afib) - Hold anticoagulation for now pending repeat head CT or MRI brain, okay for ASA 325 mg daily once INR < 1.7 if no other contraindications medically for ASA - Risk factor modification - Telemetry monitoring - Blood pressure goal   -Gradually begin to normalize blood pressure given patient is out of the permissive hypertension window - PT consult, OT consult, Speech consult - Stroke team to follow,   Brooke Dare MD-PhD [facility] [phone] Available 7 AM to 7 PM, outside these hours please contact Neurologist on call listed on AMION

## 2022-09-16 NOTE — Assessment & Plan Note (Signed)
Continue Hydrea Follow-up with oncology as an outpatient

## 2022-09-16 NOTE — Assessment & Plan Note (Signed)
Patient presented to the ER for evaluation of confusion and had a CT scan of the head done without contrast which showed potentially acute or subacute infarct in the right frontal lobe.  Will obtain MRI of the brain for further evaluation.  Patient has a pacemaker that is scheduled to be checked to make sure it is MRI compatible. Obtain CT angiogram of the head and neck to rule out LVO Obtain 2D echocardiogram to assess LVEF and rule out intra-atrial shunt as well as LV thrombus We will request PT/OT/ST consult Consult neurology Place patient on aspirin and high intensity statins Allow for permissive hypertension

## 2022-09-16 NOTE — H&P (Addendum)
History and Physical    Patient: [name] [name]:[id number] DOB: [birth year] DOA: 09/16/2022 DOS: the patient was seen and examined on 09/16/2022 PCP: [name] Slick, MD  Patient coming from: Home  Chief Complaint:  Chief Complaint  Patient presents with   Altered Mental Status   Most of the history was obtained from his wife at the bedside HPI: [name] [name] is a 82 y.o. male with medical history significant for atrial fibrillation on chronic anticoagulation therapy with Coumadin, chronic diastolic dysfunction CHF status post AICD insertion, history of coronary artery disease status post CABG, COPD, hypertension, dyslipidemia, sleep apnea who was referred to the emergency room by his oncologist for evaluation of mental status changes. Patient's wife states that he was in his usual state of health until the night prior to his admission when she noticed that he was confused.  She did not think anything about it and assumed it was related to his mild cognitive deficit.  On the morning of his admission she found him sitting on the floor of his bedroom and he stated that his left knee gave way and so he fell.  She was unable to get him up and had to call EMS who arrived and were able to get the patient off.  His wife states that he had no difficulty with ambulation and was able to get himself ready for his oncology appointment.  While at the oncologist office, the staff there were concerned about his mental status which was unusual for him.  His oncologist advised that he be sent to the ER for further evaluation to rule out a stroke. I am unable to do review of systems due to his confusion. He had a CT scan of the head done without contrast which showed potentially acute or subacute infarct in the right frontal lobe. Recommend MRI to further evaluate. ER physician discussed with neurology who recommends admission to [facility] for further evaluation.     Review of Systems: unable  to review all systems due to the inability of the patient to answer questions. Past Medical History:  Diagnosis Date   A-fib (HCC) 01/2014   AICD (automatic cardioverter/defibrillator) present    [employer]   Anal fissure 11/10/2019   Arthritis    ASHD (arteriosclerotic heart disease) 2015   s/p CABG   Cataract    s/p left   CHF (congestive heart failure) (HCC)    COPD (chronic obstructive pulmonary disease) (HCC)    by CXR, pt unsure of this   Diverticulosis    GERD (gastroesophageal reflux disease)    Gout 2014   Hyperlipidemia    Hypertension    Hypothyroidism    LBBB (left bundle branch block)    Polycythemia vera (HCC)    Prediabetes 01/2014   Presence of permanent cardiac pacemaker    Sleep apnea    no CPAP   Past Surgical History:  Procedure Laterality Date   CARDIAC DEFIBRILLATOR PLACEMENT  07/2014   CATARACT EXTRACTION W/ INTRAOCULAR LENS IMPLANT Left    COLONOSCOPY  08/26/2022   normal   CORONARY ARTERY BYPASS GRAFT  11/2013   ESOPHAGOGASTRODUODENOSCOPY  08/26/2022   esophageal dilation   lumb laminectomy  [phone]   x 3   LUMBAR FUSION  2013   NASAL SEPTOPLASTY W/ TURBINOPLASTY Bilateral 01/30/2016   Procedure: NASAL SEPTOPLASTY WITH BILATERAL TURBINATE REDUCTION;  Surgeon: Drema Halon, MD;  Location: [facility] OR;  Service: ENT;  Laterality: Bilateral;   TONSILLECTOMY  UVULECTOMY N/A 01/30/2016   Procedure: UVULECTOMY;  Surgeon: Drema Halon, MD;  Location: [facility] OR;  Service: ENT;  Laterality: N/A;   Social History:  reports that he quit smoking about 39 years ago. His smoking use included cigarettes. He has a 50.00 pack-year smoking history. He has never used smokeless tobacco. He reports that he does not drink alcohol and does not use drugs.  Allergies  Allergen Reactions   Other Swelling    SODIUM SENSITIVE    Family History  Problem Relation Age of Onset   Alzheimer's disease Mother    Mental illness Mother     Depression Mother    Heart disease Father 55       had 5 MI's & died 49 yo   Hypertension Father    Alcohol abuse Son    Heart attack Son    Stroke Maternal Grandmother    Dementia Maternal Grandmother    Cancer - Prostate Other    Colon cancer Neg Hx    Stomach cancer Neg Hx    Esophageal cancer Neg Hx     Prior to Admission medications   Medication Sig Start Date End Date Taking? Authorizing Provider  acetaminophen (TYLENOL) 500 MG tablet Take 500 mg by mouth as needed for mild pain.    [provider]  allopurinol (ZYLOPRIM) 300 MG tablet TAKE 1 TABLET EVERY DAY TO PREVENT GOUT 04/30/22   [name] Slick, MD  aspirin EC 81 MG tablet Take 81 mg by mouth daily.    [provider]  Biotin 16109 MCG TABS Take by mouth.    [provider]  Cyanocobalamin (B-12) 1000 MCG CAPS Take by mouth.    [provider]  Docosahexaenoic Acid (DHA OMEGA 3 PO) Take by mouth. 1000 mg    [provider]  docusate sodium (COLACE) 100 MG capsule Take 100 mg by mouth 2 (two) times daily. 1 tablet in the morning and 1 tablet at bedtime (stool softener)    [provider]  ezetimibe (ZETIA) 10 MG tablet Take 1 tab daily at night for cholesterol. 10/17/21   Judd Gaudier, NP  finasteride (PROSCAR) 5 MG tablet TAKE 1 TABLET EVERY DAY FOR PROSTATE 09/16/21   Lucky Cowboy, MD  hydroxyurea (HYDREA) 500 MG capsule TAKE 2 CAPSULES ONE TIME DAILY WITH FOOD 07/22/22   Johney Maine, MD  isosorbide mononitrate (IMDUR) 30 MG 24 hr tablet Take 1 tablet (30 mg total) by mouth daily. Take by mouth daily. 06/05/22   [name] Slick, MD  levothyroxine (SYNTHROID) 200 MCG tablet TAKE 1 TAB DAILY ON EMPTY STOMACH WITH ONLY WATER FOR 30 MINS. NO ANTACIDS, CALCIUM OR MAGNESIUM FOR 4 HOURS. AVOID BIOTIN. Patient taking differently: TAKE 1/2 TAB on Mon, Wed, Friday and take 1 whole tablet on all other days ON EMPTY STOMACH WITH ONLY WATER FOR 30 MINS. NO ANTACIDS,  CALCIUM OR MAGNESIUM FOR 4 HOURS. AVOID BIOTIN. 04/27/21   Lucky Cowboy, MD  magnesium oxide (MAG-OX) 400 MG tablet Take by mouth.    [provider]  MELATONIN PO Take 1 tablet by mouth as needed.    [provider]  metoprolol (TOPROL-XL) 200 MG 24 hr tablet Take 200 mg by mouth daily.    [provider]  Multiple Vitamin (MULTIVITAMIN) tablet Take 1 tablet by mouth daily. A-Z    [provider]  omeprazole (PRILOSEC) 20 MG capsule TAKE 1 CAPSULE TWICE DAILY  ( EVERY 12 HOURS  ) TO PREVENT  HEARTBURN AND ACID REFLUX 12/17/21   Lucky Cowboy, MD  predniSONE (STERAPRED UNI-PAK 21 TAB) 10 MG (21) TBPK tablet Take by mouth daily. Take 6 tabs by mouth daily  for 2 days, then 5 tabs for 2 days, then 4 tabs for 2 days, then 3 tabs for 2 days, 2 tabs for 2 days, then 1 tab by mouth daily for 2 days 08/27/22   Trevor Iha, FNP  Probiotic Product (PROBIOTIC-10 PO) Take by mouth daily.    [provider]  rosuvastatin (CRESTOR) 40 MG tablet Take 1 tablet (40 mg total) by mouth daily. 06/05/22   [name] Slick, MD  tamsulosin (FLOMAX) 0.4 MG CAPS capsule Take 1 tablet at Bedtime for Prostate & Bladder 04/30/22   [name] Slick, MD  torsemide (DEMADEX) 20 MG tablet Take 1 tablet every Morning for Swelling in legs 06/05/22   [name] Slick, MD  TURMERIC PO Take 1,000 mg by mouth daily.    [provider]  warfarin (COUMADIN) 5 MG tablet Take 1 tablet (5 mg total) by mouth daily. 06/13/22 09/11/22  [name] Slick, MD  Zinc 50 MG TABS Take by mouth.    [provider]    Physical Exam:  Physical Exam Vitals and nursing note reviewed.  Constitutional:      Appearance: Normal appearance.     Comments: Oriented to person and place but not to time.  Able to move all extremities Patient very fidgety and pulling off cardiac monitor  HENT:     Head: Normocephalic.     Nose: Nose normal.     Mouth/Throat:     Mouth: Mucous membranes  are dry.  Eyes:     Conjunctiva/sclera: Conjunctivae normal.  Cardiovascular:     Rate and Rhythm: Normal rate and regular rhythm.  Pulmonary:     Effort: Pulmonary effort is normal.     Breath sounds: Normal breath sounds.  Abdominal:     General: Abdomen is flat. Bowel sounds are normal.     Palpations: Abdomen is soft.  Musculoskeletal:        General: Normal range of motion.     Cervical back: Normal range of motion and neck supple.  Skin:    General: Skin is warm and dry.  Neurological:     Mental Status: He is alert.     Comments: Oriented to person and place but not to time.  Able to move all extremities  Psychiatric:        Mood and Affect: Mood normal.        Behavior: Behavior normal.     Data Reviewed: Relevant notes from primary care and specialist visits, past discharge summaries as available in EHR, including Care Everywhere. Prior diagnostic testing as pertinent to current admission diagnoses Updated medications and problem lists for reconciliation ED course, including vitals, labs, imaging, treatment and response to treatment Triage notes, nursing and pharmacy notes and ED provider's notes Notable results as noted in HPI Labs reviewed.  PT 21.7, INR 1.9, TSH 1.7, ammonia 23, magnesium 1.7, sodium 135, potassium 4.0, chloride 99, bicarb 31, glucose 121, BUN 12, creatinine 0.90, calcium 9.8 total protein 6.7, albumin 4.3, AST 37, ALT 53, alkaline phosphatase 71, total bilirubin 1.5, white count 13.1, 16 point hematocrit 48.5, platelet count 404 Twelve-lead EKG reviewed by me shows sinus rhythm with first-degree AV block There are no new results to review at this time.  Assessment and Plan: * Acute CVA (cerebrovascular accident) [facility]) Patient presented  to the ER for evaluation of confusion and had a CT scan of the head done without contrast which showed potentially acute or subacute infarct in the right frontal lobe.  Will obtain MRI of the brain for further  evaluation.  Patient has a pacemaker that is scheduled to be checked to make sure it is MRI compatible. Obtain CT angiogram of the head and neck to rule out LVO Obtain 2D echocardiogram to assess LVEF and rule out intra-atrial shunt as well as LV thrombus We will request PT/OT/ST consult Consult neurology Place patient on aspirin and high intensity statins Allow for permissive hypertension  Chronic diastolic CHF (congestive heart failure) (HCC) Hold torsemide and metoprolol to allow for permissive hypertension Last 2D echocardiogram from 11/23 showed an LVEF of 55 to 60%  Gout Continue allopurinol  Ischemic cardiomyopathy Status post AICD insertion Repeat 2D echocardiogram shows an improvement in his LVEF to 55 to 60%  S/P CABG x 3 History of coronary artery disease status post CABG  Continue aspirin, high intensity statin Hold nitrates and metoprolol to allow for permissive hypertension  Polycythemia vera (HCC) Continue Hydrea Follow-up with oncology as an outpatient  Hypothyroidism Continue Synthroid  Atrial fibrillation (HCC) Paroxysmal Hold metoprolol due to permissive hypertension Hold Coumadin until an acute stroke is ruled out   BPH (benign prostatic hyperplasia) Continue Flomax and finasteride  GERD  Continue PPI  HTN (hypertension) Will hold nitrates and metoprolol to allow for permissive hypertension      Advance Care Planning:   Code Status: Full Code   Consults: Neurology  Family Communication: Greater than 50% of time was spent discussing patient's condition and plan of care with his wife at the bedside.  All questions and concerns have been addressed.  She verbalizes understanding and agrees with the plan.  Severity of Illness: The appropriate patient status for this patient is INPATIENT. Inpatient status is judged to be reasonable and necessary in order to provide the required intensity of service to ensure the patient's safety. The patient's  presenting symptoms, physical exam findings, and initial radiographic and laboratory data in the context of their chronic comorbidities is felt to place them at high risk for further clinical deterioration. Furthermore, it is not anticipated that the patient will be medically stable for discharge from the hospital within 2 midnights of admission.   * I certify that at the point of admission it is my clinical judgment that the patient will require inpatient hospital care spanning beyond 2 midnights from the point of admission due to high intensity of service, high risk for further deterioration and high frequency of surveillance required.*  Author: Lucile Shutters, MD 09/16/2022 8:55 PM  For on call review [email or web address].

## 2022-09-16 NOTE — ED Notes (Signed)
Wife present while MSE waiver was signed.

## 2022-09-16 NOTE — ED Provider Notes (Signed)
[place] EMERGENCY DEPARTMENT AT [facility] Provider Note   CSN: [id number] Arrival date & time: 09/16/22  1355     History  Chief Complaint  Patient presents with   Altered Mental Status    [name] [name] is a 82 y.o. male.  HPI Patient presents for altered mental status.  Medical history includes HTN, HLD, GERD, BPH, atrial fibrillation, hypothyroidism, COPD, OSA, polycythemia vera, CAD, CHF.  2 days ago, patient was in his normal state of health.  He and his wife had family over and they have a good day.  That night, he slept a lot.  Yesterday, his wife noticed confusion and increased somnolence.  This has persisted today.  He went to see his oncologist who instructed patient and wife to come to the ED for evaluation of his acute confusion.  Patient has not undergone any recent medication changes.  Patient denies any current areas of discomfort.  He has not had any trouble with ambulating.  He was able to drive to the ED with his wife helping with directions.  He has not had any recent traumas.  He did have a fall earlier today but it was essentially him lowering himself to the ground and he did not strike his head.  Home medications include Synthroid and warfarin.    Home Medications Prior to Admission medications   Medication Sig Start Date End Date Taking? Authorizing Provider  acetaminophen (TYLENOL) 500 MG tablet Take 500 mg by mouth as needed for mild pain.    [provider]  allopurinol (ZYLOPRIM) 300 MG tablet TAKE 1 TABLET EVERY DAY TO PREVENT GOUT 04/30/22   Suzan Slick, MD  aspirin EC 81 MG tablet Take 81 mg by mouth daily.    [provider]  Biotin 40981 MCG TABS Take by mouth.    [provider]  Cyanocobalamin (B-12) 1000 MCG CAPS Take by mouth.    [provider]  Docosahexaenoic Acid (DHA OMEGA 3 PO) Take by mouth. 1000 mg    [provider]  docusate sodium (COLACE) 100 MG capsule Take 100 mg by  mouth 2 (two) times daily. 1 tablet in the morning and 1 tablet at bedtime (stool softener)    [provider]  ezetimibe (ZETIA) 10 MG tablet Take 1 tab daily at night for cholesterol. 10/17/21   Judd Gaudier, NP  finasteride (PROSCAR) 5 MG tablet TAKE 1 TABLET EVERY DAY FOR PROSTATE 09/16/21   Lucky Cowboy, MD  hydroxyurea (HYDREA) 500 MG capsule TAKE 2 CAPSULES ONE TIME DAILY WITH FOOD 07/22/22   Johney Maine, MD  isosorbide mononitrate (IMDUR) 30 MG 24 hr tablet Take 1 tablet (30 mg total) by mouth daily. Take by mouth daily. 06/05/22   Suzan Slick, MD  levothyroxine (SYNTHROID) 200 MCG tablet TAKE 1 TAB DAILY ON EMPTY STOMACH WITH ONLY WATER FOR 30 MINS. NO ANTACIDS, CALCIUM OR MAGNESIUM FOR 4 HOURS. AVOID BIOTIN. Patient taking differently: TAKE 1/2 TAB on Mon, Wed, Friday and take 1 whole tablet on all other days ON EMPTY STOMACH WITH ONLY WATER FOR 30 MINS. NO ANTACIDS, CALCIUM OR MAGNESIUM FOR 4 HOURS. AVOID BIOTIN. 04/27/21   Lucky Cowboy, MD  magnesium oxide (MAG-OX) 400 MG tablet Take by mouth.    [provider]  MELATONIN PO Take 1 tablet by mouth as needed.    [provider]  metoprolol (TOPROL-XL) 200 MG 24 hr tablet Take 200 mg by mouth daily.    [provider]  Multiple Vitamin (MULTIVITAMIN) tablet Take 1 tablet by mouth daily. A-Z    [provider]  omeprazole (PRILOSEC) 20 MG capsule TAKE 1 CAPSULE TWICE DAILY  ( EVERY 12 HOURS  ) TO PREVENT HEARTBURN AND ACID REFLUX 12/17/21   Lucky Cowboy, MD  predniSONE (STERAPRED UNI-PAK 21 TAB) 10 MG (21) TBPK tablet Take by mouth daily. Take 6 tabs by mouth daily  for 2 days, then 5 tabs for 2 days, then 4 tabs for 2 days, then 3 tabs for 2 days, 2 tabs for 2 days, then 1 tab by mouth daily for 2 days 08/27/22   Trevor Iha, FNP  Probiotic Product (PROBIOTIC-10 PO) Take by mouth daily.    [provider]  rosuvastatin (CRESTOR) 40 MG tablet Take 1 tablet (40 mg  total) by mouth daily. 06/05/22   Suzan Slick, MD  tamsulosin (FLOMAX) 0.4 MG CAPS capsule Take 1 tablet at Bedtime for Prostate & Bladder 04/30/22   Suzan Slick, MD  torsemide (DEMADEX) 20 MG tablet Take 1 tablet every Morning for Swelling in legs 06/05/22   Suzan Slick, MD  TURMERIC PO Take 1,000 mg by mouth daily.    [provider]  warfarin (COUMADIN) 5 MG tablet Take 1 tablet (5 mg total) by mouth daily. 06/13/22 09/11/22  Suzan Slick, MD  Zinc 50 MG TABS Take by mouth.    [provider]      Allergies    Other    Review of Systems   Review of Systems  Psychiatric/Behavioral:  Positive for confusion.   All other systems reviewed and are negative.   Physical Exam Updated Vital Signs BP (!) 115/91 (BP Location: Left Arm)   Pulse 76   Temp 97.6 F (36.4 C) (Oral)   Resp 18   SpO2 100%  Physical Exam Vitals and nursing note reviewed.  Constitutional:      General: He is not in acute distress.    Appearance: Normal appearance. He is well-developed. He is not ill-appearing, toxic-appearing or diaphoretic.  HENT:     Head: Normocephalic and atraumatic.     Right Ear: External ear normal.     Left Ear: External ear normal.     Nose: Nose normal.     Mouth/Throat:     Mouth: Mucous membranes are moist.  Eyes:     Extraocular Movements: Extraocular movements intact.     Conjunctiva/sclera: Conjunctivae normal.  Cardiovascular:     Rate and Rhythm: Normal rate and regular rhythm.  Pulmonary:     Effort: Pulmonary effort is normal. No respiratory distress.  Chest:     Chest wall: No tenderness.  Abdominal:     General: There is no distension.     Palpations: Abdomen is soft.     Tenderness: There is no abdominal tenderness.  Musculoskeletal:        General: No swelling. Normal range of motion.     Cervical back: Normal range of motion and neck supple.     Right lower leg: No edema.     Left lower leg: No edema.  Skin:     General: Skin is warm and dry.     Capillary Refill: Capillary refill takes less than 2 seconds.     Coloration: Skin is not jaundiced or pale.  Neurological:     General: No focal deficit present.     Mental Status: He is alert. He is disoriented.     Cranial  Nerves: No cranial nerve deficit.     Sensory: No sensory deficit.     Motor: No weakness.     Coordination: Coordination normal.  Psychiatric:        Mood and Affect: Mood normal.        Behavior: Behavior normal.     ED Results / Procedures / Treatments   Labs (all labs ordered are listed, but only abnormal results are displayed) Labs Reviewed  URINALYSIS, ROUTINE W REFLEX MICROSCOPIC  AMMONIA  MAGNESIUM  BLOOD GAS, VENOUS  TSH  CBG MONITORING, ED    EKG None  Radiology No results found.  Procedures Procedures    Medications Ordered in ED Medications - No data to display  ED Course/ Medical Decision Making/ A&P                             Medical Decision Making Amount and/or Complexity of Data Reviewed Labs: ordered. Radiology: ordered.   This patient presents to the ED for concern of altered mental status, this involves an extensive number of treatment options, and is a complaint that carries with it a high risk of complications and morbidity.  The differential diagnosis includes polypharmacy, ICH, CVA, metabolic derangements, neoplasm, age-related cognitive change   Co morbidities that complicate the patient evaluation  HTN, HLD, GERD, BPH, atrial fibrillation, hypothyroidism, COPD, OSA, polycythemia vera, CAD, CHF   Additional history obtained:  Additional history obtained from patient's wife External records from outside source obtained and reviewed including EMR   Lab Tests:  I Ordered, and personally interpreted labs.  The pertinent results include: Lab work from oncology visit earlier today was reviewed.  Results of this lab work shows baseline leukocytosis, baseline macrocytosis,  hide-normal hemoglobin, high-normal platelets; normal kidney function, normal electrolytes.  Lab work in the ED is pending at time of signout.   Imaging Studies ordered:  I ordered imaging studies including CT head I independently visualized and interpreted imaging which showed (pending at time of signout) I agree with the radiologist interpretation  Problem List / ED Course / Critical interventions / Medication management  Patient presents for altered mental status.  This has been present over the past 2 days.  On arrival in the ED, patient is well-appearing.  He has been ambulating without difficulty.  He is accompanied at bedside by his wife, who provides history.  Patient is currently disoriented.  He cannot tell me the year or the current city that he is in.  He does not have any focal neurologic deficits on exam.  He currently has no physical complaints.  Workup was initiated to identify etiology of his acute confusion.  MRI was initially ordered.  Unfortunately, due to his current pacemaker, he is unable to undergo MRI at this time.  At time of signout, lab work and CT head were pending.  Care of patient was signed out to oncoming ED provider.   Social Determinants of Health:  Has access to outpatient care         Final Clinical Impression(s) / ED Diagnoses Final diagnoses:  Confusion    Rx / DC Orders ED Discharge Orders     None         Gloris Manchester, MD 09/16/22 1453

## 2022-09-16 NOTE — Assessment & Plan Note (Signed)
Paroxysmal Hold metoprolol due to permissive hypertension Hold Coumadin until an acute stroke is ruled out

## 2022-09-16 NOTE — ED Provider Notes (Signed)
Received patient in turnover from Dr. Durwin Nora.  Please see their note for further details of Hx, PE.  Briefly patient is a 82 y.o. male with a Altered Mental Status .  Patient with altered mental status.  Confusion since yesterday afternoon.  Per the wife not talking as much and does not seem to know what is going on.  Was unable to work a TV remote.  Plan for blood work and CT of the head.  CT scan of the head is concerning for a acute versus subacute infarct.  On my exam he has some left upper extremity and left lower extremity weakness.  Is confused but has no obvious aphasia.  Will discuss with neurology..  I discussed case with Dr. Iver Nestle, she was concerned for right MCA stroke.  Recommended MRI head and CT angiogram of the head and neck and medical admission.  She recommended holding his anticoagulation and once his INR is less than 1.7 recommended full dose aspirin.  I discussed with hospitalist for admission.  The patients results and plan were reviewed and discussed.   Any x-rays performed were independently reviewed by myself.   Differential diagnosis were considered with the presenting HPI.  Medications  acetaminophen (TYLENOL) tablet 500 mg (has no administration in time range)  allopurinol (ZYLOPRIM) tablet 300 mg (has no administration in time range)  aspirin EC tablet 81 mg (has no administration in time range)  hydroxyurea (HYDREA) capsule 500 mg (has no administration in time range)  ezetimibe (ZETIA) tablet 10 mg (has no administration in time range)  rosuvastatin (CRESTOR) tablet 40 mg (has no administration in time range)  levothyroxine (SYNTHROID) tablet 200 mcg (has no administration in time range)  docusate sodium (COLACE) capsule 100 mg (has no administration in time range)  magnesium oxide (MAG-OX) tablet 400 mg (has no administration in time range)  pantoprazole (PROTONIX) EC tablet 40 mg (has no administration in time range)  finasteride (PROSCAR) tablet 5 mg (has no  administration in time range)  tamsulosin (FLOMAX) capsule 0.4 mg (has no administration in time range)  B-12 CAPS (has no administration in time range)  multivitamin tablet 1 tablet (has no administration in time range)   stroke: early stages of recovery book (has no administration in time range)  iohexol (OMNIPAQUE) 350 MG/ML injection 75 mL (75 mLs Intravenous Contrast Given 09/16/22 2109)    Vitals:   09/16/22 1945 09/16/22 2000 09/16/22 2030 09/16/22 2100  BP: (!) 168/82 (!) 167/78    Pulse: 71 80 79 71  Resp: 20 (!) 21    Temp:      TempSrc:      SpO2: 95% 100% 100% 100%    Final diagnoses:  Confusion    Admission/ observation were discussed with the admitting physician, patient and/or family and they are comfortable with the plan.      Melene Plan, DO 09/16/22 2135

## 2022-09-16 NOTE — Assessment & Plan Note (Signed)
Status post AICD insertion Repeat 2D echocardiogram shows an improvement in his LVEF to 55 to 60%

## 2022-09-17 ENCOUNTER — Other Ambulatory Visit (HOSPITAL_COMMUNITY): Payer: Self-pay

## 2022-09-17 ENCOUNTER — Inpatient Hospital Stay (HOSPITAL_COMMUNITY): Payer: Medicare Other

## 2022-09-17 ENCOUNTER — Encounter: Payer: Self-pay | Admitting: Hematology

## 2022-09-17 ENCOUNTER — Telehealth (HOSPITAL_COMMUNITY): Payer: Self-pay | Admitting: Pharmacy Technician

## 2022-09-17 DIAGNOSIS — I6389 Other cerebral infarction: Secondary | ICD-10-CM

## 2022-09-17 DIAGNOSIS — I639 Cerebral infarction, unspecified: Secondary | ICD-10-CM | POA: Diagnosis not present

## 2022-09-17 DIAGNOSIS — K219 Gastro-esophageal reflux disease without esophagitis: Secondary | ICD-10-CM | POA: Diagnosis not present

## 2022-09-17 DIAGNOSIS — D45 Polycythemia vera: Secondary | ICD-10-CM

## 2022-09-17 DIAGNOSIS — I482 Chronic atrial fibrillation, unspecified: Secondary | ICD-10-CM

## 2022-09-17 DIAGNOSIS — I480 Paroxysmal atrial fibrillation: Secondary | ICD-10-CM | POA: Diagnosis not present

## 2022-09-17 DIAGNOSIS — E039 Hypothyroidism, unspecified: Secondary | ICD-10-CM

## 2022-09-17 DIAGNOSIS — I5032 Chronic diastolic (congestive) heart failure: Secondary | ICD-10-CM

## 2022-09-17 DIAGNOSIS — I10 Essential (primary) hypertension: Secondary | ICD-10-CM

## 2022-09-17 LAB — HEMOGLOBIN A1C
Hgb A1c MFr Bld: 5.6 % (ref 4.8–5.6)
Mean Plasma Glucose: 114.02 mg/dL

## 2022-09-17 LAB — ECHOCARDIOGRAM COMPLETE
AR max vel: 1.2 cm2
AV Area VTI: 1.24 cm2
AV Area mean vel: 1.11 cm2
AV Mean grad: 25 mmHg
AV Peak grad: 38.7 mmHg
Ao pk vel: 3.11 m/s
Area-P 1/2: 4.68 cm2
Height: 67 in
S' Lateral: 3.5 cm
Single Plane A4C EF: 50.6 %
Weight: 3534.41 oz

## 2022-09-17 LAB — LIPID PANEL
Cholesterol: 147 mg/dL (ref 0–200)
HDL: 23 mg/dL — ABNORMAL LOW (ref >40)
LDL Cholesterol: 100 mg/dL — ABNORMAL HIGH (ref 0–99)
Total CHOL/HDL Ratio: 6.4 RATIO
Triglycerides: 122 mg/dL (ref <150)
VLDL: 24 mg/dL (ref 0–40)

## 2022-09-17 MED ORDER — MELATONIN 5 MG PO TABS
5.0000 mg | ORAL_TABLET | Freq: Every evening | ORAL | Status: DC | PRN
  Administered 2022-09-17: 5 mg via ORAL
  Filled 2022-09-17: qty 1

## 2022-09-17 MED ORDER — APIXABAN 5 MG PO TABS
5.0000 mg | ORAL_TABLET | Freq: Two times a day (BID) | ORAL | Status: DC
  Administered 2022-09-17 – 2022-09-18 (×2): 5 mg via ORAL
  Filled 2022-09-17 (×2): qty 1

## 2022-09-17 NOTE — Progress Notes (Addendum)
Occupational Therapy Treatment Note    09/17/22 1600  OT Visit Information  Last OT Received On 09/17/22  Assistance Needed +1  History of Present Illness Pt is an 82 y.o. male who presented 09/16/22 with AMS. CT showed potentially acute or subacute infarct in the right frontal lobe. PMH: mild cognitive impairment, ischemic cardiomyopathy and coronary artery disease s/p CABG x 3 (2015), pulmonary hypertension, CHF, left bundle branch block, prediabetes, HLD, HTN, mixed central and OSA intolerant of CPAP, COPD, hypothyroidism, lumbar disc disease s/p fusion, polycythemia vera (JAK2 positive on hydroxyurea)  Precautions  Precautions Fall  Pain Assessment  Pain Assessment Faces  Faces Pain Scale 2  Pain Location L knee chronic pain  Pain Descriptors / Indicators Discomfort  Cognition  Memory Decreased short-term memory  Following Commands Follows one step commands with increased time  Problem Solving Slow processing  General Comments Did not remember this therapist from being in the room 30 minutes prior to his return from CT. Unable to recall information regarding why he is in the hospital. Difficulty naming 2/4 objects however able to decribe and demonstrate their use. Tangential at times. will continue to assess; difficulty with non-declarative memory - would benefit form usingerrorless learning strategies  Upper Extremity Assessment  Upper Extremity Assessment LUE deficits/detail  LUE Deficits / Details using functionally; mild coordination differences  Vision- Assessment  Vision Assessment? Vision impaired- to be further tested in functional context  Additional Comments pt squinting at times however does not wear glasses at al times; pursuits  are slow and has difficulty maintaining gaze on target - poor visual attention  ADL  Overall ADL's  Needs assistance/impaired  Functional mobility during ADLs Min guard;Rolling walker (2 wheels)  General ADL Comments min Guard with LB ADL, S with   UB ADL; became distracted when brushing teeht, however abl eot complete task with VC  Bed Mobility  General bed mobility comments S sit - supine  OT - End of Session  Activity Tolerance Patient tolerated treatment well  Patient left in bed;with call bell/phone within reach;with bed alarm set  Nurse Communication Mobility status  OT Assessment/Plan  OT Plan Discharge plan remains appropriate  OT Visit Diagnosis Unsteadiness on feet (R26.81);Muscle weakness (generalized) (M62.81);Other symptoms and signs involving cognitive function  OT Frequency (ACUTE ONLY) Min 2X/week  Follow Up Recommendations Home health OT  Assistance recommended at discharge Frequent or constant Supervision/Assistance  Patient can return home with the following Assist for transportation;Direct supervision/assist for financial management;Direct supervision/assist for medications management;Assistance with cooking/housework  OT Equipment BSC/3in1  AM-PAC OT "6 Clicks" Daily Activity Outcome Measure (Version 2)  Help from another person eating meals? 3  Help from another person taking care of personal grooming? 3  Help from another person toileting, which includes using toliet, bedpan, or urinal? 3  Help from another person bathing (including washing, rinsing, drying)? 3  Help from another person to put on and taking off regular upper body clothing? 3  Help from another person to put on and taking off regular lower body clothing? 3  6 Click Score 18  Progressive Mobility  What is the highest level of mobility based on the progressive mobility assessment? Level 5 (Walks with assist in room/hall) - Balance while stepping forward/back and can walk in room with assist - Complete  Mobility Referral Yes  Activity Ambulated with assistance in room  OT Goal Progression  Progress towards OT goals Progressing toward goals  Acute Rehab OT Goals  Patient Stated Goal none  stated  OT Goal Formulation With patient/family  Time For  Goal Achievement 10/01/22  Potential to Achieve Goals Good  OT Time Calculation  OT Start Time (ACUTE ONLY) 1455  OT Stop Time (ACUTE ONLY) 1510  OT Time Calculation (min) 15 min  OT General Charges  $OT Visit 1 Visit  OT Treatments  $Self Care/Home Management  8-22 mins   Luisa Dago, OT/L   Acute OT Clinical Specialist Acute Rehabilitation Services Pager [phone] Office [phone]

## 2022-09-17 NOTE — Progress Notes (Signed)
[facility]                                                                               Lynn Sissel, is a 82 y.o. male, DOB - 10-Feb-1941, ZOX:[id number] Admit date - 09/16/2022    Outpatient Primary MD for the patient is Wyline Mood, Magdalen Spatz, MD  LOS - 1  days    Brief summary   [name] [name] is a 82 y.o. male with medical history significant for atrial fibrillation on chronic anticoagulation therapy with Coumadin, chronic diastolic dysfunction CHF status post AICD insertion, history of coronary artery disease status post CABG, COPD, hypertension, dyslipidemia, sleep apnea who was referred to the emergency room by his oncologist for evaluation of mental status changes.    Assessment & Plan    Assessment and Plan: * Acute CVA (cerebrovascular accident) [facility]) Patient presented to the ER for evaluation of confusion and had a CT scan of the head done without contrast which showed potentially acute or subacute infarct in the right frontal lobe.  Unable to do MRI due to incompatibility.  Repeat CT head showed evolution of the stroke without any intracranial hemorrhage.   Neurology consulted, changed the coumadin to eliquis. Added aspirin 81 mg daily.  Currently on zetia and crestor 40 mg daily.   Chronic diastolic CHF (congestive heart failure) (HCC) Hold torsemide and metoprolol to allow for permissive hypertension Last 2D echocardiogram from 11/23 showed an LVEF of 55 to 60%.   Gout Continue allopurinol  Ischemic cardiomyopathy Status post AICD insertion Repeat 2D echocardiogram shows an improvement in his LVEF to 55 to 60%  S/P CABG x 3 History of coronary artery disease status post CABG  Continue aspirin, high intensity statin Hold nitrates and metoprolol to allow for permissive hypertension  Polycythemia vera (HCC) Continue Hydrea Follow-up with oncology as an outpatient  Hypothyroidism Continue Synthroid 200 mcg daily.   Atrial fibrillation  (HCC) Paroxysmal, currently rate controlled.  Will restart metoprolol in the next 24 hours.  Coumadin discontinued and changed to Eliquis   BPH (benign prostatic hyperplasia) Continue Flomax and finasteride  GERD  Continue PPI  HTN (hypertension) Allow for permissive hypertension.  Metoprolol and nitrates are on hold for now.   Acute encephalopathy: from Acute CVA . He is slow to respond, with slow speech and memory deficits.  Continue to monitor.     Estimated body mass index is 34.6 kg/m as calculated from the following:   Height as of this encounter: 5\' 7"  (1.702 m).   Weight as of this encounter: 100.2 kg.  Code Status:full code.  DVT Prophylaxis:  SCD's Start: 09/16/22 2032   Level of Care: Level of care: Telemetry Medical Family Communication: Updated patient's wife at bedside.   Disposition Plan:     Remains inpatient appropriate:  pending further evaluation.   Procedures:  Repeat CT HEAD.   Consultants:   Neurology.   Antimicrobials:   Anti-infectives (From admission, onward)    None        Medications  Scheduled Meds:  allopurinol  300 mg Oral Daily   aspirin EC  81 mg Oral Daily   cyanocobalamin  1,000 mcg Oral Daily  docusate sodium  100 mg Oral BID   ezetimibe  10 mg Oral Daily   finasteride  5 mg Oral Daily   hydroxyurea  1,000 mg Oral Daily   levothyroxine  200 mcg Oral Q0600   magnesium oxide  400 mg Oral Daily   multivitamin with minerals  1 tablet Oral Daily   pantoprazole  40 mg Oral Daily   rosuvastatin  40 mg Oral Daily   tamsulosin  0.4 mg Oral Daily   Continuous Infusions: PRN Meds:.acetaminophen, melatonin    Subjective:   [name] [name] was seen and examined today.  No new events overnight.   Objective:   Vitals:   09/17/22 0143 09/17/22 0718 09/17/22 1143 09/17/22 1514  BP: (!) 147/68 116/61 127/67 129/71  Pulse: 75 75 71 75  Resp: 20 (!) 23 16 19   Temp: 98.2 F (36.8 C) 99.1 F (37.3 C) 98.7 F (37.1 C)  97.9 F (36.6 C)  TempSrc: Oral Oral Oral Oral  SpO2: 96% 93% 97%   Weight:      Height:        Intake/Output Summary (Last 24 hours) at 09/17/2022 1657 Last data filed at 09/17/2022 0413 Gross per 24 hour  Intake --  Output 1 ml  Net -1 ml   Filed Weights   09/16/22 2240  Weight: 100.2 kg     Exam General exam: elderly frail , gentleman, not in distress.  Respiratory system: Clear to auscultation. Respiratory effort normal. Cardiovascular system: S1 & S2 heard, RRR. No JVD,  Gastrointestinal system: Abdomen is nondistended, soft and nontender. Central nervous system: lethargic, but opens eyes to verbal cues and answers simple questions.  Has memory deficits.  Extremities: chronic left foot ulcer Skin: No rashes,  Psychiatry: calm , no agitation.   Data Reviewed:  I have personally reviewed following labs and imaging studies   CBC Lab Results  Component Value Date   WBC 13.1 (H) 09/16/2022   RBC 4.34 09/16/2022   HGB 16.1 09/16/2022   HCT 48.5 09/16/2022   MCV 111.8 (H) 09/16/2022   MCH 37.1 (H) 09/16/2022   PLT 404 (H) 09/16/2022   MCHC 33.2 09/16/2022   RDW 13.9 09/16/2022   LYMPHSABS 0.8 09/16/2022   MONOABS 0.5 09/16/2022   EOSABS 0.0 09/16/2022   BASOSABS 0.1 09/16/2022     Last metabolic panel Lab Results  Component Value Date   NA 135 09/16/2022   K 4.0 09/16/2022   CL 99 09/16/2022   CO2 31 09/16/2022   BUN 12 09/16/2022   CREATININE 0.90 09/16/2022   GLUCOSE 121 (H) 09/16/2022   GFRNONAA >60 09/16/2022   GFRAA 76 09/27/2020   CALCIUM 9.8 09/16/2022   PROT 6.7 09/16/2022   ALBUMIN 4.3 09/16/2022   BILITOT 1.5 (H) 09/16/2022   ALKPHOS 71 09/16/2022   AST 37 09/16/2022   ALT 53 (H) 09/16/2022   ANIONGAP 5 09/16/2022    CBG (last 3)  Recent Labs    09/16/22 1442  GLUCAP 126*      Coagulation Profile: Recent Labs  Lab 09/16/22 1545  INR 1.9*     Radiology Studies: CT HEAD WO CONTRAST ( )  Result Date: 09/17/2022 CLINICAL  DATA:  Stroke, follow-up. EXAM: CT HEAD WITHOUT CONTRAST TECHNIQUE: Contiguous axial images were obtained from the base of the skull through the vertex without intravenous contrast. RADIATION DOSE REDUCTION: This exam was performed according to the departmental dose-optimization program which includes automated exposure control, adjustment of the mA and/or kV according to  patient size and/or use of iterative reconstruction technique. COMPARISON:  Head CT and CTA head/neck 09/16/2022. FINDINGS: Brain: Continued evolution of the previously described infarct in the right frontal operculum. No new infarct is seen. No acute intracranial hemorrhage. No hydrocephalus or extra-axial collection. No mass effect or midline shift. Vascular: No new hyperdense vessel or unexpected calcification. Skull: No calvarial fracture or suspicious bone lesion. Skull base is unremarkable. Sinuses/Orbits: Unchanged chronic right sphenoid sinusitis. Orbits are unremarkable. Other: None. IMPRESSION: Continued evolution of the previously described infarct in the right frontal operculum. No new infarct or acute intracranial hemorrhage. Electronically Signed   By: Orvan Falconer M.D.   On: 09/17/2022 15:01   ECHOCARDIOGRAM COMPLETE  Result Date: 09/17/2022    ECHOCARDIOGRAM REPORT   Patient Name:   [name] [name] Date of Exam: 09/17/2022 Medical Rec #:  [id number]        Height:       67.0 in Accession #:    [id number]       Weight:       220.9 lb Date of Birth:  [birth year]         BSA:          2.110 m Patient Age:    81 years         BP:           0/0 mmHg Patient Gender: M                HR:           88 bpm. Exam Location:  Inpatient Procedure: 2D Echo Indications:    Stroke  History:        Patient has no prior history of Echocardiogram examinations.  Sonographer:    Data processing manager Referring Phys: [id number] Marvel Plan IMPRESSIONS  1. Left ventricular ejection fraction, by estimation, is 50 to 55%. The left ventricle has low  normal function. The left ventricle has no regional wall motion abnormalities. There is moderate concentric left ventricular hypertrophy. Left ventricular diastolic parameters are consistent with Grade I diastolic dysfunction (impaired relaxation).  2. Right ventricular systolic function is normal. The right ventricular size is severely enlarged. There is normal pulmonary artery systolic pressure. The estimated right ventricular systolic pressure is 31.6 mmHg.  3. Left atrial size was severely dilated.  4. Right atrial size was severely dilated.  5. The mitral valve is degenerative. Trivial mitral valve regurgitation.  6. Decreased LV stroke volume index. DVI 0.33. Suspect moderate low flow low gradient aortic stenosis. The aortic valve is calcified. Aortic valve regurgitation is mild. Moderate aortic valve stenosis. Aortic valve mean gradient measures 25.0 mmHg. Aortic valve Vmax measures 3.11 m/s.  7. The inferior vena cava is dilated in size with <50% respiratory variability, suggesting right atrial pressure of 15 mmHg. Comparison(s): No prior Echocardiogram. FINDINGS  Left Ventricle: Left ventricular ejection fraction, by estimation, is 50 to 55%. The left ventricle has low normal function. The left ventricle has no regional wall motion abnormalities. The left ventricular internal cavity size was normal in size. There is moderate concentric left ventricular hypertrophy. Abnormal (paradoxical) septal motion, consistent with RV pacemaker. Left ventricular diastolic parameters are consistent with Grade I diastolic dysfunction (impaired relaxation). Right Ventricle: The right ventricular size is severely enlarged. No increase in right ventricular wall thickness. Right ventricular systolic function is normal. There is normal pulmonary artery systolic pressure. The tricuspid regurgitant velocity is 2.04 m/s, and with an assumed right atrial pressure of 15 mmHg, the  estimated right ventricular systolic pressure is 31.6  mmHg. Left Atrium: Left atrial size was severely dilated. Right Atrium: Right atrial size was severely dilated. Pericardium: There is no evidence of pericardial effusion. Mitral Valve: The mitral valve is degenerative in appearance. Trivial mitral valve regurgitation. Tricuspid Valve: The tricuspid valve is normal in structure. Tricuspid valve regurgitation is mild . No evidence of tricuspid stenosis. Aortic Valve: Decreased LV stroke volume index. DVI 0.33. Suspect moderate low flow low gradient aortic stenosis. The aortic valve is calcified. Aortic valve regurgitation is mild. Moderate aortic stenosis is present. Aortic valve mean gradient measures 25.0 mmHg. Aortic valve peak gradient measures 38.7 mmHg. Aortic valve area, by VTI measures 1.24 cm. Pulmonic Valve: The pulmonic valve was not well visualized. Pulmonic valve regurgitation is not visualized. No evidence of pulmonic stenosis. Aorta: The aortic root is normal in size and structure. Venous: The inferior vena cava is dilated in size with less than 50% respiratory variability, suggesting right atrial pressure of 15 mmHg. IAS/Shunts: No atrial level shunt detected by color flow Doppler. Additional Comments: A device lead is visualized in the right ventricle and right atrium.  LEFT VENTRICLE PLAX 2D LVIDd:         4.60 cm      Diastology LVIDs:         3.50 cm      LV e' medial:    9.03 cm/s LV PW:         1.30 cm      LV E/e' medial:  13.5 LV IVS:        1.30 cm      LV e' lateral:   10.80 cm/s LVOT diam:     2.20 cm      LV E/e' lateral: 11.3 LV SV:         78 LV SV Index:   37 LVOT Area:     3.80 cm  LV Volumes (MOD) LV vol d, MOD A4C: 107.0 ml LV vol s, MOD A4C: 52.9 ml LV SV MOD A4C:     107.0 ml RIGHT VENTRICLE             IVC RV S prime:     16.40 cm/s  IVC diam: 3.20 cm TAPSE (M-mode): 1.3 cm LEFT ATRIUM              Index        RIGHT ATRIUM           Index LA Vol (A2C):   107.0 ml 50.72 ml/m  RA Area:     36.80 cm LA Vol (A4C):   106.0 ml 50.25  ml/m  RA Volume:   149.00 ml 70.63 ml/m LA Biplane Vol: 115.0 ml 54.52 ml/m  AORTIC VALVE AV Area (Vmax):    1.20 cm AV Area (Vmean):   1.11 cm AV Area (VTI):     1.24 cm AV Vmax:           311.00 cm/s AV Vmean:          222.500 cm/s AV VTI:            0.626 m AV Peak Grad:      38.7 mmHg AV Mean Grad:      25.0 mmHg LVOT Vmax:         97.90 cm/s LVOT Vmean:        65.100 cm/s LVOT VTI:          0.204 m LVOT/AV VTI ratio: 0.33  AORTA Ao Root diam: 3.50 cm MITRAL VALVE                TRICUSPID VALVE MV Area (PHT): 4.68 cm     TR Peak grad:   16.6 mmHg MV E velocity: 121.50 cm/s  TR Vmax:        204.00 cm/s MV A velocity: 31.10 cm/s MV E/A ratio:  3.91         SHUNTS                             Systemic VTI:  0.20 m                             Systemic Diam: 2.20 cm Riley Lam MD Electronically signed by Riley Lam MD Signature Date/Time: 09/17/2022/1:18:14 PM    Final    CT ANGIO HEAD NECK W WO CM  Result Date: 09/16/2022 CLINICAL DATA:  Neuro deficit, acute, stroke suspected. Altered mental status. Right frontal infarct on CT today. EXAM: CT ANGIOGRAPHY HEAD AND NECK WITH AND WITHOUT CONTRAST TECHNIQUE: Multidetector CT imaging of the head and neck was performed using the standard protocol during bolus administration of intravenous contrast. Multiplanar CT image reconstructions and MIPs were obtained to evaluate the vascular anatomy. Carotid stenosis measurements (when applicable) are obtained utilizing NASCET criteria, using the distal internal carotid diameter as the denominator. RADIATION DOSE REDUCTION: This exam was performed according to the departmental dose-optimization program which includes automated exposure control, adjustment of the mA and/or kV according to patient size and/or use of iterative reconstruction technique. CONTRAST:  75mL OMNIPAQUE IOHEXOL 350 MG/ML SOLN COMPARISON:  None Available. FINDINGS: CTA NECK FINDINGS Aortic arch: Standard 3 vessel aortic arch with  mild atherosclerotic plaque. No significant stenosis of the arch vessel origins. Right carotid system: Patent with extensive calcified and soft plaque at the carotid bifurcation and in the proximal ICA resulting in up to 55% stenosis of the proximal ICA. Tortuous mid cervical ICA. Left carotid system: Patent with scattered calcified and soft plaque in the mid common carotid artery without significant associated stenosis. More prominent calcified plaque at the carotid bifurcation and in the proximal ICA resulting in 60% stenosis of the proximal ICA. Tortuous distal cervical ICA. Vertebral arteries: Patent without evidence of significant stenosis or dissection. Skeleton: Advanced cervical disc and facet degeneration. Other neck: No evidence of cervical lymphadenopathy or mass. 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 Upper chest: No mass or consolidation in the included lung apices. Review of the MIP images confirms the above findings CTA HEAD FINDINGS Anterior circulation: The internal carotid arteries are patent from skull base to carotid termini with mild atherosclerotic plaque not resulting in significant stenosis. The right M1 segment is widely patent, however there is a severe stenosis or filling defect/subocclusive embolus involving a right M2 superior division branch vessel corresponding to the infarct on today's earlier CT (series 7, image 230 and series 11, image 22). There is attenuation of more distal right MCA branch vessels. The left MCA and both ACAs are patent without evidence of a significant proximal stenosis. No aneurysm is identified. Posterior circulation: The intracranial vertebral arteries are patent to the basilar with the right being moderately dominant. Calcified atherosclerosis results in at most mild right V4 stenosis. Patent AICA and SCA origins are seen bilaterally. The basilar artery is widely patent. There are small posterior communicating arteries bilaterally. Both PCAs are  patent without evidence of a significant proximal stenosis on the right. There is a severe left P1 stenosis. No aneurysm is identified. Venous sinuses: Patent. Anatomic variants: None. Review of the MIP images confirms the above findings IMPRESSION: 1. Suspected subocclusive embolus in a right M2 superior division branch vessel corresponding to the infarct on today's earlier head CT. 2. Severe left P1 stenosis. 3. 55% proximal right ICA stenosis. 4. 60% proximal left ICA stenosis. 5.  Aortic Atherosclerosis (ICD10-I70.0). Electronically Signed   By: Sebastian Ache M.D.   On: 09/16/2022 22:03   DG Chest Port 1 View  Result Date: 09/16/2022 CLINICAL DATA:  Altered mental status EXAM: PORTABLE CHEST 1 VIEW COMPARISON:  10/26/2019 FINDINGS: Post sternotomy changes. Left-sided pacing device as before. Cardiomegaly. No acute airspace disease, pleural effusion or pneumothorax. IMPRESSION: No active disease. Cardiomegaly. Electronically Signed   By: Jasmine Pang M.D.   On: 09/16/2022 19:39   CT HEAD WO CONTRAST  Result Date: 09/16/2022 CLINICAL DATA:  Mental status change, unknown cause EXAM: CT HEAD WITHOUT CONTRAST TECHNIQUE: Contiguous axial images were obtained from the base of the skull through the vertex without intravenous contrast. RADIATION DOSE REDUCTION: This exam was performed according to the departmental dose-optimization program which includes automated exposure control, adjustment of the mA and/or kV according to patient size and/or use of iterative reconstruction technique. COMPARISON:  None Available. FINDINGS: Brain: Hypoattenuation and loss of gray white differentiation in the inferior right frontal lobe, suspicious for acute or subacute infarct. No mass occupying acute hemorrhage or significant mass effect. No midline shift. No hydrocephalus. Vascular: No hyperdense vessel. Skull: No acute fracture. Sinuses/Orbits: Paranasal sinus mucosal thickening, greatest in the right sphenoid sinus. No acute  orbital findings. Other: No mastoid effusions. IMPRESSION: Potentially acute or subacute infarct in the right frontal lobe. Recommend MRI to further evaluate. Electronically Signed   By: Feliberto Harts M.D.   On: 09/16/2022 17:22       Kathlen Mody M.D. [facility] 09/17/2022, 4:57 PM  Available via Epic secure chat 7am-7pm After 7 pm, please refer to night coverage provider listed on amion.

## 2022-09-17 NOTE — Evaluation (Signed)
Occupational Therapy Evaluation Patient Details Name: [name] [name] MRN: [id number] DOB: 06-Sep-1940 Today's Date: 09/17/2022   History of Present Illness Pt is an 82 y.o. male who presented 09/16/22 with AMS. CT showed potentially acute or subacute infarct in the right frontal lobe. PMH: mild cognitive impairment, ischemic cardiomyopathy and coronary artery disease s/p CABG x 3 (2015), pulmonary hypertension, CHF, left bundle branch block, prediabetes, HLD, HTN, mixed central and OSA intolerant of CPAP, COPD, hypothyroidism, lumbar disc disease s/p fusion, polycythemia vera (JAK2 positive on hydroxyurea)   Clinical Impression   PTA pt living independently with wife, including driving and medication management. Wife states he has mild cognitive deficits at baseline, however his current deficits are "much worse". Pt currently requires assistance with ADL and mobliity due to below listed deficits.  Session limited by pt going to CT. Will return. Recommend HHOT and direct 24/7 assistance with ADL and mobility. Discussed need to assist with medication management with wife, who verbalized understanding.      Recommendations for follow up therapy are one component of a multi-disciplinary discharge planning process, led by the attending physician.  Recommendations may be updated based on patient status, additional functional criteria and insurance authorization.   Assistance Recommended at Discharge Frequent or constant Supervision/Assistance  Patient can return home with the following Assist for transportation;Direct supervision/assist for financial management;Direct supervision/assist for medications management;Assistance with cooking/housework    Functional Status Assessment  Patient has had a recent decline in their functional status and demonstrates the ability to make significant improvements in function in a reasonable and predictable amount of time.  Equipment Recommendations  BSC/3in1     Recommendations for Other Services       Precautions / Restrictions Precautions Precautions: Fall Restrictions Weight Bearing Restrictions: No      Mobility Bed Mobility               General bed mobility comments: sitting EOB    Transfers Overall transfer level: Needs assistance Equipment used: 1 person hand held assist Transfers: Sit to/from Stand Sit to Stand: Min guard                  Balance   Sitting-balance support: No upper extremity supported, Feet supported Sitting balance-Leahy Scale: Good     Standing balance support: Bilateral upper extremity supported, Single extremity supported, During functional activity, No upper extremity supported Standing balance-Leahy Scale: Fair Standing balance comment: Able to stand statically at a min guard level, but needed minA to recover LOB when trying to stand dynamically with 1 UE support, benefits from RW                           ADL either performed or assessed with clinical judgement   ADL Overall ADL's : Needs assistance/impaired                                             Vision Baseline Vision/History: 1 Wears glasses (as needed since cataract surgery) Additional Comments: will assess     Perception     Praxis      Pertinent Vitals/Pain Pain Assessment Pain Assessment: Faces Faces Pain Scale: Hurts a little bit Pain Location: L knee chronic pain Pain Descriptors / Indicators: Discomfort Pain Intervention(s): Limited activity within patient's tolerance     Hand Dominance Right  Extremity/Trunk Assessment Upper Extremity Assessment Upper Extremity Assessment: Generalized weakness (using LUE functionally; wll further assess)   Lower Extremity Assessment Lower Extremity Assessment: Defer to PT evaluation   Cervical / Trunk Assessment Cervical / Trunk Assessment: Kyphotic   Communication     Cognition Arousal/Alertness: Awake/alert Behavior During  Therapy: WFL for tasks assessed/performed Overall Cognitive Status: Impaired/Different from baseline Area of Impairment: Attention, Following commands, Problem solving, Memory, Safety/judgement, Awareness                   Current Attention Level: Selective Memory: Decreased short-term memory Following Commands: Follows one step commands consistently, Follows one step commands with increased time     Problem Solving: Slow processing, Decreased initiation, Difficulty sequencing, Requires verbal cues, Requires tactile cues General Comments: will further assess; difficulty remembering wife's name initially     General Comments  wife educated on need to assist with all medication adn finanacial management adn to refrain from driving    Exercises     Shoulder Instructions      Home Living Family/patient expects to be discharged to:: Private residence Living Arrangements: Spouse/significant other Available Help at Discharge: Family Type of Home: House Home Access: Stairs to enter Secretary/administrator of Steps: 2 Entrance Stairs-Rails: None (uses doorfram) Home Layout: One level     Bathroom Shower/Tub: Producer, television/film/video: Handicapped height Bathroom Accessibility: Yes How Accessible: Accessible via walker Home Equipment: Rolling Walker (2 wheels);Grab bars - toilet          Prior Functioning/Environment Prior Level of Function : Independent/Modified Independent;Driving             Mobility Comments: No AD, no falls until this week. ADLs Comments: Wife reports early onset demenita for pt but he still manages meds and finances, but some difficulty with pills lately due to change in appearance of pills recently        OT Problem List: Decreased activity tolerance;Impaired balance (sitting and/or standing);Decreased cognition;Decreased safety awareness      OT Treatment/Interventions: Self-care/ADL training;Therapeutic exercise;Neuromuscular  education;DME and/or AE instruction;Therapeutic activities;Cognitive remediation/compensation;Visual/perceptual remediation/compensation;Patient/family education;Balance training    OT Goals(Current goals can be found in the care plan section) Acute Rehab OT Goals Patient Stated Goal: per wife for her husband to get better OT Goal Formulation: With patient/family Time For Goal Achievement: 10/01/22 Potential to Achieve Goals: Good  OT Frequency: Min 2X/week    Co-evaluation              AM-PAC OT "6 Clicks" Daily Activity     Outcome Measure Help from another person eating meals?: A Little Help from another person taking care of personal grooming?: A Little Help from another person toileting, which includes using toliet, bedpan, or urinal?: A Little Help from another person bathing (including washing, rinsing, drying)?: A Little Help from another person to put on and taking off regular upper body clothing?: A Little Help from another person to put on and taking off regular lower body clothing?: A Little 6 Click Score: 18   End of Session Nurse Communication: Other (comment) (DC needs)  Activity Tolerance: Other (comment) (limited eval due to pt going for CT) Patient left: Other (comment) (in WC with transport)  OT Visit Diagnosis: Unsteadiness on feet (R26.81);Muscle weakness (generalized) (M62.81);Other symptoms and signs involving cognitive function                Time: 1423-1435 OT Time Calculation (min): 12 min Charges:  OT General Charges $OT Visit:  1 Visit OT Evaluation $OT Eval Moderate Complexity: 1 Mod  Harjit Leider, OT/L   Acute OT Clinical Specialist Acute Rehabilitation Services Pager [phone] Office [phone]   [facility] 09/17/2022, 2:51 PM

## 2022-09-17 NOTE — TOC Transition Note (Signed)
Transition of Care [facility]) - CM/SW Discharge Note   Patient Details  Name: [name] [name] MRN: [id number] Date of Birth: [birth year]  [facility] CM/SW Contact:  Kermit Balo, RN Phone Number: 09/17/2022, 3:15 PM   Clinical Narrative:    Pt is from home with his spouse. They are together most of the time.  Pt was IADL prior to admission. He drove self as needed and managed his own medications. Wife is able to over see his medications and provide needed transportation.l Home health recommended and arranged with [facility]. Wife didn't have a preference on [facility] agency. Information on the AVS.  DME at home: walker Bedside commode ordered through [employer] and will be delivered to the room. Wife will transport home when medically ready.     Barriers to Discharge: Continued Medical Work up   Patient Goals and CMS Choice CMS Medicare.gov Compare Post Acute Care list provided to:: Patient Represenative (must comment) Choice offered to / list presented to : Spouse  Discharge Placement                         Discharge Plan and Services Additional resources added to the After Visit Summary for       Post Acute Care Choice: Home Health                    HH Arranged: PT, OT, Nurse's Aide [facility] Agency: [facility] Date [facility] Agency Contacted: 09/17/22   Representative spoke with at [facility] Agency: Kandee Keen  Social Determinants of Health (SDOH) Interventions SDOH Screenings   Housing: Low Risk  (03/29/2021)  Transportation Needs: No Transportation Needs (03/29/2021)  Depression (PHQ2-9): Low Risk  (06/11/2022)  Tobacco Use: Medium Risk (09/16/2022)     Readmission Risk Interventions     No data to display

## 2022-09-17 NOTE — Plan of Care (Signed)
  Problem: Education: Goal: Knowledge of disease or condition will improve Outcome: Progressing Goal: Knowledge of secondary prevention will improve (MUST DOCUMENT ALL) Outcome: Progressing Goal: Knowledge of patient specific risk factors will improve (Mark N/A or DELETE if not current risk factor) Outcome: Progressing   Problem: Ischemic Stroke/TIA Tissue Perfusion: Goal: Complications of ischemic stroke/TIA will be minimized Outcome: Progressing   

## 2022-09-17 NOTE — Progress Notes (Signed)
ANTICOAGULATION CONSULT NOTE - Initial Consult  Pharmacy Consult for Apixaban Indication: atrial fibrillation  Allergies  Allergen Reactions   Other Swelling    SODIUM SENSITIVE    Patient Measurements: Height: 5\' 7"  (170.2 cm) Weight: 100.2 kg (220 lb 14.4 oz) IBW/kg (Calculated) : 66.1  Vital Signs: Temp: 97.9 F (36.6 C) (05/01 1514) Temp Source: Oral (05/01 1514) BP: 129/71 (05/01 1514) Pulse Rate: 75 (05/01 1514)  Labs: Recent Labs    09/16/22 1223 09/16/22 1545  HGB 16.1  --   HCT 48.5  --   PLT 404*  --   LABPROT  --  21.7*  INR  --  1.9*  CREATININE 0.90  --     Estimated Creatinine Clearance: 72.6 mL/min (by C-G formula based on SCr of 0.9 mg/dL).   Medical History: Past Medical History:  Diagnosis Date   A-fib (HCC) 01/2014   AICD (automatic cardioverter/defibrillator) present    [employer]   Anal fissure 11/10/2019   Arthritis    ASHD (arteriosclerotic heart disease) 2015   s/p CABG   Cataract    s/p left   CHF (congestive heart failure) (HCC)    COPD (chronic obstructive pulmonary disease) (HCC)    by CXR, pt unsure of this   Diverticulosis    GERD (gastroesophageal reflux disease)    Gout 2014   Hyperlipidemia    Hypertension    Hypothyroidism    LBBB (left bundle branch block)    Polycythemia vera (HCC)    Prediabetes 01/2014   Presence of permanent cardiac pacemaker    Sleep apnea    no CPAP    Medications:  Scheduled:   allopurinol  300 mg Oral Daily   aspirin EC  81 mg Oral Daily   cyanocobalamin  1,000 mcg Oral Daily   docusate sodium  100 mg Oral BID   ezetimibe  10 mg Oral Daily   finasteride  5 mg Oral Daily   hydroxyurea  1,000 mg Oral Daily   levothyroxine  200 mcg Oral Q0600   magnesium oxide  400 mg Oral Daily   multivitamin with minerals  1 tablet Oral Daily   pantoprazole  40 mg Oral Daily   rosuvastatin  40 mg Oral Daily   tamsulosin  0.4 mg Oral Daily   Infusions:  PRN: acetaminophen,  melatonin  Assessment: 82 yo male on chronic warfarin for afib admitted on 4/30 with acute CVA. INR on admission = 1.9, CT today shows Continued evolution of the previously described infarct in the right frontal operculum. No new infarct or acute intracranial hemorrhage. Plan now is to transition to apixaban.  Goal of Therapy:  Therapeutic anticoagulation   Plan:  Apixaban 5mg  PO bid No dose adjustments needed, pharmacy will sign off dosing but will continue to monitor for signs/symptoms of bleeding. Please re-consult if needed.  Loralee Pacas, PharmD, BCPS Please see amion for complete clinical pharmacist phone list 09/17/2022,5:00 PM

## 2022-09-17 NOTE — Progress Notes (Signed)
  Echocardiogram 2D Echocardiogram has been performed.  [name] [name] 09/17/2022, 11:48 AM

## 2022-09-17 NOTE — Progress Notes (Addendum)
STROKE TEAM PROGRESS NOTE   SUBJECTIVE (INTERVAL HISTORY) [name] [name] wife is at the bedside.  Overall [name] [name] condition is stable.  He is still lethargic but opens eyes on voice and answer questions.  However still disorientated.   OBJECTIVE Temp:  [97.9 F (36.6 C)-99.1 F (37.3 C)] 97.9 F (36.6 C) (05/01 1514) Pulse Rate:  [71-80] 75 (05/01 1514) Cardiac Rhythm: Ventricular paced (05/01 0748) Resp:  [16-23] 19 (05/01 1514) BP: (116-168)/(61-86) 129/71 (05/01 1514) SpO2:  [93 %-100 %] 97 % (05/01 1143) Weight:  [100.2 kg] 100.2 kg (04/30 2240)  Recent Labs  Lab 09/16/22 1442  GLUCAP 126*   Recent Labs  Lab 09/16/22 1223 09/16/22 1412  NA 135  --   K 4.0  --   CL 99  --   CO2 31  --   GLUCOSE 121*  --   BUN 12  --   CREATININE 0.90  --   CALCIUM 9.8  --   MG  --  1.7   Recent Labs  Lab 09/16/22 1223  AST 37  ALT 53*  ALKPHOS 71  BILITOT 1.5*  PROT 6.7  ALBUMIN 4.3   Recent Labs  Lab 09/16/22 1223  WBC 13.1*  NEUTROABS 11.6*  HGB 16.1  HCT 48.5  MCV 111.8*  PLT 404*   No results for input(s): "CKTOTAL", "CKMB", "CKMBINDEX", "TROPONINI" in the last 168 hours. Recent Labs    09/16/22 1545  LABPROT 21.7*  INR 1.9*   Recent Labs    09/16/22 1634  COLORURINE YELLOW  LABSPEC 1.015  PHURINE 6.0  GLUCOSEU NEGATIVE  HGBUR NEGATIVE  BILIRUBINUR NEGATIVE  KETONESUR NEGATIVE  PROTEINUR 100*  NITRITE NEGATIVE  LEUKOCYTESUR NEGATIVE       Component Value Date/Time   CHOL 147 09/17/2022 0657   TRIG 122 09/17/2022 0657   HDL 23 (L) 09/17/2022 0657   CHOLHDL 6.4 09/17/2022 0657   VLDL 24 09/17/2022 0657   LDLCALC 100 (H) 09/17/2022 0657   LDLCALC 84 10/16/2021 1225   Lab Results  Component Value Date   HGBA1C 5.6 09/16/2022   No results found for: "LABOPIA", "COCAINSCRNUR", "LABBENZ", "AMPHETMU", "THCU", "LABBARB"  No results for input(s): "ETH" in the last 168 hours.  I have personally reviewed the radiological images below and agree with the  radiology interpretations.  CT HEAD WO CONTRAST ( )  Result Date: 09/17/2022 CLINICAL DATA:  Stroke, follow-up. EXAM: CT HEAD WITHOUT CONTRAST TECHNIQUE: Contiguous axial images were obtained from the base of the skull through the vertex without intravenous contrast. RADIATION DOSE REDUCTION: This exam was performed according to the departmental dose-optimization program which includes automated exposure control, adjustment of the mA and/or kV according to patient size and/or use of iterative reconstruction technique. COMPARISON:  Head CT and CTA head/neck 09/16/2022. FINDINGS: Brain: Continued evolution of the previously described infarct in the right frontal operculum. No new infarct is seen. No acute intracranial hemorrhage. No hydrocephalus or extra-axial collection. No mass effect or midline shift. Vascular: No new hyperdense vessel or unexpected calcification. Skull: No calvarial fracture or suspicious bone lesion. Skull base is unremarkable. Sinuses/Orbits: Unchanged chronic right sphenoid sinusitis. Orbits are unremarkable. Other: None. IMPRESSION: Continued evolution of the previously described infarct in the right frontal operculum. No new infarct or acute intracranial hemorrhage. Electronically Signed   By: Orvan Falconer M.D.   On: 09/17/2022 15:01   ECHOCARDIOGRAM COMPLETE  Result Date: 09/17/2022    ECHOCARDIOGRAM REPORT   Patient Name:   [name] [name] Date of Exam: 09/17/2022  Medical Rec #:  [id number]        Height:       67.0 in Accession #:    [id number]       Weight:       220.9 lb Date of Birth:  1940-09-01         BSA:          2.110 m Patient Age:    81 years         BP:           0/0 mmHg Patient Gender: M                HR:           88 bpm. Exam Location:  Inpatient Procedure: 2D Echo Indications:    Stroke  History:        Patient has no prior history of Echocardiogram examinations.  Sonographer:    Data processing manager Referring Phys: [id number] Marvel Plan IMPRESSIONS  1. Left  ventricular ejection fraction, by estimation, is 50 to 55%. The left ventricle has low normal function. The left ventricle has no regional wall motion abnormalities. There is moderate concentric left ventricular hypertrophy. Left ventricular diastolic parameters are consistent with Grade I diastolic dysfunction (impaired relaxation).  2. Right ventricular systolic function is normal. The right ventricular size is severely enlarged. There is normal pulmonary artery systolic pressure. The estimated right ventricular systolic pressure is 31.6 mmHg.  3. Left atrial size was severely dilated.  4. Right atrial size was severely dilated.  5. The mitral valve is degenerative. Trivial mitral valve regurgitation.  6. Decreased LV stroke volume index. DVI 0.33. Suspect moderate low flow low gradient aortic stenosis. The aortic valve is calcified. Aortic valve regurgitation is mild. Moderate aortic valve stenosis. Aortic valve mean gradient measures 25.0 mmHg. Aortic valve Vmax measures 3.11 m/s.  7. The inferior vena cava is dilated in size with <50% respiratory variability, suggesting right atrial pressure of 15 mmHg. Comparison(s): No prior Echocardiogram. FINDINGS  Left Ventricle: Left ventricular ejection fraction, by estimation, is 50 to 55%. The left ventricle has low normal function. The left ventricle has no regional wall motion abnormalities. The left ventricular internal cavity size was normal in size. There is moderate concentric left ventricular hypertrophy. Abnormal (paradoxical) septal motion, consistent with RV pacemaker. Left ventricular diastolic parameters are consistent with Grade I diastolic dysfunction (impaired relaxation). Right Ventricle: The right ventricular size is severely enlarged. No increase in right ventricular wall thickness. Right ventricular systolic function is normal. There is normal pulmonary artery systolic pressure. The tricuspid regurgitant velocity is 2.04 m/s, and with an assumed  right atrial pressure of 15 mmHg, the estimated right ventricular systolic pressure is 31.6 mmHg. Left Atrium: Left atrial size was severely dilated. Right Atrium: Right atrial size was severely dilated. Pericardium: There is no evidence of pericardial effusion. Mitral Valve: The mitral valve is degenerative in appearance. Trivial mitral valve regurgitation. Tricuspid Valve: The tricuspid valve is normal in structure. Tricuspid valve regurgitation is mild . No evidence of tricuspid stenosis. Aortic Valve: Decreased LV stroke volume index. DVI 0.33. Suspect moderate low flow low gradient aortic stenosis. The aortic valve is calcified. Aortic valve regurgitation is mild. Moderate aortic stenosis is present. Aortic valve mean gradient measures 25.0 mmHg. Aortic valve peak gradient measures 38.7 mmHg. Aortic valve area, by VTI measures 1.24 cm. Pulmonic Valve: The pulmonic valve was not well visualized. Pulmonic valve regurgitation is not visualized. No evidence of pulmonic stenosis. Aorta:  The aortic root is normal in size and structure. Venous: The inferior vena cava is dilated in size with less than 50% respiratory variability, suggesting right atrial pressure of 15 mmHg. IAS/Shunts: No atrial level shunt detected by color flow Doppler. Additional Comments: A device lead is visualized in the right ventricle and right atrium.  LEFT VENTRICLE PLAX 2D LVIDd:         4.60 cm      Diastology LVIDs:         3.50 cm      LV e' medial:    9.03 cm/s LV PW:         1.30 cm      LV E/e' medial:  13.5 LV IVS:        1.30 cm      LV e' lateral:   10.80 cm/s LVOT diam:     2.20 cm      LV E/e' lateral: 11.3 LV SV:         78 LV SV Index:   37 LVOT Area:     3.80 cm  LV Volumes (MOD) LV vol d, MOD A4C: 107.0 ml LV vol s, MOD A4C: 52.9 ml LV SV MOD A4C:     107.0 ml RIGHT VENTRICLE             IVC RV S prime:     16.40 cm/s  IVC diam: 3.20 cm TAPSE (M-mode): 1.3 cm LEFT ATRIUM              Index        RIGHT ATRIUM           Index  LA Vol (A2C):   107.0 ml 50.72 ml/m  RA Area:     36.80 cm LA Vol (A4C):   106.0 ml 50.25 ml/m  RA Volume:   149.00 ml 70.63 ml/m LA Biplane Vol: 115.0 ml 54.52 ml/m  AORTIC VALVE AV Area (Vmax):    1.20 cm AV Area (Vmean):   1.11 cm AV Area (VTI):     1.24 cm AV Vmax:           311.00 cm/s AV Vmean:          222.500 cm/s AV VTI:            0.626 m AV Peak Grad:      38.7 mmHg AV Mean Grad:      25.0 mmHg LVOT Vmax:         97.90 cm/s LVOT Vmean:        65.100 cm/s LVOT VTI:          0.204 m LVOT/AV VTI ratio: 0.33  AORTA Ao Root diam: 3.50 cm MITRAL VALVE                TRICUSPID VALVE MV Area (PHT): 4.68 cm     TR Peak grad:   16.6 mmHg MV E velocity: 121.50 cm/s  TR Vmax:        204.00 cm/s MV A velocity: 31.10 cm/s MV E/A ratio:  3.91         SHUNTS                             Systemic VTI:  0.20 m                             Systemic Diam: 2.20  cm Riley Lam MD Electronically signed by Riley Lam MD Signature Date/Time: 09/17/2022/1:18:14 PM    Final    CT ANGIO HEAD NECK W WO CM  Result Date: 09/16/2022 CLINICAL DATA:  Neuro deficit, acute, stroke suspected. Altered mental status. Right frontal infarct on CT today. EXAM: CT ANGIOGRAPHY HEAD AND NECK WITH AND WITHOUT CONTRAST TECHNIQUE: Multidetector CT imaging of the head and neck was performed using the standard protocol during bolus administration of intravenous contrast. Multiplanar CT image reconstructions and MIPs were obtained to evaluate the vascular anatomy. Carotid stenosis measurements (when applicable) are obtained utilizing NASCET criteria, using the distal internal carotid diameter as the denominator. RADIATION DOSE REDUCTION: This exam was performed according to the departmental dose-optimization program which includes automated exposure control, adjustment of the mA and/or kV according to patient size and/or use of iterative reconstruction technique. CONTRAST:  75mL OMNIPAQUE IOHEXOL 350 MG/ML SOLN COMPARISON:   None Available. FINDINGS: CTA NECK FINDINGS Aortic arch: Standard 3 vessel aortic arch with mild atherosclerotic plaque. No significant stenosis of the arch vessel origins. Right carotid system: Patent with extensive calcified and soft plaque at the carotid bifurcation and in the proximal ICA resulting in up to 55% stenosis of the proximal ICA. Tortuous mid cervical ICA. Left carotid system: Patent with scattered calcified and soft plaque in the mid common carotid artery without significant associated stenosis. More prominent calcified plaque at the carotid bifurcation and in the proximal ICA resulting in 60% stenosis of the proximal ICA. Tortuous distal cervical ICA. Vertebral arteries: Patent without evidence of significant stenosis or dissection. Skeleton: Advanced cervical disc and facet degeneration. Other neck: No evidence of cervical lymphadenopathy or mass. 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 Upper chest: No mass or consolidation in the included lung apices. Review of the MIP images confirms the above findings CTA HEAD FINDINGS Anterior circulation: The internal carotid arteries are patent from skull base to carotid termini with mild atherosclerotic plaque not resulting in significant stenosis. The right M1 segment is widely patent, however there is a severe stenosis or filling defect/subocclusive embolus involving a right M2 superior division branch vessel corresponding to the infarct on today's earlier CT (series 7, image 230 and series 11, image 22). There is attenuation of more distal right MCA branch vessels. The left MCA and both ACAs are patent without evidence of a significant proximal stenosis. No aneurysm is identified. Posterior circulation: The intracranial vertebral arteries are patent to the basilar with the right being moderately dominant. Calcified atherosclerosis results in at most mild right V4 stenosis. Patent AICA and SCA origins are seen bilaterally. The basilar artery is  widely patent. There are small posterior communicating arteries bilaterally. Both PCAs are patent without evidence of a significant proximal stenosis on the right. There is a severe left P1 stenosis. No aneurysm is identified. Venous sinuses: Patent. Anatomic variants: None. Review of the MIP images confirms the above findings IMPRESSION: 1. Suspected subocclusive embolus in a right M2 superior division branch vessel corresponding to the infarct on today's earlier head CT. 2. Severe left P1 stenosis. 3. 55% proximal right ICA stenosis. 4. 60% proximal left ICA stenosis. 5.  Aortic Atherosclerosis (ICD10-I70.0). Electronically Signed   By: Sebastian Ache M.D.   On: 09/16/2022 22:03   DG Chest Port 1 View  Result Date: 09/16/2022 CLINICAL DATA:  Altered mental status EXAM: PORTABLE CHEST 1 VIEW COMPARISON:  10/26/2019 FINDINGS: Post sternotomy changes. Left-sided pacing device as before. Cardiomegaly. No acute airspace disease, pleural effusion or pneumothorax. IMPRESSION: No active  disease. Cardiomegaly. Electronically Signed   By: Jasmine Pang M.D.   On: 09/16/2022 19:39   CT HEAD WO CONTRAST  Result Date: 09/16/2022 CLINICAL DATA:  Mental status change, unknown cause EXAM: CT HEAD WITHOUT CONTRAST TECHNIQUE: Contiguous axial images were obtained from the base of the skull through the vertex without intravenous contrast. RADIATION DOSE REDUCTION: This exam was performed according to the departmental dose-optimization program which includes automated exposure control, adjustment of the mA and/or kV according to patient size and/or use of iterative reconstruction technique. COMPARISON:  None Available. FINDINGS: Brain: Hypoattenuation and loss of gray white differentiation in the inferior right frontal lobe, suspicious for acute or subacute infarct. No mass occupying acute hemorrhage or significant mass effect. No midline shift. No hydrocephalus. Vascular: No hyperdense vessel. Skull: No acute fracture.  Sinuses/Orbits: Paranasal sinus mucosal thickening, greatest in the right sphenoid sinus. No acute orbital findings. Other: No mastoid effusions. IMPRESSION: Potentially acute or subacute infarct in the right frontal lobe. Recommend MRI to further evaluate. Electronically Signed   By: Feliberto Harts M.D.   On: 09/16/2022 17:22   DG Elbow Complete Right  Result Date: 08/27/2022 CLINICAL DATA:  RIGHT elbow pain for 1 day. EXAM: RIGHT ELBOW - COMPLETE 3+ VIEW COMPARISON:  None Available. FINDINGS: An elbow effusion is noted. There is no evidence of acute fracture, subluxation or dislocation. Moderate degenerative changes at the humeral ulnar joint noted. No focal bony lesions are present. IMPRESSION: 1. Elbow effusion without acute bony abnormality. 2. Moderate degenerative changes at the humeral ulnar joint. Electronically Signed   By: Harmon Pier M.D.   On: 08/27/2022 15:38     PHYSICAL EXAM  Temp:  [97.9 F (36.6 C)-99.1 F (37.3 C)] 97.9 F (36.6 C) (05/01 1514) Pulse Rate:  [71-80] 75 (05/01 1514) Resp:  [16-23] 19 (05/01 1514) BP: (116-168)/(61-86) 129/71 (05/01 1514) SpO2:  [93 %-100 %] 97 % (05/01 1143) Weight:  [100.2 kg] 100.2 kg (04/30 2240)  General - Well nourished, well developed, in no apparent distress.  Ophthalmologic - fundi not visualized due to noncooperation.  Cardiovascular - irregularly irregular heart rate and rhythm.  Neuro - sleepy but easily arousable, with eye opening, he is orientated to self and place but not to age, time and people. No aphasia, but paucity of speech, and following all simple commands. Able to name and repeat. No gaze palsy, tracking bilaterally, visual field full but with left simultanagnosia.  Slight left facial droop. Tongue midline. Bilateral UEs 5/5, no drift. Bilaterally LEs 4/5, no drift. Sensation symmetrical bilaterally, b/l FTN intact, gait not tested.    ASSESSMENT/PLAN Mr. LAVERT MATOUSEK is a 82 y.o. male with history of MCI,  CHF, CAD status post CABG 2015, pulmonary hypertension, A-fib on Coumadin, hypertension, hyperlipidemia, OSA, pacemaker, polycythemia with positive JAK2 on hydroxyurea, lower back pain status post fusion surgery admitted for confusion, ataxia, fatigue. No tPA given due to outside window.  Stroke:  right frontotemporal infarct embolic secondary to A-fib on Coumadin with subtherapeutic INR CT concerning right frontal infarct CT head and neck right M2 subocclusive embolus, left P1 severe stenosis, right ICA 50% stenosis, left ICA 60% stenosis MRI monitor performed due to incompatible ICD leads CT repeat continued evolution of the infarct in the right frontal operculum 2D Echo EF 50 to 55%, left atrium size severely dilated. LDL 100 HgbA1c 5.6 Eliquis for VTE prophylaxis warfarin daily prior to admission, now on Eliquis (apixaban) daily.  Continue Eliquis on discharge Patient counseled to be  compliant with [name] [name] antithrombotic medications Ongoing aggressive stroke risk factor management Therapy recommendations: Home health PT and OT Disposition: Pending  Chronic A-fib On Coumadin PTA Coumadin was on hold for 5 days to 3 weeks ago for EGD and colonoscopy, resumed after INR on admission 1.9 Discussed with wife, will switch to Eliquis  Hypertension Stable Long term BP goal normotensive  Hyperlipidemia Home meds: Crestor 40 and Zetia LDL 100, goal < 70 Now on Crestor 40 and Zetia Continue statin at discharge Recommend to follow-up with lipid clinic to consider PCSK9 inhibitors  Other Stroke Risk Factors Advanced age Obesity, Body mass index is 34.6 kg/m.  Hx stroke/TIA Coronary artery disease status post CABG in 2015, continue home aspirin 81 Obstructive sleep apnea, on CPAP at home Polycythemia with positive JAK2, continue hydroxyurea  Other Active Problems Pacemaker/AICD Pulmonary hypertension MCI Left BBB Lower back pain status post fusion surgery  Hospital day #  1    Marvel Plan, MD PhD Stroke Neurology 09/17/2022 6:59 PM   I discussed with Dr. Blake Divine. I spent extensive face-to-face time with the patient and [name] [name] wife, more than 50% of which was spent in counseling and coordination of care, reviewing test results, images and medication, and discussing the diagnosis, treatment plan and potential prognosis. This patient's care requiresreview of multiple databases, neurological assessment, discussion with family, other specialists and medical decision making of high complexity. I had long discussion with patient wife at bedside, updated pt current condition, treatment plan and potential prognosis, and answered all the questions.  She expressed understanding and appreciation.       To contact Stroke Continuity provider, please refer to [email or web address]. After hours, contact General Neurology

## 2022-09-17 NOTE — TOC Benefit Eligibility Note (Signed)
Patient Product/process development scientist completed.    The patient is currently admitted and upon discharge could be taking Eliquis 5 mg.  The current 30 day co-pay is $47.00.   The patient is insured through [employer] Part D   This test claim was processed through [facility]- copay amounts may vary at other pharmacies due to pharmacy/plan contracts, or as the patient moves through the different stages of their insurance plan.  Roland Earl, CPHT Pharmacy Patient Advocate Specialist [facility] Patient Advocate Team Direct Number: [phone]  Fax: [phone]

## 2022-09-17 NOTE — Progress Notes (Signed)
Pt requires a bedside commode as he has difficulty making it to the bathroom in a timely manner on the level of the home in which he will be staying.

## 2022-09-17 NOTE — Telephone Encounter (Signed)
Pharmacy Patient Advocate Encounter  Insurance verification completed.    The patient is insured through [employer] Part D   The patient is currently admitted and ran test claims for the following: Eliquis .  Copays and coinsurance results were relayed to Inpatient clinical team.      

## 2022-09-17 NOTE — Plan of Care (Signed)
Per MRI technician: "generator is conditional however, he has a medtronic lead that is unsafe with that generator per [employer] and therefore he is unable to undergo MRI. MRI brain order canceled and head CT ordered for noon

## 2022-09-17 NOTE — Evaluation (Signed)
Physical Therapy Evaluation Patient Details Name: [name] [name] MRN: [id number] DOB: [birth year] Today's Date: 09/17/2022  History of Present Illness  Pt is an 82 y.o. male who presented 09/16/22 with AMS. CT showed potentially acute or subacute infarct in the right frontal lobe. PMH: mild cognitive impairment, ischemic cardiomyopathy and coronary artery disease s/p CABG x 3 (2015), pulmonary hypertension, CHF, left bundle branch block, prediabetes, HLD, HTN, mixed central and OSA intolerant of CPAP, COPD, hypothyroidism, lumbar disc disease s/p fusion, polycythemia vera (JAK2 positive on hydroxyurea)   Clinical Impression  Pt presents with condition above and deficits mentioned below, see PT Problem List. PTA, he was independent without DME, driving, and living with his wife in a 1-level house with 2 STE. Currently, pt demonstrates deficits potentially in communication along with deficits in cognition, balance, and activity tolerance. Pt also displays some very mild L leg weakness compared to his R and L leg incoordination with rapid alternating movements. Pt is currently benefiting from a RW to ambulate, needing min guard assist majority of the time but minA to recover during his x1 LOB. Pt is at risk for falls. Educated pt's wife on recs to guard pt with standing mobility and administered a gait belt. Pt could benefit from follow-up with HHPT and further acute PT services.      Recommendations for follow up therapy are one component of a multi-disciplinary discharge planning process, led by the attending physician.  Recommendations may be updated based on patient status, additional functional criteria and insurance authorization.  Follow Up Recommendations       Assistance Recommended at Discharge Frequent or constant Supervision/Assistance  Patient can return home with the following  A little help with walking and/or transfers;A little help with bathing/dressing/bathroom;Assistance with  cooking/housework;Direct supervision/assist for medications management;Direct supervision/assist for financial management;Assist for transportation;Help with stairs or ramp for entrance    Equipment Recommendations BSC/3in1  Recommendations for Other Services       Functional Status Assessment Patient has had a recent decline in their functional status and demonstrates the ability to make significant improvements in function in a reasonable and predictable amount of time.     Precautions / Restrictions Precautions Precautions: Fall Restrictions Weight Bearing Restrictions: No      Mobility  Bed Mobility Overal bed mobility: Needs Assistance Bed Mobility: Supine to Sit, Sit to Supine     Supine to sit: HOB elevated, Min assist Sit to supine: Min guard, HOB elevated   General bed mobility comments: MinA for tactile and verbal cues to guide legs off L EOB and cue pt to sit up. Min guard with return to supine    Transfers Overall transfer level: Needs assistance Equipment used: Rolling walker (2 wheels), None Transfers: Sit to/from Stand Sit to Stand: Min guard           General transfer comment: Extra time but pt able to transfer to stand 2x from EOB, min guard for safety    Ambulation/Gait Ambulation/Gait assistance: Min guard, Min assist Gait Distance (Feet): 180 Feet Assistive device: Rolling walker (2 wheels) Gait Pattern/deviations: Step-through pattern, Decreased stride length, Trunk flexed Gait velocity: reduced Gait velocity interpretation: <1.8 ft/sec, indicate of risk for recurrent falls   General Gait Details: Pt needs cues to remain proximal within the RW, stand upright, and look superiorly. x1 LOB when lifting R hand off RW while being cued to turn, minA to regain balance. Min guard assist majority of time for safety  Stairs  Wheelchair Mobility    Modified Rankin (Stroke Patients Only) Modified Rankin (Stroke Patients  Only) Pre-Morbid Rankin Score: No symptoms Modified Rankin: Moderately severe disability     Balance Overall balance assessment: Needs assistance Sitting-balance support: No upper extremity supported, Feet supported Sitting balance-Leahy Scale: Fair     Standing balance support: Bilateral upper extremity supported, Single extremity supported, During functional activity, No upper extremity supported Standing balance-Leahy Scale: Fair Standing balance comment: Able to stand statically at a min guard level, but needed minA to recover LOB when trying to stand dynamically with 1 UE support, benefits from RW                             Pertinent Vitals/Pain Pain Assessment Pain Assessment: Faces Faces Pain Scale: Hurts a little bit Pain Location: L knee chronic pain Pain Descriptors / Indicators: Discomfort Pain Intervention(s): Limited activity within patient's tolerance, Monitored during session, Repositioned    Home Living Family/patient expects to be discharged to:: Private residence Living Arrangements: Spouse/significant other Available Help at Discharge: Family Type of Home: House Home Access: Stairs to enter Entrance Stairs-Rails: None (uses doorfram) Secretary/administrator of Steps: 2   Home Layout: One level Home Equipment: Agricultural consultant (2 wheels);Grab bars - toilet Additional Comments: Wife reports early onset demenita for pt    Prior Function Prior Level of Function : Independent/Modified Independent;Driving             Mobility Comments: No AD, no falls until this week. ADLs Comments: Wife reports early onset demenita for pt but he still manages meds and finances, but some difficulty with pills lately due to change in appearance of pills recently     Hand Dominance        Extremity/Trunk Assessment   Upper Extremity Assessment Upper Extremity Assessment: Defer to OT evaluation    Lower Extremity Assessment Lower Extremity Assessment: LLE  deficits/detail LLE Deficits / Details: very minor weakness compared to R side with MMT if any, scoring 4+ to 5 grossly bil; denied numbness/tingling bil; dysdiadochokinesia noted on L LLE Sensation: WNL LLE Coordination: decreased gross motor;decreased fine motor    Cervical / Trunk Assessment Cervical / Trunk Assessment: Kyphotic  Communication   Communication: Receptive difficulties;Expressive difficulties (unsure if communication deficits or cog deficits)  Cognition Arousal/Alertness: Awake/alert Behavior During Therapy: WFL for tasks assessed/performed Overall Cognitive Status: Impaired/Different from baseline Area of Impairment: Attention, Following commands, Problem solving                   Current Attention Level: Selective   Following Commands: Follows one step commands consistently, Follows one step commands with increased time     Problem Solving: Slow processing, Decreased initiation, Difficulty sequencing, Requires verbal cues, Requires tactile cues General Comments: Pt having difficulty motor planning how to sit up EOB, needing multi-modal cues and extra time to process and sequence mobility. Slow to respond and needing repetition of cues at times due to pt not seeming to understand or hear therapist        General Comments General comments (skin integrity, edema, etc.): educated wife on pt's need for physical guarding upon d/c    Exercises     Assessment/Plan    PT Assessment Patient needs continued PT services  PT Problem List Decreased strength;Decreased activity tolerance;Decreased balance;Decreased mobility;Decreased coordination;Decreased cognition       PT Treatment Interventions DME instruction;Gait training;Stair training;Functional mobility training;Therapeutic activities;Balance training;Therapeutic exercise;Neuromuscular re-education;Cognitive remediation;Patient/family education  PT Goals (Current goals can be found in the Care Plan  section)  Acute Rehab PT Goals Patient Stated Goal: to improve PT Goal Formulation: With patient/family Time For Goal Achievement: 10/01/22 Potential to Achieve Goals: Good    Frequency Min 4X/week     Co-evaluation               AM-PAC PT "6 Clicks" Mobility  Outcome Measure Help needed turning from your back to your side while in a flat bed without using bedrails?: A Little Help needed moving from lying on your back to sitting on the side of a flat bed without using bedrails?: A Little Help needed moving to and from a bed to a chair (including a wheelchair)?: A Little Help needed standing up from a chair using your arms (e.g., wheelchair or bedside chair)?: A Little Help needed to walk in hospital room?: A Little Help needed climbing 3-5 steps with a railing? : A Lot 6 Click Score: 17    End of Session Equipment Utilized During Treatment: Gait belt Activity Tolerance: Patient tolerated treatment well Patient left: in bed;with call bell/phone within reach;with bed alarm set   PT Visit Diagnosis: Unsteadiness on feet (R26.81);Other abnormalities of gait and mobility (R26.89);Muscle weakness (generalized) (M62.81);Difficulty in walking, not elsewhere classified (R26.2)    Time: 1014-1040 PT Time Calculation (min) (ACUTE ONLY): 26 min   Charges:   PT Evaluation $PT Eval Moderate Complexity: 1 Mod PT Treatments $Therapeutic Activity: 8-22 mins        Raymond Gurney, PT, DPT Acute Rehabilitation Services  Office: [phone]   Jewel Baize 09/17/2022, 11:34 AM

## 2022-09-18 ENCOUNTER — Encounter: Payer: Self-pay | Admitting: Hematology

## 2022-09-18 ENCOUNTER — Other Ambulatory Visit (HOSPITAL_COMMUNITY): Payer: Self-pay

## 2022-09-18 DIAGNOSIS — I639 Cerebral infarction, unspecified: Secondary | ICD-10-CM | POA: Diagnosis not present

## 2022-09-18 DIAGNOSIS — I480 Paroxysmal atrial fibrillation: Secondary | ICD-10-CM | POA: Diagnosis not present

## 2022-09-18 DIAGNOSIS — I10 Essential (primary) hypertension: Secondary | ICD-10-CM | POA: Diagnosis not present

## 2022-09-18 DIAGNOSIS — E039 Hypothyroidism, unspecified: Secondary | ICD-10-CM | POA: Diagnosis not present

## 2022-09-18 LAB — CBC WITH DIFFERENTIAL/PLATELET
Abs Immature Granulocytes: 0.07 10*3/uL (ref 0.00–0.07)
Basophils Absolute: 0.1 10*3/uL (ref 0.0–0.1)
Basophils Relative: 1 %
Eosinophils Absolute: 0.1 10*3/uL (ref 0.0–0.5)
Eosinophils Relative: 1 %
HCT: 45.2 % (ref 39.0–52.0)
Hemoglobin: 14.9 g/dL (ref 13.0–17.0)
Immature Granulocytes: 1 %
Lymphocytes Relative: 11 %
Lymphs Abs: 1.2 10*3/uL (ref 0.7–4.0)
MCH: 36.7 pg — ABNORMAL HIGH (ref 26.0–34.0)
MCHC: 33 g/dL (ref 30.0–36.0)
MCV: 111.3 fL — ABNORMAL HIGH (ref 80.0–100.0)
Monocytes Absolute: 0.6 10*3/uL (ref 0.1–1.0)
Monocytes Relative: 5 %
Neutro Abs: 8.8 10*3/uL — ABNORMAL HIGH (ref 1.7–7.7)
Neutrophils Relative %: 81 %
Platelets: 363 10*3/uL (ref 150–400)
RBC: 4.06 MIL/uL — ABNORMAL LOW (ref 4.22–5.81)
RDW: 14.4 % (ref 11.5–15.5)
WBC: 10.8 10*3/uL — ABNORMAL HIGH (ref 4.0–10.5)
nRBC: 0 % (ref 0.0–0.2)

## 2022-09-18 LAB — BASIC METABOLIC PANEL
Anion gap: 9 (ref 5–15)
BUN: 14 mg/dL (ref 8–23)
CO2: 26 mmol/L (ref 22–32)
Calcium: 9.1 mg/dL (ref 8.9–10.3)
Chloride: 99 mmol/L (ref 98–111)
Creatinine, Ser: 1.03 mg/dL (ref 0.61–1.24)
GFR, Estimated: >60 mL/min (ref >60)
Glucose, Bld: 120 mg/dL — ABNORMAL HIGH (ref 70–99)
Potassium: 3.9 mmol/L (ref 3.5–5.1)
Sodium: 134 mmol/L — ABNORMAL LOW (ref 135–145)

## 2022-09-18 MED ORDER — APIXABAN 5 MG PO TABS: 5.0000 mg | ORAL_TABLET | Freq: Two times a day (BID) | ORAL | 1 refills | Status: DC

## 2022-09-18 MED ORDER — APIXABAN 5 MG PO TABS
5.0000 mg | ORAL_TABLET | Freq: Two times a day (BID) | ORAL | 2 refills | Status: DC
  Filled 2022-09-18: qty 60, 30d supply, fill #0

## 2022-09-18 NOTE — Progress Notes (Signed)
Physical Therapy Treatment Patient Details Name: [name] [name] MRN: [id number] DOB: [birth year] Today's Date: 09/18/2022   History of Present Illness Pt is an 82 y.o. male who presented 09/16/22 with AMS. CT showed potentially acute or subacute infarct in the right frontal lobe. PMH: mild cognitive impairment, ischemic cardiomyopathy and coronary artery disease s/p CABG x 3 (2015), pulmonary hypertension, CHF, left bundle branch block, prediabetes, HLD, HTN, mixed central and OSA intolerant of CPAP, COPD, hypothyroidism, lumbar disc disease s/p fusion, polycythemia vera (JAK2 positive on hydroxyurea)    PT Comments    Pt progressing well with mobility, ambulated 200' with RW and min guard A. Worked on basic cognitive tasks while ambulating which challenged pt greatly. Cues to stop ambulating before letting go of RW to point things out or scratch face. Wife present and aware of safety deficits and ways to cue him. PT will continue to follow.    Recommendations for follow up therapy are one component of a multi-disciplinary discharge planning process, led by the attending physician.  Recommendations may be updated based on patient status, additional functional criteria and insurance authorization.  Follow Up Recommendations       Assistance Recommended at Discharge Frequent or constant Supervision/Assistance  Patient can return home with the following A little help with walking and/or transfers;A little help with bathing/dressing/bathroom;Assistance with cooking/housework;Direct supervision/assist for medications management;Direct supervision/assist for financial management;Assist for transportation;Help with stairs or ramp for entrance   Equipment Recommendations  BSC/3in1    Recommendations for Other Services       Precautions / Restrictions Precautions Precautions: Fall Restrictions Weight Bearing Restrictions: No     Mobility  Bed Mobility Overal bed mobility: Needs  Assistance Bed Mobility: Supine to Sit     Supine to sit: Supervision     General bed mobility comments: bed flat, no rail, pt was able to come to sitting with supervision and increased time, no physical assist    Transfers Overall transfer level: Needs assistance Equipment used: Rolling walker (2 wheels) Transfers: Sit to/from Stand Sit to Stand: Min guard           General transfer comment: vc's for hand placement    Ambulation/Gait Ambulation/Gait assistance: Min guard Gait Distance (Feet): 200 Feet Assistive device: Rolling walker (2 wheels) Gait Pattern/deviations: Step-through pattern, Decreased stride length, Trunk flexed, Narrow base of support Gait velocity: reduced Gait velocity interpretation: <1.8 ft/sec, indicate of risk for recurrent falls   General Gait Details: continues to need cues to remain within RW. Began needing min A but quickly progressed to min guard. Shoes donned prior to ambulation and pt did well clearing feet but B ankles rubbing at times with swing through. 3 minor instabilities early in gait but no full LOB. Needs vc's for erect posture and safety as he continues to let go of RW with one hand while walking if he wants to scratch face or point to something. Cues to stop walking before letting go.   Stairs             Wheelchair Mobility    Modified Rankin (Stroke Patients Only) Modified Rankin (Stroke Patients Only) Pre-Morbid Rankin Score: No symptoms Modified Rankin: Moderately severe disability     Balance Overall balance assessment: Needs assistance Sitting-balance support: No upper extremity supported, Feet supported Sitting balance-Leahy Scale: Good     Standing balance support: Bilateral upper extremity supported, Single extremity supported, During functional activity, No upper extremity supported Standing balance-Leahy Scale: Fair Standing balance comment: much more  steady with UE support                             Cognition Arousal/Alertness: Awake/alert Behavior During Therapy: WFL for tasks assessed/performed Overall Cognitive Status: Impaired/Different from baseline Area of Impairment: Attention, Following commands, Problem solving, Memory, Safety/judgement, Awareness                   Current Attention Level: Selective Memory: Decreased short-term memory Following Commands: Follows one step commands with increased time     Problem Solving: Slow processing General Comments: pt is aware that he has memory deficits and can verbalize this. Unable to recall current date or his own birthday.        Exercises      General Comments General comments (skin integrity, edema, etc.): worked on pathfinding and basic cognitive tasks while ambulating      Pertinent Vitals/Pain Pain Assessment Pain Assessment: No/denies pain    Home Living                          Prior Function            PT Goals (current goals can now be found in the care plan section) Acute Rehab PT Goals Patient Stated Goal: to improve PT Goal Formulation: With patient/family Time For Goal Achievement: 10/01/22 Potential to Achieve Goals: Good Progress towards PT goals: Progressing toward goals    Frequency    Min 4X/week      PT Plan Current plan remains appropriate    Co-evaluation              AM-PAC PT "6 Clicks" Mobility   Outcome Measure  Help needed turning from your back to your side while in a flat bed without using bedrails?: A Little Help needed moving from lying on your back to sitting on the side of a flat bed without using bedrails?: A Little Help needed moving to and from a bed to a chair (including a wheelchair)?: A Little Help needed standing up from a chair using your arms (e.g., wheelchair or bedside chair)?: A Little Help needed to walk in hospital room?: A Little Help needed climbing 3-5 steps with a railing? : A Lot 6 Click Score: 17    End of  Session Equipment Utilized During Treatment: Gait belt Activity Tolerance: Patient tolerated treatment well Patient left: in bed;with call bell/phone within reach;with bed alarm set;with family/visitor present (sitting EOB) Nurse Communication: Mobility status PT Visit Diagnosis: Unsteadiness on feet (R26.81);Other abnormalities of gait and mobility (R26.89);Muscle weakness (generalized) (M62.81);Difficulty in walking, not elsewhere classified (R26.2)     Time: 1610-9604 PT Time Calculation (min) (ACUTE ONLY): 26 min  Charges:  $Gait Training: 23-37 mins                     Lyanne Co, PT  Acute Rehab Services Secure chat preferred Office [phone]    Lawana Chambers Traveion Ruddock 09/18/2022, 11:55 AM

## 2022-09-18 NOTE — Progress Notes (Signed)
1558 Pt has been discharged home. IV removed. Catheter intact. Wife and pt have received discharge instructions and medications from TOC. All belongings with pt. Pt accompanied to car with nursing staff.

## 2022-09-18 NOTE — Evaluation (Signed)
Speech Language Pathology Evaluation Patient Details Name: [name] [name] MRN: [id number] DOB: 06/03/1940 Today's Date: 09/18/2022 Time: 4098-1191 SLP Time Calculation (min) (ACUTE ONLY): 24 min  Problem List:  Patient Active Problem List   Diagnosis Date Noted   Acute CVA (cerebrovascular accident) (HCC) 09/16/2022   Chronic diastolic CHF (congestive heart failure) (HCC) 09/16/2022   Encounter for screening colonoscopy 08/26/2022   Chronic ulcer of left foot with fat layer exposed (HCC) 07/10/2022   Zenker's diverticulum 10/16/2021   Loss of appetite 10/10/2021   Loss of weight 10/10/2021   Generalized abdominal pain 10/10/2021   Thrombosed external hemorrhoid 10/10/2021   Dysphagia 08/01/2021   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   Intolerance of continuous positive airway pressure (CPAP) ventilation 05/02/2021   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   JAK2 V617F mutation 08/27/2020   Rectal pain 11/10/2019   Constipation 11/10/2019   Abnormal glucose 10/12/2019   Memory changes 01/10/2019   Polycythemia vera (HCC) 10/21/2018   Thrombocytosis 12/29/2017   Senile purpura (HCC) 09/01/2017   Emphysema of lung (HCC) 06/01/2017   Tortuous aorta (HCC)- CXR 2018 06/01/2017   Regular astigmatism, right eye 10/16/2016   Combined forms of age-related cataract of right eye 10/16/2016   Trigger ring finger of left hand 08/06/2016   Bilateral carpal tunnel syndrome 08/06/2016   Ischemic cardiomyopathy 06/22/2015   ASHD/CABG x 3V (11/2013) 02/06/2015   Acquired thrombophilia (HCC) 02/06/2015   Cardiac pacemaker/AICD (01/2014) 02/06/2015   Morbid obesity (HCC) 01/03/2015   HTN (hypertension) 01/02/2015   Hyperlipidemia, mixed 01/02/2015   Vitamin D deficiency 01/02/2015   GERD  01/02/2015   BPH (benign prostatic hyperplasia) 01/02/2015   Medication management 01/02/2015   Atrial fibrillation (HCC) 01/02/2015   Hypothyroidism 01/02/2015   OSA and COPD overlap  syndrome (HCC) 01/02/2015   Chronic systolic (congestive) heart failure (HCC) 02/17/2014   S/P CABG x 3 02/13/2014   Atherosclerotic heart disease of native coronary artery without angina pectoris 02/08/2014   Gout 02/08/2014   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) 02/06/2014   Past Medical History:  Past Medical History:  Diagnosis Date   A-fib (HCC) 01/2014   AICD (automatic cardioverter/defibrillator) present    [employer]   Anal fissure 11/10/2019   Arthritis    ASHD (arteriosclerotic heart disease) 2015   s/p CABG   Cataract    s/p left   CHF (congestive heart failure) (HCC)    COPD (chronic obstructive pulmonary disease) (HCC)    by CXR, pt unsure of this   Diverticulosis    GERD (gastroesophageal reflux disease)    Gout 2014   Hyperlipidemia    Hypertension    Hypothyroidism    LBBB (left bundle branch block)    Polycythemia vera (HCC)    Prediabetes 01/2014   Presence of permanent cardiac pacemaker    Sleep apnea    no CPAP   Past Surgical History:  Past Surgical History:  Procedure Laterality Date   CARDIAC DEFIBRILLATOR PLACEMENT  07/2014   CATARACT EXTRACTION W/ INTRAOCULAR LENS IMPLANT Left    COLONOSCOPY  08/26/2022   normal   CORONARY ARTERY BYPASS GRAFT  11/2013   ESOPHAGOGASTRODUODENOSCOPY  08/26/2022   esophageal dilation   lumb laminectomy  4782,9562,1308   x 3   LUMBAR FUSION  2013   NASAL SEPTOPLASTY W/ TURBINOPLASTY Bilateral 01/30/2016   Procedure: NASAL SEPTOPLASTY WITH BILATERAL TURBINATE REDUCTION;  Surgeon: Drema Halon, MD;  Location: [facility] OR;  Service: ENT;  Laterality: Bilateral;  TONSILLECTOMY     UVULECTOMY N/A 01/30/2016   Procedure: UVULECTOMY;  Surgeon: Drema Halon, MD;  Location: [facility] OR;  Service: ENT;  Laterality: N/A;   HPI:  Pt is an 82 y.o. male who presented 09/16/22 with AMS. CT showed potentially acute or subacute infarct in the right frontal lobe. PMH: mild cognitive impairment,  ischemic cardiomyopathy and coronary artery disease s/p CABG x 3 (2015), pulmonary hypertension, CHF, left bundle branch block, prediabetes, HLD, HTN, mixed central and OSA intolerant of CPAP, COPD, hypothyroidism, lumbar disc disease s/p fusion, polycythemia vera (JAK2 positive on hydroxyurea)   Assessment / Plan / Recommendation Clinical Impression  Pt's wife reports mild impairments in cognition prior to stroke. She describes difficulty with non declartive memory task such as working remote control, medication management. Pt is responsible for household finances and his medication. Oromotor, language and speech are within normal limits. He scored an 11/30 on SLUMS falling in the significant cognitive impairment range. Deficits noted in immediate and procedural memory (could not recall 5 words independently or with choices), divergent naming and prospective memory with paragraph recall (0/8). Continued therapy is recommended while in hospital and at next venue of care to facilitate cognitive abilities. Educated wife to provide full supervision to pt while taking/preparing his medications and with finances.    SLP Assessment  SLP Recommendation/Assessment: Patient needs continued Speech Lanaguage Pathology Services SLP Visit Diagnosis: Cognitive communication deficit (R41.841)    Recommendations for follow up therapy are one component of a multi-disciplinary discharge planning process, led by the attending physician.  Recommendations may be updated based on patient status, additional functional criteria and insurance authorization.    Follow Up Recommendations  Home health SLP    Assistance Recommended at Discharge     Functional Status Assessment Patient has had a recent decline in their functional status and demonstrates the ability to make significant improvements in function in a reasonable and predictable amount of time.  Frequency and Duration min 2x/week  2 weeks      SLP  Evaluation Cognition  Overall Cognitive Status: Impaired/Different from baseline Arousal/Alertness: Awake/alert Orientation Level: Oriented to person;Oriented to place (not oriented to year) Year:  (2015) Day of Week: Correct Memory:  (0/5) Awareness: Impaired Awareness Impairment: Intellectual impairment;Emergent impairment;Anticipatory impairment Problem Solving: Impaired Safety/Judgment: Impaired       Comprehension  Auditory Comprehension Overall Auditory Comprehension: Appears within functional limits for tasks assessed Visual Recognition/Discrimination Discrimination: Not tested Reading Comprehension Reading Status: Not tested    Expression Expression Primary Mode of Expression: Verbal Verbal Expression Overall Verbal Expression: Appears within functional limits for tasks assessed Pragmatics: No impairment Written Expression Dominant Hand: Right Written Expression: Not tested   Oral / Motor  Motor Speech Overall Motor Speech: Appears within functional limits for tasks assessed Articulation: Within functional limitis Intelligibility: Intelligible Motor Planning: Witnin functional limits            Royce Macadamia 09/18/2022, 12:16 PM

## 2022-09-18 NOTE — Plan of Care (Signed)

## 2022-09-18 NOTE — TOC Transition Note (Signed)
Transition of Care [facility]) - CM/SW Discharge Note   Patient Details  Name: [name] [name] MRN: [id number] Date of Birth: 1940/06/10  Transition of Care [facility]) CM/SW Contact:  Kermit Balo, RN Phone Number: 09/18/2022, 1:28 PM   Clinical Narrative:    Patient is discharging home with home health through [place]. Information on the AVS.  Pt starting on Eliquis. TOC pharmacy will deliver the medication to the room and apply 30 day free coupon.  Pts spouse will provide transport home.   Final next level of care: Home w Home Health Services Barriers to Discharge: No Barriers Identified   Patient Goals and CMS Choice CMS Medicare.gov Compare Post Acute Care list provided to:: Patient Represenative (must comment) Choice offered to / list presented to : Spouse  Discharge Placement                         Discharge Plan and Services Additional resources added to the After Visit Summary for       Post Acute Care Choice: Home Health                    HH Arranged: PT, OT, Nurse's Aide [facility] Agency: [facility] Date [facility] Agency Contacted: 09/17/22   Representative spoke with at [facility] Agency: Kandee Keen  Social Determinants of Health (SDOH) Interventions SDOH Screenings   Housing: Low Risk  (03/29/2021)  Transportation Needs: No Transportation Needs (03/29/2021)  Depression (PHQ2-9): Low Risk  (06/11/2022)  Tobacco Use: Medium Risk (09/16/2022)     Readmission Risk Interventions     No data to display

## 2022-09-18 NOTE — Progress Notes (Addendum)
STROKE TEAM PROGRESS NOTE   SUBJECTIVE (INTERVAL HISTORY) His wife is at the bedside.  He is awake alert and much more interactive today, orientated to place, people and month but not year or age. Moving all extremities, no hemianopia.    OBJECTIVE Temp:  [97.8 F (36.6 C)-98.8 F (37.1 C)] 97.8 F (36.6 C) (05/02 1207) Pulse Rate:  [71-80] 71 (05/02 1207) Cardiac Rhythm: Bundle branch block (05/02 0700) Resp:  [16-20] 16 (05/02 1207) BP: (125-134)/(66-74) 134/74 (05/02 1207) SpO2:  [94 %-95 %] 95 % (05/02 1207)  Recent Labs  Lab 09/16/22 1442  GLUCAP 126*   Recent Labs  Lab 09/16/22 1223 09/16/22 1412 09/18/22 0656  NA 135  --  134*  K 4.0  --  3.9  CL 99  --  99  CO2 31  --  26  GLUCOSE 121*  --  120*  BUN 12  --  14  CREATININE 0.90  --  1.03  CALCIUM 9.8  --  9.1  MG  --  1.7  --    Recent Labs  Lab 09/16/22 1223  AST 37  ALT 53*  ALKPHOS 71  BILITOT 1.5*  PROT 6.7  ALBUMIN 4.3   Recent Labs  Lab 09/16/22 1223 09/18/22 0656  WBC 13.1* 10.8*  NEUTROABS 11.6* 8.8*  HGB 16.1 14.9  HCT 48.5 45.2  MCV 111.8* 111.3*  PLT 404* 363   No results for input(s): "CKTOTAL", "CKMB", "CKMBINDEX", "TROPONINI" in the last 168 hours. Recent Labs    09/16/22 1545  LABPROT 21.7*  INR 1.9*   Recent Labs    09/16/22 1634  COLORURINE YELLOW  LABSPEC 1.015  PHURINE 6.0  GLUCOSEU NEGATIVE  HGBUR NEGATIVE  BILIRUBINUR NEGATIVE  KETONESUR NEGATIVE  PROTEINUR 100*  NITRITE NEGATIVE  LEUKOCYTESUR NEGATIVE       Component Value Date/Time   CHOL 147 09/17/2022 0657   TRIG 122 09/17/2022 0657   HDL 23 (L) 09/17/2022 0657   CHOLHDL 6.4 09/17/2022 0657   VLDL 24 09/17/2022 0657   LDLCALC 100 (H) 09/17/2022 0657   LDLCALC 84 10/16/2021 1225   Lab Results  Component Value Date   HGBA1C 5.6 09/16/2022   No results found for: "LABOPIA", "COCAINSCRNUR", "LABBENZ", "AMPHETMU", "THCU", "LABBARB"  No results for input(s): "ETH" in the last 168 hours.  I have  personally reviewed the radiological images below and agree with the radiology interpretations.  CT HEAD WO CONTRAST ( )  Result Date: 09/17/2022 CLINICAL DATA:  Stroke, follow-up. EXAM: CT HEAD WITHOUT CONTRAST TECHNIQUE: Contiguous axial images were obtained from the base of the skull through the vertex without intravenous contrast. RADIATION DOSE REDUCTION: This exam was performed according to the departmental dose-optimization program which includes automated exposure control, adjustment of the mA and/or kV according to patient size and/or use of iterative reconstruction technique. COMPARISON:  Head CT and CTA head/neck 09/16/2022. FINDINGS: Brain: Continued evolution of the previously described infarct in the right frontal operculum. No new infarct is seen. No acute intracranial hemorrhage. No hydrocephalus or extra-axial collection. No mass effect or midline shift. Vascular: No new hyperdense vessel or unexpected calcification. Skull: No calvarial fracture or suspicious bone lesion. Skull base is unremarkable. Sinuses/Orbits: Unchanged chronic right sphenoid sinusitis. Orbits are unremarkable. Other: None. IMPRESSION: Continued evolution of the previously described infarct in the right frontal operculum. No new infarct or acute intracranial hemorrhage. Electronically Signed   By: Orvan Falconer M.D.   On: 09/17/2022 15:01   ECHOCARDIOGRAM COMPLETE  Result Date: 09/17/2022  ECHOCARDIOGRAM REPORT   Patient Name:   [name] [name] Date of Exam: 09/17/2022 Medical Rec #:  [id number]        Height:       67.0 in Accession #:    [id number]       Weight:       220.9 lb Date of Birth:  03-Mar-1941         BSA:          2.110 m Patient Age:    81 years         BP:           0/0 mmHg Patient Gender: M                HR:           88 bpm. Exam Location:  Inpatient Procedure: 2D Echo Indications:    Stroke  History:        Patient has no prior history of Echocardiogram examinations.  Sonographer:    Health visitor Referring Phys: [id number] Marvel Plan IMPRESSIONS  1. Left ventricular ejection fraction, by estimation, is 50 to 55%. The left ventricle has low normal function. The left ventricle has no regional wall motion abnormalities. There is moderate concentric left ventricular hypertrophy. Left ventricular diastolic parameters are consistent with Grade I diastolic dysfunction (impaired relaxation).  2. Right ventricular systolic function is normal. The right ventricular size is severely enlarged. There is normal pulmonary artery systolic pressure. The estimated right ventricular systolic pressure is 31.6 mmHg.  3. Left atrial size was severely dilated.  4. Right atrial size was severely dilated.  5. The mitral valve is degenerative. Trivial mitral valve regurgitation.  6. Decreased LV stroke volume index. DVI 0.33. Suspect moderate low flow low gradient aortic stenosis. The aortic valve is calcified. Aortic valve regurgitation is mild. Moderate aortic valve stenosis. Aortic valve mean gradient measures 25.0 mmHg. Aortic valve Vmax measures 3.11 m/s.  7. The inferior vena cava is dilated in size with <50% respiratory variability, suggesting right atrial pressure of 15 mmHg. Comparison(s): No prior Echocardiogram. FINDINGS  Left Ventricle: Left ventricular ejection fraction, by estimation, is 50 to 55%. The left ventricle has low normal function. The left ventricle has no regional wall motion abnormalities. The left ventricular internal cavity size was normal in size. There is moderate concentric left ventricular hypertrophy. Abnormal (paradoxical) septal motion, consistent with RV pacemaker. Left ventricular diastolic parameters are consistent with Grade I diastolic dysfunction (impaired relaxation). Right Ventricle: The right ventricular size is severely enlarged. No increase in right ventricular wall thickness. Right ventricular systolic function is normal. There is normal pulmonary artery systolic pressure.  The tricuspid regurgitant velocity is 2.04 m/s, and with an assumed right atrial pressure of 15 mmHg, the estimated right ventricular systolic pressure is 31.6 mmHg. Left Atrium: Left atrial size was severely dilated. Right Atrium: Right atrial size was severely dilated. Pericardium: There is no evidence of pericardial effusion. Mitral Valve: The mitral valve is degenerative in appearance. Trivial mitral valve regurgitation. Tricuspid Valve: The tricuspid valve is normal in structure. Tricuspid valve regurgitation is mild . No evidence of tricuspid stenosis. Aortic Valve: Decreased LV stroke volume index. DVI 0.33. Suspect moderate low flow low gradient aortic stenosis. The aortic valve is calcified. Aortic valve regurgitation is mild. Moderate aortic stenosis is present. Aortic valve mean gradient measures 25.0 mmHg. Aortic valve peak gradient measures 38.7 mmHg. Aortic valve area, by VTI measures 1.24 cm. Pulmonic Valve: The pulmonic valve was  not well visualized. Pulmonic valve regurgitation is not visualized. No evidence of pulmonic stenosis. Aorta: The aortic root is normal in size and structure. Venous: The inferior vena cava is dilated in size with less than 50% respiratory variability, suggesting right atrial pressure of 15 mmHg. IAS/Shunts: No atrial level shunt detected by color flow Doppler. Additional Comments: A device lead is visualized in the right ventricle and right atrium.  LEFT VENTRICLE PLAX 2D LVIDd:         4.60 cm      Diastology LVIDs:         3.50 cm      LV e' medial:    9.03 cm/s LV PW:         1.30 cm      LV E/e' medial:  13.5 LV IVS:        1.30 cm      LV e' lateral:   10.80 cm/s LVOT diam:     2.20 cm      LV E/e' lateral: 11.3 LV SV:         78 LV SV Index:   37 LVOT Area:     3.80 cm  LV Volumes (MOD) LV vol d, MOD A4C: 107.0 ml LV vol s, MOD A4C: 52.9 ml LV SV MOD A4C:     107.0 ml RIGHT VENTRICLE             IVC RV S prime:     16.40 cm/s  IVC diam: 3.20 cm TAPSE (M-mode): 1.3  cm LEFT ATRIUM              Index        RIGHT ATRIUM           Index LA Vol (A2C):   107.0 ml 50.72 ml/m  RA Area:     36.80 cm LA Vol (A4C):   106.0 ml 50.25 ml/m  RA Volume:   149.00 ml 70.63 ml/m LA Biplane Vol: 115.0 ml 54.52 ml/m  AORTIC VALVE AV Area (Vmax):    1.20 cm AV Area (Vmean):   1.11 cm AV Area (VTI):     1.24 cm AV Vmax:           311.00 cm/s AV Vmean:          222.500 cm/s AV VTI:            0.626 m AV Peak Grad:      38.7 mmHg AV Mean Grad:      25.0 mmHg LVOT Vmax:         97.90 cm/s LVOT Vmean:        65.100 cm/s LVOT VTI:          0.204 m LVOT/AV VTI ratio: 0.33  AORTA Ao Root diam: 3.50 cm MITRAL VALVE                TRICUSPID VALVE MV Area (PHT): 4.68 cm     TR Peak grad:   16.6 mmHg MV E velocity: 121.50 cm/s  TR Vmax:        204.00 cm/s MV A velocity: 31.10 cm/s MV E/A ratio:  3.91         SHUNTS                             Systemic VTI:  0.20 m  Systemic Diam: 2.20 cm Riley Lam MD Electronically signed by Riley Lam MD Signature Date/Time: 09/17/2022/1:18:14 PM    Final    CT ANGIO HEAD NECK W WO CM  Result Date: 09/16/2022 CLINICAL DATA:  Neuro deficit, acute, stroke suspected. Altered mental status. Right frontal infarct on CT today. EXAM: CT ANGIOGRAPHY HEAD AND NECK WITH AND WITHOUT CONTRAST TECHNIQUE: Multidetector CT imaging of the head and neck was performed using the standard protocol during bolus administration of intravenous contrast. Multiplanar CT image reconstructions and MIPs were obtained to evaluate the vascular anatomy. Carotid stenosis measurements (when applicable) are obtained utilizing NASCET criteria, using the distal internal carotid diameter as the denominator. RADIATION DOSE REDUCTION: This exam was performed according to the departmental dose-optimization program which includes automated exposure control, adjustment of the mA and/or kV according to patient size and/or use of iterative reconstruction  technique. CONTRAST:  75mL OMNIPAQUE IOHEXOL 350 MG/ML SOLN COMPARISON:  None Available. FINDINGS: CTA NECK FINDINGS Aortic arch: Standard 3 vessel aortic arch with mild atherosclerotic plaque. No significant stenosis of the arch vessel origins. Right carotid system: Patent with extensive calcified and soft plaque at the carotid bifurcation and in the proximal ICA resulting in up to 55% stenosis of the proximal ICA. Tortuous mid cervical ICA. Left carotid system: Patent with scattered calcified and soft plaque in the mid common carotid artery without significant associated stenosis. More prominent calcified plaque at the carotid bifurcation and in the proximal ICA resulting in 60% stenosis of the proximal ICA. Tortuous distal cervical ICA. Vertebral arteries: Patent without evidence of significant stenosis or dissection. Skeleton: Advanced cervical disc and facet degeneration. Other neck: No evidence of cervical lymphadenopathy or mass. 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 Upper chest: No mass or consolidation in the included lung apices. Review of the MIP images confirms the above findings CTA HEAD FINDINGS Anterior circulation: The internal carotid arteries are patent from skull base to carotid termini with mild atherosclerotic plaque not resulting in significant stenosis. The right M1 segment is widely patent, however there is a severe stenosis or filling defect/subocclusive embolus involving a right M2 superior division branch vessel corresponding to the infarct on today's earlier CT (series 7, image 230 and series 11, image 22). There is attenuation of more distal right MCA branch vessels. The left MCA and both ACAs are patent without evidence of a significant proximal stenosis. No aneurysm is identified. Posterior circulation: The intracranial vertebral arteries are patent to the basilar with the right being moderately dominant. Calcified atherosclerosis results in at most mild right V4  stenosis. Patent AICA and SCA origins are seen bilaterally. The basilar artery is widely patent. There are small posterior communicating arteries bilaterally. Both PCAs are patent without evidence of a significant proximal stenosis on the right. There is a severe left P1 stenosis. No aneurysm is identified. Venous sinuses: Patent. Anatomic variants: None. Review of the MIP images confirms the above findings IMPRESSION: 1. Suspected subocclusive embolus in a right M2 superior division branch vessel corresponding to the infarct on today's earlier head CT. 2. Severe left P1 stenosis. 3. 55% proximal right ICA stenosis. 4. 60% proximal left ICA stenosis. 5.  Aortic Atherosclerosis (ICD10-I70.0). Electronically Signed   By: Sebastian Ache M.D.   On: 09/16/2022 22:03   DG Chest Port 1 View  Result Date: 09/16/2022 CLINICAL DATA:  Altered mental status EXAM: PORTABLE CHEST 1 VIEW COMPARISON:  10/26/2019 FINDINGS: Post sternotomy changes. Left-sided pacing device as before. Cardiomegaly. No acute airspace disease, pleural effusion or pneumothorax.  IMPRESSION: No active disease. Cardiomegaly. Electronically Signed   By: Jasmine Pang M.D.   On: 09/16/2022 19:39   CT HEAD WO CONTRAST  Result Date: 09/16/2022 CLINICAL DATA:  Mental status change, unknown cause EXAM: CT HEAD WITHOUT CONTRAST TECHNIQUE: Contiguous axial images were obtained from the base of the skull through the vertex without intravenous contrast. RADIATION DOSE REDUCTION: This exam was performed according to the departmental dose-optimization program which includes automated exposure control, adjustment of the mA and/or kV according to patient size and/or use of iterative reconstruction technique. COMPARISON:  None Available. FINDINGS: Brain: Hypoattenuation and loss of gray white differentiation in the inferior right frontal lobe, suspicious for acute or subacute infarct. No mass occupying acute hemorrhage or significant mass effect. No midline shift.  No hydrocephalus. Vascular: No hyperdense vessel. Skull: No acute fracture. Sinuses/Orbits: Paranasal sinus mucosal thickening, greatest in the right sphenoid sinus. No acute orbital findings. Other: No mastoid effusions. IMPRESSION: Potentially acute or subacute infarct in the right frontal lobe. Recommend MRI to further evaluate. Electronically Signed   By: Feliberto Harts M.D.   On: 09/16/2022 17:22   DG Elbow Complete Right  Result Date: 08/27/2022 CLINICAL DATA:  RIGHT elbow pain for 1 day. EXAM: RIGHT ELBOW - COMPLETE 3+ VIEW COMPARISON:  None Available. FINDINGS: An elbow effusion is noted. There is no evidence of acute fracture, subluxation or dislocation. Moderate degenerative changes at the humeral ulnar joint noted. No focal bony lesions are present. IMPRESSION: 1. Elbow effusion without acute bony abnormality. 2. Moderate degenerative changes at the humeral ulnar joint. Electronically Signed   By: Harmon Pier M.D.   On: 08/27/2022 15:38     PHYSICAL EXAM  Temp:  [97.8 F (36.6 C)-98.8 F (37.1 C)] 97.8 F (36.6 C) (05/02 1207) Pulse Rate:  [71-80] 71 (05/02 1207) Resp:  [16-20] 16 (05/02 1207) BP: (125-134)/(66-74) 134/74 (05/02 1207) SpO2:  [94 %-95 %] 95 % (05/02 1207)  General - Well nourished, well developed, in no apparent distress.  Ophthalmologic - fundi not visualized due to noncooperation.  Cardiovascular - regular rate and rhythm, not in afib now  Neuro - Awake alert, eyes open, he is orientated to wife, month and place but not to age or year. No aphasia, following all simple commands. Able to name and repeat. Difficulty with WORLD backward spelling, but no apraxia on exam. No gaze palsy, tracking bilaterally, visual field full.  Slight left facial droop. Tongue midline. Bilateral UEs 5/5, no drift. Bilaterally LEs 4/5, no drift. Sensation symmetrical bilaterally, b/l FTN intact, gait not tested.    ASSESSMENT/PLAN [name] [name] is a 82 y.o. male with  history of MCI, CHF, CAD status post CABG 2015, pulmonary hypertension, A-fib on Coumadin, hypertension, hyperlipidemia, OSA, pacemaker, polycythemia with positive JAK2 on hydroxyurea, lower back pain status post fusion surgery admitted for confusion, ataxia, fatigue. No tPA given due to outside window.  Stroke:  right frontotemporal infarct embolic secondary to A-fib on Coumadin with subtherapeutic INR CT concerning right frontal infarct CT head and neck right M2 subocclusive embolus, left P1 severe stenosis, right ICA 50% stenosis, left ICA 60% stenosis MRI monitor performed due to incompatible ICD leads CT repeat continued evolution of the infarct in the right frontal operculum 2D Echo EF 50 to 55%, left atrium size severely dilated. LDL 100 HgbA1c 5.6 Eliquis for VTE prophylaxis warfarin daily prior to admission, now on Eliquis (apixaban) daily.  Continue Eliquis on discharge Patient counseled to be compliant with his antithrombotic medications  Ongoing aggressive stroke risk factor management Therapy recommendations: Home health PT and OT Disposition: Pending  Chronic A-fib On Coumadin PTA Coumadin was on hold for 5 days to 3 weeks ago for EGD and colonoscopy, resumed after INR on admission 1.9 Discussed with wife, will switch to Eliquis  Hypertension Stable Long term BP goal normotensive  Hyperlipidemia Home meds: Crestor 40 and Zetia LDL 100, goal < 70 Now on Crestor 40 and Zetia Continue statin at discharge Recommend to follow-up with lipid clinic to consider PCSK9 inhibitors  Other Stroke Risk Factors Advanced age Obesity, Body mass index is 34.6 kg/m.  Hx stroke/TIA Coronary artery disease status post CABG in 2015, continue home aspirin 81 Obstructive sleep apnea, on CPAP at home Polycythemia with positive JAK2, continue hydroxyurea  Other Active Problems Pacemaker/AICD Pulmonary hypertension MCI Left BBB Lower back pain status post fusion surgery  Hospital  day # 2  Neurology will sign off. Please call with questions. Pt will follow up with Dr. Vickey Huger at [facility] in about 4 weeks. Thanks for the consult.   Marvel Plan, MD PhD Stroke Neurology 09/18/2022 2:17 PM       To contact Stroke Continuity provider, please refer to [email or web address]. After hours, contact General Neurology

## 2022-09-21 NOTE — Discharge Summary (Signed)
Physician Discharge Summary   Patient: [name] [name] MRN: [id number] DOB: 02-03-1941  Admit date:     09/16/2022  Discharge date: 09/18/2022  Discharge Physician: Kathlen Mody   PCP: Suzan Slick, MD   Recommendations at discharge:  Please follow up with PCP in one week.  Please follow up with neurology as recommended.  Please check cbc and bmp in one week.   Discharge Diagnoses: Principal Problem:   Acute CVA (cerebrovascular accident) (HCC) Active Problems:   HTN (hypertension)   GERD    BPH (benign prostatic hyperplasia)   Atrial fibrillation (HCC)   Hypothyroidism   Polycythemia vera (HCC)   S/P CABG x 3   Ischemic cardiomyopathy   Gout   Chronic diastolic CHF (congestive heart failure) [facility])    Hospital Course: [name] [name] is a 82 y.o. male with medical history significant for atrial fibrillation on chronic anticoagulation therapy with Coumadin, chronic diastolic dysfunction CHF status post AICD insertion, history of coronary artery disease status post CABG, COPD, hypertension, dyslipidemia, sleep apnea who was referred to the emergency room by his oncologist for evaluation of mental status changes.   Assessment and Plan:   * Acute CVA (cerebrovascular accident) [facility]) Patient presented to the ER for evaluation of confusion and had a CT scan of the head done without contrast which showed potentially acute or subacute infarct in the right frontal lobe.  Unable to do MRI due to incompatibility.  Repeat CT head showed evolution of the stroke without any intracranial hemorrhage.   Neurology consulted, changed the coumadin to eliquis. Added aspirin 81 mg daily.  Currently on zetia and crestor 40 mg daily.  Discussed the plan with spouse at bedside.     Chronic diastolic CHF (congestive heart failure) (HCC) Resume home meds. No signs of exacerbation at this time. Denies any sob or chest pain.  Last 2D echocardiogram from 11/23 showed an LVEF of 55 to 60%.     Gout Continue allopurinol   Ischemic cardiomyopathy Status post AICD insertion Repeat 2D echocardiogram shows an improvement in his LVEF to 55 to 60%   S/P CABG x 3 History of coronary artery disease status post CABG  Continue aspirin, high intensity statin    Polycythemia vera (HCC) Continue Hydrea Follow-up with oncology as an outpatient   Hypothyroidism Continue Synthroid 200 mcg daily.    Atrial fibrillation (HCC) Paroxysmal, currently rate controlled.  Will restart metoprolol in the next 24 hours.  Coumadin discontinued and changed to Eliquis    BPH (benign prostatic hyperplasia) Continue Flomax and finasteride   GERD  Continue PPI   HTN (hypertension) Well controlled.    Acute encephalopathy: appears to have resolved.  He is alert and answering all questions appropriately.  Looking forward to going home.        Estimated body mass index is 34.6 kg/m as calculated from the following:   Height as of this encounter: 5\' 7"  (1.702 m).   Weight as of this encounter: 100.2 kg.  Consultants: neurology.  Procedures performed: repeat CT head.   Disposition: Home Diet recommendation:  Discharge Diet Orders (From admission, onward)     Start     Ordered   09/18/22 0000  Diet - low sodium heart healthy        09/18/22 1259           Cardiac diet DISCHARGE MEDICATION: Allergies as of 09/18/2022       Reactions   Other Swelling   SODIUM SENSITIVE  Medication List     STOP taking these medications    predniSONE 10 MG (21) Tbpk tablet Commonly known as: STERAPRED UNI-PAK 21 TAB   warfarin 5 MG tablet Commonly known as: COUMADIN       TAKE these medications    acetaminophen 500 MG tablet Commonly known as: TYLENOL Take 500 mg by mouth as needed for mild pain.   allopurinol 300 MG tablet Commonly known as: ZYLOPRIM TAKE 1 TABLET EVERY DAY TO PREVENT GOUT What changed:  how much to take how to take this when to take  this additional instructions   aspirin EC 81 MG tablet Take 81 mg by mouth daily.   B-12 1000 MCG Caps Take 1 capsule by mouth daily.   Biotin [address] MCG Tabs Take 1 tablet by mouth daily.   DHA OMEGA 3 PO Take 1 tablet by mouth daily.   docusate sodium 100 MG capsule Commonly known as: COLACE Take 100 mg by mouth 2 (two) times daily. 1 tablet in the morning and 1 tablet at bedtime (stool softener)   Eliquis 5 MG Tabs tablet Generic drug: apixaban Take 1 tablet (5 mg total) by mouth 2 (two) times daily.   ezetimibe 10 MG tablet Commonly known as: Zetia Take 1 tab daily at night for cholesterol. What changed:  how much to take how to take this when to take this additional instructions   finasteride 5 MG tablet Commonly known as: PROSCAR TAKE 1 TABLET EVERY DAY FOR PROSTATE What changed: See the new instructions.   hydroxyurea 500 MG capsule Commonly known as: HYDREA TAKE 2 CAPSULES ONE TIME DAILY WITH FOOD What changed:  how much to take how to take this when to take this additional instructions   isosorbide mononitrate 30 MG 24 hr tablet Commonly known as: IMDUR Take 1 tablet (30 mg total) by mouth daily. Take by mouth daily. What changed: additional instructions   levothyroxine 200 MCG tablet Commonly known as: SYNTHROID TAKE 1 TAB DAILY ON EMPTY STOMACH WITH ONLY WATER FOR 30 MINS. NO ANTACIDS, CALCIUM OR MAGNESIUM FOR 4 HOURS. AVOID BIOTIN. What changed: See the new instructions.   magnesium oxide 400 MG tablet Commonly known as: MAG-OX Take 400 mg by mouth daily.   MELATONIN PO Take 1 tablet by mouth daily as needed (For sleep).   metoprolol 200 MG 24 hr tablet Commonly known as: TOPROL-XL Take 200 mg by mouth daily.   multivitamin tablet Take 1 tablet by mouth daily.   omeprazole 20 MG capsule Commonly known as: PRILOSEC TAKE 1 CAPSULE TWICE DAILY  ( EVERY 12 HOURS  ) TO PREVENT HEARTBURN AND ACID REFLUX What changed:  how much to  take how to take this when to take this additional instructions   PROBIOTIC-10 PO Take 1 tablet by mouth daily.   rosuvastatin 40 MG tablet Commonly known as: CRESTOR Take 1 tablet (40 mg total) by mouth daily.   tamsulosin 0.4 MG Caps capsule Commonly known as: FLOMAX Take 1 tablet at Bedtime for Prostate & Bladder What changed:  how much to take how to take this when to take this additional instructions   torsemide 20 MG tablet Commonly known as: DEMADEX Take 1 tablet every Morning for Swelling in legs What changed:  how much to take how to take this when to take this additional instructions   Zinc 50 MG Tabs Take 1 tablet by mouth daily.        Follow-up Information     Care,  [facility] Follow up.   Specialty: Home Health Services Why: The home health agency will contact you for the first home visit. Contact information: [address] [place] [address] [phone]         Dohmeier, Porfirio Mylar, MD. Schedule an appointment as soon as possible for a visit in 1 month(s).   Specialty: Neurology Contact information: [address] [place] [address] [phone]                Discharge Exam: Ceasar Mons Weights   09/16/22 2240  Weight: 100.2 kg   General exam: Appears calm and comfortable  Respiratory system: Clear to auscultation. Respiratory effort normal. Cardiovascular system: S1 & S2 heard, RRR. No JVD,  Gastrointestinal system: Abdomen is nondistended, soft and nontender.  Central nervous system: Alert and oriented. No focal neurological deficits. Extremities: Symmetric 5 x 5 power. Skin: No rashes, lesions or ulcers Psychiatry: Mood & affect appropriate.    Condition at discharge: fair  The results of significant diagnostics from this hospitalization (including imaging, microbiology, ancillary and laboratory) are listed below for reference.   Imaging Studies: CT HEAD WO CONTRAST ( )  Result  Date: 09/17/2022 CLINICAL DATA:  Stroke, follow-up. EXAM: CT HEAD WITHOUT CONTRAST TECHNIQUE: Contiguous axial images were obtained from the base of the skull through the vertex without intravenous contrast. RADIATION DOSE REDUCTION: This exam was performed according to the departmental dose-optimization program which includes automated exposure control, adjustment of the mA and/or kV according to patient size and/or use of iterative reconstruction technique. COMPARISON:  Head CT and CTA head/neck 09/16/2022. FINDINGS: Brain: Continued evolution of the previously described infarct in the right frontal operculum. No new infarct is seen. No acute intracranial hemorrhage. No hydrocephalus or extra-axial collection. No mass effect or midline shift. Vascular: No new hyperdense vessel or unexpected calcification. Skull: No calvarial fracture or suspicious bone lesion. Skull base is unremarkable. Sinuses/Orbits: Unchanged chronic right sphenoid sinusitis. Orbits are unremarkable. Other: None. IMPRESSION: Continued evolution of the previously described infarct in the right frontal operculum. No new infarct or acute intracranial hemorrhage. Electronically Signed   By: Orvan Falconer M.D.   On: 09/17/2022 15:01   ECHOCARDIOGRAM COMPLETE  Result Date: 09/17/2022    ECHOCARDIOGRAM REPORT   Patient Name:   DOLLY DEYO Date of Exam: 09/17/2022 Medical Rec #:  [id number]        Height:       67.0 in Accession #:    [id number]       Weight:       220.9 lb Date of Birth:  1940/05/29         BSA:          2.110 m Patient Age:    81 years         BP:           0/0 mmHg Patient Gender: M                HR:           88 bpm. Exam Location:  Inpatient Procedure: 2D Echo Indications:    Stroke  History:        Patient has no prior history of Echocardiogram examinations.  Sonographer:    Data processing manager Referring Phys: [id number] Marvel Plan IMPRESSIONS  1. Left ventricular ejection fraction, by estimation, is 50 to 55%. The  left ventricle has low normal function. The left ventricle has no regional wall motion abnormalities. There is moderate  concentric left ventricular hypertrophy. Left ventricular diastolic parameters are consistent with Grade I diastolic dysfunction (impaired relaxation).  2. Right ventricular systolic function is normal. The right ventricular size is severely enlarged. There is normal pulmonary artery systolic pressure. The estimated right ventricular systolic pressure is 31.6 mmHg.  3. Left atrial size was severely dilated.  4. Right atrial size was severely dilated.  5. The mitral valve is degenerative. Trivial mitral valve regurgitation.  6. Decreased LV stroke volume index. DVI 0.33. Suspect moderate low flow low gradient aortic stenosis. The aortic valve is calcified. Aortic valve regurgitation is mild. Moderate aortic valve stenosis. Aortic valve mean gradient measures 25.0 mmHg. Aortic valve Vmax measures 3.11 m/s.  7. The inferior vena cava is dilated in size with <50% respiratory variability, suggesting right atrial pressure of 15 mmHg. Comparison(s): No prior Echocardiogram. FINDINGS  Left Ventricle: Left ventricular ejection fraction, by estimation, is 50 to 55%. The left ventricle has low normal function. The left ventricle has no regional wall motion abnormalities. The left ventricular internal cavity size was normal in size. There is moderate concentric left ventricular hypertrophy. Abnormal (paradoxical) septal motion, consistent with RV pacemaker. Left ventricular diastolic parameters are consistent with Grade I diastolic dysfunction (impaired relaxation). Right Ventricle: The right ventricular size is severely enlarged. No increase in right ventricular wall thickness. Right ventricular systolic function is normal. There is normal pulmonary artery systolic pressure. The tricuspid regurgitant velocity is 2.04 m/s, and with an assumed right atrial pressure of 15 mmHg, the estimated right ventricular  systolic pressure is 31.6 mmHg. Left Atrium: Left atrial size was severely dilated. Right Atrium: Right atrial size was severely dilated. Pericardium: There is no evidence of pericardial effusion. Mitral Valve: The mitral valve is degenerative in appearance. Trivial mitral valve regurgitation. Tricuspid Valve: The tricuspid valve is normal in structure. Tricuspid valve regurgitation is mild . No evidence of tricuspid stenosis. Aortic Valve: Decreased LV stroke volume index. DVI 0.33. Suspect moderate low flow low gradient aortic stenosis. The aortic valve is calcified. Aortic valve regurgitation is mild. Moderate aortic stenosis is present. Aortic valve mean gradient measures 25.0 mmHg. Aortic valve peak gradient measures 38.7 mmHg. Aortic valve area, by VTI measures 1.24 cm. Pulmonic Valve: The pulmonic valve was not well visualized. Pulmonic valve regurgitation is not visualized. No evidence of pulmonic stenosis. Aorta: The aortic root is normal in size and structure. Venous: The inferior vena cava is dilated in size with less than 50% respiratory variability, suggesting right atrial pressure of 15 mmHg. IAS/Shunts: No atrial level shunt detected by color flow Doppler. Additional Comments: A device lead is visualized in the right ventricle and right atrium.  LEFT VENTRICLE PLAX 2D LVIDd:         4.60 cm      Diastology LVIDs:         3.50 cm      LV e' medial:    9.03 cm/s LV PW:         1.30 cm      LV E/e' medial:  13.5 LV IVS:        1.30 cm      LV e' lateral:   10.80 cm/s LVOT diam:     2.20 cm      LV E/e' lateral: 11.3 LV SV:         78 LV SV Index:   37 LVOT Area:     3.80 cm  LV Volumes (MOD) LV vol d, MOD A4C: 107.0 ml LV  vol s, MOD A4C: 52.9 ml LV SV MOD A4C:     107.0 ml RIGHT VENTRICLE             IVC RV S prime:     16.40 cm/s  IVC diam: 3.20 cm TAPSE (M-mode): 1.3 cm LEFT ATRIUM              Index        RIGHT ATRIUM           Index LA Vol (A2C):   107.0 ml 50.72 ml/m  RA Area:     36.80 cm LA  Vol (A4C):   106.0 ml 50.25 ml/m  RA Volume:   149.00 ml 70.63 ml/m LA Biplane Vol: 115.0 ml 54.52 ml/m  AORTIC VALVE AV Area (Vmax):    1.20 cm AV Area (Vmean):   1.11 cm AV Area (VTI):     1.24 cm AV Vmax:           311.00 cm/s AV Vmean:          222.500 cm/s AV VTI:            0.626 m AV Peak Grad:      38.7 mmHg AV Mean Grad:      25.0 mmHg LVOT Vmax:         97.90 cm/s LVOT Vmean:        65.100 cm/s LVOT VTI:          0.204 m LVOT/AV VTI ratio: 0.33  AORTA Ao Root diam: 3.50 cm MITRAL VALVE                TRICUSPID VALVE MV Area (PHT): 4.68 cm     TR Peak grad:   16.6 mmHg MV E velocity: 121.50 cm/s  TR Vmax:        204.00 cm/s MV A velocity: 31.10 cm/s MV E/A ratio:  3.91         SHUNTS                             Systemic VTI:  0.20 m                             Systemic Diam: 2.20 cm Riley Lam MD Electronically signed by Riley Lam MD Signature Date/Time: 09/17/2022/1:18:14 PM    Final    CT ANGIO HEAD NECK W WO CM  Result Date: 09/16/2022 CLINICAL DATA:  Neuro deficit, acute, stroke suspected. Altered mental status. Right frontal infarct on CT today. EXAM: CT ANGIOGRAPHY HEAD AND NECK WITH AND WITHOUT CONTRAST TECHNIQUE: Multidetector CT imaging of the head and neck was performed using the standard protocol during bolus administration of intravenous contrast. Multiplanar CT image reconstructions and MIPs were obtained to evaluate the vascular anatomy. Carotid stenosis measurements (when applicable) are obtained utilizing NASCET criteria, using the distal internal carotid diameter as the denominator. RADIATION DOSE REDUCTION: This exam was performed according to the departmental dose-optimization program which includes automated exposure control, adjustment of the mA and/or kV according to patient size and/or use of iterative reconstruction technique. CONTRAST:  75mL OMNIPAQUE IOHEXOL 350 MG/ML SOLN COMPARISON:  None Available. FINDINGS: CTA NECK FINDINGS Aortic arch: Standard 3  vessel aortic arch with mild atherosclerotic plaque. No significant stenosis of the arch vessel origins. Right carotid system: Patent with extensive calcified and soft plaque at the carotid bifurcation and in the proximal  ICA resulting in up to 55% stenosis of the proximal ICA. Tortuous mid cervical ICA. Left carotid system: Patent with scattered calcified and soft plaque in the mid common carotid artery without significant associated stenosis. More prominent calcified plaque at the carotid bifurcation and in the proximal ICA resulting in 60% stenosis of the proximal ICA. Tortuous distal cervical ICA. Vertebral arteries: Patent without evidence of significant stenosis or dissection. Skeleton: Advanced cervical disc and facet degeneration. Other neck: No evidence of cervical lymphadenopathy or mass. 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 Upper chest: No mass or consolidation in the included lung apices. Review of the MIP images confirms the above findings CTA HEAD FINDINGS Anterior circulation: The internal carotid arteries are patent from skull base to carotid termini with mild atherosclerotic plaque not resulting in significant stenosis. The right M1 segment is widely patent, however there is a severe stenosis or filling defect/subocclusive embolus involving a right M2 superior division branch vessel corresponding to the infarct on today's earlier CT (series 7, image 230 and series 11, image 22). There is attenuation of more distal right MCA branch vessels. The left MCA and both ACAs are patent without evidence of a significant proximal stenosis. No aneurysm is identified. Posterior circulation: The intracranial vertebral arteries are patent to the basilar with the right being moderately dominant. Calcified atherosclerosis results in at most mild right V4 stenosis. Patent AICA and SCA origins are seen bilaterally. The basilar artery is widely patent. There are small posterior communicating arteries  bilaterally. Both PCAs are patent without evidence of a significant proximal stenosis on the right. There is a severe left P1 stenosis. No aneurysm is identified. Venous sinuses: Patent. Anatomic variants: None. Review of the MIP images confirms the above findings IMPRESSION: 1. Suspected subocclusive embolus in a right M2 superior division branch vessel corresponding to the infarct on today's earlier head CT. 2. Severe left P1 stenosis. 3. 55% proximal right ICA stenosis. 4. 60% proximal left ICA stenosis. 5.  Aortic Atherosclerosis (ICD10-I70.0). Electronically Signed   By: Sebastian Ache M.D.   On: 09/16/2022 22:03   DG Chest Port 1 View  Result Date: 09/16/2022 CLINICAL DATA:  Altered mental status EXAM: PORTABLE CHEST 1 VIEW COMPARISON:  10/26/2019 FINDINGS: Post sternotomy changes. Left-sided pacing device as before. Cardiomegaly. No acute airspace disease, pleural effusion or pneumothorax. IMPRESSION: No active disease. Cardiomegaly. Electronically Signed   By: Jasmine Pang M.D.   On: 09/16/2022 19:39   CT HEAD WO CONTRAST  Result Date: 09/16/2022 CLINICAL DATA:  Mental status change, unknown cause EXAM: CT HEAD WITHOUT CONTRAST TECHNIQUE: Contiguous axial images were obtained from the base of the skull through the vertex without intravenous contrast. RADIATION DOSE REDUCTION: This exam was performed according to the departmental dose-optimization program which includes automated exposure control, adjustment of the mA and/or kV according to patient size and/or use of iterative reconstruction technique. COMPARISON:  None Available. FINDINGS: Brain: Hypoattenuation and loss of gray white differentiation in the inferior right frontal lobe, suspicious for acute or subacute infarct. No mass occupying acute hemorrhage or significant mass effect. No midline shift. No hydrocephalus. Vascular: No hyperdense vessel. Skull: No acute fracture. Sinuses/Orbits: Paranasal sinus mucosal thickening, greatest in the  right sphenoid sinus. No acute orbital findings. Other: No mastoid effusions. IMPRESSION: Potentially acute or subacute infarct in the right frontal lobe. Recommend MRI to further evaluate. Electronically Signed   By: Feliberto Harts M.D.   On: 09/16/2022 17:22   DG Elbow Complete Right  Result Date: 08/27/2022 CLINICAL DATA:  RIGHT elbow pain for 1 day. EXAM: RIGHT ELBOW - COMPLETE 3+ VIEW COMPARISON:  None Available. FINDINGS: An elbow effusion is noted. There is no evidence of acute fracture, subluxation or dislocation. Moderate degenerative changes at the humeral ulnar joint noted. No focal bony lesions are present. IMPRESSION: 1. Elbow effusion without acute bony abnormality. 2. Moderate degenerative changes at the humeral ulnar joint. Electronically Signed   By: Harmon Pier M.D.   On: 08/27/2022 15:38    Microbiology: No results found for this or any previous visit.  Labs: CBC: Recent Labs  Lab 09/16/22 1223 09/18/22 0656  WBC 13.1* 10.8*  NEUTROABS 11.6* 8.8*  HGB 16.1 14.9  HCT 48.5 45.2  MCV 111.8* 111.3*  PLT 404* 363   Basic Metabolic Panel: Recent Labs  Lab 09/16/22 1223 09/16/22 1412 09/18/22 0656  NA 135  --  134*  K 4.0  --  3.9  CL 99  --  99  CO2 31  --  26  GLUCOSE 121*  --  120*  BUN 12  --  14  CREATININE 0.90  --  1.03  CALCIUM 9.8  --  9.1  MG  --  1.7  --    Liver Function Tests: Recent Labs  Lab 09/16/22 1223  AST 37  ALT 53*  ALKPHOS 71  BILITOT 1.5*  PROT 6.7  ALBUMIN 4.3   CBG: Recent Labs  Lab 09/16/22 1442  GLUCAP 126*    Discharge time spent: 45 minutes.   Signed: Kathlen Mody, MD [facility] 09/21/2022

## 2022-09-22 ENCOUNTER — Encounter: Payer: Self-pay | Admitting: Hematology

## 2022-09-22 ENCOUNTER — Telehealth: Payer: Self-pay

## 2022-09-22 NOTE — Patient Outreach (Signed)
  Emmi Stroke Care Coordination Follow Up  09/22/2022 Name:  [name] [name] MRN:  [id number] DOB:  11-13-1940  Subjective: [name] [name] is a 82 y.o. year old male who is a primary care patient of Rucker, Magdalen Spatz, MD An Emmi alert was received indicating patient responded to questions: Scheduled a follow-up appointment?. I reached out by phone to follow up on the alert and spoke to Caregiver.Wife [name]. She states patient doing good except for some confusion.  She states they are wanting rehab outside the home and that she declined [place]. Discussed follow up appointment after hospitalization and that outpatient therapy can be requested at the appointment.  She verbalized understanding and declines needing assistance to set up appointment. No problems with medications.    Care Coordination Interventions:  Yes, provided   Follow up plan: No further intervention required.   Encounter Outcome:  Pt. Visit Completed   Bary Leriche, RN, MSN [facility] Care Management Care Management Coordinator Direct Line [phone]

## 2022-09-22 NOTE — Patient Outreach (Signed)
Received a red flag Emmi stroke notification for [name] [name] . I have assigned Antionette Fairy, RN to call for follow up and determine if there are any Case Management needs.    Iverson Alamin, Donivan Scull [facility] Care Management Assistant [facility] Care Management [phone]

## 2022-09-23 ENCOUNTER — Encounter: Payer: Self-pay | Admitting: Family Medicine

## 2022-09-23 ENCOUNTER — Ambulatory Visit: Payer: Medicare Other | Admitting: Family Medicine

## 2022-09-23 VITALS — BP 111/70 | HR 70 | Temp 97.9°F | Resp 18 | Ht 67.0 in | Wt 218.5 lb

## 2022-09-23 DIAGNOSIS — I69319 Unspecified symptoms and signs involving cognitive functions following cerebral infarction: Secondary | ICD-10-CM

## 2022-09-23 DIAGNOSIS — I639 Cerebral infarction, unspecified: Secondary | ICD-10-CM

## 2022-09-23 DIAGNOSIS — I480 Paroxysmal atrial fibrillation: Secondary | ICD-10-CM

## 2022-09-23 DIAGNOSIS — Z09 Encounter for follow-up examination after completed treatment for conditions other than malignant neoplasm: Secondary | ICD-10-CM

## 2022-09-23 NOTE — Progress Notes (Signed)
Established Patient Office Visit  Subjective   Patient ID: [name] [name], male    DOB: Jan 22, 1941  Age: 82 y.o. MRN: [id number]  Chief Complaint  Patient presents with   Hospitalization Follow-up    [name] [name] 4/30-09/18/2022 Acute CVA    HPI  Pt is here with wife who gives history. She is a retired Engineer, civil (consulting)  Wife reports on the Monday of 4/29, pt had difficulty setting up the coffee pot that he normally does. She reports he has some mild dementia and didn't think anything of it. She reports he usually sits in the recliner to watch tv but that night, he wanted to lay down due to fatigue. Wife reports she is with [employer] and usually up at 8am. He is usually up and he had appt that morning. She goes to check on him and found him on the floor. She couldn't get him up so she called 911. They came and got him up and left. Wife is still thinking the dementia. They get prepared for the appointment with oncologist Dr Candise Che.  Wife reports he got himself dressed with no problems.  They get to Dr Clyda Greener office and asked pt if he knows who he is. Pt said no and pt was sent to ER. He had ct scan that showed acute to subacute right frontal lobe CVA. He had all medications continued except for discontinuing the Warfarin and pt was placed on Aspirin 81 mg and Eliquis 5 mg BID. Pt was discharged on 09/18/22.   Review of Systems  Psychiatric/Behavioral:  Positive for memory loss.        Acute on chronic worsening memory loss since CVA and speech production deficit  All other systems reviewed and are negative.    Objective:     BP 111/70   Pulse 70   Temp 97.9 F (36.6 C) (Oral)   Resp 18   Ht 5\' 7"  (1.702 m)   Wt 218 lb 8 oz (99.1 kg)   SpO2 96%   BMI 34.22 kg/m  BP Readings from Last 3 Encounters:  09/23/22 111/70  09/18/22 134/74  09/16/22 108/72      Physical Exam Vitals and nursing note reviewed.  Constitutional:      Appearance: Normal appearance. He is normal weight.  HENT:      Head: Normocephalic and atraumatic.     Right Ear: External ear normal.     Left Ear: External ear normal.     Nose: Nose normal.     Mouth/Throat:     Mouth: Mucous membranes are moist.     Pharynx: Oropharynx is clear.  Cardiovascular:     Rate and Rhythm: Normal rate.     Pulses: Normal pulses.  Pulmonary:     Effort: Pulmonary effort is normal.  Abdominal:     General: Abdomen is flat. Bowel sounds are normal.  Skin:    General: Skin is warm.     Capillary Refill: Capillary refill takes less than 2 seconds.  Neurological:     Mental Status: He is alert.  Psychiatric:        Mood and Affect: Mood normal.     Comments: Flat affect with minimal conversing during today's visit.    No results found for any visits on 09/23/22.     The ASCVD Risk score (Arnett DK, et al., 2019) failed to calculate for the following reasons:   The 2019 ASCVD risk score is only valid for ages 80 to 34  The patient has a prior MI or stroke diagnosis    Assessment & Plan:   Problem List Items Addressed This Visit   None Hospital discharge follow-up  Acute CVA (cerebrovascular accident) (HCC) -     CBC with Differential/Platelet -     Comprehensive metabolic panel -     Referral to Neuro Rehab  Paroxysmal atrial fibrillation (HCC)  Cognitive deficit due to recent stroke -     Referral to Neuro Rehab   Recheck CBC and CMP per hospital discharge advisement. Continue current regimen Refer to Neuro Rehab at [place] See back in July as scheduled.  No follow-ups on file.    Suzan Slick, MD

## 2022-09-24 LAB — COMPREHENSIVE METABOLIC PANEL
ALT: 44 IU/L (ref 0–44)
AST: 33 IU/L (ref 0–40)
Albumin/Globulin Ratio: 1.8 (ref 1.2–2.2)
Albumin: 4.3 g/dL (ref 3.7–4.7)
Alkaline Phosphatase: 77 IU/L (ref 44–121)
BUN/Creatinine Ratio: 13 (ref 10–24)
BUN: 14 mg/dL (ref 8–27)
Bilirubin Total: 1.1 mg/dL (ref 0.0–1.2)
CO2: 27 mmol/L (ref 20–29)
Calcium: 9.5 mg/dL (ref 8.6–10.2)
Chloride: 95 mmol/L — ABNORMAL LOW (ref 96–106)
Creatinine, Ser: 1.05 mg/dL (ref 0.76–1.27)
Globulin, Total: 2.4 g/dL (ref 1.5–4.5)
Glucose: 119 mg/dL — ABNORMAL HIGH (ref 70–99)
Potassium: 5.3 mmol/L — ABNORMAL HIGH (ref 3.5–5.2)
Sodium: 136 mmol/L (ref 134–144)
Total Protein: 6.7 g/dL (ref 6.0–8.5)
eGFR: 71 mL/min/{1.73_m2} (ref >59)

## 2022-09-24 LAB — CBC WITH DIFFERENTIAL/PLATELET
Basophils Absolute: 0.1 10*3/uL (ref 0.0–0.2)
Basos: 1 %
EOS (ABSOLUTE): 0.1 10*3/uL (ref 0.0–0.4)
Eos: 1 %
Hematocrit: 43 % (ref 37.5–51.0)
Hemoglobin: 15.1 g/dL (ref 13.0–17.7)
Immature Grans (Abs): 0.1 10*3/uL (ref 0.0–0.1)
Immature Granulocytes: 1 %
Lymphocytes Absolute: 1.5 10*3/uL (ref 0.7–3.1)
Lymphs: 14 %
MCH: 36.7 pg — ABNORMAL HIGH (ref 26.6–33.0)
MCHC: 35.1 g/dL (ref 31.5–35.7)
MCV: 104 fL — ABNORMAL HIGH (ref 79–97)
Monocytes Absolute: 0.6 10*3/uL (ref 0.1–0.9)
Monocytes: 5 %
Neutrophils Absolute: 8.1 10*3/uL — ABNORMAL HIGH (ref 1.4–7.0)
Neutrophils: 78 %
Platelets: 486 10*3/uL — ABNORMAL HIGH (ref 150–450)
RBC: 4.12 x10E6/uL — ABNORMAL LOW (ref 4.14–5.80)
RDW: 12.6 % (ref 11.6–15.4)
WBC: 10.4 10*3/uL (ref 3.4–10.8)

## 2022-09-25 ENCOUNTER — Encounter: Payer: Self-pay | Admitting: Physical Therapy

## 2022-09-25 ENCOUNTER — Ambulatory Visit: Payer: Medicare Other | Attending: Family Medicine | Admitting: Physical Therapy

## 2022-09-25 ENCOUNTER — Other Ambulatory Visit: Payer: Self-pay

## 2022-09-25 ENCOUNTER — Telehealth: Payer: Self-pay | Admitting: Family Medicine

## 2022-09-25 DIAGNOSIS — I639 Cerebral infarction, unspecified: Secondary | ICD-10-CM

## 2022-09-25 DIAGNOSIS — R41841 Cognitive communication deficit: Secondary | ICD-10-CM | POA: Diagnosis not present

## 2022-09-25 DIAGNOSIS — R262 Difficulty in walking, not elsewhere classified: Secondary | ICD-10-CM | POA: Diagnosis not present

## 2022-09-25 DIAGNOSIS — I69319 Unspecified symptoms and signs involving cognitive functions following cerebral infarction: Secondary | ICD-10-CM

## 2022-09-25 NOTE — Therapy (Signed)
OUTPATIENT PHYSICAL THERAPY NEURO EVALUATION   Patient Name: [name] [name] MRN: [id number] DOB:04/06/1941, 82 y.o., male Today's Date: 09/25/2022   PCP: Suzan Slick, MD REFERRING PROVIDER: Suzan Slick, MD  END OF SESSION:  PT End of Session - 09/25/22 1451     Visit Number 1    Number of Visits 1    Authorization Type MCR and AARP    Authorization Time Period 09/25/22 to 09/25/22    Progress Note Due on Visit 10    PT Start Time 1402    PT Stop Time 1441    PT Time Calculation (min) 39 min    Activity Tolerance Patient tolerated treatment well    Behavior During Therapy [facility] for tasks assessed/performed             Past Medical History:  Diagnosis Date   A-fib (HCC) 01/2014   AICD (automatic cardioverter/defibrillator) present    [employer]   Anal fissure 11/10/2019   Arthritis    ASHD (arteriosclerotic heart disease) 2015   s/p CABG   Cataract    s/p left   CHF (congestive heart failure) (HCC)    COPD (chronic obstructive pulmonary disease) (HCC)    by CXR, pt unsure of this   Diverticulosis    GERD (gastroesophageal reflux disease)    Gout 2014   Hyperlipidemia    Hypertension    Hypothyroidism    LBBB (left bundle branch block)    Polycythemia vera (HCC)    Prediabetes 01/2014   Presence of permanent cardiac pacemaker    Sleep apnea    no CPAP   Past Surgical History:  Procedure Laterality Date   CARDIAC DEFIBRILLATOR PLACEMENT  07/2014   CATARACT EXTRACTION W/ INTRAOCULAR LENS IMPLANT Left    COLONOSCOPY  08/26/2022   normal   CORONARY ARTERY BYPASS GRAFT  11/2013   ESOPHAGOGASTRODUODENOSCOPY  08/26/2022   esophageal dilation   lumb laminectomy  4098,1191,4782   x 3   LUMBAR FUSION  2013   NASAL SEPTOPLASTY W/ TURBINOPLASTY Bilateral 01/30/2016   Procedure: NASAL SEPTOPLASTY WITH BILATERAL TURBINATE REDUCTION;  Surgeon: Drema Halon, MD;  Location: [facility] OR;  Service: ENT;  Laterality: Bilateral;   TONSILLECTOMY      UVULECTOMY N/A 01/30/2016   Procedure: UVULECTOMY;  Surgeon: Drema Halon, MD;  Location: [facility] OR;  Service: ENT;  Laterality: N/A;   Patient Active Problem List   Diagnosis Date Noted   Acute CVA (cerebrovascular accident) (HCC) 09/16/2022   Chronic diastolic CHF (congestive heart failure) (HCC) 09/16/2022   Encounter for screening colonoscopy 08/26/2022   Chronic ulcer of left foot with fat layer exposed (HCC) 07/10/2022   Zenker's diverticulum 10/16/2021   Loss of appetite 10/10/2021   Loss of weight 10/10/2021   Generalized abdominal pain 10/10/2021   Thrombosed external hemorrhoid 10/10/2021   Dysphagia 08/01/2021   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   Intolerance of continuous positive airway pressure (CPAP) ventilation 05/02/2021   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   JAK2 V617F mutation 08/27/2020   Rectal pain 11/10/2019   Constipation 11/10/2019   Abnormal glucose 10/12/2019   Memory changes 01/10/2019   Polycythemia vera (HCC) 10/21/2018   Thrombocytosis 12/29/2017   Senile purpura (HCC) 09/01/2017   Emphysema of lung (HCC) 06/01/2017   Tortuous aorta (HCC)- CXR 2018 06/01/2017   Regular astigmatism, right eye 10/16/2016   Combined forms of age-related cataract of right eye 10/16/2016   Trigger ring finger of left hand 08/06/2016  Bilateral carpal tunnel syndrome 08/06/2016   Ischemic cardiomyopathy 06/22/2015   ASHD/CABG x 3V (11/2013) 02/06/2015   Acquired thrombophilia (HCC) 02/06/2015   Cardiac pacemaker/AICD (01/2014) 02/06/2015   Morbid obesity (HCC) 01/03/2015   HTN (hypertension) 01/02/2015   Hyperlipidemia, mixed 01/02/2015   Vitamin D deficiency 01/02/2015   GERD  01/02/2015   BPH (benign prostatic hyperplasia) 01/02/2015   Medication management 01/02/2015   Atrial fibrillation (HCC) 01/02/2015   Hypothyroidism 01/02/2015   OSA and COPD overlap syndrome (HCC) 01/02/2015   Chronic systolic (congestive) heart failure (HCC)  02/17/2014   S/P CABG x 3 02/13/2014   Atherosclerotic heart disease of native coronary artery without angina pectoris 02/08/2014   Gout 02/08/2014   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) 02/06/2014    ONSET DATE: 09/16/22  REFERRING DIAG: I63.9 (ICD-10-CM) - Acute CVA (cerebrovascular accident) (HCC) I69.319 (ICD-10-CM) - Cognitive deficit due to recent stroke  THERAPY DIAG:  Difficulty in walking, not elsewhere classified  Rationale for Evaluation and Treatment: Rehabilitation  SUBJECTIVE:                                                                                                                                                                                             SUBJECTIVE STATEMENT:  Per wife, he has had more confusion since the stroke. Physically he's still doing great. Fire dept had to help get him up from the floor, we tried for half an hour. Attributed everything to his dementia, assumed he was a little confused from this.   Pt accompanied by: significant other  PERTINENT HISTORY: Afib, AICD present, ASHD, CHF, COPD, gout, HLD, HTN, hypothyroidism, hx LBBB, pre-DM, CABG, lumbar fusion  PAIN:  Are you having pain? No 0/10  PRECAUTIONS: ICD/Pacemaker  WEIGHT BEARING RESTRICTIONS: No  FALLS: Has patient fallen in last 6 months? No  LIVING ENVIRONMENT: Lives with: lives with their spouse Lives in: House/apartment Stairs: 2 STE no rails, one level home  Has following equipment at home: Walker - 2 wheeled  PLOF: Independent, Independent with basic ADLs, Independent with gait, and Independent with transfers  PATIENT GOALS: "to fix what's going on in his head after the stroke",   OBJECTIVE:   DIAGNOSTIC FINDINGS:   IMPRESSION: 1. Suspected subocclusive embolus in a right M2 superior division branch vessel corresponding to the infarct on today's earlier head CT. 2. Severe left P1 stenosis. 3. 55% proximal right ICA stenosis. 4. 60%  proximal left ICA stenosis. 5.  Aortic Atherosclerosis (ICD10-I70.0).  FINDINGS: Brain: Continued evolution of the previously described infarct in the right frontal operculum. No new infarct is seen. No acute  intracranial hemorrhage. No hydrocephalus or extra-axial collection. No mass effect or midline shift.   Vascular: No new hyperdense vessel or unexpected calcification.   Skull: No calvarial fracture or suspicious bone lesion. Skull base is unremarkable.   Sinuses/Orbits: Unchanged chronic right sphenoid sinusitis. Orbits are unremarkable.   Other: None.   IMPRESSION: Continued evolution of the previously described infarct in the right frontal operculum. No new infarct or acute intracranial hemorrhage.    COGNITION: Overall cognitive status:  hx of early dementia at baseline          LOWER EXTREMITY MMT:    MMT Right Eval Left Eval  Hip flexion 5 5  Hip extension    Hip abduction 5 5  Hip adduction    Hip internal rotation    Hip external rotation    Knee flexion 4+ 4+  Knee extension 5 5  Ankle dorsiflexion 5 5  Ankle plantarflexion    Ankle inversion    Ankle eversion    (Blank rows = not tested)  BED MOBILITY:  Sit to supine Complete Independence Supine to sit Complete Independence Rolling to Right Complete Independence Rolling to Left Complete Independence  TRANSFERS: Assistive device utilized: None  Sit to stand: SBA Stand to sit: SBA    STAIRS: Level of Assistance: Modified independence Stair Negotiation Technique: Step to Pattern with Single Rail on Left Number of Stairs: 5  Height of Stairs:  3 four inch steps, 2 six inch steps   Comments: steady with U UE support, no problems with ascent/descent   GAIT: Gait pattern: step through pattern, decreased arm swing- Right, decreased arm swing- Left, decreased stance time- Right, decreased stance time- Left, trendelenburg, decreased trunk rotation, trunk flexed, and wide BOS Distance  walked: 521ft Assistive device utilized: None Level of assistance: SBA Comments: seems to fatigue easily, drifts R/L slightly as fatigue increases   FUNCTIONAL TESTS:  3 minute walk test: 572ft no device  Dynamic Gait Index: 22/24   TODAY'S TREATMENT:                                                                                                                              DATE:   Eval- objective measures, exam findings, no need for skilled PT services at this point     PATIENT EDUCATION: Education details: exam findings, POC- no need for skilled PT services, OK to do steps with U railing Person educated: Patient and Spouse Education method: Explanation Education comprehension: verbalized understanding  HOME EXERCISE PROGRAM: None   GOALS: Goals reviewed with patient? Yes  SHORT TERM GOALS: Target date: 10/16/2022    Will have completed skilled PT evaluation with satisfactory scores on all objective testing/will have demonstrated safe functional mobility  Baseline: Goal status: MET   LONG TERM GOALS: Target date: no LTGs appropriate     ASSESSMENT:  CLINICAL IMPRESSION: Patient is a 82 y.o. M who was seen today for physical therapy evaluation and treatment for  skilled PT care following acute CVA in late April. He did really well on all objective measures and per spouse seems to be at his baseline level of function- no need for skilled PT services at this point. Would potentially benefit from skilled speech therapy assessment given location of CVA and increase in confusion after.    OBJECTIVE IMPAIRMENTS: decreased mobility.   ACTIVITY LIMITATIONS: standing and locomotion level  PARTICIPATION LIMITATIONS: driving, shopping, community activity, and yard work  PERSONAL FACTORS: Age, Fitness, and Time since onset of injury/illness/exacerbation are also affecting patient's functional outcome.   REHAB POTENTIAL: Excellent  CLINICAL DECISION MAKING:  Stable/uncomplicated  EVALUATION COMPLEXITY: Low  PLAN:  PT FREQUENCY:  eval only   PT DURATION: other: eval only   PLANNED INTERVENTIONS: Therapeutic exercises, Therapeutic activity, Neuromuscular re-education, Balance training, Gait training, Patient/Family education, and Self Care  PLAN FOR NEXT SESSION: none-eval only   Nedra Hai PT DPT PN2

## 2022-09-25 NOTE — Telephone Encounter (Signed)
Dr. Wyline Mood,  I sent the referral to Outpatient rehab-[facility] per your directions. Patient was scheduled for an appointment today 09/25/2022 at 2:00 pm with Nedra Hai to be evaluated.    Contacted patients wife (Ms. [name] [name]) and she confirmed that the appointment went well today. The therapists recommended speech therapy and she would like a referral. Please advise.

## 2022-09-25 NOTE — Telephone Encounter (Signed)
Patient's spouse calling in to request a referral for speech therapy sent to [facility] on [address] in [place]. Please advise. Katha Hamming

## 2022-09-25 NOTE — Telephone Encounter (Signed)
I referred pt to [facility] which does physical, occupational, and speech therapy.  Did she give a reason for this referral to another rehab facility for speech therapy? I'm confused. Please clarify with pt's wife as it looks like they had initial PT evaluation.

## 2022-09-26 NOTE — Telephone Encounter (Signed)
Yes, I'm pretty sure that there is a speech therapist there. Ms. Brownson mentioned that the therapists recommended [facility].  I called [facility] and spoke Darl Pikes, she confirmed that there is not a Human resources officer at any of their offices. Can you place a referral for speech therapy and I will send it to the [facility] office? Please advise.

## 2022-09-29 ENCOUNTER — Telehealth: Payer: Self-pay | Admitting: Family Medicine

## 2022-09-29 NOTE — Telephone Encounter (Signed)
Referral and clinical notes have been faxed to [facility] through [facility]. Office will contact patient to schedule appointment referral.

## 2022-09-29 NOTE — Addendum Note (Signed)
Addended by: Suzan Slick on: 09/29/2022 09:15 AM   Modules accepted: Orders

## 2022-09-29 NOTE — Telephone Encounter (Signed)
Patient's spouse called with some questions about patient's medications. Patient's spouse requested call back at her number on file if possible. Katha Hamming

## 2022-09-29 NOTE — Telephone Encounter (Signed)
Spoke with patients wife regarding [name] [name]'s Medications. She states that she would like to know if it was necessary for him to be on the Eztimibe 10mg  as well as the Rosuvastatin 40mg  She states that she looked up the side effects to the Rosuvastatin and she states that it says it can have some effects to memory , she wants to know if the medication could have been the start of the situation or was it just because it was already in his family.

## 2022-09-29 NOTE — Telephone Encounter (Signed)
Placed referral for speech therapy

## 2022-09-30 NOTE — Telephone Encounter (Signed)
Pt was initially placed on the Crestor and Ezetimibe due to elevated cholesterol and triglycerides; both leading to an increased risk of strokes and/or heart attacks. Also with his heart disease, hx of bypass surgery, and A fib. The dementia is likely not related and studies have also shown that statins may play a part of decreasing the risk of dementia. Vascular dementia is a common cause of dementia in older adults such as patients with pre-existing heart disease such as [name] [name]. His recent LDL was not at goal. With his recent stroke, I feel he should stay on both; in order to prevent further events from happening. She can also discuss with pt's cardiologist and neurologist for another opinion; if she would like.

## 2022-10-01 ENCOUNTER — Telehealth: Payer: Self-pay

## 2022-10-01 ENCOUNTER — Telehealth: Payer: Self-pay | Admitting: Family Medicine

## 2022-10-01 NOTE — Therapy (Unsigned)
OUTPATIENT SPEECH LANGUAGE PATHOLOGY EVALUATION   Patient Name: [name] [name] MRN: [id number] DOB:[birth year], 82 y.o., male Today's Date: 10/02/2022  PCP: Suzan Slick, MD  REFERRING PROVIDER: Suzan Slick, MD   END OF SESSION:  End of Session - 10/02/22 1412     Visit Number 1    Number of Visits 17    Date for SLP Re-Evaluation 11/27/22    Authorization Type Medicare    SLP Start Time 1414   arrived late   SLP Stop Time  1455    SLP Time Calculation (min) 41 min    Activity Tolerance Patient tolerated treatment well             Past Medical History:  Diagnosis Date   A-fib (HCC) 01/2014   AICD (automatic cardioverter/defibrillator) present    [employer]   Anal fissure 11/10/2019   Arthritis    ASHD (arteriosclerotic heart disease) 2015   s/p CABG   Cataract    s/p left   CHF (congestive heart failure) (HCC)    COPD (chronic obstructive pulmonary disease) (HCC)    by CXR, pt unsure of this   Diverticulosis    GERD (gastroesophageal reflux disease)    Gout 2014   Hyperlipidemia    Hypertension    Hypothyroidism    LBBB (left bundle branch block)    Polycythemia vera (HCC)    Prediabetes 01/2014   Presence of permanent cardiac pacemaker    Sleep apnea    no CPAP   Past Surgical History:  Procedure Laterality Date   CARDIAC DEFIBRILLATOR PLACEMENT  07/2014   CATARACT EXTRACTION W/ INTRAOCULAR LENS IMPLANT Left    COLONOSCOPY  08/26/2022   normal   CORONARY ARTERY BYPASS GRAFT  11/2013   ESOPHAGOGASTRODUODENOSCOPY  08/26/2022   esophageal dilation   lumb laminectomy  7829,5621,3086   x 3   LUMBAR FUSION  2013   NASAL SEPTOPLASTY W/ TURBINOPLASTY Bilateral 01/30/2016   Procedure: NASAL SEPTOPLASTY WITH BILATERAL TURBINATE REDUCTION;  Surgeon: Drema Halon, MD;  Location: [facility] OR;  Service: ENT;  Laterality: Bilateral;   TONSILLECTOMY     UVULECTOMY N/A 01/30/2016   Procedure: UVULECTOMY;  Surgeon: Drema Halon, MD;   Location: [facility] OR;  Service: ENT;  Laterality: N/A;   Patient Active Problem List   Diagnosis Date Noted   Acute CVA (cerebrovascular accident) (HCC) 09/16/2022   Chronic diastolic CHF (congestive heart failure) (HCC) 09/16/2022   Encounter for screening colonoscopy 08/26/2022   Chronic ulcer of left foot with fat layer exposed (HCC) 07/10/2022   Zenker's diverticulum 10/16/2021   Loss of appetite 10/10/2021   Loss of weight 10/10/2021   Generalized abdominal pain 10/10/2021   Thrombosed external hemorrhoid 10/10/2021   Dysphagia 08/01/2021   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   Intolerance of continuous positive airway pressure (CPAP) ventilation 05/02/2021   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   JAK2 V617F mutation 08/27/2020   Rectal pain 11/10/2019   Constipation 11/10/2019   Abnormal glucose 10/12/2019   Memory changes 01/10/2019   Polycythemia vera (HCC) 10/21/2018   Thrombocytosis 12/29/2017   Senile purpura (HCC) 09/01/2017   Emphysema of lung (HCC) 06/01/2017   Tortuous aorta (HCC)- CXR 2018 06/01/2017   Regular astigmatism, right eye 10/16/2016   Combined forms of age-related cataract of right eye 10/16/2016   Trigger ring finger of left hand 08/06/2016   Bilateral carpal tunnel syndrome 08/06/2016   Ischemic cardiomyopathy 06/22/2015   ASHD/CABG x 3V (11/2013) 02/06/2015  Acquired thrombophilia (HCC) 02/06/2015   Cardiac pacemaker/AICD (01/2014) 02/06/2015   Morbid obesity (HCC) 01/03/2015   HTN (hypertension) 01/02/2015   Hyperlipidemia, mixed 01/02/2015   Vitamin D deficiency 01/02/2015   GERD  01/02/2015   BPH (benign prostatic hyperplasia) 01/02/2015   Medication management 01/02/2015   Atrial fibrillation (HCC) 01/02/2015   Hypothyroidism 01/02/2015   OSA and COPD overlap syndrome (HCC) 01/02/2015   Chronic systolic (congestive) heart failure (HCC) 02/17/2014   S/P CABG x 3 02/13/2014   Atherosclerotic heart disease of native coronary  artery without angina pectoris 02/08/2014   Gout 02/08/2014   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) 02/06/2014    ONSET DATE: 09/16/22   REFERRING DIAG:  I63.9 (ICD-10-CM) - Acute CVA (cerebrovascular accident) (HCC)  I69.319 (ICD-10-CM) - Cognitive deficit due to recent stroke   THERAPY DIAG: Cognitive communication deficit  Rationale for Evaluation and Treatment: Habilitation  SUBJECTIVE:   SUBJECTIVE STATEMENT: "I don't know" re: recent events/rationale for ST evaluation Pt accompanied by: significant other [name] [name])  PERTINENT HISTORY: Pt is an 82 y.o. male who presented 09/16/22 with AMS. CT showed potentially acute or subacute infarct in the right frontal lobe. PMH: mild cognitive impairment, ischemic cardiomyopathy and coronary artery disease s/p CABG x 3 (2015), pulmonary hypertension, CHF, left bundle branch block, prediabetes, HLD, HTN, mixed central and OSA intolerant of CPAP, COPD, hypothyroidism, lumbar disc disease s/p fusion, polycythemia vera (JAK2 positive on hydroxyurea). MD notes mentioned dementia    PAIN: Are you having pain? No  FALLS: Has patient fallen in last 6 months?  No, See PT evaluation for details  LIVING ENVIRONMENT: Lives with: lives with their spouse Lives in: House/apartment  PLOF:  Level of assistance: Independent with ADLs, Needed assistance with IADLS (minimal) Employment: Retired  PATIENT GOALS: improve memory  OBJECTIVE:   DIAGNOSTIC FINDINGS: 09/17/22 CT HEAD IMPRESSION: Continued evolution of the previously described infarct in the right frontal operculum. No new infarct or acute intracranial hemorrhage.  COGNITION: Overall cognitive status: Impaired Areas of impairment:  Attention: Impaired: Focused, Alternating, Divided Memory: Impaired: Immediate Working Short term Long term Awareness: Impaired: Intellectual Executive function: Impaired: Slow processing Functional deficits: Pt unable to actively participate in  motivational interviewing and identification of recent changes d/t severity of recall deficits. Wife provided extensive hx of cognitive changes, including pt inability to work coffee makers x2, reduced awareness of wrong coffee type, inability to operate television remote, washing hair x3 in succession, unable to name family members, and poor time awareness   COGNITIVE COMMUNICATION: Following directions: Follows one step commands with increased time  Auditory comprehension: Impaired: suspected impaired due to severity of memory and attention deficits Verbal expression: Impaired: suspected impaired due to severity of memory and attention deficits Functional communication: Impaired: Limited verbal output (perseveration, vague speech)  ORAL MOTOR EXAMINATION: Overall status: WFL  STANDARDIZED ASSESSMENTS: Not completed d/t time constraints  PATIENT REPORTED OUTCOME MEASURES (PROM): Not completed d/t time constraints   TODAY'S TREATMENT:  10/02/22: Educated wife on typical exacerbation of dementia cognitive deficits secondary to stroke. Educated pt and wife on plan to address functional goals, such as making coffee, working remote, recalling family names, etc to optimize life participation and reduce caregiver burden. Both verbalized understanding and agreement.    PATIENT EDUCATION: Education details: see above  Person educated: Patient and Spouse Education method: Medical illustrator Education comprehension: verbalized understanding, returned demonstration, and needs further education   GOALS: Goals reviewed with patient? Yes  SHORT TERM GOALS: Target date: 10/30/2022  Pt will sequence steps to operate coffee maker given external supports and spaced retrieval for 2/2 opportunities with occasional max A  Baseline: Goal status: INITIAL  2.   Pt will operate simple functions of television remote (power, volume, channel) with cognitive supports for 2/2 opportunities with occasional max A  Baseline:  Goal status: INITIAL  3.  Pt will effectively use memory aid to name pertinent family members for 2/2 opportunities given occasional max A Baseline:  Goal status: INITIAL  4.  Wife will carryover trained techniques to maximize patient's participation in conversation and home tasks given occasional max A  Baseline:  Goal status: INITIAL   LONG TERM GOALS: Target date: 11/27/2022  Pt will ID personal information in memory book (address, family members, medical providers) with occasional mod A from wife > 1 week Baseline:  Goal status: INITIAL  2.  Pt will be able to carryover targeted functional routines (making coffee, operating remote) with occasional mod A from wife > 1 week Baseline:  Goal status: INITIAL  3.  Wife will appropriately ID and implement necessary memory supports at home to aid cognitive functioning for 2/2 opportunities  Baseline:  Goal status: INITIAL   ASSESSMENT:  CLINICAL IMPRESSION: Patient is a 82 y.o. M who was seen today for cognitive changes s/p recent CVA. PMHX significant for early onset dementia. Today, his wife, [name] [name], served as sole historian given severity of pt's impaired recall and limited intellectual awareness of deficits impacting his participation in motivational interviewing. Prior to stroke, pt was making his own coffee, managing his medications with personalized system and occasional cues from wife, and attending MD appointments alone. Some reduced facial recognition of family members indicated prior to stroke. Since stroke, pt experiencing increased significant difficulty operating coffee makers x2 (pods and pot), using television remote, recalling family members names, and recalling completion of tasks (I.e., washing hair multiple times). Pt and wife would benefit from skilled ST  intervention to optimize understanding of cognitive challenges related to dementia and stroke and to carryover learned techniques to aid patient life participation.   OBJECTIVE IMPAIRMENTS: include attention, memory, awareness, and executive functioning. These impairments are limiting patient from managing medications, household responsibilities, and ADLs/IADLs. Factors affecting potential to achieve goals and functional outcome are ability to learn/carryover information, co-morbidities, previous level of function, and severity of impairments.. Patient will benefit from skilled SLP services to address above impairments and improve overall function.  REHAB POTENTIAL: Good  PLAN:  SLP FREQUENCY: 2x/week  SLP DURATION: 8 weeks  PLANNED INTERVENTIONS: Environmental controls, Cueing hierachy, Cognitive reorganization, Internal/external aids, Functional tasks, SLP instruction and feedback, Compensatory strategies, and Patient/family education    Gracy Racer, CCC-SLP 10/02/2022, 3:02 PM

## 2022-10-01 NOTE — Telephone Encounter (Signed)
Patient's spouse called stating she was returning a call from our office. Notified CMA and advised patient that CMA would call back in a few minutes when out of patient's room. Katha Hamming

## 2022-10-01 NOTE — Telephone Encounter (Signed)
LVM for patients wife [name]), regarding some questions she had about certain medications . Will try again.

## 2022-10-02 ENCOUNTER — Ambulatory Visit: Payer: Medicare Other

## 2022-10-02 ENCOUNTER — Telehealth: Payer: Self-pay | Admitting: Family Medicine

## 2022-10-02 DIAGNOSIS — R41841 Cognitive communication deficit: Secondary | ICD-10-CM | POA: Diagnosis not present

## 2022-10-02 DIAGNOSIS — R262 Difficulty in walking, not elsewhere classified: Secondary | ICD-10-CM | POA: Diagnosis not present

## 2022-10-02 NOTE — Telephone Encounter (Signed)
Pt's wife called this morning, in regards to a call about instructions for medication. She stated that no meds have been given until she hears back from CMA.

## 2022-10-02 NOTE — Telephone Encounter (Signed)
Pt's wife called back. Had concerns on Crestor and Ezetimibe causing dementia and worsening symptoms. Discussed with her that he likely has vascular dementia that was already present before his recent stroke and hx of Afib, CAD, and bypass surgery. To take him off of these would be risky for another CV event. Pt's wife voiced understanding. I have also advised her to discuss concerns with Neurologist regarding these medicines. She was appreciative of my call and will continue these medicines until they see the neurologist on June 5

## 2022-10-02 NOTE — Telephone Encounter (Signed)
See previous TE's for documentation

## 2022-10-06 ENCOUNTER — Ambulatory Visit: Payer: Medicare Other

## 2022-10-06 DIAGNOSIS — R41841 Cognitive communication deficit: Secondary | ICD-10-CM

## 2022-10-06 DIAGNOSIS — R262 Difficulty in walking, not elsewhere classified: Secondary | ICD-10-CM | POA: Diagnosis not present

## 2022-10-06 NOTE — Therapy (Signed)
OUTPATIENT SPEECH LANGUAGE PATHOLOGY TREATMENT   Patient Name: [name] [name] MRN: [id number] DOB:06-12-1940, 82 y.o., male Today's Date: 10/06/2022  PCP: Suzan Slick, MD  REFERRING PROVIDER: Suzan Slick, MD   END OF SESSION:  End of Session - 10/06/22 1307     Visit Number 2    Number of Visits 17    Date for SLP Re-Evaluation 11/27/22    Authorization Type Medicare    SLP Start Time 1024    SLP Stop Time  1104    SLP Time Calculation (min) 40 min    Activity Tolerance Patient tolerated treatment well              Past Medical History:  Diagnosis Date   A-fib (HCC) 01/2014   AICD (automatic cardioverter/defibrillator) present    [employer]   Anal fissure 11/10/2019   Arthritis    ASHD (arteriosclerotic heart disease) 2015   s/p CABG   Cataract    s/p left   CHF (congestive heart failure) (HCC)    COPD (chronic obstructive pulmonary disease) (HCC)    by CXR, pt unsure of this   Diverticulosis    GERD (gastroesophageal reflux disease)    Gout 2014   Hyperlipidemia    Hypertension    Hypothyroidism    LBBB (left bundle branch block)    Polycythemia vera (HCC)    Prediabetes 01/2014   Presence of permanent cardiac pacemaker    Sleep apnea    no CPAP   Past Surgical History:  Procedure Laterality Date   CARDIAC DEFIBRILLATOR PLACEMENT  07/2014   CATARACT EXTRACTION W/ INTRAOCULAR LENS IMPLANT Left    COLONOSCOPY  08/26/2022   normal   CORONARY ARTERY BYPASS GRAFT  11/2013   ESOPHAGOGASTRODUODENOSCOPY  08/26/2022   esophageal dilation   lumb laminectomy  4098,1191,4782   x 3   LUMBAR FUSION  2013   NASAL SEPTOPLASTY W/ TURBINOPLASTY Bilateral 01/30/2016   Procedure: NASAL SEPTOPLASTY WITH BILATERAL TURBINATE REDUCTION;  Surgeon: Drema Halon, MD;  Location: [facility] OR;  Service: ENT;  Laterality: Bilateral;   TONSILLECTOMY     UVULECTOMY N/A 01/30/2016   Procedure: UVULECTOMY;  Surgeon: Drema Halon, MD;  Location: [facility]  OR;  Service: ENT;  Laterality: N/A;   Patient Active Problem List   Diagnosis Date Noted   Acute CVA (cerebrovascular accident) (HCC) 09/16/2022   Chronic diastolic CHF (congestive heart failure) (HCC) 09/16/2022   Encounter for screening colonoscopy 08/26/2022   Chronic ulcer of left foot with fat layer exposed (HCC) 07/10/2022   Zenker's diverticulum 10/16/2021   Loss of appetite 10/10/2021   Loss of weight 10/10/2021   Generalized abdominal pain 10/10/2021   Thrombosed external hemorrhoid 10/10/2021   Dysphagia 08/01/2021   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   Intolerance of continuous positive airway pressure (CPAP) ventilation 05/02/2021   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   JAK2 V617F mutation 08/27/2020   Rectal pain 11/10/2019   Constipation 11/10/2019   Abnormal glucose 10/12/2019   Memory changes 01/10/2019   Polycythemia vera (HCC) 10/21/2018   Thrombocytosis 12/29/2017   Senile purpura (HCC) 09/01/2017   Emphysema of lung (HCC) 06/01/2017   Tortuous aorta (HCC)- CXR 2018 06/01/2017   Regular astigmatism, right eye 10/16/2016   Combined forms of age-related cataract of right eye 10/16/2016   Trigger ring finger of left hand 08/06/2016   Bilateral carpal tunnel syndrome 08/06/2016   Ischemic cardiomyopathy 06/22/2015   ASHD/CABG x 3V (11/2013) 02/06/2015   Acquired  thrombophilia (HCC) 02/06/2015   Cardiac pacemaker/AICD (01/2014) 02/06/2015   Morbid obesity (HCC) 01/03/2015   HTN (hypertension) 01/02/2015   Hyperlipidemia, mixed 01/02/2015   Vitamin D deficiency 01/02/2015   GERD  01/02/2015   BPH (benign prostatic hyperplasia) 01/02/2015   Medication management 01/02/2015   Atrial fibrillation (HCC) 01/02/2015   Hypothyroidism 01/02/2015   OSA and COPD overlap syndrome (HCC) 01/02/2015   Chronic systolic (congestive) heart failure (HCC) 02/17/2014   S/P CABG x 3 02/13/2014   Atherosclerotic heart disease of native coronary artery without  angina pectoris 02/08/2014   Gout 02/08/2014   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) 02/06/2014    ONSET DATE: 09/16/22   REFERRING DIAG:  I63.9 (ICD-10-CM) - Acute CVA (cerebrovascular accident) (HCC)  I69.319 (ICD-10-CM) - Cognitive deficit due to recent stroke   THERAPY DIAG: Cognitive communication deficit  Rationale for Evaluation and Treatment: Rehabilitation  SUBJECTIVE:   SUBJECTIVE STATEMENT: "The TV doesn't matter all that much."      "He only does the shower routine twice a week." Pt accompanied by: significant other [name])  PERTINENT HISTORY: Pt is an 82 y.o. male who presented 09/16/22 with AMS. CT showed potentially acute or subacute infarct in the right frontal lobe. PMH: mild cognitive impairment, ischemic cardiomyopathy and coronary artery disease s/p CABG x 3 (2015), pulmonary hypertension, CHF, left bundle branch block, prediabetes, HLD, HTN, mixed central and OSA intolerant of CPAP, COPD, hypothyroidism, lumbar disc disease s/p fusion, polycythemia vera (JAK2 positive on hydroxyurea). MD notes mentioned dementia    PAIN: Are you having pain? No  FALLS: Has patient fallen in last 6 months?  No, See PT evaluation for details  PATIENT GOALS: improve memory  OBJECTIVE:   COGNITION: Overall cognitive status: Impaired Areas of impairment:  Attention: Impaired: Focused, Alternating, Divided Memory: Impaired: Immediate Working Short term Long term Awareness: Impaired: Intellectual Executive function: Impaired: Slow processing Functional deficits: Pt unable to actively participate in motivational interviewing and identification of recent changes d/t severity of recall deficits. Wife provided extensive hx of cognitive changes, including pt inability to work coffee makers x2, reduced awareness of wrong coffee type, inability to operate television remote, washing hair x3 in succession, unable to name family members, and poor time awareness   COGNITIVE  COMMUNICATION: Following directions: Follows one step commands with increased time  Auditory comprehension: Impaired: suspected impaired due to severity of memory and attention deficits Verbal expression: Impaired: suspected impaired due to severity of memory and attention deficits Functional communication: Impaired: Limited verbal output (perseveration, vague speech)  PATIENT REPORTED OUTCOME MEASURES (PROM): Not completed d/t time constraints   TODAY'S TREATMENT:                                                                                                                                         10/06/22: Pt wife [name] stated that pt does not need to learn  how to use the remote, and only showers twice a week. She had a long explanation why she would like him to learn how to pay bills again.  Although notes from 10/02/22 state wife was educated about exacerbation of dementia in cognitive communication post CVA, SLP reiterated this to wife today again for sake of clarity. SLP also explained/educated pt and wife that ST will work with pt and wife to have pt regain as much function as possible after CVA but that dx of dementia decreases clarity of pt's overall prognosis in ST. Today, pt answered "I don't remember" of "I don't recall" to 7/8 questions asked of him today and looked to wife to ansewr for him. Wife told SLP she found pt standing wet out of shower over the weekend with towel on floor and had to cue pt to dry off and begin after shower routine. SLP assisted wife/pt making a checklist and encouraged wife to place checklist in bathroom. SLP educated wife she may need to assist pt with routine for the first 3-5 times to foster recall of routine. Lastly, wife very concerned about bill paying and pt's ability to regain that skill. SLP told wife to sit with pt to see what is recalled from rote memory. SLP will assist in augmenting with written cues and teach wife how to cue pt for this task next  session.  SLP questions pt's need for formal cognitive testing (CLQT) due to severity of deficits.  Pt may benefit more at a later date.   10/02/22: Educated wife on typical exacerbation of dementia cognitive deficits secondary to stroke. Educated pt and wife on plan to address functional goals, such as making coffee, working remote, recalling family names, etc to optimize life participation and reduce caregiver burden. Both verbalized understanding and agreement.   PATIENT EDUCATION: Education details: see above  Person educated: Patient and Spouse Education method: Medical illustrator Education comprehension: verbalized understanding, returned demonstration, and needs further education   GOALS: Goals reviewed with patient? Yes  SHORT TERM GOALS: Target date: 10/30/2022  Pt will sequence steps to operate coffee maker given external supports and spaced retrieval for 2/2 opportunities with occasional max A  Baseline: Goal status: IN PROGRESS  2.  Pt will operate simple functions of television remote (power, volume, channel) with cognitive supports for 2/2 opportunities with occasional max A  Baseline:  Goal status: IN PROGRESS  3.  Pt will effectively use memory aid to name pertinent family members for 2/2 opportunities given occasional max A Baseline:  Goal status: IN PROGRESS  4.  Wife will carryover trained techniques to maximize patient's participation in conversation and home tasks given occasional max A  Baseline:  Goal status: IN PROGRESS   LONG TERM GOALS: Target date: 11/27/2022  Pt will ID personal information in memory book (address, family members, medical providers) with occasional mod A from wife > 1 week Baseline:  Goal status: IN PROGRESS  2.  Pt will be able to carryover targeted functional routines (making coffee, operating remote) with occasional mod A from wife > 1 week Baseline:  Goal status: IN PROGRESS  3.  Wife will appropriately ID and  implement necessary memory supports at home to aid cognitive functioning for 2/2 opportunities  Baseline:  Goal status: IN PROGRESS   ASSESSMENT:  CLINICAL IMPRESSION: Patient is a 82 y.o. M who was seen today for cognitive changes s/p recent CVA. PMHX significant for early onset dementia. [name],cont'd to serve as sole historian given severity of  pt's impaired recall and limited intellectual awareness of deficits impacting his participation in motivational interviewing. Prior to stroke, pt was making his own coffee, managing his medications with personalized system and occasional cues from wife, and attending MD appointments alone. Some reduced facial recognition of family members indicated prior to stroke. Since stroke, pt experiencing increased significant difficulty operating coffee makers x2 (pods and pot), using television remote, recalling family members names, and recalling completion of tasks (I.e., washing hair multiple times). Pt and wife would benefit from skilled ST intervention to optimize understanding of cognitive challenges related to dementia and stroke and to carryover learned techniques to aid patient life participation.   OBJECTIVE IMPAIRMENTS: include attention, memory, awareness, and executive functioning. These impairments are limiting patient from managing medications, household responsibilities, and ADLs/IADLs. Factors affecting potential to achieve goals and functional outcome are ability to learn/carryover information, co-morbidities, previous level of function, and severity of impairments.. Patient will benefit from skilled SLP services to address above impairments and improve overall function.  REHAB POTENTIAL: Good  PLAN:  SLP FREQUENCY: 2x/week  SLP DURATION: 8 weeks  PLANNED INTERVENTIONS: Environmental controls, Cueing hierachy, Cognitive reorganization, Internal/external aids, Functional tasks, SLP instruction and feedback, Compensatory strategies, and  Patient/family education    [facility], CCC-SLP 10/06/2022, 1:07 PM

## 2022-10-08 ENCOUNTER — Ambulatory Visit: Payer: Medicare Other

## 2022-10-08 DIAGNOSIS — R41841 Cognitive communication deficit: Secondary | ICD-10-CM

## 2022-10-08 DIAGNOSIS — R262 Difficulty in walking, not elsewhere classified: Secondary | ICD-10-CM | POA: Diagnosis not present

## 2022-10-08 NOTE — Patient Instructions (Addendum)
  Look at the appointments for tomorrow in the afternoon or evening and plan out the next day - write appointments and the rest of the schedule you plan on the daily schedule sheet.  Review tasks, plans, and appointments for each day in the morning, with the calendar  Put a sticky note on dash to help tom recall of what medical professional you are traveling to see.

## 2022-10-08 NOTE — Therapy (Signed)
OUTPATIENT SPEECH LANGUAGE PATHOLOGY TREATMENT   Patient Name: [name] [name] MRN: [id number] DOB:May 11, 1941, 82 y.o., male Today's Date: 10/08/2022  PCP: Suzan Slick, MD  REFERRING PROVIDER: Suzan Slick, MD   END OF SESSION:  End of Session - 10/08/22 1157     Visit Number 3    Number of Visits 17    Date for SLP Re-Evaluation 11/27/22    Authorization Type Medicare    SLP Start Time 1110   checked in 1108   SLP Stop Time  1150    SLP Time Calculation (min) 40 min    Activity Tolerance Patient tolerated treatment well   limited somewhat by pt's attention              Past Medical History:  Diagnosis Date   A-fib (HCC) 01/2014   AICD (automatic cardioverter/defibrillator) present    [employer]   Anal fissure 11/10/2019   Arthritis    ASHD (arteriosclerotic heart disease) 2015   s/p CABG   Cataract    s/p left   CHF (congestive heart failure) (HCC)    COPD (chronic obstructive pulmonary disease) (HCC)    by CXR, pt unsure of this   Diverticulosis    GERD (gastroesophageal reflux disease)    Gout 2014   Hyperlipidemia    Hypertension    Hypothyroidism    LBBB (left bundle branch block)    Polycythemia vera (HCC)    Prediabetes 01/2014   Presence of permanent cardiac pacemaker    Sleep apnea    no CPAP   Past Surgical History:  Procedure Laterality Date   CARDIAC DEFIBRILLATOR PLACEMENT  07/2014   CATARACT EXTRACTION W/ INTRAOCULAR LENS IMPLANT Left    COLONOSCOPY  08/26/2022   normal   CORONARY ARTERY BYPASS GRAFT  11/2013   ESOPHAGOGASTRODUODENOSCOPY  08/26/2022   esophageal dilation   lumb laminectomy  4098,1191,4782   x 3   LUMBAR FUSION  2013   NASAL SEPTOPLASTY W/ TURBINOPLASTY Bilateral 01/30/2016   Procedure: NASAL SEPTOPLASTY WITH BILATERAL TURBINATE REDUCTION;  Surgeon: Drema Halon, MD;  Location: [facility] OR;  Service: ENT;  Laterality: Bilateral;   TONSILLECTOMY     UVULECTOMY N/A 01/30/2016   Procedure:  UVULECTOMY;  Surgeon: Drema Halon, MD;  Location: [facility] OR;  Service: ENT;  Laterality: N/A;   Patient Active Problem List   Diagnosis Date Noted   Acute CVA (cerebrovascular accident) (HCC) 09/16/2022   Chronic diastolic CHF (congestive heart failure) (HCC) 09/16/2022   Encounter for screening colonoscopy 08/26/2022   Chronic ulcer of left foot with fat layer exposed (HCC) 07/10/2022   Zenker's diverticulum 10/16/2021   Loss of appetite 10/10/2021   Loss of weight 10/10/2021   Generalized abdominal pain 10/10/2021   Thrombosed external hemorrhoid 10/10/2021   Dysphagia 08/01/2021   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   Intolerance of continuous positive airway pressure (CPAP) ventilation 05/02/2021   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   JAK2 V617F mutation 08/27/2020   Rectal pain 11/10/2019   Constipation 11/10/2019   Abnormal glucose 10/12/2019   Memory changes 01/10/2019   Polycythemia vera (HCC) 10/21/2018   Thrombocytosis 12/29/2017   Senile purpura (HCC) 09/01/2017   Emphysema of lung (HCC) 06/01/2017   Tortuous aorta (HCC)- CXR 2018 06/01/2017   Regular astigmatism, right eye 10/16/2016   Combined forms of age-related cataract of right eye 10/16/2016   Trigger ring finger of left hand 08/06/2016   Bilateral carpal tunnel syndrome 08/06/2016   Ischemic cardiomyopathy  06/22/2015   ASHD/CABG x 3V (11/2013) 02/06/2015   Acquired thrombophilia (HCC) 02/06/2015   Cardiac pacemaker/AICD (01/2014) 02/06/2015   Morbid obesity (HCC) 01/03/2015   HTN (hypertension) 01/02/2015   Hyperlipidemia, mixed 01/02/2015   Vitamin D deficiency 01/02/2015   GERD  01/02/2015   BPH (benign prostatic hyperplasia) 01/02/2015   Medication management 01/02/2015   Atrial fibrillation (HCC) 01/02/2015   Hypothyroidism 01/02/2015   OSA and COPD overlap syndrome (HCC) 01/02/2015   Chronic systolic (congestive) heart failure (HCC) 02/17/2014   S/P CABG x 3 02/13/2014    Atherosclerotic heart disease of native coronary artery without angina pectoris 02/08/2014   Gout 02/08/2014   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) 02/06/2014    ONSET DATE: 09/16/22   REFERRING DIAG:  I63.9 (ICD-10-CM) - Acute CVA (cerebrovascular accident) (HCC)  I69.319 (ICD-10-CM) - Cognitive deficit due to recent stroke   THERAPY DIAG: Cognitive communication deficit  Rationale for Evaluation and Treatment: Rehabilitation  SUBJECTIVE:   SUBJECTIVE STATEMENT: "I don't remember."  Pt accompanied by: significant other [name])  PERTINENT HISTORY: Pt is an 82 y.o. male who presented 09/16/22 with AMS. CT showed potentially acute or subacute infarct in the right frontal lobe. PMH: mild cognitive impairment, ischemic cardiomyopathy and coronary artery disease s/p CABG x 3 (2015), pulmonary hypertension, CHF, left bundle branch block, prediabetes, HLD, HTN, mixed central and OSA intolerant of CPAP, COPD, hypothyroidism, lumbar disc disease s/p fusion, polycythemia vera (JAK2 positive on hydroxyurea). MD notes mentioned dementia    PAIN: Are you having pain? No  FALLS: Has patient fallen in last 6 months?  No, See PT evaluation for details  PATIENT GOALS: improve memory  OBJECTIVE:   COGNITION: Overall cognitive status: Impaired Areas of impairment:  Attention: Impaired: Focused, Alternating, Divided Memory: Impaired: Immediate Working Short term Long term Awareness: Impaired: Intellectual Executive function: Impaired: Slow processing Functional deficits: Pt unable to actively participate in motivational interviewing and identification of recent changes d/t severity of recall deficits. Wife provided extensive hx of cognitive changes, including pt inability to work coffee makers x2, reduced awareness of wrong coffee type, inability to operate television remote, washing hair x3 in succession, unable to name family members, and poor time awareness   COGNITIVE  COMMUNICATION: Following directions: Follows one step commands with increased time  Auditory comprehension: Impaired: suspected impaired due to severity of memory and attention deficits Verbal expression: Impaired: suspected impaired due to severity of memory and attention deficits Functional communication: Impaired: Limited verbal output (perseveration, vague speech)  PATIENT REPORTED OUTCOME MEASURES (PROM): Not completed d/t time constraints   TODAY'S TREATMENT:                                                                                                                                         10/08/22: Educated pt and wife re: how to improve retrieval of family names. To improve pt memory and  independence with schedule management, suggested to pt and wife to review next day's appointments each day in the afternoon, write into the schedule for the next day, and then plan anything else they want to do tomorrow at that time, and write into the schedule. Pt can have schedule reinforced in the morning with coffee. [name] said pt forgot why they were in the car traveling here despite her telling him shortly before he demonstrated he had forgotten, so SLP suggested sticky note on dash to assist pt in recall of what medical professional they are traveling to see.  [name] shared an errand to the farmers market occurring after this appointment. Patient recalled, given extra time, that they were going to the farmers market, but did not recall why. SLP engaged in spaced retrieval with pt for the reason. After 30 second interval  patient again required cues for the reason going to the farmers market. To foster recall, SLP had pt write down [employer], and "pink flowers" and due to time, SLP wrote on sticky note for [name] to put on dash. SLP told [name], ideally, would have wanted pt to have written on the sticky note and encouraged her to do the same at home. Reiterated the need for routine for memory improvement  via procedural memory.  10/06/22: Pt wife [name] stated that pt does not need to learn how to use the remote, and only showers twice a week. She had a long explanation why she would like him to learn how to pay bills again.  Although notes from 10/02/22 state wife was educated about exacerbation of dementia in cognitive communication post CVA, SLP reiterated this to wife today again for sake of clarity. SLP also explained/educated pt and wife that ST will work with pt and wife to have pt regain as much function as possible after CVA but that dx of dementia decreases clarity of pt's overall prognosis in ST. Today, pt answered "I don't remember" of "I don't recall" to 7/8 questions asked of him today and looked to wife to ansewr for him. Wife told SLP she found pt standing wet out of shower over the weekend with towel on floor and had to cue pt to dry off and begin after shower routine. SLP assisted wife/pt making a checklist and encouraged wife to place checklist in bathroom. SLP educated wife she may need to assist pt with routine for the first 3-5 times to foster recall of routine. Lastly, wife very concerned about bill paying and pt's ability to regain that skill. SLP told wife to sit with pt to see what is recalled from rote memory. SLP will assist in augmenting with written cues and teach wife how to cue pt for this task next session.  SLP questions pt's need for formal cognitive testing (CLQT) due to severity of deficits.  Pt may benefit more at a later date.   10/02/22: Educated wife on typical exacerbation of dementia cognitive deficits secondary to stroke. Educated pt and wife on plan to address functional goals, such as making coffee, working remote, recalling family names, etc to optimize life participation and reduce caregiver burden. Both verbalized understanding and agreement.   PATIENT EDUCATION: Education details: see above  Person educated: Patient and Spouse Education method: Software engineer Education comprehension: verbalized understanding, returned demonstration, and needs further education   GOALS: Goals reviewed with patient? Yes  SHORT TERM GOALS: Target date: 10/30/2022  Pt will sequence steps to operate coffee maker given external supports and spaced  retrieval for 2/2 opportunities with occasional max A  Baseline: Goal status: IN PROGRESS  2.  Pt will operate simple functions of television remote (power, volume, channel) with cognitive supports for 2/2 opportunities with occasional max A  Baseline:  Goal status: IN PROGRESS  3.  Pt will effectively use memory aid to name pertinent family members for 2/2 opportunities given occasional max A Baseline:  Goal status: IN PROGRESS  4.  Wife will carryover trained techniques to maximize patient's participation in conversation and home tasks given occasional max A  Baseline:  Goal status: IN PROGRESS   LONG TERM GOALS: Target date: 11/27/2022  Pt will ID personal information in memory book (address, family members, medical providers) with occasional mod A from wife > 1 week Baseline:  Goal status: IN PROGRESS  2.  Pt will be able to carryover targeted functional routines (making coffee, operating remote) with occasional mod A from wife > 1 week Baseline:  Goal status: IN PROGRESS  3.  Wife will appropriately ID and implement necessary memory supports at home to aid cognitive functioning for 2/2 opportunities  Baseline:  Goal status: IN PROGRESS   ASSESSMENT:  CLINICAL IMPRESSION: Patient is a 82 y.o. M who was seen today for cognitive changes s/p recent CVA. PMHX significant for early onset dementia. Prior to stroke, pt was making his own coffee, managing his medications with personalized system and occasional cues from wife, and attending MD appointments alone. Some reduced facial recognition of family members indicated prior to stroke. Since stroke, pt experiencing increased significant difficulty  operating coffee makers x2 (pods and pot), using television remote, recalling family members names, and recalling completion of tasks (I.e., washing hair multiple times). Pt and wife would benefit from skilled ST intervention to optimize understanding of cognitive challenges related to dementia and stroke and to carryover learned techniques to aid patient life participation.   OBJECTIVE IMPAIRMENTS: include attention, memory, awareness, and executive functioning. These impairments are limiting patient from managing medications, household responsibilities, and ADLs/IADLs. Factors affecting potential to achieve goals and functional outcome are ability to learn/carryover information, co-morbidities, previous level of function, and severity of impairments.. Patient will benefit from skilled SLP services to address above impairments and improve overall function.  REHAB POTENTIAL: Good  PLAN:  SLP FREQUENCY: 2x/week  SLP DURATION: 8 weeks  PLANNED INTERVENTIONS: Environmental controls, Cueing hierachy, Cognitive reorganization, Internal/external aids, Functional tasks, SLP instruction and feedback, Compensatory strategies, and Patient/family education    [facility], CCC-SLP 10/08/2022, 12:07 PM

## 2022-10-09 ENCOUNTER — Ambulatory Visit: Payer: Medicare Other | Admitting: Nurse Practitioner

## 2022-10-21 ENCOUNTER — Ambulatory Visit: Payer: Medicare Other | Attending: Family Medicine

## 2022-10-21 DIAGNOSIS — R41841 Cognitive communication deficit: Secondary | ICD-10-CM | POA: Insufficient documentation

## 2022-10-22 ENCOUNTER — Encounter: Payer: Self-pay | Admitting: Neurology

## 2022-10-22 ENCOUNTER — Telehealth: Payer: Self-pay | Admitting: Neurology

## 2022-10-22 ENCOUNTER — Inpatient Hospital Stay: Payer: Medicare Other | Admitting: Neurology

## 2022-10-22 ENCOUNTER — Ambulatory Visit (INDEPENDENT_AMBULATORY_CARE_PROVIDER_SITE_OTHER): Payer: Medicare Other | Admitting: Neurology

## 2022-10-22 VITALS — BP 107/65 | HR 71 | Ht 67.0 in | Wt 233.6 lb

## 2022-10-22 DIAGNOSIS — F02B0 Dementia in other diseases classified elsewhere, moderate, without behavioral disturbance, psychotic disturbance, mood disturbance, and anxiety: Secondary | ICD-10-CM

## 2022-10-22 DIAGNOSIS — N390 Urinary tract infection, site not specified: Secondary | ICD-10-CM | POA: Diagnosis not present

## 2022-10-22 DIAGNOSIS — E559 Vitamin D deficiency, unspecified: Secondary | ICD-10-CM | POA: Diagnosis not present

## 2022-10-22 DIAGNOSIS — E519 Thiamine deficiency, unspecified: Secondary | ICD-10-CM | POA: Diagnosis not present

## 2022-10-22 DIAGNOSIS — G301 Alzheimer's disease with late onset: Secondary | ICD-10-CM

## 2022-10-22 DIAGNOSIS — I639 Cerebral infarction, unspecified: Secondary | ICD-10-CM

## 2022-10-22 DIAGNOSIS — I255 Ischemic cardiomyopathy: Secondary | ICD-10-CM | POA: Diagnosis not present

## 2022-10-22 DIAGNOSIS — E538 Deficiency of other specified B group vitamins: Secondary | ICD-10-CM

## 2022-10-22 NOTE — Patient Instructions (Addendum)
Eeg Bloodwork CT scan to look for amyloid in the brain  No orders of the defined types were placed in this encounter.  Orders Placed This Encounter  Procedures   Culture, Urine   NM PET Brain Amyloid   B12 and Folate Panel   Methylmalonic acid, serum   Vitamin B1   Vitamin D, 25-hydroxy   Homocysteine   Ammonia   ATN PROFILE   Urinalysis, Routine w reflex microscopic   APOE Alzheimer's Risk   EEG adult     Alzheimer's Disease Alzheimer's disease is a brain disease that affects memory, thinking, language, and behavior. People with Alzheimer's disease lose mental abilities, and the disease gets worse over time. Alzheimer's disease is a form of dementia. What are the causes? This condition develops when a protein called beta-amyloid forms deposits in the brain. It is not known what causes these deposits to form. Alzheimer's disease may also be caused by a gene mutation that is inherited from one parent or both parents. A gene mutation is a harmful change in a gene. Not everyone who inherits the genetic mutation will get the disease. What increases the risk? You are more likely to develop this condition if you: Are older than age 44. Are male. Have any of these medical conditions: High blood pressure. Diabetes. Heart or blood vessel disease. Smoke. Have obesity. Have had a brain injury. Have had a stroke. Have a family history of dementia. What are the signs or symptoms? Symptoms of this condition may happen in three stages, which often overlap. Early stage In this stage, you may continue to be independent. You may still be able to drive, work, and be social. Symptoms in this stage include: Minor memory problems, such as forgetting a name, words, or what you did recently. Difficulty with: Paying attention. Communicating. Doing familiar tasks. Problem solving or doing calculations. Following instructions. Learning new things. Anxiety. Social withdrawal. Loss of  motivation. Moderate stage In this stage, you will start to need care. Symptoms in this stage include: Difficulty with expressing thoughts. Memory loss that affects daily life. This can include forgetting: Recent events that have happened. If you have taken medicines or eaten. Familiar places. You may get lost while walking or driving. To pay bills or manage finances. Personal hygiene such as bathing or using the bathroom. Confusion about where you are or what time it is. Difficulty in judging distance. Changes in personality, mood, and behavior. You may be moody, irritable, angry, frustrated, fearful, anxious, or suspicious. Poor reasoning and judgment. Delusions or hallucinations. Changes in sleep patterns. Severe stage In the final stage, you will need help with your personal care and daily activities. Symptoms in this stage include: Worsening memory loss. Personality changes. Loss of awareness of your surroundings. Changes in physical abilities, including the ability to walk, sit, and swallow. Difficulty in communicating. Inability to control your bladder and bowels. Increasing confusion. Increasing behavior changes. How is this diagnosed?  This condition is diagnosed by a health care provider who specializes in diseases of the nervous system (neurologist) or one who specializes in care of the elderly (geriatrician or geriatric psychiatrist). Other causes of dementia may also be ruled out. Your health care provider will talk with you and your family, friends, or caregivers about your history and symptoms. A thorough medical history will be taken, and you will have a physical exam and tests. Tests may include: Lab tests, such as blood or urine tests. Imaging tests, such as a CT scan, a PET  scan, or an MRI. A lumbar puncture. This test involves removing and testing a small amount of the fluid that surrounds the brain and spinal cord. An electroencephalogram (EEG). In this test,  small metal discs are used to measure electrical activity in the brain. Memory tests, cognitive tests, and neuropsychological tests. These tests evaluate brain function. Genetic testing. This may be done if you have early onset of the disease (before age 35) or if other family members have the disease. How is this treated? At this time, there is no treatment to cure Alzheimer's disease or stop it from getting worse. The goals of treatment are: To manage behavioral changes. To provide you with a safe environment. To help manage daily life for you and your caregivers. The following treatment options are available: Medicines. Medicines may help the memory work better and manage behavioral symptoms. Cognitive therapy. Cognitive therapy provides you with education, support, and memory aids. It is most helpful in the early stages of the condition. Counseling or spiritual guidance. It is normal to have a lot of feelings, including anger, relief, fear, and isolation. Counseling and guidance can help you deal with these feelings. Caregiving. This involves having caregivers help you with your daily activities. Family support groups. These provide education, emotional support, and information about community resources to family members who are taking care of you. Follow these instructions at home:  Medicines Take over-the-counter and prescription medicines only as told by your health care provider. Use a pill organizer or pill reminder to help you manage your medicines. Avoid taking medicines that can affect thinking, such as pain medicines or sleeping medicines. Lifestyle Make healthy lifestyle choices: Be physically active as told by your health care provider. Regular exercise may help improve symptoms. Do not use any products that contain nicotine or tobacco, such as cigarettes, e-cigarettes, and chewing tobacco. If you need help quitting, ask your health care provider. Do not drink alcohol. Eat a  healthy diet. Practice stress-management techniques when you get stressed. Stay social. Drink enough fluid to keep your urine pale yellow. Make sure to get quality sleep. Avoid taking long naps during the day. Take short naps of 30 minutes or less if needed. Keep your sleeping area dark and cool. Avoid exercising during the few hours before you go to bed. Avoid caffeine products in the afternoon and evening. General instructions Work with your health care provider to determine what you need help with and what your safety needs are. If you were given a bracelet that identifies you as a person with memory loss or tracks your location, make sure to wear it at all times. Talk with your health care provider about whether it is safe for you to drive. Work with your family to make important decisions, such as advance directives, medical power of attorney, or a living will. Keep all follow-up visits. This is important. Where to find more information The Alzheimer's Association: Call the 24-hour helpline at 640-775-2831, or visit [employer] Contact a health care provider if: You have nausea, vomiting, or trouble with eating related to a medicine. You have worsening mood or behavior changes, such as depression, anxiety, or hallucinations. You or your family members become concerned for your safety. Get help right away if: You become less responsive or are difficult to wake up. Your memory suddenly gets worse. You feel that you want to harm yourself. If you ever feel like you may hurt yourself or others, or have thoughts about taking your own life, get help  right away. Go to your nearest emergency department or: Call your local emergency services (911 in the [place] Call a suicide crisis helpline, such as the National Suicide Prevention Lifeline at 785-229-5422 or 988 in the [place] This is open 24 hours a day in the [place] Text the Crisis Text Line at 6287062806 (in the [place] Summary Alzheimer's  disease is a brain disease that affects memory, thinking, language, and behavior. Alzheimer's disease is a form of dementia. This condition is diagnosed by a specialist in diseases of the nervous system (neurologist) or one who specializes in care of the elderly. At this time, there is no treatment to cure Alzheimer's disease or stop it from getting worse. The goal of treatment is to help you manage any symptoms. Work with your family to make important decisions, such as advance directives, medical power of attorney, or a living will. This information is not intended to replace advice given to you by your health care provider. Make sure you discuss any questions you have with your health care provider. Document Revised: 11/28/2020 Document Reviewed: 08/22/2019 Elsevier Patient Education  2024 [employer].

## 2022-10-22 NOTE — Telephone Encounter (Signed)
medicare/[employer] NPR sent to [employer] [id number]

## 2022-10-22 NOTE — Progress Notes (Unsigned)
[id number]    Provider:  Dr Lucia Gaskins Requesting Provider: Marvel Plan, MD Primary Care Provider:  Suzan Slick, MD  CC:  stroke, cognitive decline  HPI:  [name] [name] is a 82 y.o. male here as requested by Marvel Plan, MD for stroke. has HTN (hypertension); Hyperlipidemia, mixed; Vitamin D deficiency; GERD ; BPH (benign prostatic hyperplasia); Medication management; Atrial fibrillation (HCC); Hypothyroidism; OSA and COPD overlap syndrome (HCC); Morbid obesity (HCC); ASHD/CABG x 3V (11/2013); Acquired thrombophilia (HCC); Cardiac pacemaker/AICD (01/2014); Emphysema of lung (HCC); Tortuous aorta (HCC)- CXR 2018; Senile purpura (HCC); Thrombocytosis; Polycythemia vera (HCC); Memory changes; Abnormal glucose; Rectal pain; Constipation; MPN (myeloproliferative neoplasm) (HCC); JAK2 V617F mutation; Complex sleep apnea syndrome; Intolerance of continuous positive airway pressure (CPAP) ventilation; Dysphagia; Loss of appetite; Loss of weight; Generalized abdominal pain; Thrombosed external hemorrhoid; Zenker's diverticulum; Atherosclerotic heart disease of native coronary artery without angina pectoris; Trigger ring finger of left hand; S/P CABG x 3; Regular astigmatism, right eye; 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  Wife provides most information.  She states that patient has been diagnosed with dementia and recently had a stroke.  She has a long list of questions.  She was referred for stroke follow-up but is adamant she wants to discuss both stroke and memory evaluation.  I  explained it is very difficult to do in 1 appointment so this was an extended visit.  When results come back I will have to schedule her for an extended visit for at least an hour at the end of the day.  He is an 82 year old with a very complicated past medical history as above.  Significant for stroke includes multiple vascular risk factors including coronary artery disease status post CABG x 3, ischemic cardiomyopathy, pulmonary hypertension, congestive heart failure, left bundle branch block, prediabetes, hyperlipidemia, hypertension, mixed central and obstructive sleep apnea which is severe intolerant and needs BiPAP, COPD, afib, polycythemia vera area, morbid obesity, cardiac pacemaker, myeloproliferative neoplasm, JAK2 mutation, atherosclerosis heart disease, recent stroke.  Patient recently stopped warfarin for 5 days for colonoscopy in a few days after that wife says that he developed confusion, she feels very bad she did not take him to the emergency room right away, he had trouble figuring had a set up for coffee and usual things that he does, also quite fatigued, she finally took him to the ED which noted to have a stroke.  He was also diagnosed with dementia as well recently and has not been worked up.  She has a tremendous amount of questions.about all of it.  He was not given tPA because of completed stroke on head CT, out of the window and too mild to treat.  Reviewed notes, labs and imaging from outside physicians, which showed :  The patient split-night titration from March 08, 2021 had shown a AHI apnea-hypopnea index of high severity overall 42.8/h in supine sleep the AHI exacerbated to 65 so sleeping on his back is truly making apnea worse. The lowest oxygen saturation was 84% and the patient had 52 minutes of low oxygen at his baseline.   I reviewed all notes from the emergency room, reviewed images, patient had a right  frontotemporal infarct embolic secondary to A-fib on Coumadin and  was subtherapeutic INR.  His initial CT already showed frontal infarct so he cannot get tPA.  He had a right M2 subocclusive embolus, in addition left P1 severe stenosis, right ICA 50% stenosis, left ICA 60% stenosis.  Patient cannot get MRIs due to incompatible ICD leads.  Repeat CTs inpatient continued evolution of the infarct.  2D echo showed EF 50 to 55% and the left atrium size was severely dilated.  His LDL was 100, hemoglobin A1c was 5.6.  He was changed to Eliquis while in the hospital.  Notes did state to continue home aspirin 81 mg.  Recent Results (from the past 2160 hour(s))  Protime-INR     Status: Abnormal   Collection Time: 07/31/22 11:35 AM  Result Value Ref Range   INR 2.1 (H) 0.9 - 1.2    Comment: Reference interval is for non-anticoagulated patients. Suggested INR therapeutic range for Vitamin K antagonist therapy:    Standard Dose (moderate intensity                   therapeutic range):       2.0 - 3.0    Higher intensity therapeutic range       2.5 - 3.5    Prothrombin Time 21.8 (H) 9.1 - 12.0 sec  POCT rapid strep A     Status: Normal   Collection Time: 08/20/22  9:59 AM  Result Value Ref Range   Rapid Strep A Screen Negative Negative  Protime-INR     Status: Abnormal   Collection Time: 09/01/22  2:37 PM  Result Value Ref Range   INR 2.5 (H) 0.9 - 1.2    Comment: Reference interval is for non-anticoagulated patients. Suggested INR therapeutic range for Vitamin K antagonist therapy:    Standard Dose (moderate intensity                   therapeutic range):       2.0 - 3.0    Higher intensity therapeutic range       2.5 - 3.5    Prothrombin Time 25.6 (H) 9.1 - 12.0 sec  CMP (Cancer Center only)     Status: Abnormal   Collection Time: 09/16/22 12:23 PM  Result Value Ref Range   Sodium 135 135 - 145 mmol/L   Potassium 4.0 3.5 - 5.1 mmol/L   Chloride 99 98 - 111 mmol/L   CO2 31 22 - 32 mmol/L   Glucose, Bld 121 (H) 70 - 99 mg/dL    Comment: Glucose reference  range applies only to samples taken after fasting for at least 8 hours.   BUN 12 8 - 23 mg/dL   Creatinine 1.61 0.96 - 1.24 mg/dL   Calcium 9.8 8.9 - 04.5 mg/dL   Total Protein 6.7 6.5 - 8.1 g/dL   Albumin 4.3 3.5 - 5.0 g/dL   AST 37 15 - 41 U/L   ALT 53 (H) 0 - 44 U/L   Alkaline Phosphatase 71 38 - 126 U/L   Total Bilirubin 1.5 (H) 0.3 - 1.2 mg/dL   GFR, Estimated >40 >98 mL/min    Comment: (NOTE) Calculated using the CKD-EPI Creatinine Equation (2021)    Anion gap 5 5 - 15    Comment: Performed at [facility], [address] [place], [place] [address]  CBC with Differential (Cancer Center Only)     Status: Abnormal   Collection Time: 09/16/22 12:23  PM  Result Value Ref Range   WBC Count 13.1 (H) 4.0 - 10.5 K/uL   RBC 4.34 4.22 - 5.81 MIL/uL   Hemoglobin 16.1 13.0 - 17.0 g/dL   HCT 16.1 09.6 - 04.5 %   MCV 111.8 (H) 80.0 - 100.0 fL   MCH 37.1 (H) 26.0 - 34.0 pg   MCHC 33.2 30.0 - 36.0 g/dL   RDW 40.9 81.1 - 91.4 %   Platelet Count 404 (H) 150 - 400 K/uL   nRBC 0.0 0.0 - 0.2 %   Neutrophils Relative % 88 %   Neutro Abs 11.6 (H) 1.7 - 7.7 K/uL   Lymphocytes Relative 6 %   Lymphs Abs 0.8 0.7 - 4.0 K/uL   Monocytes Relative 4 %   Monocytes Absolute 0.5 0.1 - 1.0 K/uL   Eosinophils Relative 0 %   Eosinophils Absolute 0.0 0.0 - 0.5 K/uL   Basophils Relative 1 %   Basophils Absolute 0.1 0.0 - 0.1 K/uL   Immature Granulocytes 1 %   Abs Immature Granulocytes 0.15 (H) 0.00 - 0.07 K/uL    Comment: Performed at [facility], [address] [place], [place] [address]  Ammonia     Status: None   Collection Time: 09/16/22  2:12 PM  Result Value Ref Range   Ammonia 23 9 - 35 umol/L    Comment: Performed at [facility], [address] [place], [place] [address]  Magnesium     Status: None   Collection Time: 09/16/22  2:12 PM  Result Value Ref Range   Magnesium 1.7 1.7 - 2.4 mg/dL    Comment: Performed at  [facility], [address], [place], [place] [address]  Blood gas, venous     Status: Abnormal   Collection Time: 09/16/22  2:12 PM  Result Value Ref Range   pH, Ven 7.41 7.25 - 7.43   pCO2, Ven 46 44 - 60 mmHg   pO2, Ven <31 (LL) 32 - 45 mmHg    Comment: CRITICAL RESULT CALLED TO, READ BACK BY AND VERIFIED WITH: RN P DOWD AT 1514 09/16/22 CRUICKSHANK A    Bicarbonate 29.2 (H) 20.0 - 28.0 mmol/L   Acid-Base Excess 3.8 (H) 0.0 - 2.0 mmol/L   O2 Saturation 54.1 %   Patient temperature 37.0     Comment: Performed at [facility], [address] [place], [place] [address]  CBG monitoring, ED     Status: Abnormal   Collection Time: 09/16/22  2:42 PM  Result Value Ref Range   Glucose-Capillary 126 (H) 70 - 99 mg/dL    Comment: Glucose reference range applies only to samples taken after fasting for at least 8 hours.  TSH     Status: None   Collection Time: 09/16/22  2:51 PM  Result Value Ref Range   TSH 1.717 0.350 - 4.500 uIU/mL    Comment: Performed by a 3rd Generation assay with a functional sensitivity of <=0.01 uIU/mL. Performed at [facility], [address], [place], [place] [address]   Protime-INR     Status: Abnormal   Collection Time: 09/16/22  3:45 PM  Result Value Ref Range   Prothrombin Time 21.7 (H) 11.4 - 15.2 seconds   INR 1.9 (H) 0.8 - 1.2    Comment: (NOTE) INR goal varies based on device and disease states. Performed at [facility], [address] [place], [place] [address]   Urinalysis, Routine w  reflex microscopic -Urine, Clean Catch     Status: Abnormal   Collection Time: 09/16/22  4:34 PM  Result Value Ref Range   Color, Urine YELLOW YELLOW   APPearance CLEAR CLEAR   Specific Gravity, Urine 1.015 1.005 - 1.030   pH 6.0 5.0 - 8.0   Glucose, UA NEGATIVE NEGATIVE mg/dL   Hgb urine dipstick NEGATIVE NEGATIVE   Bilirubin Urine NEGATIVE NEGATIVE   Ketones, ur NEGATIVE NEGATIVE  mg/dL   Protein, ur 829 (A) NEGATIVE mg/dL   Nitrite NEGATIVE NEGATIVE   Leukocytes,Ua NEGATIVE NEGATIVE   RBC / HPF 0-5 0 - 5 RBC/hpf   WBC, UA 0-5 0 - 5 WBC/hpf   Bacteria, UA NONE SEEN NONE SEEN   Squamous Epithelial / HPF 0-5 0 - 5 /HPF   Mucus PRESENT     Comment: Performed at [facility], [address] [place], [place] [address]  Hemoglobin A1c     Status: None   Collection Time: 09/16/22  9:04 PM  Result Value Ref Range   Hgb A1c MFr Bld 5.6 4.8 - 5.6 %    Comment: (NOTE) Pre diabetes:          5.7%-6.4%  Diabetes:              >6.4%  Glycemic control for   <7.0% adults with diabetes    Mean Plasma Glucose 114.02 mg/dL    Comment: Performed at [facility], [address], [place], [place] [address]  Lipid panel     Status: Abnormal   Collection Time: 09/17/22  6:57 AM  Result Value Ref Range   Cholesterol 147 0 - 200 mg/dL   Triglycerides 846 <962 mg/dL   HDL 23 (L) >95 mg/dL   Total CHOL/HDL Ratio 6.4 RATIO   VLDL 24 0 - 40 mg/dL   LDL Cholesterol 284 (H) 0 - 99 mg/dL    Comment:        Total Cholesterol/HDL:CHD Risk Coronary Heart Disease Risk Table                     Men   Women  1/2 Average Risk   3.4   3.3  Average Risk       5.0   4.4  2 X Average Risk   9.6   7.1  3 X Average Risk  23.4   11.0        Use the calculated Patient Ratio above and the CHD Risk Table to determine the patient's CHD Risk.        ATP III CLASSIFICATION (LDL):  <100     mg/dL   Optimal  132-440  mg/dL   Near or Above                    Optimal  130-159  mg/dL   Borderline  102-725  mg/dL   High  >366     mg/dL   Very High Performed at [facility], [address] [place], [place] [address]   ECHOCARDIOGRAM COMPLETE     Status: None   Collection Time: 09/17/22 11:48 AM  Result Value Ref Range   Weight 3,534.41 oz   Height 67 in   BP 116/61 mmHg   Single Plane A4C EF 50.6 %   S' Lateral 3.50 cm   AR max vel 1.20 cm2   AV Area VTI  1.24 cm2   AV Mean grad 25.0 mmHg  AV Peak grad 38.7 mmHg   Ao pk vel 3.11 m/s   Area-P 1/2 4.68 cm2   AV Area mean vel 1.11 cm2   Est EF 50 - 55%   CBC with Differential/Platelet     Status: Abnormal   Collection Time: 09/18/22  6:56 AM  Result Value Ref Range   WBC 10.8 (H) 4.0 - 10.5 K/uL   RBC 4.06 (L) 4.22 - 5.81 MIL/uL   Hemoglobin 14.9 13.0 - 17.0 g/dL   HCT 16.1 09.6 - 04.5 %   MCV 111.3 (H) 80.0 - 100.0 fL   MCH 36.7 (H) 26.0 - 34.0 pg   MCHC 33.0 30.0 - 36.0 g/dL   RDW 40.9 81.1 - 91.4 %   Platelets 363 150 - 400 K/uL   nRBC 0.0 0.0 - 0.2 %   Neutrophils Relative % 81 %   Neutro Abs 8.8 (H) 1.7 - 7.7 K/uL   Lymphocytes Relative 11 %   Lymphs Abs 1.2 0.7 - 4.0 K/uL   Monocytes Relative 5 %   Monocytes Absolute 0.6 0.1 - 1.0 K/uL   Eosinophils Relative 1 %   Eosinophils Absolute 0.1 0.0 - 0.5 K/uL   Basophils Relative 1 %   Basophils Absolute 0.1 0.0 - 0.1 K/uL   Immature Granulocytes 1 %   Abs Immature Granulocytes 0.07 0.00 - 0.07 K/uL    Comment: Performed at [facility], [address] [place], [place] [address]  Basic metabolic panel     Status: Abnormal   Collection Time: 09/18/22  6:56 AM  Result Value Ref Range   Sodium 134 (L) 135 - 145 mmol/L   Potassium 3.9 3.5 - 5.1 mmol/L   Chloride 99 98 - 111 mmol/L   CO2 26 22 - 32 mmol/L   Glucose, Bld 120 (H) 70 - 99 mg/dL    Comment: Glucose reference range applies only to samples taken after fasting for at least 8 hours.   BUN 14 8 - 23 mg/dL   Creatinine, Ser 6.21 0.61 - 1.24 mg/dL   Calcium 9.1 8.9 - 30.8 mg/dL   GFR, Estimated >65 >78 mL/min    Comment: (NOTE) Calculated using the CKD-EPI Creatinine Equation (2021)    Anion gap 9 5 - 15    Comment: Performed at [facility], [address], [place], [place] [address]  CBC with Differential/Platelet     Status: Abnormal   Collection Time: 09/23/22  3:16 PM  Result Value Ref Range   WBC 10.4 3.4 - 10.8 x10E3/uL   RBC 4.12 (L) 4.14 -  5.80 x10E6/uL   Hemoglobin 15.1 13.0 - 17.7 g/dL   Hematocrit 95.2 84.1 - 51.0 %   MCV 104 (H) 79 - 97 fL   MCH 36.7 (H) 26.6 - 33.0 pg   MCHC 35.1 31.5 - 35.7 g/dL   RDW 32.4 40.1 - 02.7 %   Platelets 486 (H) 150 - 450 x10E3/uL   Neutrophils 78 Not Estab. %   Lymphs 14 Not Estab. %   Monocytes 5 Not Estab. %   Eos 1 Not Estab. %   Basos 1 Not Estab. %   Neutrophils Absolute 8.1 (H) 1.4 - 7.0 x10E3/uL   Lymphocytes Absolute 1.5 0.7 - 3.1 x10E3/uL   Monocytes Absolute 0.6 0.1 - 0.9 x10E3/uL   EOS (ABSOLUTE) 0.1 0.0 - 0.4 x10E3/uL   Basophils Absolute 0.1 0.0 - 0.2 x10E3/uL   Immature Granulocytes 1 Not Estab. %   Immature Grans (Abs)  0.1 0.0 - 0.1 x10E3/uL  Comprehensive metabolic panel     Status: Abnormal   Collection Time: 09/23/22  3:16 PM  Result Value Ref Range   Glucose 119 (H) 70 - 99 mg/dL   BUN 14 8 - 27 mg/dL   Creatinine, Ser 1.61 0.76 - 1.27 mg/dL   eGFR 71 >09 UE/AVW/0.98   BUN/Creatinine Ratio 13 10 - 24   Sodium 136 134 - 144 mmol/L   Potassium 5.3 (H) 3.5 - 5.2 mmol/L   Chloride 95 (L) 96 - 106 mmol/L   CO2 27 20 - 29 mmol/L   Calcium 9.5 8.6 - 10.2 mg/dL   Total Protein 6.7 6.0 - 8.5 g/dL   Albumin 4.3 3.7 - 4.7 g/dL   Globulin, Total 2.4 1.5 - 4.5 g/dL   Albumin/Globulin Ratio 1.8 1.2 - 2.2   Bilirubin Total 1.1 0.0 - 1.2 mg/dL   Alkaline Phosphatase 77 44 - 121 IU/L   AST 33 0 - 40 IU/L   ALT 44 0 - 44 IU/L  B12 and Folate Panel     Status: Abnormal   Collection Time: 10/22/22  4:28 PM  Result Value Ref Range   Vitamin B-12 >2000 (H) 232 - 1245 pg/mL   Folate >20.0 >3.0 ng/mL    Comment: A serum folate concentration of less than 3.1 ng/mL is considered to represent clinical deficiency.   Methylmalonic acid, serum     Status: None (Preliminary result)   Collection Time: 10/22/22  4:28 PM  Result Value Ref Range   Methylmalonic Acid WILL FOLLOW   Vitamin B1     Status: None (Preliminary result)   Collection Time: 10/22/22  4:28 PM  Result Value  Ref Range   Thiamine WILL FOLLOW   Vitamin D, 25-hydroxy     Status: None   Collection Time: 10/22/22  4:28 PM  Result Value Ref Range   Vit D, 25-Hydroxy 54.8 30.0 - 100.0 ng/mL    Comment: Vitamin D deficiency has been defined by the Institute of Medicine and an Endocrine Society practice guideline as a level of serum 25-OH vitamin D less than 20 ng/mL (1,2). The Endocrine Society went on to further define vitamin D insufficiency as a level between 21 and 29 ng/mL (2). 1. IOM (Institute of Medicine). 2010. Dietary reference    intakes for calcium and D. [place]: The    [employer]. 2. Holick MF, Binkley [place], Bischoff-Ferrari HA, et al.    Evaluation, treatment, and prevention of vitamin D    deficiency: an Endocrine Society clinical practice    guideline. JCEM. 2011 Jul; 96(7):1911-30.   Homocysteine     Status: None   Collection Time: 10/22/22  4:28 PM  Result Value Ref Range   Homocysteine 8.5 0.0 - 21.3 umol/L  Ammonia     Status: None   Collection Time: 10/22/22  4:28 PM  Result Value Ref Range   Ammonia 38 28 - 135 ug/dL  ATN PROFILE     Status: None (Preliminary result)   Collection Time: 10/22/22  4:28 PM  Result Value Ref Range   A -- Beta-amyloid 42/40 Ratio WILL FOLLOW    Beta-amyloid 42 WILL FOLLOW    Beta-amyloid 40 WILL FOLLOW    T -- [id number] WILL FOLLOW    N -- NfL, Plasma WILL FOLLOW    ATN SUMMARY WILL FOLLOW    Information: WILL FOLLOW   APOE Alzheimer's Risk     Status: None (Preliminary result)  Collection Time: 10/22/22  4:28 PM  Result Value Ref Range   Methodology: WILL FOLLOW    APO E Genotyping Result: WILL FOLLOW    Interpretation: WILL FOLLOW    Comment: WILL FOLLOW   Urinalysis, Routine w reflex microscopic     Status: None   Collection Time: 10/22/22  4:41 PM  Result Value Ref Range   Specific Gravity, UA 1.008 1.005 - 1.030   pH, UA 7.0 5.0 - 7.5   Color, UA Yellow Yellow   Appearance Ur Clear Clear   Leukocytes,UA  Negative Negative   Protein,UA Negative Negative/Trace   Glucose, UA Negative Negative   Ketones, UA Negative Negative   RBC, UA Negative Negative   Bilirubin, UA Negative Negative   Urobilinogen, Ur 0.2 0.2 - 1.0 mg/dL   Nitrite, UA Negative Negative   Microscopic Examination Comment     Comment: Microscopic not indicated and not performed.    09/17/2022:  EXAM: CT HEAD WITHOUT CONTRAST   TECHNIQUE: Contiguous axial images were obtained from the base of the skull through the vertex without intravenous contrast.   RADIATION DOSE REDUCTION: This exam was performed according to the departmental dose-optimization program which includes automated exposure control, adjustment of the mA and/or kV according to patient size and/or use of iterative reconstruction technique.   COMPARISON:  Head CT and CTA head/neck 09/16/2022.   FINDINGS: Brain: Continued evolution of the previously described infarct in the right frontal operculum. No new infarct is seen. No acute intracranial hemorrhage. No hydrocephalus or extra-axial collection. No mass effect or midline shift.   Vascular: No new hyperdense vessel or unexpected calcification.   Skull: No calvarial fracture or suspicious bone lesion. Skull base is unremarkable.   Sinuses/Orbits: Unchanged chronic right sphenoid sinusitis. Orbits are unremarkable.   Other: None.   IMPRESSION: Continued evolution of the previously described infarct in the right frontal operculum. No new infarct or acute intracranial hemorrhage.  CLINICAL DATA:  Neuro deficit, acute, stroke suspected. Altered mental status. Right frontal infarct on CT today.   EXAM: CT ANGIOGRAPHY HEAD AND NECK WITH AND WITHOUT CONTRAST   TECHNIQUE: Multidetector CT imaging of the head and neck was performed using the standard protocol during bolus administration of intravenous contrast. Multiplanar CT image reconstructions and MIPs were obtained to evaluate the vascular  anatomy. Carotid stenosis measurements (when applicable) are obtained utilizing NASCET criteria, using the distal internal carotid diameter as the denominator.   RADIATION DOSE REDUCTION: This exam was performed according to the departmental dose-optimization program which includes automated exposure control, adjustment of the mA and/or kV according to patient size and/or use of iterative reconstruction technique.   CONTRAST:  75mL OMNIPAQUE IOHEXOL 350 MG/ML SOLN   COMPARISON:  None Available.   FINDINGS: CTA NECK FINDINGS   Aortic arch: Standard 3 vessel aortic arch with mild atherosclerotic plaque. No significant stenosis of the arch vessel origins.   Right carotid system: Patent with extensive calcified and soft plaque at the carotid bifurcation and in the proximal ICA resulting in up to 55% stenosis of the proximal ICA. Tortuous mid cervical ICA.   Left carotid system: Patent with scattered calcified and soft plaque in the mid common carotid artery without significant associated stenosis. More prominent calcified plaque at the carotid bifurcation and in the proximal ICA resulting in 60% stenosis of the proximal ICA. Tortuous distal cervical ICA.   Vertebral arteries: Patent without evidence of significant stenosis or dissection.   Skeleton: Advanced cervical disc and facet degeneration.  Other neck: No evidence of cervical lymphadenopathy or mass. 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   Upper chest: No mass or consolidation in the included lung apices.   Review of the MIP images confirms the above findings   CTA HEAD FINDINGS   Anterior circulation: The internal carotid arteries are patent from skull base to carotid termini with mild atherosclerotic plaque not resulting in significant stenosis. The right M1 segment is widely patent, however there is a severe stenosis or filling defect/subocclusive embolus involving a right M2 superior  division branch vessel corresponding to the infarct on today's earlier CT (series 7, image 230 and series 11, image 22). There is attenuation of more distal right MCA branch vessels. The left MCA and both ACAs are patent without evidence of a significant proximal stenosis. No aneurysm is identified.   Posterior circulation: The intracranial vertebral arteries are patent to the basilar with the right being moderately dominant. Calcified atherosclerosis results in at most mild right V4 stenosis. Patent AICA and SCA origins are seen bilaterally. The basilar artery is widely patent. There are small posterior communicating arteries bilaterally. Both PCAs are patent without evidence of a significant proximal stenosis on the right. There is a severe left P1 stenosis. No aneurysm is identified.   Venous sinuses: Patent.   Anatomic variants: None.   Review of the MIP images confirms the above findings   IMPRESSION: 1. Suspected subocclusive embolus in a right M2 superior division branch vessel corresponding to the infarct on today's earlier head CT. 2. Severe left P1 stenosis. 3. 55% proximal right ICA stenosis. 4. 60% proximal left ICA stenosis. 5.  Aortic Atherosclerosis (ICD10-I70.0).     Review of Systems: Patient complains of symptoms per HPI as well as the following symptoms stroke,dementia. Pertinent negatives and positives per HPI. All others negative.   Social History   Socioeconomic History   Marital status: Married    Spouse name: [name] [name]    Number of children: 2   Years of education: 13   Highest education level: Not on file  Occupational History   Occupation: Retired  Tobacco Use   Smoking status: Former    Packs/day: 2.00    Years: 25.00    Additional pack years: 0.00    Total pack years: 50.00    Types: Cigarettes    Quit date: 05/20/1983    Years since quitting: 39.4   Smokeless tobacco: Never  Vaping Use   Vaping Use: Never used  Substance and Sexual  Activity   Alcohol use: Never   Drug use: Never   Sexual activity: Not Currently  Other Topics Concern   Not on file  Social History Narrative   Lives with wife, [name] [name]   Caffeine use: daily (Coffee)   Social Determinants of Health   Financial Resource Strain: Not on file  Food Insecurity: Not on file  Transportation Needs: No Transportation Needs (03/29/2021)   PRAPARE - Administrator, Civil Service (Medical): No    Lack of Transportation (Non-Medical): No  Physical Activity: Not on file  Stress: Not on file  Social Connections: Not on file  Intimate Partner Violence: Not on file    Family History  Problem Relation Age of Onset   Alzheimer's disease Mother    Mental illness Mother    Depression Mother    Heart disease Father 15       had 5 MI's & died 26 yo   Hypertension Father    Alcohol abuse Son  Heart attack Son    Stroke Maternal Grandmother    Dementia Maternal Grandmother    Cancer - Prostate Other    Colon cancer Neg Hx    Stomach cancer Neg Hx    Esophageal cancer Neg Hx     Past Medical History:  Diagnosis Date   A-fib (HCC) 01/2014   AICD (automatic cardioverter/defibrillator) present    [employer]   Anal fissure 11/10/2019   Arthritis    ASHD (arteriosclerotic heart disease) 2015   s/p CABG   Cataract    s/p left   CHF (congestive heart failure) (HCC)    COPD (chronic obstructive pulmonary disease) (HCC)    by CXR, pt unsure of this   Diverticulosis    GERD (gastroesophageal reflux disease)    Gout 2014   Hyperlipidemia    Hypertension    Hypothyroidism    LBBB (left bundle branch block)    Polycythemia vera (HCC)    Prediabetes 01/2014   Presence of permanent cardiac pacemaker    Sleep apnea    no CPAP    Patient Active Problem List   Diagnosis Date Noted   Acute CVA (cerebrovascular accident) (HCC) 09/16/2022   Chronic diastolic CHF (congestive heart failure) (HCC) 09/16/2022   Encounter for screening  colonoscopy 08/26/2022   Chronic ulcer of left foot with fat layer exposed (HCC) 07/10/2022   Zenker's diverticulum 10/16/2021   Loss of appetite 10/10/2021   Loss of weight 10/10/2021   Generalized abdominal pain 10/10/2021   Thrombosed external hemorrhoid 10/10/2021   Dysphagia 08/01/2021   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   Intolerance of continuous positive airway pressure (CPAP) ventilation 05/02/2021   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   JAK2 V617F mutation 08/27/2020   Rectal pain 11/10/2019   Constipation 11/10/2019   Abnormal glucose 10/12/2019   Memory changes 01/10/2019   Polycythemia vera (HCC) 10/21/2018   Thrombocytosis 12/29/2017   Senile purpura (HCC) 09/01/2017   Emphysema of lung (HCC) 06/01/2017   Tortuous aorta (HCC)- CXR 2018 06/01/2017   Regular astigmatism, right eye 10/16/2016   Combined forms of age-related cataract of right eye 10/16/2016   Trigger ring finger of left hand 08/06/2016   Bilateral carpal tunnel syndrome 08/06/2016   Ischemic cardiomyopathy 06/22/2015   ASHD/CABG x 3V (11/2013) 02/06/2015   Acquired thrombophilia (HCC) 02/06/2015   Cardiac pacemaker/AICD (01/2014) 02/06/2015   Morbid obesity (HCC) 01/03/2015   HTN (hypertension) 01/02/2015   Hyperlipidemia, mixed 01/02/2015   Vitamin D deficiency 01/02/2015   GERD  01/02/2015   BPH (benign prostatic hyperplasia) 01/02/2015   Medication management 01/02/2015   Atrial fibrillation (HCC) 01/02/2015   Hypothyroidism 01/02/2015   OSA and COPD overlap syndrome (HCC) 01/02/2015   Chronic systolic (congestive) heart failure (HCC) 02/17/2014   S/P CABG x 3 02/13/2014   Atherosclerotic heart disease of native coronary artery without angina pectoris 02/08/2014   Gout 02/08/2014   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) 02/06/2014    Past Surgical History:  Procedure Laterality Date   CARDIAC DEFIBRILLATOR PLACEMENT  07/2014   CATARACT EXTRACTION W/  INTRAOCULAR LENS IMPLANT Left    COLONOSCOPY  08/26/2022   normal   CORONARY ARTERY BYPASS GRAFT  11/2013   ESOPHAGOGASTRODUODENOSCOPY  08/26/2022   esophageal dilation   lumb laminectomy  [id number]   x 3   LUMBAR FUSION  2013   NASAL SEPTOPLASTY W/ TURBINOPLASTY Bilateral 01/30/2016   Procedure: NASAL SEPTOPLASTY WITH BILATERAL TURBINATE REDUCTION;  Surgeon: Drema Halon,  MD;  Location: [facility] OR;  Service: ENT;  Laterality: Bilateral;   TONSILLECTOMY     UVULECTOMY N/A 01/30/2016   Procedure: UVULECTOMY;  Surgeon: Drema Halon, MD;  Location[facility] OR;  Service: ENT;  Laterality: N/A;    Current Outpatient Medications  Medication Sig Dispense Refill   acetaminophen (TYLENOL) 500 MG tablet Take 500 mg by mouth as needed for mild pain.     allopurinol (ZYLOPRIM) 300 MG tablet TAKE 1 TABLET EVERY DAY TO PREVENT GOUT (Patient taking differently: Take 300 mg by mouth daily.) 90 tablet 3   apixaban (ELIQUIS) 5 MG TABS tablet Take 1 tablet (5 mg total) by mouth 2 (two) times daily. 60 tablet 2   aspirin EC 81 MG tablet Take 81 mg by mouth daily.     Biotin 47829 MCG TABS Take 1 tablet by mouth daily.     Cyanocobalamin (B-12) 1000 MCG CAPS Take 1 capsule by mouth daily.     Docosahexaenoic Acid (DHA OMEGA 3 PO) Take 1 tablet by mouth daily.     docusate sodium (COLACE) 100 MG capsule Take 100 mg by mouth 2 (two) times daily. 1 tablet in the morning and 1 tablet at bedtime (stool softener)     ezetimibe (ZETIA) 10 MG tablet Take 1 tab daily at night for cholesterol. (Patient taking differently: Take 10 mg by mouth daily.) 90 tablet 3   finasteride (PROSCAR) 5 MG tablet TAKE 1 TABLET EVERY DAY FOR PROSTATE (Patient taking differently: Take 5 mg by mouth daily.) 90 tablet 3   hydroxyurea (HYDREA) 500 MG capsule TAKE 2 CAPSULES ONE TIME DAILY WITH FOOD (Patient taking differently: Take 100 mg by mouth daily.) 180 capsule 3   isosorbide mononitrate (IMDUR) 30 MG 24 hr tablet Take 1  tablet (30 mg total) by mouth daily. Take by mouth daily. (Patient taking differently: Take 30 mg by mouth daily.) 90 tablet 3   levothyroxine (SYNTHROID) 200 MCG tablet TAKE 1 TAB DAILY ON EMPTY STOMACH WITH ONLY WATER FOR 30 MINS. NO ANTACIDS, CALCIUM OR MAGNESIUM FOR 4 HOURS. AVOID BIOTIN. (Patient taking differently: Take 200 mcg by mouth daily before breakfast.) 90 tablet 3   magnesium oxide (MAG-OX) 400 MG tablet Take 400 mg by mouth daily.     MELATONIN PO Take 1 tablet by mouth daily as needed (For sleep).     metoprolol (TOPROL-XL) 200 MG 24 hr tablet Take 200 mg by mouth daily.     Multiple Vitamin (MULTIVITAMIN) tablet Take 1 tablet by mouth daily.     omeprazole (PRILOSEC) 20 MG capsule TAKE 1 CAPSULE TWICE DAILY  ( EVERY 12 HOURS  ) TO PREVENT HEARTBURN AND ACID REFLUX (Patient taking differently: Take 20 mg by mouth daily.) 180 capsule 3   Probiotic Product (PROBIOTIC-10 PO) Take 1 tablet by mouth daily.     rosuvastatin (CRESTOR) 40 MG tablet Take 1 tablet (40 mg total) by mouth daily. 90 tablet 3   tamsulosin (FLOMAX) 0.4 MG CAPS capsule Take 1 tablet at Bedtime for Prostate & Bladder (Patient taking differently: Take 0.4 mg by mouth daily after supper.) 90 capsule 3   torsemide (DEMADEX) 20 MG tablet Take 1 tablet every Morning for Swelling in legs (Patient taking differently: Take 20 mg by mouth daily.) 90 tablet 3   Zinc 50 MG TABS Take 1 tablet by mouth daily.     No current facility-administered medications for this visit.    Allergies as of 10/22/2022 - Review Complete 10/22/2022  Allergen  Reaction Noted   Other Swelling 01/18/2016    Vitals: BP 107/65   Pulse 71   Ht 5\' 7"  (1.702 m)   Wt 233 lb 9.6 oz (106 kg)   BMI 36.59 kg/m  Last Weight:  Wt Readings from Last 1 Encounters:  10/22/22 233 lb 9.6 oz (106 kg)   Last Height:   Ht Readings from Last 1 Encounters:  10/22/22 5\' 7"  (1.702 m)     Physical exam: Exam: Gen: NAD, conversant, well nourised, obese,  well groomed                     CV: irregular No Carotid Bruits. No peripheral edema, warm, nontender Eyes: Conjunctivae clear without exudates or hemorrhage  Neuro: Detailed Neurologic Exam  Speech:    Speech is normal; fluent and spontaneous with impaired comprehension.  Cognition:    10/22/2022    3:08 PM  MMSE - Mini Mental State Exam  Orientation to time 1  Orientation to Place 3  Registration 3  Attention/ Calculation 1  Recall 0  Language- name 2 objects 2  Language- repeat 1  Language- follow 3 step command 3  Language- read & follow direction 1  Write a sentence 0  Copy design 1  Total score 16    Cranial Nerves:    The pupils are equal, round, and reactive to light. Attempted, pupils too small to visualze. Visual fields are full to finger confrontation. Extraocular movements are intact. Trigeminal sensation is intact and the muscles of mastication are normal. The face is symmetric. The palate elevates in the midline. Hearing intact. Voice is normal. Shoulder shrug is normal. The tongue has normal motion without fasciculations.   Coordination: No dysmetria or ataxia noted  Gait: Stooped with decreased arm swing  Motor Observation:    No asymmetry, no atrophy, and no involuntary movements noted. Tone:    Normal muscle tone.    Posture: Slightly stooped    Strength:    Strength is V/V in the upper and lower limbs.      Sensation: intact to LT     Reflex Exam:  DTR's:    Deep tendon reflexes in the upper and lower extremities are symmetrical bilaterally.   Toes:    The toes are equiv bilaterally.   Clonus:    Clonus is absent.    Assessment/Plan:  ADDIS MARTIS is a 83 y.o. male here as requested by Marvel Plan, MD for stroke. has HTN (hypertension); Hyperlipidemia, mixed; Vitamin D deficiency; GERD ; BPH (benign prostatic hyperplasia); Medication management; Atrial fibrillation (HCC); Hypothyroidism; OSA and COPD overlap syndrome (HCC); Morbid  obesity (HCC); ASHD/CABG x 3V (11/2013); Acquired thrombophilia (HCC); Cardiac pacemaker/AICD (01/2014); Emphysema of lung (HCC); Tortuous aorta (HCC)- CXR 2018; Senile purpura (HCC); Thrombocytosis; Polycythemia vera (HCC); Memory changes; Abnormal glucose; Rectal pain; Constipation; MPN (myeloproliferative neoplasm) (HCC); JAK2 V617F mutation; Complex sleep apnea syndrome; Intolerance of continuous positive airway pressure (CPAP) ventilation; Dysphagia; Loss of appetite; Loss of weight; Generalized abdominal pain; Thrombosed external hemorrhoid; Zenker's diverticulum; Atherosclerotic heart disease of native coronary artery without angina pectoris; Trigger ring finger of left hand; S/P CABG x 3; Regular astigmatism, right eye; 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   They are referred for stroke follow up but wife is insistent she wants to be evaluated for alzheimers and stroke which is very difficult to do in one appointment,extended visit.When results are back will need to set up at least an hour follow up or more, patient's wife had a page full of written questions.  Stroke: patient had a right frontotemporal infarct embolic secondary to A-fib on Coumadin and was subtherapeutic INR.  His initial CT already showed frontal infarct so he cannot get tPA.  He had a right M2 subocclusive embolus, in addition left P1 severe stenosis, right ICA 50% stenosis, left ICA 60% stenosis.  Patient cannot get MRIs due to incompatible ICD leads.  Repeat CTs inpatient continued evolution of the infarct.  2D echo showed EF 50 to  55% and the left atrium size was severely dilated.  His LDL was 100, hemoglobin A1c was 5.6.  He was changed to Eliquis while in the hospital.  Notes did state to continue home aspirin 81 mg.  -Continue Eliquis.  Also continue aspirin for coronary artery disease. -Goal for hypertension is normotensive -His LDL was elevated, now goal is less than 70, now on Crestor 40 and Zetia and recommend he follows with cardiology in lipid clinic to consider PCSK9 inhibitor such as Repatha -He has multiple risk factors.  We discussed his severe obstructive sleep apnea not using BiPAP which can cause multiple sequelae including dementia and stroke.  But he cannot tolerate it and they refuse any follow-up for this.  He is of advanced age, history of stroke TIA, coronary artery disease and polycythemia with positive JAK2 in addition to documentation above. -I discussed obesity and weight loss and exercise and diet -Patient has a pacemaker/AICD cannot get MRIs -Needs to follow-up for pulmonary hypertension, left bundle branch block, dilated left atrium, lipid clinic, cardiology, and I highly recommended sleep doctor but they declined  Dementia: MMSE 16 out of 30.  We discussed dementia at length different kinds of dementia.  From his symptoms it sounds as though he may have Alzheimer's.  Patient's wife wanted him to be treated unfortunately he really needs a thorough evaluation before treating him for his cognitive problems.  We discussed medication such as Aricept and Namenda, also Lecanto Mab which he is likely not a candidate for given 16 out of 22 and his other comorbidities and risk of bleeding.  We decided with wife to perform a workup and then follow-up for an extended period of time began to review any stroke question she has in addition to the above, and discussed his dementia and treatment.    Orders Placed This Encounter  Procedures   Culture, Urine   NM PET Brain Amyloid   B12 and Folate Panel    Methylmalonic acid, serum   Vitamin B1   Vitamin D, 25-hydroxy   Homocysteine   Ammonia   ATN PROFILE   Urinalysis, Routine w reflex microscopic   APOE Alzheimer's Risk   EEG adult   Cc: Marvel Plan, MD,  Suzan Slick, MD  Naomie Dean, MD  [facility] [address] [place], [place] [address]  Phone [phone] Fax [phone]  I spent over 85 minutes of face-to-face and non-face-to-face time with patient on the  1. Moderate late onset screen for Alzheimer's dementia, unspecified whether behavioral, psychotic, or mood disturbance or anxiety (HCC)   2. screen for Vitamin B1 deficiency   3. screen for Vitamin B12  deficiency   4. Cerebrovascular accident (CVA), unspecified mechanism (HCC)   5. screen for Vitamin D deficiency   6. screen for Urinary tract infection without hematuria, site unspecified    diagnosis.  This included previsit chart review, lab review, study review, order entry, electronic health record documentation, patient education on the different diagnostic and therapeutic options, counseling and coordination of care, risks and benefits of management, compliance, or risk factor reduction

## 2022-10-23 ENCOUNTER — Encounter: Payer: Self-pay | Admitting: Neurology

## 2022-10-23 ENCOUNTER — Ambulatory Visit: Payer: Medicare Other

## 2022-10-23 DIAGNOSIS — R41841 Cognitive communication deficit: Secondary | ICD-10-CM | POA: Diagnosis not present

## 2022-10-23 LAB — URINALYSIS, ROUTINE W REFLEX MICROSCOPIC
Bilirubin, UA: NEGATIVE
Glucose, UA: NEGATIVE
Ketones, UA: NEGATIVE
Leukocytes,UA: NEGATIVE
Nitrite, UA: NEGATIVE
Protein,UA: NEGATIVE
RBC, UA: NEGATIVE
Specific Gravity, UA: 1.008 (ref 1.005–1.030)
Urobilinogen, Ur: 0.2 mg/dL (ref 0.2–1.0)
pH, UA: 7 (ref 5.0–7.5)

## 2022-10-23 LAB — ATN PROFILE

## 2022-10-23 LAB — APOE ALZHEIMER'S RISK

## 2022-10-23 LAB — HOMOCYSTEINE: Homocysteine: 8.5 umol/L (ref 0.0–21.3)

## 2022-10-23 NOTE — Therapy (Signed)
OUTPATIENT SPEECH LANGUAGE PATHOLOGY TREATMENT   Patient Name: [name] [name] MRN: [id number] DOB:Feb 26, 1941, 82 y.o., male Today's Date: 10/23/2022  PCP: Suzan Slick, MD  REFERRING PROVIDER: Suzan Slick, MD   END OF SESSION:  End of Session - 10/23/22 1532     Visit Number 4    Number of Visits 17    Date for SLP Re-Evaluation 11/27/22    Authorization Type Medicare    SLP Start Time 1532    SLP Stop Time  1615    SLP Time Calculation (min) 43 min    Activity Tolerance Patient tolerated treatment well   limited somewhat by pt's attention              Past Medical History:  Diagnosis Date   A-fib (HCC) 01/2014   AICD (automatic cardioverter/defibrillator) present    [employer]   Anal fissure 11/10/2019   Arthritis    ASHD (arteriosclerotic heart disease) 2015   s/p CABG   Cataract    s/p left   CHF (congestive heart failure) (HCC)    COPD (chronic obstructive pulmonary disease) (HCC)    by CXR, pt unsure of this   Diverticulosis    GERD (gastroesophageal reflux disease)    Gout 2014   Hyperlipidemia    Hypertension    Hypothyroidism    LBBB (left bundle branch block)    Polycythemia vera (HCC)    Prediabetes 01/2014   Presence of permanent cardiac pacemaker    Sleep apnea    no CPAP   Past Surgical History:  Procedure Laterality Date   CARDIAC DEFIBRILLATOR PLACEMENT  07/2014   CATARACT EXTRACTION W/ INTRAOCULAR LENS IMPLANT Left    COLONOSCOPY  08/26/2022   normal   CORONARY ARTERY BYPASS GRAFT  11/2013   ESOPHAGOGASTRODUODENOSCOPY  08/26/2022   esophageal dilation   lumb laminectomy  1191,[id number],9562   x 3   LUMBAR FUSION  2013   NASAL SEPTOPLASTY W/ TURBINOPLASTY Bilateral 01/30/2016   Procedure: NASAL SEPTOPLASTY WITH BILATERAL TURBINATE REDUCTION;  Surgeon: Drema Halon, MD;  Location: [facility] OR;  Service: ENT;  Laterality: Bilateral;   TONSILLECTOMY     UVULECTOMY N/A 01/30/2016   Procedure: UVULECTOMY;  Surgeon:  Drema Halon, MD;  Location: [facility] OR;  Service: ENT;  Laterality: N/A;   Patient Active Problem List   Diagnosis Date Noted   Acute CVA (cerebrovascular accident) (HCC) 09/16/2022   Chronic diastolic CHF (congestive heart failure) (HCC) 09/16/2022   Encounter for screening colonoscopy 08/26/2022   Chronic ulcer of left foot with fat layer exposed (HCC) 07/10/2022   Zenker's diverticulum 10/16/2021   Loss of appetite 10/10/2021   Loss of weight 10/10/2021   Generalized abdominal pain 10/10/2021   Thrombosed external hemorrhoid 10/10/2021   Dysphagia 08/01/2021   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   Intolerance of continuous positive airway pressure (CPAP) ventilation 05/02/2021   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   JAK2 V617F mutation 08/27/2020   Rectal pain 11/10/2019   Constipation 11/10/2019   Abnormal glucose 10/12/2019   Memory changes 01/10/2019   Polycythemia vera (HCC) 10/21/2018   Thrombocytosis 12/29/2017   Senile purpura (HCC) 09/01/2017   Emphysema of lung (HCC) 06/01/2017   Tortuous aorta (HCC)- CXR 2018 06/01/2017   Regular astigmatism, right eye 10/16/2016   Combined forms of age-related cataract of right eye 10/16/2016   Trigger ring finger of left hand 08/06/2016   Bilateral carpal tunnel syndrome 08/06/2016   Ischemic cardiomyopathy 06/22/2015   ASHD/CABG  x 3V (11/2013) 02/06/2015   Acquired thrombophilia (HCC) 02/06/2015   Cardiac pacemaker/AICD (01/2014) 02/06/2015   Morbid obesity (HCC) 01/03/2015   HTN (hypertension) 01/02/2015   Hyperlipidemia, mixed 01/02/2015   Vitamin D deficiency 01/02/2015   GERD  01/02/2015   BPH (benign prostatic hyperplasia) 01/02/2015   Medication management 01/02/2015   Atrial fibrillation (HCC) 01/02/2015   Hypothyroidism 01/02/2015   OSA and COPD overlap syndrome (HCC) 01/02/2015   Chronic systolic (congestive) heart failure (HCC) 02/17/2014   S/P CABG x 3 02/13/2014   Atherosclerotic heart  disease of native coronary artery without angina pectoris 02/08/2014   Gout 02/08/2014   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) 02/06/2014    ONSET DATE: 09/16/22   REFERRING DIAG:  I63.9 (ICD-10-CM) - Acute CVA (cerebrovascular accident) (HCC)  I69.319 (ICD-10-CM) - Cognitive deficit due to recent stroke   THERAPY DIAG: Cognitive communication deficit  Rationale for Evaluation and Treatment: Rehabilitation  SUBJECTIVE:   SUBJECTIVE STATEMENT: "He's getting some of his humor back."     "[name], I just don't remember."  Pt accompanied by: significant other [name])  PERTINENT HISTORY: Pt is an 82 y.o. male who presented 09/16/22 with AMS. CT showed potentially acute or subacute infarct in the right frontal lobe. PMH: mild cognitive impairment, ischemic cardiomyopathy and coronary artery disease s/p CABG x 3 (2015), pulmonary hypertension, CHF, left bundle branch block, prediabetes, HLD, HTN, mixed central and OSA intolerant of CPAP, COPD, hypothyroidism, lumbar disc disease s/p fusion, polycythemia vera (JAK2 positive on hydroxyurea). MD notes mentioned dementia    PAIN: Are you having pain? No  FALLS: Has patient fallen in last 6 months?  No, See PT evaluation for details  PATIENT GOALS: improve memory  OBJECTIVE:   PATIENT REPORTED OUTCOME MEASURES (PROM): Not completed d/t time constraints   TODAY'S TREATMENT:                                                                                                                                         10/23/22: Given pt's "s" statement and wife's desire to have pt recall events, SLP suggested a "memory book" with a journal section. SLP explained this to pt and wife and how pt could utilize this book so that wife's questions are therapeutic instead of what pt called a "test". After looking at great grandson's picture, saying his name, and repeating great grandson's name once to SLP, [name] asked pt the name 4 seconds later and pt  could not recall. SLP strongly suggested pt have pictures of family as a section in his memory book, and [name] write in the names of members for pt to rehearse daily. SLP explained written cues are helpful for pts' recall, after [name] stated pt needs to do it on his own and should not use written cues. Wife described a intermittent difficulty with the coffee machine (pod), and SLP suggested she and pt  go through a set of directions she will write down  for pt. SLP reiterated about written cues are helpful, as above.  10/08/22: Educated pt and wife re: how to improve retrieval of family names. To improve pt memory and independence with schedule management, suggested to pt and wife to review next day's appointments each day in the afternoon, write into the schedule for the next day, and then plan anything else they want to do tomorrow at that time, and write into the schedule. Pt can have schedule reinforced in the morning with coffee. [name] said pt forgot why they were in the car traveling here despite her telling him shortly before he demonstrated he had forgotten, so SLP suggested sticky note on dash to assist pt in recall of what medical professional they are traveling to see.  [name] shared an errand to the farmers market occurring after this appointment. Patient recalled, given extra time, that they were going to the farmers market, but did not recall why. SLP engaged in spaced retrieval with pt for the reason. After 30 second interval  patient again required cues for the reason going to the farmers market. To foster recall, SLP had pt write down [employer], and "pink flowers" and due to time, SLP wrote on sticky note for [name] to put on dash. SLP told [name], ideally, would have wanted pt to have written on the sticky note and encouraged her to do the same at home. Reiterated the need for routine for memory improvement via procedural memory.  10/06/22: Pt wife [name] stated that pt does not need to learn how to  use the remote, and only showers twice a week. She had a long explanation why she would like him to learn how to pay bills again.  Although notes from 10/02/22 state wife was educated about exacerbation of dementia in cognitive communication post CVA, SLP reiterated this to wife today again for sake of clarity. SLP also explained/educated pt and wife that ST will work with pt and wife to have pt regain as much function as possible after CVA but that dx of dementia decreases clarity of pt's overall prognosis in ST. Today, pt answered "I don't remember" of "I don't recall" to 7/8 questions asked of him today and looked to wife to ansewr for him. Wife told SLP she found pt standing wet out of shower over the weekend with towel on floor and had to cue pt to dry off and begin after shower routine. SLP assisted wife/pt making a checklist and encouraged wife to place checklist in bathroom. SLP educated wife she may need to assist pt with routine for the first 3-5 times to foster recall of routine. Lastly, wife very concerned about bill paying and pt's ability to regain that skill. SLP told wife to sit with pt to see what is recalled from rote memory. SLP will assist in augmenting with written cues and teach wife how to cue pt for this task next session.  SLP questions pt's need for formal cognitive testing (CLQT) due to severity of deficits.  Pt may benefit more at a later date.   10/02/22: Educated wife on typical exacerbation of dementia cognitive deficits secondary to stroke. Educated pt and wife on plan to address functional goals, such as making coffee, working remote, recalling family names, etc to optimize life participation and reduce caregiver burden. Both verbalized understanding and agreement.   PATIENT EDUCATION: Education details: see above  Person educated: Patient and Spouse Education method: Explanation and  Demonstration Education comprehension: verbalized understanding, returned demonstration, and  needs further education   GOALS: Goals reviewed with patient? Yes  SHORT TERM GOALS: Target date: 10/30/2022  Pt will sequence steps to operate coffee maker given external supports and spaced retrieval for 2/2 opportunities with occasional max A  Baseline: Goal status: IN PROGRESS  2.  Pt will operate simple functions of television remote (power, volume, channel) with cognitive supports for 2/2 opportunities with occasional max A  Baseline:  Goal status: IN PROGRESS  3.  Pt will effectively use memory aid to name pertinent family members for 2/2 opportunities given occasional max A Baseline:  Goal status: IN PROGRESS  4.  Wife will carryover trained techniques to maximize patient's participation in conversation and home tasks given occasional max A  Baseline:  Goal status: IN PROGRESS   LONG TERM GOALS: Target date: 11/27/2022  Pt will ID personal information in memory book (address, family members, medical providers) with occasional mod A from wife > 1 week Baseline:  Goal status: IN PROGRESS  2.  Pt will be able to carryover targeted functional routines (making coffee, operating remote) with occasional mod A from wife > 1 week Baseline:  Goal status: IN PROGRESS  3.  Wife will appropriately ID and implement necessary memory supports at home to aid cognitive functioning for 2/2 opportunities  Baseline:  Goal status: IN PROGRESS   ASSESSMENT:  CLINICAL IMPRESSION: Patient is a 82 y.o. M who was seen today for cognitive changes s/p recent CVA. PMHX significant for early onset dementia. Prior to stroke, pt was making his own coffee, managing his medications with personalized system and occasional cues from wife, and attending MD appointments alone. Some reduced facial recognition of family members indicated prior to stroke. Since stroke, pt experiencing increased significant difficulty operating coffee makers x2 (pods and pot), using television remote, recalling family members  names, and recalling completion of tasks (I.e., washing hair multiple times). Pt and wife would benefit from skilled ST intervention to optimize understanding of cognitive challenges related to dementia and stroke and to carryover learned techniques to aid patient life participation. See "today's treatment" for more details.   OBJECTIVE IMPAIRMENTS: include attention, memory, awareness, and executive functioning. These impairments are limiting patient from managing medications, household responsibilities, and ADLs/IADLs. Factors affecting potential to achieve goals and functional outcome are ability to learn/carryover information, co-morbidities, previous level of function, and severity of impairments.. Patient will benefit from skilled SLP services to address above impairments and improve overall function.  REHAB POTENTIAL: Good  PLAN:  SLP FREQUENCY: 2x/week  SLP DURATION: 8 weeks  PLANNED INTERVENTIONS: Environmental controls, Cueing hierachy, Cognitive reorganization, Internal/external aids, Functional tasks, SLP instruction and feedback, Compensatory strategies, and Patient/family education    [facility], CCC-SLP 10/23/2022, 3:34 PM

## 2022-10-24 LAB — URINE CULTURE: Organism ID, Bacteria: NO GROWTH

## 2022-10-24 LAB — VITAMIN B1

## 2022-10-24 LAB — APOE ALZHEIMER'S RISK

## 2022-10-24 LAB — METHYLMALONIC ACID, SERUM: Methylmalonic Acid: 217 nmol/L (ref 0–378)

## 2022-10-25 LAB — AMMONIA: Ammonia: 38 ug/dL (ref 28–135)

## 2022-10-25 LAB — VITAMIN D 25 HYDROXY (VIT D DEFICIENCY, FRACTURES): Vit D, 25-Hydroxy: 54.8 ng/mL (ref 30.0–100.0)

## 2022-10-25 LAB — APOE ALZHEIMER'S RISK

## 2022-10-28 ENCOUNTER — Ambulatory Visit: Payer: Medicare Other

## 2022-10-28 LAB — ATN PROFILE: Beta-amyloid 40: 328.05 pg/mL

## 2022-10-30 ENCOUNTER — Ambulatory Visit: Payer: Medicare Other

## 2022-10-30 NOTE — Telephone Encounter (Signed)
Completed per Francesca Jewett

## 2022-11-04 ENCOUNTER — Ambulatory Visit: Payer: Medicare Other

## 2022-11-04 DIAGNOSIS — R41841 Cognitive communication deficit: Secondary | ICD-10-CM | POA: Diagnosis not present

## 2022-11-04 NOTE — Therapy (Signed)
OUTPATIENT SPEECH LANGUAGE PATHOLOGY TREATMENT   Patient Name: [name] [name] MRN: [id number] DOB:January 08, 1941, 82 y.o., male Today's Date: 11/04/2022  PCP: Suzan Slick, MD  REFERRING PROVIDER: Suzan Slick, MD   END OF SESSION:  End of Session - 11/04/22 1324     Visit Number 5    Number of Visits 17    Date for SLP Re-Evaluation 11/27/22    SLP Start Time 1319    SLP Stop Time  1400    SLP Time Calculation (min) 41 min    Activity Tolerance Patient tolerated treatment well               Past Medical History:  Diagnosis Date   A-fib (HCC) 01/2014   AICD (automatic cardioverter/defibrillator) present    [employer]   Anal fissure 11/10/2019   Arthritis    ASHD (arteriosclerotic heart disease) 2015   s/p CABG   Cataract    s/p left   CHF (congestive heart failure) (HCC)    COPD (chronic obstructive pulmonary disease) (HCC)    by CXR, pt unsure of this   Diverticulosis    GERD (gastroesophageal reflux disease)    Gout 2014   Hyperlipidemia    Hypertension    Hypothyroidism    LBBB (left bundle branch block)    Polycythemia vera (HCC)    Prediabetes 01/2014   Presence of permanent cardiac pacemaker    Sleep apnea    no CPAP   Past Surgical History:  Procedure Laterality Date   CARDIAC DEFIBRILLATOR PLACEMENT  07/2014   CATARACT EXTRACTION W/ INTRAOCULAR LENS IMPLANT Left    COLONOSCOPY  08/26/2022   normal   CORONARY ARTERY BYPASS GRAFT  11/2013   ESOPHAGOGASTRODUODENOSCOPY  08/26/2022   esophageal dilation   lumb laminectomy  7829,[id number],[id number]   x 3   LUMBAR FUSION  2013   NASAL SEPTOPLASTY W/ TURBINOPLASTY Bilateral 01/30/2016   Procedure: NASAL SEPTOPLASTY WITH BILATERAL TURBINATE REDUCTION;  Surgeon: Drema Halon, MD;  Location: [facility] OR;  Service: ENT;  Laterality: Bilateral;   TONSILLECTOMY     UVULECTOMY N/A 01/30/2016   Procedure: UVULECTOMY;  Surgeon: Drema Halon, MD;  Location: [facility] OR;  Service: ENT;  Laterality:  N/A;   Patient Active Problem List   Diagnosis Date Noted   Acute CVA (cerebrovascular accident) (HCC) 09/16/2022   Chronic diastolic CHF (congestive heart failure) (HCC) 09/16/2022   Encounter for screening colonoscopy 08/26/2022   Chronic ulcer of left foot with fat layer exposed (HCC) 07/10/2022   Zenker's diverticulum 10/16/2021   Loss of appetite 10/10/2021   Loss of weight 10/10/2021   Generalized abdominal pain 10/10/2021   Thrombosed external hemorrhoid 10/10/2021   Dysphagia 08/01/2021   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   Intolerance of continuous positive airway pressure (CPAP) ventilation 05/02/2021   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   JAK2 V617F mutation 08/27/2020   Rectal pain 11/10/2019   Constipation 11/10/2019   Abnormal glucose 10/12/2019   Memory changes 01/10/2019   Polycythemia vera (HCC) 10/21/2018   Thrombocytosis 12/29/2017   Senile purpura (HCC) 09/01/2017   Emphysema of lung (HCC) 06/01/2017   Tortuous aorta (HCC)- CXR 2018 06/01/2017   Regular astigmatism, right eye 10/16/2016   Combined forms of age-related cataract of right eye 10/16/2016   Trigger ring finger of left hand 08/06/2016   Bilateral carpal tunnel syndrome 08/06/2016   Ischemic cardiomyopathy 06/22/2015   ASHD/CABG x 3V (11/2013) 02/06/2015   Acquired thrombophilia (HCC) 02/06/2015  Cardiac pacemaker/AICD (01/2014) 02/06/2015   Morbid obesity (HCC) 01/03/2015   HTN (hypertension) 01/02/2015   Hyperlipidemia, mixed 01/02/2015   Vitamin D deficiency 01/02/2015   GERD  01/02/2015   BPH (benign prostatic hyperplasia) 01/02/2015   Medication management 01/02/2015   Atrial fibrillation (HCC) 01/02/2015   Hypothyroidism 01/02/2015   OSA and COPD overlap syndrome (HCC) 01/02/2015   Chronic systolic (congestive) heart failure (HCC) 02/17/2014   S/P CABG x 3 02/13/2014   Atherosclerotic heart disease of native coronary artery without angina pectoris 02/08/2014    Gout 02/08/2014   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) 02/06/2014    ONSET DATE: 09/16/22   REFERRING DIAG:  I63.9 (ICD-10-CM) - Acute CVA (cerebrovascular accident) (HCC)  I69.319 (ICD-10-CM) - Cognitive deficit due to recent stroke   THERAPY DIAG: Cognitive communication deficit  Rationale for Evaluation and Treatment: Rehabilitation  SUBJECTIVE:   SUBJECTIVE STATEMENT: "You know life is a funny thing. Some things go well and others don't go so well."  Pt accompanied by: significant other [name] [name])  PERTINENT HISTORY: Pt is an 82 y.o. male who presented 09/16/22 with AMS. CT showed potentially acute or subacute infarct in the right frontal lobe. PMH: mild cognitive impairment, ischemic cardiomyopathy and coronary artery disease s/p CABG x 3 (2015), pulmonary hypertension, CHF, left bundle branch block, prediabetes, HLD, HTN, mixed central and OSA intolerant of CPAP, COPD, hypothyroidism, lumbar disc disease s/p fusion, polycythemia vera (JAK2 positive on hydroxyurea). MD notes mentioned dementia    PAIN: Are you having pain? No  FALLS: Has patient fallen in last 6 months?  No, See PT evaluation for details  PATIENT GOALS: improve memory  OBJECTIVE:   PATIENT REPORTED OUTCOME MEASURES (PROM): Not completed d/t time constraints   TODAY'S TREATMENT:                                                                                                                                         11/04/22: [name] [name] stated pt indicated that he did not like [name] [name] asking him questions about events and after a time refused to answer any more questions about events of the day and thus she did not do any journal anymore. She was largely unsuccessful with pt recall of any events earlier in the day. Upon question by SLP, pt's reason for not continuing with journal was "we were out of town" which [name] [name] says they were not. [name] [name] reports Elijah Birk has had hallucinations about being in his daughter's home and  not their home and then rationalizes his errors by saying there must be two homes which look identical -one their daughter lives in and the one they live in. There is also some evidence recently, [name] [name] states, that pt is joining two completely unrelated items together as one thought, as well as other confabulatory statements (e.g., pt's car was taken from them on the highway). She provided 3-4 more examples  of this, which sounded more like sxs of dementia than results of a CVA. Pt has done coffee correctly the last 3 mornings. SLP suggested pt put alarm in his phone for 5:15 pm meds instead of [name]'s phone. "Oh that sounds like a good idea, pt stated."  10/23/22: Given pt's "s" statement and wife's desire to have pt recall events, SLP suggested a "memory book" with a journal section. SLP explained this to pt and wife and how pt could utilize this book so that wife's questions are therapeutic instead of what pt called a "test". After looking at great grandson's picture, saying his name, and repeating great grandson's name once to SLP, [name] [name] asked pt the name 4 seconds later and pt could not recall. SLP strongly suggested pt have pictures of family as a section in his memory book, and [name] [name] write in the names of members for pt to rehearse daily. SLP explained written cues are helpful for pts' recall, after [name] [name] stated pt needs to do it on his own and should not use written cues. Wife described a intermittent difficulty with the coffee machine (pod), and SLP suggested she and pt go through a set of directions she will write down  for pt. SLP reiterated about written cues are helpful, as above.  10/08/22: Educated pt and wife re: how to improve retrieval of family names. To improve pt memory and independence with schedule management, suggested to pt and wife to review next day's appointments each day in the afternoon, write into the schedule for the next day, and then plan anything else they want to do tomorrow at that  time, and write into the schedule. Pt can have schedule reinforced in the morning with coffee. [name] [name] said pt forgot why they were in the car traveling here despite her telling him shortly before he demonstrated he had forgotten, so SLP suggested sticky note on dash to assist pt in recall of what medical professional they are traveling to see.  [name] [name] shared an errand to the farmers market occurring after this appointment. Patient recalled, given extra time, that they were going to the farmers market, but did not recall why. SLP engaged in spaced retrieval with pt for the reason. After 30 second interval  patient again required cues for the reason going to the farmers market. To foster recall, SLP had pt write down [employer], and "pink flowers" and due to time, SLP wrote on sticky note for [name] [name] to put on dash. SLP told [name] [name], ideally, would have wanted pt to have written on the sticky note and encouraged her to do the same at home. Reiterated the need for routine for memory improvement via procedural memory.  10/06/22: Pt wife [name] [name] stated that pt does not need to learn how to use the remote, and only showers twice a week. She had a long explanation why she would like him to learn how to pay bills again.  Although notes from 10/02/22 state wife was educated about exacerbation of dementia in cognitive communication post CVA, SLP reiterated this to wife today again for sake of clarity. SLP also explained/educated pt and wife that ST will work with pt and wife to have pt regain as much function as possible after CVA but that dx of dementia decreases clarity of pt's overall prognosis in ST. Today, pt answered "I don't remember" of "I don't recall" to 7/8 questions asked of him today and looked to wife to ansewr for him. Wife told SLP she found pt standing  wet out of shower over the weekend with towel on floor and had to cue pt to dry off and begin after shower routine. SLP assisted wife/pt making a checklist and  encouraged wife to place checklist in bathroom. SLP educated wife she may need to assist pt with routine for the first 3-5 times to foster recall of routine. Lastly, wife very concerned about bill paying and pt's ability to regain that skill. SLP told wife to sit with pt to see what is recalled from rote memory. SLP will assist in augmenting with written cues and teach wife how to cue pt for this task next session.  SLP questions pt's need for formal cognitive testing (CLQT) due to severity of deficits.  Pt may benefit more at a later date.   10/02/22: Educated wife on typical exacerbation of dementia cognitive deficits secondary to stroke. Educated pt and wife on plan to address functional goals, such as making coffee, working remote, recalling family names, etc to optimize life participation and reduce caregiver burden. Both verbalized understanding and agreement.   PATIENT EDUCATION: Education details: see above  Person educated: Patient and Spouse Education method: Medical illustrator Education comprehension: verbalized understanding, returned demonstration, and needs further education   GOALS: Goals reviewed with patient? Yes  SHORT TERM GOALS: Target date: 10/30/2022  Pt will sequence steps to operate coffee maker given external supports and spaced retrieval for 2/2 opportunities with occasional max A  Baseline: Goal status: MET  2.  Pt will operate simple functions of television remote (power, volume, channel) with cognitive supports for 2/2 opportunities with occasional max A  Baseline: 11/04/22 Goal status: IN PROGRESS  3.  Pt will effectively use memory aid to name pertinent family members for 2/2 opportunities given occasional max A Baseline:  Goal status: IN PROGRESS  4.  Wife will carryover trained techniques to maximize patient's participation in conversation and home tasks given occasional max A  Baseline:  Goal status: IN PROGRESS   LONG TERM GOALS: Target  date: 11/27/2022  Pt will ID personal information in memory book (address, family members, medical providers) with occasional mod A from wife > 1 week Baseline:  Goal status: IN PROGRESS  2.  Pt will be able to carryover targeted functional routines (making coffee, operating remote) with occasional mod A from wife > 1 week Baseline:  Goal status: IN PROGRESS  3.  Wife will appropriately ID and implement necessary memory supports at home to aid cognitive functioning for 2/2 opportunities  Baseline:  Goal status: IN PROGRESS   ASSESSMENT:  CLINICAL IMPRESSION: Patient is a 82 y.o. M who was seen today for cognitive changes s/p recent CVA. PMHX significant for early onset dementia. Prior to stroke, pt was making his own coffee, managing his medications with personalized system and occasional cues from wife, and attending MD appointments alone. Some reduced facial recognition of family members indicated prior to stroke. Since stroke, pt experiencing increased significant difficulty operating coffee makers x2 (pods and pot), using television remote, recalling family members names, and recalling completion of tasks (I.e., washing hair multiple times). Pt and wife would benefit from skilled ST intervention to optimize understanding of cognitive challenges related to dementia and stroke and to carryover learned techniques to aid patient life participation. See "today's treatment" for more details. SLP to consider decr to x1/week due to question pt's cognitive s/sx more of demential than of CVA.  OBJECTIVE IMPAIRMENTS: include attention, memory, awareness, and executive functioning. These impairments are limiting patient from  managing medications, household responsibilities, and ADLs/IADLs. Factors affecting potential to achieve goals and functional outcome are ability to learn/carryover information, co-morbidities, previous level of function, and severity of impairments.. Patient will benefit from  skilled SLP services to address above impairments and improve overall function.  REHAB POTENTIAL: Good  PLAN:  SLP FREQUENCY: 2x/week  SLP DURATION: 8 weeks  PLANNED INTERVENTIONS: Environmental controls, Cueing hierachy, Cognitive reorganization, Internal/external aids, Functional tasks, SLP instruction and feedback, Compensatory strategies, and Patient/family education    [facility], CCC-SLP 11/04/2022, 1:24 PM

## 2022-11-05 LAB — B12 AND FOLATE PANEL
Folate: >20 ng/mL (ref >3.0)
Vitamin B-12: >2000 pg/mL — ABNORMAL HIGH (ref 232–1245)

## 2022-11-05 LAB — ATN PROFILE
Beta-amyloid 42: 32.11 pg/mL
T -- p-tau181: 1.9 pg/mL — ABNORMAL HIGH (ref 0.00–0.97)

## 2022-11-05 LAB — APOE ALZHEIMER'S RISK

## 2022-11-05 IMAGING — RF DG ESOPHAGUS
14 of 23 series · 14 of 24 positions shown · non-contrast
Comparison: NONE.

CLINICAL DATA: Dysphagia with sensation of food getting stuck in
the throat. History of esophageal dilation approximately 10 years
ago.

EXAM:
ESOPHAGUS/BARIUM SWALLOW/TABLET STUDY
TECHNIQUE: Combined double and single contrast examination was performed using
effervescent crystals, high-density barium, and thin liquid barium.
This exam was performed by Sisay, Annemarie, and was supervised and
interpreted by Dr. Atahar Tani.
FLUOROSCOPY:
Radiation Exposure Index (as provided by the fluoroscopic device):
14.6 mGy Kerma

[Series 1: cp_standard · 1 of 6 frames shown (1 of 12)]
[frame 1/6]
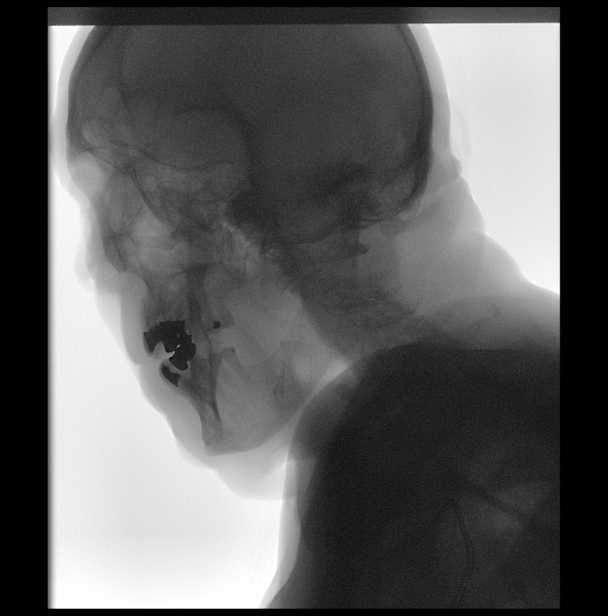

[Series 3: cp_standard · 1 of 4 frames shown (2 of 12)]
[frame 3/4]
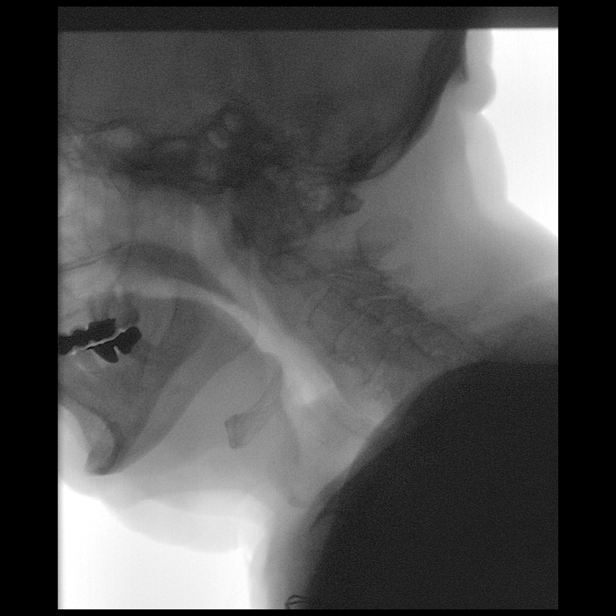

[Series 5: cp_standard · 1 of 6 frames shown (3 of 12)]
[frame 4/6]
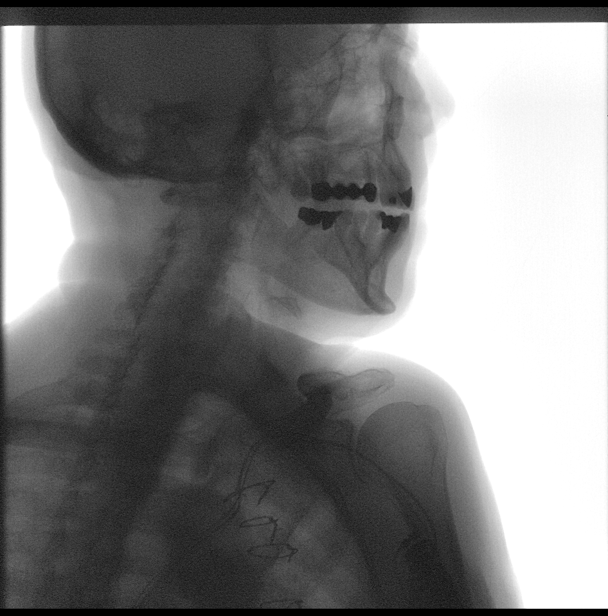

[Series 7: fluoro_barium 2fps_bw · 1 of 6 frames shown (1 of 2)]
[frame 2/6]
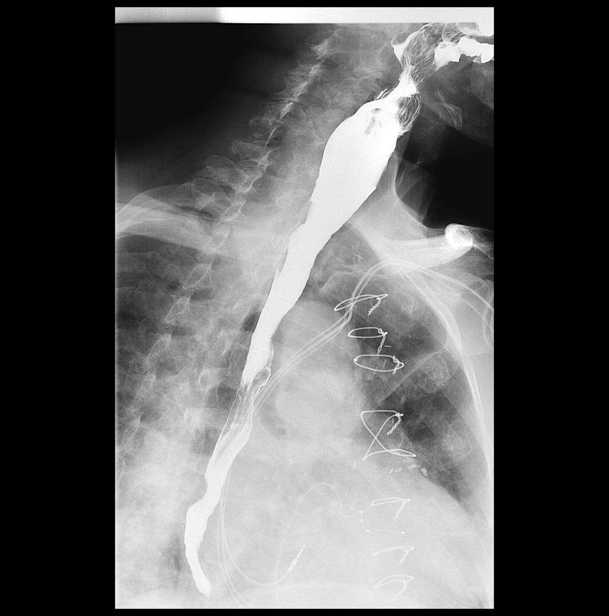

[Series 8: fluoro_barium 2fps_bw · 1 of 2 frames shown (2 of 2)]
[frame 2/2]
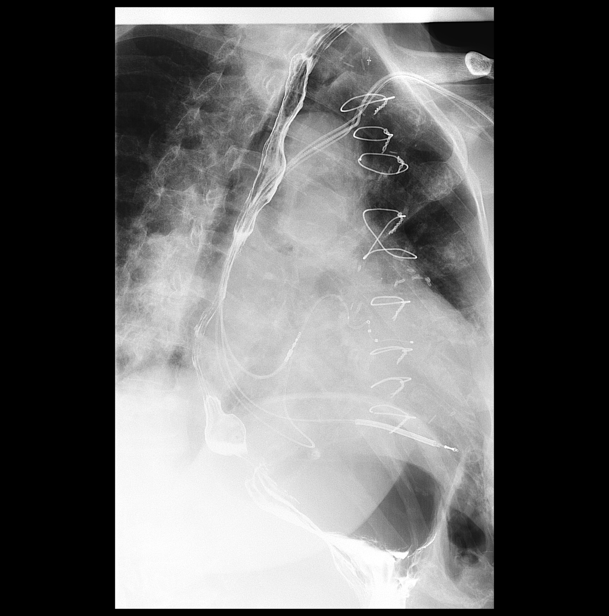

[Series 10: cp_standard · 1 of 12 frames shown (4 of 12)]
[frame 11/12]
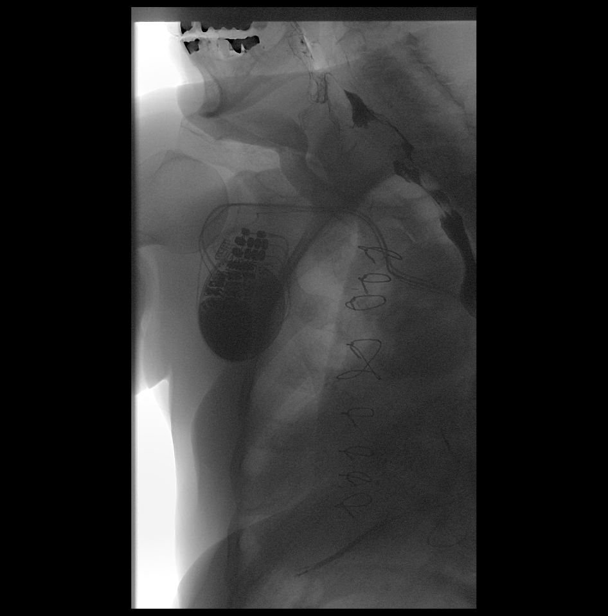

[Series 12: cp_standard · 1 of 7 frames shown (5 of 12)]
[frame 6/7]
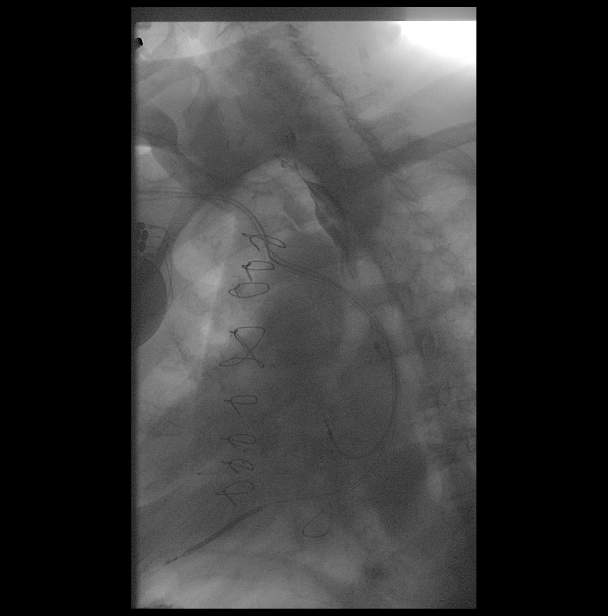

[Series 13: cp_standard · 1 of 10 frames shown (6 of 12)]
[frame 9/10]
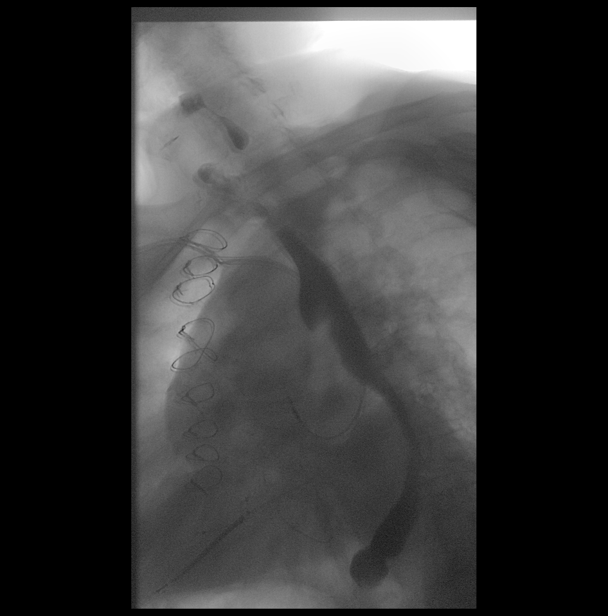

[Series 16: cp_standard · 1 of 4 frames shown (7 of 12)]
[frame 1/4]
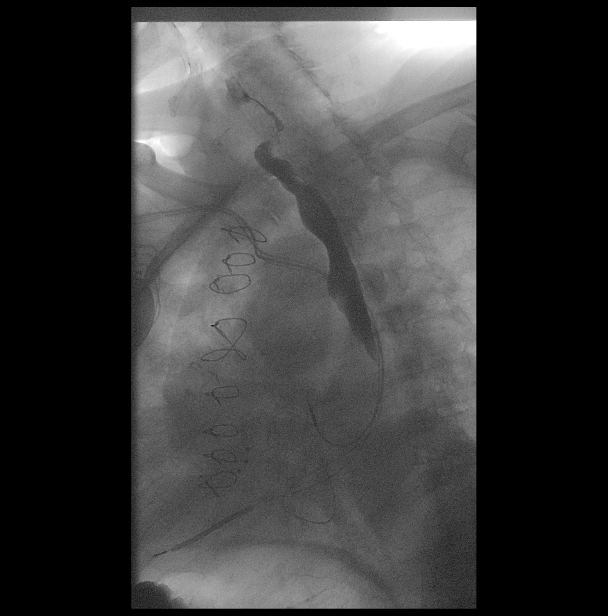

[Series 18: cp_standard · 1 of 230 frames shown (8 of 12)]
[frame 115/230]
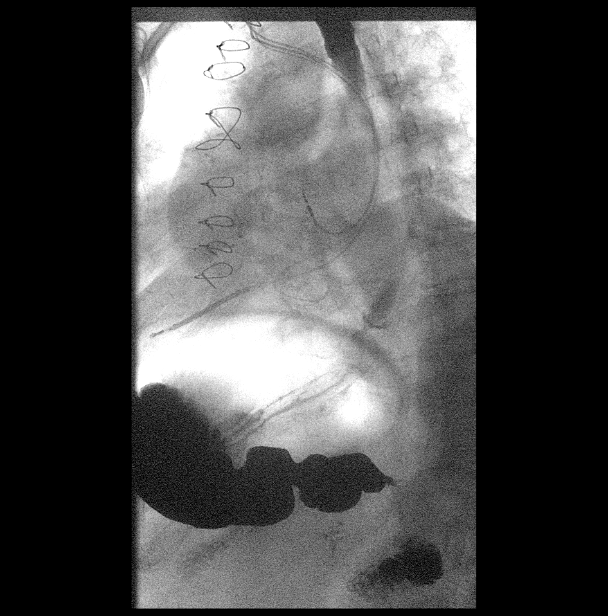

[Series 20: cp_standard · 1 of 7 frames shown (9 of 12)]
[frame 4/7]
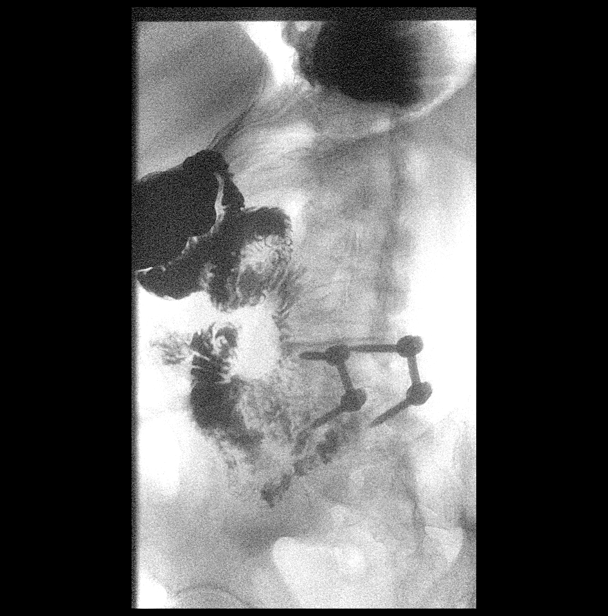

[Series 21: cp_standard · 1 of 8 frames shown (10 of 12)]
[frame 2/8]
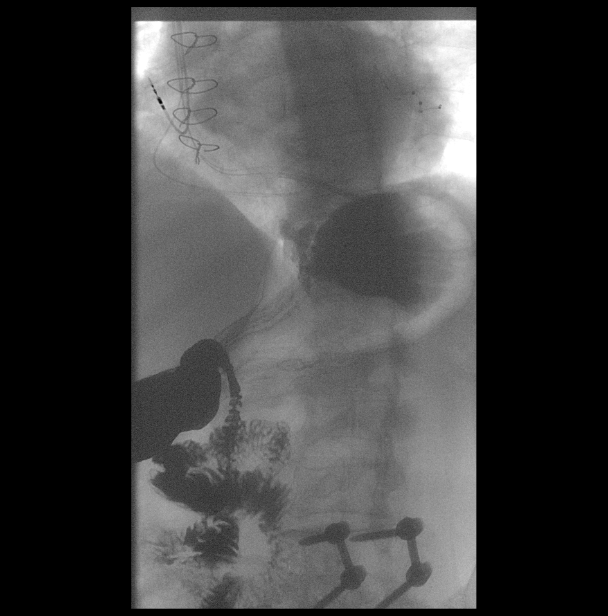

[Series 23: cp_standard · 1 of 102 frames shown (11 of 12)]
[frame 16/102]
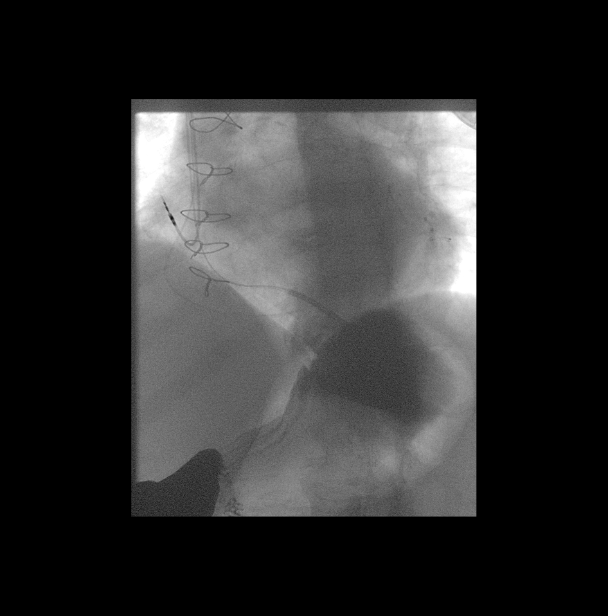

[Series 24: cp_standard · 1 of 39 frames shown (12 of 12)]
[frame 34/39]
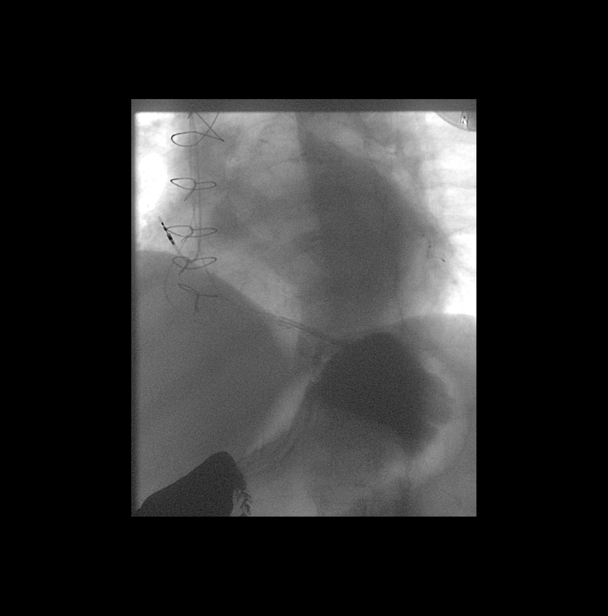

[14 of 24 positions shown; findings below may reference images not displayed]

FINDINGS: Swallowing: Appears normal. No vestibular penetration or aspiration
seen.

Pharynx: Unremarkable.

Esophagus: Jim diverticulum visualized

Esophageal motility: Multiple non propulsive, disorganized tertiary
waves identified resulting in proximal escape as well as to and fro
stasis of enteric contrast material within the esophagus.

Hiatal Hernia: None.

Gastroesophageal reflux: None visualized.

Ingested 13mm barium tablet: Passed normally

Other: None.
IMPRESSION: 1. Moderate esophageal dysmotility.
2. Jim diverticulum.

## 2022-11-06 ENCOUNTER — Ambulatory Visit: Payer: Medicare Other

## 2022-11-06 DIAGNOSIS — R41841 Cognitive communication deficit: Secondary | ICD-10-CM | POA: Diagnosis not present

## 2022-11-06 NOTE — Therapy (Signed)
OUTPATIENT SPEECH LANGUAGE PATHOLOGY TREATMENT   Patient Name: [name] [name] MRN: [id number] DOB:1941-05-07, 82 y.o., male Today's Date: 11/06/2022  PCP: Suzan Slick, MD  REFERRING PROVIDER: Suzan Slick, MD   END OF SESSION:  End of Session - 11/06/22 1658     Visit Number 6    Number of Visits 17    Date for SLP Re-Evaluation 11/27/22    SLP Start Time 1450    SLP Stop Time  1530    SLP Time Calculation (min) 40 min    Activity Tolerance Patient tolerated treatment well               Past Medical History:  Diagnosis Date   A-fib (HCC) 01/2014   AICD (automatic cardioverter/defibrillator) present    [employer]   Anal fissure 11/10/2019   Arthritis    ASHD (arteriosclerotic heart disease) 2015   s/p CABG   Cataract    s/p left   CHF (congestive heart failure) (HCC)    COPD (chronic obstructive pulmonary disease) (HCC)    by CXR, pt unsure of this   Diverticulosis    GERD (gastroesophageal reflux disease)    Gout 2014   Hyperlipidemia    Hypertension    Hypothyroidism    LBBB (left bundle branch block)    Polycythemia vera (HCC)    Prediabetes 01/2014   Presence of permanent cardiac pacemaker    Sleep apnea    no CPAP   Past Surgical History:  Procedure Laterality Date   CARDIAC DEFIBRILLATOR PLACEMENT  07/2014   CATARACT EXTRACTION W/ INTRAOCULAR LENS IMPLANT Left    COLONOSCOPY  08/26/2022   normal   CORONARY ARTERY BYPASS GRAFT  11/2013   ESOPHAGOGASTRODUODENOSCOPY  08/26/2022   esophageal dilation   lumb laminectomy  4098,1191,4782   x 3   LUMBAR FUSION  2013   NASAL SEPTOPLASTY W/ TURBINOPLASTY Bilateral 01/30/2016   Procedure: NASAL SEPTOPLASTY WITH BILATERAL TURBINATE REDUCTION;  Surgeon: Drema Halon, MD;  Location: [facility] OR;  Service: ENT;  Laterality: Bilateral;   TONSILLECTOMY     UVULECTOMY N/A 01/30/2016   Procedure: UVULECTOMY;  Surgeon: Drema Halon, MD;  Location: [facility] OR;  Service: ENT;  Laterality:  N/A;   Patient Active Problem List   Diagnosis Date Noted   Acute CVA (cerebrovascular accident) (HCC) 09/16/2022   Chronic diastolic CHF (congestive heart failure) (HCC) 09/16/2022   Encounter for screening colonoscopy 08/26/2022   Chronic ulcer of left foot with fat layer exposed (HCC) 07/10/2022   Zenker's diverticulum 10/16/2021   Loss of appetite 10/10/2021   Loss of weight 10/10/2021   Generalized abdominal pain 10/10/2021   Thrombosed external hemorrhoid 10/10/2021   Dysphagia 08/01/2021   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   Intolerance of continuous positive airway pressure (CPAP) ventilation 05/02/2021   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   JAK2 V617F mutation 08/27/2020   Rectal pain 11/10/2019   Constipation 11/10/2019   Abnormal glucose 10/12/2019   Memory changes 01/10/2019   Polycythemia vera (HCC) 10/21/2018   Thrombocytosis 12/29/2017   Senile purpura (HCC) 09/01/2017   Emphysema of lung (HCC) 06/01/2017   Tortuous aorta (HCC)- CXR 2018 06/01/2017   Regular astigmatism, right eye 10/16/2016   Combined forms of age-related cataract of right eye 10/16/2016   Trigger ring finger of left hand 08/06/2016   Bilateral carpal tunnel syndrome 08/06/2016   Ischemic cardiomyopathy 06/22/2015   ASHD/CABG x 3V (11/2013) 02/06/2015   Acquired thrombophilia (HCC) 02/06/2015  Cardiac pacemaker/AICD (01/2014) 02/06/2015   Morbid obesity (HCC) 01/03/2015   HTN (hypertension) 01/02/2015   Hyperlipidemia, mixed 01/02/2015   Vitamin D deficiency 01/02/2015   GERD  01/02/2015   BPH (benign prostatic hyperplasia) 01/02/2015   Medication management 01/02/2015   Atrial fibrillation (HCC) 01/02/2015   Hypothyroidism 01/02/2015   OSA and COPD overlap syndrome (HCC) 01/02/2015   Chronic systolic (congestive) heart failure (HCC) 02/17/2014   S/P CABG x 3 02/13/2014   Atherosclerotic heart disease of native coronary artery without angina pectoris 02/08/2014    Gout 02/08/2014   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) 02/06/2014    ONSET DATE: 09/16/22   REFERRING DIAG:  I63.9 (ICD-10-CM) - Acute CVA (cerebrovascular accident) (HCC)  I69.319 (ICD-10-CM) - Cognitive deficit due to recent stroke   THERAPY DIAG: No diagnosis found.  Rationale for Evaluation and Treatment: Rehabilitation  SUBJECTIVE:   SUBJECTIVE STATEMENT: "You know life is a funny thing. Some things go well and others don't go so well."  Pt accompanied by: significant other [name])  PERTINENT HISTORY: Pt is an 82 y.o. male who presented 09/16/22 with AMS. CT showed potentially acute or subacute infarct in the right frontal lobe. PMH: mild cognitive impairment, ischemic cardiomyopathy and coronary artery disease s/p CABG x 3 (2015), pulmonary hypertension, CHF, left bundle branch block, prediabetes, HLD, HTN, mixed central and OSA intolerant of CPAP, COPD, hypothyroidism, lumbar disc disease s/p fusion, polycythemia vera (JAK2 positive on hydroxyurea). MD notes mentioned dementia    PAIN: Are you having pain? No  FALLS: Has patient fallen in last 6 months?  No, See PT evaluation for details  PATIENT GOALS: improve memory  OBJECTIVE:   PATIENT REPORTED OUTCOME MEASURES (PROM): Not completed d/t time constraints   TODAY'S TREATMENT:                                                                                                                                         11/06/22: Pt asked how it was going with alarm in pt's phone for 5:15 meds. The alarm was not completed. Therefore, SLP assisted pt in making alarms for 5 medication times between 5:15 and 11:00. Pt req'd consistent max A initially, faded to usual max A by the 5th alarm. Pt did not recall any medication times even with max A. At times, pt req'd max A 15 seconds after initial total A. Pt was more socially appropriate today, greeting SLP and getting up from his chair with min A from wife as SLP greeted  him in lobby.  11/04/22: [name] stated pt indicated that he did not like [name] asking him questions about events and after a time refused to answer any more questions about events of the day and thus she did not do any journal anymore. She was largely unsuccessful with pt recall of any events earlier in the day. Upon question by SLP, pt's reason for  not continuing with journal was "we were out of town" which [name] says they were not. [name] reports Elijah Birk has had hallucinations about being in his daughter's home and not their home and then rationalizes his errors by saying there must be two homes which look identical -one their daughter lives in and the one they live in. There is also some evidence recently, [name] states, that pt is joining two completely unrelated items together as one thought, as well as other confabulatory statements (e.g., pt's car was taken from them on the highway). She provided 3-4 more examples of this, which sounded more like sxs of dementia than results of a CVA. Pt has done coffee correctly the last 3 mornings. SLP suggested pt put alarm in his phone for 5:15 pm meds instead of [name]'s phone. "Oh that sounds like a good idea, pt stated."  10/23/22: Given pt's "s" statement and wife's desire to have pt recall events, SLP suggested a "memory book" with a journal section. SLP explained this to pt and wife and how pt could utilize this book so that wife's questions are therapeutic instead of what pt called a "test". After looking at great grandson's picture, saying his name, and repeating great grandson's name once to SLP, [name] asked pt the name 4 seconds later and pt could not recall. SLP strongly suggested pt have pictures of family as a section in his memory book, and [name] write in the names of members for pt to rehearse daily. SLP explained written cues are helpful for pts' recall, after [name] stated pt needs to do it on his own and should not use written cues. Wife described a intermittent  difficulty with the coffee machine (pod), and SLP suggested she and pt go through a set of directions she will write down  for pt. SLP reiterated about written cues are helpful, as above.  10/08/22: Educated pt and wife re: how to improve retrieval of family names. To improve pt memory and independence with schedule management, suggested to pt and wife to review next day's appointments each day in the afternoon, write into the schedule for the next day, and then plan anything else they want to do tomorrow at that time, and write into the schedule. Pt can have schedule reinforced in the morning with coffee. [name] said pt forgot why they were in the car traveling here despite her telling him shortly before he demonstrated he had forgotten, so SLP suggested sticky note on dash to assist pt in recall of what medical professional they are traveling to see.  [name] shared an errand to the farmers market occurring after this appointment. Patient recalled, given extra time, that they were going to the farmers market, but did not recall why. SLP engaged in spaced retrieval with pt for the reason. After 30 second interval  patient again required cues for the reason going to the farmers market. To foster recall, SLP had pt write down [employer], and "pink flowers" and due to time, SLP wrote on sticky note for [name] to put on dash. SLP told [name], ideally, would have wanted pt to have written on the sticky note and encouraged her to do the same at home. Reiterated the need for routine for memory improvement via procedural memory.  10/06/22: Pt wife [name] stated that pt does not need to learn how to use the remote, and only showers twice a week. She had a long explanation why she would like him to learn how to pay bills again.  Although notes from 10/02/22 state wife was educated about exacerbation of dementia in cognitive communication post CVA, SLP reiterated this to wife today again for sake of clarity. SLP also  explained/educated pt and wife that ST will work with pt and wife to have pt regain as much function as possible after CVA but that dx of dementia decreases clarity of pt's overall prognosis in ST. Today, pt answered "I don't remember" of "I don't recall" to 7/8 questions asked of him today and looked to wife to ansewr for him. Wife told SLP she found pt standing wet out of shower over the weekend with towel on floor and had to cue pt to dry off and begin after shower routine. SLP assisted wife/pt making a checklist and encouraged wife to place checklist in bathroom. SLP educated wife she may need to assist pt with routine for the first 3-5 times to foster recall of routine. Lastly, wife very concerned about bill paying and pt's ability to regain that skill. SLP told wife to sit with pt to see what is recalled from rote memory. SLP will assist in augmenting with written cues and teach wife how to cue pt for this task next session.  SLP questions pt's need for formal cognitive testing (CLQT) due to severity of deficits.  Pt may benefit more at a later date.   10/02/22: Educated wife on typical exacerbation of dementia cognitive deficits secondary to stroke. Educated pt and wife on plan to address functional goals, such as making coffee, working remote, recalling family names, etc to optimize life participation and reduce caregiver burden. Both verbalized understanding and agreement.   PATIENT EDUCATION: Education details: see above  Person educated: Patient and Spouse Education method: Medical illustrator Education comprehension: verbalized understanding, returned demonstration, and needs further education   GOALS: Goals reviewed with patient? Yes  SHORT TERM GOALS: Target date: 10/30/2022  Pt will sequence steps to operate coffee maker given external supports and spaced retrieval for 2/2 opportunities with occasional max A  Baseline: Goal status: MET  2.  Pt will operate simple  functions of television remote (power, volume, channel) with cognitive supports for 2/2 opportunities with occasional max A  Baseline: 11/04/22 Goal status: Met  3.  Pt will effectively use memory aid to name pertinent family members for 2/2 opportunities given occasional max A Baseline:  Goal status: IN PROGRESS  4.  Wife will carryover trained techniques to maximize patient's participation in conversation and home tasks given occasional max A  Baseline:  Goal status: IN PROGRESS   LONG TERM GOALS: Target date: 11/27/2022  Pt will ID personal information in memory book (address, family members, medical providers) with occasional mod A from wife > 1 week Baseline:  Goal status: IN PROGRESS  2.  Pt will be able to carryover targeted functional routines (making coffee, operating remote) with occasional mod A from wife > 1 week Baseline:  Goal status: IN PROGRESS  3.  Wife will appropriately ID and implement necessary memory supports at home to aid cognitive functioning for 2/2 opportunities  Baseline:  Goal status: IN PROGRESS   ASSESSMENT:  CLINICAL IMPRESSION: Patient is a 82 y.o. M who was seen today for cognitive changes s/p recent CVA. PMHX significant for early onset dementia. Prior to stroke, pt was making his own coffee, managing his medications with personalized system and occasional cues from wife, and attending MD appointments alone. Some reduced facial recognition of family members indicated prior to stroke. Since stroke, pt experiencing  increased significant difficulty operating coffee makers x2 (pods and pot), using television remote, recalling family members names, and recalling completion of tasks (I.e., washing hair multiple times). Pt and wife would benefit from skilled ST intervention to optimize understanding of cognitive challenges related to dementia and stroke and to carryover learned techniques to aid patient life participation. See "today's treatment" for more  details. SLP with ongoing consideration to decr to x1/week due to ? pt's cognitive s/sx more of demential than of CVA.  OBJECTIVE IMPAIRMENTS: include attention, memory, awareness, and executive functioning. These impairments are limiting patient from managing medications, household responsibilities, and ADLs/IADLs. Factors affecting potential to achieve goals and functional outcome are ability to learn/carryover information, co-morbidities, previous level of function, and severity of impairments.. Patient will benefit from skilled SLP services to address above impairments and improve overall function.  REHAB POTENTIAL: Good  PLAN:  SLP FREQUENCY: 2x/week  SLP DURATION: 8 weeks  PLANNED INTERVENTIONS: Environmental controls, Cueing hierachy, Cognitive reorganization, Internal/external aids, Functional tasks, SLP instruction and feedback, Compensatory strategies, and Patient/family education    [facility], CCC-SLP 11/06/2022, 5:01 PM

## 2022-11-11 ENCOUNTER — Ambulatory Visit (INDEPENDENT_AMBULATORY_CARE_PROVIDER_SITE_OTHER): Payer: Medicare Other | Admitting: Neurology

## 2022-11-11 DIAGNOSIS — R4182 Altered mental status, unspecified: Secondary | ICD-10-CM

## 2022-11-11 DIAGNOSIS — E538 Deficiency of other specified B group vitamins: Secondary | ICD-10-CM

## 2022-11-11 DIAGNOSIS — E519 Thiamine deficiency, unspecified: Secondary | ICD-10-CM

## 2022-11-11 DIAGNOSIS — G301 Alzheimer's disease with late onset: Secondary | ICD-10-CM

## 2022-11-11 DIAGNOSIS — E559 Vitamin D deficiency, unspecified: Secondary | ICD-10-CM

## 2022-11-11 DIAGNOSIS — I639 Cerebral infarction, unspecified: Secondary | ICD-10-CM

## 2022-11-11 DIAGNOSIS — N390 Urinary tract infection, site not specified: Secondary | ICD-10-CM

## 2022-11-12 ENCOUNTER — Encounter (HOSPITAL_COMMUNITY)
Admission: RE | Admit: 2022-11-12 | Discharge: 2022-11-12 | Disposition: A | Discharge status: Home / Self Care | Payer: Medicare Other | Source: Ambulatory Visit | Attending: Neurology | Admitting: Neurology

## 2022-11-12 DIAGNOSIS — E519 Thiamine deficiency, unspecified: Secondary | ICD-10-CM | POA: Insufficient documentation

## 2022-11-12 DIAGNOSIS — E538 Deficiency of other specified B group vitamins: Secondary | ICD-10-CM | POA: Insufficient documentation

## 2022-11-12 DIAGNOSIS — E559 Vitamin D deficiency, unspecified: Secondary | ICD-10-CM | POA: Diagnosis not present

## 2022-11-12 DIAGNOSIS — G301 Alzheimer's disease with late onset: Secondary | ICD-10-CM | POA: Diagnosis not present

## 2022-11-12 DIAGNOSIS — I639 Cerebral infarction, unspecified: Secondary | ICD-10-CM | POA: Insufficient documentation

## 2022-11-12 DIAGNOSIS — R413 Other amnesia: Secondary | ICD-10-CM | POA: Diagnosis not present

## 2022-11-12 DIAGNOSIS — F02B0 Dementia in other diseases classified elsewhere, moderate, without behavioral disturbance, psychotic disturbance, mood disturbance, and anxiety: Secondary | ICD-10-CM | POA: Diagnosis not present

## 2022-11-12 DIAGNOSIS — N390 Urinary tract infection, site not specified: Secondary | ICD-10-CM | POA: Diagnosis not present

## 2022-11-12 MED ORDER — FLORBETAPIR F 18 500-1900 MBQ/ML IV SOLN
10.0000 | Freq: Once | INTRAVENOUS | Status: AC
  Administered 2022-11-12: 9.22 via INTRAVENOUS

## 2022-11-17 ENCOUNTER — Ambulatory Visit: Payer: Medicare Other | Attending: Family Medicine

## 2022-11-17 DIAGNOSIS — R41841 Cognitive communication deficit: Secondary | ICD-10-CM | POA: Insufficient documentation

## 2022-11-17 NOTE — Therapy (Signed)
OUTPATIENT SPEECH LANGUAGE PATHOLOGY TREATMENT   Patient Name: [name] [name] MRN: [id number] DOB:Dec 29, 1940, 82 y.o., male Today's Date: 11/17/2022  PCP: Suzan Slick, MD  REFERRING PROVIDER: Suzan Slick, MD   END OF SESSION:  End of Session - 11/17/22 1528     Visit Number 7    Number of Visits 17    Date for SLP Re-Evaluation 11/27/22    SLP Start Time 1319    SLP Stop Time  1400    SLP Time Calculation (min) 41 min    Activity Tolerance Patient tolerated treatment well                Past Medical History:  Diagnosis Date   A-fib (HCC) 01/2014   AICD (automatic cardioverter/defibrillator) present    [employer]   Anal fissure 11/10/2019   Arthritis    ASHD (arteriosclerotic heart disease) 2015   s/p CABG   Cataract    s/p left   CHF (congestive heart failure) (HCC)    COPD (chronic obstructive pulmonary disease) (HCC)    by CXR, pt unsure of this   Diverticulosis    GERD (gastroesophageal reflux disease)    Gout 2014   Hyperlipidemia    Hypertension    Hypothyroidism    LBBB (left bundle branch block)    Polycythemia vera (HCC)    Prediabetes 01/2014   Presence of permanent cardiac pacemaker    Sleep apnea    no CPAP   Past Surgical History:  Procedure Laterality Date   CARDIAC DEFIBRILLATOR PLACEMENT  07/2014   CATARACT EXTRACTION W/ INTRAOCULAR LENS IMPLANT Left    COLONOSCOPY  08/26/2022   normal   CORONARY ARTERY BYPASS GRAFT  11/2013   ESOPHAGOGASTRODUODENOSCOPY  08/26/2022   esophageal dilation   lumb laminectomy  [id number]   x 3   LUMBAR FUSION  2013   NASAL SEPTOPLASTY W/ TURBINOPLASTY Bilateral 01/30/2016   Procedure: NASAL SEPTOPLASTY WITH BILATERAL TURBINATE REDUCTION;  Surgeon: Drema Halon, MD;  Location: [facility] OR;  Service: ENT;  Laterality: Bilateral;   TONSILLECTOMY     UVULECTOMY N/A 01/30/2016   Procedure: UVULECTOMY;  Surgeon: Drema Halon, MD;  Location: [facility] OR;  Service: ENT;   Laterality: N/A;   Patient Active Problem List   Diagnosis Date Noted   Acute CVA (cerebrovascular accident) (HCC) 09/16/2022   Chronic diastolic CHF (congestive heart failure) (HCC) 09/16/2022   Encounter for screening colonoscopy 08/26/2022   Chronic ulcer of left foot with fat layer exposed (HCC) 07/10/2022   Zenker's diverticulum 10/16/2021   Loss of appetite 10/10/2021   Loss of weight 10/10/2021   Generalized abdominal pain 10/10/2021   Thrombosed external hemorrhoid 10/10/2021   Dysphagia 08/01/2021   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   Intolerance of continuous positive airway pressure (CPAP) ventilation 05/02/2021   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   JAK2 V617F mutation 08/27/2020   Rectal pain 11/10/2019   Constipation 11/10/2019   Abnormal glucose 10/12/2019   Memory changes 01/10/2019   Polycythemia vera (HCC) 10/21/2018   Thrombocytosis 12/29/2017   Senile purpura (HCC) 09/01/2017   Emphysema of lung (HCC) 06/01/2017   Tortuous aorta (HCC)- CXR 2018 06/01/2017   Regular astigmatism, right eye 10/16/2016   Combined forms of age-related cataract of right eye 10/16/2016   Trigger ring finger of left hand 08/06/2016   Bilateral carpal tunnel syndrome 08/06/2016   Ischemic cardiomyopathy 06/22/2015   ASHD/CABG x 3V (11/2013) 02/06/2015   Acquired thrombophilia (HCC) 02/06/2015  Cardiac pacemaker/AICD (01/2014) 02/06/2015   Morbid obesity (HCC) 01/03/2015   HTN (hypertension) 01/02/2015   Hyperlipidemia, mixed 01/02/2015   Vitamin D deficiency 01/02/2015   GERD  01/02/2015   BPH (benign prostatic hyperplasia) 01/02/2015   Medication management 01/02/2015   Atrial fibrillation (HCC) 01/02/2015   Hypothyroidism 01/02/2015   OSA and COPD overlap syndrome (HCC) 01/02/2015   Chronic systolic (congestive) heart failure (HCC) 02/17/2014   S/P CABG x 3 02/13/2014   Atherosclerotic heart disease of native coronary artery without angina pectoris  02/08/2014   Gout 02/08/2014   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) 02/06/2014    ONSET DATE: 09/16/22   REFERRING DIAG:  I63.9 (ICD-10-CM) - Acute CVA (cerebrovascular accident) (HCC)  I69.319 (ICD-10-CM) - Cognitive deficit due to recent stroke   THERAPY DIAG: Cognitive communication deficit  Rationale for Evaluation and Treatment: Rehabilitation  SUBJECTIVE:   SUBJECTIVE STATEMENT: Pt had EEG and PET scan done last week to better understand his diagnosis. "Who is Lucia Gaskins?" [employer], 30 seconds after [name] explained who Dr. Lucia Gaskins is, initially)  Pt accompanied by: significant other [name])  PERTINENT HISTORY: Pt is an 82 y.o. male who presented 09/16/22 with AMS. CT showed potentially acute or subacute infarct in the right frontal lobe. PMH: mild cognitive impairment, ischemic cardiomyopathy and coronary artery disease s/p CABG x 3 (2015), pulmonary hypertension, CHF, left bundle branch block, prediabetes, HLD, HTN, mixed central and OSA intolerant of CPAP, COPD, hypothyroidism, lumbar disc disease s/p fusion, polycythemia vera (JAK2 positive on hydroxyurea). MD notes mentioned dementia    PAIN: Are you having pain? No  FALLS: Has patient fallen in last 6 months?  No, See PT evaluation for details  PATIENT GOALS: improve memory  OBJECTIVE:   PATIENT REPORTED OUTCOME MEASURES (PROM): Not completed d/t time constraints   TODAY'S TREATMENT:                                                                                                                                         11/17/22: [name] reports that alarms for medication times were more a nuisance for [name] than they were helpful. When alarms went off, the same scenario occurred; She cued [name] to look at the label for the alarm and described his reaction that he would, each time, hand the phone to her and she would hand the phone back and cue him the same way each time to read the label and then would have to cue him how to  turn off the alarm. According to [facility], the 50 alarms which went off (5/day x10 days) between last ST and today's ST, did not appear to improve [name]'s awareness that it was time to take a dose of his medicine, even when using the labels for the alarms. Additionally, SLP asked [name] what other things she had implemented; She has been reviewing pt's schedule in the afternoon for hte next  day, and again the current morning, using a sticky note in the car for pt to know/reminded of where they are going, and using pictures to recall family names. [name] states none of these strategies/techniques suggested by SLP have seemed to improve pt's memory for these areas.  SLP, pt, and wife discussed pt's course of therapy at this time and thought it best to put ST on hold until further consultation with pt's neurologist on pt's test results from last week.   11/06/22: Pt asked how it was going with alarm in pt's phone for 5:15 meds. The alarm was not completed. Therefore, SLP assisted pt in making alarms for 5 medication times between 5:15 and 11:00. Pt req'd consistent max A initially, faded to usual max A by the 5th alarm. Pt did not recall any medication times even with max A. At times, pt req'd max A 15 seconds after initial total A. Pt was more socially appropriate today, greeting SLP and getting up from his chair with min A from wife as SLP greeted him in lobby.  11/04/22: [name] stated pt indicated that he did not like [name] asking him questions about events and after a time refused to answer any more questions about events of the day and thus she did not do any journal anymore. She was largely unsuccessful with pt recall of any events earlier in the day. Upon question by SLP, pt's reason for not continuing with journal was "we were out of town" which [name] says they were not. [name] reports Elijah Birk has had hallucinations about being in his daughter's home and not their home and then rationalizes his errors by saying there must be  two homes which look identical -one their daughter lives in and the one they live in. There is also some evidence recently, [name] states, that pt is joining two completely unrelated items together as one thought, as well as other confabulatory statements (e.g., pt's car was taken from them on the highway). She provided 3-4 more examples of this, which sounded more like sxs of dementia than results of a CVA. Pt has done coffee correctly the last 3 mornings. SLP suggested pt put alarm in his phone for 5:15 pm meds instead of [name]'s phone. "Oh that sounds like a good idea, pt stated."  10/23/22: Given pt's "s" statement and wife's desire to have pt recall events, SLP suggested a "memory book" with a journal section. SLP explained this to pt and wife and how pt could utilize this book so that wife's questions are therapeutic instead of what pt called a "test". After looking at great grandson's picture, saying his name, and repeating great grandson's name once to SLP, [name] asked pt the name 4 seconds later and pt could not recall. SLP strongly suggested pt have pictures of family as a section in his memory book, and [name] write in the names of members for pt to rehearse daily. SLP explained written cues are helpful for pts' recall, after [name] stated pt needs to do it on his own and should not use written cues. Wife described a intermittent difficulty with the coffee machine (pod), and SLP suggested she and pt go through a set of directions she will write down  for pt. SLP reiterated about written cues are helpful, as above.  10/08/22: Educated pt and wife re: how to improve retrieval of family names. To improve pt memory and independence with schedule management, suggested to pt and wife to review next day's appointments each day in  the afternoon, write into the schedule for the next day, and then plan anything else they want to do tomorrow at that time, and write into the schedule. Pt can have schedule reinforced in  the morning with coffee. [name] said pt forgot why they were in the car traveling here despite her telling him shortly before he demonstrated he had forgotten, so SLP suggested sticky note on dash to assist pt in recall of what medical professional they are traveling to see.  [name] shared an errand to the farmers market occurring after this appointment. Patient recalled, given extra time, that they were going to the farmers market, but did not recall why. SLP engaged in spaced retrieval with pt for the reason. After 30 second interval  patient again required cues for the reason going to the farmers market. To foster recall, SLP had pt write down [employer], and "pink flowers" and due to time, SLP wrote on sticky note for [name] to put on dash. SLP told [name], ideally, would have wanted pt to have written on the sticky note and encouraged her to do the same at home. Reiterated the need for routine for memory improvement via procedural memory.  10/06/22: Pt wife [name] stated that pt does not need to learn how to use the remote, and only showers twice a week. She had a long explanation why she would like him to learn how to pay bills again.  Although notes from 10/02/22 state wife was educated about exacerbation of dementia in cognitive communication post CVA, SLP reiterated this to wife today again for sake of clarity. SLP also explained/educated pt and wife that ST will work with pt and wife to have pt regain as much function as possible after CVA but that dx of dementia decreases clarity of pt's overall prognosis in ST. Today, pt answered "I don't remember" of "I don't recall" to 7/8 questions asked of him today and looked to wife to ansewr for him. Wife told SLP she found pt standing wet out of shower over the weekend with towel on floor and had to cue pt to dry off and begin after shower routine. SLP assisted wife/pt making a checklist and encouraged wife to place checklist in bathroom. SLP educated wife she may  need to assist pt with routine for the first 3-5 times to foster recall of routine. Lastly, wife very concerned about bill paying and pt's ability to regain that skill. SLP told wife to sit with pt to see what is recalled from rote memory. SLP will assist in augmenting with written cues and teach wife how to cue pt for this task next session.  SLP questions pt's need for formal cognitive testing (CLQT) due to severity of deficits.  Pt may benefit more at a later date.   10/02/22: Educated wife on typical exacerbation of dementia cognitive deficits secondary to stroke. Educated pt and wife on plan to address functional goals, such as making coffee, working remote, recalling family names, etc to optimize life participation and reduce caregiver burden. Both verbalized understanding and agreement.   PATIENT EDUCATION: Education details: see above  Person educated: Patient and Spouse Education method: Medical illustrator Education comprehension: verbalized understanding, returned demonstration, and needs further education   GOALS: Goals reviewed with patient? Yes  SHORT TERM GOALS: Target date: 10/30/2022  Pt will sequence steps to operate coffee maker given external supports and spaced retrieval for 2/2 opportunities with occasional max A  Baseline: Goal status: MET  2.  Pt  will operate simple functions of television remote (power, volume, channel) with cognitive supports for 2/2 opportunities with occasional max A  Baseline: 11/04/22 Goal status: Met  3.  Pt will effectively use memory aid to name pertinent family members for 2/2 opportunities given occasional max A Baseline:  Goal status: IN PROGRESS  4.  Wife will carryover trained techniques to maximize patient's participation in conversation and home tasks given occasional max A  Baseline:  Goal status: IN PROGRESS   LONG TERM GOALS: Target date: 11/27/2022  Pt will ID personal information in memory book (address, family  members, medical providers) with occasional mod A from wife > 1 week Baseline:  Goal status: IN PROGRESS  2.  Pt will be able to carryover targeted functional routines (making coffee, operating remote) with occasional mod A from wife > 1 week Baseline:  Goal status: IN PROGRESS  3.  Wife will appropriately ID and implement necessary memory supports at home to aid cognitive functioning for 2/2 opportunities  Baseline:  Goal status: IN PROGRESS   ASSESSMENT:  CLINICAL IMPRESSION: Patient is a 82 y.o. M who was seen today for cognitive changes s/p recent CVA. PMHX significant for early onset dementia. Prior to stroke, pt was making his own coffee, managing his medications with personalized system and occasional cues from wife, and attending MD appointments alone. Some reduced facial recognition of family members indicated prior to stroke. Since stroke, pt experiencing increased significant difficulty operating coffee makers x2 (pods and pot), using television remote, recalling family members names, and recalling completion of tasks (I.e., washing hair multiple times). See "today's treatment" for more details. SLP and pt/wife agreed to put further ST on hold until consultation with pt's neurologist discussing pt's testing last week (PET, EEG).  OBJECTIVE IMPAIRMENTS: include attention, memory, awareness, and executive functioning. These impairments are limiting patient from managing medications, household responsibilities, and ADLs/IADLs. Factors affecting potential to achieve goals and functional outcome are ability to learn/carryover information, co-morbidities, previous level of function, and severity of impairments.. Patient will benefit from skilled SLP services to address above impairments and improve overall function.  REHAB POTENTIAL: Good  PLAN:  SLP FREQUENCY: 2x/week  SLP DURATION: 8 weeks - on hold, currently  PLANNED INTERVENTIONS: Environmental controls, Cueing hierachy,  Cognitive reorganization, Internal/external aids, Functional tasks, SLP instruction and feedback, Compensatory strategies, and Patient/family education    [facility], CCC-SLP 11/17/2022, 3:29 PM

## 2022-11-19 ENCOUNTER — Ambulatory Visit: Payer: Medicare Other

## 2022-11-19 NOTE — Procedures (Signed)
   HISTORY: 82 year old male with history of stroke and dementia  TECHNIQUE:  This is a routine 16 channel EEG recording with one channel devoted to a limited EKG recording.  It was performed during wakefulness, drowsiness and asleep.  Photic stimulation were performed as activating procedures.  There are frequent bilateral frontal muscle artifact.  Upon maximum arousal, posterior dominant waking rhythm consistent of dysrhythmic theta range activity, activities are symmetric over the bilateral posterior derivations and attenuated with eye opening.  Photic stimulation did not alter the tracing.  Hyperventilation was not performed.  During EEG recording, patient developed drowsiness and no deeper stage of sleep was achieved,   During EEG recording, there was no epileptiform discharge noted.  EKG demonstrate normal sinus rhythm.  CONCLUSION: This is an abnormal EEG.  There is evidence of mild to moderate background slowing, consistent with mild to moderate bihemispheric malfunction.  Levert Feinstein, M.D. Ph.D.  [facility] [address] [place], [place] [address] Phone: [phone] Fax:      [phone]

## 2022-11-24 ENCOUNTER — Telehealth: Payer: Self-pay | Admitting: Neurology

## 2022-11-24 NOTE — Telephone Encounter (Signed)
I'm not seeing anywhere soon where I can get him in for an hour, would you be open to using one of the held EMG spots at the end of the month? Or I could add a 4pm on 7/24 or 7/25? Let me know what you'd prefer.

## 2022-11-24 NOTE — Telephone Encounter (Signed)
At 8:12 pt wife left vm asking for results to EEG and PET scan

## 2022-11-24 NOTE — Telephone Encounter (Signed)
Please result EEG & PET when available.

## 2022-11-24 NOTE — Telephone Encounter (Signed)
Please discuss with family: All the testing is c/w alzheimer;s disease and we will have to meet to discuss.   THE Scan of the brain did show amyloid which is consistent with azheimer's pathology  2. The EEG was slowed which is consistent with memory dysfunction. No seizures however or other pathology noted.  3. The blood work did not show an alzheimer's gene however you can still develop alzheimer's without the gene  4. The blood work did show biolmarkers consistent with alzheimer's dementia  5. We do have medications such as donepezil or memantine that can help slow down memory loss. Recently we have Lecanemab and we can discuss at upcoming appointment but I am unsure if patient would quaify for this  6. I will ask my office to call and schedule an appointment with you to discuss with me.   Pod 4 can you call with results please? Jillian: can you find me an hour somewhere or an end of the day appointment for this patient?  Thanks.

## 2022-11-25 NOTE — Telephone Encounter (Signed)
See my phone message from yesterday thanks

## 2022-11-25 NOTE — Telephone Encounter (Signed)
4pm 7/25 would work thanks

## 2022-11-25 NOTE — Telephone Encounter (Signed)
Patient's wife, [name] [name], per DPR, returned my call. I discussed patient's results. The appointment for 7/25 was already scheduled. They plan on discussing this further with Dr. Lucia Gaskins at that appointment. Patient's wife verbalized understanding of results and had no questions at this time.

## 2022-11-25 NOTE — Telephone Encounter (Signed)
I called patient to discuss results. No answer, left a message asking him/family to call us back. Please route to POD 4.

## 2022-11-25 NOTE — Telephone Encounter (Signed)
Called and spoke with pt's wife, [name]. She accepted appt for 7/25 at 4:00 pm. Transferred to [facility] for results.

## 2022-12-08 ENCOUNTER — Ambulatory Visit: Payer: Medicare Other | Admitting: Family Medicine

## 2022-12-08 ENCOUNTER — Encounter: Payer: Self-pay | Admitting: Family Medicine

## 2022-12-08 VITALS — BP 122/60 | HR 70 | Temp 97.8°F | Resp 18 | Ht 67.0 in | Wt 224.9 lb

## 2022-12-08 DIAGNOSIS — I639 Cerebral infarction, unspecified: Secondary | ICD-10-CM | POA: Diagnosis not present

## 2022-12-08 DIAGNOSIS — I480 Paroxysmal atrial fibrillation: Secondary | ICD-10-CM

## 2022-12-08 DIAGNOSIS — I69319 Unspecified symptoms and signs involving cognitive functions following cerebral infarction: Secondary | ICD-10-CM

## 2022-12-08 DIAGNOSIS — E039 Hypothyroidism, unspecified: Secondary | ICD-10-CM | POA: Diagnosis not present

## 2022-12-08 MED ORDER — APIXABAN 5 MG PO TABS
5.0000 mg | ORAL_TABLET | Freq: Two times a day (BID) | ORAL | 1 refills | Status: DC
Start: 2022-12-08 — End: 2022-12-21

## 2022-12-08 MED ORDER — EZETIMIBE 10 MG PO TABS: 10.0000 mg | ORAL_TABLET | Freq: Every day | ORAL | 1 refills | Status: DC

## 2022-12-08 MED ORDER — LEVOTHYROXINE SODIUM 200 MCG PO TABS: ORAL_TABLET | ORAL | 1 refills | Status: DC

## 2022-12-08 NOTE — Progress Notes (Signed)
Established Patient Office Visit  Subjective   Patient ID: [name] [name], male    DOB: [birth year]  Age: 82 y.o. MRN: [id number]  Chief Complaint  Patient presents with   Follow-up    Patient is here to follow up after strroke he will also need medication refills on Levothyroxine 200mg , Eliquis 5mg ,and Ezetimbie10mg     HPI  Dementia/Acute CVA Pt had hx of dementia compounded by recent CVA in April. Wife reports he has completed PT and ST. They continue to see neurologist and follows up with them on Thursday. Wife reports pt would like to drive but has been on restriction until this week. Taking medicines as directed and needs refills on Synthroid 200mg , Eliquis 5mg  BID and Ezetimibe 10mg  for hypothyroidism, A fib, and HLD. Thyroid labs done 2 months ago and normal/stable. He's had lipid panel done in May by cardiologist with LDL elevated at 100. Goal 70 or less so he is taking Ezetimibe 10mg  and Crestor 40mg  daily.  Review of Systems  All other systems reviewed and are negative.    Objective:     BP 122/60   Pulse 70   Temp 97.8 F (36.6 C) (Oral)   Resp 18   Ht 5\' 7"  (1.702 m)   Wt 224 lb 14.4 oz (102 kg)   SpO2 97%   BMI 35.22 kg/m    Physical Exam Vitals and nursing note reviewed.  Constitutional:      Appearance: Normal appearance. He is obese.  HENT:     Head: Normocephalic and atraumatic.     Right Ear: External ear normal.     Left Ear: External ear normal.  Cardiovascular:     Rate and Rhythm: Normal rate and regular rhythm.     Pulses: Normal pulses.     Heart sounds: Normal heart sounds.  Pulmonary:     Effort: Pulmonary effort is normal.     Breath sounds: Normal breath sounds.  Skin:    General: Skin is warm.     Capillary Refill: Capillary refill takes less than 2 seconds.  Neurological:     General: No focal deficit present.     Mental Status: He is alert and oriented to person, place, and time. Mental status is at baseline.  Psychiatric:         Mood and Affect: Mood normal.        Behavior: Behavior normal.        Thought Content: Thought content normal.        Judgment: Judgment normal.    No results found for any visits on 12/08/22.  Last lipids Lab Results  Component Value Date   CHOL 147 09/17/2022   HDL 23 (L) 09/17/2022   LDLCALC 100 (H) 09/17/2022   TRIG 122 09/17/2022   CHOLHDL 6.4 09/17/2022   Last thyroid functions Lab Results  Component Value Date   TSH 1.717 09/16/2022      The ASCVD Risk score (Arnett DK, et al., 2019) failed to calculate for the following reasons:   The 2019 ASCVD risk score is only valid for ages 101 to 49   The patient has a prior MI or stroke diagnosis    Assessment & Plan:   Problem List Items Addressed This Visit   None  Acute CVA (cerebrovascular accident) (HCC)  Cognitive deficit due to recent stroke  Hypothyroidism, unspecified type -     Levothyroxine Sodium; TAKE 1 TAB DAILY ON EMPTY STOMACH WITH ONLY WATER FOR 30 MINS. NO  ANTACIDS, CALCIUM OR MAGNESIUM FOR 4 HOURS. AVOID BIOTIN.  Dispense: 90 tablet; Refill: 1  Paroxysmal atrial fibrillation (HCC) -     Apixaban; Take 1 tablet (5 mg total) by mouth 2 (two) times daily.  Dispense: 180 tablet; Refill: 1 -     Ezetimibe; Take 1 tablet (10 mg total) by mouth daily.  Dispense: 90 tablet; Refill: 1   To continue follow up with neurologist as scheduled. Refilled levothyroxine daily, Eliquis 5mg  bid and Ezetimibe 10mg  daily.  See back in 6  months for follow up.  No follow-ups on file.  Total time spent with patient today 24 minutes. This includes reviewing records, evaluating the patient and coordinating care. Face-to-face time >50%.   Suzan Slick, MD

## 2022-12-10 ENCOUNTER — Telehealth: Payer: Self-pay | Admitting: Family Medicine

## 2022-12-10 DIAGNOSIS — E039 Hypothyroidism, unspecified: Secondary | ICD-10-CM

## 2022-12-10 DIAGNOSIS — I480 Paroxysmal atrial fibrillation: Secondary | ICD-10-CM

## 2022-12-10 MED ORDER — FINASTERIDE 5 MG PO TABS: 5.0000 mg | ORAL_TABLET | Freq: Every day | ORAL | 1 refills | Status: DC

## 2022-12-10 NOTE — Telephone Encounter (Signed)
Spoke with patients wife and informed her that medication was refilled and sent to pharmacy in chart

## 2022-12-10 NOTE — Telephone Encounter (Signed)
Refilled Proscar 5mg  to [facility] pharmacy

## 2022-12-10 NOTE — Telephone Encounter (Signed)
Patient calling to request PCP start this medication as well, as she forgot to mention this one at the visit yesterday.  finasteride (PROSCAR) 5 MG tablet. Please advise. Katha Hamming

## 2022-12-11 ENCOUNTER — Ambulatory Visit (INDEPENDENT_AMBULATORY_CARE_PROVIDER_SITE_OTHER): Payer: Medicare Other | Admitting: Neurology

## 2022-12-11 ENCOUNTER — Encounter: Payer: Self-pay | Admitting: Neurology

## 2022-12-11 VITALS — BP 104/64 | HR 70 | Ht 67.0 in | Wt 226.0 lb

## 2022-12-11 DIAGNOSIS — I480 Paroxysmal atrial fibrillation: Secondary | ICD-10-CM

## 2022-12-11 DIAGNOSIS — G4733 Obstructive sleep apnea (adult) (pediatric): Secondary | ICD-10-CM | POA: Diagnosis not present

## 2022-12-11 DIAGNOSIS — G301 Alzheimer's disease with late onset: Secondary | ICD-10-CM

## 2022-12-11 DIAGNOSIS — F02B0 Dementia in other diseases classified elsewhere, moderate, without behavioral disturbance, psychotic disturbance, mood disturbance, and anxiety: Secondary | ICD-10-CM | POA: Diagnosis not present

## 2022-12-11 DIAGNOSIS — I63411 Cerebral infarction due to embolism of right middle cerebral artery: Secondary | ICD-10-CM

## 2022-12-11 DIAGNOSIS — J449 Chronic obstructive pulmonary disease, unspecified: Secondary | ICD-10-CM

## 2022-12-11 DIAGNOSIS — E785 Hyperlipidemia, unspecified: Secondary | ICD-10-CM | POA: Diagnosis not present

## 2022-12-11 DIAGNOSIS — Z6835 Body mass index (BMI) 35.0-35.9, adult: Secondary | ICD-10-CM

## 2022-12-11 DIAGNOSIS — I959 Hypotension, unspecified: Secondary | ICD-10-CM

## 2022-12-11 MED ORDER — DONEPEZIL HCL 5 MG PO TABS: 5.0000 mg | ORAL_TABLET | Freq: Every day | ORAL | 0 refills | Status: DC

## 2022-12-11 NOTE — Progress Notes (Signed)
[facility]    Provider:  Dr Lucia Gaskins Requesting Provider: Suzan Slick, MD Primary Care Provider:  Suzan Slick, MD  CC:  stroke, cognitive decline  12/11/2022: THE Scan of the brain did show amyloid which is consistent with azheimer's pathology   2. The EEG was slowed which is consistent with memory dysfunction. No seizures however or other pathology noted.   3. The blood work did not show an alzheimer's gene however you can still develop alzheimer's without the gene   4. The blood work did show biolmarkers consistent with alzheimer's dementia   5. We do have medications such as donepezil or memantine that can help slow down memory loss. Recently we have Lecanemab but patient would not quaify for this  We discussed the above in detail. Wife provides most information. Discuss alzheimers is a progressive disease,  Patient complains of symptoms per HPI as well as the following symptoms: memory loss, progresive . Pertinent negatives and positives per HPI. All others negative   HPI:  [name] [name] is a 82 y.o. male here as requested by Suzan Slick, MD for stroke. has HTN (hypertension); Hyperlipidemia; Vitamin D deficiency; GERD ; BPH (benign prostatic hyperplasia); Medication management; Atrial fibrillation (HCC); Hypothyroidism; OSA and COPD overlap syndrome (HCC); Morbid obesity (HCC); ASHD/CABG x 3V (11/2013); Acquired thrombophilia (HCC); Cardiac pacemaker/AICD (01/2014); Emphysema of lung (HCC); Tortuous aorta (HCC)- CXR 2018; Senile purpura (HCC); Thrombocytosis; Polycythemia vera (HCC); Memory changes; Abnormal glucose; Rectal pain; Constipation; MPN (myeloproliferative neoplasm) (HCC); JAK2 V617F mutation; Complex sleep apnea syndrome; Intolerance of continuous positive airway pressure (CPAP) ventilation; Dysphagia; Loss of appetite; Loss of weight; Generalized abdominal pain; Thrombosed external hemorrhoid; Zenker's diverticulum; Atherosclerotic heart  disease of native coronary artery without angina pectoris; Trigger ring finger of left hand; S/P CABG x 3; Regular astigmatism, right eye; 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); Ischemic cardiomyopathy; Gout; Combined forms of age-related cataract of right eye; Chronic systolic (congestive) heart failure (HCC); Bilateral carpal tunnel syndrome; Chronic ulcer of left foot with fat layer exposed (HCC); Encounter for screening colonoscopy; Acute CVA (cerebrovascular accident) (HCC); Chronic diastolic CHF (congestive heart failure) (HCC); Moderate late onset Alzheimer's dementia (HCC); Hypotension; and 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) on their problem list.  Wife provides most information.  She states that patient has been diagnosed with dementia and recently had a stroke.  She has a long list of questions.  She was referred for stroke follow-up but is adamant she wants to discuss both stroke and memory evaluation.  I explained it is very difficult to do in 1 appointment so this was an extended visit.  When results come back I will have to schedule her for an extended visit for at least an hour at the end of the day.  He is an 82 year old with a very complicated past medical history as above.  Significant for stroke includes multiple vascular risk factors including coronary artery disease status post CABG x 3, ischemic cardiomyopathy, pulmonary hypertension, congestive heart failure, left bundle branch block, prediabetes, hyperlipidemia, hypertension, mixed central and obstructive sleep apnea which is severe intolerant and needs BiPAP, COPD, afib, polycythemia vera area, morbid obesity, cardiac pacemaker, myeloproliferative neoplasm, JAK2 mutation, atherosclerosis heart disease, recent stroke.  Patient recently stopped warfarin for 5 days for colonoscopy in a few days after that wife says that he developed confusion, she feels very bad she did not take him to the emergency  room right away, he had trouble figuring  had a set up for coffee and usual things that he does, also quite fatigued, she finally took him to the ED which noted to have a stroke.  He was also diagnosed with dementia as well recently and has not been worked up.  She has a tremendous amount of questions.about all of it.  He was not given tPA because of completed stroke on head CT, out of the window and too mild to treat.  Reviewed notes, labs and imaging from outside physicians, which showed :  The patient split-night titration from March 08, 2021 had shown a AHI apnea-hypopnea index of high severity overall 42.8/h in supine sleep the AHI exacerbated to 65 so sleeping on his back is truly making apnea worse. The lowest oxygen saturation was 84% and the patient had 52 minutes of low oxygen at his baseline.   I reviewed all notes from the emergency room, reviewed images, patient had a right frontotemporal infarct embolic secondary to A-fib on Coumadin and was subtherapeutic INR.  His initial CT already showed frontal infarct so he cannot get tPA.  He had a right M2 subocclusive embolus, in addition left P1 severe stenosis, right ICA 50% stenosis, left ICA 60% stenosis.  Patient cannot get MRIs due to incompatible ICD leads.  Repeat CTs inpatient continued evolution of the infarct.  2D echo showed EF 50 to 55% and the left atrium size was severely dilated.  His LDL was 100, hemoglobin A1c was 5.6.  He was changed to Eliquis while in the hospital.  Notes did state to continue home aspirin 81 mg.  Recent Results (from the past 2160 hour(s))  CMP (Cancer Center only)     Status: Abnormal   Collection Time: 09/16/22 12:23 PM  Result Value Ref Range   Sodium 135 135 - 145 mmol/L   Potassium 4.0 3.5 - 5.1 mmol/L   Chloride 99 98 - 111 mmol/L   CO2 31 22 - 32 mmol/L   Glucose, Bld 121 (H) 70 - 99 mg/dL    Comment: Glucose reference range applies only to samples taken after fasting for at least 8 hours.   BUN  12 8 - 23 mg/dL   Creatinine 1.61 0.96 - 1.24 mg/dL   Calcium 9.8 8.9 - 04.5 mg/dL   Total Protein 6.7 6.5 - 8.1 g/dL   Albumin 4.3 3.5 - 5.0 g/dL   AST 37 15 - 41 U/L   ALT 53 (H) 0 - 44 U/L   Alkaline Phosphatase 71 38 - 126 U/L   Total Bilirubin 1.5 (H) 0.3 - 1.2 mg/dL   GFR, Estimated >40 >98 mL/min    Comment: (NOTE) Calculated using the CKD-EPI Creatinine Equation (2021)    Anion gap 5 5 - 15    Comment: Performed at [facility], [address] [place], [place] [address]  CBC with Differential (Cancer Center Only)     Status: Abnormal   Collection Time: 09/16/22 12:23 PM  Result Value Ref Range   WBC Count 13.1 (H) 4.0 - 10.5 K/uL   RBC 4.34 4.22 - 5.81 MIL/uL   Hemoglobin 16.1 13.0 - 17.0 g/dL   HCT 78.2 95.6 - 21.3 %   MCV 111.8 (H) 80.0 - 100.0 fL   MCH 37.1 (H) 26.0 - 34.0 pg   MCHC 33.2 30.0 - 36.0 g/dL   RDW 08.6 57.8 - 46.9 %   Platelet Count 404 (H) 150 - 400 K/uL   nRBC 0.0 0.0 - 0.2 %  Neutrophils Relative % 88 %   Neutro Abs 11.6 (H) 1.7 - 7.7 K/uL   Lymphocytes Relative 6 %   Lymphs Abs 0.8 0.7 - 4.0 K/uL   Monocytes Relative 4 %   Monocytes Absolute 0.5 0.1 - 1.0 K/uL   Eosinophils Relative 0 %   Eosinophils Absolute 0.0 0.0 - 0.5 K/uL   Basophils Relative 1 %   Basophils Absolute 0.1 0.0 - 0.1 K/uL   Immature Granulocytes 1 %   Abs Immature Granulocytes 0.15 (H) 0.00 - 0.07 K/uL    Comment: Performed at [facility], [address] [place], [place] [address]  Ammonia     Status: None   Collection Time: 09/16/22  2:12 PM  Result Value Ref Range   Ammonia 23 9 - 35 umol/L    Comment: Performed at [facility], [address] [place], [place] [address]  Magnesium     Status: None   Collection Time: 09/16/22  2:12 PM  Result Value Ref Range   Magnesium 1.7 1.7 - 2.4 mg/dL    Comment: Performed at [facility], [address] [place], [place] [address]  Blood  gas, venous     Status: Abnormal   Collection Time: 09/16/22  2:12 PM  Result Value Ref Range   pH, Ven 7.41 7.25 - 7.43   pCO2, Ven 46 44 - 60 mmHg   pO2, Ven <31 (LL) 32 - 45 mmHg    Comment: CRITICAL RESULT CALLED TO, READ BACK BY AND VERIFIED WITH: RN P DOWD AT 1514 09/16/22 CRUICKSHANK A    Bicarbonate 29.2 (H) 20.0 - 28.0 mmol/L   Acid-Base Excess 3.8 (H) 0.0 - 2.0 mmol/L   O2 Saturation 54.1 %   Patient temperature 37.0     Comment: Performed at [facility], [address], [place], [place] [address]  CBG monitoring, ED     Status: Abnormal   Collection Time: 09/16/22  2:42 PM  Result Value Ref Range   Glucose-Capillary 126 (H) 70 - 99 mg/dL    Comment: Glucose reference range applies only to samples taken after fasting for at least 8 hours.  TSH     Status: None   Collection Time: 09/16/22  2:51 PM  Result Value Ref Range   TSH 1.717 0.350 - 4.500 uIU/mL    Comment: Performed by a 3rd Generation assay with a functional sensitivity of <=0.01 uIU/mL. Performed at [facility], [address], [place], [place] [address]   Protime-INR     Status: Abnormal   Collection Time: 09/16/22  3:45 PM  Result Value Ref Range   Prothrombin Time 21.7 (H) 11.4 - 15.2 seconds   INR 1.9 (H) 0.8 - 1.2    Comment: (NOTE) INR goal varies based on device and disease states. Performed at [facility], [address], [place], [place] [address]   Urinalysis, Routine w reflex microscopic -Urine, Clean Catch     Status: Abnormal   Collection Time: 09/16/22  4:34 PM  Result Value Ref Range   Color, Urine YELLOW YELLOW   APPearance CLEAR CLEAR   Specific Gravity, Urine 1.015 1.005 - 1.030   pH 6.0 5.0 - 8.0   Glucose, UA NEGATIVE NEGATIVE mg/dL   Hgb urine dipstick NEGATIVE NEGATIVE   Bilirubin Urine NEGATIVE NEGATIVE   Ketones, ur NEGATIVE NEGATIVE mg/dL   Protein, ur 528 (A) NEGATIVE mg/dL   Nitrite NEGATIVE NEGATIVE  Leukocytes,Ua NEGATIVE NEGATIVE   RBC / HPF 0-5 0 - 5 RBC/hpf   WBC, UA 0-5 0 - 5 WBC/hpf   Bacteria, UA NONE SEEN NONE SEEN   Squamous Epithelial / HPF 0-5 0 - 5 /HPF   Mucus PRESENT     Comment: Performed at [facility], [address], [place], [place] [address]  Hemoglobin A1c     Status: None   Collection Time: 09/16/22  9:04 PM  Result Value Ref Range   Hgb A1c MFr Bld 5.6 4.8 - 5.6 %    Comment: (NOTE) Pre diabetes:          5.7%-6.4%  Diabetes:              >6.4%  Glycemic control for   <7.0% adults with diabetes    Mean Plasma Glucose 114.02 mg/dL    Comment: Performed at [facility], [address], [place], [place] [address]  Lipid panel     Status: Abnormal   Collection Time: 09/17/22  6:57 AM  Result Value Ref Range   Cholesterol 147 0 - 200 mg/dL   Triglycerides 606 <301 mg/dL   HDL 23 (L) >60 mg/dL   Total CHOL/HDL Ratio 6.4 RATIO   VLDL 24 0 - 40 mg/dL   LDL Cholesterol 109 (H) 0 - 99 mg/dL    Comment:        Total Cholesterol/HDL:CHD Risk Coronary Heart Disease Risk Table                     Men   Women  1/2 Average Risk   3.4   3.3  Average Risk       5.0   4.4  2 X Average Risk   9.6   7.1  3 X Average Risk  23.4   11.0        Use the calculated Patient Ratio above and the CHD Risk Table to determine the patient's CHD Risk.        ATP III CLASSIFICATION (LDL):  <100     mg/dL   Optimal  323-557  mg/dL   Near or Above                    Optimal  130-159  mg/dL   Borderline  322-025  mg/dL   High  >427     mg/dL   Very High Performed at [facility], [address] [place], [place] [address]   ECHOCARDIOGRAM COMPLETE     Status: None   Collection Time: 09/17/22 11:48 AM  Result Value Ref Range   Weight 3,534.41 oz   Height 67 in   BP 116/61 mmHg   Single Plane A4C EF 50.6 %   S' Lateral 3.50 cm   AR max vel 1.20 cm2   AV Area VTI 1.24 cm2   AV Mean grad 25.0 mmHg   AV Peak grad 38.7 mmHg   Ao pk vel  3.11 m/s   Area-P 1/2 4.68 cm2   AV Area mean vel 1.11 cm2   Est EF 50 - 55%   CBC with Differential/Platelet     Status: Abnormal   Collection Time: 09/18/22  6:56 AM  Result Value Ref Range   WBC 10.8 (H) 4.0 - 10.5 K/uL   RBC 4.06 (L) 4.22 - 5.81 MIL/uL   Hemoglobin 14.9 13.0 - 17.0 g/dL   HCT 62.8 31.5 - 17.6 %   MCV  111.3 (H) 80.0 - 100.0 fL   MCH 36.7 (H) 26.0 - 34.0 pg   MCHC 33.0 30.0 - 36.0 g/dL   RDW 40.9 81.1 - 91.4 %   Platelets 363 150 - 400 K/uL   nRBC 0.0 0.0 - 0.2 %   Neutrophils Relative % 81 %   Neutro Abs 8.8 (H) 1.7 - 7.7 K/uL   Lymphocytes Relative 11 %   Lymphs Abs 1.2 0.7 - 4.0 K/uL   Monocytes Relative 5 %   Monocytes Absolute 0.6 0.1 - 1.0 K/uL   Eosinophils Relative 1 %   Eosinophils Absolute 0.1 0.0 - 0.5 K/uL   Basophils Relative 1 %   Basophils Absolute 0.1 0.0 - 0.1 K/uL   Immature Granulocytes 1 %   Abs Immature Granulocytes 0.07 0.00 - 0.07 K/uL    Comment: Performed at [facility], [address] [place], [place] [address]  Basic metabolic panel     Status: Abnormal   Collection Time: 09/18/22  6:56 AM  Result Value Ref Range   Sodium 134 (L) 135 - 145 mmol/L   Potassium 3.9 3.5 - 5.1 mmol/L   Chloride 99 98 - 111 mmol/L   CO2 26 22 - 32 mmol/L   Glucose, Bld 120 (H) 70 - 99 mg/dL    Comment: Glucose reference range applies only to samples taken after fasting for at least 8 hours.   BUN 14 8 - 23 mg/dL   Creatinine, Ser 6.21 0.61 - 1.24 mg/dL   Calcium 9.1 8.9 - 30.8 mg/dL   GFR, Estimated >65 >78 mL/min    Comment: (NOTE) Calculated using the CKD-EPI Creatinine Equation (2021)    Anion gap 9 5 - 15    Comment: Performed at [facility], [address] [place], [place] [address]  CBC with Differential/Platelet     Status: Abnormal   Collection Time: 09/23/22  3:16 PM  Result Value Ref Range   WBC 10.4 3.4 - 10.8 x10E3/uL   RBC 4.12 (L) 4.14 - 5.80 x10E6/uL   Hemoglobin 15.1 13.0 - 17.7 g/dL   Hematocrit 95.2 84.1 -  51.0 %   MCV 104 (H) 79 - 97 fL   MCH 36.7 (H) 26.6 - 33.0 pg   MCHC 35.1 31.5 - 35.7 g/dL   RDW 32.4 40.1 - 02.7 %   Platelets 486 (H) 150 - 450 x10E3/uL   Neutrophils 78 Not Estab. %   Lymphs 14 Not Estab. %   Monocytes 5 Not Estab. %   Eos 1 Not Estab. %   Basos 1 Not Estab. %   Neutrophils Absolute 8.1 (H) 1.4 - 7.0 x10E3/uL   Lymphocytes Absolute 1.5 0.7 - 3.1 x10E3/uL   Monocytes Absolute 0.6 0.1 - 0.9 x10E3/uL   EOS (ABSOLUTE) 0.1 0.0 - 0.4 x10E3/uL   Basophils Absolute 0.1 0.0 - 0.2 x10E3/uL   Immature Granulocytes 1 Not Estab. %   Immature Grans (Abs) 0.1 0.0 - 0.1 x10E3/uL  Comprehensive metabolic panel     Status: Abnormal   Collection Time: 09/23/22  3:16 PM  Result Value Ref Range   Glucose 119 (H) 70 - 99 mg/dL   BUN 14 8 - 27 mg/dL   Creatinine, Ser 2.53 0.76 - 1.27 mg/dL   eGFR 71 >66 YQ/IHK/7.42   BUN/Creatinine Ratio 13 10 - 24   Sodium 136 134 - 144 mmol/L   Potassium 5.3 (H) 3.5 - 5.2 mmol/L   Chloride 95 (L) 96 - 106 mmol/L  CO2 27 20 - 29 mmol/L   Calcium 9.5 8.6 - 10.2 mg/dL   Total Protein 6.7 6.0 - 8.5 g/dL   Albumin 4.3 3.7 - 4.7 g/dL   Globulin, Total 2.4 1.5 - 4.5 g/dL   Albumin/Globulin Ratio 1.8 1.2 - 2.2   Bilirubin Total 1.1 0.0 - 1.2 mg/dL   Alkaline Phosphatase 77 44 - 121 IU/L   AST 33 0 - 40 IU/L   ALT 44 0 - 44 IU/L  B12 and Folate Panel     Status: Abnormal   Collection Time: 10/22/22  4:28 PM  Result Value Ref Range   Vitamin B-12 >2000 (H) 232 - 1245 pg/mL   Folate >20.0 >3.0 ng/mL    Comment: A serum folate concentration of less than 3.1 ng/mL is considered to represent clinical deficiency.   Methylmalonic acid, serum     Status: None   Collection Time: 10/22/22  4:28 PM  Result Value Ref Range   Methylmalonic Acid 217 0 - 378 nmol/L  Vitamin B1     Status: Abnormal   Collection Time: 10/22/22  4:28 PM  Result Value Ref Range   Thiamine 265.2 (H) 66.5 - 200.0 nmol/L  Vitamin D, 25-hydroxy     Status: None   Collection  Time: 10/22/22  4:28 PM  Result Value Ref Range   Vit D, 25-Hydroxy 54.8 30.0 - 100.0 ng/mL    Comment: Vitamin D deficiency has been defined by the Institute of Medicine and an Endocrine Society practice guideline as a level of serum 25-OH vitamin D less than 20 ng/mL (1,2). The Endocrine Society went on to further define vitamin D insufficiency as a level between 21 and 29 ng/mL (2). 1. IOM (Institute of Medicine). 2010. Dietary reference    intakes for calcium and D. [place]: The    [employer]. 2. Holick MF, Binkley [place], Bischoff-Ferrari HA, et al.    Evaluation, treatment, and prevention of vitamin D    deficiency: an Endocrine Society clinical practice    guideline. JCEM. 2011 Jul; 96(7):1911-30.   Homocysteine     Status: None   Collection Time: 10/22/22  4:28 PM  Result Value Ref Range   Homocysteine 8.5 0.0 - 21.3 umol/L  Ammonia     Status: None   Collection Time: 10/22/22  4:28 PM  Result Value Ref Range   Ammonia 38 28 - 135 ug/dL  ATN PROFILE     Status: Abnormal   Collection Time: 10/22/22  4:28 PM  Result Value Ref Range   A -- Beta-amyloid 42/40 Ratio 0.098 (L) >0.102   Beta-amyloid 42 32.11 pg/mL   Beta-amyloid 40 328.05 pg/mL   T -- p-tau181 1.90 (H) 0.00 - 0.97 pg/mL   N -- NfL, Plasma 70.80 (H) 0.00 - 11.55 pg/mL   ATN SUMMARY Comment     Comment:                        A+ T+ N+ A low beta-amyloid ratio and high pTau181 and NfL concentrations were observed at this time. These results are consistent with the presence of Alzheimer's- related pathology. Plasma findings may be less precise than CSF or PET. Additional assessments may be necessary. These tests are intended to be used only in the context of clinical care.    Information: Comment     Comment: Beta-amyloid 42 and Beta-amyloid 40: Plasma beta-amyloid 1-42/1-40 ratios less than or equal to 0.102 suggest a higher probability  of a patient being clinically diagnosed with  Alzheimer's Disease (AD), while values above 0.102 suggest a lower probability of AD diagnosis. Precise plasma testing of Beta-amyloid 42 and Beta-amyloid 40 has demonstrated comparable effectiveness to traditional cerebrospinal fluid testing and amyloid positron emission tomography (PET) scans. When assessing the risk of AD pathology as the underlying cause for mild cognitive impairment (MCI) or dementia, it is important to consider various factors such as medical and family history, nutritional deficiency biomarkers, neuroimaging, and physical, neurological, and neuropsychological examinations. These tests were developed and their performance characteristics determined by [facility]. They have not been cleared or approved by the Food and Drug Administration.                                                                    * METHODOLOGY: Beta-amyloid 42/40 Ratio: Sysmex Chemiluminescence Enzyme Immunoassay (CLEIA) NfL and p-tau181: Tests performed by Roche [employer] Electrochemiluminescence Immunoassay (ECLIA). Values obtained with different methods cannot be used interchangeable. These tests were developed and their performance characteristics determined by [facility]. They have not been cleared or approved by the Food and Drug Administration.                                                                    * p-tau181 INFORMATION: For individual 43-63 years of age:  0.00-0.95 pg/mL Reference interval is based on a population of ostensibly healthy individuals aged 38 to 5 years For individual greater than 61 years of age:  0.00-0.97 pg/mL Results greater than the clinical cut-off of 0.97 pg/mL in patients greater than 61 years of age are correlated with Abeta amyloid pathology as determined by amyloid PET imaging.                                                                     * 1. Interpretation comments are based on a consensus between [facility] and the  Internation Working Group recommendations for ATN panel interpretation published by Samantha Crimes al 2018 and updated in [place] et al 2021.                                                                    *  Hampel H, Cummings J, Blennow K, Halford Decamp [address], [place]. Developing the ATX(N) classification for use across the Alzheimer disease continiuum. Nat Rev Neurol. 2021 Sep;17(9):580-589.  Ree Kida CR Mervyn Skeeters DA, Blennow K, et al. A/T/N: An unbiased descriptive classification scheme for Alzheimer disease biomarkers. Neurology. 2016 Aug 2;87(5):539-547.   APOE Alzheimer's  Risk     Status: None   Collection Time: 10/22/22  4:28 PM  Result Value Ref Range   Methodology: Comment     Comment: Patient DNA is assayed for the APOE genotype by PCR amplification of a specific region in exon 4 of the APOE gene followed by digestion with restriction enzyme Hha I and separation of fragments by polyacrylamide gel electrophoresis. This approach allows the APOE E2, E3, and E4 alleles to be distinguished. Analytical sensitivity and specificity are >99.5%. Individuals are interpreted as having one of the following genotypes: E2/E2, E3/E3, E4/E4, E2/E3, E2/E4, E3/E4.    APO E Genotyping Result: E3/E3    Interpretation: Comment     Comment: Negative for the APOE4 variant that is associated with increased risk for late onset Alzheimer's disease (AD). E3/E3 is the most common APOE genotype and is not associated with increased risk for AD.  RECOMMENDATIONS Genetic counseling is recommended. Due to the lack of measures to prevent the development of AD, the ACMG/NSGC guidelines do not recommend presymptomatic testing, but if it is performed, guidelines are provided Elonda Husky JS et al. 2011). The APOE Genotyping: Alzheimer's Risk test is not recommended for children.  NOTE: This is not a diagnostic test. Results should be interpreted along with clinical findings and other data. This test evaluates  only for the APOE genotype and cannot detect genetic abnormalities elsewhere in the genome. It should be realized that there are possible sources of error including sample misidentification, rare technical errors, trace contamination of PCR reactions, and rare genetic variants that may interfere with analysis.  Fo r inquiries or genetic consultation, please call [facility] at [phone].    Comment: Comment     Comment: INFORMATION ABOUT THE APOE GENOTYPE AND ALZHEIMER'S DISEASE  Alzheimer's disease (AD) is the most common form of dementia in the elderly and currently affects more than 5 million Americans. It is a progressive neurodegenerative disorder with brain findings of plaques and neurofibrillary tangles containing beta-amyloid and tau protein respectively.  The predominant form of AD is late onset (age > 7-65), which can be familial (15-20%) or sporadic. The APOE4 variant increases the risk for late onset AD and may contribute to the pathology of the disease. This risk is increased by approximately 2 to 3-fold for individuals with one copy of the APOE4 variant and by approximately 10 to15-fold for individuals with two copies of this variant (E4/E4 genotype). The APOE2 variant has some protective effect against development of late onset AD. The lifetime risk for late onset AD is approximately 10-12% in the general population, though it is higher in women than men and doubles  when there is a first degree relative with this disorder. The lifetime risk is approximately 9% for individuals negative for APOE4, and for individuals with E4/E4 may be as high as 25% for males and 45% for females. Among patients with late onset AD, the presence of APOE4 may lead to earlier development of symptoms.  However, APOE4 is neither necessary nor sufficient for the development of AD. Approximately 30-50% of patients with late onset AD do not have an APOE4 allele.  APOE4 is common,  with 25% of the general population having one copy and 1% having two copies of this variant. Among patients with late onset AD, 50-70% are positive for APOE4.  The development of late onset AD is influenced by many factors other than APOE4 including age, gender, family history, level of education and history of head trauma. Midlife cardiovascular risk factors in  individuals with APOE4 also increase risk for cognitive decline. A number of genetic influences in addition to APOE4 h ave also been reported and are under investigation.  This test was developed and its performance characteristics determined by [facility]. It has not been cleared or approved by the Food and Drug Administration. The FDA has determined that such clearance or approval is not necessary.  REFERENCES  Altmann A et al. Sex modifies the APOE-related risk of developing Alzheimer disease. Annal Neurol 2014;75(4):563-573  Bird TD. Alzheimer Disease Overview. GeneReviews (internet). Pagon RA et al., editors. [address] [place]: [place], [place], [place]. Last revised 2014.  De Burrs et al. Genetic counseling and testing for Alzheimer disease: Joint practice guidelines of the [employer] of [employer] and the [employer] of [employer]. Genet in Med 2011;13(6)597-605.  Schipper HM. Apolipoprotein E: Implications for AD neurobiology, epidemiology and risk assessment. Neurobiology of Aging 2011;32:778-790   Urinalysis, Routine w reflex microscopic     Status: None   Collection Time: 10/22/22  4:41 PM  Result Value Ref Range   Specific Gravity, UA 1.008 1.005 - 1.030   pH, UA 7.0 5.0 - 7.5   Color, UA Yellow Yellow   Appearance Ur Clear Clear   Leukocytes,UA Negative Negative   Protein,UA Negative Negative/Trace   Glucose, UA Negative Negative   Ketones, UA Negative Negative   RBC, UA Negative Negative   Bilirubin, UA Negative Negative   Urobilinogen, Ur 0.2 0.2 - 1.0 mg/dL    Nitrite, UA Negative Negative   Microscopic Examination Comment     Comment: Microscopic not indicated and not performed.  Culture, Urine     Status: None   Collection Time: 10/22/22  4:43 PM   Specimen: Urine   UR  Result Value Ref Range   Urine Culture, Routine Final report    Organism ID, Bacteria No growth     09/17/2022:  EXAM: CT HEAD WITHOUT CONTRAST   TECHNIQUE: Contiguous axial images were obtained from the base of the skull through the vertex without intravenous contrast.   RADIATION DOSE REDUCTION: This exam was performed according to the departmental dose-optimization program which includes automated exposure control, adjustment of the mA and/or kV according to patient size and/or use of iterative reconstruction technique.   COMPARISON:  Head CT and CTA head/neck 09/16/2022.   FINDINGS: Brain: Continued evolution of the previously described infarct in the right frontal operculum. No new infarct is seen. No acute intracranial hemorrhage. No hydrocephalus or extra-axial collection. No mass effect or midline shift.   Vascular: No new hyperdense vessel or unexpected calcification.   Skull: No calvarial fracture or suspicious bone lesion. Skull base is unremarkable.   Sinuses/Orbits: Unchanged chronic right sphenoid sinusitis. Orbits are unremarkable.   Other: None.   IMPRESSION: Continued evolution of the previously described infarct in the right frontal operculum. No new infarct or acute intracranial hemorrhage.  CLINICAL DATA:  Neuro deficit, acute, stroke suspected. Altered mental status. Right frontal infarct on CT today.   EXAM: CT ANGIOGRAPHY HEAD AND NECK WITH AND WITHOUT CONTRAST   TECHNIQUE: Multidetector CT imaging of the head and neck was performed using the standard protocol during bolus administration of intravenous contrast. Multiplanar CT image reconstructions and MIPs were obtained to evaluate the vascular anatomy. Carotid  stenosis measurements (when applicable) are obtained utilizing NASCET criteria, using the distal internal carotid diameter as the denominator.   RADIATION DOSE REDUCTION: This exam was performed according to the departmental dose-optimization program which includes automated exposure  control, adjustment of the mA and/or kV according to patient size and/or use of iterative reconstruction technique.   CONTRAST:  75mL OMNIPAQUE IOHEXOL 350 MG/ML SOLN   COMPARISON:  None Available.   FINDINGS: CTA NECK FINDINGS   Aortic arch: Standard 3 vessel aortic arch with mild atherosclerotic plaque. No significant stenosis of the arch vessel origins.   Right carotid system: Patent with extensive calcified and soft plaque at the carotid bifurcation and in the proximal ICA resulting in up to 55% stenosis of the proximal ICA. Tortuous mid cervical ICA.   Left carotid system: Patent with scattered calcified and soft plaque in the mid common carotid artery without significant associated stenosis. More prominent calcified plaque at the carotid bifurcation and in the proximal ICA resulting in 60% stenosis of the proximal ICA. Tortuous distal cervical ICA.   Vertebral arteries: Patent without evidence of significant stenosis or dissection.   Skeleton: Advanced cervical disc and facet degeneration.   Other neck: No evidence of cervical lymphadenopathy or mass. 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   Upper chest: No mass or consolidation in the included lung apices.   Review of the MIP images confirms the above findings   CTA HEAD FINDINGS   Anterior circulation: The internal carotid arteries are patent from skull base to carotid termini with mild atherosclerotic plaque not resulting in significant stenosis. The right M1 segment is widely patent, however there is a severe stenosis or filling defect/subocclusive embolus involving a right M2 superior division branch vessel  corresponding to the infarct on today's earlier CT (series 7, image 230 and series 11, image 22). There is attenuation of more distal right MCA branch vessels. The left MCA and both ACAs are patent without evidence of a significant proximal stenosis. No aneurysm is identified.   Posterior circulation: The intracranial vertebral arteries are patent to the basilar with the right being moderately dominant. Calcified atherosclerosis results in at most mild right V4 stenosis. Patent AICA and SCA origins are seen bilaterally. The basilar artery is widely patent. There are small posterior communicating arteries bilaterally. Both PCAs are patent without evidence of a significant proximal stenosis on the right. There is a severe left P1 stenosis. No aneurysm is identified.   Venous sinuses: Patent.   Anatomic variants: None.   Review of the MIP images confirms the above findings   IMPRESSION: 1. Suspected subocclusive embolus in a right M2 superior division branch vessel corresponding to the infarct on today's earlier head CT. 2. Severe left P1 stenosis. 3. 55% proximal right ICA stenosis. 4. 60% proximal left ICA stenosis. 5.  Aortic Atherosclerosis (ICD10-I70.0).     Review of Systems: Patient complains of symptoms per HPI as well as the following symptoms stroke,dementia. Pertinent negatives and positives per HPI. All others negative.   Social History   Socioeconomic History   Marital status: Married    Spouse name: [name]    Number of children: 2   Years of education: 13   Highest education level: Not on file  Occupational History   Occupation: Retired  Tobacco Use   Smoking status: Former    Current packs/day: 0.00    Average packs/day: 2.0 packs/day for 25.0 years (50.0 ttl pk-yrs)    Types: Cigarettes    Start date: 05/19/1958    Quit date: 05/20/1983    Years since quitting: 39.6   Smokeless tobacco: Never  Vaping Use   Vaping status: Never Used  Substance and Sexual  Activity   Alcohol use: Never  Drug use: Never   Sexual activity: Not Currently  Other Topics Concern   Not on file  Social History Narrative   Lives with wife, [name]   Caffeine use: daily (Coffee)   Social Determinants of Health   Financial Resource Strain: Unknown (07/28/2019)   [facility] [facility]    5. Do you ever have problems being able to afford everything you need?: Not on file    4. Over the last 6 months, have you had trouble paying your heating or electricity bill?: Not on file  Food Insecurity: No Food Insecurity (06/05/2021)   Received from [facility], [facility]   Hunger Vital Sign    Worried About Running Out of Food in the Last Year: Never true    Ran Out of Food in the Last Year: Never true  Transportation Needs: No Transportation Needs (03/29/2021)   PRAPARE - Administrator, Civil Service (Medical): No    Lack of Transportation (Non-Medical): No  Physical Activity: Not on file  Stress: Not on file  Social Connections: Unknown (09/18/2021)   Received from [facility], [facility]   Social Network    Social Network: Not on file  Intimate Partner Violence: Unknown (08/20/2021)   Received from [facility]   HITS    Physically Hurt: Not on file    Insult or Talk Down To: Not on file    Threaten Physical Harm: Not on file    Scream or Curse: Not on file    Family History  Problem Relation Age of Onset   Alzheimer's disease Mother    Mental illness Mother    Depression Mother    Heart disease Father 41       had 5 MI's & died 40 yo   Hypertension Father    Alcohol abuse Son    Heart attack Son    Stroke Maternal Grandmother    Dementia Maternal Grandmother    Cancer - Prostate Other    Colon cancer Neg Hx    Stomach cancer Neg Hx    Esophageal cancer Neg Hx     Past Medical History:  Diagnosis Date   A-fib (HCC) 01/2014   AICD (automatic  cardioverter/defibrillator) present    [employer]   Anal fissure 11/10/2019   Arthritis    ASHD (arteriosclerotic heart disease) 2015   s/p CABG   Cataract    s/p left   CHF (congestive heart failure) (HCC)    COPD (chronic obstructive pulmonary disease) (HCC)    by CXR, pt unsure of this   Diverticulosis    GERD (gastroesophageal reflux disease)    Gout 2014   Hyperlipidemia    Hypertension    Hypothyroidism    LBBB (left bundle branch block)    Polycythemia vera (HCC)    Prediabetes 01/2014   Presence of permanent cardiac pacemaker    Sleep apnea    no CPAP    Patient Active Problem List   Diagnosis Date Noted   Moderate late onset Alzheimer's dementia (HCC) 12/15/2022   Hypotension 12/15/2022   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) 12/15/2022   Acute CVA (cerebrovascular accident) (HCC) 09/16/2022   Chronic diastolic CHF (congestive heart failure) (HCC) 09/16/2022   Encounter for screening colonoscopy 08/26/2022   Chronic ulcer of left foot with fat layer exposed (HCC) 07/10/2022   Zenker's diverticulum 10/16/2021   Loss of appetite 10/10/2021  Loss of weight 10/10/2021   Generalized abdominal pain 10/10/2021   Thrombosed external hemorrhoid 10/10/2021   Dysphagia 08/01/2021   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   Intolerance of continuous positive airway pressure (CPAP) ventilation 05/02/2021   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   JAK2 V617F mutation 08/27/2020   Rectal pain 11/10/2019   Constipation 11/10/2019   Abnormal glucose 10/12/2019   Memory changes 01/10/2019   Polycythemia vera (HCC) 10/21/2018   Thrombocytosis 12/29/2017   Senile purpura (HCC) 09/01/2017   Emphysema of lung (HCC) 06/01/2017   Tortuous aorta (HCC)- CXR 2018 06/01/2017   Regular astigmatism, right eye 10/16/2016   Combined forms of age-related cataract of right eye 10/16/2016   Trigger ring finger of left hand 08/06/2016   Bilateral  carpal tunnel syndrome 08/06/2016   Ischemic cardiomyopathy 06/22/2015   ASHD/CABG x 3V (11/2013) 02/06/2015   Acquired thrombophilia (HCC) 02/06/2015   Cardiac pacemaker/AICD (01/2014) 02/06/2015   Morbid obesity (HCC) 01/03/2015   HTN (hypertension) 01/02/2015   Hyperlipidemia 01/02/2015   Vitamin D deficiency 01/02/2015   GERD  01/02/2015   BPH (benign prostatic hyperplasia) 01/02/2015   Medication management 01/02/2015   Atrial fibrillation (HCC) 01/02/2015   Hypothyroidism 01/02/2015   OSA and COPD overlap syndrome (HCC) 01/02/2015   Chronic systolic (congestive) heart failure (HCC) 02/17/2014   S/P CABG x 3 02/13/2014   Atherosclerotic heart disease of native coronary artery without angina pectoris 02/08/2014   Gout 02/08/2014   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) 02/06/2014    Past Surgical History:  Procedure Laterality Date   CARDIAC DEFIBRILLATOR PLACEMENT  07/2014   CATARACT EXTRACTION W/ INTRAOCULAR LENS IMPLANT Left    COLONOSCOPY  08/26/2022   normal   CORONARY ARTERY BYPASS GRAFT  11/2013   ESOPHAGOGASTRODUODENOSCOPY  08/26/2022   esophageal dilation   lumb laminectomy  [id number]   x 3   LUMBAR FUSION  2013   NASAL SEPTOPLASTY W/ TURBINOPLASTY Bilateral 01/30/2016   Procedure: NASAL SEPTOPLASTY WITH BILATERAL TURBINATE REDUCTION;  Surgeon: Drema Halon, MD;  Location[facility];  Service: ENT;  Laterality: Bilateral;   TONSILLECTOMY     UVULECTOMY N/A 01/30/2016   Procedure: Alfredo Martinez;  Surgeon: Drema Halon, MD;  Location: [facility] OR;  Service: ENT;  Laterality: N/A;    Current Outpatient Medications  Medication Sig Dispense Refill   acetaminophen (TYLENOL) 500 MG tablet Take 500 mg by mouth as needed for mild pain.     ALLOPURINOL PO Take 300 mg by mouth daily.     apixaban (ELIQUIS) 5 MG TABS tablet Take 1 tablet (5 mg total) by mouth 2 (two) times daily. 180 tablet 1   aspirin EC 81 MG tablet Take 81 mg by mouth daily.      Biotin 81191 MCG TABS Take 1 tablet by mouth daily.     Cyanocobalamin (B-12) 1000 MCG CAPS Take 1 capsule by mouth daily.     Docosahexaenoic Acid (DHA OMEGA 3 PO) Take 1 tablet by mouth daily.     docusate sodium (COLACE) 100 MG capsule Take 100 mg by mouth 2 (two) times daily. 1 tablet in the morning and 1 tablet at bedtime (stool softener)     donepezil (ARICEPT) 5 MG tablet Take 1 tablet (5 mg total) by mouth at bedtime. 30 tablet 0   ezetimibe (ZETIA) 10 MG tablet Take 1 tablet (10 mg total) by mouth daily. 90 tablet 1   finasteride (PROSCAR) 5 MG tablet Take 1 tablet (5 mg  total) by mouth daily. 90 tablet 1   levothyroxine (SYNTHROID) 200 MCG tablet TAKE 1 TAB DAILY ON EMPTY STOMACH WITH ONLY WATER FOR 30 MINS. NO ANTACIDS, CALCIUM OR MAGNESIUM FOR 4 HOURS. AVOID BIOTIN. 90 tablet 1   magnesium oxide (MAG-OX) 400 MG tablet Take 400 mg by mouth daily.     MELATONIN PO Take 1 tablet by mouth daily as needed (For sleep).     metoprolol (TOPROL-XL) 200 MG 24 hr tablet Take 200 mg by mouth daily.     Multiple Vitamin (MULTIVITAMIN) tablet Take 1 tablet by mouth daily.     Probiotic Product (PROBIOTIC-10 PO) Take 1 tablet by mouth daily.     rosuvastatin (CRESTOR) 40 MG tablet Take 1 tablet (40 mg total) by mouth daily. 90 tablet 3   UNABLE TO FIND Med Name: Tumeric     No current facility-administered medications for this visit.    Allergies as of 12/11/2022 - Review Complete 12/11/2022  Allergen Reaction Noted   Other Swelling 01/18/2016    Vitals: BP 104/64   Pulse 70   Ht 5\' 7"  (1.702 m)   Wt 226 lb (102.5 kg)   BMI 35.40 kg/m  Last Weight:  Wt Readings from Last 1 Encounters:  12/11/22 226 lb (102.5 kg)   Last Height:   Ht Readings from Last 1 Encounters:  12/11/22 5\' 7"  (1.702 m)    Physical exam: Exam: Gen: NAD, conversant, well nourised, obese, well groomed                     CV: RRR, no MRG. No Carotid Bruits. No peripheral edema, warm, nontender Eyes:  Conjunctivae clear without exudates or hemorrhage  Neuro: Detailed Neurologic Exam  Speech:    Speech is normal; fluent and spontaneous with normal comprehension.  Cognition:    10/22/2022    3:08 PM  MMSE - Mini Mental State Exam  Orientation to time 1  Orientation to Place 3  Registration 3  Attention/ Calculation 1  Recall 0  Language- name 2 objects 2  Language- repeat 1  Language- follow 3 step command 3  Language- read & follow direction 1  Write a sentence 0  Copy design 1  Total score 16    Cranial Nerves:    The pupils are equal, round, and reactive to light. Pupils too small to visualize fundi, attempted Extraocular movements are intact. Trigeminal sensation is intact and the muscles of mastication are normal. The face is symmetric. The palate elevates in the midline. Hearing intact. Voice is normal. Shoulder shrug is normal. The tongue has normal motion without fasciculations.   Coordination: nml  Gait: Not parkinsoniam  Motor Observation:    No asymmetry, no atrophy, and no involuntary movements noted. Tone:    Normal muscle tone.    Posture:    Posture is normal. normal erect    Strength:    Strength is rqual and symmetrica;in the upper and lower limbs.      Sensation: intact to LT     Reflex Exam:  DTR's:    Deep tendon reflexes in the upper and lower extremities are symmetrical bilaterally.   Toes:    The toes are downgoing bilaterally.   Clonus:    Clonus is absent.     Assessment/Plan:  [name] [name] is a 82 y.o. male here as requested by Marvel Plan, MD for stroke. has HTN (hypertension); Hyperlipidemia, mixed; Vitamin D deficiency; GERD ; BPH (benign prostatic hyperplasia); Medication  management; Atrial fibrillation (HCC); Hypothyroidism; OSA and COPD overlap syndrome (HCC); Morbid obesity (HCC); ASHD/CABG x 3V (11/2013); Acquired thrombophilia (HCC); Cardiac pacemaker/AICD (01/2014); Emphysema of lung (HCC); Tortuous aorta (HCC)- CXR 2018;  Senile purpura (HCC); Thrombocytosis; Polycythemia vera (HCC); Memory changes; Abnormal glucose; Rectal pain; Constipation; MPN (myeloproliferative neoplasm) (HCC); JAK2 V617F mutation; Complex sleep apnea syndrome; Intolerance of continuous positive airway pressure (CPAP) ventilation; Dysphagia; Loss of appetite; Loss of weight; Generalized abdominal pain; Thrombosed external hemorrhoid; Zenker's diverticulum; Atherosclerotic heart disease of native coronary artery without angina pectoris; Trigger ring finger of left hand; S/P CABG x 3; Regular astigmatism, right eye; 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  Alzheimer's Dementia, moderate:  They are referred for stroke follow up but wife is insistent she wants to be evaluated for alzheimers and stroke which is very difficult to do in one appointment,extended visit.When results are back will need to set up at least an hour follow up or more, patient's wife had a page full of written questions. Start Aricept. In 2 weeks mychart me and we can increase to 10mg  a day Then in 2 weeks, mychart me and we can start namenda   THE MIND DIET - look into it Discussed Recommendations to prevent or slow progression of cognitive decline:   12/11/2022: THE Scan of the brain did show amyloid which is consistent with azheimer's pathology   2. The EEG was slowed which is consistent with memory dysfunction. No seizures however or other pathology noted.   3. The blood work did not show an alzheimer's gene however you can still develop alzheimer's without the gene   4. The  blood work did show biolmarkers consistent with alzheimer's dementia   5. We do have medications such as donepezil or memantine that can help slow down memory loss. Recently we have Lecanemab but patient would not quaify for this  We discussed the above in detail. Wife provides most information. Discuss alzheimers is a progressive disease,answered all questions. He can see our NP next and then be referred back to pcp if appropriate.   DX: alzheimer's disease, moderate. Start aricept.  Meds ordered this encounter  Medications   donepezil (ARICEPT) 5 MG tablet    Sig: Take 1 tablet (5 mg total) by mouth at bedtime.    Dispense:  30 tablet    Refill:  0   Start Aricept. In 2 weeks mychart me and we can increase to 10mg  a day Then in 2 weeks, mychart me and we can start namenda Dementia: MMSE 16 out of 30.  We discussed dementia at length different kinds of dementia.  From his symptoms it sounds as though he may have Alzheimer's.  Patient's wife wanted him to be treated unfortunately he really needs a thorough evaluation before treating him for his cognitive problems.  We discussed medication such as Aricept and Namenda, also Lecanto Mab which he is likely not a candidate for given 16 out of 22 and his other comorbidities and risk of bleeding.     THE MIND DIET - look into it, discussed, gave literatire Discussed Recommendations to prevent or slow progression of cognitive decline:   Stroke: patient had a right frontotemporal infarct  embolic secondary to A-fib on Coumadin and was subtherapeutic INR.  His initial CT already showed frontal infarct so he cannot get tPA.  He had a right M2 subocclusive embolus, in addition left P1 severe stenosis, right ICA 50% stenosis, left ICA 60% stenosis.  Patient cannot get MRIs due to incompatible ICD leads.  Repeat CTs inpatient continued evolution of the infarct.  2D echo showed EF 50 to 55% and the left atrium size was severely dilated.  His LDL was 100,  hemoglobin A1c was 5.6.  He was changed to Eliquis while in the hospital.  Notes did state to continue home aspirin 81 mg.  -Continue Eliquis.  Also continue aspirin for coronary artery disease. -Goal for hypertension is normotensive -His LDL was elevated, now goal is less than 70, now on Crestor 40 and Zetia and recommend he follows with cardiology in lipid clinic to consider PCSK9 inhibitor such as Repatha -He has multiple risk factors.  We discussed his severe obstructive sleep apnea not using BiPAP which can cause multiple sequelae including dementia and stroke.  But he cannot tolerate it and they refuse any follow-up for this.  He is of advanced age, history of stroke TIA, coronary artery disease and polycythemia with positive JAK2 in addition to documentation above. -I discussed obesity and weight loss and exercise and diet -Patient has a pacemaker/AICD cannot get MRIs -Needs to follow-up for pulmonary hypertension, left bundle branch block, dilated left atrium, lipid clinic, cardiology, and I highly recommended sleep doctor but they declined - Hypotension- recommend HTN in 120s systolic, more fluids, discuss meds with pcp - obeity, weight loss and exercise and socialization, discussed   Cc: Rucker, Magdalen Spatz, MD,  Suzan Slick, MD  Naomie Dean, MD  [facility] [address] [place], [place] [address]  Phone [phone] Fax [phone]  I spent over 45 minutes of face-to-face and non-face-to-face time with patient on the  1. Moderate late onset Alzheimer's dementia without behavioral disturbance, psychotic disturbance, mood disturbance, or anxiety (HCC)   2. OSA and COPD overlap syndrome (HCC), severe, refuses cpap   3. Paroxysmal atrial fibrillation (HCC)   4. Hyperlipidemia, unspecified hyperlipidemia type   5. Hypotension, unspecified hypotension type   6. 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 Class 2 severe  obesity due to excess calories with serious comorbidity and body mass index (BMI) of 35.0 to 35.9 in adult [facility])     diagnosis.  This included previsit chart review, lab review, study review, order entry, electronic health record documentation, patient education on the different diagnostic and therapeutic options, counseling and coordination of care, risks and benefits of management, compliance, or risk factor reduction

## 2022-12-11 NOTE — Patient Instructions (Addendum)
Start Aricept. In 2 weeks mychart me and we can increase to 10mg  a day Then in 2 weeks, mychart me and we can start namenda   THE MIND DIET - look into it Recommendations to prevent or slow progression of cognitive decline:   Exercise You should increase exercise 30 to 45 minutes per day at least 3 days a week although 5 to 7 would be preferred. Any type of exercise (including walking) is acceptable although a recumbent bicycle may be best if you are unsteady. Disease related apathy can be a significant roadblock to exercise and the only way to overcome this is to make it a daily routine and perhaps have a reward at the end (something your loved one loves to eat or drink perhaps) or a personal trainer coming to the home can also be very useful. In general a structured, repetitive schedule is best.   Cardiovascular Health: You should optimize all cardiovascular risk factors (blood pressure, sugar, cholesterol) as vascular disease such as strokes and heart attacks can make memory problems much worse.   Diet: Eating a heart healthy (Mediterranean) diet is also a good idea; fish and poultry instead of red meat, nuts (mostly non-peanuts), vegetables, fruits, olive oil or canola oil (instead of butter), minimal salt (use other spices to flavor foods), whole grain rice, bread, cereal and pasta and wine in moderation.  General Health: Any diseases which effect your body will effect your brain such as a pneumonia, urinary infection, blood clot, heart attack or stroke. Keep contact with your primary care doctor for regular follow ups.  Sleep. A good nights sleep is healthy for the brain. Seven hours is recommended. If you have insomnia or poor sleep habits see the recommendations below  Tips: Structured and consistent daytime and nighttime routine, including regular wake times, bedtimes, and mealtimes, will be important for the patient to avoid confusion. Keeping frequently used items in designated places  will help reduce stress from searching. If there are worries about getting lost do not let the patient leave home unaccompanied. They might benefit from wearing an identification bracelet that will help others assist in finding home if they become lost. Information about nationwide safe return services and other helpful resources may be obtained through the Alzheimer's Association helpline at [phone].  Finances, Power of Restaurant manager, fast food Directives: You should consider putting legal safeguards in place with regard to financial and medical decision making. While the spouse always has power of attorney for medical and financial issues in the absence of any form, you should consider what you want in case the spouse / caregiver is no longer around or capable of making decisions.   [place] : MovieEvening.com.au.pdf  Or Google "[facility]" AND "An Proofreader for [employer]  Other States: PainBasics.com.au  The signature on these forms should be notarized.   DRIVING:   Driving only during the day Drive only to familiar Locations Avoid driving during bad weather  If you would like to be tested to see if you are driving safely, [facility] has a Clinical Driving Evaluation. To schedule an appointment call [phone].                RESOURCES:  Memory Loss: Improve your short term memory By Laverle Patter  The Alzheimer's Reading Room [email or web address]   The Alzheimer's Compendium http://www.alzcompend.info/  [employer] www.dukefamilysupport.UVO [phone]  Recommended resources for caregivers (All can be purchased on Amazon):  1) A Caregiver's Guide to Dementia: Using Activities and  Other Strategies to Prevent, Reduce and Manage Behavioral Symptoms by Mahala Menghini. Bethena Midget and [employer]   2) A Caregiver's Guide to Owens & Minor Dementia by Briant Sites MS BSN and Velia Meyer   3) What If It's Not Alzheimer's?: A Caregiver's Guide to Dementia by Neila Gear (Author), Roberts Gaudy (Editor)  3) The 36 hour day by Rabins and Mace  4) Understanding Difficult Behaviors by Gerre Scull and White  Online course for helping caregivers reduce stress, guilt and frustration called the Caregivers Helpbook. The website is www.powerfultoolsforcaregivers.org  As a caregiver you are a Government social research officer. Problems you face as a caregiver are usually unique to your situation and the way your loved-one's disease manifests itself. The best way to use these books is to look at the Table of Contents and read any chapters of interest or that apply to challenges you are having as a caregiver.  NATIONAL RESOURCES: For more information on neurological disorders or research programs funded by the [employer] of Neurological Disorders and Stroke, contact the Institute's Gaffer (BRAIN) at: BRAIN [address] [place] [address] [phone] (toll-free) ToledoAutomobile.co.uk  Information on dementia is also available from the following organizations: Alzheimer's Disease Education and Referral (ADEAR) Center [employer] on Aging [address] [place] [address] [phone] (toll-free) CashCowGambling.be  Alzheimer's Association [address] [place], [place] [address] [phone] (toll-free, 24-hour helpline) [phone] (TDD) LimitLaws.hu  Alzheimer's Foundation of [place] [address] [place], [place] [address] [phone] (toll-free) www.alzfdn.org  Alzheimer's Drug Discovery Foundation [address], [address] [place], [place] [address] [phone] www.alzdiscovery.org  Association for Frontotemporal Degeneration [employer] [address] [place], [place] [address] [phone] (toll-free) www.theaftd.[facility]  [facility] [facility] [address] [place], [place] [address] [phone] (toll-free) www.brightfocus.org/alzheimers  [facility]'s [facility] [address], [address] [place], [place] [address] [phone] www.BushWebsites.nl  Lewy Body Dementia Association [address] [place], [place] [address] [phone] (toll-free LBD Caregiver Link) www.lbda.[facility] [address] [place], [place] [address] [phone] (toll-free) [phone] [facility]) http://www.maynard.net/  National Organization for Rare Disorders [address] [place], [place] [address] [phone] [phone]) (toll-free) www.rarediseases.org  The Dementias: Hope Through Research was jointly produced by the [employer] of Neurological Disorders and Stroke (NINDS) and the [employer] on Aging (NIA), both part of the [employer], the [employer] research agency--supporting scientific studies that turn discovery into health. NINDS is the nation's leading funder of research on the brain and nervous system. The NINDS mission is to reduce the burden of neurological disease. For more information and resources, visit ToledoAutomobile.co.uk [1] or call 6183293875. NIA leads the federal government effort conducting and supporting research on aging and the health and well-being of older people. NIA's Alzheimer's Disease Education and Referral (ADEAR) Center offers information and publications on dementia and caregiving for families, caregivers, and professionals. For more information, visit CashCowGambling.be [2] or call [phone]. Also available from NIA are publications and information about Alzheimer's disease as well as the booklets Frontotemporal Disorders: Information for Patients, Families, and  Caregivers and Lewy Body Dementia: Information for Patients, Families, and Professionals. Source URL: VoiceZap.is     Recommend a driving test  Should you wish to pursue a formalized driving evaluation, they could reach out to the following agencies:   [employer] in [place]: [phone]   [facility] Rehabilitative  Services: [phone]   [facility]: [phone]   [facility]: [phone] or [phone]   Recommend Aricept/Donepezil and Memantine/Namenda  Donepezil Tablets What is this medication? DONEPEZIL (doe NEP e zil) treats memory loss and confusion (dementia) in people who have Alzheimer disease. It works by improving attention, memory, and the ability to engage in daily activities. It is not a cure for dementia or Alzheimer disease. This medicine may be used for other purposes; ask your health care provider or pharmacist if you have questions. COMMON BRAND NAME(S): Aricept What should I tell my care team before I take this medication? They need to know if you have any of these conditions: Asthma or other lung disease Difficulty passing urine Head injury Heart disease History of irregular heartbeat Liver disease Seizures (convulsions) Stomach or intestinal disease, ulcers or stomach bleeding An unusual or allergic reaction to donepezil, other medications, foods, dyes, or preservatives Pregnant or trying to get pregnant Breast-feeding How should I use this medication? Take this medication by mouth with a glass of water. Follow the directions on the prescription label. You may take this medication with or without food. Take this medication at regular intervals. This medication is usually taken before bedtime. Do not take it more often than directed. Continue to take your medication even if you feel better. Do not stop taking except on your care team's advice. If you are taking the 23 mg  donepezil tablet, swallow it whole; do not cut, crush, or chew it. Talk to your care team about the use of this medication in children. Special care may be needed. Overdosage: If you think you have taken too much of this medicine contact a poison control center or emergency room at once. NOTE: This medicine is only for you. Do not share this medicine with others. What if I miss a dose? If you miss a dose, take it as soon as you can. If it is almost time for your next dose, take only that dose, do not take double or extra doses. What may interact with this medication? Do not take this medication with any of the following: Certain medications for fungal infections like itraconazole, fluconazole, posaconazole, and voriconazole Cisapride Dextromethorphan; quinidine Dronedarone Pimozide Quinidine Thioridazine This medication may also interact with the following: Antihistamines for allergy, cough and cold Atropine Bethanechol Carbamazepine Certain medications for bladder problems like oxybutynin, tolterodine Certain medications for Parkinson's disease like benztropine, trihexyphenidyl Certain medications for stomach problems like dicyclomine, hyoscyamine Certain medications for travel sickness like scopolamine Dexamethasone Dofetilide Ipratropium NSAIDs, medications for pain and inflammation, like ibuprofen or naproxen Other medications for Alzheimer's disease Other medications that prolong the QT interval (cause an abnormal heart rhythm) Phenobarbital Phenytoin Rifampin, rifabutin or rifapentine Ziprasidone This list may not describe all possible interactions. Give your health care provider a list of all the medicines, herbs, non-prescription drugs, or dietary supplements you use. Also tell them if you smoke, drink alcohol, or use illegal drugs. Some items may interact with your medicine. What should I watch for while using this medication? Visit your care team for regular checks on  your progress. Check with your care team if your symptoms do not get better or if they get worse. You may get drowsy or dizzy. Do not drive, use machinery, or do anything that needs mental alertness until you know how this medication affects you. What side effects may I notice from receiving this medication? Side effects that you should report to your care team as soon as possible:  Allergic reactions--skin rash, itching, hives, swelling of the face, lips, tongue, or throat Peptic ulcer--burning stomach pain, loss of appetite, bloating, burping, heartburn, nausea, vomiting Seizures Slow heartbeat--dizziness, feeling faint or lightheaded, confusion, trouble breathing, unusual weakness or fatigue Stomach bleeding--bloody or black, tar-like stools, vomiting blood or brown material that looks like coffee grounds Trouble passing urine Side effects that usually do not require medical attention (report these to your care team if they continue or are bothersome): Diarrhea Fatigue Loss of appetite Muscle pain or cramps Nausea Trouble sleeping This list may not describe all possible side effects. Call your doctor for medical advice about side effects. You may report side effects to FDA at 1-800-FDA-1088. Where should I keep my medication? Keep out of reach of children. Store at room temperature between 15 and 30 degrees C (59 and 86 degrees F). Throw away any unused medication after the expiration date. NOTE: This sheet is a summary. It may not cover all possible information. If you have questions about this medicine, talk to your doctor, pharmacist, or health care provider.  2024 Elsevier/Gold Standard (2020-12-19 00:00:00)   Memantine Tablets What is this medication? MEMANTINE (MEM an teen) treats memory loss and confusion (dementia) in people who have Alzheimer disease. It works by improving attention, memory, and the ability to engage in daily activities. It is not a cure for dementia or  Alzheimer disease. This medicine may be used for other purposes; ask your health care provider or pharmacist if you have questions. COMMON BRAND NAME(S): Namenda What should I tell my care team before I take this medication? They need to know if you have any of these conditions: Kidney disease Liver disease Seizures Trouble passing urine An unusual or allergic reaction to memantine, other medications, foods, dyes, or preservatives Pregnant or trying to get pregnant Breast-feeding How should I use this medication? Take this medication by mouth with water. Follow the directions on the prescription label. You may take this medication with or without food. Take your doses at regular intervals. Do not take your medication more often than directed. Continue to take your medication even if you feel better. Do not stop taking except on the advice of your care team. Talk to your care team about the use of this medication in children. Special care may be needed Overdosage: If you think you have taken too much of this medicine contact a poison control center or emergency room at once. NOTE: This medicine is only for you. Do not share this medicine with others. What if I miss a dose? If you miss a dose, take it as soon as you can. If it is almost time for your next dose, take only that dose. Do not take double or extra doses. If you do not take your medication for several days, contact your care team. Your dose may need to be changed. What may interact with this medication? Acetazolamide Amantadine Cimetidine Dextromethorphan Dofetilide Hydrochlorothiazide Ketamine Metformin Methazolamide Quinidine Ranitidine Sodium bicarbonate Triamterene This list may not describe all possible interactions. Give your health care provider a list of all the medicines, herbs, non-prescription drugs, or dietary supplements you use. Also tell them if you smoke, drink alcohol, or use illegal drugs. Some items may  interact with your medicine. What should I watch for while using this medication? Visit your care team for regular checks on your progress. Check with your care team if there is no improvement in your symptoms or if they get worse. This medication may affect your  coordination, reaction time, or judgment. Do not drive or operate machinery until you know how this medication affects you. Sit up or stand slowly to reduce the risk of dizzy or fainting spells. Drinking alcohol with this medication can increase the risk of these side effects. What side effects may I notice from receiving this medication? Side effects that you should report to your care team as soon as possible: Allergic reactions--skin rash, itching, hives, swelling of the face, lips, tongue, or throat Side effects that usually do not require medical attention (report to your care team if they continue or are bothersome): Confusion Constipation Diarrhea Dizziness Headache This list may not describe all possible side effects. Call your doctor for medical advice about side effects. You may report side effects to FDA at 1-800-FDA-1088. Where should I keep my medication? Keep out of the reach of children. Store at room temperature between 15 degrees and 30 degrees C (59 degrees and 86 degrees F). Throw away any unused medication after the expiration date. NOTE: This sheet is a summary. It may not cover all possible information. If you have questions about this medicine, talk to your doctor, pharmacist, or health care provider.  2024 Elsevier/Gold Standard (2021-05-28 00:00:00)  Alzheimer's Disease Alzheimer's disease is a disease that affects the way your brain works. It affects memory and causes changes in how you think, talk, and act. The disease gets worse over time. Alzheimer's disease is a form of dementia. What are the causes? Alzheimer's disease happens when a protein called beta-amyloid forms deposits in the brain. It's not  known what causes these to form. The disease may also be caused by a harmful change in a gene that's passed down, or inherited, from one or both parents. Not everyone who gets the changed gene from their parents will get the disease. What increases the risk? Being older than 82 years of age. Being male. Having any of these conditions: High blood pressure. Diabetes. Heart or blood vessel disease. Smoking. Being very overweight. Having a brain injury or a stroke in the past. Having a family history of dementia. What are the signs or symptoms?  Symptoms may happen in three stages, which often overlap. Early stage In this stage, you can still do things on your own. You may still be able to drive, work, and be social. Symptoms in this stage include: Forgetting small things, like a name, words, or what you did recently. Having a hard time with: Paying attention or learning new things. Talking with people. Doing your usual tasks. Solving problems or doing math. Following instructions. Feeling worried or nervous. Not wanting to be around people. Losing interest in doing things. Moderate stage In this stage, you'll start to need care. Symptoms include: Trouble saying what you're thinking. Memory loss that affects daily life. This can include forgetting: Things that happened recently. If you've taken medicines or eaten. Places you know. You may get lost while walking or driving. To pay bills. To bathe or use the bathroom. Being confused about where you are or what time it is. Trouble judging distance. Changes in how you feel or act. You may be moody, angry, upset, scared, worried, or suspicious. Not thinking clearly or making good choices. You may have delusions or false beliefs. Hallucinations. This means you see, hear, taste, smell, or feel things that aren't real. Severe stage In the severe stage, you'll need help with your personal care and daily activities. Symptoms  include: Memory loss getting worse. Personality changes. Not knowing  where you are. Physical problems, like trouble walking, sitting, or swallowing. More trouble talking with others. Not being able to control when you pee (urinate) or poop. More changes in how you act. How is this diagnosed? You may need to see a nervous system specialist, called a neurologist, or a health care provider who focuses on the care of older adults. Alzheimer's disease may be diagnosed based on: Your symptoms and medical history. Your provider will talk with you and your family, friends, and caregivers about your history and symptoms. A physical exam. Tests. These may include: Lab tests, such as tests on your blood or pee. Imaging tests. You may have a CT scan, a PET scan, or an MRI. A lumbar puncture. For this test, a sample of the fluid around the brain and spinal cord is taken and tested. Tests to check your thinking and memory. Genetic testing. This may be done if you get the disease before age 61 or if other family members have had the disease. How is this treated? At this time, there's no cure for Alzheimer's disease. The goals of treatment are to: Manage symptoms that affect behavior. Make sure you're safe at home. Help manage daily life for you and your caregivers. Treatment may include: Medicines. You may get medicines that can: Slow down how fast the disease gets worse. Help with memory and behavior. Cognitive therapy. This gives you support, training to help with thinking skills, and memory aids. Counseling or spiritual guidance. These can help you deal with the many feelings you may have, such as fear, anger, or feeling alone. Caregivers. These are people who help you with your daily tasks. Family support groups. These allow your family members to learn about the disease, get emotional support, and find out about resources to help take care of you. Follow these instructions at  home:  Medicines Take over-the-counter and prescription medicines only as told by your provider. Use a pill organizer or pill reminder to help you keep track of your medicines. Avoid taking medicines that can affect thinking, such as medicines for pain or sleep. Lifestyle Make healthy choices: Be active as told by your provider. Regular exercise may help with symptoms. Do not smoke, vape, or use products with nicotine or tobacco in them. Do not drink alcohol. Eat a healthy diet. When you feel a lot of stress, do something that helps you relax. Try things like yoga or deep breathing. Spend time with people. Drink enough fluid to keep your pee pale yellow. Make sure you sleep well. These tips can help: Try not to take long naps during the day. Take short naps of 30 minutes or less if needed. Keep your bedroom dark and cool. Do not exercise during the few hours before you go to bed. Avoid caffeine products in the afternoon and evening. General instructions Work with your provider to decide: What things you need help with. What your safety needs are. If you were given a bracelet that tracks where you are and shows that you're a person with memory loss, make sure you wear it at all times. Talk with your provider about whether it's safe for you to drive. Work with your family to make big legal or health decisions. This may include things like advance directives, medical power of attorney, or a living will. Where to find more information There are two ways to contact the Alzheimer's Association: Call the 24-hour helpline at 713-869-8244. Visit WesternTunes.it Contact a health care provider if: Your medicine causes  you to have nausea, vomiting, or trouble with eating. You have mood or behavior changes that are getting worse, such as feeling depressed, worried, or nervous. You have hallucinations. You or your family members are worried about your safety. You're hard to wake up. Your memory  suddenly gets worse. Get help right away if: You feel like you may hurt yourself or others. You have thoughts about taking your own life. Take one of these steps: Go to your nearest emergency room. Call 911. Call the National Suicide Prevention Lifeline at 478-438-9686 or 988. Text the Crisis Text Line at 602-722-0677. This information is not intended to replace advice given to you by your health care provider. Make sure you discuss any questions you have with your health care provider. Document Revised: 08/05/2022 Document Reviewed: 08/05/2022 Elsevier Patient Education  2024 [employer].

## 2022-12-15 DIAGNOSIS — I959 Hypotension, unspecified: Secondary | ICD-10-CM | POA: Insufficient documentation

## 2022-12-15 DIAGNOSIS — F02B0 Dementia in other diseases classified elsewhere, moderate, without behavioral disturbance, psychotic disturbance, mood disturbance, and anxiety: Secondary | ICD-10-CM | POA: Insufficient documentation

## 2022-12-15 DIAGNOSIS — I63411 Cerebral infarction due to embolism of right middle cerebral artery: Secondary | ICD-10-CM | POA: Insufficient documentation

## 2022-12-21 ENCOUNTER — Ambulatory Visit
Admission: EM | Admit: 2022-12-21 | Discharge: 2022-12-21 | Disposition: A | Discharge status: Home / Self Care | Payer: Medicare Other | Attending: Family Medicine | Admitting: Family Medicine

## 2022-12-21 ENCOUNTER — Encounter: Payer: Self-pay | Admitting: Emergency Medicine

## 2022-12-21 DIAGNOSIS — I480 Paroxysmal atrial fibrillation: Secondary | ICD-10-CM

## 2022-12-21 DIAGNOSIS — Z760 Encounter for issue of repeat prescription: Secondary | ICD-10-CM

## 2022-12-21 MED ORDER — APIXABAN 5 MG PO TABS
5.0000 mg | ORAL_TABLET | Freq: Two times a day (BID) | ORAL | 0 refills | Status: DC
Start: 2022-12-21 — End: 2023-01-20

## 2022-12-21 NOTE — Discharge Instructions (Signed)
Your medicine has been sent to the [facility]

## 2022-12-21 NOTE — ED Triage Notes (Signed)
Patient presents to Urgent Care with complaints of running out of Eliquis. Rx was prescribed on 12/10/22 sent to [facility] mail order pharmacy. Patient reports running out of medication. Has not received the mail order pharmacy rx yet. [facility] pharmacy is closed today.

## 2022-12-21 NOTE — ED Provider Notes (Signed)
[facility]    CSN: [id number] Arrival date & time: 12/21/22  1244      History   Chief Complaint Chief Complaint  Patient presents with   Medication Refill    Eliquis    HPI [name] [name] is a 82 y.o. male.   Very pleasant elderly man with dementia here with his wife for a medicine refill. The got a refill at their last medical visit on 7/24 , it was sent to his mail away pharmacy Unfortunately the med never arrived and now he is out She was unable to reach the doctor on call by phone Took last dose at bedtime    Past Medical History:  Diagnosis Date   A-fib (HCC) 01/2014   AICD (automatic cardioverter/defibrillator) present    [employer]   Anal fissure 11/10/2019   Arthritis    ASHD (arteriosclerotic heart disease) 2015   s/p CABG   Cataract    s/p left   CHF (congestive heart failure) (HCC)    COPD (chronic obstructive pulmonary disease) (HCC)    by CXR, pt unsure of this   Diverticulosis    GERD (gastroesophageal reflux disease)    Gout 2014   Hyperlipidemia    Hypertension    Hypothyroidism    LBBB (left bundle branch block)    Polycythemia vera (HCC)    Prediabetes 01/2014   Presence of permanent cardiac pacemaker    Sleep apnea    no CPAP    Patient Active Problem List   Diagnosis Date Noted   Moderate late onset Alzheimer's dementia (HCC) 12/15/2022   Hypotension 12/15/2022   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) 12/15/2022   Acute CVA (cerebrovascular accident) (HCC) 09/16/2022   Chronic diastolic CHF (congestive heart failure) (HCC) 09/16/2022   Encounter for screening colonoscopy 08/26/2022   Chronic ulcer of left foot with fat layer exposed (HCC) 07/10/2022   Zenker's diverticulum 10/16/2021   Loss of appetite 10/10/2021   Loss of weight 10/10/2021   Generalized abdominal pain 10/10/2021   Thrombosed external hemorrhoid 10/10/2021   Dysphagia 08/01/2021   Complex sleep apnea syndrome  05/02/2021   Intolerance of continuous positive airway pressure (CPAP) ventilation 05/02/2021   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   JAK2 V617F mutation 08/27/2020   Rectal pain 11/10/2019   Constipation 11/10/2019   Abnormal glucose 10/12/2019   Memory changes 01/10/2019   Polycythemia vera (HCC) 10/21/2018   Thrombocytosis 12/29/2017   Senile purpura (HCC) 09/01/2017   Emphysema of lung (HCC) 06/01/2017   Tortuous aorta (HCC)- CXR 2018 06/01/2017   Regular astigmatism, right eye 10/16/2016   Combined forms of age-related cataract of right eye 10/16/2016   Trigger ring finger of left hand 08/06/2016   Bilateral carpal tunnel syndrome 08/06/2016   Ischemic cardiomyopathy 06/22/2015   ASHD/CABG x 3V (11/2013) 02/06/2015   Acquired thrombophilia (HCC) 02/06/2015   Cardiac pacemaker/AICD (01/2014) 02/06/2015   Morbid obesity (HCC) 01/03/2015   HTN (hypertension) 01/02/2015   Hyperlipidemia 01/02/2015   Vitamin D deficiency 01/02/2015   GERD  01/02/2015   BPH (benign prostatic hyperplasia) 01/02/2015   Medication management 01/02/2015   Atrial fibrillation (HCC) 01/02/2015   Hypothyroidism 01/02/2015   OSA and COPD overlap syndrome (HCC) 01/02/2015   Chronic systolic (congestive) heart failure (HCC) 02/17/2014   S/P CABG x 3 02/13/2014   Atherosclerotic heart disease of native coronary artery without angina pectoris 02/08/2014   Gout 02/08/2014   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)  02/06/2014    Past Surgical History:  Procedure Laterality Date   CARDIAC DEFIBRILLATOR PLACEMENT  07/2014   CATARACT EXTRACTION W/ INTRAOCULAR LENS IMPLANT Left    COLONOSCOPY  08/26/2022   normal   CORONARY ARTERY BYPASS GRAFT  11/2013   ESOPHAGOGASTRODUODENOSCOPY  08/26/2022   esophageal dilation   lumb laminectomy  [id number]   x 3   LUMBAR FUSION  2013   NASAL SEPTOPLASTY W/ TURBINOPLASTY Bilateral 01/30/2016   Procedure: NASAL SEPTOPLASTY WITH  BILATERAL TURBINATE REDUCTION;  Surgeon: Drema Halon, MD;  Location: [facility] OR;  Service: ENT;  Laterality: Bilateral;   TONSILLECTOMY     UVULECTOMY N/A 01/30/2016   Procedure: Alfredo Martinez;  Surgeon: Drema Halon, MD;  Location: [facility] OR;  Service: ENT;  Laterality: N/A;       Home Medications    Prior to Admission medications   Medication Sig Start Date End Date Taking? Authorizing Provider  acetaminophen (TYLENOL) 500 MG tablet Take 500 mg by mouth as needed for mild pain.   Yes [provider]  ALLOPURINOL PO Take 300 mg by mouth daily.   Yes [provider]  aspirin EC 81 MG tablet Take 81 mg by mouth daily.   Yes [provider]  Biotin 24401 MCG TABS Take 1 tablet by mouth daily.   Yes [provider]  Cyanocobalamin (B-12) 1000 MCG CAPS Take 1 capsule by mouth daily.   Yes [provider]  Docosahexaenoic Acid (DHA OMEGA 3 PO) Take 1 tablet by mouth daily.   Yes [provider]  docusate sodium (COLACE) 100 MG capsule Take 100 mg by mouth 2 (two) times daily. 1 tablet in the morning and 1 tablet at bedtime (stool softener)   Yes [provider]  donepezil (ARICEPT) 5 MG tablet Take 1 tablet (5 mg total) by mouth at bedtime. 12/11/22  Yes Anson Fret, MD  ezetimibe (ZETIA) 10 MG tablet Take 1 tablet (10 mg total) by mouth daily. 12/08/22  Yes Rucker, Magdalen Spatz, MD  finasteride (PROSCAR) 5 MG tablet Take 1 tablet (5 mg total) by mouth daily. 12/10/22  Yes Rucker, Magdalen Spatz, MD  levothyroxine (SYNTHROID) 200 MCG tablet TAKE 1 TAB DAILY ON EMPTY STOMACH WITH ONLY WATER FOR 30 MINS. NO ANTACIDS, CALCIUM OR MAGNESIUM FOR 4 HOURS. AVOID BIOTIN. 12/08/22  Yes Rucker, Magdalen Spatz, MD  magnesium oxide (MAG-OX) 400 MG tablet Take 400 mg by mouth daily.   Yes [provider]  MELATONIN PO Take 1 tablet by mouth daily as needed (For sleep).   Yes [provider]  metoprolol (TOPROL-XL) 200 MG 24 hr tablet  Take 200 mg by mouth daily.   Yes [provider]  Multiple Vitamin (MULTIVITAMIN) tablet Take 1 tablet by mouth daily.   Yes [provider]  Probiotic Product (PROBIOTIC-10 PO) Take 1 tablet by mouth daily.   Yes [provider]  rosuvastatin (CRESTOR) 40 MG tablet Take 1 tablet (40 mg total) by mouth daily. 06/05/22  Yes Rucker, Magdalen Spatz, MD  UNABLE TO FIND Med Name: Tumeric   Yes [provider]  apixaban (ELIQUIS) 5 MG TABS tablet Take 1 tablet (5 mg total) by mouth 2 (two) times daily. 12/21/22   Eustace Moore, MD    Family History Family History  Problem Relation Age of Onset   Alzheimer's disease Mother    Mental illness Mother    Depression Mother    Heart disease Father 42  had 5 MI's & died 22 yo   Hypertension Father    Alcohol abuse Son    Heart attack Son    Stroke Maternal Grandmother    Dementia Maternal Grandmother    Cancer - Prostate Other    Colon cancer Neg Hx    Stomach cancer Neg Hx    Esophageal cancer Neg Hx     Social History Social History   Tobacco Use   Smoking status: Former    Current packs/day: 0.00    Average packs/day: 2.0 packs/day for 25.0 years (50.0 ttl pk-yrs)    Types: Cigarettes    Start date: 05/19/1958    Quit date: 05/20/1983    Years since quitting: 39.6   Smokeless tobacco: Never  Vaping Use   Vaping status: Never Used  Substance Use Topics   Alcohol use: Never   Drug use: Never     Allergies   Other   Review of Systems Review of Systems See HPI  Physical Exam Triage Vital Signs ED Triage Vitals  Encounter Vitals Group     BP 12/21/22 1308 119/70     Systolic BP Percentile --      Diastolic BP Percentile --      Pulse Rate 12/21/22 1308 70     Resp 12/21/22 1308 16     Temp 12/21/22 1308 98.2 F (36.8 C)     Temp Source 12/21/22 1307 Oral     SpO2 12/21/22 1308 93 %     Weight --      Height --      Head Circumference --      Peak Flow --      Pain Score  12/21/22 1306 0     Pain Loc --      Pain Education --      Exclude from Growth Chart --    No data found.  Updated Vital Signs BP 119/70 (BP Location: Left Arm)   Pulse 70   Temp 98.2 F (36.8 C) (Oral)   Resp 16   SpO2 93%   Visual Acuity Right Eye Distance:   Left Eye Distance:   Bilateral Distance:    Right Eye Near:   Left Eye Near:    Bilateral Near:     Physical Exam Vitals and nursing note reviewed.  Constitutional:      Appearance: He is not ill-appearing.  Neurological:     Mental Status: He is alert.  Psychiatric:     Comments: Wife largely answers questions.  Patient pleasant      UC Treatments / Results  Labs (all labs ordered are listed, but only abnormal results are displayed) Labs Reviewed - No data to display  EKG   Radiology No results found.  Procedures Procedures (including critical care time)  Medications Ordered in UC Medications - No data to display  Initial Impression / Assessment and Plan / UC Course  I have reviewed the triage vital signs and the nursing notes.  Pertinent labs & imaging results that were available during my care of the patient were reviewed by me and considered in my medical decision making (see chart for details).     Final Clinical Impressions(s) / UC Diagnoses   Final diagnoses:  Encounter for medication refill     Discharge Instructions      Your medicine has been sent to the [facility]   ED Prescriptions     Medication Sig Dispense Auth. Provider   apixaban (ELIQUIS) 5 MG  TABS tablet Take 1 tablet (5 mg total) by mouth 2 (two) times daily. 30 tablet Eustace Moore, MD      PDMP not reviewed this encounter.   Eustace Moore, MD 12/21/22 1420

## 2022-12-22 MED ORDER — LEVOTHYROXINE SODIUM 200 MCG PO TABS: ORAL_TABLET | ORAL | 1 refills | Status: DC

## 2022-12-22 MED ORDER — APIXABAN 5 MG PO TABS: 5.0000 mg | ORAL_TABLET | Freq: Two times a day (BID) | ORAL | 1 refills | Status: DC

## 2022-12-22 MED ORDER — EZETIMIBE 10 MG PO TABS: 10.0000 mg | ORAL_TABLET | Freq: Every day | ORAL | 1 refills | Status: DC

## 2022-12-22 NOTE — Telephone Encounter (Signed)
Patient's spouse calling in to state that despite what was sent, she only got finasteride, she still needs and is out of apixaban (ELIQUIS) 5 MG TABS tablet, ezetimibe (ZETIA) 10 MG tablet, levothyroxine (SYNTHROID) 200 MCG tablet. Patient states she got one prescription in the mail but never got these. Patient says they have been out for several days, if not a few weeks. Please advise. Katha Hamming

## 2022-12-22 NOTE — Telephone Encounter (Signed)
LVM for patient to return call at earliest convenience    RE:  To address prior message regarding medication refill

## 2022-12-22 NOTE — Telephone Encounter (Signed)
I have resent these 3 requested medicines to [facility]. It looks like urgent care recently filled the eliquis to [facility]. Does she want the other 2 also sent to [facility] until they receive the mail order. I'm showing on my end they were sent on 7/22 so not sure why they never received.

## 2022-12-22 NOTE — Addendum Note (Signed)
Addended by: Suzan Slick on: 12/22/2022 08:17 AM   Modules accepted: Orders

## 2022-12-23 ENCOUNTER — Telehealth: Payer: Self-pay

## 2022-12-23 NOTE — Telephone Encounter (Signed)
Contacted wife back, verified he has done well on Aricept and tolerated it. She would like to increase medication.  Per last visit 7/25 "Start Aricept. In 2 weeks mychart me and we can increase to 10mg  a day" I have pended order for review and approval.  She also stated he is not sleeping well at night. He does take melatonin but it only gives him a few hours of sleep. He has a increase in fatigue during th day but doesn't sleep well at night. Would you be willing to prescribe something to help him sleep or advise they discuss with PCP? please advise

## 2022-12-23 NOTE — Telephone Encounter (Signed)
Patients wife [name] called in stating she was told to call the office to discuss increasing patients dose of  donepezil (ARICEPT) 5 MG table.  Please give her a call back at [phone] to discuss

## 2022-12-24 ENCOUNTER — Other Ambulatory Visit: Payer: Self-pay | Admitting: Neurology

## 2022-12-24 ENCOUNTER — Encounter: Payer: Self-pay | Admitting: Family Medicine

## 2022-12-24 ENCOUNTER — Ambulatory Visit (INDEPENDENT_AMBULATORY_CARE_PROVIDER_SITE_OTHER): Payer: Medicare Other | Admitting: Family Medicine

## 2022-12-24 VITALS — BP 125/78 | HR 75 | Temp 97.4°F | Resp 18 | Ht 67.0 in | Wt 224.7 lb

## 2022-12-24 DIAGNOSIS — G4733 Obstructive sleep apnea (adult) (pediatric): Secondary | ICD-10-CM

## 2022-12-24 DIAGNOSIS — G4709 Other insomnia: Secondary | ICD-10-CM | POA: Diagnosis not present

## 2022-12-24 DIAGNOSIS — I480 Paroxysmal atrial fibrillation: Secondary | ICD-10-CM

## 2022-12-24 MED ORDER — RAMELTEON 8 MG PO TABS
8.0000 mg | ORAL_TABLET | Freq: Every day | ORAL | 0 refills | Status: DC
Start: 2022-12-24 — End: 2023-01-21

## 2022-12-24 MED ORDER — DONEPEZIL HCL 10 MG PO TABS: 10.0000 mg | ORAL_TABLET | Freq: Every day | ORAL | 4 refills | Status: DC

## 2022-12-24 MED ORDER — EZETIMIBE 10 MG PO TABS: 10.0000 mg | ORAL_TABLET | Freq: Every day | ORAL | 1 refills | Status: DC

## 2022-12-24 NOTE — Telephone Encounter (Addendum)
Contacted spouse back, informed her increase has been sent. Did advise she discuss sleeping aid with PCP. She verbally understood and was appreciate.

## 2022-12-24 NOTE — Telephone Encounter (Signed)
Patient and wife are in the office today and will address some of the issues with medication

## 2022-12-24 NOTE — Progress Notes (Signed)
Established Patient Office Visit  Subjective   Patient ID: COWEN JEWART, male    DOB: May 21, 1940  Age: 82 y.o. MRN: [id number]  Chief Complaint  Patient presents with   Medical Management of Chronic Issues    Patients wife is here with patient and states that patient is not getting enough sleep he averages about 2 to 4 hours a night, she believes it has to do with the Aricept    HPI  Insomnia Pt's wife is here today and is the primary historian as pt has had worsening cognitive impairment. She reports issues with sleep since starting the Aricept 5 mg at night. This was recently increased to 10 mg two weeks ago by his Neurologist. She feels like it's affecting his sleep. He usually is in bed at midnight and is up again at 3-4 am. Wife also reports he needs 'air' at night as he sleeps with his mouth open. Of note, explained to wife that pt does have hx of OSA not treated as he didn't tolerate the CPAP/mask. Wife is adamant that this is causing insomnia but feels like it's the Aricept that made the insomnia worse.   Pt also reports pt still hasn't received his Ezetimibe 10mg  daily through mail order. Requests a short supply be sent to [facility].  Review of Systems  Psychiatric/Behavioral:         Insomnia  All other systems reviewed and are negative.    Objective:     BP 125/78   Pulse 75   Temp (!) 97.4 F (36.3 C) (Oral)   Resp 18   Ht 5\' 7"  (1.702 m)   Wt 224 lb 11.2 oz (101.9 kg)   SpO2 99%   BMI 35.19 kg/m    Physical Exam Vitals and nursing note reviewed.  Constitutional:      Appearance: Normal appearance. He is normal weight.  HENT:     Head: Normocephalic and atraumatic.     Right Ear: External ear normal.     Left Ear: External ear normal.     Nose: Nose normal.     Mouth/Throat:     Mouth: Mucous membranes are moist.     Pharynx: Oropharynx is clear.  Cardiovascular:     Rate and Rhythm: Normal rate. Rhythm irregular.     Pulses: Normal  pulses.     Heart sounds: Normal heart sounds.  Pulmonary:     Effort: Pulmonary effort is normal.     Breath sounds: Normal breath sounds.  Abdominal:     General: Abdomen is flat.  Skin:    Capillary Refill: Capillary refill takes less than 2 seconds.  Neurological:     Mental Status: He is alert and oriented to person, place, and time. Mental status is at baseline.  Psychiatric:        Mood and Affect: Mood normal.        Behavior: Behavior normal.        Thought Content: Thought content normal.        Judgment: Judgment normal.    No results found for any visits on 12/24/22.    The ASCVD Risk score (Arnett DK, et al., 2019) failed to calculate for the following reasons:   The 2019 ASCVD risk score is only valid for ages 46 to 15   The patient has a prior MI or stroke diagnosis    Assessment & Plan:   Problem List Items Addressed This Visit   None  Other insomnia -  Ramelteon; Take 1 tablet (8 mg total) by mouth at bedtime.  Dispense: 30 tablet; Refill: 0  Paroxysmal atrial fibrillation (HCC) -     Ramelteon; Take 1 tablet (8 mg total) by mouth at bedtime.  Dispense: 30 tablet; Refill: 0 -     Ezetimibe; Take 1 tablet (10 mg total) by mouth daily.  Dispense: 30 tablet; Refill: 1  OSA (obstructive sleep apnea)   For insomnia, did advise wife and pt on the hx of OSA and this could be causing insomnia. She denies this thought and feels like it's the Aricept instead. Pt has been intolerant to CPAP mask. He does have hx of A fib and due to this condition, many insomnia medicines are cautioned due to tendency of prolonged QT when use is with Aricept. Examples would be Trazodone and Mirtazapine.  Explained to pt and wife that Remelteon is the safes option. Will have to go through [place] first. Will follow up in 4 weeks.  Refilled Ezetimibe 10mg  to [facility].   No follow-ups on file.    Suzan Slick, MD

## 2022-12-24 NOTE — Addendum Note (Signed)
Addended by: Bertram Savin on: 12/24/2022 03:02 PM   Modules accepted: Orders

## 2022-12-24 NOTE — Telephone Encounter (Signed)
Let him know I sent in increase thanks!

## 2022-12-24 NOTE — Telephone Encounter (Signed)
Wife has called to report that she never asked that the donepezil (ARICEPT) 10 MG tablet  be sent to  [facility]   , it was requested that it be sent to : [facility] [phone]

## 2022-12-24 NOTE — Telephone Encounter (Addendum)
We are sorry about this. I have sent the prescription to [facility] and canceled the prescription at [facility] (spoke with Florentina Addison).

## 2022-12-29 ENCOUNTER — Telehealth: Payer: Self-pay

## 2022-12-29 NOTE — Telephone Encounter (Signed)
Initiated Prior authorization GMW:[id number] 8MG  tablets Via: Covermymeds Case/Key:[id number] Status: Pending as of 12/29/22 Reason: Notified Pt via: pt does not have Mychart

## 2023-01-07 ENCOUNTER — Telehealth: Payer: Self-pay | Admitting: Hematology

## 2023-01-07 NOTE — Telephone Encounter (Signed)
Patient is aware of scheduled appointment times/dates

## 2023-01-14 ENCOUNTER — Ambulatory Visit: Payer: Medicare Other | Admitting: Family Medicine

## 2023-01-14 ENCOUNTER — Encounter: Payer: Self-pay | Admitting: Family Medicine

## 2023-01-14 VITALS — BP 116/84 | HR 99 | Temp 98.3°F | Ht 67.0 in | Wt 223.0 lb

## 2023-01-14 DIAGNOSIS — J0100 Acute maxillary sinusitis, unspecified: Secondary | ICD-10-CM

## 2023-01-14 MED ORDER — AMOXICILLIN 500 MG PO TABS
500.0000 mg | ORAL_TABLET | Freq: Two times a day (BID) | ORAL | 0 refills | Status: DC
Start: 2023-01-14 — End: 2023-04-27

## 2023-01-14 NOTE — Assessment & Plan Note (Addendum)
Symptoms consistent with acute sinusitis. Amoxicillin 500 mg BID x 7 days. Sinus rinses. Elevate to sleep to assist with post nasal drip. Complete antibiotic. Follow-up with PCP if symptoms do not resolve.

## 2023-01-14 NOTE — Patient Instructions (Signed)
Elevate to sleep to help with the sinus drainage.

## 2023-01-14 NOTE — Progress Notes (Signed)
Established Patient Office Visit  Subjective   Patient ID: [name] [name], male    DOB: March 21, 1941  Age: 82 y.o. MRN: [id number]  Chief Complaint  Patient presents with   Sore Throat    Post nasal drip/ with congestion onset 1 week     HPI  Presents today for an acute visit with complaint of sinus congestion and post nasal drip. Symptoms are worse while lying down.  Feels like throat is swollen.  Symptoms have been present  for one week.  Associated symptoms include: sinus pressure Pertinent negatives: no fever or chills Pain severity: 0/10  Treatments tried include : n/a Treatment effective : n/a Sick contacts : n/a   Review of Systems  Constitutional:  Negative for chills and fever.  HENT:  Positive for congestion and sinus pain. Negative for sore throat.   Respiratory:  Negative for shortness of breath.   Cardiovascular:  Negative for chest pain.     Chart review: CMP 09/23/22: GFR 71 Liver enzymes normal.  Objective:     BP 116/84   Pulse 99   Temp 98.3 F (36.8 C) (Oral)   Ht 5\' 7"  (1.702 m)   Wt 223 lb (101.2 kg)   SpO2 96%   BMI 34.93 kg/m  BP Readings from Last 3 Encounters:  01/14/23 116/84  12/24/22 125/78  12/21/22 119/70      Physical Exam Vitals and nursing note reviewed.  Constitutional:      General: He is not in acute distress.    Appearance: He is well-developed. He is not ill-appearing.  HENT:     Right Ear: Tympanic membrane normal.     Left Ear: Tympanic membrane normal.     Nose:     Right Turbinates: Swollen.     Left Turbinates: Swollen.     Right Sinus: Maxillary sinus tenderness present. No frontal sinus tenderness.     Left Sinus: Maxillary sinus tenderness present. No frontal sinus tenderness.     Mouth/Throat:     Pharynx: Oropharynx is clear. No pharyngeal swelling, oropharyngeal exudate or posterior oropharyngeal erythema.     Comments: Tonsils absent.  Cardiovascular:     Rate and Rhythm: Regular rhythm.     Heart  sounds: Normal heart sounds.  Pulmonary:     Effort: Pulmonary effort is normal.     Breath sounds: Normal breath sounds.  Skin:    General: Skin is warm and dry.  Neurological:     General: No focal deficit present.     Mental Status: He is alert. Mental status is at baseline.  Psychiatric:        Mood and Affect: Mood normal.        Behavior: Behavior normal.        Thought Content: Thought content normal.        Judgment: Judgment normal.     No results found for any visits on 01/14/23.    The ASCVD Risk score (Arnett DK, et al., 2019) failed to calculate for the following reasons:   The 2019 ASCVD risk score is only valid for ages 2 to 66   The patient has a prior MI or stroke diagnosis    Assessment & Plan:   Problem List Items Addressed This Visit     Acute non-recurrent maxillary sinusitis - Primary    Symptoms consistent with acute sinusitis. Amoxicillin 500 mg BID x 7 days. Sinus rinses. Elevate to sleep to assist with post nasal drip. Complete antibiotic. Follow-up  with PCP if symptoms do not resolve.        Relevant Medications   amoxicillin (AMOXIL) 500 MG tablet  Agrees with plan of care discussed.  Questions answered.   Return if symptoms worsen or fail to improve.    Novella Olive, FNP

## 2023-01-20 ENCOUNTER — Other Ambulatory Visit: Payer: Self-pay | Admitting: Neurology

## 2023-01-20 ENCOUNTER — Other Ambulatory Visit: Payer: Self-pay | Admitting: Internal Medicine

## 2023-01-20 ENCOUNTER — Telehealth: Payer: Self-pay | Admitting: Neurology

## 2023-01-20 DIAGNOSIS — K219 Gastro-esophageal reflux disease without esophagitis: Secondary | ICD-10-CM

## 2023-01-20 MED ORDER — MEMANTINE HCL 10 MG PO TABS: ORAL_TABLET | ORAL | 0 refills | Status: DC

## 2023-01-20 MED ORDER — MEMANTINE HCL 10 MG PO TABS: ORAL_TABLET | ORAL | 11 refills | Status: DC

## 2023-01-20 NOTE — Addendum Note (Signed)
Addended by: Bertram Savin on: 01/20/2023 04:54 PM   Modules accepted: Orders

## 2023-01-20 NOTE — Telephone Encounter (Signed)
Pt's, wife, [name] [name] (on [place]) last office visit Dr. Lucia Gaskins said would change to Namenda if he's doing well on the donepezil (ARICEPT) 10 MG tablet . We are taking him to see his family and want to change the medication before we leave in 6 days. Would like a call from the nurse.

## 2023-01-20 NOTE — Telephone Encounter (Signed)
Not change, they stay on the Namenda make sure they know that. Namenda/memantine is an add on. I will oder start with one pill at bedtime and if doing well can increase to twice daily

## 2023-01-20 NOTE — Telephone Encounter (Signed)
Take 1 capsule 2 x / day (every 12 hours) to Prevent Heartburn & Indigestion

## 2023-01-20 NOTE — Telephone Encounter (Signed)
Patient's wife and discussed directions for Namenda 10 mg p.o. I advised that patient is to take this in addition to Donepezil 10 mg at bedtime. She is aware that after 2 weeks if he is doing well they can increase to 1 tablet BID. Her questions were answered. She asked for the first fill to go to local pharmacy, [facility] on [address]. She verbalized appreciation for the call.

## 2023-01-21 ENCOUNTER — Telehealth: Payer: Self-pay | Admitting: Family Medicine

## 2023-01-21 DIAGNOSIS — Z23 Encounter for immunization: Secondary | ICD-10-CM | POA: Diagnosis not present

## 2023-01-21 DIAGNOSIS — G4709 Other insomnia: Secondary | ICD-10-CM

## 2023-01-21 DIAGNOSIS — K219 Gastro-esophageal reflux disease without esophagitis: Secondary | ICD-10-CM

## 2023-01-21 DIAGNOSIS — I480 Paroxysmal atrial fibrillation: Secondary | ICD-10-CM

## 2023-01-21 DIAGNOSIS — I255 Ischemic cardiomyopathy: Secondary | ICD-10-CM | POA: Diagnosis not present

## 2023-01-21 DIAGNOSIS — Z95810 Presence of automatic (implantable) cardiac defibrillator: Secondary | ICD-10-CM | POA: Diagnosis not present

## 2023-01-21 MED ORDER — RAMELTEON 8 MG PO TABS
8.0000 mg | ORAL_TABLET | Freq: Every day | ORAL | 0 refills | Status: DC
Start: 2023-01-21 — End: 2023-02-25

## 2023-01-21 MED ORDER — OMEPRAZOLE 20 MG PO CPDR
DELAYED_RELEASE_CAPSULE | ORAL | 3 refills | Status: DC
Start: 2023-01-21 — End: 2023-09-23

## 2023-01-21 NOTE — Telephone Encounter (Signed)
ramelteon (ROZEREM) 8 MG tablet  Patients wife states they are leaving to go out of town for 3 weeks and will need this called in to [facility]. It says on the bottle that it needs an [place]

## 2023-01-21 NOTE — Telephone Encounter (Signed)
Sent Rozerem 8mg  to [facility].

## 2023-01-21 NOTE — Telephone Encounter (Signed)
I've refilled Omeprazole to [facility]

## 2023-01-21 NOTE — Addendum Note (Signed)
Addended by: Suzan Slick on: 01/21/2023 04:22 PM   Modules accepted: Orders

## 2023-01-21 NOTE — Telephone Encounter (Signed)
Patient forgot to ask for a refill on this as well before leaving to go out of town omeprazole (PRILOSEC) 20 MG

## 2023-02-02 DIAGNOSIS — L03114 Cellulitis of left upper limb: Secondary | ICD-10-CM | POA: Diagnosis not present

## 2023-02-02 DIAGNOSIS — R5383 Other fatigue: Secondary | ICD-10-CM | POA: Diagnosis not present

## 2023-02-02 DIAGNOSIS — Z23 Encounter for immunization: Secondary | ICD-10-CM | POA: Diagnosis not present

## 2023-02-07 NOTE — Progress Notes (Deleted)
Cardiology Office Note:    Date:  02/07/2023   ID:  [name] [name], DOB [birth year], MRN [id number]  PCP:  Suzan Slick, MD  Cardiologist:  None  Electrophysiologist:  Will Jorja Loa, MD   Referring MD: Suzan Slick, MD   Chief Complaint: follow-up of CAD, CHF, and atrial fibrillation  History of Present Illness:    [name] [name] is a 82 y.o. male with a history of CAD s/p CABG in 2015, ischemic cardiomyopathy/ chronic combined CHF with normalization of EF, s/p [employer] BIV ICD (CRT-D) in 07/2014 for primary prevention of sudden cardiac death, permanent atrial fibrillation on Eliquis, aortic stenosis, hypertension, hyperlipidemia, GERD, and obstructive sleep apnea who is followed by Dr. Jens Som and presents today for routine follow-up.   Patient was previously followed by Dr. Boneta Lucks at [facility] and now follows with Dr. Jens Som. Per chart review in Care Everywhere, he has a history of CAD s/p CABG in 01/2014 and ischemic cardiomyopathy/ chronic combined CHF with EF as low as 35-40% in the past. He underwent placement of BiV ICD in 07/2014  for primary prevention of sudden cardiac death. EF has subsequently normalized. He also has permanent atrial fibrillation and is on Coumadin. Last ischemiec evaluation was as Dobutamine stress Echo in 11/2019 at [facility] for further evaluation of aortic stenosis but this was non-diagnostic and was suspected to represent midl to moderate aortic stenosis.   Patient was last seen by Dr. Jens Som in 05/2022 at which time he reported dyspnea with more vigorous activities but none with routine activities. Plan was for a repeat Echo in 03/2023 for routine monitoring of valvular disease. However, he was then admitted in 09/2022 with an acute CVA. Echo at that time showed LVEF of 50-55% with normal wall motion, moderate LVH, and grade 1 diastolic dysfunction as well as severely enlarged RV with normal function, severe biatrial enlargement, moderate  low flow low gradient AS (mean gradient 25 mmHg), mild TR, and trivial MR. Neurology was consulted and his Coumadin was changed to Eliquis and he was started on Aspirin as well.  Patient presents today for follow-up. ***  CAD s/p CABG Patient has a history of CAD s/p CABG in 2015.  - No chest pain. - Continue Aspirin 81mg  daily. This was added in 09/2022 after stroke by Neurology. - Continue statin and Zetia.  Chronic Combined CHF Ischemic Cardiomyopathy Patient has a history of ischemic cardiomyopathy with EF as low as 35-40% in the past. However, EF has subsequently normalized. Last Echo in in 09/2022 showed LVEF of 50-55% with normal wall motion, moderate LVH, and grade 1 diastolic dysfunction. - Euvolemic on exam. - Previously on Torsemide 20mg  daily but this is no longer on his medications list. *** - Continue Toprol-XL 200mg  daily.  Permanent Atrial Fibrillation Rates controlled. - Continue Toprol-XL 200mg  daily. - Continue Eliquis 5mg  twice daily.   S/p ICD S/p [employer] Scientific ICD (CRT-D) in 07/2014 for primary prevention of sudden cardiac death given ischemic cardiomyopathy. - Followed by Dr. Elberta Fortis.   Aortic Stenosis Echo in 09/2022 showed suspected moderate low flow low gradient AS as well as mild AI. - Can continue to monitor with routine serial Echos. Recommend repeat Echo in 09/2023. Can order at next visit.  Hypertension BP well controlled. *** - Continue Toprol-XL as above.  Hyperlipidemia Lipid panel in 09/2022: Total Cholesterol 147, Triglycerides 122, HDL 23, LDL 100. LDL goal <55 given CAD and CVA. - Continue Crestor 40mg  daily and Zetia 10mg  daily.  -  Will refer to Lipid Clinic for consideration of PCSK9 inhibitor. ***  EKGs/Labs/Other Studies Reviewed:    The following studies were reviewed:  Echocardiogram 09/17/2022: Impressions: 1. Left ventricular ejection fraction, by estimation, is 50 to 55%. The  left ventricle has low normal function. The left  ventricle has no regional  wall motion abnormalities. There is moderate concentric left ventricular  hypertrophy. Left ventricular  diastolic parameters are consistent with Grade I diastolic dysfunction  (impaired relaxation).   2. Right ventricular systolic function is normal. The right ventricular  size is severely enlarged. There is normal pulmonary artery systolic  pressure. The estimated right ventricular systolic pressure is 31.6 mmHg.   3. Left atrial size was severely dilated.   4. Right atrial size was severely dilated.   5. The mitral valve is degenerative. Trivial mitral valve regurgitation.   6. Decreased LV stroke volume index. DVI 0.33. Suspect moderate low flow  low gradient aortic stenosis. The aortic valve is calcified. Aortic valve  regurgitation is mild. Moderate aortic valve stenosis. Aortic valve mean  gradient measures 25.0 mmHg.  Aortic valve Vmax measures 3.11 m/s.   7. The inferior vena cava is dilated in size with <50% respiratory  variability, suggesting right atrial pressure of 15 mmHg.    EKG:  EKG not ordered today.   Recent Labs: 09/16/2022: Magnesium 1.7; TSH 1.717 09/23/2022: ALT 44; BUN 14; Creatinine, Ser 1.05; Hemoglobin 15.1; Platelets 486; Potassium 5.3; Sodium 136  Recent Lipid Panel    Component Value Date/Time   CHOL 147 09/17/2022 0657   TRIG 122 09/17/2022 0657   HDL 23 (L) 09/17/2022 0657   CHOLHDL 6.4 09/17/2022 0657   VLDL 24 09/17/2022 0657   LDLCALC 100 (H) 09/17/2022 0657   LDLCALC 84 10/16/2021 1225    Physical Exam:    Vital Signs: There were no vitals taken for this visit.    Wt Readings from Last 3 Encounters:  01/14/23 223 lb (101.2 kg)  12/24/22 224 lb 11.2 oz (101.9 kg)  12/11/22 226 lb (102.5 kg)     General: 82 y.o. male in no acute distress. HEENT: Normocephalic and atraumatic. Sclera clear. EOMs intact. Neck: Supple. No carotid bruits. No JVD. Heart: *** RRR. Distinct S1 and S2. No murmurs, gallops, or rubs.  Radial and distal pedal pulses 2+ and equal bilaterally. Lungs: No increased work of breathing. Clear to ausculation bilaterally. No wheezes, rhonchi, or rales.  Abdomen: Soft, non-distended, and non-tender to palpation. Bowel sounds present in all 4 quadrants.  MSK: Normal strength and tone for age. *** Extremities: No lower extremity edema.    Skin: Warm and dry. Neuro: Alert and oriented x3. No focal deficits. Psych: Normal affect. Responds appropriately.   Assessment:    No diagnosis found.  Plan:     Disposition: Follow up in ***   Medication Adjustments/Labs and Tests Ordered: Current medicines are reviewed at length with the patient today.  Concerns regarding medicines are outlined above.  No orders of the defined types were placed in this encounter.  No orders of the defined types were placed in this encounter.   There are no Patient Instructions on file for this visit.   Signed, Corrin Parker, PA-C  02/07/2023 5:38 PM    [facility]

## 2023-02-18 ENCOUNTER — Ambulatory Visit: Payer: Medicare Other | Admitting: Student

## 2023-02-19 ENCOUNTER — Other Ambulatory Visit: Payer: Self-pay

## 2023-02-19 DIAGNOSIS — D45 Polycythemia vera: Secondary | ICD-10-CM

## 2023-02-20 ENCOUNTER — Inpatient Hospital Stay: Payer: Medicare Other | Admitting: Hematology

## 2023-02-20 ENCOUNTER — Inpatient Hospital Stay: Payer: Medicare Other | Attending: Hematology

## 2023-02-20 VITALS — BP 135/75 | HR 70 | Temp 97.5°F | Resp 17 | Wt 233.2 lb

## 2023-02-20 DIAGNOSIS — D72829 Elevated white blood cell count, unspecified: Secondary | ICD-10-CM | POA: Diagnosis not present

## 2023-02-20 DIAGNOSIS — Z7982 Long term (current) use of aspirin: Secondary | ICD-10-CM | POA: Diagnosis not present

## 2023-02-20 DIAGNOSIS — Z87891 Personal history of nicotine dependence: Secondary | ICD-10-CM | POA: Insufficient documentation

## 2023-02-20 DIAGNOSIS — Z7964 Long term (current) use of myelosuppressive agent: Secondary | ICD-10-CM | POA: Insufficient documentation

## 2023-02-20 DIAGNOSIS — Z79899 Other long term (current) drug therapy: Secondary | ICD-10-CM | POA: Diagnosis not present

## 2023-02-20 DIAGNOSIS — D45 Polycythemia vera: Secondary | ICD-10-CM

## 2023-02-20 LAB — CMP (CANCER CENTER ONLY)
ALT: 18 U/L (ref 0–44)
AST: 22 U/L (ref 15–41)
Albumin: 4.1 g/dL (ref 3.5–5.0)
Alkaline Phosphatase: 57 U/L (ref 38–126)
Anion gap: 6 (ref 5–15)
BUN: 17 mg/dL (ref 8–23)
CO2: 33 mmol/L — ABNORMAL HIGH (ref 22–32)
Calcium: 9.4 mg/dL (ref 8.9–10.3)
Chloride: 100 mmol/L (ref 98–111)
Creatinine: 1.03 mg/dL (ref 0.61–1.24)
GFR, Estimated: >60 mL/min (ref >60)
Glucose, Bld: 147 mg/dL — ABNORMAL HIGH (ref 70–99)
Potassium: 3.6 mmol/L (ref 3.5–5.1)
Sodium: 139 mmol/L (ref 135–145)
Total Bilirubin: 1.4 mg/dL — ABNORMAL HIGH (ref 0.3–1.2)
Total Protein: 6.6 g/dL (ref 6.5–8.1)

## 2023-02-20 LAB — CBC WITH DIFFERENTIAL (CANCER CENTER ONLY)
Abs Immature Granulocytes: 0.14 10*3/uL — ABNORMAL HIGH (ref 0.00–0.07)
Basophils Absolute: 0.1 10*3/uL (ref 0.0–0.1)
Basophils Relative: 1 %
Eosinophils Absolute: 0.1 10*3/uL (ref 0.0–0.5)
Eosinophils Relative: 1 %
HCT: 48.5 % (ref 39.0–52.0)
Hemoglobin: 15.5 g/dL (ref 13.0–17.0)
Immature Granulocytes: 1 %
Lymphocytes Relative: 10 %
Lymphs Abs: 1.2 10*3/uL (ref 0.7–4.0)
MCH: 33.1 pg (ref 26.0–34.0)
MCHC: 32 g/dL (ref 30.0–36.0)
MCV: 103.6 fL — ABNORMAL HIGH (ref 80.0–100.0)
Monocytes Absolute: 0.4 10*3/uL (ref 0.1–1.0)
Monocytes Relative: 3 %
Neutro Abs: 9.6 10*3/uL — ABNORMAL HIGH (ref 1.7–7.7)
Neutrophils Relative %: 84 %
Platelet Count: 324 10*3/uL (ref 150–400)
RBC: 4.68 MIL/uL (ref 4.22–5.81)
RDW: 16.4 % — ABNORMAL HIGH (ref 11.5–15.5)
WBC Count: 11.5 10*3/uL — ABNORMAL HIGH (ref 4.0–10.5)
nRBC: 0 % (ref 0.0–0.2)

## 2023-02-20 NOTE — Progress Notes (Signed)
HEMATOLOGY/ONCOLOGY CLINIC VISIT NOTE  Date of Service: 02/20/23   Patient Care Team: Suzan Slick, MD as PCP - General (Family Medicine) Regan Lemming, MD as PCP - Electrophysiology (Cardiology) Mayme Genta, Theotis Barrio, MD as Referring Physician (Cardiology) Boneta Lucks Olga Coaster, MD as Referring Physician (Cardiology) Johney Maine, MD as Consulting Physician (Hematology) Lajoyce Corners, [facility] (Inactive) as Pharmacist (Pharmacist) Dohmeier, Porfirio Mylar, MD as Consulting Physician (Neurology)  CHIEF COMPLAINT F/u for continued evaluation and mx of polycythemia vera  HISTORY OF PRESENTING ILLNESS:  Please see previous note for details on initial presentation  INTERVAL HISTORY:   [name] [name] is here for continued evaluation and management of [name]. Patient was last seen by me on 09/16/2022 and endorsed significant memory loss, worsened brain fog, increased sleeping habits, fall with difficulty standing back up, chronic ulcer of left foot, stable difficulty breathing, weight loss, intermittent constipation, and sleep disturbances due to urinary frequency.  Today, he is accompanied by his wife. He denies any new concerns since his last visit and has been doing well since his hospitalization.   His wife reports that the patient continues to have some mild memory retention issues depending on what he may be able to recognize. For example, when asked to pick up a certain object such as a glass, he may pick up ketchup.   Patient's wife does regularly keep him engaged with practicing word recognition. They regularly read summaries together from television programming. She reports that he previously did not recognize 7-8 words out of 100 from the information summaries, but he currently only has issues recognizing 5 words out of 100.   She adds that he is able to engage in brain teasers independently occasionally, though it is challenging.   Patient  denies any abdominal pain or leg swelling. He has gained 9 pounds in the last two months. His water intake is sufficient though it is not as much as previously.  He notes that he does have much memory from around the time of his hospitalization. Patient reports that he has otherwise back to normal since his hospitalization. No concern for falls since out of hospital.   Patient denies any weakness in his hands or feet. He continues to drive within a 5-mile radius. His wife reports that there are considerations of taking a cognitive driver's test for dementia, to evaluate whether he may be able to drive for further distances.   Patient has been taking Hydroxyurea regularly and denies any new or major toxicities.   Patient is no longer on Amoxicillin. He has recently been started on Memantine. Patient's wife reports that neurologist felt that it was okay to discontinue Aspirin 81 MG.  MEDICAL HISTORY:  Past Medical History:  Diagnosis Date   A-fib (HCC) 01/2014   AICD (automatic cardioverter/defibrillator) present    [employer]   Anal fissure 11/10/2019   Arthritis    ASHD (arteriosclerotic heart disease) 2015   s/p CABG   Cataract    s/p left   CHF (congestive heart failure) (HCC)    COPD (chronic obstructive pulmonary disease) (HCC)    by CXR, pt unsure of this   Diverticulosis    GERD (gastroesophageal reflux disease)    Gout 2014   Hyperlipidemia    Hypertension    Hypothyroidism    LBBB (left bundle branch block)    Polycythemia vera (HCC)    Prediabetes 01/2014   Presence of permanent cardiac pacemaker    Sleep apnea  no CPAP    SURGICAL HISTORY: Past Surgical History:  Procedure Laterality Date   CARDIAC DEFIBRILLATOR PLACEMENT  07/2014   CATARACT EXTRACTION W/ INTRAOCULAR LENS IMPLANT Left    COLONOSCOPY  08/26/2022   normal   CORONARY ARTERY BYPASS GRAFT  11/2013   ESOPHAGOGASTRODUODENOSCOPY  08/26/2022   esophageal dilation   lumb laminectomy   [phone]   x 3   LUMBAR FUSION  2013   NASAL SEPTOPLASTY W/ TURBINOPLASTY Bilateral 01/30/2016   Procedure: NASAL SEPTOPLASTY WITH BILATERAL TURBINATE REDUCTION;  Surgeon: Drema Halon, MD;  Location: [facility] OR;  Service: ENT;  Laterality: Bilateral;   TONSILLECTOMY     UVULECTOMY N/A 01/30/2016   Procedure: Alfredo Martinez;  Surgeon: Drema Halon, MD;  Location: [facility] OR;  Service: ENT;  Laterality: N/A;    SOCIAL HISTORY: Social History   Socioeconomic History   Marital status: Married    Spouse name: [name] [name]    Number of children: 2   Years of education: 13   Highest education level: Not on file  Occupational History   Occupation: Retired  Tobacco Use   Smoking status: Former    Current packs/day: 0.00    Average packs/day: 2.0 packs/day for 25.0 years (50.0 ttl pk-yrs)    Types: Cigarettes    Start date: 05/19/1958    Quit date: 05/20/1983    Years since quitting: 39.7   Smokeless tobacco: Never  Vaping Use   Vaping status: Never Used  Substance and Sexual Activity   Alcohol use: Never   Drug use: Never   Sexual activity: Not Currently  Other Topics Concern   Not on file  Social History Narrative   Lives with wife, [name] [name]   Caffeine use: daily (Coffee)   Social Determinants of Health   Financial Resource Strain: Unknown (07/28/2019)   [facility] [employer] [facility]    5. Do you ever have problems being able to afford everything you need?: Not on file    4. Over the last 6 months, have you had trouble paying your heating or electricity bill?: Not on file  Food Insecurity: No Food Insecurity (06/05/2021)   Received from [facility], [facility]   Hunger Vital Sign    Worried About Running Out of Food in the Last Year: Never true    Ran Out of Food in the Last Year: Never true  Transportation Needs: No Transportation Needs (03/29/2021)   PRAPARE - Administrator, Civil Service (Medical): No    Lack  of Transportation (Non-Medical): No  Physical Activity: Not on file  Stress: Not on file  Social Connections: Unknown (09/18/2021)   Received from [facility], [facility]   Social Network    Social Network: Not on file  Intimate Partner Violence: Unknown (08/20/2021)   Received from [facility], [facility]   HITS    Physically Hurt: Not on file    Insult or Talk Down To: Not on file    Threaten Physical Harm: Not on file    Scream or Curse: Not on file    FAMILY HISTORY: Family History  Problem Relation Age of Onset   Alzheimer's disease Mother    Mental illness Mother    Depression Mother    Heart disease Father 49       had 5 MI's & died 51 yo   Hypertension Father    Alcohol abuse Son    Heart attack Son  Stroke Maternal Grandmother    Dementia Maternal Grandmother    Cancer - Prostate Other    Colon cancer Neg Hx    Stomach cancer Neg Hx    Esophageal cancer Neg Hx     ALLERGIES:  is allergic to other.  MEDICATIONS:  Current Outpatient Medications  Medication Sig Dispense Refill   acetaminophen (TYLENOL) 500 MG tablet Take 500 mg by mouth as needed for mild pain.     ALLOPURINOL PO Take 300 mg by mouth daily.     amoxicillin (AMOXIL) 500 MG tablet Take 1 tablet (500 mg total) by mouth 2 (two) times daily. 14 tablet 0   apixaban (ELIQUIS) 5 MG TABS tablet Take 1 tablet (5 mg total) by mouth 2 (two) times daily. 180 tablet 1   aspirin EC 81 MG tablet Take 81 mg by mouth daily.     Cyanocobalamin (B-12) 1000 MCG CAPS Take 1 capsule by mouth daily.     Docosahexaenoic Acid (DHA OMEGA 3 PO) Take 1 tablet by mouth daily.     docusate sodium (COLACE) 100 MG capsule Take 100 mg by mouth 2 (two) times daily. 1 tablet in the morning and 1 tablet at bedtime (stool softener)     donepezil (ARICEPT) 10 MG tablet Take 1 tablet (10 mg total) by mouth at bedtime. 90 tablet 4   ezetimibe (ZETIA) 10 MG tablet Take 1 tablet (10 mg total) by mouth daily. 30 tablet 1    finasteride (PROSCAR) 5 MG tablet Take 1 tablet (5 mg total) by mouth daily. 90 tablet 1   levothyroxine (SYNTHROID) 200 MCG tablet TAKE 1 TAB DAILY ON EMPTY STOMACH WITH ONLY WATER FOR 30 MINS. NO ANTACIDS, CALCIUM OR MAGNESIUM FOR 4 HOURS. AVOID BIOTIN. 90 tablet 1   magnesium oxide (MAG-OX) 400 MG tablet Take 400 mg by mouth daily.     MELATONIN PO Take 1 tablet by mouth daily as needed (For sleep).     memantine (NAMENDA) 10 MG tablet Start with one tab at bedtime. In 2 weeks if doing well can increase to 1 tab twice daily. 60 tablet 0   metoprolol (TOPROL-XL) 200 MG 24 hr tablet Take 200 mg by mouth daily.     Multiple Vitamin (MULTIVITAMIN) tablet Take 1 tablet by mouth daily.     omeprazole (PRILOSEC) 20 MG capsule TAKE 1 CAPSULE TWICE DAILY  ( EVERY 12 HOURS  ) TO PREVENT HEARTBURN AND ACID REFLUX 180 capsule 3   Probiotic Product (PROBIOTIC-10 PO) Take 1 tablet by mouth daily.     ramelteon (ROZEREM) 8 MG tablet Take 1 tablet (8 mg total) by mouth at bedtime. 30 tablet 0   rosuvastatin (CRESTOR) 40 MG tablet Take 1 tablet (40 mg total) by mouth daily. 90 tablet 3   UNABLE TO FIND Med Name: Tumeric     No current facility-administered medications for this visit.    REVIEW OF SYSTEMS:    10 Point review of Systems was done is negative except as noted above.   PHYSICAL EXAMINATION:  .There were no vitals taken for this visit.   GENERAL:alert, in no acute distress and comfortable SKIN: no acute rashes, no significant lesions EYES: conjunctiva are pink and non-injected, sclera anicteric OROPHARYNX: MMM, no exudates, no oropharyngeal erythema or ulceration NECK: supple, no JVD LYMPH:  no palpable lymphadenopathy in the cervical, axillary or inguinal regions LUNGS: clear to auscultation b/l with normal respiratory effort HEART: regular rate & rhythm ABDOMEN:  normoactive bowel sounds , non  tender, not distended. Extremity: no pedal edema PSYCH: alert & oriented x 3 with fluent  speech NEURO: no focal motor/sensory deficits   LABORATORY DATA:  I have reviewed the data as listed  .    Latest Ref Rng & Units 09/23/2022    3:16 PM 09/18/2022    6:56 AM 09/16/2022   12:23 PM  CBC  WBC 3.4 - 10.8 x10E3/uL 10.4  10.8  13.1   Hemoglobin 13.0 - 17.7 g/dL 21.3  08.6  57.8   Hematocrit 37.5 - 51.0 % 43.0  45.2  48.5   Platelets 150 - 450 x10E3/uL 486  363  404     .    Latest Ref Rng & Units 09/23/2022    3:16 PM 09/18/2022    6:56 AM 09/16/2022   12:23 PM  CMP  Glucose 70 - 99 mg/dL 469  629  528   BUN 8 - 27 mg/dL 14  14  12    Creatinine 0.76 - 1.27 mg/dL 4.13  2.44  0.10   Sodium 134 - 144 mmol/L 136  134  135   Potassium 3.5 - 5.2 mmol/L 5.3  3.9  4.0   Chloride 96 - 106 mmol/L 95  99  99   CO2 20 - 29 mmol/L 27  26  31    Calcium 8.6 - 10.2 mg/dL 9.5  9.1  9.8   Total Protein 6.0 - 8.5 g/dL 6.7   6.7   Total Bilirubin 0.0 - 1.2 mg/dL 1.1   1.5   Alkaline Phos 44 - 121 IU/L 77   71   AST 0 - 40 IU/L 33   37   ALT 0 - 44 IU/L 44   53     02/09/18 JAK2 Mutation Study:    02/09/18 BCR ABL FISH:    RADIOGRAPHIC STUDIES: I have personally reviewed the radiological images as listed and agreed with the findings in the report. No results found.  ASSESSMENT & PLAN:   82 y.o. male with  1. 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 Some associated leukocytosis. No history of splenectomy. No previous h/o VTE, but significant cardiac risk factors. 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   02/09/18 BCR ABL FISH revealed no evidence of BCR/ABL rearrangement   2. . 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) 12/15/2022   Acute CVA (cerebrovascular accident) (HCC) 09/16/2022   Chronic diastolic CHF (congestive heart failure) (HCC) 09/16/2022   Encounter for screening colonoscopy 08/26/2022   Chronic ulcer of left foot with fat layer exposed (HCC) 07/10/2022   Zenker's diverticulum 10/16/2021   Loss of appetite 10/10/2021   Loss of weight 10/10/2021   Generalized abdominal pain 10/10/2021   Thrombosed external hemorrhoid 10/10/2021   Dysphagia 08/01/2021   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   Intolerance of continuous positive airway pressure (CPAP) ventilation 05/02/2021   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   JAK2 V617F mutation 08/27/2020   Rectal pain 11/10/2019   Constipation 11/10/2019   Abnormal glucose 10/12/2019   Memory changes 01/10/2019   Polycythemia vera (  HCC) 10/21/2018   Thrombocytosis 12/29/2017   Senile purpura (HCC) 09/01/2017   Emphysema of lung (HCC) 06/01/2017   Tortuous aorta (HCC)- CXR 2018 06/01/2017   Regular astigmatism, right eye 10/16/2016   Combined forms of age-related cataract of right eye 10/16/2016   Trigger ring finger of left hand 08/06/2016   Bilateral carpal tunnel syndrome 08/06/2016   Ischemic cardiomyopathy 06/22/2015   ASHD/CABG x 3V (11/2013) 02/06/2015   Acquired thrombophilia (HCC) 02/06/2015   Cardiac pacemaker/AICD (01/2014) 02/06/2015   Morbid obesity (HCC) 01/03/2015   HTN (hypertension) 01/02/2015   Hyperlipidemia 01/02/2015   Vitamin D deficiency 01/02/2015   GERD  01/02/2015   BPH (benign prostatic hyperplasia) 01/02/2015   Medication management 01/02/2015   Atrial fibrillation (HCC) 01/02/2015   Hypothyroidism 01/02/2015   OSA and COPD overlap syndrome (HCC) 01/02/2015   Chronic systolic (congestive) heart failure (HCC) 02/17/2014   S/P CABG x 3 02/13/2014   Atherosclerotic  heart disease of native coronary artery without angina pectoris 02/08/2014   Gout 02/08/2014   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) 02/06/2014  -continue f/u with PCP to optimize atherosclerotic risk factors.  PLAN:  -Discussed lab results on 02/20/23 in detail with patient. CBC normal, showed WBC of 11.5K, hemoglobin of 15.5, and platelets of 324K. -platelets are normal -hemoglobin/hematocrit stable. Hemoglobin 15.5 g/dL and hematocrit 16.1% -JAK2 positive polycythemia vera stable -Patient has no current indication for therapeutic phlebotomy at this time -No notable toxicity from his current dose of hydroxyurea -He will continue his hydroxyurea at 1000 mg p.o. daily Monday to through Friday and 500mg  po daily on Saturday and sunday -patient continues to have some mild memory-retaining issues. He denies any weakness in his hands/feet.  -okay from my standpoint to discontinue Aspirin 81 MG if it is okay with neurology -will continue to monitor with labs in 4 months  FOLLOW-UP: RTC with Dr Candise Che with labs in 4 months  The total time spent in the appointment was *** minutes* .  All of the patient's questions were answered with apparent satisfaction. The patient knows to call the clinic with any problems, questions or concerns.   Wyvonnia Lora MD MS AAHIVMS [facility] Hematology/Oncology Physician [facility]  .*Total Encounter Time as defined by the Centers for Medicare and Medicaid Services includes, in addition to the face-to-face time of a patient visit (documented in the note above) non-face-to-face time: obtaining and reviewing outside history, ordering and reviewing medications, tests or procedures, care coordination (communications with other health care professionals or caregivers) and documentation in the medical record.    I,Mitra Faeizi,acting as a Neurosurgeon for Wyvonnia Lora, MD.,have documented all relevant documentation on the behalf of Wyvonnia Lora,  MD,as directed by  Wyvonnia Lora, MD while in the presence of Wyvonnia Lora, MD.  ***

## 2023-02-25 ENCOUNTER — Other Ambulatory Visit: Payer: Self-pay

## 2023-02-25 ENCOUNTER — Telehealth: Payer: Self-pay | Admitting: Family Medicine

## 2023-02-25 DIAGNOSIS — I480 Paroxysmal atrial fibrillation: Secondary | ICD-10-CM

## 2023-02-25 DIAGNOSIS — G4709 Other insomnia: Secondary | ICD-10-CM

## 2023-02-25 MED ORDER — RAMELTEON 8 MG PO TABS: 8.0000 mg | ORAL_TABLET | Freq: Every day | ORAL | 0 refills | Status: DC

## 2023-02-25 NOTE — Telephone Encounter (Signed)
Prescription Request  02/25/2023  LOV: 12/24/2022  What is the name of the medication or equipment?  ramelteon (ROZEREM) 8 MG tablet  Have you contacted your pharmacy to request a refill? Yes   Which pharmacy would you like this sent to?   [facility] - [place], [place] - [address] [place] [address] Phone: [phone] Fax: [phone]   Patient notified that their request is being sent to the clinical staff for review and that they should receive a response within 2 business days.   Please advise at Mobile [phone] (mobile)

## 2023-02-25 NOTE — Telephone Encounter (Signed)
Medication sent to pharmacy requested in telephone note:   [facility] - [address], [place] - [address] [place] [address] Phone: [phone] Fax: [phone]

## 2023-02-26 ENCOUNTER — Encounter: Payer: Self-pay | Admitting: Hematology

## 2023-03-27 ENCOUNTER — Telehealth: Payer: Self-pay | Admitting: Family Medicine

## 2023-03-27 DIAGNOSIS — I480 Paroxysmal atrial fibrillation: Secondary | ICD-10-CM

## 2023-03-27 DIAGNOSIS — G4709 Other insomnia: Secondary | ICD-10-CM

## 2023-03-27 MED ORDER — RAMELTEON 8 MG PO TABS: 8.0000 mg | ORAL_TABLET | Freq: Every day | ORAL | 2 refills | Status: DC

## 2023-03-27 NOTE — Telephone Encounter (Signed)
Prescription Request  03/27/2023  LOV: 12/24/2022  What is the name of the medication or equipment?  ramelteon (ROZEREM) 8 MG tablet  Have you contacted your pharmacy to request a refill? Yes   Which pharmacy would you like this sent to?  [facility] - [place], [place] - [address] [place] [address] Phone: [phone] Fax: [phone]    Patient notified that their request is being sent to the clinical staff for review and that they should receive a response within 2 business days.   Please advise at Mobile [phone] (mobile)

## 2023-03-27 NOTE — Telephone Encounter (Signed)
Refilled requested medicine to [facility] field.

## 2023-04-27 ENCOUNTER — Ambulatory Visit (INDEPENDENT_AMBULATORY_CARE_PROVIDER_SITE_OTHER): Payer: Medicare Other | Admitting: Family Medicine

## 2023-04-27 ENCOUNTER — Encounter: Payer: Self-pay | Admitting: Family Medicine

## 2023-04-27 VITALS — BP 128/71 | HR 69 | Temp 97.6°F | Resp 18 | Ht 67.0 in | Wt 225.8 lb

## 2023-04-27 DIAGNOSIS — J0100 Acute maxillary sinusitis, unspecified: Secondary | ICD-10-CM | POA: Diagnosis not present

## 2023-04-27 DIAGNOSIS — I255 Ischemic cardiomyopathy: Secondary | ICD-10-CM | POA: Diagnosis not present

## 2023-04-27 DIAGNOSIS — Z95810 Presence of automatic (implantable) cardiac defibrillator: Secondary | ICD-10-CM | POA: Diagnosis not present

## 2023-04-27 MED ORDER — CEPHALEXIN 500 MG PO CAPS
500.0000 mg | ORAL_CAPSULE | Freq: Two times a day (BID) | ORAL | 0 refills | Status: DC
Start: 2023-04-27 — End: 2023-07-16

## 2023-04-27 NOTE — Progress Notes (Signed)
a  Acute Office Visit  Subjective:     Patient ID: [name] [name], male    DOB: [birth year], 82 y.o.   MRN: [id number]  Chief Complaint  Patient presents with   Nasal Congestion    Patient states that for the past two days he has had really bad head and chest congestion. He states that its hard for him to sleep at night due to the congestion. He states that he has not had a fever or any body chills or aches. His cough has been productive    HPI Patient is in today for acute visit.  Cough Pt reports for the last 2 days, he's had nasal and chest congestion. He also reports throat irritation. They have been doing cough medicine and chloraseptic spray for the throat pain. This seems to help some. No fevers or chills. Still eating well.   Review of Systems  HENT:  Positive for congestion and sinus pain.   Respiratory:  Positive for cough.   All other systems reviewed and are negative.      Objective:    BP 128/71   Pulse 69   Temp 97.6 F (36.4 C) (Oral)   Resp 18   Ht 5\' 7"  (1.702 m)   Wt 225 lb 12.8 oz (102.4 kg)   SpO2 98%   BMI 35.37 kg/m  BP Readings from Last 3 Encounters:  04/27/23 128/71  02/20/23 135/75  01/14/23 116/84      Physical Exam Vitals and nursing note reviewed.  Constitutional:      Appearance: Normal appearance. He is normal weight.  HENT:     Head: Normocephalic and atraumatic.     Right Ear: Tympanic membrane, ear canal and external ear normal.     Left Ear: Tympanic membrane, ear canal and external ear normal.     Nose: Nose normal.     Comments: Boggy nasal turbinates bilaterally    Mouth/Throat:     Mouth: Mucous membranes are moist.     Pharynx: Oropharynx is clear.  Eyes:     Conjunctiva/sclera: Conjunctivae normal.     Pupils: Pupils are equal, round, and reactive to light.  Cardiovascular:     Rate and Rhythm: Normal rate and regular rhythm.     Pulses: Normal pulses.     Heart sounds: Normal heart sounds.  Pulmonary:      Effort: Pulmonary effort is normal.     Breath sounds: Normal breath sounds.  Skin:    General: Skin is warm.     Capillary Refill: Capillary refill takes less than 2 seconds.  Neurological:     General: No focal deficit present.     Mental Status: He is alert and oriented to person, place, and time. Mental status is at baseline.  Psychiatric:        Mood and Affect: Mood normal.        Behavior: Behavior normal.        Thought Content: Thought content normal.        Judgment: Judgment normal.   No results found for any visits on 04/27/23.      Assessment & Plan:   Problem List Items Addressed This Visit   None   No orders of the defined types were placed in this encounter. Acute non-recurrent maxillary sinusitis -     Cephalexin; Take 1 capsule (500 mg total) by mouth 2 (two) times daily.  Dispense: 14 capsule; Refill: 0   Symptoms consistent with sinusitis. Treat with  Keflex 500mg  BID.    No follow-ups on file.  Suzan Slick, MD

## 2023-05-11 ENCOUNTER — Other Ambulatory Visit: Payer: Self-pay | Admitting: Family Medicine

## 2023-05-14 ENCOUNTER — Other Ambulatory Visit: Payer: Self-pay | Admitting: Family Medicine

## 2023-05-14 DIAGNOSIS — E782 Mixed hyperlipidemia: Secondary | ICD-10-CM

## 2023-05-15 ENCOUNTER — Other Ambulatory Visit: Payer: Self-pay | Admitting: Family Medicine

## 2023-05-28 ENCOUNTER — Telehealth: Payer: Self-pay | Admitting: Hematology

## 2023-05-28 NOTE — Telephone Encounter (Signed)
 Rescheduled appointments per provider on call. Patient was made aware of changes via voicemail and will be mailed an appointment reminder.

## 2023-05-29 ENCOUNTER — Other Ambulatory Visit: Payer: Self-pay | Admitting: Family Medicine

## 2023-06-09 ENCOUNTER — Encounter: Payer: Self-pay | Admitting: Adult Health

## 2023-06-09 ENCOUNTER — Telehealth: Payer: Self-pay | Admitting: Adult Health

## 2023-06-09 NOTE — Telephone Encounter (Signed)
Reschedule appt on 09/23/23 at 1:00 pm

## 2023-06-09 NOTE — Telephone Encounter (Signed)
LVM and sent letter in mail informing pt of need to reschedule 06/22/23 appt - NP schedule change

## 2023-06-10 ENCOUNTER — Ambulatory Visit (INDEPENDENT_AMBULATORY_CARE_PROVIDER_SITE_OTHER): Payer: Medicare Other | Admitting: Family Medicine

## 2023-06-10 ENCOUNTER — Encounter: Payer: Self-pay | Admitting: Family Medicine

## 2023-06-10 VITALS — BP 123/77 | HR 73 | Temp 98.0°F | Resp 18 | Ht 67.0 in | Wt 230.2 lb

## 2023-06-10 DIAGNOSIS — L853 Xerosis cutis: Secondary | ICD-10-CM | POA: Diagnosis not present

## 2023-06-10 DIAGNOSIS — Z136 Encounter for screening for cardiovascular disorders: Secondary | ICD-10-CM | POA: Diagnosis not present

## 2023-06-10 DIAGNOSIS — G4709 Other insomnia: Secondary | ICD-10-CM | POA: Diagnosis not present

## 2023-06-10 DIAGNOSIS — Z13220 Encounter for screening for lipoid disorders: Secondary | ICD-10-CM

## 2023-06-10 DIAGNOSIS — G4733 Obstructive sleep apnea (adult) (pediatric): Secondary | ICD-10-CM

## 2023-06-10 DIAGNOSIS — E039 Hypothyroidism, unspecified: Secondary | ICD-10-CM | POA: Diagnosis not present

## 2023-06-10 NOTE — Progress Notes (Signed)
Established Patient Office Visit  Subjective   Patient ID: [name] [name], male    DOB: March 31, 1941  Age: 83 y.o. MRN: [id number]  Chief Complaint  Patient presents with   Medical Management of Chronic Issues    Patient is here for a 6 month follow up, he is here with his wife and she would like his feet looked at for fungus    HPI  6 month follow up  Hypothyroidism He is taking synthroid 200 mcg daily. He is taking it daily on an empty stomach.  Insomnia Pt is taking Rozerem 8mg  with Melatonin and Namenda 10 mg at night. Pt reports at times he still tosses and turns. He does have hx of OSA and was intolerant with the CPAP.   Pt's wife would like to get his lipid checked.  Review of Systems  Skin:         Dry skin on feet  All other systems reviewed and are negative.     Objective:     BP 123/77   Pulse 73   Temp 98 F (36.7 C) (Oral)   Resp 18   Ht 5\' 7"  (1.702 m)   Wt 230 lb 3.2 oz (104.4 kg)   SpO2 98%   BMI 36.05 kg/m    Physical Exam Vitals and nursing note reviewed.  Constitutional:      Appearance: Normal appearance. He is normal weight.  HENT:     Head: Normocephalic and atraumatic.     Right Ear: External ear normal.     Left Ear: External ear normal.     Nose: Nose normal.     Mouth/Throat:     Mouth: Mucous membranes are moist.     Pharynx: Oropharynx is clear.  Eyes:     Conjunctiva/sclera: Conjunctivae normal.     Pupils: Pupils are equal, round, and reactive to light.  Cardiovascular:     Rate and Rhythm: Normal rate.  Pulmonary:     Effort: Pulmonary effort is normal.  Skin:    General: Skin is dry.     Capillary Refill: Capillary refill takes less than 2 seconds.     Comments: Dry cracked feet  Neurological:     General: No focal deficit present.     Mental Status: He is alert and oriented to person, place, and time. Mental status is at baseline.  Psychiatric:        Mood and Affect: Mood normal.        Behavior: Behavior  normal.        Thought Content: Thought content normal.        Judgment: Judgment normal.      No results found for any visits on 06/10/23.    The ASCVD Risk score (Arnett DK, et al., 2019) failed to calculate for the following reasons:   The 2019 ASCVD risk score is only valid for ages 62 to 36   Risk score cannot be calculated because patient has a medical history suggesting prior/existing ASCVD    Assessment & Plan:   Problem List Items Addressed This Visit       Endocrine   Hypothyroidism - Primary   Relevant Orders   TSH   T4, free   Other Visit Diagnoses       Other insomnia         OSA (obstructive sleep apnea)         Encounter for lipid screening for cardiovascular disease  Relevant Orders   Lipid panel     Dry skin          Acquired hypothyroidism -     TSH -     T4, free  Other insomnia  OSA (obstructive sleep apnea)  Encounter for lipid screening for cardiovascular disease -     Lipid panel  Dry skin   To continue synthroid daily. To return for labs To check fasting lipid panel Discussed sleep disturbances likely consequence of not having OSA treated. Explained to pt and wife on home care instructions for dry cracked heels and feet.   Return in about 4 weeks (around 07/08/2023) for Subsequent Medicare Wellness.    Suzan Slick, MD

## 2023-06-10 NOTE — Patient Instructions (Signed)
Kerasol foot repair

## 2023-06-11 DIAGNOSIS — Z13220 Encounter for screening for lipoid disorders: Secondary | ICD-10-CM | POA: Diagnosis not present

## 2023-06-11 DIAGNOSIS — Z136 Encounter for screening for cardiovascular disorders: Secondary | ICD-10-CM | POA: Diagnosis not present

## 2023-06-11 DIAGNOSIS — E039 Hypothyroidism, unspecified: Secondary | ICD-10-CM | POA: Diagnosis not present

## 2023-06-12 LAB — LIPID PANEL
Chol/HDL Ratio: 6.1 {ratio} — ABNORMAL HIGH (ref 0.0–5.0)
Cholesterol, Total: 141 mg/dL (ref 100–199)
HDL: 23 mg/dL — ABNORMAL LOW (ref >39)
LDL Chol Calc (NIH): 90 mg/dL (ref 0–99)
Triglycerides: 157 mg/dL — ABNORMAL HIGH (ref 0–149)
VLDL Cholesterol Cal: 28 mg/dL (ref 5–40)

## 2023-06-12 LAB — TSH: TSH: 3.02 u[IU]/mL (ref 0.450–4.500)

## 2023-06-12 LAB — T4, FREE: Free T4: 1.49 ng/dL (ref 0.82–1.77)

## 2023-06-16 ENCOUNTER — Other Ambulatory Visit: Payer: Self-pay | Admitting: Family Medicine

## 2023-06-16 DIAGNOSIS — I480 Paroxysmal atrial fibrillation: Secondary | ICD-10-CM

## 2023-06-16 DIAGNOSIS — G4709 Other insomnia: Secondary | ICD-10-CM

## 2023-06-18 NOTE — Progress Notes (Signed)
HPI: FU congestive heart failure. Patient was previously followed by Chapman Fitch at [facility]. Patient has a history of coronary artery disease and had coronary artery bypass and graft in 2015. He also has a history of permanent atrial fibrillation, aortic stenosis, biventricular ICD and heart failure with improved ejection fraction. CT of the abdomen June 2023 showed no aneurysm. Echocardiogram May 2024 showed normal LV function, moderate left ventricular hypertrophy, grade 1 diastolic function, severe right ventricular enlargement, severe biatrial enlargement, moderate aortic stenosis with mean gradient 25 mmHg, mild aortic insufficiency.  Patient had CVA May 2024.  Seen by neurology and Coumadin changed to apixaban and aspirin 81 mg daily added.  Since last seen, he has dyspnea with vigorous activities but not routine activities.  No orthopnea, PND, pedal edema, chest pain or syncope.  Current Outpatient Medications  Medication Sig Dispense Refill   acetaminophen (TYLENOL) 500 MG tablet Take 500 mg by mouth as needed for mild pain.     allopurinol (ZYLOPRIM) 300 MG tablet TAKE 1 TABLET EVERY DAY TO PREVENT GOUT 90 tablet 3   ALLOPURINOL PO Take 300 mg by mouth daily.     aspirin EC 81 MG tablet Take 81 mg by mouth daily.     Choline Dihydrogen Citrate (CHOLINE CITRATE PO) Take by mouth.     Cyanocobalamin (B-12) 1000 MCG CAPS Take 1 capsule by mouth daily.     Docosahexaenoic Acid (DHA OMEGA 3 PO) Take 1 tablet by mouth daily.     docusate sodium (COLACE) 100 MG capsule Take 100 mg by mouth 2 (two) times daily. 1 tablet in the morning and 1 tablet at bedtime (stool softener)     donepezil (ARICEPT) 10 MG tablet Take 1 tablet (10 mg total) by mouth at bedtime. 90 tablet 4   ELIQUIS 5 MG TABS tablet TAKE 1 TABLET TWICE DAILY 180 tablet 3   ezetimibe (ZETIA) 10 MG tablet Take 1 tablet (10 mg total) by mouth daily. 30 tablet 1   finasteride (PROSCAR) 5 MG tablet TAKE 1 TABLET EVERY DAY 90  tablet 3   hydroxyurea (HYDREA) 500 MG capsule Take 1,000 mg by mouth daily.     isosorbide mononitrate (IMDUR) 30 MG 24 hr tablet TAKE 1 TABLET EVERY DAY 90 tablet 3   levothyroxine (SYNTHROID) 200 MCG tablet TAKE 1 TAB DAILY ON EMPTY STOMACH WITH ONLY WATER FOR 30 MINS. NO ANTACIDS, CALCIUM OR MAGNESIUM FOR 4 HOURS. AVOID BIOTIN. 90 tablet 1   magnesium oxide (MAG-OX) 400 MG tablet Take 400 mg by mouth daily.     MELATONIN PO Take 1 tablet by mouth daily as needed (For sleep).     memantine (NAMENDA) 10 MG tablet Start with one tab at bedtime. In 2 weeks if doing well can increase to 1 tab twice daily. 60 tablet 0   metoprolol (TOPROL-XL) 200 MG 24 hr tablet Take 200 mg by mouth daily.     Multiple Vitamin (MULTIVITAMIN) tablet Take 1 tablet by mouth daily.     omeprazole (PRILOSEC) 20 MG capsule TAKE 1 CAPSULE TWICE DAILY  ( EVERY 12 HOURS  ) TO PREVENT HEARTBURN AND ACID REFLUX 180 capsule 3   Probiotic Product (PROBIOTIC-10 PO) Take 1 tablet by mouth daily.     ramelteon (ROZEREM) 8 MG tablet TAKE 1 TABLET BY MOUTH AT BEDTIME 30 tablet 0   rosuvastatin (CRESTOR) 40 MG tablet TAKE 1 TABLET BY MOUTH DAILY. 90 tablet 3   tamsulosin (FLOMAX) 0.4 MG CAPS capsule  TAKE 1 CAPSULE AT BEDTIME FOR PROSTATE AND BLADDER 90 capsule 3   torsemide (DEMADEX) 20 MG tablet TAKE 1 TABLET EVERY MORNING FOR SWELLING IN LEGS 90 tablet 3   UNABLE TO FIND Med Name: Tumeric     cephALEXin (KEFLEX) 500 MG capsule Take 1 capsule (500 mg total) by mouth 2 (two) times daily. (Patient not taking: Reported on 07/01/2023) 14 capsule 0   No current facility-administered medications for this visit.     Past Medical History:  Diagnosis Date   A-fib (HCC) 01/2014   AICD (automatic cardioverter/defibrillator) present    [employer]   Anal fissure 11/10/2019   Arthritis    ASHD (arteriosclerotic heart disease) 2015   s/p CABG   Cataract    s/p left   CHF (congestive heart failure) (HCC)    COPD (chronic  obstructive pulmonary disease) (HCC)    by CXR, pt unsure of this   Diverticulosis    GERD (gastroesophageal reflux disease)    Gout 2014   Hyperlipidemia    Hypertension    Hypothyroidism    LBBB (left bundle branch block)    Polycythemia vera (HCC)    Prediabetes 01/2014   Presence of permanent cardiac pacemaker    Sleep apnea    no CPAP    Past Surgical History:  Procedure Laterality Date   CARDIAC DEFIBRILLATOR PLACEMENT  07/2014   CATARACT EXTRACTION W/ INTRAOCULAR LENS IMPLANT Left    COLONOSCOPY  08/26/2022   normal   CORONARY ARTERY BYPASS GRAFT  11/2013   ESOPHAGOGASTRODUODENOSCOPY  08/26/2022   esophageal dilation   lumb laminectomy  [phone]   x 3   LUMBAR FUSION  2013   NASAL SEPTOPLASTY W/ TURBINOPLASTY Bilateral 01/30/2016   Procedure: NASAL SEPTOPLASTY WITH BILATERAL TURBINATE REDUCTION;  Surgeon: Drema Halon, MD;  Location: [facility] OR;  Service: ENT;  Laterality: Bilateral;   TONSILLECTOMY     UVULECTOMY N/A 01/30/2016   Procedure: Alfredo Martinez;  Surgeon: Drema Halon, MD;  Location: [facility] OR;  Service: ENT;  Laterality: N/A;    Social History   Socioeconomic History   Marital status: Married    Spouse name: [name]    Number of children: 2   Years of education: 13   Highest education level: Not on file  Occupational History   Occupation: Retired  Tobacco Use   Smoking status: Former    Current packs/day: 0.00    Average packs/day: 2.0 packs/day for 25.0 years (50.0 ttl pk-yrs)    Types: Cigarettes    Start date: 05/19/1958    Quit date: 05/20/1983    Years since quitting: 40.1   Smokeless tobacco: Never  Vaping Use   Vaping status: Never Used  Substance and Sexual Activity   Alcohol use: Never   Drug use: Never   Sexual activity: Not Currently  Other Topics Concern   Not on file  Social History Narrative   Lives with wife, [name]   Caffeine use: daily (Coffee)   Social Drivers of Health   Financial Resource Strain: Unknown  (07/28/2019)   [facility] [employer] [facility]    5. Do you ever have problems being able to afford everything you need?: Not on file    4. Over the last 6 months, have you had trouble paying your heating or electricity bill?: Not on file  Food Insecurity: No Food Insecurity (06/05/2021)   Received from [facility]   Hunger Vital Sign  Worried About Programme researcher, broadcasting/film/video in the Last Year: Never true    Ran Out of Food in the Last Year: Never true  Transportation Needs: No Transportation Needs (03/29/2021)   PRAPARE - Administrator, Civil Service (Medical): No    Lack of Transportation (Non-Medical): No  Physical Activity: Not on file  Stress: Not on file  Social Connections: Unknown (09/18/2021)   Received from [facility], [facility]   Social Network    Social Network: Not on file  Intimate Partner Violence: Unknown (08/20/2021)   Received from [facility]   HITS    Physically Hurt: Not on file    Insult or Talk Down To: Not on file    Threaten Physical Harm: Not on file    Scream or Curse: Not on file    Family History  Problem Relation Age of Onset   Alzheimer's disease Mother    Mental illness Mother    Depression Mother    Heart disease Father 48       had 5 MI's & died 77 yo   Hypertension Father    Alcohol abuse Son    Heart attack Son    Stroke Maternal Grandmother    Dementia Maternal Grandmother    Cancer - Prostate Other    Colon cancer Neg Hx    Stomach cancer Neg Hx    Esophageal cancer Neg Hx     ROS: no fevers or chills, productive cough, hemoptysis, dysphasia, odynophagia, melena, hematochezia, dysuria, hematuria, rash, seizure activity, orthopnea, PND, pedal edema, claudication. Remaining systems are negative.  Physical Exam: Well-developed well-nourished in no acute distress.  Skin is warm and dry.  HEENT is normal.  Neck is supple.  Chest is clear to  auscultation with normal expansion.  Cardiovascular exam is irregular, 3/6 systolic murmur left sternal border. Abdominal exam nontender or distended. No masses palpated. Extremities show no edema. neuro grossly intact  EKG Interpretation Date/Time:  Wednesday July 01 2023 16:41:34 EST Ventricular Rate:  70 PR Interval:    QRS Duration:  196 QT Interval:  482 QTC Calculation: 520 R Axis:   -81  Text Interpretation: Ventricular-paced rhythm When compared with ECG of 16-Sep-2022 15:05, PREVIOUS ECG IS PRESENT Confirmed by Olga Millers ([id number]) on 07/01/2023 4:43:04 PM    A/P  1 permanent atrial fibrillation-continue apixaban.  Continue Toprol for rate control.  2 coronary artery disease status post coronary bypass and graft-patient is not having chest pain.  Continue statin.  3 biventricular ICD-followed by Dr. Elberta Fortis.  4 heart failure improved ejection fraction-most recent echocardiogram showed normalization of LV function.  Plan to continue Demadex at present dose.  Add Jardiance 10 mg daily.  Check potassium and renal function in 1 week.  5 aortic stenosis-plan follow-up echocardiogram May 2025.  6 hypertension-blood pressure is controlled.  Continue present medical regimen.  7 hyperlipidemia-continue Crestor and Zetia.  Olga Millers, MD

## 2023-06-22 ENCOUNTER — Ambulatory Visit: Payer: Medicare Other | Admitting: Adult Health

## 2023-06-29 ENCOUNTER — Ambulatory Visit: Payer: Medicare Other | Admitting: Hematology

## 2023-06-29 ENCOUNTER — Other Ambulatory Visit: Payer: Medicare Other

## 2023-07-01 ENCOUNTER — Ambulatory Visit (INDEPENDENT_AMBULATORY_CARE_PROVIDER_SITE_OTHER): Payer: Medicare Other | Admitting: Cardiology

## 2023-07-01 ENCOUNTER — Encounter: Payer: Self-pay | Admitting: Cardiology

## 2023-07-01 VITALS — BP 125/70 | HR 70 | Ht 67.0 in | Wt 232.0 lb

## 2023-07-01 DIAGNOSIS — Z950 Presence of cardiac pacemaker: Secondary | ICD-10-CM

## 2023-07-01 DIAGNOSIS — I350 Nonrheumatic aortic (valve) stenosis: Secondary | ICD-10-CM | POA: Diagnosis not present

## 2023-07-01 DIAGNOSIS — I2510 Atherosclerotic heart disease of native coronary artery without angina pectoris: Secondary | ICD-10-CM

## 2023-07-01 DIAGNOSIS — I10 Essential (primary) hypertension: Secondary | ICD-10-CM | POA: Diagnosis not present

## 2023-07-01 DIAGNOSIS — I4821 Permanent atrial fibrillation: Secondary | ICD-10-CM | POA: Diagnosis not present

## 2023-07-01 DIAGNOSIS — E782 Mixed hyperlipidemia: Secondary | ICD-10-CM

## 2023-07-01 MED ORDER — EMPAGLIFLOZIN 10 MG PO TABS: 10.0000 mg | ORAL_TABLET | Freq: Every day | ORAL | 11 refills | Status: DC

## 2023-07-01 NOTE — Patient Instructions (Signed)
Medication Instructions:   START JARDIANCE 10 MG ONCE DAILY  *If you need a refill on your cardiac medications before your next appointment, please call your pharmacy*   Lab Work:  Your physician recommends that you return for lab work in: ONE WEEK-DO NOT NEED TO FAST  If you have labs (blood work) drawn today and your tests are completely normal, you will receive your results only by: MyChart Message (if you have MyChart) OR A paper copy in the mail If you have any lab test that is abnormal or we need to change your treatment, we will call you to review the results.   Follow-Up: At [facility], you and your health needs are our priority.  As part of our continuing mission to provide you with exceptional heart care, we have created designated Provider Care Teams.  These Care Teams include your primary Cardiologist (physician) and Advanced Practice Providers (APPs -  Physician Assistants and Nurse Practitioners) who all work together to provide you with the care you need, when you need it.  We recommend signing up for the patient portal called "MyChart".  Sign up information is provided on this After Visit Summary.  MyChart is used to connect with patients for Virtual Visits (Telemedicine).  Patients are able to view lab/test results, encounter notes, upcoming appointments, etc.  Non-urgent messages can be sent to your provider as well.   To learn more about what you can do with MyChart, go to [email or web address].    Your next appointment:   6 month(s)  Provider:   Olga Millers, MD

## 2023-07-06 ENCOUNTER — Other Ambulatory Visit: Payer: Self-pay | Admitting: Family Medicine

## 2023-07-06 DIAGNOSIS — E039 Hypothyroidism, unspecified: Secondary | ICD-10-CM

## 2023-07-08 ENCOUNTER — Encounter: Payer: Medicare Other | Admitting: Family Medicine

## 2023-07-11 ENCOUNTER — Other Ambulatory Visit: Payer: Self-pay | Admitting: Family Medicine

## 2023-07-11 DIAGNOSIS — I480 Paroxysmal atrial fibrillation: Secondary | ICD-10-CM

## 2023-07-11 DIAGNOSIS — G4709 Other insomnia: Secondary | ICD-10-CM

## 2023-07-14 ENCOUNTER — Other Ambulatory Visit: Payer: Self-pay

## 2023-07-14 DIAGNOSIS — D45 Polycythemia vera: Secondary | ICD-10-CM

## 2023-07-14 DIAGNOSIS — C9110 Chronic lymphocytic leukemia of B-cell type not having achieved remission: Secondary | ICD-10-CM

## 2023-07-15 ENCOUNTER — Inpatient Hospital Stay (HOSPITAL_BASED_OUTPATIENT_CLINIC_OR_DEPARTMENT_OTHER): Payer: Medicare Other | Admitting: Hematology

## 2023-07-15 ENCOUNTER — Inpatient Hospital Stay: Payer: Medicare Other | Attending: Hematology

## 2023-07-15 VITALS — BP 107/67 | HR 70 | Temp 97.7°F | Resp 17 | Wt 231.3 lb

## 2023-07-15 DIAGNOSIS — Z7982 Long term (current) use of aspirin: Secondary | ICD-10-CM | POA: Insufficient documentation

## 2023-07-15 DIAGNOSIS — Z79899 Other long term (current) drug therapy: Secondary | ICD-10-CM | POA: Diagnosis not present

## 2023-07-15 DIAGNOSIS — C9110 Chronic lymphocytic leukemia of B-cell type not having achieved remission: Secondary | ICD-10-CM

## 2023-07-15 DIAGNOSIS — D45 Polycythemia vera: Secondary | ICD-10-CM

## 2023-07-15 LAB — CBC WITH DIFFERENTIAL (CANCER CENTER ONLY)
Abs Immature Granulocytes: 0.12 10*3/uL — ABNORMAL HIGH (ref 0.00–0.07)
Basophils Absolute: 0.1 10*3/uL (ref 0.0–0.1)
Basophils Relative: 1 %
Eosinophils Absolute: 0.1 10*3/uL (ref 0.0–0.5)
Eosinophils Relative: 1 %
HCT: 46.6 % (ref 39.0–52.0)
Hemoglobin: 15.5 g/dL (ref 13.0–17.0)
Immature Granulocytes: 1 %
Lymphocytes Relative: 10 %
Lymphs Abs: 1.3 10*3/uL (ref 0.7–4.0)
MCH: 36 pg — ABNORMAL HIGH (ref 26.0–34.0)
MCHC: 33.3 g/dL (ref 30.0–36.0)
MCV: 108.1 fL — ABNORMAL HIGH (ref 80.0–100.0)
Monocytes Absolute: 0.3 10*3/uL (ref 0.1–1.0)
Monocytes Relative: 2 %
Neutro Abs: 11.9 10*3/uL — ABNORMAL HIGH (ref 1.7–7.7)
Neutrophils Relative %: 85 %
Platelet Count: 353 10*3/uL (ref 150–400)
RBC: 4.31 MIL/uL (ref 4.22–5.81)
RDW: 15.4 % (ref 11.5–15.5)
WBC Count: 13.9 10*3/uL — ABNORMAL HIGH (ref 4.0–10.5)
nRBC: 0 % (ref 0.0–0.2)

## 2023-07-15 LAB — CMP (CANCER CENTER ONLY)
ALT: 14 U/L (ref 0–44)
AST: 22 U/L (ref 15–41)
Albumin: 4.2 g/dL (ref 3.5–5.0)
Alkaline Phosphatase: 59 U/L (ref 38–126)
Anion gap: 6 (ref 5–15)
BUN: 21 mg/dL (ref 8–23)
CO2: 32 mmol/L (ref 22–32)
Calcium: 9.4 mg/dL (ref 8.9–10.3)
Chloride: 104 mmol/L (ref 98–111)
Creatinine: 1.08 mg/dL (ref 0.61–1.24)
GFR, Estimated: >60 mL/min (ref >60)
Glucose, Bld: 157 mg/dL — ABNORMAL HIGH (ref 70–99)
Potassium: 3.5 mmol/L (ref 3.5–5.1)
Sodium: 142 mmol/L (ref 135–145)
Total Bilirubin: 1.4 mg/dL — ABNORMAL HIGH (ref 0.0–1.2)
Total Protein: 6.7 g/dL (ref 6.5–8.1)

## 2023-07-15 NOTE — Progress Notes (Signed)
 HEMATOLOGY/ONCOLOGY CLINIC VISIT NOTE  Date of Service: 07/15/23   Patient Care Team: Suzan Slick, MD as PCP - General (Family Medicine) Regan Lemming, MD as PCP - Electrophysiology (Cardiology) Mayme Genta, Theotis Barrio, MD as Referring Physician (Cardiology) Boneta Lucks Olga Coaster, MD as Referring Physician (Cardiology) Johney Maine, MD as Consulting Physician (Hematology) Lajoyce Corners, [facility] (Inactive) as Pharmacist (Pharmacist) Dohmeier, Porfirio Mylar, MD as Consulting Physician (Neurology)  CHIEF COMPLAINT F/u for continued evaluation and mx of polycythemia vera  HISTORY OF PRESENTING ILLNESS:  Please see previous note for details on initial presentation  INTERVAL HISTORY:   [name] [name] is here for continued evaluation and management of [name]. Patient was last seen by me on 02/20/2023 and reported mild memory retention issues. He was noted to have gained 9 pounds in two months.   Today, he is accompanied by his wife. Patient reports that he is feeling well since his last clinical visit four months ago.   Patient continues to take Hydroxyurea regularly and denies any new or major toxicities.   His wife reports that patient's memory issues continue to progressively improve. His wife reports that he continues to have memory retention issues when watching a series and notes that he has with recognizing 7-8 letters when reading show summaries.   He continues to engage in yard work. He sleeps well at night and eats well with adequate water intake.   Patient reports that his neurologist has stopped Aspirin. He continues to take Eliquis. Patient denies any new symptoms such as chest pain.   His wife reports that when he drifts into a nap during the day, she notices that patient has slower breathing. Patient does have sleep apnea and does not use CPAP during the day.   His wife reports that he may have had a sinus infection two months ago and  was taking Keflex. Patient denies starting any other recent medications.   He reports that his cardiologist wanted him to be on Jardiance for heart management, which patient does not want to take due to side effects of fluid retention.   He reports mild rhinorrhea. Patient denies any sore throat, new cough, diarrhea, recent infection, issues passing urine, or abdominal pain.   He notes that he has an alarm set on his phone to help take medications on time.   Plans to travel to [place] soon.   MEDICAL HISTORY:  Past Medical History:  Diagnosis Date   A-fib (HCC) 01/2014   AICD (automatic cardioverter/defibrillator) present    [employer]   Anal fissure 11/10/2019   Arthritis    ASHD (arteriosclerotic heart disease) 2015   s/p CABG   Cataract    s/p left   CHF (congestive heart failure) (HCC)    COPD (chronic obstructive pulmonary disease) (HCC)    by CXR, pt unsure of this   Diverticulosis    GERD (gastroesophageal reflux disease)    Gout 2014   Hyperlipidemia    Hypertension    Hypothyroidism    LBBB (left bundle branch block)    Polycythemia vera (HCC)    Prediabetes 01/2014   Presence of permanent cardiac pacemaker    Sleep apnea    no CPAP    SURGICAL HISTORY: Past Surgical History:  Procedure Laterality Date   CARDIAC DEFIBRILLATOR PLACEMENT  07/2014   CATARACT EXTRACTION W/ INTRAOCULAR LENS IMPLANT Left    COLONOSCOPY  08/26/2022   normal   CORONARY ARTERY BYPASS GRAFT  11/2013  ESOPHAGOGASTRODUODENOSCOPY  08/26/2022   esophageal dilation   lumb laminectomy  [phone]   x 3   LUMBAR FUSION  2013   NASAL SEPTOPLASTY W/ TURBINOPLASTY Bilateral 01/30/2016   Procedure: NASAL SEPTOPLASTY WITH BILATERAL TURBINATE REDUCTION;  Surgeon: Drema Halon, MD;  Location: [facility] OR;  Service: ENT;  Laterality: Bilateral;   TONSILLECTOMY     UVULECTOMY N/A 01/30/2016   Procedure: Alfredo Martinez;  Surgeon: Drema Halon, MD;  Location: [facility] OR;  Service:  ENT;  Laterality: N/A;    SOCIAL HISTORY: Social History   Socioeconomic History   Marital status: Married    Spouse name: [name]    Number of children: 2   Years of education: 13   Highest education level: Not on file  Occupational History   Occupation: Retired  Tobacco Use   Smoking status: Former    Current packs/day: 0.00    Average packs/day: 2.0 packs/day for 25.0 years (50.0 ttl pk-yrs)    Types: Cigarettes    Start date: 05/19/1958    Quit date: 05/20/1983    Years since quitting: 40.1   Smokeless tobacco: Never  Vaping Use   Vaping status: Never Used  Substance and Sexual Activity   Alcohol use: Never   Drug use: Never   Sexual activity: Not Currently  Other Topics Concern   Not on file  Social History Narrative   Lives with wife, [name]   Caffeine use: daily (Coffee)   Social Drivers of Health   Financial Resource Strain: Unknown (07/28/2019)   [facility] [employer] [facility]    5. Do you ever have problems being able to afford everything you need?: Not on file    4. Over the last 6 months, have you had trouble paying your heating or electricity bill?: Not on file  Food Insecurity: No Food Insecurity (06/05/2021)   Received from [facility], [facility]   Hunger Vital Sign    Worried About Running Out of Food in the Last Year: Never true    Ran Out of Food in the Last Year: Never true  Transportation Needs: No Transportation Needs (03/29/2021)   PRAPARE - Administrator, Civil Service (Medical): No    Lack of Transportation (Non-Medical): No  Physical Activity: Not on file  Stress: Not on file  Social Connections: Unknown (09/18/2021)   Received from [facility], [facility]   Social Network    Social Network: Not on file  Intimate Partner Violence: Unknown (08/20/2021)   Received from [facility], [facility]   HITS    Physically Hurt: Not on file    Insult or Talk Down To: Not on file     Threaten Physical Harm: Not on file    Scream or Curse: Not on file    FAMILY HISTORY: Family History  Problem Relation Age of Onset   Alzheimer's disease Mother    Mental illness Mother    Depression Mother    Heart disease Father 34       had 5 MI's & died 25 yo   Hypertension Father    Alcohol abuse Son    Heart attack Son    Stroke Maternal Grandmother    Dementia Maternal Grandmother    Cancer - Prostate Other    Colon cancer Neg Hx    Stomach cancer Neg Hx    Esophageal cancer Neg Hx     ALLERGIES:  is allergic to other.  MEDICATIONS:  Current Outpatient Medications  Medication Sig Dispense Refill   acetaminophen (TYLENOL) 500 MG tablet Take 500 mg by mouth as needed for mild pain.     allopurinol (ZYLOPRIM) 300 MG tablet TAKE 1 TABLET EVERY DAY TO PREVENT GOUT 90 tablet 3   ALLOPURINOL PO Take 300 mg by mouth daily.     aspirin EC 81 MG tablet Take 81 mg by mouth daily.     cephALEXin (KEFLEX) 500 MG capsule Take 1 capsule (500 mg total) by mouth 2 (two) times daily. (Patient not taking: Reported on 07/01/2023) 14 capsule 0   Choline Dihydrogen Citrate (CHOLINE CITRATE PO) Take by mouth.     Cyanocobalamin (B-12) 1000 MCG CAPS Take 1 capsule by mouth daily.     Docosahexaenoic Acid (DHA OMEGA 3 PO) Take 1 tablet by mouth daily.     docusate sodium (COLACE) 100 MG capsule Take 100 mg by mouth 2 (two) times daily. 1 tablet in the morning and 1 tablet at bedtime (stool softener)     donepezil (ARICEPT) 10 MG tablet Take 1 tablet (10 mg total) by mouth at bedtime. 90 tablet 4   ELIQUIS 5 MG TABS tablet TAKE 1 TABLET TWICE DAILY 180 tablet 3   empagliflozin (JARDIANCE) 10 MG TABS tablet Take 1 tablet (10 mg total) by mouth daily before breakfast. 30 tablet 11   ezetimibe (ZETIA) 10 MG tablet Take 1 tablet (10 mg total) by mouth daily. 30 tablet 1   finasteride (PROSCAR) 5 MG tablet TAKE 1 TABLET EVERY DAY 90 tablet 3   hydroxyurea (HYDREA) 500 MG capsule Take 1,000 mg by  mouth daily.     isosorbide mononitrate (IMDUR) 30 MG 24 hr tablet TAKE 1 TABLET EVERY DAY 90 tablet 3   levothyroxine (SYNTHROID) 200 MCG tablet TAKE 1 TAB DAILY ON EMPTY STOMACH WITH ONLY WATER FOR 30 MINS. NO ANTACIDS, CALCIUM OR MAGNESIUM FOR 4 HRS. AVOID BIOTIN. 90 tablet 0   magnesium oxide (MAG-OX) 400 MG tablet Take 400 mg by mouth daily.     MELATONIN PO Take 1 tablet by mouth daily as needed (For sleep).     memantine (NAMENDA) 10 MG tablet Start with one tab at bedtime. In 2 weeks if doing well can increase to 1 tab twice daily. 60 tablet 0   metoprolol (TOPROL-XL) 200 MG 24 hr tablet Take 200 mg by mouth daily.     Multiple Vitamin (MULTIVITAMIN) tablet Take 1 tablet by mouth daily.     omeprazole (PRILOSEC) 20 MG capsule TAKE 1 CAPSULE TWICE DAILY  ( EVERY 12 HOURS  ) TO PREVENT HEARTBURN AND ACID REFLUX 180 capsule 3   Probiotic Product (PROBIOTIC-10 PO) Take 1 tablet by mouth daily.     ramelteon (ROZEREM) 8 MG tablet TAKE 1 TABLET BY MOUTH AT BEDTIME 30 tablet 0   rosuvastatin (CRESTOR) 40 MG tablet TAKE 1 TABLET BY MOUTH DAILY. 90 tablet 3   tamsulosin (FLOMAX) 0.4 MG CAPS capsule TAKE 1 CAPSULE AT BEDTIME FOR PROSTATE AND BLADDER 90 capsule 3   torsemide (DEMADEX) 20 MG tablet TAKE 1 TABLET EVERY MORNING FOR SWELLING IN LEGS 90 tablet 3   UNABLE TO FIND Med Name: Tumeric     No current facility-administered medications for this visit.    REVIEW OF SYSTEMS:    10 Point review of Systems was done is negative except as noted above.   PHYSICAL EXAMINATION:  .There were no vitals taken for this visit.   GENERAL:alert, in no  acute distress and comfortable SKIN: no acute rashes, no significant lesions EYES: conjunctiva are pink and non-injected, sclera anicteric OROPHARYNX: MMM, no exudates, no oropharyngeal erythema or ulceration NECK: supple, no JVD LYMPH:  no palpable lymphadenopathy in the cervical, axillary or inguinal regions LUNGS: clear to auscultation b/l with  normal respiratory effort HEART: regular rate & rhythm ABDOMEN:  normoactive bowel sounds , non tender, not distended. Extremity: no pedal edema PSYCH: alert & oriented x 3 with fluent speech NEURO: no focal motor/sensory deficits   LABORATORY DATA:  I have reviewed the data as listed  .    Latest Ref Rng & Units 02/20/2023   12:56 PM 09/23/2022    3:16 PM 09/18/2022    6:56 AM  CBC  WBC 4.0 - 10.5 K/uL 11.5  10.4  10.8   Hemoglobin 13.0 - 17.0 g/dL 09.8  11.9  14.7   Hematocrit 39.0 - 52.0 % 48.5  43.0  45.2   Platelets 150 - 400 K/uL 324  486  363     .    Latest Ref Rng & Units 02/20/2023   12:56 PM 09/23/2022    3:16 PM 09/18/2022    6:56 AM  CMP  Glucose 70 - 99 mg/dL 829  562  130   BUN 8 - 23 mg/dL 17  14  14    Creatinine 0.61 - 1.24 mg/dL 8.65  7.84  6.96   Sodium 135 - 145 mmol/L 139  136  134   Potassium 3.5 - 5.1 mmol/L 3.6  5.3  3.9   Chloride 98 - 111 mmol/L 100  95  99   CO2 22 - 32 mmol/L 33  27  26   Calcium 8.9 - 10.3 mg/dL 9.4  9.5  9.1   Total Protein 6.5 - 8.1 g/dL 6.6  6.7    Total Bilirubin 0.3 - 1.2 mg/dL 1.4  1.1    Alkaline Phos 38 - 126 U/L 57  77    AST 15 - 41 U/L 22  33    ALT 0 - 44 U/L 18  44      02/09/18 JAK2 Mutation Study:    02/09/18 BCR ABL FISH:    RADIOGRAPHIC STUDIES: I have personally reviewed the radiological images as listed and agreed with the findings in the report. No results found.  ASSESSMENT & PLAN:   83 y.o. male with  1. 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 Some associated leukocytosis. No history of splenectomy. No previous h/o VTE, but significant cardiac risk factors. 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   02/09/18 BCR ABL FISH  revealed no evidence of BCR/ABL rearrangement   2. . 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) 12/15/2022   Acute CVA (cerebrovascular accident) (HCC) 09/16/2022   Chronic diastolic CHF (congestive heart failure) (HCC) 09/16/2022   Encounter for screening colonoscopy 08/26/2022   Chronic ulcer of left foot with fat layer exposed (HCC) 07/10/2022   Zenker's diverticulum 10/16/2021   Loss of appetite 10/10/2021   Loss of weight 10/10/2021   Generalized abdominal pain  10/10/2021   Thrombosed external hemorrhoid 10/10/2021   Dysphagia 08/01/2021   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   Intolerance of continuous positive airway pressure (CPAP) ventilation 05/02/2021   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   JAK2 V617F mutation 08/27/2020   Rectal pain 11/10/2019   Constipation 11/10/2019   Abnormal glucose 10/12/2019   Memory changes 01/10/2019   Polycythemia vera (HCC) 10/21/2018   Thrombocytosis 12/29/2017   Senile purpura (HCC) 09/01/2017   Emphysema of lung (HCC) 06/01/2017   Tortuous aorta (HCC)- CXR 2018 06/01/2017   Regular astigmatism, right eye 10/16/2016   Combined forms of age-related cataract of right eye 10/16/2016   Trigger ring finger of left hand 08/06/2016   Bilateral carpal tunnel syndrome 08/06/2016   Ischemic cardiomyopathy 06/22/2015   ASHD/CABG x 3V (11/2013) 02/06/2015   Acquired thrombophilia (HCC) 02/06/2015   Cardiac pacemaker/AICD (01/2014) 02/06/2015   Morbid obesity (HCC) 01/03/2015   HTN (hypertension) 01/02/2015   Hyperlipidemia 01/02/2015   Vitamin D deficiency 01/02/2015   GERD  01/02/2015   BPH (benign prostatic hyperplasia) 01/02/2015   Medication management 01/02/2015   Atrial fibrillation (HCC) 01/02/2015    Hypothyroidism 01/02/2015   OSA and COPD overlap syndrome (HCC) 01/02/2015   Chronic systolic (congestive) heart failure (HCC) 02/17/2014   S/P CABG x 3 02/13/2014   Atherosclerotic heart disease of native coronary artery without angina pectoris 02/08/2014   Gout 02/08/2014   Cardiomyopathy (HCC) 02/08/2014   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) 02/06/2014  -continue f/u with PCP to optimize atherosclerotic risk factors.  PLAN:  -Discussed lab results on 07/15/23 in detail with patient. CBC normal, showed WBC of 13.9K, hemoglobin of 15.5, hematocrit 46.6, and platelets of 353K. -CMP stable; no sign of dehydration -platelets normal -WBCs are mildly elevated, which is not concerning -did not feel any enlarged spleen during physical examination -JAK2 positive polycythemia vera stable -Patient has no current indication for therapeutic phlebotomy at this time -No notable toxicity from his current dose of hydroxyurea -He will continue his hydroxyurea at 1000 mg p.o. daily Monday to through Friday and 500mg  po daily on Saturday and sunday -continue eliquis at current dose -educated patient that jardiance pushes more fluid with sugar into the urine and can cause some concentration in the blood. Discussed that there may be a role for his cardiologist to adjust his heart medication if there is concern for fluid retention -recommend scheduled naps 40 minutes or less so that he can plan to use his CPAP machine during the day which would also help to improve cognition.  -recommend exposure to the sun and nature to improve memory -patient shall return to clinic in 4 months  FOLLOW-UP: RTC with Dr Candise Che with labs in 4 months  The total time spent in the appointment was *** minutes* .  All of the patient's questions were answered with apparent satisfaction. The patient knows to call the clinic with any problems, questions or concerns.   Wyvonnia Lora MD MS AAHIVMS [facility]  [facility] Hematology/Oncology Physician [facility]  .*Total Encounter Time as defined by the Centers for Medicare and Medicaid Services includes, in addition to the face-to-face time of a patient visit (documented in the note above) non-face-to-face time: obtaining and reviewing outside history, ordering and reviewing medications, tests or procedures, care coordination (communications with other health care professionals or caregivers) and documentation in the medical record.    I,Mitra Faeizi,acting as a Neurosurgeon for Wyvonnia Lora, MD.,have documented all relevant documentation on the  behalf of Wyvonnia Lora, MD,as directed by  Wyvonnia Lora, MD while in the presence of Wyvonnia Lora, MD.  ***

## 2023-07-16 ENCOUNTER — Encounter: Payer: Self-pay | Admitting: Family Medicine

## 2023-07-16 ENCOUNTER — Ambulatory Visit: Payer: Medicare Other | Admitting: Family Medicine

## 2023-07-16 VITALS — BP 114/75 | HR 69 | Temp 97.5°F | Resp 18 | Ht 67.0 in | Wt 231.7 lb

## 2023-07-16 DIAGNOSIS — Z0000 Encounter for general adult medical examination without abnormal findings: Secondary | ICD-10-CM | POA: Diagnosis not present

## 2023-07-16 NOTE — Patient Instructions (Signed)
 [name] [name] , Thank you for taking time to come for your Medicare Wellness Visit. I appreciate your ongoing commitment to your health goals. Please review the following plan we discussed and let me know if I can assist you in the future.   These are the goals we discussed:  Goals      Activity and Exercise Increased     Evidence-based guidance:  Review current exercise levels.  Assess patient perspective on exercise or activity level, barriers to increasing activity, motivation and readiness for change.  Recommend or set healthy exercise goal based on individual tolerance.  Encourage small steps toward making change in amount of exercise or activity.  Urge reduction of sedentary activities or screen time.  Promote group activities within the community or with family or support person.  Consider referral to rehabiliation therapist for assessment and exercise/activity plan.   Notes:      Exercise 150 min/wk Moderate Activity     LDL CALC < 70     Track and Manage Activity and Exertion-Heart Failure     Timeframe:  Long-Range Goal Priority:  High Start Date:                             Expected End Date:                       Follow Up Date 03/26/2021    - avoid heavy exercise on very hot days - drink water to stay hydrated during exercise - follow activity or exercise plan - make an activity or exercise plan - pace activity allowing for rest - track exercise in diary for two weeks - track symptoms during activity in diary - warm up and cool down for 10 minutes before and after exercise    Why is this important?   Exercising is very important when managing your heart failure.  It will help your heart get stronger.    Notes:      Track and Manage My Blood Pressure-Hypertension     Timeframe:  Long-Range Goal Priority:  High Start Date:                             Expected End Date:                       Follow Up Date 03/26/2021    - check blood pressure weekly - write  blood pressure results in a log or diary    Why is this important?   You won't feel high blood pressure, but it can still hurt your blood vessels.  High blood pressure can cause heart or kidney problems. It can also cause a stroke.  Making lifestyle changes like losing a little weight or eating less salt will help.  Checking your blood pressure at home and at different times of the day can help to control blood pressure.  If the doctor prescribes medicine remember to take it the way the doctor ordered.  Call the office if you cannot afford the medicine or if there are questions about it.     Notes:      Weight (lb) < 250 lb (113.4 kg)        This is a list of the screening recommended for you and due dates:  Health Maintenance  Topic Date Due   Zoster (Shingles)  Vaccine (2 of 2) 07/02/2022   COVID-19 Vaccine (5 - 2024-25 season) 01/18/2023   Medicare Annual Wellness Visit  07/15/2024   Pneumonia Vaccine  Completed   Flu Shot  Completed   HPV Vaccine  Aged Out   DTaP/Tdap/Td vaccine  Discontinued

## 2023-07-16 NOTE — Progress Notes (Signed)
 Subjective:   [name] [name] is a 83 y.o. male who presents for Medicare Annual/Subsequent preventive examination.  Visit Complete: In person  Patient Medicare AWV questionnaire was completed by the patient on 07/16/23; I have confirmed that all information answered by patient is correct and no changes since this date.  Cardiac Risk Factors include: dyslipidemia;advanced age (>40men, >76 women);family history of premature cardiovascular disease;hypertension;male gender;obesity (BMI >30kg/m2)     Objective:    Today's Vitals   07/16/23 1435  BP: 114/75  Pulse: 69  Resp: 18  Temp: (!) 97.5 F (36.4 C)  TempSrc: Oral  SpO2: 97%  Weight: 231 lb 11.2 oz (105.1 kg)  Height: 5\' 7"  (1.702 m)   Body mass index is 36.29 kg/m.     07/16/2023    2:40 PM 10/02/2022    2:12 PM 09/25/2022    2:03 PM 09/17/2022    1:00 AM 03/08/2021   11:13 AM 10/26/2019   10:56 AM 10/18/2019    3:53 PM  Advanced Directives  Does Patient Have a Medical Advance Directive? No Yes Yes No No Yes Yes  Type of Advance Directive  Living will Living will   Healthcare Power of [place];Living will Healthcare Power of [place];Living will  Does patient want to make changes to medical advance directive?   No - Patient declined   No - Patient declined   Copy of Healthcare Power of Attorney in Chart?      No - copy requested No - copy requested  Would patient like information on creating a medical advance directive? Yes (Inpatient - patient defers creating a medical advance directive and declines information at this time)    Yes (MAU/Ambulatory/Procedural Areas - Information given)      Current Medications (verified) Outpatient Encounter Medications as of 07/16/2023  Medication Sig   acetaminophen (TYLENOL) 500 MG tablet Take 500 mg by mouth as needed for mild pain.   allopurinol (ZYLOPRIM) 300 MG tablet TAKE 1 TABLET EVERY DAY TO PREVENT GOUT   ALLOPURINOL PO Take 300 mg by mouth daily. (Patient not taking: Reported  on 07/15/2023)   aspirin EC 81 MG tablet Take 81 mg by mouth daily.   cephALEXin (KEFLEX) 500 MG capsule Take 1 capsule (500 mg total) by mouth 2 (two) times daily. (Patient not taking: Reported on 07/15/2023)   Choline Dihydrogen Citrate (CHOLINE CITRATE PO) Take by mouth.   Cyanocobalamin (B-12) 1000 MCG CAPS Take 1 capsule by mouth daily.   Docosahexaenoic Acid (DHA OMEGA 3 PO) Take 1 tablet by mouth daily.   docusate sodium (COLACE) 100 MG capsule Take 100 mg by mouth 2 (two) times daily. 1 tablet in the morning and 1 tablet at bedtime (stool softener)   donepezil (ARICEPT) 10 MG tablet Take 1 tablet (10 mg total) by mouth at bedtime.   ELIQUIS 5 MG TABS tablet TAKE 1 TABLET TWICE DAILY   empagliflozin (JARDIANCE) 10 MG TABS tablet Take 1 tablet (10 mg total) by mouth daily before breakfast. (Patient not taking: Reported on 07/15/2023)   ezetimibe (ZETIA) 10 MG tablet Take 1 tablet (10 mg total) by mouth daily.   finasteride (PROSCAR) 5 MG tablet TAKE 1 TABLET EVERY DAY   hydroxyurea (HYDREA) 500 MG capsule Take 1,000 mg by mouth daily.   isosorbide mononitrate (IMDUR) 30 MG 24 hr tablet TAKE 1 TABLET EVERY DAY   levothyroxine (SYNTHROID) 200 MCG tablet TAKE 1 TAB DAILY ON EMPTY STOMACH WITH ONLY WATER FOR 30 MINS. NO ANTACIDS, CALCIUM OR  MAGNESIUM FOR 4 HRS. AVOID BIOTIN.   magnesium oxide (MAG-OX) 400 MG tablet Take 400 mg by mouth daily.   MELATONIN PO Take 1 tablet by mouth daily as needed (For sleep).   memantine (NAMENDA) 10 MG tablet Start with one tab at bedtime. In 2 weeks if doing well can increase to 1 tab twice daily.   metoprolol (TOPROL-XL) 200 MG 24 hr tablet Take 200 mg by mouth daily.   Multiple Vitamin (MULTIVITAMIN) tablet Take 1 tablet by mouth daily.   omeprazole (PRILOSEC) 20 MG capsule TAKE 1 CAPSULE TWICE DAILY  ( EVERY 12 HOURS  ) TO PREVENT HEARTBURN AND ACID REFLUX   Probiotic Product (PROBIOTIC-10 PO) Take 1 tablet by mouth daily.   ramelteon (ROZEREM) 8 MG tablet  TAKE 1 TABLET BY MOUTH AT BEDTIME   rosuvastatin (CRESTOR) 40 MG tablet TAKE 1 TABLET BY MOUTH DAILY.   tamsulosin (FLOMAX) 0.4 MG CAPS capsule TAKE 1 CAPSULE AT BEDTIME FOR PROSTATE AND BLADDER   torsemide (DEMADEX) 20 MG tablet TAKE 1 TABLET EVERY MORNING FOR SWELLING IN LEGS   UNABLE TO FIND Med Name: Tumeric   No facility-administered encounter medications on file as of 07/16/2023.    Allergies (verified) Other   History: Past Medical History:  Diagnosis Date   A-fib (HCC) 01/2014   AICD (automatic cardioverter/defibrillator) present    [employer]   Anal fissure 11/10/2019   Arthritis    ASHD (arteriosclerotic heart disease) 2015   s/p CABG   Cataract    s/p left   CHF (congestive heart failure) (HCC)    COPD (chronic obstructive pulmonary disease) (HCC)    by CXR, pt unsure of this   Diverticulosis    GERD (gastroesophageal reflux disease)    Gout 2014   Hyperlipidemia    Hypertension    Hypothyroidism    LBBB (left bundle branch block)    Polycythemia vera (HCC)    Prediabetes 01/2014   Presence of permanent cardiac pacemaker    Sleep apnea    no CPAP   Past Surgical History:  Procedure Laterality Date   CARDIAC DEFIBRILLATOR PLACEMENT  07/2014   CATARACT EXTRACTION W/ INTRAOCULAR LENS IMPLANT Left    COLONOSCOPY  08/26/2022   normal   CORONARY ARTERY BYPASS GRAFT  11/2013   ESOPHAGOGASTRODUODENOSCOPY  08/26/2022   esophageal dilation   lumb laminectomy  [phone]   x 3   LUMBAR FUSION  2013   NASAL SEPTOPLASTY W/ TURBINOPLASTY Bilateral 01/30/2016   Procedure: NASAL SEPTOPLASTY WITH BILATERAL TURBINATE REDUCTION;  Surgeon: Drema Halon, MD;  Location: [facility] OR;  Service: ENT;  Laterality: Bilateral;   TONSILLECTOMY     UVULECTOMY N/A 01/30/2016   Procedure: Alfredo Martinez;  Surgeon: Drema Halon, MD;  Location: [facility] OR;  Service: ENT;  Laterality: N/A;   Family History  Problem Relation Age of Onset   Alzheimer's disease Mother     Mental illness Mother    Depression Mother    Heart disease Father 58       had 5 MI's & died 68 yo   Hypertension Father    Alcohol abuse Son    Heart attack Son    Stroke Maternal Grandmother    Dementia Maternal Grandmother    Cancer - Prostate Other    Colon cancer Neg Hx    Stomach cancer Neg Hx    Esophageal cancer Neg Hx    Social History   Socioeconomic History   Marital status: Married  Spouse name: [name]    Number of children: 2   Years of education: 13   Highest education level: Not on file  Occupational History   Occupation: Retired  Tobacco Use   Smoking status: Former    Current packs/day: 0.00    Average packs/day: 2.0 packs/day for 25.0 years (50.0 ttl pk-yrs)    Types: Cigarettes    Start date: 05/19/1958    Quit date: 05/20/1983    Years since quitting: 40.1   Smokeless tobacco: Never  Vaping Use   Vaping status: Never Used  Substance and Sexual Activity   Alcohol use: Never   Drug use: Never   Sexual activity: Not Currently  Other Topics Concern   Not on file  Social History Narrative   Lives with wife, [name]   Caffeine use: daily (Coffee)   Social Drivers of Health   Financial Resource Strain: Unknown (07/28/2019)   [facility] [employer] [facility]    5. Do you ever have problems being able to afford everything you need?: Not on file    4. Over the last 6 months, have you had trouble paying your heating or electricity bill?: Not on file  Food Insecurity: No Food Insecurity (06/05/2021)   [facility], [facility]   Hunger Vital Sign    Worried About Running Out of Food in the Last Year: Never true    Ran Out of Food in the Last Year: Never true  Transportation Needs: No Transportation Needs (03/29/2021)   PRAPARE - Administrator, Civil Service (Medical): No    Lack of Transportation (Non-Medical): No  Physical Activity: Not on file  Stress: Not on file  Social  Connections: Unknown (09/18/2021)   Received from [facility]   Social Network    Social Network: Not on file    Tobacco Counseling Counseling given: Not Answered   Clinical Intake:  Pre-visit preparation completed: No  Pain : No/denies pain     BMI - recorded: 36 Nutritional Status: BMI > 30  Obese Nutritional Risks: None Diabetes: No  How often do you need to have someone help you when you read instructions, pamphlets, or other written materials from your doctor or pharmacy?: 1 - Never What is the last grade level you completed in school?: College  Interpreter Needed?: No      Activities of Daily Living    07/16/2023    2:36 PM 09/17/2022    1:00 AM  In your present state of health, do you have any difficulty performing the following activities:  Hearing? 0 0  Vision? 0 0  Difficulty concentrating or making decisions? 1 1  Comment has dementia   Walking or climbing stairs? 0 0  Dressing or bathing? 0 0  Doing errands, shopping? 0 0  Preparing Food and eating ? N   Comment his wife does his meal prep   Using the Toilet? N   In the past six months, have you accidently leaked urine? N   Do you have problems with loss of bowel control? N   Managing your Medications? N   Managing your Finances? Y   Comment wife manages finances since his stroke   Housekeeping or managing your Housekeeping? Y   Comment wife manages finances     Patient Care Team: Suzan Slick, MD as PCP - General (Family Medicine) Regan Lemming, MD as PCP - Electrophysiology (Cardiology) Piedad Climes  Dorris Carnes, MD as Referring Physician (Cardiology) Boneta Lucks Olga Coaster, MD as Referring Physician (Cardiology) Johney Maine, MD as Consulting Physician (Hematology) Lajoyce Corners, [facility] (Inactive) as Pharmacist (Pharmacist) Dohmeier, Porfirio Mylar, MD as Consulting Physician (Neurology)  Indicate any recent Medical Services you may have received from other than [facility] providers  in the past year (date may be approximate).     Assessment:   This is a routine wellness examination for [facility].  Hearing/Vision screen No results found.   Goals Addressed             This Visit's Progress    Activity and Exercise Increased       Evidence-based guidance:  Review current exercise levels.  Assess patient perspective on exercise or activity level, barriers to increasing activity, motivation and readiness for change.  Recommend or set healthy exercise goal based on individual tolerance.  Encourage small steps toward making change in amount of exercise or activity.  Urge reduction of sedentary activities or screen time.  Promote group activities within the community or with family or support person.  Consider referral to rehabiliation therapist for assessment and exercise/activity plan.   Notes:       Depression Screen    06/11/2022    2:06 PM 05/28/2022    1:17 PM 04/16/2022    1:36 PM 07/04/2021    1:11 AM 12/02/2020    9:59 PM 05/08/2020   11:12 PM 10/19/2019   12:58 PM  PHQ 2/9 Scores  PHQ - 2 Score 0 0 0 0 0 0 0  PHQ- 9 Score 2 3 0        Fall Risk    07/16/2023    2:41 PM 06/11/2022    2:06 PM 05/28/2022    1:13 PM 04/16/2022    1:36 PM 07/04/2021    1:11 AM  Fall Risk   Falls in the past year? 0 1 1 1  0  Number falls in past yr: 0 0 0 0   Injury with Fall? 0 0 1 0   Risk for fall due to : No Fall Risks History of fall(s) History of fall(s) History of fall(s) No Fall Risks  Follow up Falls evaluation completed Falls evaluation completed Falls evaluation completed Falls evaluation completed Falls evaluation completed;Education provided;Falls prevention discussed    MEDICARE RISK AT HOME: Medicare Risk at Home Any stairs in or around the home?: No Home free of loose throw rugs in walkways, pet beds, electrical cords, etc?: Yes Adequate lighting in your home to reduce risk of falls?: Yes Life alert?: No Use of a cane, walker or w/c?: No Grab bars  in the bathroom?: Yes Shower chair or bench in shower?: Yes Elevated toilet seat or a handicapped toilet?: Yes  TIMED UP AND GO:  Was the test performed?  Yes  Length of time to ambulate 10 feet: 15 sec Gait slow and steady without use of assistive device    Cognitive Function:    10/22/2022    3:08 PM  MMSE - Mini Mental State Exam  Orientation to time 1  Orientation to Place 3  Registration 3  Attention/ Calculation 1  Recall 0  Language- name 2 objects 2  Language- repeat 1  Language- follow 3 step command 3  Language- read & follow direction 1  Write a sentence 0  Copy design 1  Total score 16        07/16/2023    2:42 PM  6CIT Screen  What Year? 0 points  What month? 0 points  What time? 0 points  Count back from 20 0 points  Months in reverse 2 points  Repeat phrase 10 points  Total Score 12 points    Immunizations Immunization History  Administered Date(s) Administered   Influenza, High Dose Seasonal PF 04/23/2015, 02/18/2017, 03/29/2018, 02/12/2019, 03/08/2021   Influenza,inj,Quad PF,6+ Mos 01/31/2016   Influenza-Unspecified 05/15/2022   Moderna Covid-19 Fall Seasonal Vaccine 69yrs & older 04/29/2022   Moderna SARS-COV2 Booster Vaccination 03/14/2020, 10/11/2020   Moderna Sars-Covid-2 Vaccination 06/18/2019, 07/18/2019, 03/14/2020, 10/11/2020, 02/13/2022   PNEUMOCOCCAL CONJUGATE-20 05/07/2022   Pneumococcal Conjugate-13 11/21/2014   Pneumococcal Polysaccharide-23 06/01/2017   RSV,unspecified 04/29/2022   Zoster Recombinant(Shingrix) 05/07/2022    TDAP status: Up to date  Flu Vaccine status: Up to date  Pneumococcal vaccine status: Up to date  Covid-19 vaccine status: Completed vaccines  Qualifies for Shingles Vaccine? Yes   Zostavax completed Yes   Shingrix Completed?: Yes  Screening Tests Health Maintenance  Topic Date Due   Zoster Vaccines- Shingrix (2 of 2) 07/02/2022   COVID-19 Vaccine (5 - 2024-25 season) 01/18/2023   Medicare  Annual Wellness (AWV)  07/15/2024   Pneumonia Vaccine 52+ Years old  Completed   INFLUENZA VACCINE  Completed   HPV VACCINES  Aged Out   DTaP/Tdap/Td  Discontinued    Health Maintenance  Health Maintenance Due  Topic Date Due   Zoster Vaccines- Shingrix (2 of 2) 07/02/2022   COVID-19 Vaccine (5 - 2024-25 season) 01/18/2023    Colorectal cancer screening: No longer required.   Lung Cancer Screening: (Low Dose CT Chest recommended if Age 57-80 years, 20 pack-year currently smoking OR have quit w/in 15years.) does not qualify.   Lung Cancer Screening Referral: N/A  Additional Screening:  Hepatitis C Screening: does not qualify; Completed N/A  Vision Screening: Recommended annual ophthalmology exams for early detection of glaucoma and other disorders of the eye. Is the patient up to date with their annual eye exam?  Yes  Who is the provider or what is the name of the office in which the patient attends annual eye exams? Unsure name If pt is not established with a provider, would they like to be referred to a provider to establish care? No .   Dental Screening: Recommended annual dental exams for proper oral hygiene  Diabetic Foot Exam: Diabetic Foot Exam: Completed not diabetic  Community Resource Referral / Chronic Care Management: CRR required this visit?  No   CCM required this visit?  No     Plan:     I have personally reviewed and noted the following in the patient's chart:   Medical and social history Use of alcohol, tobacco or illicit drugs  Current medications and supplements including opioid prescriptions. Patient is not currently taking opioid prescriptions. Functional ability and status Nutritional status Physical activity Advanced directives List of other physicians Hospitalizations, surgeries, and ER visits in previous 12 months Vitals Screenings to include cognitive, depression, and falls Referrals and appointments  In addition, I have reviewed and  discussed with patient certain preventive protocols, quality metrics, and best practice recommendations. A written personalized care plan for preventive services as well as general preventive health recommendations were provided to patient.     Suzan Slick, MD   07/16/2023   After Visit Summary: (In Person-Printed) AVS printed and given to the patient  Nurse Notes: N/A

## 2023-07-20 NOTE — Telephone Encounter (Signed)
 [name], pt's wife calling to let Dr Verna Czech nurse know pt only has 11 tablets left of his  ramelteon (ROZEREM) 8 MG tablet. She states this is very important, because it has this needs a prior authorization, which may take several days, and he is almost out. [name] states the dr told her to call when he was down to 14 tabs.  She forgot.

## 2023-07-21 ENCOUNTER — Other Ambulatory Visit: Payer: Self-pay | Admitting: Hematology

## 2023-07-21 ENCOUNTER — Other Ambulatory Visit: Payer: Self-pay | Admitting: Family Medicine

## 2023-07-21 ENCOUNTER — Encounter: Payer: Self-pay | Admitting: Hematology

## 2023-07-21 DIAGNOSIS — I480 Paroxysmal atrial fibrillation: Secondary | ICD-10-CM

## 2023-07-22 ENCOUNTER — Telehealth: Payer: Self-pay

## 2023-07-22 DIAGNOSIS — G4709 Other insomnia: Secondary | ICD-10-CM

## 2023-07-22 DIAGNOSIS — I480 Paroxysmal atrial fibrillation: Secondary | ICD-10-CM

## 2023-07-22 MED ORDER — RAMELTEON 8 MG PO TABS: 8.0000 mg | ORAL_TABLET | Freq: Every day | ORAL | 3 refills | Status: DC

## 2023-07-22 NOTE — Telephone Encounter (Signed)
 Please initiate PA The reason why this medicine is indicated in this patient and no other insomnia medicine is: He does have hx of A fib and due to this condition, many insomnia medicines are cautioned due to tendency of prolonged QT when use is with Aricept. Examples would be Trazodone and Mirtazapine.  Explained to pt and wife that Remelteon is the safest option

## 2023-07-22 NOTE — Telephone Encounter (Signed)
 Copied from CRM [id number]. Topic: Clinical - Prescription Issue >> Jul 22, 2023 10:30 AM Gaetano Hawthorne wrote: Reason for CRM: Patient's spouse was alert that her husband's medication forramelteon 8 mg requires prior authorization through the insurance, they only have  6 tablets left. Patient claims that they left a message on Monday, not sure if it was received - they would like to know if the office has already initiated the prior authorization. Please contact the spouse when you have a moment.

## 2023-07-23 ENCOUNTER — Encounter: Payer: Self-pay | Admitting: *Deleted

## 2023-07-23 NOTE — Telephone Encounter (Addendum)
 Message was received and forwarded for PA initiation. Please inform pt's spouse.

## 2023-07-23 NOTE — Telephone Encounter (Signed)
 Spoke with patients wife and she is aware that the PA has been initiated and well wait for an update of approval.

## 2023-07-23 NOTE — Telephone Encounter (Signed)
 Copied from CRM [id number]. Topic: Clinical - Prescription Issue >> Jul 22, 2023 10:30 AM Gaetano Hawthorne wrote: Reason for CRM: Patient's spouse was alert that her husband's medication forramelteon 8 mg requires prior authorization through the insurance, they only have  6 tablets left. Patient claims that they left a message on Monday, not sure if it was received - they would like to know if the office has already initiated the prior authorization. Please contact the spouse when you have a moment. >> Jul 23, 2023  9:41 AM Fuller Mandril wrote: Patient wife called back in. States he only has 5 pills let. Wanted to check status of PA. Advised have not yet received approval or denial from insurance. Caller would like to be contacted when update is available. Thank You

## 2023-07-28 ENCOUNTER — Ambulatory Visit: Payer: Self-pay | Admitting: Family Medicine

## 2023-07-28 ENCOUNTER — Other Ambulatory Visit: Payer: Self-pay | Admitting: Family Medicine

## 2023-07-28 ENCOUNTER — Telehealth: Payer: Self-pay | Admitting: Family Medicine

## 2023-07-28 ENCOUNTER — Telehealth: Payer: Self-pay

## 2023-07-28 DIAGNOSIS — G4709 Other insomnia: Secondary | ICD-10-CM

## 2023-07-28 DIAGNOSIS — I480 Paroxysmal atrial fibrillation: Secondary | ICD-10-CM

## 2023-07-28 MED ORDER — RAMELTEON 8 MG PO TABS: 8.0000 mg | ORAL_TABLET | Freq: Every day | ORAL | 3 refills | Status: DC

## 2023-07-28 NOTE — Telephone Encounter (Signed)
 [name] [name] (Key: [id number]) - [id number] Ramelteon 8MG  tablets status: PA Response - ApprovedCreated: March 11th, 2025Sent: March 11th, 2025

## 2023-07-28 NOTE — Telephone Encounter (Signed)
 Left messages on home and mobile number that the pharmacy does have refills of Rozerem on file, spoke with Madelin Rear in pharmacy.

## 2023-07-28 NOTE — Telephone Encounter (Signed)
PA has been done and approved. 

## 2023-07-28 NOTE — Telephone Encounter (Signed)
 Pharmacy and patient wife informed.

## 2023-07-28 NOTE — Telephone Encounter (Signed)
 [name] [name] (Key: [id number]) PA Case ID #: [id number] Need Help? Call us at [phone] Status sent iconSent to Plan today Drug Ramelteon 8MG  tablets ePA cloud logo Form [facility] Electronic PA Form

## 2023-07-28 NOTE — Telephone Encounter (Signed)
 Agent will place a refill request for patient. Copied from CRM [id number]. Topic: Clinical - Prescription Issue >> Jul 28, 2023 10:50 AM Nyra Capes wrote: Patient wife, [name] called back in, talking to nurse Dawn (unsure of name)  in Dr. Wyline Mood office when call was dropped, patient has 1 pill left of ramelteon (ROZEREM) 8 MG tablet

## 2023-07-28 NOTE — Telephone Encounter (Signed)
 Copied from CRM [id number]. Topic: Clinical - Medication Refill >> Jul 28, 2023 10:56 AM Nyra Capes wrote: Most Recent Primary Care Visit:  Provider: Suzan Slick  Department[facility]  Visit Type: MEDICARE AWV, SEQUENTIAL  Date: 07/16/2023  Medication: ramelteon (ROZEREM) 8 MG tablet  Has the patient contacted their pharmacy? Yes (Agent: If no, request that the patient contact the pharmacy for the refill. If patient does not wish to contact the pharmacy document the reason why and proceed with request.) (Agent: If yes, when and what did the pharmacy advise?)  Is this the correct pharmacy for this prescription? Yes If no, delete pharmacy and type the correct one.  This is the patient's preferred pharmacy:  [facility] [address] [place] - [address] [place] [address] Phone: [phone] Fax: [phone]  [facility] - [place], [place] - [address] [place] [address] Phone: [phone] Fax: [phone]   Has the prescription been filled recently? Yes  Is the patient out of the medication? 1 pill left  Has the patient been seen for an appointment in the last year OR does the patient have an upcoming appointment? Yes  Can we respond through MyChart? No  Agent: Please be advised that Rx refills may take up to 3 business days. We ask that you follow-up with your pharmacy.   Patient wife has been calling about this for a long time.  Pharmacy stated the pharmacy has denied refill and requires reauthorization, and pharmacy has not gotten the reauthorization from the provider.

## 2023-07-28 NOTE — Telephone Encounter (Signed)
 Wife of patient called in requesting an update on the PA for his ramelteon. Wife has called in multiple times since 07/22/23. Wife advised that patient only has one pill left. This RN called [facility] for an update. This RN was instructed to inform wife/patient that a nurse is currently working on it. This RN advised wife/patient to call back if she does not hear anything by the end of the day.   Reason for Disposition  [1] Caller requesting NON-URGENT health information AND [2] PCP's office is the best resource  Protocols used: Information Only Call - No Triage-A-AH

## 2023-07-28 NOTE — Telephone Encounter (Signed)
 Yes. Pharmacy and pat wife has been notified.

## 2023-07-29 NOTE — Therapy (Signed)
 [facility] [address], [address] [place], [place], [address] Phone: [phone]   Fax:  [phone]  Patient Details  Name: [name] [name] MRN: [id number] Date of Birth: 04-02-1941 Referring Provider:  Sundra Aland, MD  Encounter Date: 07/29/2023  SPEECH THERAPY DISCHARGE SUMMARY  Visits from Start of Care: 7  Current functional level related to goals / functional outcomes: Pt  made minimal progress in 6 therapy sessions. He did not schedule further ST so it is assumed pt no longer desires ST and will be d/c'd at this time. Goals and impression from last scheduled session are below: SHORT TERM GOALS: Target date: 10/30/2022   Pt will sequence steps to operate coffee maker given external supports and spaced retrieval for 2/2 opportunities with occasional max A  Baseline: Goal status: MET   2.  Pt will operate simple functions of television remote (power, volume, channel) with cognitive supports for 2/2 opportunities with occasional max A  Baseline: 11/04/22 Goal status: Met   3.  Pt will effectively use memory aid to name pertinent family members for 2/2 opportunities given occasional max A Baseline:  Goal status: IN PROGRESS   4.  Wife will carryover trained techniques to maximize patient's participation in conversation and home tasks given occasional max A  Baseline:  Goal status: IN PROGRESS     LONG TERM GOALS: Target date: 11/27/2022   Pt will ID personal information in memory book (address, family members, medical providers) with occasional mod A from wife > 1 week Baseline:  Goal status: IN PROGRESS   2.  Pt will be able to carryover targeted functional routines (making coffee, operating remote) with occasional mod A from wife > 1 week Baseline:  Goal status: IN PROGRESS   3.  Wife will appropriately ID and implement necessary memory supports at home to aid cognitive functioning for 2/2 opportunities  Baseline:   Goal status: IN PROGRESS     ASSESSMENT:   CLINICAL IMPRESSION: Patient is a 83 y.o. M who was seen today for cognitive changes s/p recent CVA. PMHX significant for early onset dementia. Prior to stroke, pt was making his own coffee, managing his medications with personalized system and occasional cues from wife, and attending MD appointments alone. Some reduced facial recognition of family members indicated prior to stroke. Since stroke, pt experiencing increased significant difficulty operating coffee makers x2 (pods and pot), using television remote, recalling family members names, and recalling completion of tasks (I.e., washing hair multiple times). See "today's treatment" for more details. SLP and pt/wife agreed to put further ST on hold until consultation with pt's neurologist discussing pt's testing last week (PET, EEG).   Remaining deficits: Assumed that all deficits remain as this is an unexpected d/c.    Education / Equipment: See therapy notes in "Chart Review"   Patient agrees to discharge. Patient goals were partially met. Patient is being discharged due to not returning since the last visit.Marland Kitchen    Zabdiel Dripps, CCC-SLP 07/29/2023, 1:56 PM  [place]  [facility] [address] [place], [place], [address] Phone: [phone]   Fax:  [phone]

## 2023-08-04 NOTE — Telephone Encounter (Signed)
(  KeyLeland Johns - [id number] Ramelteon 8MG  tablets status: PA Response - Approved Created: March 11th, 2025Sent: March 11th, 2025  Patient and pharmacy notified by Laurel Dimmer.

## 2023-08-11 ENCOUNTER — Ambulatory Visit: Payer: Self-pay

## 2023-08-11 NOTE — Telephone Encounter (Signed)
 Copied from CRM [id number]. Topic: Clinical - Red Word Triage >> Aug 11, 2023  1:40 PM [name] [name] wrote: Red Word that prompted transfer to Nurse Triage: Pt has sleep apnea , COPD and Emphysema and his breathing is getting worse. He is not able to sleep (intermittent sleep). Been since the last week and its worse at night. Looking to come in the first week of April.   Chief Complaint: Breathing and sleep apnea "getting worse at night. He wants to talk to his doctor." Declines OV this week. Going out of town, has family member that is dying. Symptoms: SOB at night Frequency: Weeks Pertinent Negatives: Patient denies  Disposition: [] ED /[] Urgent Care (no appt availability in office) / [x] Appointment(In office/virtual)/ []  [facility]/ [] Home Care/ [] Refused Recommended Disposition /[] [facility]/ []  Follow-up with PCP Additional Notes: Will call back for worsening of symptoms.  Reason for Disposition  [1] Longstanding difficulty breathing (e.g., CHF, COPD, emphysema) AND [2] WORSE than normal  Answer Assessment - Initial Assessment Questions 1. RESPIRATORY STATUS: "Describe your breathing?" (e.g., wheezing, shortness of breath, unable to speak, severe coughing)      SOB at night 2. ONSET: "When did this breathing problem begin?"      Getting worse 3. PATTERN "Does the difficult breathing come and go, or has it been constant since it started?"      Comes and goes 4. SEVERITY: "How bad is your breathing?" (e.g., mild, moderate, severe)    - MILD: No SOB at rest, mild SOB with walking, speaks normally in sentences, can lie down, no retractions, pulse < 100.    - MODERATE: SOB at rest, SOB with minimal exertion and prefers to sit, cannot lie down flat, speaks in phrases, mild retractions, audible wheezing, pulse 100-120.    - SEVERE: Very SOB at rest, speaks in single words, struggling to breathe, sitting hunched forward, retractions, pulse > 120      Mild 5. RECURRENT SYMPTOM:  "Have you had difficulty breathing before?" If Yes, ask: "When was the last time?" and "What happened that time?"      Yes 6. CARDIAC HISTORY: "Do you have any history of heart disease?" (e.g., heart attack, angina, bypass surgery, angioplasty)      Yes 7. LUNG HISTORY: "Do you have any history of lung disease?"  (e.g., pulmonary embolus, asthma, emphysema)     COPD, sleep apnea 8. CAUSE: "What do you think is causing the breathing problem?"      Sleep apnea 9. OTHER SYMPTOMS: "Do you have any other symptoms? (e.g., dizziness, runny nose, cough, chest pain, fever)     No 10. O2 SATURATION MONITOR:  "Do you use an oxygen saturation monitor (pulse oximeter) at home?" If Yes, ask: "What is your reading (oxygen level) today?" "What is your usual oxygen saturation reading?" (e.g., 95%)       No 11. PREGNANCY: "Is there any chance you are pregnant?" "When was your last menstrual period?"       N/a 12. TRAVEL: "Have you traveled out of the country in the last month?" (e.g., travel history, exposures)       no  Protocols used: Breathing Difficulty-A-AH

## 2023-08-20 ENCOUNTER — Ambulatory Visit: Admitting: Family Medicine

## 2023-08-21 ENCOUNTER — Other Ambulatory Visit: Payer: Self-pay | Admitting: *Deleted

## 2023-08-21 DIAGNOSIS — I350 Nonrheumatic aortic (valve) stenosis: Secondary | ICD-10-CM

## 2023-08-24 ENCOUNTER — Encounter: Payer: Self-pay | Admitting: Cardiology

## 2023-08-24 ENCOUNTER — Ambulatory Visit: Payer: Medicare Other | Attending: Cardiology | Admitting: Cardiology

## 2023-08-24 VITALS — BP 120/78 | HR 70 | Ht 67.0 in | Wt 230.1 lb

## 2023-08-24 DIAGNOSIS — I4821 Permanent atrial fibrillation: Secondary | ICD-10-CM

## 2023-08-24 DIAGNOSIS — I10 Essential (primary) hypertension: Secondary | ICD-10-CM | POA: Diagnosis not present

## 2023-08-24 DIAGNOSIS — I5022 Chronic systolic (congestive) heart failure: Secondary | ICD-10-CM | POA: Diagnosis not present

## 2023-08-24 DIAGNOSIS — D6869 Other thrombophilia: Secondary | ICD-10-CM | POA: Diagnosis not present

## 2023-08-24 DIAGNOSIS — I2510 Atherosclerotic heart disease of native coronary artery without angina pectoris: Secondary | ICD-10-CM

## 2023-08-24 LAB — CUP PACEART INCLINIC DEVICE CHECK
Date Time Interrogation Session: 20250407165115
HighPow Impedance: 67 Ohm
Implantable Lead Connection Status: 753985
Implantable Lead Connection Status: 753985
Implantable Lead Connection Status: 753985
Implantable Lead Implant Date: 20160311
Implantable Lead Implant Date: 20160311
Implantable Lead Implant Date: 20160311
Implantable Lead Location: 753858
Implantable Lead Location: 753859
Implantable Lead Location: 753860
Implantable Lead Model: 293
Implantable Lead Model: 4674
Implantable Lead Model: 5076
Implantable Lead Serial Number: 343289
Implantable Lead Serial Number: 508939
Implantable Pulse Generator Implant Date: 20160311
Lead Channel Impedance Value: 419 Ohm
Lead Channel Impedance Value: 641 Ohm
Lead Channel Impedance Value: 686 Ohm
Lead Channel Pacing Threshold Amplitude: 0.6 V
Lead Channel Pacing Threshold Amplitude: 0.7 V
Lead Channel Pacing Threshold Amplitude: 1 V
Lead Channel Pacing Threshold Pulse Width: 0.4 ms
Lead Channel Pacing Threshold Pulse Width: 0.4 ms
Lead Channel Pacing Threshold Pulse Width: 0.6 ms
Lead Channel Sensing Intrinsic Amplitude: 24.3 mV
Lead Channel Sensing Intrinsic Amplitude: 25 mV
Lead Channel Sensing Intrinsic Amplitude: 3.5 mV
Lead Channel Setting Pacing Amplitude: 1.6 V
Lead Channel Setting Pacing Amplitude: 2.2 V
Lead Channel Setting Pacing Pulse Width: 0.4 ms
Lead Channel Setting Pacing Pulse Width: 0.6 ms
Lead Channel Setting Sensing Sensitivity: 0.4 mV
Lead Channel Setting Sensing Sensitivity: 1 mV
Pulse Gen Serial Number: 130640

## 2023-08-24 NOTE — Progress Notes (Signed)
  Electrophysiology Office Note:   Date:  08/24/2023  ID:  [name] [name], DOB 08-Aug-1940, MRN [id number]  Primary Cardiologist: Olga Millers, MD Primary Heart Failure: None Electrophysiologist: Gillie Fleites Jorja Loa, MD      History of Present Illness:   [name] [name] is a 83 y.o. male with h/o atrial fibrillation, chronic systolic heart failure, coronary artery disease, CABG, COPD, hypertension, hyperlipidemia, sleep apnea seen today for routine electrophysiology followup.   Since last being seen in our clinic the patient reports doing well.  He has no chest pain or shortness of breath.  He is able to do all of his daily activities.  In May 2024, a CTA.  He has some short-term memory issues from this.  Aside from that, he has no acute complaints..  he denies chest pain, palpitations, dyspnea, PND, orthopnea, nausea, vomiting, dizziness, syncope, edema, weight gain, or early satiety.   Review of systems complete and found to be negative unless listed in HPI.      EP Information / Studies Reviewed:    EKG is not ordered today. EKG from 07/01/2023 reviewed which showed atrial fibrillation, ventricular paced      ICD Interrogation-  reviewed in detail today,  See PACEART report.  Device History: [employer] BiV ICD implanted  for chronic systolic heart failure History of appropriate therapy: No History of AAD therapy: No   Risk Assessment/Calculations:    CHA2DS2-VASc Score = 5   This indicates a 7.2% annual risk of stroke. The patient's score is based upon: CHF History: 1 HTN History: 1 Diabetes History: 0 Stroke History: 0 Vascular Disease History: 1 Age Score: 2 Gender Score: 0            Physical Exam:   VS:  BP 120/78   Pulse 70   Ht 5\' 7"  (1.702 m)   Wt 230 lb 1.3 oz (104.4 kg)   SpO2 95%   BMI 36.04 kg/m    Wt Readings from Last 3 Encounters:  08/24/23 230 lb 1.3 oz (104.4 kg)  07/16/23 231 lb 11.2 oz (105.1 kg)  07/15/23 231 lb 4.8 oz (104.9 kg)      GEN: Well nourished, well developed in no acute distress NECK: No JVD; No carotid bruits CARDIAC: Regular rate and rhythm, no murmurs, rubs, gallops RESPIRATORY:  Clear to auscultation without rales, wheezing or rhonchi  ABDOMEN: Soft, non-tender, non-distended EXTREMITIES:  No edema; No deformity   ASSESSMENT AND PLAN:    Chronic systolic dysfunction s/p Environmental manager CRT-D  euvolemic today Stable on an appropriate medical regimen Normal ICD function Sensing, threshold, impedance within normal limits Programming reviewed and appropriate for patient See Arita Miss Art report No changes today He is nearing generator change.  Risks and benefits have been discussed.  Explained risks, benefits, and alternatives to generator change, including but not limited to bleeding and infection. Pt verbalized understanding and agrees to proceed.  2.  Permanent atrial fibrillation: Well rate controlled.  3.  Secondary hypercoagulable state: Currently on eliquis for atrial fibrillation  4.  Coronary artery disease: Status post CABG in 2015.  No current chest pain.  Plan per primary cardiology.  5.  Hypertension: Well-controlled.  Disposition:   Follow up with Dr. Elberta Fortis as usual post procedure   Signed, Yuki Brunsman Jorja Loa, MD

## 2023-08-27 ENCOUNTER — Other Ambulatory Visit: Payer: Self-pay | Admitting: Family Medicine

## 2023-08-27 DIAGNOSIS — I480 Paroxysmal atrial fibrillation: Secondary | ICD-10-CM

## 2023-08-27 DIAGNOSIS — G4709 Other insomnia: Secondary | ICD-10-CM

## 2023-08-27 MED ORDER — RAMELTEON 8 MG PO TABS: 8.0000 mg | ORAL_TABLET | Freq: Every day | ORAL | 3 refills | Status: DC

## 2023-08-27 NOTE — Telephone Encounter (Signed)
 Copied from CRM [id number]. Topic: Clinical - Medication Refill >> Aug 27, 2023 11:12 AM Ann Held wrote: Most Recent Primary Care Visit:  Provider: Suzan Slick  Department: [facility]  Visit Type: MEDICARE AWV, SEQUENTIAL  Date: 07/16/2023  Medication: ramelteon (ROZEREM) 8 MG tablet  Has the patient contacted their pharmacy? Yes (Agent: If no, request that the patient contact the pharmacy for the refill. If patient does not wish to contact the pharmacy document the reason why and proceed with request.) (Agent: If yes, when and what did the pharmacy advise?)  Is this the correct pharmacy for this prescription? Yes If no, delete pharmacy and type the correct one.  This is the patient's preferred pharmacy:  [facility] [address], [place] - [address] [place] [address] Phone: [phone] Fax: [phone]  [facility] - [place], [place] - [address] [place] [address] Phone: [phone] Fax: [phone]   Has the prescription been filled recently? Yes  Is the patient out of the medication? Yes  Has the patient been seen for an appointment in the last year OR does the patient have an upcoming appointment? Yes  Can we respond through MyChart? Yes  Agent: Please be advised that Rx refills may take up to 3 business days. We ask that you follow-up with your pharmacy.

## 2023-08-31 MED ORDER — RAMELTEON 8 MG PO TABS: 8.0000 mg | ORAL_TABLET | Freq: Every day | ORAL | 3 refills | Status: DC

## 2023-08-31 NOTE — Telephone Encounter (Signed)
 Pt's wife will need to call around to see what pharmacy has this in stock. The pharmacy being out of stock of a medication is out of the provider's hands. I can always send the prescription to another pharmacy if she finds they are in stock of this medicine. She is to let us  know.

## 2023-08-31 NOTE — Telephone Encounter (Signed)
 Called patients wife Adriana Hopping), and left a detailed voice message explaining providers message regarding patients medication ( Rozerem 8 mg),  Advised patient to call other pharmacies to see if they may have medication in stock, and if so, please contact our office with the name of pharmacy so that provider can send Rx to that pharmacy.

## 2023-08-31 NOTE — Telephone Encounter (Signed)
 Copied from CRM [id number]. Topic: Clinical - Prescription Issue >> Aug 31, 2023 10:22 AM Zipporah Him wrote: Reason for CRM:  ramelteon (ROZEREM) 8 MG,  patient is being told every few days that they are out of stock but it will be in 1-2 days. It's been over a week still not in stock. They are wanting to get this ASAP as the patient is out and not able to sleep. Please call because they are very frustrated and do not know how to proceed.  [phone]. >> Aug 31, 2023  1:36 PM Zipporah Him wrote: Patients wife calling to relay that [facility], [phone], has the medication in stock and they would like to pick it up from there.

## 2023-08-31 NOTE — Telephone Encounter (Signed)
 Copied from CRM [id number]. Topic: Clinical - Prescription Issue >> Aug 31, 2023 10:22 AM Zipporah Him wrote: Reason for CRM:  ramelteon (ROZEREM) 8 MG,  patient is being told every few days that they are out of stock but it will be in 1-2 days. It's been over a week still not in stock. They are wanting to get this ASAP as the patient is out and not able to sleep. Please call because they are very frustrated and do not know how to proceed.  [phone].

## 2023-08-31 NOTE — Telephone Encounter (Signed)
 Sent medicine to [facility] pharmacy as pt stated they have the medicine Rozerem 8mg  in stock.

## 2023-09-07 ENCOUNTER — Other Ambulatory Visit: Payer: Self-pay

## 2023-09-07 MED ORDER — DONEPEZIL HCL 10 MG PO TABS: 10.0000 mg | ORAL_TABLET | Freq: Every day | ORAL | 1 refills | Status: DC

## 2023-09-10 ENCOUNTER — Telehealth: Payer: Self-pay

## 2023-09-10 NOTE — Telephone Encounter (Signed)
 Task was completed by provider on 08/31/23.

## 2023-09-10 NOTE — Telephone Encounter (Signed)
 Copied from CRM [id number]. Topic: Clinical - Prescription Issue >> Sep 10, 2023 10:34 AM Lotus Round B wrote: Reason for CRM: pt wife called in to see if Dr.Rucker nurse can approve the prescription ramelteon  (ROZEREM ) 8 MG tablet for [facility] pharmacy for a 60 day supply. If there is anyway if it is possible to do to give her a text [phone]

## 2023-09-10 NOTE — Telephone Encounter (Signed)
 Spoke with patients wife , she informed me that from here on out patients Rx's should be sent to [facility]. She explained that [facility] gave her problems with the prescription for the Ramelton.   Patient also wanted to update provider that patient did have episodes of hallucinations without the medication for sleep . She states that patient is doing better.

## 2023-09-12 ENCOUNTER — Other Ambulatory Visit: Payer: Self-pay | Admitting: Family Medicine

## 2023-09-12 DIAGNOSIS — E039 Hypothyroidism, unspecified: Secondary | ICD-10-CM

## 2023-09-14 NOTE — Telephone Encounter (Signed)
 Request rx rf of lebothyroxine 200mcg Last written 07/06/2023 Last OV 07/16/2023 Upcoming appt = none

## 2023-09-23 ENCOUNTER — Telehealth: Payer: Self-pay

## 2023-09-23 ENCOUNTER — Other Ambulatory Visit: Payer: Self-pay

## 2023-09-23 ENCOUNTER — Ambulatory Visit: Payer: Medicare Other | Admitting: Adult Health

## 2023-09-23 ENCOUNTER — Ambulatory Visit (INDEPENDENT_AMBULATORY_CARE_PROVIDER_SITE_OTHER)

## 2023-09-23 DIAGNOSIS — I255 Ischemic cardiomyopathy: Secondary | ICD-10-CM

## 2023-09-23 DIAGNOSIS — G4709 Other insomnia: Secondary | ICD-10-CM

## 2023-09-23 DIAGNOSIS — K219 Gastro-esophageal reflux disease without esophagitis: Secondary | ICD-10-CM

## 2023-09-23 DIAGNOSIS — I480 Paroxysmal atrial fibrillation: Secondary | ICD-10-CM

## 2023-09-23 LAB — CUP PACEART REMOTE DEVICE CHECK
Battery Remaining Longevity: 3 mo — CL
Battery Remaining Percentage: 2 %
Brady Statistic RA Percent Paced: 0 %
Brady Statistic RV Percent Paced: 95 %
Date Time Interrogation Session: 20250507014100
HighPow Impedance: 71 Ohm
Implantable Lead Connection Status: 753985
Implantable Lead Connection Status: 753985
Implantable Lead Connection Status: 753985
Implantable Lead Implant Date: 20160311
Implantable Lead Implant Date: 20160311
Implantable Lead Implant Date: 20160311
Implantable Lead Location: 753858
Implantable Lead Location: 753859
Implantable Lead Location: 753860
Implantable Lead Model: 293
Implantable Lead Model: 4674
Implantable Lead Model: 5076
Implantable Lead Serial Number: 343289
Implantable Lead Serial Number: 508939
Implantable Pulse Generator Implant Date: 20160311
Lead Channel Impedance Value: 408 Ohm
Lead Channel Impedance Value: 635 Ohm
Lead Channel Impedance Value: 652 Ohm
Lead Channel Pacing Threshold Amplitude: 0.6 V
Lead Channel Pacing Threshold Amplitude: 0.7 V
Lead Channel Pacing Threshold Amplitude: 1 V
Lead Channel Pacing Threshold Pulse Width: 0.4 ms
Lead Channel Pacing Threshold Pulse Width: 0.4 ms
Lead Channel Pacing Threshold Pulse Width: 0.6 ms
Lead Channel Setting Pacing Amplitude: 1.6 V
Lead Channel Setting Pacing Amplitude: 2.2 V
Lead Channel Setting Pacing Pulse Width: 0.4 ms
Lead Channel Setting Pacing Pulse Width: 0.6 ms
Lead Channel Setting Sensing Sensitivity: 0.4 mV
Lead Channel Setting Sensing Sensitivity: 1 mV
Pulse Gen Serial Number: 130640

## 2023-09-23 MED ORDER — RAMELTEON 8 MG PO TABS: 8.0000 mg | ORAL_TABLET | Freq: Every day | ORAL | 3 refills | Status: DC

## 2023-09-23 MED ORDER — OMEPRAZOLE 20 MG PO CPDR: DELAYED_RELEASE_CAPSULE | ORAL | 3 refills | Status: DC

## 2023-09-23 NOTE — Telephone Encounter (Signed)
 Patient wife called [name]'s Ramelteon  and omeprazole . Medication was sent to [facility] as requested. She also , wanted to know if his Omeprazole  could be a higher dosage or was there anything else that could be sent to help with his acid reflux , advised patient message would be sent to provider.    Please Advise

## 2023-09-23 NOTE — Telephone Encounter (Signed)
 Medications was sent   Copied from CRM [id number]. Topic: Clinical - Prescription Issue >> Sep 23, 2023 10:41 AM Hassie Lint wrote: Reason for CRM: Patients wife Adriana Hopping called asking for [facility], stating patient is having an issue with his medications. Attempted to assist but Adriana Hopping would only like to speak to [facility]. Called [facility] but was advised she is currently with a patient. Jestine Moron would send a message.  Adriana Hopping can be reached at [phone]

## 2023-09-24 ENCOUNTER — Ambulatory Visit (HOSPITAL_BASED_OUTPATIENT_CLINIC_OR_DEPARTMENT_OTHER)
Admission: RE | Admit: 2023-09-24 | Discharge: 2023-09-24 | Disposition: A | Discharge status: Home / Self Care | Source: Ambulatory Visit | Attending: Cardiology | Admitting: Cardiology

## 2023-09-24 DIAGNOSIS — I350 Nonrheumatic aortic (valve) stenosis: Secondary | ICD-10-CM | POA: Diagnosis not present

## 2023-09-24 LAB — ECHOCARDIOGRAM COMPLETE
AR max vel: 0.96 cm2
AV Area VTI: 0.94 cm2
AV Area mean vel: 0.93 cm2
AV Mean grad: 36 mmHg
AV Peak grad: 57.8 mmHg
AV Vena cont: 0.35 cm
Ao pk vel: 3.8 m/s
Area-P 1/2: 4.86 cm2
Calc EF: 59 %
MV M vel: 5.28 m/s
MV Peak grad: 111.5 mmHg
MV Vena cont: 0.1 cm
P 1/2 time: 663 ms
Radius: 0.2 cm
S' Lateral: 3.7 cm
Single Plane A2C EF: 58 %
Single Plane A4C EF: 58.6 %

## 2023-09-24 NOTE — Telephone Encounter (Signed)
 LVM on patients wife's phone regarding message about taking Pepcid along with the Omeprazole  .

## 2023-10-22 ENCOUNTER — Ambulatory Visit (INDEPENDENT_AMBULATORY_CARE_PROVIDER_SITE_OTHER)

## 2023-10-22 DIAGNOSIS — I255 Ischemic cardiomyopathy: Secondary | ICD-10-CM

## 2023-10-26 ENCOUNTER — Telehealth: Payer: Self-pay

## 2023-10-26 DIAGNOSIS — I5022 Chronic systolic (congestive) heart failure: Secondary | ICD-10-CM

## 2023-10-26 NOTE — Telephone Encounter (Signed)
 Monthly battery check.  ERI triggered 10/22/23

## 2023-10-27 NOTE — Telephone Encounter (Signed)
 Spoke with patient's wife (okay per DPR).  They are aware to expect a call from [facility] regarding the appt with Dr. Lawana Pray to discuss change out.

## 2023-10-28 NOTE — Telephone Encounter (Signed)
 Great catch, usually I see that, not sure how I missed the conversation.  Thanks.   No nothing further needed for in office with [place].  Okay to move forward with scheduling gen change.  Thanks everyone.

## 2023-11-03 NOTE — Telephone Encounter (Signed)
 LM for pt (Wife) to call back to schedule BiV-ICD Gen Change with Dr. Lawana Pray. ERI 6/5  Pt will need labs & surgical scrub.

## 2023-11-03 NOTE — Progress Notes (Signed)
 Remote ICD transmission.

## 2023-11-03 NOTE — Addendum Note (Signed)
 Addended by: Marytza Grandpre on: 11/03/2023 10:22 AM   Modules accepted: Orders

## 2023-11-03 NOTE — Telephone Encounter (Signed)
 Pt's Wife returned my call - he is scheduled for BiV-ICD Gen Change with Dr. Lawana Pray on 8/28 at 3:00 PM.   Pt's Wife will take him to [facility] for labs and to pick up surgical scrub.  Pt has dementia and she is requesting they use local anesthesia so it doesn't worsen his dementia. I informed her that I would speak with Dr. Lawana Pray about this and have his RN call her back to discuss it.   Sherri - Can you please leave a bottle of scrub at the front desk of [facility] for pt to pick up on 8/5 while there for labs??

## 2023-11-16 ENCOUNTER — Other Ambulatory Visit: Payer: Self-pay

## 2023-11-16 DIAGNOSIS — C9110 Chronic lymphocytic leukemia of B-cell type not having achieved remission: Secondary | ICD-10-CM

## 2023-11-17 ENCOUNTER — Inpatient Hospital Stay: Payer: Medicare Other | Attending: Hematology

## 2023-11-17 ENCOUNTER — Inpatient Hospital Stay (HOSPITAL_BASED_OUTPATIENT_CLINIC_OR_DEPARTMENT_OTHER): Payer: Medicare Other | Admitting: Hematology

## 2023-11-17 VITALS — BP 113/98 | HR 59 | Temp 97.0°F | Resp 18 | Wt 239.9 lb

## 2023-11-17 DIAGNOSIS — D45 Polycythemia vera: Secondary | ICD-10-CM

## 2023-11-17 DIAGNOSIS — D72829 Elevated white blood cell count, unspecified: Secondary | ICD-10-CM | POA: Diagnosis not present

## 2023-11-17 DIAGNOSIS — E039 Hypothyroidism, unspecified: Secondary | ICD-10-CM | POA: Insufficient documentation

## 2023-11-17 DIAGNOSIS — Z79890 Hormone replacement therapy (postmenopausal): Secondary | ICD-10-CM | POA: Diagnosis not present

## 2023-11-17 DIAGNOSIS — C9110 Chronic lymphocytic leukemia of B-cell type not having achieved remission: Secondary | ICD-10-CM

## 2023-11-17 DIAGNOSIS — Z79899 Other long term (current) drug therapy: Secondary | ICD-10-CM | POA: Insufficient documentation

## 2023-11-17 DIAGNOSIS — Z7982 Long term (current) use of aspirin: Secondary | ICD-10-CM | POA: Diagnosis not present

## 2023-11-17 LAB — CBC WITH DIFFERENTIAL (CANCER CENTER ONLY)
Abs Immature Granulocytes: 0.1 10*3/uL — ABNORMAL HIGH (ref 0.00–0.07)
Basophils Absolute: 0.1 10*3/uL (ref 0.0–0.1)
Basophils Relative: 1 %
Eosinophils Absolute: 0.1 10*3/uL (ref 0.0–0.5)
Eosinophils Relative: 1 %
HCT: 42.4 % (ref 39.0–52.0)
Hemoglobin: 14.3 g/dL (ref 13.0–17.0)
Immature Granulocytes: 1 %
Lymphocytes Relative: 7 %
Lymphs Abs: 1 10*3/uL (ref 0.7–4.0)
MCH: 37.8 pg — ABNORMAL HIGH (ref 26.0–34.0)
MCHC: 33.7 g/dL (ref 30.0–36.0)
MCV: 112.2 fL — ABNORMAL HIGH (ref 80.0–100.0)
Monocytes Absolute: 0.4 10*3/uL (ref 0.1–1.0)
Monocytes Relative: 2 %
Neutro Abs: 13.4 10*3/uL — ABNORMAL HIGH (ref 1.7–7.7)
Neutrophils Relative %: 88 %
Platelet Count: 435 10*3/uL — ABNORMAL HIGH (ref 150–400)
RBC: 3.78 MIL/uL — ABNORMAL LOW (ref 4.22–5.81)
RDW: 14.6 % (ref 11.5–15.5)
WBC Count: 15.1 10*3/uL — ABNORMAL HIGH (ref 4.0–10.5)
nRBC: 0 % (ref 0.0–0.2)

## 2023-11-17 LAB — CMP (CANCER CENTER ONLY)
ALT: 14 U/L (ref 0–44)
AST: 22 U/L (ref 15–41)
Albumin: 4.3 g/dL (ref 3.5–5.0)
Alkaline Phosphatase: 58 U/L (ref 38–126)
Anion gap: 6 (ref 5–15)
BUN: 19 mg/dL (ref 8–23)
CO2: 34 mmol/L — ABNORMAL HIGH (ref 22–32)
Calcium: 9.3 mg/dL (ref 8.9–10.3)
Chloride: 100 mmol/L (ref 98–111)
Creatinine: 1.03 mg/dL (ref 0.61–1.24)
GFR, Estimated: >60 mL/min (ref >60)
Glucose, Bld: 132 mg/dL — ABNORMAL HIGH (ref 70–99)
Potassium: 3.7 mmol/L (ref 3.5–5.1)
Sodium: 140 mmol/L (ref 135–145)
Total Bilirubin: 1.6 mg/dL — ABNORMAL HIGH (ref 0.0–1.2)
Total Protein: 7 g/dL (ref 6.5–8.1)

## 2023-11-17 NOTE — Progress Notes (Signed)
 HEMATOLOGY/ONCOLOGY CLINIC VISIT NOTE  Date of Service: 11/17/23   Patient Care Team: Colette Torrence GRADE, MD as PCP - General (Family Medicine) Inocencio Soyla Lunger, MD as PCP - Electrophysiology (Cardiology) Pietro Redell RAMAN, MD as PCP - Cardiology (Cardiology) Autry, Ozell SAILOR, MD as Referring Physician (Cardiology) Jacqualyn Vinie NOVAK, MD as Referring Physician (Cardiology) Onesimo Emaline Brink, MD as Consulting Physician (Hematology) Nathanael Davies, [facility] (Inactive) as Pharmacist (Pharmacist) Dohmeier, Dedra, MD as Consulting Physician (Neurology)  CHIEF COMPLAINT F/u for continued evaluation and mx of polycythemia vera  HISTORY OF PRESENTING ILLNESS:  Please see previous note for details on initial presentation  INTERVAL HISTORY:   [name] [name] is  83 y.o. Male here for continued evaluation and management of [name].   Patient was last seen by me on 07/15/2023 and it was noted that he had slower breathing with naps, possible sinus infections 2 months prior to the visit and mild rhinorrhea.   He is accompanied by his wife during today's visit. Patient has been doing well overall over the last 4 months.   Patient's wife notes that he has gained 9 pounds recently. His weight in clinic is noted to be 239 pounds.   His wife notes that patient has some spots on his forehead. Patient is noted to have age-related spots. He notes that he has never needed to have any skin lesions removed. He denies any hx of melanoma in the past. He does not follow with a dermatologist.   Patient denies any new medication changes.   His wife notes that he has been reading more quickly. Patient's wife also reports that he has mild hallucinations.   Patient complains of sleeping/insomnia issues over the last 2 nights.   He continues to take Rozerem  8 MG for sleep every night.   His wife notes that Aricept  and Memantine  have been improving his memory.   He  denies any other new concerns.   Patient reports that he has been taking  two tablets of hydroxyurea  daily.   He notes arthritis/hand swelling.   Patient complains of having stomach ache for a couple of months. His wife notes that patient's abdominal pain is related to indigestion. He continues to take Omeprazole . Patient denies any abdominal pain on palpation. He denies abdominal pain with hunger.   He denies any constipation or diarrhea. He reports black, tar-like stools, which is a new symptom. Patient is noted to be taking Pepto-Bismol since 3-4 days ago, and has had black stools since starting this. His wife notes that his stools previosuly were normal in color prior to being on pepto-bismol.   He does regularly take probiotics every morning.   His wife notes that he is having BiV-ICD Gen Change on January 14 2024, after 9 years of being in place.   He denies any infection issues.   He notes that he plans to travel to [place]  soon.   MEDICAL HISTORY:  Past Medical History:  Diagnosis Date   A-fib (HCC) 01/2014   AICD (automatic cardioverter/defibrillator) present    [employer]   Anal fissure 11/10/2019   Arthritis    ASHD (arteriosclerotic heart disease) 2015   s/p CABG   Cataract    s/p left   CHF (congestive heart failure) (HCC)    COPD (chronic obstructive pulmonary disease) (HCC)    by CXR, pt unsure of this   Diverticulosis    GERD (gastroesophageal reflux disease)    Gout 2014  Hyperlipidemia    Hypertension    Hypothyroidism    LBBB (left bundle branch block)    Polycythemia vera (HCC)    Prediabetes 01/2014   Presence of permanent cardiac pacemaker    Sleep apnea    no CPAP    SURGICAL HISTORY: Past Surgical History:  Procedure Laterality Date   CARDIAC DEFIBRILLATOR PLACEMENT  07/2014   CATARACT EXTRACTION W/ INTRAOCULAR LENS IMPLANT Left    COLONOSCOPY  08/26/2022   normal   CORONARY ARTERY BYPASS GRAFT  11/2013    ESOPHAGOGASTRODUODENOSCOPY  08/26/2022   esophageal dilation   lumb laminectomy  8014,8004,7992   x 3   LUMBAR FUSION  2013   NASAL SEPTOPLASTY W/ TURBINOPLASTY Bilateral 01/30/2016   Procedure: NASAL SEPTOPLASTY WITH BILATERAL TURBINATE REDUCTION;  Surgeon: Lonni FORBES Angle, MD;  Location: [facility] OR;  Service: ENT;  Laterality: Bilateral;   TONSILLECTOMY     UVULECTOMY N/A 01/30/2016   Procedure: OPHELIA;  Surgeon: Lonni FORBES Angle, MD;  Location: [facility] OR;  Service: ENT;  Laterality: N/A;    SOCIAL HISTORY: Social History   Socioeconomic History   Marital status: Married    Spouse name: [name]    Number of children: 2   Years of education: 13   Highest education level: Not on file  Occupational History   Occupation: Retired  Tobacco Use   Smoking status: Former    Current packs/day: 0.00    Average packs/day: 2.0 packs/day for 25.0 years (50.0 ttl pk-yrs)    Types: Cigarettes    Start date: 05/19/1958    Quit date: 05/20/1983    Years since quitting: 40.5   Smokeless tobacco: Never  Vaping Use   Vaping status: Never Used  Substance and Sexual Activity   Alcohol use: Never   Drug use: Never   Sexual activity: Not Currently  Other Topics Concern   Not on file  Social History Narrative   Lives with wife, [name]   Caffeine use: daily (Coffee)   Social Drivers of Health   Financial Resource Strain: Unknown (07/28/2019)   Received from [facility] [employer] St[facility]    5. Do you ever have problems being able to afford everything you need?: Not on file    4. Over the last 6 months, have you had trouble paying your heating or electricity bill?: Not on file  Food Insecurity: No Food Insecurity (06/05/2021)   Received from [facility]   Hunger Vital Sign    Within the past 12 months, you worried that your food would run out before you got the money to buy more.: Never true    Within the past 12 months, the food you bought just didn't last and you didn't have  money to get more.: Never true  Transportation Needs: No Transportation Needs (03/29/2021)   PRAPARE - Administrator, Civil Service (Medical): No    Lack of Transportation (Non-Medical): No  Physical Activity: Not on file  Stress: Not on file  Social Connections: Unknown (09/18/2021)   Received from [facility]   Social Network    Social Network: Not on file  Intimate Partner Violence: Unknown (08/20/2021)   Received from [facility]   HITS    Physically Hurt: Not on file    Insult or Talk Down To: Not on file    Threaten Physical Harm: Not on file    Scream or Curse: Not on file    FAMILY HISTORY: Family History  Problem Relation  Age of Onset   Alzheimer's disease Mother    Mental illness Mother    Depression Mother    Heart disease Father 48       had 5 MI's & died 68 yo   Hypertension Father    Alcohol abuse Son    Heart attack Son    Stroke Maternal Grandmother    Dementia Maternal Grandmother    Cancer - Prostate Other    Colon cancer Neg Hx    Stomach cancer Neg Hx    Esophageal cancer Neg Hx     ALLERGIES:  is allergic to other.  MEDICATIONS:  Current Outpatient Medications  Medication Sig Dispense Refill   acetaminophen  (TYLENOL ) 500 MG tablet Take 500 mg by mouth as needed for mild pain.     allopurinol  (ZYLOPRIM ) 300 MG tablet TAKE 1 TABLET EVERY DAY TO PREVENT GOUT 90 tablet 3   aspirin  EC 81 MG tablet Take 81 mg by mouth daily.     Choline Dihydrogen Citrate (CHOLINE CITRATE PO) Take by mouth.     Cyanocobalamin  (B-12) 1000 MCG CAPS Take 1 capsule by mouth daily.     Docosahexaenoic Acid (DHA OMEGA 3 PO) Take 1 tablet by mouth daily.     docusate sodium  (COLACE) 100 MG capsule Take 100 mg by mouth 2 (two) times daily. 1 tablet in the morning and 1 tablet at bedtime (stool softener)     donepezil  (ARICEPT ) 10 MG tablet Take 1 tablet (10 mg total) by mouth at bedtime. 90 tablet 1   ELIQUIS  5 MG TABS tablet TAKE 1 TABLET TWICE DAILY 180 tablet  3   ezetimibe  (ZETIA ) 10 MG tablet TAKE 1 TABLET EVERY DAY 90 tablet 3   finasteride  (PROSCAR ) 5 MG tablet TAKE 1 TABLET EVERY DAY 90 tablet 3   hydroxyurea  (HYDREA ) 500 MG capsule TAKE 2 CAPSULES ONE TIME DAILY WITH FOOD 180 capsule 3   isosorbide  mononitrate (IMDUR ) 30 MG 24 hr tablet TAKE 1 TABLET EVERY DAY 90 tablet 3   levothyroxine  (SYNTHROID ) 200 MCG tablet TAKE 1 TAB DAILY ON EMPTY STOMACH WITH ONLY WATER FOR 30 MINS. NO ANTACIDS, CALCIUM  OR MAGNESIUM  FOR 4 HOURS. AVOID BIOTIN 90 tablet 1   magnesium  oxide (MAG-OX) 400 MG tablet Take 400 mg by mouth daily.     MELATONIN PO Take 1 tablet by mouth daily as needed (For sleep).     memantine  (NAMENDA ) 10 MG tablet Start with one tab at bedtime. In 2 weeks if doing well can increase to 1 tab twice daily. 60 tablet 0   metoprolol  (TOPROL -XL) 200 MG 24 hr tablet Take 200 mg by mouth daily.     Multiple Vitamin (MULTIVITAMIN) tablet Take 1 tablet by mouth daily.     omeprazole  (PRILOSEC) 20 MG capsule TAKE 1 CAPSULE TWICE DAILY  ( EVERY 12 HOURS  ) TO PREVENT HEARTBURN AND ACID REFLUX 180 capsule 3   Probiotic Product (PROBIOTIC-10 PO) Take 1 tablet by mouth daily.     ramelteon  (ROZEREM ) 8 MG tablet Take 1 tablet (8 mg total) by mouth at bedtime. 30 tablet 3   rosuvastatin  (CRESTOR ) 40 MG tablet TAKE 1 TABLET BY MOUTH DAILY. 90 tablet 3   tamsulosin  (FLOMAX ) 0.4 MG CAPS capsule TAKE 1 CAPSULE AT BEDTIME FOR PROSTATE AND BLADDER 90 capsule 3   torsemide  (DEMADEX ) 20 MG tablet TAKE 1 TABLET EVERY MORNING FOR SWELLING IN LEGS 90 tablet 3   UNABLE TO FIND Med Name: Tumeric     No  current facility-administered medications for this visit.    REVIEW OF SYSTEMS:    10 Point review of Systems was done is negative except as noted above.   PHYSICAL EXAMINATION:  .There were no vitals taken for this visit.    GENERAL:alert, in no acute distress and comfortable SKIN: no acute rashes, no significant lesions EYES: conjunctiva are pink and  non-injected, sclera anicteric OROPHARYNX: MMM, no exudates, no oropharyngeal erythema or ulceration NECK: supple, no JVD LYMPH:  no palpable lymphadenopathy in the cervical, axillary or inguinal regions LUNGS: clear to auscultation b/l with normal respiratory effort HEART: regular rate & rhythm ABDOMEN:  normoactive bowel sounds , non tender, not distended. Extremity: no pedal edema PSYCH: alert & oriented x 3 with fluent speech NEURO: no focal motor/sensory deficits     LABORATORY DATA:  I have reviewed the data as listed  .    Latest Ref Rng & Units 07/15/2023   12:47 PM 02/20/2023   12:56 PM 09/23/2022    3:16 PM  CBC  WBC 4.0 - 10.5 K/uL 13.9  11.5  10.4   Hemoglobin 13.0 - 17.0 g/dL 84.4  84.4  84.8   Hematocrit 39.0 - 52.0 % 46.6  48.5  43.0   Platelets 150 - 400 K/uL 353  324  486     .    Latest Ref Rng & Units 07/15/2023   12:47 PM 02/20/2023   12:56 PM 09/23/2022    3:16 PM  CMP  Glucose 70 - 99 mg/dL 842  852  880   BUN 8 - 23 mg/dL 21  17  14    Creatinine 0.61 - 1.24 mg/dL 8.91  8.96  8.94   Sodium 135 - 145 mmol/L 142  139  136   Potassium 3.5 - 5.1 mmol/L 3.5  3.6  5.3   Chloride 98 - 111 mmol/L 104  100  95   CO2 22 - 32 mmol/L 32  33  27   Calcium  8.9 - 10.3 mg/dL 9.4  9.4  9.5   Total Protein 6.5 - 8.1 g/dL 6.7  6.6  6.7   Total Bilirubin 0.0 - 1.2 mg/dL 1.4  1.4  1.1   Alkaline Phos 38 - 126 U/L 59  57  77   AST 15 - 41 U/L 22  22  33   ALT 0 - 44 U/L 14  18  44     02/09/18 JAK2 Mutation Study:    02/09/18 BCR ABL FISH:    RADIOGRAPHIC STUDIES: I have personally reviewed the radiological images as listed and agreed with the findings in the report. No results found.  ASSESSMENT & PLAN:   83 y.o. male with  1. 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 Some associated leukocytosis. No history of splenectomy. No previous h/o VTE, but  significant cardiac risk factors. -Patient's aspirin  and Warfarin has been protective against vascular events   02/09/18 JAK2 mutation study revealed the JAK 2 mutation Val617Phe at 75% allele frequency consistent with Essential thrombocytosis.   02/09/18 BCR ABL FISH revealed no evidence of BCR/ABL rearrangement   2. . 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) 12/15/2022   Acute CVA (cerebrovascular accident) (HCC) 09/16/2022  Chronic diastolic CHF (congestive heart failure) (HCC) 09/16/2022   Encounter for screening colonoscopy 08/26/2022   Chronic ulcer of left foot with fat layer exposed (HCC) 07/10/2022   Zenker's diverticulum 10/16/2021   Loss of appetite 10/10/2021   Loss of weight 10/10/2021   Generalized abdominal pain 10/10/2021   Thrombosed external hemorrhoid 10/10/2021   Dysphagia 08/01/2021   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   Intolerance of continuous positive airway pressure (CPAP) ventilation 05/02/2021   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   JAK2 V617F mutation 08/27/2020   Rectal pain 11/10/2019   Constipation 11/10/2019   Abnormal glucose 10/12/2019   Memory changes 01/10/2019   Polycythemia vera (HCC) 10/21/2018   Thrombocytosis 12/29/2017   Senile purpura (HCC) 09/01/2017   Emphysema of lung (HCC) 06/01/2017   Tortuous aorta (HCC)- CXR 2018 06/01/2017   Regular astigmatism, right eye 10/16/2016   Combined forms of age-related cataract of right eye 10/16/2016   Trigger ring finger of left hand 08/06/2016   Bilateral carpal tunnel syndrome 08/06/2016   Ischemic cardiomyopathy 06/22/2015   ASHD/CABG x 3V (11/2013) 02/06/2015   Acquired thrombophilia (HCC) 02/06/2015   Cardiac pacemaker/AICD (01/2014) 02/06/2015   Morbid obesity  (HCC) 01/03/2015   HTN (hypertension) 01/02/2015   Hyperlipidemia 01/02/2015   Vitamin D  deficiency 01/02/2015   GERD  01/02/2015   BPH (benign prostatic hyperplasia) 01/02/2015   Medication management 01/02/2015   Atrial fibrillation (HCC) 01/02/2015   Hypothyroidism 01/02/2015   OSA and COPD overlap syndrome (HCC) 01/02/2015   Chronic systolic (congestive) heart failure (HCC) 02/17/2014   S/P CABG x 3 02/13/2014   Atherosclerotic heart disease of native coronary artery without angina pectoris 02/08/2014   Gout 02/08/2014   Cardiomyopathy (HCC) 02/08/2014   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) 02/06/2014  -continue f/u with PCP to optimize atherosclerotic risk factors.  PLAN:  -Discussed lab results on 11/17/23  in detail with patient. CBC normal, showed WBC of 15.1K, hemoglobin of 14.3, HCT 42%, and platelets of 435K. -hgb normal -HCT normal -platelets are okay -CMP suggests mild dehydration, otherwise very stable -did not feel any enlarged lymph nodes during physical examination -patient has been tolerating 1000 mg (two tablets) p.o. daily of Hydroxyurea  with no notable toxicities -he shall continue to take 1000 MG Hydroxyurea  daily as it is controlling his blood counts well -there is no indication for therapeutic  phlebotomy at this time -continue eliquis  at current dose -discussed that sleep deprivation can cause cognitive changes -discussed that differences in speech and environmental changes can be disorienting and can contribute to hallucinations -recommend establishing a routine and controlling as many orienting elements throughout the day as he can to help improve hallucinations.  -discussed that there can be more skin sensitivity with hydroxyurea  and sun exposure -patient has been tolerating hydroxyurea  with no major toxicities -there does not appear to be a single facial lesion appearing ulcerated -discussed that pepto-bismol can cause black  stools -patient has no anemia -discussed that if his stools continue to be very black, especially if they are tar-like or oily, there could be concern for bleeding in the upper GI tract -discussed if his black stools are persistent, we would recommend evaluation by his PCP -okay to continue omeprazole  twice a day -continue probiotics -recommend consuming simple-foods in the diet -recommend wearing loose clothing -recommend patient to stay well hydrated with at least 2L of water daily -discussed that his cardiologist shall adjust his blood thinners to allow for his upcoming BiV-ICD Gen Change  on January 14 2024 with Dr. Inocencio -he shall return to clinic in 4 months  FOLLOW-UP: RTC with Dr Onesimo with labs in 4 months  The total time spent in the appointment was *** minutes* .  All of the patient's questions were answered with apparent satisfaction. The patient knows to call the clinic with any problems, questions or concerns.   Emaline Onesimo MD MS AAHIVMS [facility] Hematology/Oncology Physician [facility]  .*Total Encounter Time as defined by the Centers for Medicare and Medicaid Services includes, in addition to the face-to-face time of a patient visit (documented in the note above) non-face-to-face time: obtaining and reviewing outside history, ordering and reviewing medications, tests or procedures, care coordination (communications with other health care professionals or caregivers) and documentation in the medical record.   I,Mitra Faeizi,acting as a Neurosurgeon for Emaline Onesimo, MD.,have documented all relevant documentation on the behalf of Emaline Onesimo, MD,as directed by  Emaline Onesimo, MD while in the presence of Emaline Onesimo, MD.  ***

## 2023-11-18 ENCOUNTER — Encounter: Payer: Self-pay | Admitting: Hematology

## 2023-11-19 ENCOUNTER — Other Ambulatory Visit: Payer: Self-pay | Admitting: Family Medicine

## 2023-11-19 DIAGNOSIS — G4709 Other insomnia: Secondary | ICD-10-CM

## 2023-11-19 DIAGNOSIS — E039 Hypothyroidism, unspecified: Secondary | ICD-10-CM

## 2023-11-19 DIAGNOSIS — I480 Paroxysmal atrial fibrillation: Secondary | ICD-10-CM

## 2023-11-26 ENCOUNTER — Ambulatory Visit

## 2023-11-26 ENCOUNTER — Telehealth: Payer: Self-pay | Admitting: Family Medicine

## 2023-11-26 DIAGNOSIS — I255 Ischemic cardiomyopathy: Secondary | ICD-10-CM | POA: Diagnosis not present

## 2023-11-26 LAB — CUP PACEART REMOTE DEVICE CHECK
Brady Statistic RA Percent Paced: 0 %
Brady Statistic RV Percent Paced: 94 %
Date Time Interrogation Session: 20250710014000
HighPow Impedance: 66 Ohm
Implantable Lead Connection Status: 753985
Implantable Lead Connection Status: 753985
Implantable Lead Connection Status: 753985
Implantable Lead Implant Date: 20160311
Implantable Lead Implant Date: 20160311
Implantable Lead Implant Date: 20160311
Implantable Lead Location: 753858
Implantable Lead Location: 753859
Implantable Lead Location: 753860
Implantable Lead Model: 293
Implantable Lead Model: 4674
Implantable Lead Model: 5076
Implantable Lead Serial Number: 343289
Implantable Lead Serial Number: 508939
Implantable Pulse Generator Implant Date: 20160311
Lead Channel Impedance Value: 393 Ohm
Lead Channel Impedance Value: 592 Ohm
Lead Channel Impedance Value: 613 Ohm
Lead Channel Pacing Threshold Amplitude: 0.6 V
Lead Channel Pacing Threshold Amplitude: 0.7 V
Lead Channel Pacing Threshold Amplitude: 1 V
Lead Channel Pacing Threshold Pulse Width: 0.4 ms
Lead Channel Pacing Threshold Pulse Width: 0.4 ms
Lead Channel Pacing Threshold Pulse Width: 0.6 ms
Lead Channel Setting Pacing Amplitude: 1.6 V
Lead Channel Setting Pacing Amplitude: 2.2 V
Lead Channel Setting Pacing Pulse Width: 0.4 ms
Lead Channel Setting Pacing Pulse Width: 0.6 ms
Lead Channel Setting Sensing Sensitivity: 0.4 mV
Lead Channel Setting Sensing Sensitivity: 1 mV
Pulse Gen Serial Number: 130640

## 2023-11-26 NOTE — Telephone Encounter (Signed)
 Copied from CRM [id number]. Topic: Clinical - Medication Question >> Nov 26, 2023  8:46 AM Carlatta H wrote: Reason for CRM: Patient spouse would like to know if primatene  mist is ok for the patient to take//They are also asking for a dosage increase for ramelteon  (ROZEREM ) 8 MG tablet [[id number]]//Please call [name] to advise

## 2023-11-26 NOTE — Telephone Encounter (Signed)
 Patient's wife has been notified of the provider's recommendation. Voiced understanding. She would like to know if nasal sprays can be used in replacement of the Primatene  mist? Patient tends to get stuffy at bedtime. Please advise, thanks.

## 2023-11-26 NOTE — Telephone Encounter (Signed)
 Pt is already on maximum dose of Rozerem . I would avoid Primatene  mist as it can cause insomnia.

## 2023-12-02 NOTE — Telephone Encounter (Signed)
 Left a vm msg regarding the provider's recommendation. Direct call back info provided.

## 2023-12-06 ENCOUNTER — Other Ambulatory Visit: Payer: Self-pay | Admitting: Family Medicine

## 2023-12-06 DIAGNOSIS — I480 Paroxysmal atrial fibrillation: Secondary | ICD-10-CM

## 2023-12-06 DIAGNOSIS — G4709 Other insomnia: Secondary | ICD-10-CM

## 2023-12-09 ENCOUNTER — Telehealth: Payer: Self-pay

## 2023-12-09 MED ORDER — METOPROLOL SUCCINATE ER 200 MG PO TB24: 200.0000 mg | ORAL_TABLET | Freq: Every day | ORAL | 0 refills | Status: DC

## 2023-12-09 NOTE — Telephone Encounter (Signed)
 Please let pt know that I spoke with his cardiologist Dr Pietro and he advised they continue seeing him with pt's cardiac history. I have refilled the Metoprolol  this time but do need to schedule and follow up with Dr Pietro going forward. During their last appt with Dr Pietro in February, he advised them to follow up in 6 months. Needs to make an appointment with him for August.

## 2023-12-09 NOTE — Addendum Note (Signed)
 Addended by: COLETTE TORRENCE GRADE on: 12/09/2023 11:15 AM   Modules accepted: Orders

## 2023-12-09 NOTE — Telephone Encounter (Signed)
 Patient wife called and wanted to know if you would be able to refill the Metoprolol  200 mg -24 hr tabs, she states that they were previously filled by his cardiologist at the time, but he no longer see's that provider   If you can refill it she would like medication sent to   [facility]  [address] [place] , [place] [address]  Pharmacy is in patients chart

## 2023-12-10 NOTE — Progress Notes (Signed)
 Remote ICD transmission.

## 2023-12-17 NOTE — Progress Notes (Unsigned)
 HPI: FU congestive heart failure. Patient was previously followed by Vinie Loop at [facility]. Patient has a history of coronary artery disease and had coronary artery bypass and graft in 2015. He also has a history of permanent atrial fibrillation, aortic stenosis, biventricular ICD and heart failure with improved ejection fraction. CT of the abdomen June 2023 showed no aneurysm. Patient had CVA May 2024.  Seen by neurology and Coumadin  changed to apixaban  and aspirin  81 mg daily added.  Echocardiogram repeated May 2025 which showed normal LV function, mild right ventricular enlargement, severe biatrial enlargement, mild mitral regurgitation, moderate tricuspid regurgitation, mild aortic insufficiency and moderate to severe aortic stenosis with mean gradient 36 mmHg and aortic valve area 0.94 cm.  Since last seen, he has some dyspnea with activities unchanged by his report.  He denies chest pain or syncope.  He has chronic mild pedal edema.    Current Outpatient Medications  Medication Sig Dispense Refill   acetaminophen  (TYLENOL ) 500 MG tablet Take 500 mg by mouth as needed for mild pain.     allopurinol  (ZYLOPRIM ) 300 MG tablet TAKE 1 TABLET EVERY DAY TO PREVENT GOUT 90 tablet 3   aspirin  EC 81 MG tablet Take 81 mg by mouth daily.     Cholecalciferol (D3 PO) Take 5,000 Units by mouth daily at 6 (six) AM.     Choline Dihydrogen Citrate (CHOLINE CITRATE PO) Take by mouth.     Cyanocobalamin  (B-12) 1000 MCG CAPS Take 1 capsule by mouth daily.     docusate sodium  (COLACE) 100 MG capsule Take 100 mg by mouth 2 (two) times daily. 1 tablet in the morning and 1 tablet at bedtime (stool softener)     donepezil  (ARICEPT ) 10 MG tablet Take 1 tablet (10 mg total) by mouth at bedtime. 90 tablet 1   ELIQUIS  5 MG TABS tablet TAKE 1 TABLET TWICE DAILY 180 tablet 3   ezetimibe  (ZETIA ) 10 MG tablet TAKE 1 TABLET EVERY DAY 90 tablet 3   finasteride  (PROSCAR ) 5 MG tablet TAKE 1 TABLET EVERY DAY 90 tablet 3    hydroxyurea  (HYDREA ) 500 MG capsule TAKE 2 CAPSULES ONE TIME DAILY WITH FOOD 180 capsule 3   isosorbide  mononitrate (IMDUR ) 30 MG 24 hr tablet TAKE 1 TABLET EVERY DAY 90 tablet 3   levothyroxine  (SYNTHROID ) 200 MCG tablet TAKE 1 TAB DAILY ON EMPTY STOMACH WITH ONLY WATER FOR 30 MINS. NO ANTACIDS, CALCIUM  OR MAGNESIUM  FOR 4 HRS. AVOID BIOTIN. 90 tablet 3   magnesium  oxide (MAG-OX) 400 MG tablet Take 400 mg by mouth daily.     MELATONIN PO Take 1 tablet by mouth daily as needed (For sleep).     memantine  (NAMENDA ) 10 MG tablet Start with one tab at bedtime. In 2 weeks if doing well can increase to 1 tab twice daily. 60 tablet 0   metoprolol  (TOPROL -XL) 200 MG 24 hr tablet Take 1 tablet (200 mg total) by mouth daily. 90 tablet 0   Multiple Vitamin (MULTIVITAMIN) tablet Take 1 tablet by mouth daily.     omeprazole  (PRILOSEC) 20 MG capsule TAKE 1 CAPSULE TWICE DAILY  ( EVERY 12 HOURS  ) TO PREVENT HEARTBURN AND ACID REFLUX 180 capsule 3   Probiotic Product (PROBIOTIC-10 PO) Take 1 tablet by mouth daily.     ramelteon  (ROZEREM ) 8 MG tablet TAKE 1 TABLET AT BEDTIME 90 tablet 3   rosuvastatin  (CRESTOR ) 40 MG tablet TAKE 1 TABLET BY MOUTH DAILY. 90 tablet 3   tamsulosin  (  FLOMAX ) 0.4 MG CAPS capsule TAKE 1 CAPSULE AT BEDTIME FOR PROSTATE AND BLADDER 90 capsule 3   torsemide  (DEMADEX ) 20 MG tablet TAKE 1 TABLET EVERY MORNING FOR SWELLING IN LEGS 90 tablet 3   UNABLE TO FIND Med Name: Tumeric     Docosahexaenoic Acid (DHA OMEGA 3 PO) Take 1 tablet by mouth daily. (Patient not taking: Reported on 12/18/2023)     No current facility-administered medications for this visit.     Past Medical History:  Diagnosis Date   A-fib (HCC) 01/2014   AICD (automatic cardioverter/defibrillator) present    [employer]   Anal fissure 11/10/2019   Arthritis    ASHD (arteriosclerotic heart disease) 2015   s/p CABG   Cataract    s/p left   CHF (congestive heart failure) (HCC)    COPD (chronic obstructive  pulmonary disease) (HCC)    by CXR, pt unsure of this   Diverticulosis    GERD (gastroesophageal reflux disease)    Gout 2014   Hyperlipidemia    Hypertension    Hypothyroidism    LBBB (left bundle branch block)    Polycythemia vera (HCC)    Prediabetes 01/2014   Presence of permanent cardiac pacemaker    Sleep apnea    no CPAP    Past Surgical History:  Procedure Laterality Date   CARDIAC DEFIBRILLATOR PLACEMENT  07/2014   CATARACT EXTRACTION W/ INTRAOCULAR LENS IMPLANT Left    COLONOSCOPY  08/26/2022   normal   CORONARY ARTERY BYPASS GRAFT  11/2013   ESOPHAGOGASTRODUODENOSCOPY  08/26/2022   esophageal dilation   lumb laminectomy  [phone]   x 3   LUMBAR FUSION  2013   NASAL SEPTOPLASTY W/ TURBINOPLASTY Bilateral 01/30/2016   Procedure: NASAL SEPTOPLASTY WITH BILATERAL TURBINATE REDUCTION;  Surgeon: Lonni FORBES Angle, MD;  Location: [facility] OR;  Service: ENT;  Laterality: Bilateral;   TONSILLECTOMY     UVULECTOMY N/A 01/30/2016   Procedure: OPHELIA;  Surgeon: Lonni FORBES Angle, MD;  Location: [facility] OR;  Service: ENT;  Laterality: N/A;    Social History   Socioeconomic History   Marital status: Married    Spouse name: [name]    Number of children: 2   Years of education: 13   Highest education level: Not on file  Occupational History   Occupation: Retired  Tobacco Use   Smoking status: Former    Current packs/day: 0.00    Average packs/day: 2.0 packs/day for 25.0 years (50.0 ttl pk-yrs)    Types: Cigarettes    Start date: 05/19/1958    Quit date: 05/20/1983    Years since quitting: 40.6   Smokeless tobacco: Never  Vaping Use   Vaping status: Never Used  Substance and Sexual Activity   Alcohol use: Never   Drug use: Never   Sexual activity: Not Currently  Other Topics Concern   Not on file  Social History Narrative   Lives with wife, [name]   Caffeine use: daily (Coffee)   Social Drivers of Health   Financial Resource Strain: Unknown (07/28/2019)    Received from [facility] St[facility]    5. Do you ever have problems being able to afford everything you need?: Not on file    4. Over the last 6 months, have you had trouble paying your heating or electricity bill?: Not on file  Food Insecurity: No Food Insecurity (06/05/2021)   Received from [facility]   Hunger Vital Sign    Within the past  12 months, you worried that your food would run out before you got the money to buy more.: Never true    Within the past 12 months, the food you bought just didn't last and you didn't have money to get more.: Never true  Transportation Needs: No Transportation Needs (03/29/2021)   PRAPARE - Administrator, Civil Service (Medical): No    Lack of Transportation (Non-Medical): No  Physical Activity: Not on file  Stress: Not on file  Social Connections: Unknown (09/18/2021)   Received from [facility]   Social Network    Social Network: Not on file  Intimate Partner Violence: Unknown (08/20/2021)   Received from [facility]   HITS    Physically Hurt: Not on file    Insult or Talk Down To: Not on file    Threaten Physical Harm: Not on file    Scream or Curse: Not on file    Family History  Problem Relation Age of Onset   Alzheimer's disease Mother    Mental illness Mother    Depression Mother    Heart disease Father 57       had 5 MI's & died 68 yo   Hypertension Father    Alcohol abuse Son    Heart attack Son    Stroke Maternal Grandmother    Dementia Maternal Grandmother    Cancer - Prostate Other    Colon cancer Neg Hx    Stomach cancer Neg Hx    Esophageal cancer Neg Hx     ROS: no fevers or chills, productive cough, hemoptysis, dysphasia, odynophagia, melena, hematochezia, dysuria, hematuria, rash, seizure activity, orthopnea, PND, claudication. Remaining systems are negative.  Physical Exam: Well-developed well-nourished in no acute distress.  Skin is warm and dry.  HEENT is normal.  Neck is  supple.  Chest is clear to auscultation with normal expansion.  Cardiovascular exam is regular, 3/6 systolic murmur left sternal border. Abdominal exam nontender or distended. No masses palpated. Extremities show trace edema. neuro grossly intact   A/P  1 aortic stenosis-moderate to severe on most recent echocardiogram.  Long discussion with patient and wife today.  He has some dyspnea on exertion but this appears to be chronic and unchanged.  However he is not as active.  He denies chest pain or syncope.  We discussed the possibility of proceeding with workup for TAVR.  However he is scheduled to have his generator changed for his device at the end of August.  He has some dementia and this worsens in the hospital.  For now we have elected to see him back in several months to make sure that his symptoms are stable.  He understands he will likely require TAVR in the near future.  2 permanent atrial fibrillation-continue Toprol  and apixaban  at present dose.  3 coronary artery disease status post coronary bypass graft-patient denies chest pain.  Continue statin.  4 hypertension-patient's blood pressure is controlled.  Continue present medications.  5 hyperlipidemia-continue Zetia  and Crestor .  6 chronic diastolic congestive heart failure-patient is euvolemic on examination.  Continue Demadex  and Jardiance  at present dose.  7 biventricular ICD-he is scheduled for generator change in August.  Redell Shallow, MD

## 2023-12-18 ENCOUNTER — Ambulatory Visit: Attending: Cardiology | Admitting: Cardiology

## 2023-12-18 ENCOUNTER — Encounter: Payer: Self-pay | Admitting: Cardiology

## 2023-12-18 VITALS — BP 100/64 | HR 59 | Ht 67.0 in | Wt 242.2 lb

## 2023-12-18 DIAGNOSIS — I350 Nonrheumatic aortic (valve) stenosis: Secondary | ICD-10-CM | POA: Diagnosis not present

## 2023-12-18 DIAGNOSIS — I2510 Atherosclerotic heart disease of native coronary artery without angina pectoris: Secondary | ICD-10-CM | POA: Insufficient documentation

## 2023-12-18 DIAGNOSIS — E782 Mixed hyperlipidemia: Secondary | ICD-10-CM | POA: Insufficient documentation

## 2023-12-18 DIAGNOSIS — I10 Essential (primary) hypertension: Secondary | ICD-10-CM | POA: Diagnosis not present

## 2023-12-18 DIAGNOSIS — I4821 Permanent atrial fibrillation: Secondary | ICD-10-CM | POA: Insufficient documentation

## 2023-12-18 NOTE — Patient Instructions (Signed)
  Follow-Up: At [facility], you and your health needs are our priority.  As part of our continuing mission to provide you with exceptional heart care, our providers are all part of one team.  This team includes your primary Cardiologist (physician) and Advanced Practice Providers or APPs (Physician Assistants and Nurse Practitioners) who all work together to provide you with the care you need, when you need it.  Your next appointment:   3 month(s)  Provider:   Alexandria Angel, MD

## 2023-12-22 ENCOUNTER — Encounter: Payer: Self-pay | Admitting: Cardiology

## 2023-12-22 DIAGNOSIS — I5022 Chronic systolic (congestive) heart failure: Secondary | ICD-10-CM | POA: Diagnosis not present

## 2023-12-22 NOTE — Telephone Encounter (Signed)
 Error

## 2023-12-23 LAB — CBC
Hematocrit: 46 % (ref 37.5–51.0)
Hemoglobin: 15.7 g/dL (ref 13.0–17.7)
MCH: 37.8 pg — ABNORMAL HIGH (ref 26.6–33.0)
MCHC: 34.1 g/dL (ref 31.5–35.7)
MCV: 111 fL — ABNORMAL HIGH (ref 79–97)
Platelets: 448 x10E3/uL (ref 150–450)
RBC: 4.15 x10E6/uL (ref 4.14–5.80)
RDW: 13.2 % (ref 11.6–15.4)
WBC: 16.8 x10E3/uL — ABNORMAL HIGH (ref 3.4–10.8)

## 2023-12-23 LAB — BASIC METABOLIC PANEL WITH GFR
BUN/Creatinine Ratio: 14 (ref 10–24)
BUN: 17 mg/dL (ref 8–27)
CO2: 26 mmol/L (ref 20–29)
Calcium: 9.9 mg/dL (ref 8.6–10.2)
Chloride: 96 mmol/L (ref 96–106)
Creatinine, Ser: 1.19 mg/dL (ref 0.76–1.27)
Glucose: 142 mg/dL — ABNORMAL HIGH (ref 70–99)
Potassium: 4.8 mmol/L (ref 3.5–5.2)
Sodium: 141 mmol/L (ref 134–144)
eGFR: 61 mL/min/1.73 (ref >59)

## 2023-12-24 ENCOUNTER — Encounter

## 2023-12-28 ENCOUNTER — Other Ambulatory Visit: Payer: Self-pay | Admitting: Neurology

## 2023-12-28 ENCOUNTER — Ambulatory Visit

## 2023-12-30 ENCOUNTER — Ambulatory Visit: Payer: Medicare Other | Admitting: Adult Health

## 2023-12-31 LAB — CUP PACEART REMOTE DEVICE CHECK
Brady Statistic RA Percent Paced: 0 %
Brady Statistic RV Percent Paced: 95 %
Date Time Interrogation Session: 20250814014100
HighPow Impedance: 67 Ohm
Implantable Lead Connection Status: 753985
Implantable Lead Connection Status: 753985
Implantable Lead Connection Status: 753985
Implantable Lead Implant Date: 20160311
Implantable Lead Implant Date: 20160311
Implantable Lead Implant Date: 20160311
Implantable Lead Location: 753858
Implantable Lead Location: 753859
Implantable Lead Location: 753860
Implantable Lead Model: 293
Implantable Lead Model: 4674
Implantable Lead Model: 5076
Implantable Lead Serial Number: 343289
Implantable Lead Serial Number: 508939
Implantable Pulse Generator Implant Date: 20160311
Lead Channel Impedance Value: 406 Ohm
Lead Channel Impedance Value: 644 Ohm
Lead Channel Impedance Value: 644 Ohm
Lead Channel Pacing Threshold Amplitude: 0.6 V
Lead Channel Pacing Threshold Amplitude: 0.7 V
Lead Channel Pacing Threshold Amplitude: 1 V
Lead Channel Pacing Threshold Pulse Width: 0.4 ms
Lead Channel Pacing Threshold Pulse Width: 0.4 ms
Lead Channel Pacing Threshold Pulse Width: 0.6 ms
Lead Channel Setting Pacing Amplitude: 1.6 V
Lead Channel Setting Pacing Amplitude: 2.2 V
Lead Channel Setting Pacing Pulse Width: 0.4 ms
Lead Channel Setting Pacing Pulse Width: 0.6 ms
Lead Channel Setting Sensing Sensitivity: 0.4 mV
Lead Channel Setting Sensing Sensitivity: 1 mV
Pulse Gen Serial Number: 130640

## 2024-01-02 ENCOUNTER — Ambulatory Visit: Payer: Self-pay | Admitting: Cardiology

## 2024-01-04 ENCOUNTER — Ambulatory Visit (INDEPENDENT_AMBULATORY_CARE_PROVIDER_SITE_OTHER): Admitting: Adult Health

## 2024-01-04 ENCOUNTER — Encounter: Payer: Self-pay | Admitting: Adult Health

## 2024-01-04 VITALS — BP 132/76 | HR 60 | Ht 67.0 in | Wt 239.0 lb

## 2024-01-04 DIAGNOSIS — F02B0 Dementia in other diseases classified elsewhere, moderate, without behavioral disturbance, psychotic disturbance, mood disturbance, and anxiety: Secondary | ICD-10-CM

## 2024-01-04 DIAGNOSIS — Z8673 Personal history of transient ischemic attack (TIA), and cerebral infarction without residual deficits: Secondary | ICD-10-CM

## 2024-01-04 DIAGNOSIS — G301 Alzheimer's disease with late onset: Secondary | ICD-10-CM

## 2024-01-04 MED ORDER — MEMANTINE HCL 10 MG PO TABS: 10.0000 mg | ORAL_TABLET | Freq: Two times a day (BID) | ORAL | 3 refills | Status: DC

## 2024-01-04 MED ORDER — DONEPEZIL HCL 10 MG PO TABS: 10.0000 mg | ORAL_TABLET | Freq: Every day | ORAL | 3 refills | Status: DC

## 2024-01-04 NOTE — Progress Notes (Signed)
 PATIENT: [name] [name] DOB: 04/02/1941  REASON FOR VISIT: follow up HISTORY FROM: patient PRIMARY NEUROLOGIST: Dr. Ines   Chief Complaint  Patient presents with   RM 4    Patient is here with his wife for follow-up. Wife states he is doing pretty good. Memory comes and goes. Specific questions can bother him. He has started hallucinating off and on. He will say that someone is sitting in the room with them. They are not unpleasant hallucinations.    MMSE 16/30 animals 2     HISTORY OF PRESENT ILLNESS: Today 01/04/24   [name] [name] is a 83 y.o. male who has been followed in this office for Alzheimer's disease and stroke.  Returns today for follow-up.  He is here today with his wife.  He feels that his memory has remained relatively stable.  He lives at home with his wife.  Able to complete all ADLs independently.  His wife manages his medications appointments and the finances.  He typically does not operate a motor vehicle.  His wife states that he will only driving a 5 mile radius from their home.  No significant change with his mood or behavior.  She has noted that he may have hallucinations if he is overtired.  She reports that he is now sleeping in the recliner and he is sleeping better.  She reports that primary care placed him on Ramelteon .  Patient denies any additional strokelike symptoms.  Wife notices that he had the most trouble with his vocabulary after the stroke.  PCP is managing risk factors.  He remains on Eliquis .  Returns today for an evaluation.  HISTORY (copied from Dr. Sharion note)   HISTORY 12/11/2022: THE Scan of the brain did show amyloid which is consistent with azheimer's pathology   2. The EEG was slowed which is consistent with memory dysfunction. No seizures however or other pathology noted.   3. The blood work did not show an alzheimer's gene however you can still develop alzheimer's without the gene   4. The blood work did show biolmarkers  consistent with alzheimer's dementia   5. We do have medications such as donepezil  or memantine  that can help slow down memory loss. Recently we have Lecanemab but patient would not quaify for this   We discussed the above in detail. Wife provides most information. Discuss alzheimers is a progressive disease,   Patient complains of symptoms per HPI as well as the following symptoms: memory loss, progresive . Pertinent negatives and positives per HPI. All others negative     HPI:  [name] [name] is a 83 y.o. male here as requested by Colette Torrence GRADE, MD for stroke. has HTN (hypertension); Hyperlipidemia; Vitamin D  deficiency; GERD ; BPH (benign prostatic hyperplasia); Medication management; Atrial fibrillation (HCC); Hypothyroidism; OSA and COPD overlap syndrome (HCC); Morbid obesity (HCC); ASHD/CABG x 3V (11/2013); Acquired thrombophilia (HCC); Cardiac pacemaker/AICD (01/2014); Emphysema of lung (HCC); Tortuous aorta (HCC)- CXR 2018; Senile purpura (HCC); Thrombocytosis; Polycythemia vera (HCC); Memory changes; Abnormal glucose; Rectal pain; Constipation; MPN (myeloproliferative neoplasm) (HCC); JAK2 V617F mutation; Complex sleep apnea syndrome; Intolerance of continuous positive airway pressure (CPAP) ventilation; Dysphagia; Loss of appetite; Loss of weight; Generalized abdominal pain; Thrombosed external hemorrhoid; Zenker's diverticulum; Atherosclerotic heart disease of native coronary artery without angina pectoris; Trigger ring finger of left hand; S/P CABG x 3; Regular astigmatism, right eye; 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); Ischemic cardiomyopathy; Gout; Combined forms of age-related cataract of right eye;  Chronic systolic (congestive) heart failure (HCC); Bilateral carpal tunnel syndrome; Chronic ulcer of left foot with fat layer exposed (HCC); Encounter for screening colonoscopy; Acute CVA (cerebrovascular accident) (HCC); Chronic diastolic CHF (congestive heart failure)  (HCC); Moderate late onset Alzheimer's dementia (HCC); Hypotension; and 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) on their problem list.   Wife provides most information.  She states that patient has been diagnosed with dementia and recently had a stroke.  She has a long list of questions.  She was referred for stroke follow-up but is adamant she wants to discuss both stroke and memory evaluation.  I explained it is very difficult to do in 1 appointment so this was an extended visit.  When results come back I will have to schedule her for an extended visit for at least an hour at the end of the day.  He is an 83 year old with a very complicated past medical history as above.  Significant for stroke includes multiple vascular risk factors including coronary artery disease status post CABG x 3, ischemic cardiomyopathy, pulmonary hypertension, congestive heart failure, left bundle branch block, prediabetes, hyperlipidemia, hypertension, mixed central and obstructive sleep apnea which is severe intolerant and needs BiPAP, COPD, afib, polycythemia vera area, morbid obesity, cardiac pacemaker, myeloproliferative neoplasm, JAK2 mutation, atherosclerosis heart disease, recent stroke.  Patient recently stopped warfarin for 5 days for colonoscopy in a few days after that wife says that he developed confusion, she feels very bad she did not take him to the emergency room right away, he had trouble figuring had a set up for coffee and usual things that he does, also quite fatigued, she finally took him to the ED which noted to have a stroke.  He was also diagnosed with dementia as well recently and has not been worked up.  She has a tremendous amount of questions.about all of it.  He was not given tPA because of completed stroke on head CT, out of the window and too mild to treat.   Reviewed notes, labs and imaging from outside physicians, which showed :   The patient split-night titration from March 08, 2021 had shown a AHI apnea-hypopnea index of high severity overall 42.8/h in supine sleep the AHI exacerbated to 65 so sleeping on his back is truly making apnea worse. The lowest oxygen  saturation was 84% and the patient had 52 minutes of low oxygen  at his baseline.    I reviewed all notes from the emergency room, reviewed images, patient had a right frontotemporal infarct embolic secondary to A-fib on Coumadin  and was subtherapeutic INR.  His initial CT already showed frontal infarct so he cannot get tPA.  He had a right M2 subocclusive embolus, in addition left P1 severe stenosis, right ICA 50% stenosis, left ICA 60% stenosis.  Patient cannot get MRIs due to incompatible ICD leads.  Repeat CTs inpatient continued evolution of the infarct.  2D echo showed EF 50 to 55% and the left atrium size was severely dilated.  His LDL was 100, hemoglobin A1c was 5.6.  He was changed to Eliquis  while in the hospital.  Notes did state to continue home aspirin  81 mg.  REVIEW OF SYSTEMS: Out of a complete 14 system review of symptoms, the patient complains only of the following symptoms, and all other reviewed systems are negative.   Listed in HPI  ALLERGIES: Allergies  Allergen Reactions   Other Swelling    SODIUM SENSITIVE    HOME MEDICATIONS: Outpatient Medications Prior to  Visit  Medication Sig Dispense Refill   acetaminophen  (TYLENOL ) 500 MG tablet Take 500 mg by mouth as needed for mild pain.     allopurinol  (ZYLOPRIM ) 300 MG tablet TAKE 1 TABLET EVERY DAY TO PREVENT GOUT 90 tablet 3   aspirin  EC 81 MG tablet Take 81 mg by mouth daily.     Cholecalciferol (D3 PO) Take 5,000 Units by mouth daily at 6 (six) AM.     Choline Dihydrogen Citrate (CHOLINE CITRATE PO) Take by mouth.     Cyanocobalamin  (B-12) 1000 MCG CAPS Take 1 capsule by mouth daily.     Docosahexaenoic Acid (DHA OMEGA 3 PO) Take 1 tablet by mouth daily.     docusate sodium  (COLACE) 100 MG capsule Take 100 mg by mouth 2 (two) times  daily. 1 tablet in the morning and 1 tablet at bedtime (stool softener)     ELIQUIS  5 MG TABS tablet TAKE 1 TABLET TWICE DAILY 180 tablet 3   ezetimibe  (ZETIA ) 10 MG tablet TAKE 1 TABLET EVERY DAY 90 tablet 3   finasteride  (PROSCAR ) 5 MG tablet TAKE 1 TABLET EVERY DAY 90 tablet 3   hydroxyurea  (HYDREA ) 500 MG capsule TAKE 2 CAPSULES ONE TIME DAILY WITH FOOD 180 capsule 3   isosorbide  mononitrate (IMDUR ) 30 MG 24 hr tablet TAKE 1 TABLET EVERY DAY 90 tablet 3   levothyroxine  (SYNTHROID ) 200 MCG tablet TAKE 1 TAB DAILY ON EMPTY STOMACH WITH ONLY WATER FOR 30 MINS. NO ANTACIDS, CALCIUM  OR MAGNESIUM  FOR 4 HRS. AVOID BIOTIN. 90 tablet 3   magnesium  oxide (MAG-OX) 400 MG tablet Take 400 mg by mouth daily.     MELATONIN PO Take 1 tablet by mouth daily as needed (For sleep).     metoprolol  (TOPROL -XL) 200 MG 24 hr tablet Take 1 tablet (200 mg total) by mouth daily. 90 tablet 0   Multiple Vitamin (MULTIVITAMIN) tablet Take 1 tablet by mouth daily.     omeprazole  (PRILOSEC) 20 MG capsule TAKE 1 CAPSULE TWICE DAILY  ( EVERY 12 HOURS  ) TO PREVENT HEARTBURN AND ACID REFLUX 180 capsule 3   Probiotic Product (PROBIOTIC-10 PO) Take 1 tablet by mouth daily.     ramelteon  (ROZEREM ) 8 MG tablet TAKE 1 TABLET AT BEDTIME 90 tablet 3   rosuvastatin  (CRESTOR ) 40 MG tablet TAKE 1 TABLET BY MOUTH DAILY. 90 tablet 3   tamsulosin  (FLOMAX ) 0.4 MG CAPS capsule TAKE 1 CAPSULE AT BEDTIME FOR PROSTATE AND BLADDER 90 capsule 3   torsemide  (DEMADEX ) 20 MG tablet TAKE 1 TABLET EVERY MORNING FOR SWELLING IN LEGS 90 tablet 3   UNABLE TO FIND Med Name: Tumeric     donepezil  (ARICEPT ) 10 MG tablet Take 1 tablet (10 mg total) by mouth at bedtime. 90 tablet 1   memantine  (NAMENDA ) 10 MG tablet Start with one tab at bedtime. In 2 weeks if doing well can increase to 1 tab twice daily. 60 tablet 0   No facility-administered medications prior to visit.    PAST MEDICAL HISTORY: Past Medical History:  Diagnosis Date   A-fib (HCC)  01/2014   AICD (automatic cardioverter/defibrillator) present    [employer]   Anal fissure 11/10/2019   Arthritis    ASHD (arteriosclerotic heart disease) 2015   s/p CABG   Cataract    s/p left   CHF (congestive heart failure) (HCC)    COPD (chronic obstructive pulmonary disease) (HCC)    by CXR, pt unsure of this   Diverticulosis    GERD (  gastroesophageal reflux disease)    Gout 2014   Hyperlipidemia    Hypertension    Hypothyroidism    LBBB (left bundle branch block)    Polycythemia vera (HCC)    Prediabetes 01/2014   Presence of permanent cardiac pacemaker    Sleep apnea    no CPAP    PAST SURGICAL HISTORY: Past Surgical History:  Procedure Laterality Date   CARDIAC DEFIBRILLATOR PLACEMENT  07/2014   CATARACT EXTRACTION W/ INTRAOCULAR LENS IMPLANT Left    COLONOSCOPY  08/26/2022   normal   CORONARY ARTERY BYPASS GRAFT  11/2013   ESOPHAGOGASTRODUODENOSCOPY  08/26/2022   esophageal dilation   lumb laminectomy  [phone]   x 3   LUMBAR FUSION  2013   NASAL SEPTOPLASTY W/ TURBINOPLASTY Bilateral 01/30/2016   Procedure: NASAL SEPTOPLASTY WITH BILATERAL TURBINATE REDUCTION;  Surgeon: Lonni FORBES Angle, MD;  Location: [facility] OR;  Service: ENT;  Laterality: Bilateral;   TONSILLECTOMY     UVULECTOMY N/A 01/30/2016   Procedure: OPHELIA;  Surgeon: Lonni FORBES Angle, MD;  Location: [facility] OR;  Service: ENT;  Laterality: N/A;    FAMILY HISTORY: Family History  Problem Relation Age of Onset   Alzheimer's disease Mother    Mental illness Mother    Depression Mother    Heart disease Father 11       had 5 MI's & died 55 yo   Hypertension Father    Alcohol abuse Son    Heart attack Son    Stroke Maternal Grandmother    Dementia Maternal Grandmother    Cancer - Prostate Other    Colon cancer Neg Hx    Stomach cancer Neg Hx    Esophageal cancer Neg Hx     SOCIAL HISTORY: Social History   Socioeconomic History   Marital status: Married    Spouse name:  [name]    Number of children: 2   Years of education: 13   Highest education level: Not on file  Occupational History   Occupation: Retired  Tobacco Use   Smoking status: Former    Current packs/day: 0.00    Average packs/day: 2.0 packs/day for 25.0 years (50.0 ttl pk-yrs)    Types: Cigarettes    Start date: 05/19/1958    Quit date: 05/20/1983    Years since quitting: 40.6   Smokeless tobacco: Never  Vaping Use   Vaping status: Never Used  Substance and Sexual Activity   Alcohol use: Never   Drug use: Never   Sexual activity: Not Currently  Other Topics Concern   Not on file  Social History Narrative   Lives with wife, [name]   Caffeine use: daily (Coffee) decaf   Social Drivers of Health   Financial Resource Strain: Unknown (07/28/2019)   Received from [facility] Strain    5. Do you ever have problems being able to afford everything you need?: Not on file    4. Over the last 6 months, have you had trouble paying your heating or electricity bill?: Not on file  Food Insecurity: No Food Insecurity (06/05/2021)   Received from [facility]   Hunger Vital Sign    Within the past 12 months, you worried that your food would run out before you got the money to buy more.: Never true    Within the past 12 months, the food you bought just didn't last and you didn't have money to get more.: Never true  Transportation Needs: No Transportation Needs (03/29/2021)  PRAPARE - Administrator, Civil Service (Medical): No    Lack of Transportation (Non-Medical): No  Physical Activity: Not on file  Stress: Not on file  Social Connections: Unknown (09/18/2021)   Received from [facility]   Social Network    Social Network: Not on file  Intimate Partner Violence: Unknown (08/20/2021)   Received from [facility]   HITS    Physically Hurt: Not on file    Insult or Talk Down To: Not on file    Threaten Physical Harm: Not on file    Scream or Curse: Not on  file      PHYSICAL EXAM  Vitals:   01/04/24 1445  BP: 132/76  Pulse: 60  Weight: 239 lb (108.4 kg)  Height: 5' 7 (1.702 m)   Body mass index is 37.43 kg/m.    01/04/2024    3:22 PM 10/22/2022    3:08 PM  MMSE - Mini Mental State Exam  Orientation to time 1 1  Orientation to Place 2 3  Registration 3 3  Attention/ Calculation 1 1  Recall 0 0  Language- name 2 objects 2 2  Language- repeat 1 1  Language- follow 3 step command 3 3  Language- read & follow direction 1 1  Write a sentence 1 0  Copy design 1 1  Total score 16 16    Generalized: Well developed, in no acute distress   Neurological examination  Mentation: Alert oriented to time, place, history taking. Follows all commands speech and language fluent Cranial nerve II-XII: Pupils were equal round reactive to light. Extraocular movements were full, visual field were full on confrontational test. Facial sensation and strength were normal. Uvula tongue midline. Head turning and shoulder shrug  were normal and symmetric. Motor: The motor testing reveals 5 over 5 strength of all 4 extremities. Good symmetric motor tone is noted throughout.  Sensory: Sensory testing is intact to soft touch on all 4 extremities. No evidence of extinction is noted.  Coordination: Cerebellar testing reveals good finger-nose-finger and heel-to-shin bilaterally.    DIAGNOSTIC DATA (LABS, IMAGING, TESTING) - I reviewed patient records, labs, notes, testing and imaging myself where available.  Lab Results  Component Value Date   WBC 16.8 (H) 12/22/2023   HGB 15.7 12/22/2023   HCT 46.0 12/22/2023   MCV 111 (H) 12/22/2023   PLT 448 12/22/2023      Component Value Date/Time   NA 141 12/22/2023 1428   K 4.8 12/22/2023 1428   CL 96 12/22/2023 1428   CO2 26 12/22/2023 1428   GLUCOSE 142 (H) 12/22/2023 1428   GLUCOSE 132 (H) 11/17/2023 1339   BUN 17 12/22/2023 1428   CREATININE 1.19 12/22/2023 1428   CREATININE 1.03 11/17/2023 1339    CREATININE 1.30 (H) 10/16/2021 1225   CALCIUM  9.9 12/22/2023 1428   PROT 7.0 11/17/2023 1339   PROT 6.7 09/23/2022 1516   ALBUMIN 4.3 11/17/2023 1339   ALBUMIN 4.3 09/23/2022 1516   AST 22 11/17/2023 1339   ALT 14 11/17/2023 1339   ALKPHOS 58 11/17/2023 1339   BILITOT 1.6 (H) 11/17/2023 1339   GFRNONAA >60 11/17/2023 1339   GFRNONAA 66 09/27/2020 1438   GFRAA 76 09/27/2020 1438   Lab Results  Component Value Date   CHOL 141 06/11/2023   HDL 23 (L) 06/11/2023   LDLCALC 90 06/11/2023   TRIG 157 (H) 06/11/2023   CHOLHDL 6.1 (H) 06/11/2023   Lab Results  Component Value Date  HGBA1C 5.6 09/16/2022   Lab Results  Component Value Date   VITAMINB12 >2000 (H) 10/22/2022   Lab Results  Component Value Date   TSH 3.020 06/11/2023      ASSESSMENT AND PLAN 83 y.o. year old male  has a past medical history of A-fib (HCC) (01/2014), AICD (automatic cardioverter/defibrillator) present, Anal fissure (11/10/2019), Arthritis, ASHD (arteriosclerotic heart disease) (2015), Cataract, CHF (congestive heart failure) (HCC), COPD (chronic obstructive pulmonary disease) (HCC), Diverticulosis, GERD (gastroesophageal reflux disease), Gout (2014), Hyperlipidemia, Hypertension, Hypothyroidism, LBBB (left bundle branch block), Polycythemia vera (HCC), Prediabetes (01/2014), Presence of permanent cardiac pacemaker, and Sleep apnea. here with:  Alzheimer's disease History of stroke  MMSE is stable 16 out of 30 previously 16 out of 30 Continue Aricept  10 mg at bedtime Continue Namenda  10 mg twice a day Follow-up in 1 year or sooner if needed  Meds ordered this encounter  Medications   donepezil  (ARICEPT ) 10 MG tablet    Sig: Take 1 tablet (10 mg total) by mouth at bedtime.    Dispense:  90 tablet    Refill:  3    Supervising Provider:   AHERN, ANTONIA B [[id number]]   memantine  (NAMENDA ) 10 MG tablet    Sig: Take 1 tablet (10 mg total) by mouth 2 (two) times daily.    Dispense:  180 tablet     Refill:  3    Start with local pharmacy while waiting on mail order.    Supervising Provider:   INES ONETHA NOVAK [[id number]]   No orders of the defined types were placed in this encounter.    Duwaine Russell, MSN, NP-C 01/04/2024, 3:39 PM [facility] [address] [place], [place] [address] [phone]  The patient's condition requires frequent monitoring and adjustments in the treatment plan, reflecting the ongoing complexity of care.  This provider is the continuing focal point for all needed services for this condition.

## 2024-01-08 ENCOUNTER — Telehealth (HOSPITAL_COMMUNITY): Payer: Self-pay

## 2024-01-08 NOTE — Telephone Encounter (Signed)
 Received a return call from patient's wife [name] to discuss upcoming procedure/ DPR on file.   Confirmed patient is scheduled for a ICD generator change on Thursday, August 28 with Dr. Soyla Norton. Instructed patient to arrive at the Main Entrance A at [facility]: [address] [place], [place] [address] and check in at Admitting at 1:00 PM.   Labs completed  Any recent signs of acute illness or been started on antibiotics? No Any new medications started? No Any medications to hold? Hold Eliquis  for 2 days prior to your procedure- your last dose will be August 25. Hold Torsemide  on the morning of your procedure. Medication instructions:  On the morning of your procedure you may take all other morning medications not discussed with a sip water You may have a LIGHT breakfast BEFORE 7:00 AM. Nothing to eat or drink after 7:00 AM the day of your procedure.   The night before your procedure and the morning of your procedure, wash thoroughly with the CHG surgical soap from the neck down, paying special attention to the area where your procedure will be performed.  Advised of plan to go home the same day and will only stay overnight if medically necessary. You MUST have a responsible adult to drive you home and MUST be with you the first 24 hours after you arrive home.  Mrs. Aloisi expressed her concerns about patient receiving anesthesia because of a noticeable change in mental status after colonoscopy in 2024. Informed Mrs. Aschoff that local anesthesia is used and maybe a medication to help relax him, but not propofol  as given during colonoscopy. She verbalized understanding to all instructions provided and agreed to proceed with procedure

## 2024-01-08 NOTE — Telephone Encounter (Signed)
 Attempted to reach patient to discuss upcoming procedure, no answer. Left VM for patient to return call.

## 2024-01-13 NOTE — Progress Notes (Signed)
 HPI: FU congestive heart failure. Patient was previously followed by Vinie Loop at [facility]. Patient has a history of coronary artery disease and had coronary artery bypass and graft in 2015. He also has a history of permanent atrial fibrillation, aortic stenosis, biventricular ICD and heart failure with improved ejection fraction. CT of the abdomen June 2023 showed no aneurysm. Patient had CVA May 2024.  Seen by neurology and Coumadin  changed to apixaban  and aspirin  81 mg daily added.  Echocardiogram repeated May 2025 which showed normal LV function, mild right ventricular enlargement, severe biatrial enlargement, mild mitral regurgitation, moderate tricuspid regurgitation, mild aortic insufficiency and moderate to severe aortic stenosis with mean gradient 36 mmHg and aortic valve area 0.94 cm.  Patient had ICD generator changed August 2025.  Since last seen, per his wife he has had some increased dyspnea on exertion though not clear patient feels this way.  He has had difficulties with his memory.  He is sleeping 16 hours a day per his wife.  He has developed a small decubitus ulcer.  He has not had chest pain or syncope.  Chronic mild pedal edema.  Current Outpatient Medications  Medication Sig Dispense Refill   acetaminophen  (TYLENOL ) 500 MG tablet Take 1,000 mg by mouth in the morning.     allopurinol  (ZYLOPRIM ) 300 MG tablet TAKE 1 TABLET EVERY DAY TO PREVENT GOUT 90 tablet 3   aspirin  EC 81 MG tablet Take 81 mg by mouth daily.     Cholecalciferol (VITAMIN D -3) 125 MCG (5000 UT) TABS Take 5,000 Units by mouth daily.     Choline Dihydrogen Citrate (CHOLINE CITRATE PO) Take 1 tablet by mouth at bedtime.     Cyanocobalamin  (B-12) 1000 MCG CAPS Take 5,000 mg by mouth daily. 2500  mg each     diphenhydrAMINE  (BENADRYL ) 25 mg capsule Take 25 mg by mouth at bedtime.     docusate sodium  (COLACE) 100 MG capsule Take 100 mg by mouth daily with lunch.     donepezil  (ARICEPT ) 10 MG tablet Take 1  tablet (10 mg total) by mouth at bedtime. 90 tablet 3   ELIQUIS  5 MG TABS tablet TAKE 1 TABLET TWICE DAILY 180 tablet 3   ezetimibe  (ZETIA ) 10 MG tablet TAKE 1 TABLET EVERY DAY 90 tablet 3   finasteride  (PROSCAR ) 5 MG tablet TAKE 1 TABLET EVERY DAY 90 tablet 3   Ginger, Zingiber officinalis, (GINGER ROOT) 550 MG CAPS Take 550 mg by mouth 2 (two) times daily before a meal.     hydroxyurea  (HYDREA ) 500 MG capsule TAKE 2 CAPSULES ONE TIME DAILY WITH FOOD 180 capsule 3   isosorbide  mononitrate (IMDUR ) 30 MG 24 hr tablet TAKE 1 TABLET EVERY DAY 90 tablet 3   levothyroxine  (SYNTHROID ) 200 MCG tablet TAKE 1 TAB DAILY ON EMPTY STOMACH WITH ONLY WATER FOR 30 MINS. NO ANTACIDS, CALCIUM  OR MAGNESIUM  FOR 4 HRS. AVOID BIOTIN. 90 tablet 3   magnesium  oxide (MAG-OX) 400 MG tablet Take 400 mg by mouth at bedtime.     Melatonin 10 MG TABS Take 10 mg by mouth at bedtime.     memantine  (NAMENDA ) 10 MG tablet Take 1 tablet (10 mg total) by mouth 2 (two) times daily. 180 tablet 3   metoprolol  (TOPROL -XL) 200 MG 24 hr tablet Take 1 tablet (200 mg total) by mouth daily. 90 tablet 0   Multiple Vitamin (MULTIVITAMIN) tablet Take 1 tablet by mouth daily.     omeprazole  (PRILOSEC) 20 MG capsule TAKE  1 CAPSULE TWICE DAILY  ( EVERY 12 HOURS  ) TO PREVENT HEARTBURN AND ACID REFLUX 180 capsule 3   POLYETHYLENE GLYCOL 3350  PO Take 1 Capful by mouth at bedtime.     Probiotic Product (PROBIOTIC-10 PO) Take 1 tablet by mouth daily.     ramelteon  (ROZEREM ) 8 MG tablet TAKE 1 TABLET AT BEDTIME 90 tablet 3   rosuvastatin  (CRESTOR ) 40 MG tablet TAKE 1 TABLET BY MOUTH DAILY. 90 tablet 3   tamsulosin  (FLOMAX ) 0.4 MG CAPS capsule TAKE 1 CAPSULE AT BEDTIME FOR PROSTATE AND BLADDER 90 capsule 3   torsemide  (DEMADEX ) 20 MG tablet TAKE 1 TABLET EVERY MORNING FOR SWELLING IN LEGS 90 tablet 3   Turmeric 500 MG TABS Take 500 mg by mouth daily.     No current facility-administered medications for this visit.     Past Medical History:   Diagnosis Date   A-fib (HCC) 01/2014   AICD (automatic cardioverter/defibrillator) present    [employer]   Anal fissure 11/10/2019   Arthritis    ASHD (arteriosclerotic heart disease) 2015   s/p CABG   Cataract    s/p left   CHF (congestive heart failure) (HCC)    COPD (chronic obstructive pulmonary disease) (HCC)    by CXR, pt unsure of this   Diverticulosis    GERD (gastroesophageal reflux disease)    Gout 2014   Hyperlipidemia    Hypertension    Hypothyroidism    LBBB (left bundle branch block)    Polycythemia vera (HCC)    Prediabetes 01/2014   Presence of permanent cardiac pacemaker    Sleep apnea    no CPAP    Past Surgical History:  Procedure Laterality Date   BIV ICD GENERATOR CHANGEOUT N/A 01/14/2024   Procedure: BIV ICD GENERATOR CHANGEOUT;  Surgeon: Inocencio Soyla Lunger, MD;  Location: [facility] INVASIVE CV LAB;  Service: Cardiovascular;  Laterality: N/A;   CARDIAC DEFIBRILLATOR PLACEMENT  07/2014   CATARACT EXTRACTION W/ INTRAOCULAR LENS IMPLANT Left    COLONOSCOPY  08/26/2022   normal   CORONARY ARTERY BYPASS GRAFT  11/2013   ESOPHAGOGASTRODUODENOSCOPY  08/26/2022   esophageal dilation   lumb laminectomy  [phone]   x 3   LUMBAR FUSION  2013   NASAL SEPTOPLASTY W/ TURBINOPLASTY Bilateral 01/30/2016   Procedure: NASAL SEPTOPLASTY WITH BILATERAL TURBINATE REDUCTION;  Surgeon: Lonni FORBES Angle, MD;  Location: [facility] OR;  Service: ENT;  Laterality: Bilateral;   TONSILLECTOMY     UVULECTOMY N/A 01/30/2016   Procedure: OPHELIA;  Surgeon: Lonni FORBES Angle, MD;  Location: [facility] OR;  Service: ENT;  Laterality: N/A;    Social History   Socioeconomic History   Marital status: Married    Spouse name: [name]    Number of children: 2   Years of education: 13   Highest education level: Not on file  Occupational History   Occupation: Retired  Tobacco Use   Smoking status: Former    Current packs/day: 0.00    Average packs/day: 2.0 packs/day for 25.0  years (50.0 ttl pk-yrs)    Types: Cigarettes    Start date: 05/19/1958    Quit date: 05/20/1983    Years since quitting: 40.7   Smokeless tobacco: Never  Vaping Use   Vaping status: Never Used  Substance and Sexual Activity   Alcohol use: Never   Drug use: Never   Sexual activity: Not Currently  Other Topics Concern   Not on file  Social History Narrative   Lives with  wife, [name]   Caffeine use: daily (Coffee) decaf   Social Drivers of Health   Financial Resource Strain: Unknown (07/28/2019)   Received from [facility] [employer] Strain    5. Do you ever have problems being able to afford everything you need?: Not on file    4. Over the last 6 months, have you had trouble paying your heating or electricity bill?: Not on file  Food Insecurity: No Food Insecurity (06/05/2021)   Received from [facility]   Hunger Vital Sign    Within the past 12 months, you worried that your food would run out before you got the money to buy more.: Never true    Within the past 12 months, the food you bought just didn't last and you didn't have money to get more.: Never true  Transportation Needs: No Transportation Needs (03/29/2021)   PRAPARE - Administrator, Civil Service (Medical): No    Lack of Transportation (Non-Medical): No  Physical Activity: Not on file  Stress: Not on file  Social Connections: Unknown (09/18/2021)   Received from [facility]   Social Network    Social Network: Not on file  Intimate Partner Violence: Unknown (08/20/2021)   Received from [facility]   HITS    Physically Hurt: Not on file    Insult or Talk Down To: Not on file    Threaten Physical Harm: Not on file    Scream or Curse: Not on file    Family History  Problem Relation Age of Onset   Alzheimer's disease Mother    Mental illness Mother    Depression Mother    Heart disease Father 70       had 5 MI's & died 61 yo   Hypertension Father    Alcohol abuse Son    Heart attack  Son    Stroke Maternal Grandmother    Dementia Maternal Grandmother    Cancer - Prostate Other    Colon cancer Neg Hx    Stomach cancer Neg Hx    Esophageal cancer Neg Hx     ROS: no fevers or chills, productive cough, hemoptysis, dysphasia, odynophagia, melena, hematochezia, dysuria, hematuria, rash, seizure activity, orthopnea, PND, claudication. Remaining systems are negative.  Physical Exam: Well-developed well-nourished in no acute distress.  Skin is warm and dry.  HEENT is normal.  Neck is supple.  Chest is clear to auscultation with normal expansion.  Cardiovascular exam is regular rate and rhythm.  3/6 systolic murmur left sternal border. Abdominal exam nontender or distended. No masses palpated. Extremities show trace edema. neuro grossly intact  A/P  1 aortic stenosis-moderate to severe aortic stenosis on most recent echocardiogram.  Difficult to assess symptoms.  He apparently has had declining memory, developed a decubitus ulcer and is sleeping 16 hours daily.  His wife feels as though he has had increased dyspnea on exertion though he denies.  He has not had chest pain or syncope.  For now we will plan observation.  He understands he may require TAVR in the future if he is a candidate.  However would need to have his decubitus ulcer healed prior to consideration.  2 permanent atrial fibrillation-will continue Toprol  for rate control.  Continue apixaban .  3 coronary artery disease status post coronary bypass and graft-patient is not having chest pain.  Continue statin.  4 biventricular ICD-status post recent generator change.  Management per electrophysiology.  5 hypertension-blood pressure controlled.  Continue present  medical regimen.  6 hyperlipidemia-continue Crestor  and Zetia .  7 chronic diastolic congestive heart failure-patient remains euvolemic on examination.  Continue Demadex .  Declined Jardiance  previously.  Redell Shallow, MD

## 2024-01-13 NOTE — Pre-Procedure Instructions (Signed)
 Instructed patient on the following items: Arrival time 1230 Nothing to eat or drink after midnight No meds AM of procedure Responsible person to drive you home and stay with you for 24 hrs Wash with special soap night before and morning of procedure If on anti-coagulant drug instructions Eliquis - last dose 8/25

## 2024-01-14 ENCOUNTER — Other Ambulatory Visit: Payer: Self-pay

## 2024-01-14 ENCOUNTER — Ambulatory Visit (HOSPITAL_COMMUNITY)
Admission: RE | Disposition: A | Discharge status: Home / Self Care | Payer: Self-pay | Source: Home / Self Care | Attending: Cardiology

## 2024-01-14 ENCOUNTER — Ambulatory Visit (HOSPITAL_COMMUNITY)
Admission: RE | Admit: 2024-01-14 | Discharge: 2024-01-14 | Disposition: A | Discharge status: Home / Self Care | Attending: Cardiology | Admitting: Cardiology

## 2024-01-14 DIAGNOSIS — I5022 Chronic systolic (congestive) heart failure: Secondary | ICD-10-CM | POA: Diagnosis not present

## 2024-01-14 DIAGNOSIS — Z950 Presence of cardiac pacemaker: Secondary | ICD-10-CM

## 2024-01-14 DIAGNOSIS — Z4502 Encounter for adjustment and management of automatic implantable cardiac defibrillator: Secondary | ICD-10-CM | POA: Diagnosis not present

## 2024-01-14 DIAGNOSIS — I110 Hypertensive heart disease with heart failure: Secondary | ICD-10-CM | POA: Diagnosis not present

## 2024-01-14 HISTORY — PX: BIV ICD GENERATOR CHANGEOUT: EP1194

## 2024-01-14 SURGERY — BIV ICD GENERATOR CHANGEOUT

## 2024-01-14 MED ORDER — SODIUM CHLORIDE 0.9 % IV SOLN: INTRAVENOUS | Status: DC

## 2024-01-14 MED ORDER — LIDOCAINE HCL (PF) 1 % IJ SOLN
INTRAMUSCULAR | Status: DC | PRN
  Administered 2024-01-14: 60 mL

## 2024-01-14 MED ORDER — LIDOCAINE HCL (PF) 1 % IJ SOLN
INTRAMUSCULAR | Status: AC
  Filled 2024-01-14: qty 30

## 2024-01-14 MED ORDER — CEFAZOLIN SODIUM-DEXTROSE 2-4 GM/100ML-% IV SOLN: 2.0000 g | INTRAVENOUS | Status: AC

## 2024-01-14 MED ORDER — SODIUM CHLORIDE 0.9 % IV SOLN
INTRAVENOUS | Status: AC
  Administered 2024-01-14: 80 mg
  Filled 2024-01-14: qty 2

## 2024-01-14 MED ORDER — ONDANSETRON HCL 4 MG/2ML IJ SOLN: 4.0000 mg | Freq: Four times a day (QID) | INTRAMUSCULAR | Status: DC | PRN

## 2024-01-14 MED ORDER — ACETAMINOPHEN 325 MG PO TABS: 325.0000 mg | ORAL_TABLET | ORAL | Status: DC | PRN

## 2024-01-14 MED ORDER — SODIUM CHLORIDE 0.9 % IV SOLN
80.0000 mg | INTRAVENOUS | Status: AC
  Filled 2024-01-14: qty 2

## 2024-01-14 MED ORDER — POVIDONE-IODINE 10 % EX SWAB: 2.0000 | Freq: Once | CUTANEOUS | Status: DC

## 2024-01-14 MED ORDER — CHLORHEXIDINE GLUCONATE 4 % EX SOLN
4.0000 | Freq: Once | CUTANEOUS | Status: DC
  Filled 2024-01-14: qty 60

## 2024-01-14 MED ORDER — CEFAZOLIN SODIUM-DEXTROSE 2-4 GM/100ML-% IV SOLN
INTRAVENOUS | Status: AC
  Administered 2024-01-14: 2 g via INTRAVENOUS
  Filled 2024-01-14: qty 100

## 2024-01-14 MED ORDER — SODIUM CHLORIDE 0.9 % IV SOLN
INTRAVENOUS | Status: AC
  Filled 2024-01-14: qty 2

## 2024-01-14 SURGICAL SUPPLY — 5 items
CABLE SURGICAL S-101-97-12 (CABLE) ×1 IMPLANT
ICD VIGILANT DF4 G247 (ICD Generator) IMPLANT
PAD DEFIB RADIO PHYSIO CONN (PAD) ×1 IMPLANT
POUCH AIGIS-R ANTIBACT ICD LRG (Mesh General) IMPLANT
TRAY PACEMAKER INSERTION (PACKS) ×1 IMPLANT

## 2024-01-14 NOTE — Discharge Instructions (Signed)

## 2024-01-14 NOTE — H&P (Signed)
  Electrophysiology Office Note:   Date:  01/14/2024  ID:  [name] [name], DOB 1941-01-29, MRN [id number]  Primary Cardiologist: Redell Shallow, MD Primary Heart Failure: None Electrophysiologist: [name] Sawaya [name], MD      History of Present Illness:   [name] [name] is a 83 y.o. male with h/o atrial fibrillation, chronic systolic heart failure, coronary artery disease, CABG, COPD, hypertension, hyperlipidemia, sleep apnea seen today for routine electrophysiology followup.   Today, denies symptoms of palpitations, chest pain, dyspnea, orthopnea, PND, lower extremity edema, claudication, dizziness, presyncope, syncope, bleeding, or neurologic sequela. The patient is tolerating medications without difficulties. Plan gen change today.     EP Information / Studies Reviewed:    EKG is not ordered today. EKG from 07/01/2023 reviewed which showed atrial fibrillation, ventricular paced      ICD Interrogation-  reviewed in detail today,  See PACEART report.  Device History: [employer] BiV ICD implanted  for chronic systolic heart failure History of appropriate therapy: No History of AAD therapy: No   Risk Assessment/Calculations:    CHA2DS2-VASc Score = 5   This indicates a 7.2% annual risk of stroke. The patient's score is based upon: CHF History: 1 HTN History: 1 Diabetes History: 0 Stroke History: 0 Vascular Disease History: 1 Age Score: 2 Gender Score: 0            Physical Exam:   VS:  There were no vitals taken for this visit.   Wt Readings from Last 3 Encounters:  01/04/24 108.4 kg  12/18/23 109.9 kg  11/17/23 108.8 kg    GEN: Well nourished, well developed in no acute distress NECK: No JVD; No carotid bruits CARDIAC: Regular rate and rhythm, no murmurs, rubs, gallops RESPIRATORY:  Clear to auscultation without rales, wheezing or rhonchi  ABDOMEN: Soft, non-tender, non-distended EXTREMITIES:  No edema; No deformity    ASSESSMENT AND PLAN:     Chronic systolic dysfunction s/p [employer] CRT-D  [employer] has presented today for surgery, with the diagnosis of ICD at [facility].  The various methods of treatment have been discussed with the patient and family. After consideration of risks, benefits and other options for treatment, the patient has consented to  Procedure(s): ICD gen change as a surgical intervention .  Risks include but not limited to bleeding, infection, pneumothorax, perforation, tamponade, vascular damage, renal failure, MI, stroke, death, and lead dislodgement . The patient's history has been reviewed, patient examined, no change in status, stable for surgery.  I have reviewed the patient's chart and labs.  Questions were answered to the patient's satisfaction.    [name] Baudoin Norton, MD 01/14/2024 12:28 PM

## 2024-01-15 ENCOUNTER — Encounter (HOSPITAL_COMMUNITY): Payer: Self-pay | Admitting: Cardiology

## 2024-01-15 MED FILL — Gentamicin Sulfate Inj 40 MG/ML: INTRAMUSCULAR | Qty: 80 | Status: AC

## 2024-01-20 ENCOUNTER — Other Ambulatory Visit: Payer: Self-pay

## 2024-01-27 ENCOUNTER — Encounter: Payer: Self-pay | Admitting: Cardiology

## 2024-01-27 ENCOUNTER — Ambulatory Visit (INDEPENDENT_AMBULATORY_CARE_PROVIDER_SITE_OTHER): Admitting: Cardiology

## 2024-01-27 ENCOUNTER — Ambulatory Visit: Admitting: Family Medicine

## 2024-01-27 ENCOUNTER — Telehealth: Payer: Self-pay

## 2024-01-27 VITALS — BP 128/66 | HR 62 | Ht 67.0 in | Wt 236.0 lb

## 2024-01-27 DIAGNOSIS — I4821 Permanent atrial fibrillation: Secondary | ICD-10-CM

## 2024-01-27 DIAGNOSIS — I2510 Atherosclerotic heart disease of native coronary artery without angina pectoris: Secondary | ICD-10-CM | POA: Diagnosis not present

## 2024-01-27 DIAGNOSIS — I10 Essential (primary) hypertension: Secondary | ICD-10-CM | POA: Diagnosis not present

## 2024-01-27 DIAGNOSIS — I350 Nonrheumatic aortic (valve) stenosis: Secondary | ICD-10-CM

## 2024-01-27 DIAGNOSIS — Z950 Presence of cardiac pacemaker: Secondary | ICD-10-CM

## 2024-01-27 DIAGNOSIS — E782 Mixed hyperlipidemia: Secondary | ICD-10-CM | POA: Diagnosis not present

## 2024-01-27 NOTE — Patient Instructions (Signed)
   Follow-Up: At [facility], you and your health needs are our priority.  As part of our continuing mission to provide you with exceptional heart care, our providers are all part of one team.  This team includes your primary Cardiologist (physician) and Advanced Practice Providers or APPs (Physician Assistants and Nurse Practitioners) who all work together to provide you with the care you need, when you need it.  Your next appointment:   4 month(s)  Provider:   Redell Shallow, MD

## 2024-01-27 NOTE — Telephone Encounter (Signed)
 Patient came in to state that they just came from the Dr. Pietro and stated that he needs a antibiotic for the decubitus. She stated that the doctor didn't prescribed an antibiotic. She made an appointment for 02/01/2024 at 11:10. She's not able to use MYChart. Please advise?

## 2024-01-27 NOTE — Telephone Encounter (Signed)
 Patient is aware.

## 2024-01-28 ENCOUNTER — Ambulatory Visit: Attending: Cardiology

## 2024-01-28 ENCOUNTER — Ambulatory Visit: Payer: Self-pay | Admitting: Cardiology

## 2024-01-28 ENCOUNTER — Encounter

## 2024-01-28 DIAGNOSIS — I5022 Chronic systolic (congestive) heart failure: Secondary | ICD-10-CM | POA: Insufficient documentation

## 2024-01-28 LAB — CUP PACEART INCLINIC DEVICE CHECK
Date Time Interrogation Session: 20250911141950
HighPow Impedance: 55 Ohm
Implantable Lead Connection Status: 753985
Implantable Lead Connection Status: 753985
Implantable Lead Connection Status: 753985
Implantable Lead Implant Date: 20160311
Implantable Lead Implant Date: 20160311
Implantable Lead Implant Date: 20160311
Implantable Lead Location: 753858
Implantable Lead Location: 753859
Implantable Lead Location: 753860
Implantable Lead Model: 293
Implantable Lead Model: 4674
Implantable Lead Model: 5076
Implantable Lead Serial Number: 343289
Implantable Lead Serial Number: 508939
Implantable Pulse Generator Implant Date: 20250828
Lead Channel Impedance Value: 415 Ohm
Lead Channel Impedance Value: 638 Ohm
Lead Channel Impedance Value: 649 Ohm
Lead Channel Pacing Threshold Amplitude: 1.1 V
Lead Channel Pacing Threshold Pulse Width: 0.4 ms
Lead Channel Sensing Intrinsic Amplitude: 25 mV
Lead Channel Sensing Intrinsic Amplitude: 25 mV
Lead Channel Sensing Intrinsic Amplitude: 3.3 mV
Lead Channel Setting Pacing Amplitude: 1.6 V
Lead Channel Setting Pacing Amplitude: 2.2 V
Lead Channel Setting Pacing Pulse Width: 0.4 ms
Lead Channel Setting Pacing Pulse Width: 0.6 ms
Lead Channel Setting Sensing Sensitivity: 0.4 mV
Lead Channel Setting Sensing Sensitivity: 1 mV
Pulse Gen Serial Number: 334229
Zone Setting Status: 755011

## 2024-01-28 NOTE — Patient Instructions (Addendum)
   After Your ICD (Implantable Cardiac Defibrillator)    Monitor your defibrillator site for redness, swelling, and drainage. Call the device clinic at [phone] if you experience these symptoms or fever/chills. Wash area of skin that is not completely healed with warm soapy water and dry completely. Apply triple antibiotic ointment once daily to wound but do not allow on incision area. Apply large band aid to wound.   Your incision was closed with Steri-strips or staples:  You may shower 7 days after your procedure and wash your incision with soap and water. Avoid lotions, ointments, or perfumes over your incision until it is well-healed.  You may use a hot tub or a pool after your wound check appointment if the incision is completely closed.  There are no other restrictions in arm movement after your wound check appointment.  Your ICD is designed to protect you from life threatening heart rhythms. Because of this, you may receive a shock.   1 shock with no symptoms:  Call the office during business hours. 1 shock with symptoms (chest pain, chest pressure, dizziness, lightheadedness, shortness of breath, overall feeling unwell):  Call 911. If you experience 2 or more shocks in 24 hours:  Call 911. If you receive a shock, you should not drive.  [place] DMV - no driving for 6 months if you receive appropriate therapy from your ICD.   ICD Alerts:  Some alerts are vibratory and others beep. These are NOT emergencies. Please call our office to let us  know. If this occurs at night or on weekends, it can wait until the next business day. Send a remote transmission.  If your device is capable of reading fluid status (for heart failure), you will be offered monthly monitoring to review this with you.   Remote monitoring is used to monitor your ICD from home. This monitoring is scheduled every 91 days by our office. It allows us  to keep an eye on the functioning of your device to ensure it is working  properly. You will routinely see your Electrophysiologist annually (more often if necessary).

## 2024-01-28 NOTE — Progress Notes (Signed)
 Normal multi chamber ICD wound check. Wound well healed. Presenting rhythm: AF/BP 60. Routine testing performed. Thresholds, sensing, and impedance consistent with implant measurements. No treated arrhythmias. Reviewed shock plan.  Pt enrolled in remote follow-up.  Wash area of skin below incision area with warm soapy water and dry completely. Apply triple antibiotic ointment once daily to wound but do not allow on incision area. Apply large band aid to wound.

## 2024-02-01 ENCOUNTER — Ambulatory Visit (INDEPENDENT_AMBULATORY_CARE_PROVIDER_SITE_OTHER): Admitting: Family Medicine

## 2024-02-01 ENCOUNTER — Ambulatory Visit: Attending: Internal Medicine

## 2024-02-01 ENCOUNTER — Encounter: Payer: Self-pay | Admitting: Family Medicine

## 2024-02-01 VITALS — BP 113/69 | HR 60 | Temp 97.8°F | Resp 18 | Ht 67.0 in | Wt 235.5 lb

## 2024-02-01 DIAGNOSIS — Z23 Encounter for immunization: Secondary | ICD-10-CM | POA: Diagnosis not present

## 2024-02-01 DIAGNOSIS — I639 Cerebral infarction, unspecified: Secondary | ICD-10-CM

## 2024-02-01 DIAGNOSIS — L89151 Pressure ulcer of sacral region, stage 1: Secondary | ICD-10-CM | POA: Diagnosis not present

## 2024-02-01 MED ORDER — DOXYCYCLINE HYCLATE 100 MG PO TABS: 100.0000 mg | ORAL_TABLET | Freq: Two times a day (BID) | ORAL | 0 refills | Status: AC

## 2024-02-01 MED ORDER — MUPIROCIN 2 % EX OINT: 1.0000 | TOPICAL_OINTMENT | Freq: Every day | CUTANEOUS | 0 refills | Status: DC

## 2024-02-01 NOTE — Progress Notes (Signed)
 Acute Office Visit  Subjective:     Patient ID: [name] [name], male    DOB: July 02, 1940, 83 y.o.   MRN: [id number]  Chief Complaint  Patient presents with   Acute Visit    Decubitus ulcer    HPI Patient is in today for acute visit.   Pt's wife reports she noticed a decubitus ulcer last week. Pt reports his buttock hurting and wife initially thought it was hemorrhoids. She gave him some miralax  and he continued to report buttock pain. She took a look and noticed 2 ulcers on each side of his buttocks. Nondraining. Some redness. She placed bandaid at the area.   Pt would like flu vaccine.   Review of Systems  Skin:        Pressure injury buttock  All other systems reviewed and are negative.       Objective:    There were no vitals taken for this visit. BP Readings from Last 3 Encounters:  02/01/24 113/69  01/27/24 128/66  01/14/24 (!) 140/86      Physical Exam Vitals and nursing note reviewed.  Constitutional:      Appearance: Normal appearance. He is obese.  HENT:     Head: Normocephalic and atraumatic.     Right Ear: External ear normal.     Left Ear: External ear normal.     Nose: Nose normal.     Mouth/Throat:     Mouth: Mucous membranes are moist.     Pharynx: Oropharynx is clear.  Eyes:     Conjunctiva/sclera: Conjunctivae normal.     Pupils: Pupils are equal, round, and reactive to light.  Cardiovascular:     Rate and Rhythm: Bradycardia present.  Pulmonary:     Effort: Pulmonary effort is normal.  Skin:    General: Skin is warm.     Capillary Refill: Capillary refill takes less than 2 seconds.     Comments: 2 decubitus ulcers stage 1 to left and right buttock, no drainage, no erythema  Neurological:     General: No focal deficit present.     Mental Status: He is alert and oriented to person, place, and time. Mental status is at baseline.  Psychiatric:        Mood and Affect: Mood normal.        Behavior: Behavior normal.        Thought  Content: Thought content normal.        Judgment: Judgment normal.    No results found for any visits on 02/01/24.      Assessment & Plan:   Problem List Items Addressed This Visit   None Visit Diagnoses       Immunization due    -  Primary   Relevant Orders   Flu vaccine HIGH DOSE PF(Fluzone  Trivalent)      Pressure injury of sacral region, stage 1 -     Doxycycline  Hyclate; Take 1 tablet (100 mg total) by mouth 2 (two) times daily for 7 days.  Dispense: 14 tablet; Refill: 0 -     Mupirocin ; Apply 1 Application topically daily.  Dispense: 30 g; Refill: 0  Immunization due -     Flu vaccine HIGH DOSE PF(Fluzone  Trivalent)   Pt with stage 1 pressure ulcer to buttocks. Nondraining. Area was cleansed and dressed today. Advised to dress every other day using similar methods. Mupirocin  ointment rx sent to apply along with bandage.  Doxycycline  100mg  BID x 7 days. Advised to use cushion  when sitting.  Flu vaccine today.  No orders of the defined types were placed in this encounter.   No follow-ups on file.  Torrence CINDERELLA Barrier, MD

## 2024-02-01 NOTE — Progress Notes (Signed)
 Patient seen for wound re-check. Wound has healed well with no opening areas noted. No redness, drainage or concern for infection noted. Patient aware to call if any redness, drainage, fever or chills arise. Patient voiced understanding and agreeable to plan. Patient declined a new AVS printed.

## 2024-02-20 ENCOUNTER — Other Ambulatory Visit: Payer: Self-pay

## 2024-02-20 ENCOUNTER — Ambulatory Visit
Admission: EM | Admit: 2024-02-20 | Discharge: 2024-02-20 | Disposition: A | Discharge status: Home / Self Care | Attending: Family Medicine | Admitting: Family Medicine

## 2024-02-20 DIAGNOSIS — J441 Chronic obstructive pulmonary disease with (acute) exacerbation: Secondary | ICD-10-CM | POA: Diagnosis not present

## 2024-02-20 DIAGNOSIS — R0602 Shortness of breath: Secondary | ICD-10-CM | POA: Diagnosis not present

## 2024-02-20 MED ORDER — IPRATROPIUM-ALBUTEROL 0.5-2.5 (3) MG/3ML IN SOLN
3.0000 mL | RESPIRATORY_TRACT | Status: AC
  Administered 2024-02-20: 3 mL via RESPIRATORY_TRACT

## 2024-02-20 MED ORDER — IPRATROPIUM-ALBUTEROL 0.5-2.5 (3) MG/3ML IN SOLN: 3.0000 mL | Freq: Four times a day (QID) | RESPIRATORY_TRACT | 0 refills | Status: DC | PRN

## 2024-02-20 MED ORDER — METHYLPREDNISOLONE SODIUM SUCC 125 MG IJ SOLR
125.0000 mg | Freq: Once | INTRAMUSCULAR | Status: AC
  Administered 2024-02-20: 125 mg via INTRAMUSCULAR

## 2024-02-20 MED ORDER — PREDNISONE 10 MG (21) PO TBPK: ORAL_TABLET | Freq: Every day | ORAL | 0 refills | Status: DC

## 2024-02-20 NOTE — ED Triage Notes (Signed)
 [facility] to [facility] with [facility]. Pt reports he started having SOB and wheezing since last night. Pt wife reports she used the caned oxygen  last night to help some.

## 2024-02-20 NOTE — ED Provider Notes (Signed)
 [name]    CSN: [id number] Arrival date & time: 02/20/24  1301      History   Chief Complaint Chief Complaint  Patient presents with   Shortness of Breath    HPI [name] [name] is a 83 y.o. male.   HPI Very pleasant 83 year old male presents with shortness of breath and wheezing last night.  Patient is accompanied by his wife this afternoon.  PMH significant for permanent atrial fibrillation, chronic systolic heart failure, CLL, acute CVA, and COPD.  Patient is currently on apixaban  and denies any unusual bleeding.  Past Medical History:  Diagnosis Date   A-fib (HCC) 01/2014   AICD (automatic cardioverter/defibrillator) present    [employer]   Anal fissure 11/10/2019   Arthritis    ASHD (arteriosclerotic heart disease) 2015   s/p CABG   Cataract    s/p left   CHF (congestive heart failure) (HCC)    COPD (chronic obstructive pulmonary disease) (HCC)    by CXR, pt unsure of this   Diverticulosis    GERD (gastroesophageal reflux disease)    Gout 2014   Hyperlipidemia    Hypertension    Hypothyroidism    LBBB (left bundle branch block)    Polycythemia vera (HCC)    Prediabetes 01/2014   Presence of permanent cardiac pacemaker    Sleep apnea    no CPAP    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) 12/15/2022   Acute CVA (cerebrovascular accident) (HCC) 09/16/2022   Chronic diastolic CHF (congestive heart failure) (HCC) 09/16/2022   Encounter for screening colonoscopy 08/26/2022   Chronic ulcer of left foot with fat layer exposed (HCC) 07/10/2022   Zenker's diverticulum 10/16/2021   Loss of appetite 10/10/2021   Loss of weight 10/10/2021   Generalized abdominal pain 10/10/2021   Thrombosed external hemorrhoid 10/10/2021    Dysphagia 08/01/2021   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   Intolerance of continuous positive airway pressure (CPAP) ventilation 05/02/2021   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   JAK2 [id number] mutation 08/27/2020   Rectal pain 11/10/2019   Constipation 11/10/2019   Abnormal glucose 10/12/2019   Memory changes 01/10/2019   Polycythemia vera (HCC) 10/21/2018   Thrombocytosis 12/29/2017   Senile purpura 09/01/2017   Emphysema of lung (HCC) 06/01/2017   Tortuous aorta (HCC)- CXR 2018 06/01/2017   Regular astigmatism, right eye 10/16/2016   Combined forms of age-related cataract of right eye 10/16/2016   Trigger ring finger of left hand 08/06/2016   Bilateral carpal tunnel syndrome 08/06/2016   Ischemic cardiomyopathy 06/22/2015   ASHD/CABG x 3V (11/2013) 02/06/2015   Acquired thrombophilia (HCC) 02/06/2015   Cardiac pacemaker/AICD (01/2014) 02/06/2015   Morbid obesity (HCC) 01/03/2015   HTN (hypertension) 01/02/2015   Hyperlipidemia 01/02/2015   Vitamin D  deficiency 01/02/2015   GERD  01/02/2015   BPH (benign prostatic hyperplasia) 01/02/2015   Medication management 01/02/2015   Atrial fibrillation (HCC) 01/02/2015   Hypothyroidism 01/02/2015   OSA and COPD overlap syndrome (HCC) 01/02/2015   Chronic systolic (congestive) heart failure (HCC) 02/17/2014   S/P CABG x 3 02/13/2014   Atherosclerotic heart disease of native coronary artery without angina pectoris 02/08/2014   Gout 02/08/2014   Cardiomyopathy (HCC) 02/08/2014   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) 02/06/2014  Past Surgical History:  Procedure Laterality Date   BIV ICD GENERATOR CHANGEOUT N/A 01/14/2024   Procedure: BIV ICD GENERATOR CHANGEOUT;  Surgeon: Inocencio Soyla Lunger, MD;  Location: [facility] INVASIVE CV LAB;  Service: Cardiovascular;  Laterality: N/A;   CARDIAC DEFIBRILLATOR PLACEMENT  07/2014   CATARACT EXTRACTION W/ INTRAOCULAR LENS IMPLANT Left    COLONOSCOPY  08/26/2022    normal   CORONARY ARTERY BYPASS GRAFT  11/2013   ESOPHAGOGASTRODUODENOSCOPY  08/26/2022   esophageal dilation   lumb laminectomy  [phone]   x 3   LUMBAR FUSION  2013   NASAL SEPTOPLASTY W/ TURBINOPLASTY Bilateral 01/30/2016   Procedure: NASAL SEPTOPLASTY WITH BILATERAL TURBINATE REDUCTION;  Surgeon: Lonni FORBES Angle, MD;  Location: [facility] OR;  Service: ENT;  Laterality: Bilateral;   TONSILLECTOMY     UVULECTOMY N/A 01/30/2016   Procedure: OPHELIA;  Surgeon: Lonni FORBES Angle, MD;  Location: [facility] OR;  Service: ENT;  Laterality: N/A;       Home Medications    Prior to Admission medications   Medication Sig Start Date End Date Taking? Authorizing Provider  ipratropium-albuterol  (DUONEB) 0.5-2.5 (3) MG/3ML SOLN Take 3 mLs by nebulization every 6 (six) hours as needed. 02/20/24  Yes Teddy Sharper, FNP  predniSONE  (STERAPRED UNI-PAK 21 TAB) 10 MG (21) TBPK tablet Take by mouth daily. Take 6 tabs by mouth daily  for 2 days, then 5 tabs for 2 days, then 4 tabs for 2 days, then 3 tabs for 2 days, 2 tabs for 2 days, then 1 tab by mouth daily for 2 days 02/20/24  Yes Teddy Sharper, FNP  acetaminophen  (TYLENOL ) 500 MG tablet Take 1,000 mg by mouth in the morning.    [provider]  allopurinol  (ZYLOPRIM ) 300 MG tablet TAKE 1 TABLET EVERY DAY TO PREVENT GOUT 05/11/23   Colette Torrence GRADE, MD  aspirin  EC 81 MG tablet Take 81 mg by mouth daily.    [provider]  Cholecalciferol (VITAMIN D -3) 125 MCG (5000 UT) TABS Take 5,000 Units by mouth daily.    [provider]  Choline Dihydrogen Citrate (CHOLINE CITRATE PO) Take 1 tablet by mouth at bedtime.    [provider]  Cyanocobalamin  (B-12) 1000 MCG CAPS Take 5,000 mg by mouth daily. 2500  mg each    [provider]  diphenhydrAMINE  (BENADRYL ) 25 mg capsule Take 25 mg by mouth at bedtime.    [provider]  docusate sodium  (COLACE) 100 MG capsule Take 100 mg by mouth daily with lunch.     [provider]  donepezil  (ARICEPT ) 10 MG tablet Take 1 tablet (10 mg total) by mouth at bedtime. 01/04/24   Sherryl Bouchard, NP  ELIQUIS  5 MG TABS tablet TAKE 1 TABLET TWICE DAILY 05/29/23   Colette Torrence GRADE, MD  ezetimibe  (ZETIA ) 10 MG tablet TAKE 1 TABLET EVERY DAY 07/22/23   Colette Torrence GRADE, MD  finasteride  (PROSCAR ) 5 MG tablet TAKE 1 TABLET EVERY DAY 05/15/23   Rucker, Torrence GRADE, MD  Ginger, Zingiber officinalis, (GINGER ROOT) 550 MG CAPS Take 550 mg by mouth 2 (two) times daily before a meal.    [provider]  hydroxyurea  (HYDREA ) 500 MG capsule TAKE 2 CAPSULES ONE TIME DAILY WITH FOOD 07/21/23   Onesimo Emaline Brink, MD  isosorbide  mononitrate (IMDUR ) 30 MG 24 hr tablet TAKE 1 TABLET EVERY DAY 05/15/23   Rucker, Torrence GRADE, MD  levothyroxine  (SYNTHROID ) 200 MCG tablet TAKE 1 TAB DAILY ON EMPTY STOMACH WITH ONLY WATER FOR  30 MINS. NO ANTACIDS, CALCIUM  OR MAGNESIUM  FOR 4 HRS. AVOID BIOTIN. 11/19/23   Colette Torrence GRADE, MD  magnesium  oxide (MAG-OX) 400 MG tablet Take 400 mg by mouth at bedtime.    [provider]  Melatonin 10 MG TABS Take 10 mg by mouth at bedtime.    [provider]  memantine  (NAMENDA ) 10 MG tablet Take 1 tablet (10 mg total) by mouth 2 (two) times daily. 01/04/24   Millikan, Megan, NP  metoprolol  (TOPROL -XL) 200 MG 24 hr tablet Take 1 tablet (200 mg total) by mouth daily. 12/09/23   Colette Torrence GRADE, MD  Multiple Vitamin (MULTIVITAMIN) tablet Take 1 tablet by mouth daily.    [provider]  mupirocin  ointment (BACTROBAN ) 2 % Apply 1 Application topically daily. 02/01/24   Rucker, Torrence GRADE, MD  omeprazole  (PRILOSEC) 20 MG capsule TAKE 1 CAPSULE TWICE DAILY  ( EVERY 12 HOURS  ) TO PREVENT HEARTBURN AND ACID REFLUX 09/23/23   Colette Torrence GRADE, MD  POLYETHYLENE GLYCOL 3350  PO Take 1 Capful by mouth at bedtime.    [provider]  Probiotic Product (PROBIOTIC-10 PO) Take 1 tablet by mouth daily.    [provider]   ramelteon  (ROZEREM ) 8 MG tablet TAKE 1 TABLET AT BEDTIME 12/07/23   Rucker, Torrence GRADE, MD  rosuvastatin  (CRESTOR ) 40 MG tablet TAKE 1 TABLET BY MOUTH DAILY. 05/15/23   Colette Torrence GRADE, MD  tamsulosin  (FLOMAX ) 0.4 MG CAPS capsule TAKE 1 CAPSULE AT BEDTIME FOR PROSTATE AND BLADDER 05/15/23   Colette Torrence GRADE, MD  torsemide  (DEMADEX ) 20 MG tablet TAKE 1 TABLET EVERY MORNING FOR SWELLING IN LEGS 05/15/23   Colette Torrence GRADE, MD  Turmeric 500 MG TABS Take 500 mg by mouth daily.    [provider]    Family History Family History  Problem Relation Age of Onset   Alzheimer's disease Mother    Mental illness Mother    Depression Mother    Heart disease Father 36       had 5 MI's & died 33 yo   Hypertension Father    Alcohol abuse Son    Heart attack Son    Stroke Maternal Grandmother    Dementia Maternal Grandmother    Cancer - Prostate Other    Colon cancer Neg Hx    Stomach cancer Neg Hx    Esophageal cancer Neg Hx     Social History Social History   Tobacco Use   Smoking status: Former    Current packs/day: 0.00    Average packs/day: 2.0 packs/day for 25.0 years (50.0 ttl pk-yrs)    Types: Cigarettes    Start date: 05/19/1958    Quit date: 05/20/1983    Years since quitting: 40.7   Smokeless tobacco: Never  Vaping Use   Vaping status: Never Used  Substance Use Topics   Alcohol use: Never   Drug use: Never     Allergies   Other   Review of Systems Review of Systems  Respiratory:  Positive for shortness of breath.   All other systems reviewed and are negative.    Physical Exam Triage Vital Signs ED Triage Vitals  Encounter Vitals Group     BP      Girls Systolic BP Percentile      Girls Diastolic BP Percentile      Boys Systolic BP Percentile      Boys Diastolic BP Percentile      Pulse      Resp  Temp      Temp src      SpO2      Weight      Height      Head Circumference      Peak Flow      Pain Score      Pain Loc      Pain  Education      Exclude from Growth Chart    No data found.  Updated Vital Signs BP 131/75   Pulse 63   Temp 97.6 F (36.4 C)   Resp (!) 25   SpO2 92%    Physical Exam Vitals and nursing note reviewed.  Constitutional:      General: He is not in acute distress.    Appearance: Normal appearance. He is obese. He is ill-appearing.  HENT:     Head: Normocephalic and atraumatic.     Right Ear: Tympanic membrane, ear canal and external ear normal.     Left Ear: Tympanic membrane, ear canal and external ear normal.     Mouth/Throat:     Mouth: Mucous membranes are moist.     Pharynx: Oropharynx is clear.  Eyes:     Extraocular Movements: Extraocular movements intact.     Conjunctiva/sclera: Conjunctivae normal.     Pupils: Pupils are equal, round, and reactive to light.  Cardiovascular:     Rate and Rhythm: Normal rate and regular rhythm.     Pulses: Normal pulses.     Heart sounds: Normal heart sounds.  Pulmonary:     Effort: Pulmonary effort is normal.     Breath sounds: Wheezing present. No rhonchi or rales.     Comments: Diffuse very mild scattered wheezes, diminished breath sounds noted throughout; post DuoNeb/IM Solu-Medrol: Much improved air movement throughout no wheezes, rales, or crackles Musculoskeletal:        General: Normal range of motion.  Skin:    General: Skin is warm and dry.  Neurological:     General: No focal deficit present.     Mental Status: He is alert and oriented to person, place, and time. Mental status is at baseline.  Psychiatric:        Mood and Affect: Mood normal.        Behavior: Behavior normal.      UC Treatments / Results  Labs (all labs ordered are listed, but only abnormal results are displayed) Labs Reviewed - No data to display  EKG   Radiology No results found.  Procedures Procedures (including critical care time)  Medications Ordered in UC Medications  methylPREDNISolone sodium succinate (SOLU-MEDROL) 125 mg/2 mL  injection 125 mg (125 mg Intramuscular Given 02/20/24 1329)  ipratropium-albuterol  (DUONEB) 0.5-2.5 (3) MG/3ML nebulizer solution 3 mL (3 mLs Nebulization Given 02/20/24 1329)    Initial Impression / Assessment and Plan / UC Course  I have reviewed the triage vital signs and the nursing notes.  Pertinent labs & imaging results that were available during my care of the patient were reviewed by me and considered in my medical decision making (see chart for details).     MDM: 1.  COPD exacerbation-I am Solu-Medrol 125 mg given once in clinic and prior to discharge, Rx'd Sterapred Unipak (42 tab 10 mg taper); 2.  Shortness of breath-DuoNeb nebulizer given once and clinic and prior to discharge, Rx'd DuoNeb 0.5-2 5 (3) MG/3 mL nebulizer: Take 3 mL by nebulization every 6 hours as needed for shortness of breath-DuoNeb nebulizer machine provided from clinic to  patient prior to discharge. Advised patient/wife take medication as directed with food to completion.  Advised patient/wife start Sterapred Unipak tomorrow morning Sunday, 02/21/2024.  Advised may use DuoNeb nebulizer daily, as needed every 6 hours for shortness of breath.  Advised wife and patient if pulse ox drops consistently below 92% please go to [facility] ED for further evaluation.   Final Clinical Impressions(s) / UC Diagnoses   Final diagnoses:  SOB (shortness of breath)  COPD exacerbation (HCC)     Discharge Instructions      Advised patient/wife take medication as directed with food to completion.  Advised patient/wife start Sterapred Unipak tomorrow morning Sunday, 02/21/2024.  Advised may use DuoNeb nebulizer daily, as needed every 6 hours for shortness of breath.  Advised wife and patient if pulse ox drops consistently below 92% please go to [facility] ED for further evaluation.      ED Prescriptions     Medication Sig Dispense Auth. Provider   ipratropium-albuterol  (DUONEB) 0.5-2.5  (3) MG/3ML SOLN Take 3 mLs by nebulization every 6 (six) hours as needed. 360 mL Teddy Sharper, FNP   predniSONE  (STERAPRED UNI-PAK 21 TAB) 10 MG (21) TBPK tablet Take by mouth daily. Take 6 tabs by mouth daily  for 2 days, then 5 tabs for 2 days, then 4 tabs for 2 days, then 3 tabs for 2 days, 2 tabs for 2 days, then 1 tab by mouth daily for 2 days 42 tablet Teddy Sharper, FNP      PDMP not reviewed this encounter.   Teddy Sharper, FNP 02/20/24 1402

## 2024-02-20 NOTE — Discharge Instructions (Addendum)
 Advised patient/wife take medication as directed with food to completion.  Advised patient/wife start Sterapred Unipak tomorrow morning Sunday, 02/21/2024.  Advised may use DuoNeb nebulizer daily, as needed every 6 hours for shortness of breath.  Advised wife and patient if pulse ox drops consistently below 92% please go to [facility] ED for further evaluation.

## 2024-02-23 ENCOUNTER — Ambulatory Visit: Attending: Cardiovascular Disease | Admitting: Cardiovascular Disease

## 2024-02-23 ENCOUNTER — Telehealth: Payer: Self-pay

## 2024-02-23 VITALS — BP 126/70 | HR 61 | Ht 69.0 in | Wt 252.0 lb

## 2024-02-23 DIAGNOSIS — E782 Mixed hyperlipidemia: Secondary | ICD-10-CM | POA: Diagnosis not present

## 2024-02-23 DIAGNOSIS — I4811 Longstanding persistent atrial fibrillation: Secondary | ICD-10-CM | POA: Diagnosis not present

## 2024-02-23 DIAGNOSIS — I350 Nonrheumatic aortic (valve) stenosis: Secondary | ICD-10-CM | POA: Insufficient documentation

## 2024-02-23 DIAGNOSIS — I2510 Atherosclerotic heart disease of native coronary artery without angina pectoris: Secondary | ICD-10-CM | POA: Insufficient documentation

## 2024-02-23 DIAGNOSIS — I5032 Chronic diastolic (congestive) heart failure: Secondary | ICD-10-CM | POA: Insufficient documentation

## 2024-02-23 DIAGNOSIS — I10 Essential (primary) hypertension: Secondary | ICD-10-CM | POA: Insufficient documentation

## 2024-02-23 DIAGNOSIS — I5022 Chronic systolic (congestive) heart failure: Secondary | ICD-10-CM | POA: Diagnosis not present

## 2024-02-23 MED ORDER — TORSEMIDE 20 MG PO TABS: ORAL_TABLET | ORAL | 3 refills | Status: DC

## 2024-02-23 NOTE — Assessment & Plan Note (Signed)
 History of hyperlipidemia on Zetia  and high-dose rosuvastatin  with lipid profile performed 06/11/2023 revealing total cholesterol 141, LDL of 90 and HDL of 23, not at goal for secondary prevention.  He may benefit from a PCSK9.

## 2024-02-23 NOTE — Assessment & Plan Note (Signed)
 History of chronic diastolic heart failure with echo performed 09/24/2023 showing normal EF, moderate left trickle hypertrophy, mild to moderate elevated right heart pressures and severe aortic stenosis with a valve area of 0.94 cm and a peak gradient of 58 mmHg.  He is on torsemide  20 mg a day.  He has gained 17 pounds over the last 3 weeks as a result of [place] and dietary indiscretion.  He clearly is volume overloaded with increased jugular venous pressure and to 3+ pitting edema.  I am going to double his torsemide  to twice daily, check routine blood work including a basic metabolic panel and BMP today and again in 7 to 10 days I will have him see APP back in 2 to 3 weeks in follow-up.

## 2024-02-23 NOTE — Assessment & Plan Note (Signed)
 2D echo performed 09/24/2023 revealed an aortic valve area of 0.94 cm with a peak gradient of 58 mmHg.  He may be a candidate for TAVR.  He did have a decubitus ulcer that has healed.  I am going to refer him to the structural heart clinic for further evaluation.

## 2024-02-23 NOTE — Assessment & Plan Note (Addendum)
 History of CAD status post CABG in 2015.  He denies chest pain.  He had a LIMA to his LAD, vein to a diagonal and obtuse marginal branch.

## 2024-02-23 NOTE — Assessment & Plan Note (Signed)
History of persistent A-fib rate controlled on Eliquis oral anticoagulation. 

## 2024-02-23 NOTE — Assessment & Plan Note (Signed)
 History of essential hypertension blood pressure measured today at 126/70.  He is on metoprolol .

## 2024-02-23 NOTE — Telephone Encounter (Signed)
 Alert remote transmission:  - Cardiac Resynchronization Therapy pacing of < 85%. Pacing was 70% between Feb 22, 2024 01:40 and Feb 23, 2024 02:07.   Repeat alert, routed 10/6 and alert was decreased to 80%,   Patient recently seen in urgent care on 10/4 with COPD exacerbation.  Treated with nebulizer and prescribed oral prednisone .  Signs on device show suspicion of fluid retention as well: ie: elevated heart and respiratory rates as well as trend down in thoracic impedance.   Spoke with patient's wife:  He is not doing well.  Continues with shortness of breath, extreme fatigue, visible significant fluid retention in his extremities. They do not have ability to check oxygen  at home.  He is taking his torsemide  daily.  Symptoms correlate with device data and concerns for acute heart failure exacerbation.  Forwarding to Dr. Pietro and his triage team for next steps. Wife waiting for call back.  She is very concerned.

## 2024-02-23 NOTE — Progress Notes (Signed)
 02/23/2024 [name] [name]   09-28-1940  [id number]  Primary Physician [name] [name] GRADE, MD Primary Cardiologist: [name] JINNY Lesches MD [name] [name], [place]  HPI:  [name] [name] is a 83 y.o. mildly overweight married Caucasian male father of 6 children between he and his wife, grandfather of 16 grandchildren who is accompanied by his wife [name] [name].  He is a cardiology patient of Dr. Vertie.  His risk factors include treated hypertension and hyperlipidemia.  He stopped smoking in 1985.  He is retired Training and development officer.  He is originally from [place]  and his wife grew up in [place].  This is his second marriage.  His history is notable for CABG in 2015.  He had a stroke 09/15/2021 that affected his memory and speech.  Does have some slow cognitive decline.  Over the last several weeks he and his wife have been on vacation in [place]  and [place]  for family reunion.  He is gained 17 pounds during that time and clearly is volume overload.  He has had progressive dyspnea on exertion.  He does have persistent A-fib on Eliquis  oral anticoagulation.   Current Meds  Medication Sig   acetaminophen  (TYLENOL ) 500 MG tablet Take 1,000 mg by mouth in the morning.   allopurinol  (ZYLOPRIM ) 300 MG tablet TAKE 1 TABLET EVERY DAY TO PREVENT GOUT   aspirin  EC 81 MG tablet Take 81 mg by mouth daily.   Cholecalciferol (VITAMIN D -3) 125 MCG (5000 UT) TABS Take 5,000 Units by mouth daily.   Choline Dihydrogen Citrate (CHOLINE CITRATE PO) Take 1 tablet by mouth at bedtime.   Cyanocobalamin  (B-12) 1000 MCG CAPS Take 5,000 mg by mouth daily. 2500  mg each   diphenhydrAMINE  (BENADRYL ) 25 mg capsule Take 25 mg by mouth at bedtime.   docusate sodium  (COLACE) 100 MG capsule Take 100 mg by mouth daily with lunch.   donepezil  (ARICEPT ) 10 MG tablet Take 1 tablet (10 mg total) by mouth at bedtime.   ELIQUIS  5 MG TABS tablet TAKE 1 TABLET TWICE DAILY   ezetimibe  (ZETIA ) 10 MG tablet TAKE 1  TABLET EVERY DAY   finasteride  (PROSCAR ) 5 MG tablet TAKE 1 TABLET EVERY DAY   Ginger, Zingiber officinalis, (GINGER ROOT) 550 MG CAPS Take 550 mg by mouth 2 (two) times daily before a meal.   hydroxyurea  (HYDREA ) 500 MG capsule TAKE 2 CAPSULES ONE TIME DAILY WITH FOOD   ipratropium-albuterol  (DUONEB) 0.5-2.5 (3) MG/3ML SOLN Take 3 mLs by nebulization every 6 (six) hours as needed.   isosorbide  mononitrate (IMDUR ) 30 MG 24 hr tablet TAKE 1 TABLET EVERY DAY   levothyroxine  (SYNTHROID ) 200 MCG tablet TAKE 1 TAB DAILY ON EMPTY STOMACH WITH ONLY WATER FOR 30 MINS. NO ANTACIDS, CALCIUM  OR MAGNESIUM  FOR 4 HRS. AVOID BIOTIN.   magnesium  oxide (MAG-OX) 400 MG tablet Take 400 mg by mouth at bedtime.   Melatonin 10 MG TABS Take 10 mg by mouth at bedtime.   memantine  (NAMENDA ) 10 MG tablet Take 1 tablet (10 mg total) by mouth 2 (two) times daily.   metoprolol  (TOPROL -XL) 200 MG 24 hr tablet Take 1 tablet (200 mg total) by mouth daily.   Multiple Vitamin (MULTIVITAMIN) tablet Take 1 tablet by mouth daily.   mupirocin  ointment (BACTROBAN ) 2 % Apply 1 Application topically daily.   omeprazole  (PRILOSEC) 20 MG capsule TAKE 1 CAPSULE TWICE DAILY  ( EVERY 12 HOURS  ) TO PREVENT HEARTBURN AND ACID REFLUX   POLYETHYLENE GLYCOL 3350  PO  Take 1 Capful by mouth at bedtime.   predniSONE  (STERAPRED UNI-PAK 21 TAB) 10 MG (21) TBPK tablet Take by mouth daily. Take 6 tabs by mouth daily  for 2 days, then 5 tabs for 2 days, then 4 tabs for 2 days, then 3 tabs for 2 days, 2 tabs for 2 days, then 1 tab by mouth daily for 2 days   Probiotic Product (PROBIOTIC-10 PO) Take 1 tablet by mouth daily.   ramelteon  (ROZEREM ) 8 MG tablet TAKE 1 TABLET AT BEDTIME   rosuvastatin  (CRESTOR ) 40 MG tablet TAKE 1 TABLET BY MOUTH DAILY.   tamsulosin  (FLOMAX ) 0.4 MG CAPS capsule TAKE 1 CAPSULE AT BEDTIME FOR PROSTATE AND BLADDER   Turmeric 500 MG TABS Take 500 mg by mouth daily.   [DISCONTINUED] torsemide  (DEMADEX ) 20 MG tablet TAKE 1 TABLET  EVERY MORNING FOR SWELLING IN LEGS     Allergies  Allergen Reactions   Other Swelling    SODIUM SENSITIVE    Social History   Socioeconomic History   Marital status: Married    Spouse name: [name] [name]    Number of children: 2   Years of education: 13   Highest education level: Not on file  Occupational History   Occupation: Retired  Tobacco Use   Smoking status: Former    Current packs/day: 0.00    Average packs/day: 2.0 packs/day for 25.0 years (50.0 ttl pk-yrs)    Types: Cigarettes    Start date: 05/19/1958    Quit date: 05/20/1983    Years since quitting: 40.7   Smokeless tobacco: Never  Vaping Use   Vaping status: Never Used  Substance and Sexual Activity   Alcohol use: Never   Drug use: Never   Sexual activity: Not Currently  Other Topics Concern   Not on file  Social History Narrative   Lives with wife, [name] [name]   Caffeine use: daily (Coffee) decaf   Social Drivers of Health   Financial Resource Strain: Unknown (07/28/2019)   Received from [facility]    5. Do you ever have problems being able to afford everything you need?: Not on file    4. Over the last 6 months, have you had trouble paying your heating or electricity bill?: Not on file  Food Insecurity: No Food Insecurity (06/05/2021)   Received from [facility]   Hunger Vital Sign    Within the past 12 months, you worried that your food would run out before you got the money to buy more.: Never true    Within the past 12 months, the food you bought just didn't last and you didn't have money to get more.: Never true  Transportation Needs: No Transportation Needs (03/29/2021)   PRAPARE - Administrator, Civil Service (Medical): No    Lack of Transportation (Non-Medical): No  Physical Activity: Not on file  Stress: Not on file  Social Connections: Unknown (09/18/2021)   Received from [facility]   Social Network    Social Network: Not on file  Intimate Partner  Violence: Unknown (08/20/2021)   Received from [facility]   HITS    Physically Hurt: Not on file    Insult or Talk Down To: Not on file    Threaten Physical Harm: Not on file    Scream or Curse: Not on file     Review of Systems: General: negative for chills, fever, night sweats or weight changes.  Cardiovascular: negative for chest pain, dyspnea  on exertion, edema, orthopnea, palpitations, paroxysmal nocturnal dyspnea or shortness of breath Dermatological: negative for rash Respiratory: negative for cough or wheezing Urologic: negative for hematuria Abdominal: negative for nausea, vomiting, diarrhea, bright red blood per rectum, melena, or hematemesis Neurologic: negative for visual changes, syncope, or dizziness All other systems reviewed and are otherwise negative except as noted above.    Blood pressure 126/70, pulse 61, height 5' 9 (1.753 m), weight 252 lb (114.3 kg), SpO2 (!) 89%.  General appearance: alert and no distress Neck: no adenopathy, no carotid bruit, supple, symmetrical, trachea midline, thyroid  not enlarged, symmetric, no tenderness/mass/nodules, and increased jugular venous pressure Lungs: clear to auscultation bilaterally Heart: 2/6 outflow tract murmur consistent with aortic stenosis Extremities: 2-3+ pitting edema bilaterally Pulses: 2+ and symmetric Skin: Skin color, texture, turgor normal. No rashes or lesions Neurologic: Grossly normal  EKG not performed today      ASSESSMENT AND PLAN:   HTN (hypertension) History of essential hypertension blood pressure measured today at 126/70.  He is on metoprolol .  Hyperlipidemia History of hyperlipidemia on Zetia  and high-dose rosuvastatin  with lipid profile performed 06/11/2023 revealing total cholesterol 141, LDL of 90 and HDL of 23, not at goal for secondary prevention.  He may benefit from a PCSK9.  Atrial fibrillation (HCC) History of persistent A-fib rate controlled on Eliquis  oral  anticoagulation.  ASHD/CABG x 3V (11/2013) History of CAD status post CABG in 2015.  He denies chest pain.  He had a LIMA to his LAD, vein to a diagonal and obtuse marginal branch.  Chronic diastolic CHF (congestive heart failure) (HCC) History of chronic diastolic heart failure with echo performed 09/24/2023 showing normal EF, moderate left trickle hypertrophy, mild to moderate elevated right heart pressures and severe aortic stenosis with a valve area of 0.94 cm and a peak gradient of 58 mmHg.  He is on torsemide  20 mg a day.  He has gained 17 pounds over the last 3 weeks as a result of his recent trip out [place] and dietary indiscretion.  He clearly is volume overloaded with increased jugular venous pressure and to 3+ pitting edema.  I am going to double his torsemide  to twice daily, check routine blood work including a basic metabolic panel and BMP today and again in 7 to 10 days I will have him see APP back in 2 to 3 weeks in follow-up.  Severe aortic stenosis 2D echo performed 09/24/2023 revealed an aortic valve area of 0.94 cm with a peak gradient of 58 mmHg.  He may be a candidate for TAVR.  He did have a decubitus ulcer that has healed.  I am going to refer him to the structural heart clinic for further evaluation.     [name] DOROTHA Lesches MD FACP,FACC,FAHA, [facility] 02/23/2024 1:58 PM

## 2024-02-23 NOTE — Patient Instructions (Signed)
 Medication Instructions:  Your physician has recommended you make the following change in your medication:   -Increase Torsemide  (demadex ) to 20mg  twice daily until goal weight of 235lbs is achieved, then resume 20mg  once daily.  *If you need a refill on your cardiac medications before your next appointment, please call your pharmacy*  Lab Work: Your physician recommends that you have labs drawn today: BMET & BNP   Your physician recommends that you return for lab work in: 7-10 days for BMET & BNP   If you have labs (blood work) drawn today and your tests are completely normal, you will receive your results only by: MyChart Message (if you have MyChart) OR A paper copy in the mail If you have any lab test that is abnormal or we need to change your treatment, we will call you to review the results.   Follow-Up: At [facility], you and your health needs are our priority.  As part of our continuing mission to provide you with exceptional heart care, our providers are all part of one team.  This team includes your primary Cardiologist (physician) and Advanced Practice Providers or APPs (Physician Assistants and Nurse Practitioners) who all work together to provide you with the care you need, when you need it.  Your next appointment:   3 week(s)  Provider:   Orren Fabry, PA-C, Jon Hails, PA-C, Callie Goodrich, PA-C, Kathleen Johnson, PA-C, or Hao Meng, PA-C        Then, Redell Shallow, MD will plan to see you again in 3 month(s).     We recommend signing up for the patient portal called MyChart.  Sign up information is provided on this After Visit Summary.  MyChart is used to connect with patients for Virtual Visits (Telemedicine).  Patients are able to view lab/test results, encounter notes, upcoming appointments, etc.  Non-urgent messages can be sent to your provider as well.   To learn more about what you can do with MyChart, go to [email or web address].

## 2024-02-23 NOTE — Telephone Encounter (Signed)
 Spoke with pt's wife, DPR regarding patient's current symptoms.  Pt continues to use nebulizer without improvement of symptoms.  Appointment scheduled with Dr Court, DOD today at 115pm as Dr Pietro is out of the office this week.  Pt's wife verbalizes understanding and thanked Charity fundraiser for the call back.

## 2024-02-24 ENCOUNTER — Ambulatory Visit: Payer: Self-pay | Admitting: Cardiovascular Disease

## 2024-02-24 LAB — BRAIN NATRIURETIC PEPTIDE: BNP: 511.1 pg/mL — ABNORMAL HIGH (ref 0.0–100.0)

## 2024-02-24 LAB — BASIC METABOLIC PANEL WITH GFR
BUN/Creatinine Ratio: 21 (ref 10–24)
BUN: 25 mg/dL (ref 8–27)
CO2: 30 mmol/L — ABNORMAL HIGH (ref 20–29)
Calcium: 9.6 mg/dL (ref 8.6–10.2)
Chloride: 94 mmol/L — ABNORMAL LOW (ref 96–106)
Creatinine, Ser: 1.19 mg/dL (ref 0.76–1.27)
Glucose: 122 mg/dL — ABNORMAL HIGH (ref 70–99)
Potassium: 4.1 mmol/L (ref 3.5–5.2)
Sodium: 138 mmol/L (ref 134–144)
eGFR: 61 mL/min/1.73 (ref >59)

## 2024-02-25 NOTE — H&P (View-Only) (Signed)
 Patient ID: [name] [name] MRN: [id number] DOB/AGE: Jan 05, 1941 83 y.o.  Primary Care Physician:Rucker, Torrence GRADE, MD Primary Cardiologist: Pietro  CC:  Aortic valvular disease management     FOCUSED PROBLEM LIST:   Aortic stenosis AVA 0.94, SVI 37, DI 0.3 MG 36, V-max 3.8, EF 50 to 55% TTE May 2025 EKG: V pacing CAD CABG LIMA to LAD, vein graft to OM, vein graft to diagonal 2015 Chronic diastolic heart failure  Defers Jardiance   Hypertension  Hyperlipidemia  Permanent atrial fibrillation Status post BiV ICD Early onset dementia Alzheimer's and vascular dementia BMI 37/BSA 2.19 February 2024:  Patient consents to use of AI scribe. The patient is an 83 year old male with the above listed medical problems referred for recommendations regarding his aortic stenosis.  He was seen by Dr. Court recently was found to be volume overloaded.  His torsemide  was increased.  He has been experiencing leg swelling, for which his torsemide  dose was recently doubled, resulting in a weight reduction from 252 to 235 pounds. An ultrasound of his heart showed an abnormality with one of his heart valves, which led to this consultation.  He experiences regular shortness of breath, which worsens when lying flat, leading him to sleep in a recliner for the past six months. He denies recent chest discomfort, lightheadedness, or near blackouts. His physical activity is limited to walking around the house and occasional trips to the grocery store, where he moves slowly but manages to navigate the aisles.  He is on a blood thinner for atrial fibrillation and has not experienced any significant bleeding issues, although he does bruise easily.  He has early onset dementia, as diagnosed by Dr. Ines, and experiences some forgetfulness, but remains oriented and functional in daily activities. His wife notes that he occasionally forgets details of TV series but recognizes familiar faces.  No dental issues.         Past Medical History:  Diagnosis Date   A-fib (HCC) 01/2014   AICD (automatic cardioverter/defibrillator) present    [employer]   Anal fissure 11/10/2019   Arthritis    ASHD (arteriosclerotic heart disease) 2015   s/p CABG   Cataract    s/p left   CHF (congestive heart failure) (HCC)    COPD (chronic obstructive pulmonary disease) (HCC)    by CXR, pt unsure of this   Diverticulosis    GERD (gastroesophageal reflux disease)    Gout 2014   Hyperlipidemia    Hypertension    Hypothyroidism    LBBB (left bundle branch block)    Polycythemia vera (HCC)    Prediabetes 01/2014   Presence of permanent cardiac pacemaker    Sleep apnea    no CPAP    Past Surgical History:  Procedure Laterality Date   BIV ICD GENERATOR CHANGEOUT N/A 01/14/2024   Procedure: BIV ICD GENERATOR CHANGEOUT;  Surgeon: Inocencio Soyla Lunger, MD;  Location: [facility] INVASIVE CV LAB;  Service: Cardiovascular;  Laterality: N/A;   CARDIAC DEFIBRILLATOR PLACEMENT  07/2014   CATARACT EXTRACTION W/ INTRAOCULAR LENS IMPLANT Left    COLONOSCOPY  08/26/2022   normal   CORONARY ARTERY BYPASS GRAFT  11/2013   ESOPHAGOGASTRODUODENOSCOPY  08/26/2022   esophageal dilation   lumb laminectomy  [phone]   x 3   LUMBAR FUSION  2013   NASAL SEPTOPLASTY W/ TURBINOPLASTY Bilateral 01/30/2016   Procedure: NASAL SEPTOPLASTY WITH BILATERAL TURBINATE REDUCTION;  Surgeon: Lonni FORBES Angle, MD;  Location: [facility] OR;  Service: ENT;  Laterality: Bilateral;  TONSILLECTOMY     UVULECTOMY N/A 01/30/2016   Procedure: UVULECTOMY;  Surgeon: Lonni FORBES Angle, MD;  Location: [facility] OR;  Service: ENT;  Laterality: N/A;    Family History  Problem Relation Age of Onset   Alzheimer's disease Mother    Mental illness Mother    Depression Mother    Heart disease Father 86       had 5 MI's & died 74 yo   Hypertension Father    Alcohol abuse Son    Heart attack Son    Stroke Maternal Grandmother    Dementia Maternal  Grandmother    Cancer - Prostate Other    Colon cancer Neg Hx    Stomach cancer Neg Hx    Esophageal cancer Neg Hx     Social History   Socioeconomic History   Marital status: Married    Spouse name: [name]    Number of children: 2   Years of education: 13   Highest education level: Not on file  Occupational History   Occupation: Retired  Tobacco Use   Smoking status: Former    Current packs/day: 0.00    Average packs/day: 2.0 packs/day for 25.0 years (50.0 ttl pk-yrs)    Types: Cigarettes    Start date: 05/19/1958    Quit date: 05/20/1983    Years since quitting: 40.8   Smokeless tobacco: Never  Vaping Use   Vaping status: Never Used  Substance and Sexual Activity   Alcohol use: Never   Drug use: Never   Sexual activity: Not Currently  Other Topics Concern   Not on file  Social History Narrative   Lives with wife, [name]   Caffeine use: daily (Coffee) decaf   Social Drivers of Health   Financial Resource Strain: Unknown (07/28/2019)   Received from [facility] Strain    5. Do you ever have problems being able to afford everything you need?: Not on file    4. Over the last 6 months, have you had trouble paying your heating or electricity bill?: Not on file  Food Insecurity: No Food Insecurity (06/05/2021)   [facility] from [facility]   Hunger Vital Sign    Within the past 12 months, you worried that your food would run out before you got the money to buy more.: Never true    Within the past 12 months, the food you bought just didn't last and you didn't have money to get more.: Never true  Transportation Needs: No Transportation Needs (03/29/2021)   PRAPARE - Administrator, Civil Service (Medical): No    Lack of Transportation (Non-Medical): No  Physical Activity: Not on file  Stress: Not on file  Social Connections: Unknown (09/18/2021)   Received from [facility]   Social Network    Social Network: Not on file  Intimate Partner  Violence: Unknown (08/20/2021)   Received from [facility]   HITS    Physically Hurt: Not on file    Insult or Talk Down To: Not on file    Threaten Physical Harm: Not on file    Scream or Curse: Not on file     Prior to Admission medications   Medication Sig Start Date End Date Taking? Authorizing Provider  acetaminophen  (TYLENOL ) 500 MG tablet Take 1,000 mg by mouth in the morning.    [provider]  allopurinol  (ZYLOPRIM ) 300 MG tablet TAKE 1 TABLET EVERY DAY TO PREVENT GOUT 05/11/23   Rucker,  Torrence GRADE, MD  aspirin  EC 81 MG tablet Take 81 mg by mouth daily.    [provider]  Cholecalciferol (VITAMIN D -3) 125 MCG (5000 UT) TABS Take 5,000 Units by mouth daily.    [provider]  Choline Dihydrogen Citrate (CHOLINE CITRATE PO) Take 1 tablet by mouth at bedtime.    [provider]  Cyanocobalamin  (B-12) 1000 MCG CAPS Take 5,000 mg by mouth daily. 2500  mg each    [provider]  diphenhydrAMINE  (BENADRYL ) 25 mg capsule Take 25 mg by mouth at bedtime.    [provider]  docusate sodium  (COLACE) 100 MG capsule Take 100 mg by mouth daily with lunch.    [provider]  donepezil  (ARICEPT ) 10 MG tablet Take 1 tablet (10 mg total) by mouth at bedtime. 01/04/24   Sherryl Bouchard, NP  ELIQUIS  5 MG TABS tablet TAKE 1 TABLET TWICE DAILY 05/29/23   Colette Torrence GRADE, MD  ezetimibe  (ZETIA ) 10 MG tablet TAKE 1 TABLET EVERY DAY 07/22/23   Colette Torrence GRADE, MD  finasteride  (PROSCAR ) 5 MG tablet TAKE 1 TABLET EVERY DAY 05/15/23   Rucker, Alethea Y, MD  Ginger, Zingiber officinalis, (GINGER ROOT) 550 MG CAPS Take 550 mg by mouth 2 (two) times daily before a meal.    [provider]  hydroxyurea  (HYDREA ) 500 MG capsule TAKE 2 CAPSULES ONE TIME DAILY WITH FOOD 07/21/23   Onesimo Emaline Brink, MD  ipratropium-albuterol  (DUONEB) 0.5-2.5 (3) MG/3ML SOLN Take 3 mLs by nebulization every 6 (six) hours as needed. 02/20/24   Ragan, Michael, FNP   isosorbide  mononitrate (IMDUR ) 30 MG 24 hr tablet TAKE 1 TABLET EVERY DAY 05/15/23   Rucker, Torrence GRADE, MD  levothyroxine  (SYNTHROID ) 200 MCG tablet TAKE 1 TAB DAILY ON EMPTY STOMACH WITH ONLY WATER FOR 30 MINS. NO ANTACIDS, CALCIUM  OR MAGNESIUM  FOR 4 HRS. AVOID BIOTIN. 11/19/23   Colette Torrence GRADE, MD  magnesium  oxide (MAG-OX) 400 MG tablet Take 400 mg by mouth at bedtime.    [provider]  Melatonin 10 MG TABS Take 10 mg by mouth at bedtime.    [provider]  memantine  (NAMENDA ) 10 MG tablet Take 1 tablet (10 mg total) by mouth 2 (two) times daily. 01/04/24   Millikan, Megan, NP  metoprolol  (TOPROL -XL) 200 MG 24 hr tablet Take 1 tablet (200 mg total) by mouth daily. 12/09/23   Colette Torrence GRADE, MD  Multiple Vitamin (MULTIVITAMIN) tablet Take 1 tablet by mouth daily.    [provider]  mupirocin  ointment (BACTROBAN ) 2 % Apply 1 Application topically daily. 02/01/24   Rucker, Torrence GRADE, MD  omeprazole  (PRILOSEC) 20 MG capsule TAKE 1 CAPSULE TWICE DAILY  ( EVERY 12 HOURS  ) TO PREVENT HEARTBURN AND ACID REFLUX 09/23/23   Colette Torrence GRADE, MD  POLYETHYLENE GLYCOL 3350  PO Take 1 Capful by mouth at bedtime.    [provider]  predniSONE  (STERAPRED UNI-PAK 21 TAB) 10 MG (21) TBPK tablet Take by mouth daily. Take 6 tabs by mouth daily  for 2 days, then 5 tabs for 2 days, then 4 tabs for 2 days, then 3 tabs for 2 days, 2 tabs for 2 days, then 1 tab by mouth daily for 2 days 02/20/24   Teddy Sharper, FNP  Probiotic Product (PROBIOTIC-10 PO) Take 1 tablet by mouth daily.    [provider]  ramelteon  (ROZEREM ) 8 MG tablet TAKE 1 TABLET AT BEDTIME 12/07/23   Colette Torrence GRADE, MD  rosuvastatin  (  CRESTOR ) 40 MG tablet TAKE 1 TABLET BY MOUTH DAILY. 05/15/23   Colette Torrence GRADE, MD  tamsulosin  (FLOMAX ) 0.4 MG CAPS capsule TAKE 1 CAPSULE AT BEDTIME FOR PROSTATE AND BLADDER 05/15/23   Colette Torrence GRADE, MD  torsemide  (DEMADEX ) 20 MG tablet Take 1 tablet (20mg ) twice  daily until goal weight of 235lbs is achieved then take 1 tablet (20mg ) once daily. 02/23/24   Court Dorn PARAS, MD  Turmeric 500 MG TABS Take 500 mg by mouth daily.    [provider]    Allergies  Allergen Reactions   Other Swelling    SODIUM SENSITIVE    REVIEW OF SYSTEMS:  General: no fevers/chills/night sweats Eyes: no blurry vision, diplopia, or amaurosis ENT: no sore throat or hearing loss Resp: no cough, wheezing, or hemoptysis CV: no edema or palpitations GI: no abdominal pain, nausea, vomiting, diarrhea, or constipation GU: no dysuria, frequency, or hematuria Skin: no rash Neuro: no headache, numbness, tingling, or weakness of extremities Musculoskeletal: no joint pain or swelling Heme: no bleeding, DVT, or easy bruising Endo: no polydipsia or polyuria  BP 138/74   Pulse 60   Ht 5' 9 (1.753 m)   Wt 228 lb (103.4 kg)   SpO2 96%   BMI 33.67 kg/m   PHYSICAL EXAM: GEN:  AO x 3 in no acute distress HEENT: normal Dentition: Dentures Neck: JVP normal. +2 carotid upstrokes without bruits. No thyromegaly. Lungs: equal expansion, clear bilaterally CV: Apex is discrete and nondisplaced, RRR with 3/6 SEM Abd: soft, non-tender, non-distended; no bruit; positive bowel sounds Ext: no edema, ecchymoses, or cyanosis Vascular: 2+ femoral pulses, 2+ radial pulses       Skin: warm and dry without rash Neuro: CN II-XII grossly intact; motor and sensory grossly intact    DATA AND STUDIES:  EKG: 2000 25V paced rhythm with occasional PVCs  EKG Interpretation Date/Time:  Thursday March 03 2024 14:09:10 EDT Ventricular Rate:  68 PR Interval:    QRS Duration:  170 QT Interval:  474 QTC Calculation: 504 R Axis:   164  Text Interpretation: Atrial fibrillation with occasional ventricular-paced complexes Left bundle branch block When compared with ECG of 14-Jan-2024 12:56, Premature ventricular complexes are no longer Present Vent. rate has increased BY   7 BPM  Confirmed by Brecken Walth ([id number]) on 03/03/2024 2:27:16 PM        CARDIAC STUDIES: Refer to CV Procedures and Imaging Tabs  06/11/2023: TSH 3.020 11/17/2023: ALT 14 12/22/2023: Hemoglobin 15.7; Platelets 448 02/23/2024: BNP 511.1; BUN 25; Creatinine, Ser 1.19; Potassium 4.1; Sodium 138   STS RISK CALCULATOR: Pending  NYHA CLASS: 2    ASSESSMENT AND PLAN:   1. Nonrheumatic aortic valve stenosis   2. ASHD/CABG x 3V (11/2013)   3. Chronic diastolic CHF (congestive heart failure) (HCC)   4. Primary hypertension   5. Hyperlipidemia LDL goal <70   6. Permanent atrial fibrillation (HCC)   7. Secondary hypercoagulable state   8. Biventricular ICD (implantable cardioverter-defibrillator) in place   9. Pre-procedure lab exam     Aortic stenosis: The patient has developed NYHA class II symptoms of shortness of breath as well as heart failure.  I was concerned about the patient's early onset dementia.  He seems to be somewhat forgetful but he is able to do most of his activities of daily living and he recognizes most things in people.  I do not think his dementia should preclude him from an aortic valve intervention.  It is clear  that he has greater than a 1 year life expectancy.  We had a long conversation about potential treatment options.  The patient and his wife are interested in an aortic valve intervention.  Will refer him for coronary angiography study, right heart catheterization, CTA, and cardiothoracic surgical appointment.  Further recommendations will be issued after review of all imaging data. Coronary artery disease: Continue Eliquis  5 mg twice daily, discontinue aspirin  continue rosuvastatin  40 mg Chronic diastolic heart failure: Continue Toprol  20 mg daily, torsemide  20 mg daily, patient defers SGLT2 inhibitor Hypertension: Continue Toprol  200 mg daily.  BP above goal but will optimize regimen following aortic valve intervention. Hyperlipidemia: Continue rosuvastatin  40 mg, Zetia   10 mg Permanent atrial fibrillation: Continue Eliquis  5 mg twice daily, Toprol  200 mg daily Secondary hypercoagulable state: Continue Eliquis  5 mg twice daily BiV ICD: Followed by EP   I have personally reviewed the patients imaging data as summarized above.  I have reviewed the natural history of aortic stenosis with the patient and family members who are present today. We have discussed the limitations of medical therapy and the poor prognosis associated with symptomatic aortic stenosis. We have also reviewed potential treatment options, including palliative medical therapy, conventional surgical aortic valve replacement, and transcatheter aortic valve replacement. We discussed treatment options in the context of this patient's specific comorbid medical conditions.   All of the patient's questions were answered today. Will make further recommendations based on the results of studies outlined above.   I spent 42 minutes reviewing all clinical data during and prior to this visit including all relevant imaging studies, laboratories, clinical information from other health systems and prior notes from both Cardiology and other specialties, interviewing the patient, conducting a complete physical examination, and coordinating care in order to formulate a comprehensive and personalized evaluation and treatment plan.   Toyna Erisman K Elsa Ploch, MD  03/03/2024 2:27 PM    [facility] [address] [place], [place], [place]  [address] Phone: [phone]; Fax: [phone]

## 2024-02-25 NOTE — Progress Notes (Signed)
 Remote ICD Transmission

## 2024-02-25 NOTE — Progress Notes (Signed)
 Patient ID: [name] [name] MRN: [id number] DOB/AGE: Jan 05, 1941 83 y.o.  Primary Care Physician:Rucker, Torrence GRADE, MD Primary Cardiologist: Pietro  CC:  Aortic valvular disease management     FOCUSED PROBLEM LIST:   Aortic stenosis AVA 0.94, SVI 37, DI 0.3 MG 36, V-max 3.8, EF 50 to 55% TTE May 2025 EKG: V pacing CAD CABG LIMA to LAD, vein graft to OM, vein graft to diagonal 2015 Chronic diastolic heart failure  Defers Jardiance   Hypertension  Hyperlipidemia  Permanent atrial fibrillation Status post BiV ICD Early onset dementia Alzheimer's and vascular dementia BMI 37/BSA 2.19 February 2024:  Patient consents to use of AI scribe. The patient is an 83 year old male with the above listed medical problems referred for recommendations regarding his aortic stenosis.  He was seen by Dr. Court recently was found to be volume overloaded.  His torsemide  was increased.  He has been experiencing leg swelling, for which his torsemide  dose was recently doubled, resulting in a weight reduction from 252 to 235 pounds. An ultrasound of his heart showed an abnormality with one of his heart valves, which led to this consultation.  He experiences regular shortness of breath, which worsens when lying flat, leading him to sleep in a recliner for the past six months. He denies recent chest discomfort, lightheadedness, or near blackouts. His physical activity is limited to walking around the house and occasional trips to the grocery store, where he moves slowly but manages to navigate the aisles.  He is on a blood thinner for atrial fibrillation and has not experienced any significant bleeding issues, although he does bruise easily.  He has early onset dementia, as diagnosed by Dr. Ines, and experiences some forgetfulness, but remains oriented and functional in daily activities. His wife notes that he occasionally forgets details of TV series but recognizes familiar faces.  No dental issues.         Past Medical History:  Diagnosis Date   A-fib (HCC) 01/2014   AICD (automatic cardioverter/defibrillator) present    [employer]   Anal fissure 11/10/2019   Arthritis    ASHD (arteriosclerotic heart disease) 2015   s/p CABG   Cataract    s/p left   CHF (congestive heart failure) (HCC)    COPD (chronic obstructive pulmonary disease) (HCC)    by CXR, pt unsure of this   Diverticulosis    GERD (gastroesophageal reflux disease)    Gout 2014   Hyperlipidemia    Hypertension    Hypothyroidism    LBBB (left bundle branch block)    Polycythemia vera (HCC)    Prediabetes 01/2014   Presence of permanent cardiac pacemaker    Sleep apnea    no CPAP    Past Surgical History:  Procedure Laterality Date   BIV ICD GENERATOR CHANGEOUT N/A 01/14/2024   Procedure: BIV ICD GENERATOR CHANGEOUT;  Surgeon: Inocencio Soyla Lunger, MD;  Location: [facility] INVASIVE CV LAB;  Service: Cardiovascular;  Laterality: N/A;   CARDIAC DEFIBRILLATOR PLACEMENT  07/2014   CATARACT EXTRACTION W/ INTRAOCULAR LENS IMPLANT Left    COLONOSCOPY  08/26/2022   normal   CORONARY ARTERY BYPASS GRAFT  11/2013   ESOPHAGOGASTRODUODENOSCOPY  08/26/2022   esophageal dilation   lumb laminectomy  [phone]   x 3   LUMBAR FUSION  2013   NASAL SEPTOPLASTY W/ TURBINOPLASTY Bilateral 01/30/2016   Procedure: NASAL SEPTOPLASTY WITH BILATERAL TURBINATE REDUCTION;  Surgeon: Lonni FORBES Angle, MD;  Location: [facility] OR;  Service: ENT;  Laterality: Bilateral;  TONSILLECTOMY     UVULECTOMY N/A 01/30/2016   Procedure: UVULECTOMY;  Surgeon: Lonni FORBES Angle, MD;  Location: [facility] OR;  Service: ENT;  Laterality: N/A;    Family History  Problem Relation Age of Onset   Alzheimer's disease Mother    Mental illness Mother    Depression Mother    Heart disease Father 86       had 5 MI's & died 74 yo   Hypertension Father    Alcohol abuse Son    Heart attack Son    Stroke Maternal Grandmother    Dementia Maternal  Grandmother    Cancer - Prostate Other    Colon cancer Neg Hx    Stomach cancer Neg Hx    Esophageal cancer Neg Hx     Social History   Socioeconomic History   Marital status: Married    Spouse name: [name]    Number of children: 2   Years of education: 13   Highest education level: Not on file  Occupational History   Occupation: Retired  Tobacco Use   Smoking status: Former    Current packs/day: 0.00    Average packs/day: 2.0 packs/day for 25.0 years (50.0 ttl pk-yrs)    Types: Cigarettes    Start date: 05/19/1958    Quit date: 05/20/1983    Years since quitting: 40.8   Smokeless tobacco: Never  Vaping Use   Vaping status: Never Used  Substance and Sexual Activity   Alcohol use: Never   Drug use: Never   Sexual activity: Not Currently  Other Topics Concern   Not on file  Social History Narrative   Lives with wife, [name]   Caffeine use: daily (Coffee) decaf   Social Drivers of Health   Financial Resource Strain: Unknown (07/28/2019)   Received from [facility] Strain    5. Do you ever have problems being able to afford everything you need?: Not on file    4. Over the last 6 months, have you had trouble paying your heating or electricity bill?: Not on file  Food Insecurity: No Food Insecurity (06/05/2021)   [facility] from [facility]   Hunger Vital Sign    Within the past 12 months, you worried that your food would run out before you got the money to buy more.: Never true    Within the past 12 months, the food you bought just didn't last and you didn't have money to get more.: Never true  Transportation Needs: No Transportation Needs (03/29/2021)   PRAPARE - Administrator, Civil Service (Medical): No    Lack of Transportation (Non-Medical): No  Physical Activity: Not on file  Stress: Not on file  Social Connections: Unknown (09/18/2021)   Received from [facility]   Social Network    Social Network: Not on file  Intimate Partner  Violence: Unknown (08/20/2021)   Received from [facility]   HITS    Physically Hurt: Not on file    Insult or Talk Down To: Not on file    Threaten Physical Harm: Not on file    Scream or Curse: Not on file     Prior to Admission medications   Medication Sig Start Date End Date Taking? Authorizing Provider  acetaminophen  (TYLENOL ) 500 MG tablet Take 1,000 mg by mouth in the morning.    [provider]  allopurinol  (ZYLOPRIM ) 300 MG tablet TAKE 1 TABLET EVERY DAY TO PREVENT GOUT 05/11/23   Rucker,  Torrence GRADE, MD  aspirin  EC 81 MG tablet Take 81 mg by mouth daily.    [provider]  Cholecalciferol (VITAMIN D -3) 125 MCG (5000 UT) TABS Take 5,000 Units by mouth daily.    [provider]  Choline Dihydrogen Citrate (CHOLINE CITRATE PO) Take 1 tablet by mouth at bedtime.    [provider]  Cyanocobalamin  (B-12) 1000 MCG CAPS Take 5,000 mg by mouth daily. 2500  mg each    [provider]  diphenhydrAMINE  (BENADRYL ) 25 mg capsule Take 25 mg by mouth at bedtime.    [provider]  docusate sodium  (COLACE) 100 MG capsule Take 100 mg by mouth daily with lunch.    [provider]  donepezil  (ARICEPT ) 10 MG tablet Take 1 tablet (10 mg total) by mouth at bedtime. 01/04/24   Sherryl Bouchard, NP  ELIQUIS  5 MG TABS tablet TAKE 1 TABLET TWICE DAILY 05/29/23   Colette Torrence GRADE, MD  ezetimibe  (ZETIA ) 10 MG tablet TAKE 1 TABLET EVERY DAY 07/22/23   Colette Torrence GRADE, MD  finasteride  (PROSCAR ) 5 MG tablet TAKE 1 TABLET EVERY DAY 05/15/23   Rucker, Alethea Y, MD  Ginger, Zingiber officinalis, (GINGER ROOT) 550 MG CAPS Take 550 mg by mouth 2 (two) times daily before a meal.    [provider]  hydroxyurea  (HYDREA ) 500 MG capsule TAKE 2 CAPSULES ONE TIME DAILY WITH FOOD 07/21/23   Onesimo Emaline Brink, MD  ipratropium-albuterol  (DUONEB) 0.5-2.5 (3) MG/3ML SOLN Take 3 mLs by nebulization every 6 (six) hours as needed. 02/20/24   Ragan, Michael, FNP   isosorbide  mononitrate (IMDUR ) 30 MG 24 hr tablet TAKE 1 TABLET EVERY DAY 05/15/23   Rucker, Torrence GRADE, MD  levothyroxine  (SYNTHROID ) 200 MCG tablet TAKE 1 TAB DAILY ON EMPTY STOMACH WITH ONLY WATER FOR 30 MINS. NO ANTACIDS, CALCIUM  OR MAGNESIUM  FOR 4 HRS. AVOID BIOTIN. 11/19/23   Colette Torrence GRADE, MD  magnesium  oxide (MAG-OX) 400 MG tablet Take 400 mg by mouth at bedtime.    [provider]  Melatonin 10 MG TABS Take 10 mg by mouth at bedtime.    [provider]  memantine  (NAMENDA ) 10 MG tablet Take 1 tablet (10 mg total) by mouth 2 (two) times daily. 01/04/24   Millikan, Megan, NP  metoprolol  (TOPROL -XL) 200 MG 24 hr tablet Take 1 tablet (200 mg total) by mouth daily. 12/09/23   Colette Torrence GRADE, MD  Multiple Vitamin (MULTIVITAMIN) tablet Take 1 tablet by mouth daily.    [provider]  mupirocin  ointment (BACTROBAN ) 2 % Apply 1 Application topically daily. 02/01/24   Rucker, Torrence GRADE, MD  omeprazole  (PRILOSEC) 20 MG capsule TAKE 1 CAPSULE TWICE DAILY  ( EVERY 12 HOURS  ) TO PREVENT HEARTBURN AND ACID REFLUX 09/23/23   Colette Torrence GRADE, MD  POLYETHYLENE GLYCOL 3350  PO Take 1 Capful by mouth at bedtime.    [provider]  predniSONE  (STERAPRED UNI-PAK 21 TAB) 10 MG (21) TBPK tablet Take by mouth daily. Take 6 tabs by mouth daily  for 2 days, then 5 tabs for 2 days, then 4 tabs for 2 days, then 3 tabs for 2 days, 2 tabs for 2 days, then 1 tab by mouth daily for 2 days 02/20/24   Teddy Sharper, FNP  Probiotic Product (PROBIOTIC-10 PO) Take 1 tablet by mouth daily.    [provider]  ramelteon  (ROZEREM ) 8 MG tablet TAKE 1 TABLET AT BEDTIME 12/07/23   Colette Torrence GRADE, MD  rosuvastatin  (  CRESTOR ) 40 MG tablet TAKE 1 TABLET BY MOUTH DAILY. 05/15/23   Colette Torrence GRADE, MD  tamsulosin  (FLOMAX ) 0.4 MG CAPS capsule TAKE 1 CAPSULE AT BEDTIME FOR PROSTATE AND BLADDER 05/15/23   Colette Torrence GRADE, MD  torsemide  (DEMADEX ) 20 MG tablet Take 1 tablet (20mg ) twice  daily until goal weight of 235lbs is achieved then take 1 tablet (20mg ) once daily. 02/23/24   Court Dorn PARAS, MD  Turmeric 500 MG TABS Take 500 mg by mouth daily.    [provider]    Allergies  Allergen Reactions   Other Swelling    SODIUM SENSITIVE    REVIEW OF SYSTEMS:  General: no fevers/chills/night sweats Eyes: no blurry vision, diplopia, or amaurosis ENT: no sore throat or hearing loss Resp: no cough, wheezing, or hemoptysis CV: no edema or palpitations GI: no abdominal pain, nausea, vomiting, diarrhea, or constipation GU: no dysuria, frequency, or hematuria Skin: no rash Neuro: no headache, numbness, tingling, or weakness of extremities Musculoskeletal: no joint pain or swelling Heme: no bleeding, DVT, or easy bruising Endo: no polydipsia or polyuria  BP 138/74   Pulse 60   Ht 5' 9 (1.753 m)   Wt 228 lb (103.4 kg)   SpO2 96%   BMI 33.67 kg/m   PHYSICAL EXAM: GEN:  AO x 3 in no acute distress HEENT: normal Dentition: Dentures Neck: JVP normal. +2 carotid upstrokes without bruits. No thyromegaly. Lungs: equal expansion, clear bilaterally CV: Apex is discrete and nondisplaced, RRR with 3/6 SEM Abd: soft, non-tender, non-distended; no bruit; positive bowel sounds Ext: no edema, ecchymoses, or cyanosis Vascular: 2+ femoral pulses, 2+ radial pulses       Skin: warm and dry without rash Neuro: CN II-XII grossly intact; motor and sensory grossly intact    DATA AND STUDIES:  EKG: 2000 25V paced rhythm with occasional PVCs  EKG Interpretation Date/Time:  Thursday March 03 2024 14:09:10 EDT Ventricular Rate:  68 PR Interval:    QRS Duration:  170 QT Interval:  474 QTC Calculation: 504 R Axis:   164  Text Interpretation: Atrial fibrillation with occasional ventricular-paced complexes Left bundle branch block When compared with ECG of 14-Jan-2024 12:56, Premature ventricular complexes are no longer Present Vent. rate has increased BY   7 BPM  Confirmed by Brecken Walth ([id number]) on 03/03/2024 2:27:16 PM        CARDIAC STUDIES: Refer to CV Procedures and Imaging Tabs  06/11/2023: TSH 3.020 11/17/2023: ALT 14 12/22/2023: Hemoglobin 15.7; Platelets 448 02/23/2024: BNP 511.1; BUN 25; Creatinine, Ser 1.19; Potassium 4.1; Sodium 138   STS RISK CALCULATOR: Pending  NYHA CLASS: 2    ASSESSMENT AND PLAN:   1. Nonrheumatic aortic valve stenosis   2. ASHD/CABG x 3V (11/2013)   3. Chronic diastolic CHF (congestive heart failure) (HCC)   4. Primary hypertension   5. Hyperlipidemia LDL goal <70   6. Permanent atrial fibrillation (HCC)   7. Secondary hypercoagulable state   8. Biventricular ICD (implantable cardioverter-defibrillator) in place   9. Pre-procedure lab exam     Aortic stenosis: The patient has developed NYHA class II symptoms of shortness of breath as well as heart failure.  I was concerned about the patient's early onset dementia.  He seems to be somewhat forgetful but he is able to do most of his activities of daily living and he recognizes most things in people.  I do not think his dementia should preclude him from an aortic valve intervention.  It is clear  that he has greater than a 1 year life expectancy.  We had a long conversation about potential treatment options.  The patient and his wife are interested in an aortic valve intervention.  Will refer him for coronary angiography study, right heart catheterization, CTA, and cardiothoracic surgical appointment.  Further recommendations will be issued after review of all imaging data. Coronary artery disease: Continue Eliquis  5 mg twice daily, discontinue aspirin  continue rosuvastatin  40 mg Chronic diastolic heart failure: Continue Toprol  20 mg daily, torsemide  20 mg daily, patient defers SGLT2 inhibitor Hypertension: Continue Toprol  200 mg daily.  BP above goal but will optimize regimen following aortic valve intervention. Hyperlipidemia: Continue rosuvastatin  40 mg, Zetia   10 mg Permanent atrial fibrillation: Continue Eliquis  5 mg twice daily, Toprol  200 mg daily Secondary hypercoagulable state: Continue Eliquis  5 mg twice daily BiV ICD: Followed by EP   I have personally reviewed the patients imaging data as summarized above.  I have reviewed the natural history of aortic stenosis with the patient and family members who are present today. We have discussed the limitations of medical therapy and the poor prognosis associated with symptomatic aortic stenosis. We have also reviewed potential treatment options, including palliative medical therapy, conventional surgical aortic valve replacement, and transcatheter aortic valve replacement. We discussed treatment options in the context of this patient's specific comorbid medical conditions.   All of the patient's questions were answered today. Will make further recommendations based on the results of studies outlined above.   I spent 42 minutes reviewing all clinical data during and prior to this visit including all relevant imaging studies, laboratories, clinical information from other health systems and prior notes from both Cardiology and other specialties, interviewing the patient, conducting a complete physical examination, and coordinating care in order to formulate a comprehensive and personalized evaluation and treatment plan.   Toyna Erisman K Elsa Ploch, MD  03/03/2024 2:27 PM    [facility] [address] [place], [place], [place]  [address] Phone: [phone]; Fax: [phone]

## 2024-03-02 NOTE — Progress Notes (Signed)
 Cardiology Office Note   Date:  03/15/2024  ID:  OLIVER HEITZENRATER, DOB [birth year], MRN [id number] PCP: Colette Torrence GRADE, MD  [facility] Providers Cardiologist:  Redell Shallow, MD Electrophysiologist:  Will [name], MD   History of Present Illness [name] [name] is a 83 y.o. male with past medical history of CAD s/p CABG in 2015, permanent atrial fibrillation, aortic stenosis, biventricular ICD, heart failure with improved ejection fraction.  Patient was previously followed by [facility] health cardiology.  Later established care with Dr. Shallow in 05/2022.  Underwent echocardiogram in 09/2022 that showed EF 50-55%, no regional wall motion abnormalities, moderate LVH, grade 1 DD, normal RV systolic function, normal PA systolic pressure, severe biatrial enlargement.  There was decreased LV stroke-volume, suspected low-flow/low gradient moderate aortic stenosis with mild AI.  More recently, echocardiogram in 09/2023 showed EF 50-55% with no regional wall motion abnormalities, moderate LVH, low normal RV systolic function with mildly elevated PA systolic pressure.  There was mild MR, moderate TR, severe calcification of the aortic valve with mild AI, moderate-severe AS.  Patient underwent ICD GEN change with EP on 01/14/2024.  Has been followed by Dr. Norton.  Patient saw Dr. Shallow on 01/27/2024.  At that time, patient had some increased dyspnea on exertion and difficulties with his memory.  Denied chest pain or syncope.  Patient had decubitus ulcer and had been sleeping 16 hours/day.  Planned observation of aortic valve disease with consideration of TAVR in the future if he is a candidate.  Remained on Toprol  and Eliquis  for his atrial fibrillation.  Patient saw Dr. Court on 10/7 as a DOD visit.  Patient had gained 17 pounds over the past few weeks prior to his presentation.  Had progressive dyspnea on exertion.  His torsemide  dose was doubled.  He was also referred to the structural  heart clinic for further evaluation of his aortic stenosis  Today patient presents for follow-up appointment.  He is accompanied by his wife.  Patient feels like he is doing well overall.  Denies chest pain, palpitations, dizziness.  His swelling has improved significantly since he took increased doses of torsemide  for a few days.  He continues to have slight lower extremity swelling.  Also seems to have some orthopnea overnight and sleeps in a recliner.  Denies cough.  His weight was down to 228 on 10/16.  Weight is 234 today.  Studies Reviewed Cardiac Studies & Procedures   ______________________________________________________________________________________________ CARDIAC CATHETERIZATION  CARDIAC CATHETERIZATION 03/11/2024  Conclusion   Origin lesion is 100% stenosed.   Origin to Prox Graft lesion is 100% stenosed.   RPAV lesion is 80% stenosed.   RPDA lesion is 50% stenosed.   Prox Cx lesion is 100% stenosed.   Mid LAD lesion is 100% stenosed.  1.  Patent LIMA to LAD.  The vein graft to OM and vein graft to diagonal could not be selectively visualized and are likely occluded (however there does seem to be competitive flow in the diagonal on  injection of native left coronary artery). 2.  Capacious iliofemoral vessels bilaterally.  Recommendation: Continue evaluation for potential aortic valve intervention.  Findings Coronary Findings Diagnostic  Dominance: Right  Left Anterior Descending Mid LAD lesion is 100% stenosed.  Left Circumflex Prox Cx lesion is 100% stenosed.  Right Coronary Artery There is moderate diffuse disease throughout the vessel.  Right Posterior Descending Artery RPDA lesion is 50% stenosed.  Right Posterior Atrioventricular Artery RPAV lesion is 80% stenosed.  Graft To  2nd Mrg Origin lesion is 100% stenosed.  Graft To 1st Diag Origin to Prox Graft lesion is 100% stenosed.  LIMA Graft To Dist LAD  Intervention  No interventions have been  documented.     ECHOCARDIOGRAM  ECHOCARDIOGRAM COMPLETE 09/24/2023  Narrative ECHOCARDIOGRAM REPORT    Patient Name:   [name] [name] Date of Exam: 09/24/2023 Medical Rec #:  [id number]        Height:       67.0 in Accession #:    [id number]       Weight:       230.1 lb Date of Birth:  04/08/1941         BSA:          2.146 m Patient Age:    82 years         BP:           120/78 mmHg Patient Gender: M                HR:           56 bpm. Exam Location:  [place]  Procedure: 2D Echo, Cardiac Doppler and Color Doppler (Both Spectral and Color Flow Doppler were utilized during procedure).  Indications:    Nonrheumatic aortic valve stenosis [I35.0 (ICD-10-CM)]  History:        Patient has prior history of Echocardiogram examinations, most recent 09/17/2022. Cardiomyopathy, CAD, Prior CABG and Pacemaker, Pulmonary HTN, Stroke and CLL, Polycythemia vera, Aortic Valve Disease, Arrythmias:Atrial Fibrillation; Risk Factors:Hypertension and Sleep Apnea.  Sonographer:    Alan Greenhouse RDMS, RVT, RDCS Referring Phys: [id number] BRIAN S CRENSHAW  IMPRESSIONS   1. There is severe calcifcation of the aortic valve. Aortic valve regurgitation is mild. Moderate to severe aortic valve stenosis. Aortic valve area, by VTI measures 0.94 cm. Aortic valve mean gradient measures 36.0 mmHg. Aortic valve Vmax measures 3.80 m/s. 2. Left ventricular ejection fraction, by estimation, is 50 to 55%. The left ventricle has low normal function. The left ventricle has no regional wall motion abnormalities. There is moderate left ventricular hypertrophy. Left ventricular diastolic parameters are indeterminate. 3. Right ventricular systolic function is low normal. The right ventricular size is mildly enlarged. 4. There is mildly elevated pulmonary artery systolic pressure. The estimated right ventricular systolic pressure is 43.0 mmHg. 5. Left atrial size was severely dilated. 6. Right atrial size was severely  dilated. 7. The mitral valve is degenerative. Mild mitral valve regurgitation. No evidence of mitral stenosis. 8. Tricuspid valve regurgitation is moderate. 9. The inferior vena cava is dilated in size with >50% respiratory variability, suggesting right atrial pressure of 8 mmHg. 10. Rhythm strip during this exam demonstrates atrial fibrillation and a paced rhythm.  Comparison(s): Echocardiogram done 09/17/22 showed a EF of of 50-55% with moderate AS and an AV Mean Grad of . Gradients across AV progressed an appear severe by valve area calculated  FINDINGS Left Ventricle: Left ventricular ejection fraction, by estimation, is 50 to 55%. The left ventricle has low normal function. The left ventricle has no regional wall motion abnormalities. The left ventricular internal cavity size was normal in size. There is moderate left ventricular hypertrophy. Abnormal (paradoxical) septal motion, consistent with RV pacemaker. Left ventricular diastolic parameters are indeterminate.  Right Ventricle: The right ventricular size is mildly enlarged. Right vetricular wall thickness was not well visualized. Right ventricular systolic function is low normal. There is mildly elevated pulmonary artery systolic pressure. The tricuspid regurgitant velocity is 2.96 m/s, and  with an assumed right atrial pressure of 8 mmHg, the estimated right ventricular systolic pressure is 43.0 mmHg.  Left Atrium: Left atrial size was severely dilated.  Right Atrium: Right atrial size was severely dilated.  Pericardium: There is no evidence of pericardial effusion.  Mitral Valve: The mitral valve is degenerative in appearance. Mild mitral valve regurgitation. No evidence of mitral valve stenosis.  Tricuspid Valve: The tricuspid valve is not well visualized. Tricuspid valve regurgitation is moderate . No evidence of tricuspid stenosis.  Aortic Valve: The aortic valve is tricuspid. There is severe calcifcation of the aortic  valve. Aortic valve regurgitation is mild. Aortic regurgitation PHT measures 663 msec. Moderate to severe aortic stenosis is present. Aortic valve mean gradient measures 36.0 mmHg. Aortic valve peak gradient measures 57.8 mmHg. Aortic valve area, by VTI measures 0.94 cm.  Pulmonic Valve: The pulmonic valve was not well visualized. Pulmonic valve regurgitation is mild. No evidence of pulmonic stenosis.  Aorta: The aortic root is normal in size and structure and the ascending aorta was not well visualized.  Venous: The inferior vena cava is dilated in size with greater than 50% respiratory variability, suggesting right atrial pressure of 8 mmHg.  IAS/Shunts: The interatrial septum was not well visualized.  EKG: Rhythm strip during this exam demonstrates atrial fibrillation and a paced rhythm.  Additional Comments: A device lead is visualized.   LEFT VENTRICLE PLAX 2D LVIDd:         5.70 cm      Diastology LVIDs:         3.70 cm      LV e' medial:    6.53 cm/s LV PW:         1.00 cm      LV E/e' medial:  20.2 LV IVS:        1.30 cm      LV e' lateral:   8.81 cm/s LVOT diam:     2.00 cm      LV E/e' lateral: 15.0 LV SV:         79 LV SV Index:   37 LVOT Area:     3.14 cm  LV Volumes (MOD) LV vol d, MOD A2C: 184.0 ml LV vol d, MOD A4C: 149.0 ml LV vol s, MOD A2C: 77.2 ml LV vol s, MOD A4C: 61.7 ml LV SV MOD A2C:     106.8 ml LV SV MOD A4C:     149.0 ml LV SV MOD BP:      99.9 ml  RIGHT VENTRICLE RV S prime:     9.03 cm/s TAPSE (M-mode): 1.4 cm  LEFT ATRIUM              Index        RIGHT ATRIUM           Index LA diam:        5.10 cm  2.38 cm/m   RA Area:     30.20 cm LA Vol (A2C):   126.0 ml 58.70 ml/m  RA Volume:   105.00 ml 48.92 ml/m LA Vol (A4C):   135.0 ml 62.90 ml/m LA Biplane Vol: 134.0 ml 62.43 ml/m AORTIC VALVE AV Area (Vmax):    0.96 cm AV Area (Vmean):   0.93 cm AV Area (VTI):     0.94 cm AV Vmax:           380.00 cm/s AV Vmean:          274.333  cm/s AV VTI:  0.847 m AV Peak Grad:      57.8 mmHg AV Mean Grad:      36.0 mmHg LVOT Vmax:         116.00 cm/s LVOT Vmean:        81.300 cm/s LVOT VTI:          0.252 m LVOT/AV VTI ratio: 0.30 AI PHT:            663 msec AR Vena Contracta: 0.35 cm  AORTA Ao Root diam: 3.70 cm  MITRAL VALVE                    TRICUSPID VALVE MV Area (PHT): 4.86 cm         TR Peak grad:   35.0 mmHg MV Decel Time: 156 msec         TR Vmax:        296.00 cm/s MR Peak grad:      111.5 mmHg MR Mean grad:      71.0 mmHg    SHUNTS MR Vmax:           528.00 cm/s  Systemic VTI:  0.25 m MR Vmean:          398.0 cm/s   Systemic Diam: 2.00 cm MR Vena Contracta: 0.10 cm MR PISA:           0.25 cm MR PISA Eff ROA:   1 mm MR PISA Radius:    0.20 cm MV E velocity: 132.00 cm/s MV A velocity: 27.50 cm/s MV E/A ratio:  4.80  Sreedhar reddy Madireddy Electronically signed by Alean reddy Madireddy Signature Date/Time: 09/24/2023/4:53:12 PM    Final          ______________________________________________________________________________________________       Risk Assessment/Calculations  CHA2DS2-VASc Score = 5   This indicates a 7.2% annual risk of stroke. The patient's score is based upon: CHF History: 1 HTN History: 1 Diabetes History: 0 Stroke History: 0 Vascular Disease History: 1 Age Score: 2 Gender Score: 0   Physical Exam VS:  BP 124/70   Pulse 64   Ht 5' 9 (1.753 m)   Wt 234 lb 6.4 oz (106.3 kg)   SpO2 97%   BMI 34.61 kg/m        Wt Readings from Last 3 Encounters:  03/15/24 234 lb 6.4 oz (106.3 kg)  03/11/24 225 lb (102.1 kg)  03/03/24 228 lb (103.4 kg)    GEN: Well nourished, well developed in no acute distress. Sitting comfortably in the chair  NECK: No JVD  CARDIAC:  Irregular rate and rhythm. Grade 2/6 systolic  murmur at RUSB  RESPIRATORY:  Clear to auscultation without rales, wheezing or rhonchi. Normal WOB on room air   ABDOMEN: Soft, non-tender,  non-distended EXTREMITIES:  Trace edema in BLE; No deformity   ASSESSMENT AND PLAN  Chronic heart failure with improved EF  - Echocardiogram from 09/2023 showed EF 50-5%, no regional wall motion abnormalities, moderate LVH, low normal RV systolic function, mildly elevated PA systolic pressure.  There was moderate-severe AS, mild MR, moderate TR, mild AI - Patient seen by Dr. Court as DOD visit on 10/7.  Reported 17 pounds weight gain as well as worsening dyspnea on exertion. BNP elevated to 511. Torsemide  increased to 20 mg twice daily until weight goal of 235 pounds achieved, then instructed to take 20 mg daily  -Patient has been back on 20 mg daily for several days.  His wife helps weigh him  daily and keep track of his weights.  He continues to have slight lower extremity swelling. -Weight had been 228 pounds on 10/16.  Up to 234 today. -Continue torsemide  20 mg daily.  Discussed that they can take an additional 20 mg daily as needed for weight gain, lower extremity swelling. With trace ankle edema and weight gain, recommended taking a second dose today or tomorrow  - BMP from 10/7 showed creatinine 1.19, K4.1.  Ordered repeat BMP today - Patient previously declined Jardiance   CAD s/p CABG - Patient previously had CABG in 2015 - Cardiac catheterization 03/11/24 showed patent LIMA-LAD.  Vein graft to OM and vein graft to diagonal could not be selectively visualized and were likely occluded, however there did seem to be competitive flow in the diagonal on injection of native left coronary artery.  Recommended ongoing evaluation for potential aortic valve intervention -Left radial cath site soft, nontender.  Appears to be healing as expected. - Patient denies chest pain  - Continue aspirin  81 mg daily - Continue Crestor  40 mg daily and Zetia  10 mg daily - Continue metoprolol  succinate 200 mg daily and Imdur  30 mg daily  Moderate-severe aortic stenosis Mild MR Moderate TR - Noted on  echocardiogram 09/2023 - Seen by Dr. Wendel with structural heart 10/16-underwent cardiac catheterization 10/24 and is in the process of being evaluated for potential aortic valve intervention - Has appointment with Dr. Wendel in 03/2024 to further discuss   Permanent atrial fibrillation - Denies palpitations.  Heart rate well-controlled today at 64 bpm - Continue metoprolol  succinate 200 mg daily  - Continue eliquis  5 mg BID. This is the correct dose for him   Hyperlipidemia - Lipid panel from 05/2023 showed LDL 90, HDL 23, total cholesterol 858, triglycerides 157 - Continue Crestor  40 mg daily and Zetia  10 mg daily  Hypertension - BP well controlled  - Continue Imdur  30 mg daily, metoprolol  succinate 200 mg daily  S/P ICD  - Followed by EP    Dispo: Follow up with Dr. Wendel in 03/2024, Jodie Passey 04/2024, Dr. Pietro 05/2023 as arranged   Signed, Rollo FABIENE Louder, PA-C

## 2024-03-03 ENCOUNTER — Ambulatory Visit: Attending: Internal Medicine | Admitting: Internal Medicine

## 2024-03-03 ENCOUNTER — Encounter: Payer: Self-pay | Admitting: Internal Medicine

## 2024-03-03 VITALS — BP 138/74 | HR 60 | Ht 69.0 in | Wt 228.0 lb

## 2024-03-03 DIAGNOSIS — I2510 Atherosclerotic heart disease of native coronary artery without angina pectoris: Secondary | ICD-10-CM | POA: Diagnosis not present

## 2024-03-03 DIAGNOSIS — Z95810 Presence of automatic (implantable) cardiac defibrillator: Secondary | ICD-10-CM | POA: Diagnosis not present

## 2024-03-03 DIAGNOSIS — I5032 Chronic diastolic (congestive) heart failure: Secondary | ICD-10-CM | POA: Diagnosis not present

## 2024-03-03 DIAGNOSIS — D6869 Other thrombophilia: Secondary | ICD-10-CM | POA: Diagnosis not present

## 2024-03-03 DIAGNOSIS — E785 Hyperlipidemia, unspecified: Secondary | ICD-10-CM | POA: Diagnosis not present

## 2024-03-03 DIAGNOSIS — I4821 Permanent atrial fibrillation: Secondary | ICD-10-CM | POA: Diagnosis not present

## 2024-03-03 DIAGNOSIS — Z01812 Encounter for preprocedural laboratory examination: Secondary | ICD-10-CM | POA: Diagnosis not present

## 2024-03-03 DIAGNOSIS — I10 Essential (primary) hypertension: Secondary | ICD-10-CM | POA: Insufficient documentation

## 2024-03-03 DIAGNOSIS — I350 Nonrheumatic aortic (valve) stenosis: Secondary | ICD-10-CM | POA: Insufficient documentation

## 2024-03-03 NOTE — Progress Notes (Signed)
 Pre Surgical Assessment: 5 M Walk Test  20M=16.71ft  5 Meter Walk Test- trial 1: 4.36 seconds 5 Meter Walk Test- trial 2: 4.32 seconds 5 Meter Walk Test- trial 3: 4.41 seconds 5 Meter Walk Test Average: 4.36 seconds

## 2024-03-03 NOTE — Patient Instructions (Signed)
 Medication Instructions:  No medication changes were made at this visit. Continue current regimen.   *If you need a refill on your cardiac medications before your next appointment, please call your pharmacy*  Lab Work: To be completed today: CBC  If you have labs (blood work) drawn today and your tests are completely normal, you will receive your results only by: MyChart Message (if you have MyChart) OR A paper copy in the mail If you have any lab test that is abnormal or we need to change your treatment, we will call you to review the results.  Testing/Procedures: Your physician has requested that you have a cardiac catheterization. Cardiac catheterization is used to diagnose and/or treat various heart conditions. Doctors may recommend this procedure for a number of different reasons. The most common reason is to evaluate chest pain. Chest pain can be a symptom of coronary artery disease (CAD), and cardiac catheterization can show whether plaque is narrowing or blocking your heart's arteries. This procedure is also used to evaluate the valves, as well as measure the blood flow and oxygen  levels in different parts of your heart. For further information please visit [email or web address] Please follow instruction sheet, as given.   Follow-Up: At [facility], you and your health needs are our priority.  As part of our continuing mission to provide you with exceptional heart care, our providers are all part of one team.  This team includes your primary Cardiologist (physician) and Advanced Practice Providers or APPs (Physician Assistants and Nurse Practitioners) who all work together to provide you with the care you need, when you need it.  Your next appointment:   Per Structural Heart Team  We recommend signing up for the patient portal called MyChart.  Sign up information is provided on this After Visit Summary.  MyChart is used to connect with patients for Virtual Visits  (Telemedicine).  Patients are able to view lab/test results, encounter notes, upcoming appointments, etc.  Non-urgent messages can be sent to your provider as well.   To learn more about what you can do with MyChart, go to [email or web address].   Other Instructions       Cardiac/Peripheral Catheterization   You are scheduled for a Cardiac Catheterization on Friday, October 24 with Dr. Lurena Red.  1. Please arrive at the [facility] (Main Entrance A) at [facility]: [address] [place], [place] [address] at 8:30 AM (This time is 2 hour(s) before your procedure to ensure your preparation). Your procedure is scheduled to begin at 10:30 AM.  Free valet parking service is available. You will check in at ADMITTING. The support person will be asked to wait in the waiting room.  It is OK to have someone drop you off and come back when you are ready to be discharged.        Special note: Every effort is made to have your procedure done on time. Please understand that emergencies sometimes delay scheduled procedures.  2. Diet: Nothing to eat after midnight.  3. Hydration:You need to be well hydrated before your procedure. On October 24, you may drink approved liquids (see below) until 2 hours before the procedure, with 16 oz of water as your last intake.   List of approved liquids water, clear juice, clear tea, black coffee, fruit juices, non-citric and without pulp, carbonated beverages, Gatorade, Kool -Aid, plain Jello-O and plain ice popsicles.  4. Labs: You will need to have blood drawn on Thursday, October 16 at [facility]  [name] (1st Floor), [address], [place], [place] [address]. You do not need to be fasting.  5. Medication instructions in preparation for your procedure:   Contrast Allergy: No   Stop taking Eliquis  (Apixiban) on Wednesday, October 22. (Last dose to be taken on Tuesday 03/08/24)  Stop taking, Torsemide   (Demadex ) Thursday, October 23, (Last dose to be taken on Wednesday 03/09/24)   On the morning of your procedure, take Aspirin  81 mg and any morning medicines NOT listed above.  You may use sips of water.  6. Plan to go home the same day, you will only stay overnight if medically necessary. 7. You MUST have a responsible adult to drive you home. 8. An adult MUST be with you the first 24 hours after you arrive home. 9. Bring a current list of your medications, and the last time and date medication taken. 10. Bring ID and current insurance cards. 11.Please wear clothes that are easy to get on and off and wear slip-on shoes.  Thank you for allowing us  to care for you!   -- [facility]

## 2024-03-04 ENCOUNTER — Ambulatory Visit: Payer: Self-pay | Admitting: Internal Medicine

## 2024-03-04 DIAGNOSIS — Z23 Encounter for immunization: Secondary | ICD-10-CM | POA: Diagnosis not present

## 2024-03-04 LAB — CBC
Hematocrit: 47.9 % (ref 37.5–51.0)
Hemoglobin: 15.9 g/dL (ref 13.0–17.7)
MCH: 36.4 pg — ABNORMAL HIGH (ref 26.6–33.0)
MCHC: 33.2 g/dL (ref 31.5–35.7)
MCV: 110 fL — ABNORMAL HIGH (ref 79–97)
Platelets: 356 x10E3/uL (ref 150–450)
RBC: 4.37 x10E6/uL (ref 4.14–5.80)
RDW: 13.5 % (ref 11.6–15.4)
WBC: 18.3 x10E3/uL — ABNORMAL HIGH (ref 3.4–10.8)

## 2024-03-09 ENCOUNTER — Other Ambulatory Visit: Payer: Self-pay | Admitting: Family Medicine

## 2024-03-09 MED ORDER — METOPROLOL SUCCINATE ER 200 MG PO TB24: 200.0000 mg | ORAL_TABLET | Freq: Every day | ORAL | 0 refills | Status: DC

## 2024-03-09 NOTE — Telephone Encounter (Signed)
 Copied from CRM [id number]. Topic: Clinical - Medication Refill >> Mar 09, 2024 11:30 AM Jasmin G wrote: Medication: metoprolol  (TOPROL -XL) 200 MG 24 hr tablet  Has the patient contacted their pharmacy? Yes (Agent: If no, request that the patient contact the pharmacy for the refill. If patient does not wish to contact the pharmacy document the reason why and proceed with request.) (Agent: If yes, when and what did the pharmacy advise?)  This is the patient's preferred pharmacy:  [facility] - [place], [place] - [address] [place] [address] Phone: [phone] Fax: [phone]  Is this the correct pharmacy for this prescription? Yes If no, delete pharmacy and type the correct one.   Has the prescription been filled recently? Yes  Is the patient out of the medication? No  Has the patient been seen for an appointment in the last year OR does the patient have an upcoming appointment? Yes  Can we respond through MyChart? No  Agent: Please be advised that Rx refills may take up to 3 business days. We ask that you follow-up with your pharmacy.

## 2024-03-10 ENCOUNTER — Telehealth: Payer: Self-pay | Admitting: *Deleted

## 2024-03-10 ENCOUNTER — Other Ambulatory Visit: Payer: Self-pay

## 2024-03-10 DIAGNOSIS — I350 Nonrheumatic aortic (valve) stenosis: Secondary | ICD-10-CM

## 2024-03-10 NOTE — Telephone Encounter (Addendum)
 Cardiac Catheterization scheduled at [facility] for: Friday March 11, 2024 10:30 AM Arrival time [facility] Main Entrance A at: 8:30 AM  Diet: -Nothing to eat after midnight.  Hydration: -May drink clear liquids until 2 hours before the procedure.  Approved liquids: Water, clear tea, black coffee, fruit juices-non-citric and without pulp,Gatorade, plain Jello/popsicles.   -Please drink 16 oz of water 2 hours before procedure.  Medication instructions: -Hold:  Eliquis -none 03/09/24 until post procedure  Torsemide -AM of procedure -Other usual morning medications can be taken including aspirin  81 mg.  Plan to go home the same day, you will only stay overnight if medically necessary.  Left message for patient to call back to review procedure instructions  You must have responsible adult to drive you home.  Someone must be with you the first 24 hours after you arrive home.  Reviewed procedure instructions with patient's wife (DPR).

## 2024-03-11 ENCOUNTER — Encounter (HOSPITAL_COMMUNITY): Admission: RE | Disposition: A | Payer: Self-pay | Source: Home / Self Care | Attending: Internal Medicine

## 2024-03-11 ENCOUNTER — Ambulatory Visit (HOSPITAL_COMMUNITY)
Admission: RE | Admit: 2024-03-11 | Discharge: 2024-03-11 | Disposition: A | Discharge status: Home / Self Care | Attending: Internal Medicine | Admitting: Internal Medicine

## 2024-03-11 ENCOUNTER — Other Ambulatory Visit: Payer: Self-pay

## 2024-03-11 DIAGNOSIS — Z79899 Other long term (current) drug therapy: Secondary | ICD-10-CM | POA: Diagnosis not present

## 2024-03-11 DIAGNOSIS — Z95810 Presence of automatic (implantable) cardiac defibrillator: Secondary | ICD-10-CM | POA: Diagnosis not present

## 2024-03-11 DIAGNOSIS — D6869 Other thrombophilia: Secondary | ICD-10-CM | POA: Diagnosis not present

## 2024-03-11 DIAGNOSIS — I4821 Permanent atrial fibrillation: Secondary | ICD-10-CM | POA: Insufficient documentation

## 2024-03-11 DIAGNOSIS — I110 Hypertensive heart disease with heart failure: Secondary | ICD-10-CM | POA: Diagnosis not present

## 2024-03-11 DIAGNOSIS — I2510 Atherosclerotic heart disease of native coronary artery without angina pectoris: Secondary | ICD-10-CM | POA: Insufficient documentation

## 2024-03-11 DIAGNOSIS — Z7901 Long term (current) use of anticoagulants: Secondary | ICD-10-CM | POA: Insufficient documentation

## 2024-03-11 DIAGNOSIS — I2582 Chronic total occlusion of coronary artery: Secondary | ICD-10-CM | POA: Insufficient documentation

## 2024-03-11 DIAGNOSIS — E785 Hyperlipidemia, unspecified: Secondary | ICD-10-CM | POA: Diagnosis not present

## 2024-03-11 DIAGNOSIS — F0150 Vascular dementia without behavioral disturbance: Secondary | ICD-10-CM | POA: Diagnosis not present

## 2024-03-11 DIAGNOSIS — I5032 Chronic diastolic (congestive) heart failure: Secondary | ICD-10-CM | POA: Diagnosis not present

## 2024-03-11 DIAGNOSIS — Z951 Presence of aortocoronary bypass graft: Secondary | ICD-10-CM | POA: Diagnosis not present

## 2024-03-11 DIAGNOSIS — G309 Alzheimer's disease, unspecified: Secondary | ICD-10-CM | POA: Diagnosis not present

## 2024-03-11 DIAGNOSIS — Z87891 Personal history of nicotine dependence: Secondary | ICD-10-CM | POA: Insufficient documentation

## 2024-03-11 DIAGNOSIS — I350 Nonrheumatic aortic (valve) stenosis: Secondary | ICD-10-CM | POA: Diagnosis not present

## 2024-03-11 HISTORY — PX: CORONARY ANGIOGRAPHY: CATH118303

## 2024-03-11 SURGERY — CORONARY ANGIOGRAPHY (CATH LAB)
Anesthesia: LOCAL

## 2024-03-11 MED ORDER — FENTANYL CITRATE (PF) 100 MCG/2ML IJ SOLN
INTRAMUSCULAR | Status: DC | PRN
  Administered 2024-03-11: 25 ug via INTRAVENOUS

## 2024-03-11 MED ORDER — HEPARIN SODIUM (PORCINE) 1000 UNIT/ML IJ SOLN
INTRAMUSCULAR | Status: DC | PRN
  Administered 2024-03-11: 5000 [IU] via INTRA_ARTERIAL

## 2024-03-11 MED ORDER — HYDRALAZINE HCL 20 MG/ML IJ SOLN: 10.0000 mg | INTRAMUSCULAR | Status: DC | PRN

## 2024-03-11 MED ORDER — MIDAZOLAM HCL (PF) 2 MG/2ML IJ SOLN
INTRAMUSCULAR | Status: DC | PRN
  Administered 2024-03-11: 1 mg via INTRAVENOUS

## 2024-03-11 MED ORDER — SODIUM CHLORIDE 0.9% FLUSH: 3.0000 mL | Freq: Two times a day (BID) | INTRAVENOUS | Status: DC

## 2024-03-11 MED ORDER — SODIUM CHLORIDE 0.9% FLUSH
3.0000 mL | INTRAVENOUS | Status: DC | PRN
Start: 2024-03-11 — End: 2024-03-11

## 2024-03-11 MED ORDER — HEPARIN SODIUM (PORCINE) 1000 UNIT/ML IJ SOLN
INTRAMUSCULAR | Status: AC
  Filled 2024-03-11: qty 10

## 2024-03-11 MED ORDER — SODIUM CHLORIDE 0.9 % IV SOLN: 250.0000 mL | INTRAVENOUS | Status: DC | PRN

## 2024-03-11 MED ORDER — ASPIRIN 81 MG PO CHEW: 81.0000 mg | CHEWABLE_TABLET | ORAL | Status: DC

## 2024-03-11 MED ORDER — SODIUM CHLORIDE 0.9% FLUSH: 3.0000 mL | INTRAVENOUS | Status: DC | PRN

## 2024-03-11 MED ORDER — ACETAMINOPHEN 325 MG PO TABS: 650.0000 mg | ORAL_TABLET | ORAL | Status: DC | PRN

## 2024-03-11 MED ORDER — VERAPAMIL HCL 2.5 MG/ML IV SOLN
INTRAVENOUS | Status: AC
  Filled 2024-03-11: qty 2

## 2024-03-11 MED ORDER — IOHEXOL 350 MG/ML SOLN
INTRAVENOUS | Status: DC | PRN
  Administered 2024-03-11: 140 mL

## 2024-03-11 MED ORDER — ONDANSETRON HCL 4 MG/2ML IJ SOLN: 4.0000 mg | Freq: Four times a day (QID) | INTRAMUSCULAR | Status: DC | PRN

## 2024-03-11 MED ORDER — VERAPAMIL HCL 2.5 MG/ML IV SOLN
INTRAVENOUS | Status: DC | PRN
  Administered 2024-03-11: 5 mg via INTRA_ARTERIAL

## 2024-03-11 MED ORDER — LIDOCAINE HCL (PF) 1 % IJ SOLN
INTRAMUSCULAR | Status: DC | PRN
  Administered 2024-03-11: 2 mL via INTRADERMAL

## 2024-03-11 MED ORDER — FREE WATER
500.0000 mL | Freq: Once | Status: AC
  Administered 2024-03-11: 500 mL via ORAL

## 2024-03-11 MED ORDER — FENTANYL CITRATE (PF) 100 MCG/2ML IJ SOLN
INTRAMUSCULAR | Status: AC
  Filled 2024-03-11: qty 2

## 2024-03-11 MED ORDER — LABETALOL HCL 5 MG/ML IV SOLN: 10.0000 mg | INTRAVENOUS | Status: DC | PRN

## 2024-03-11 MED ORDER — FREE WATER: 500.0000 mL | Freq: Once | Status: DC

## 2024-03-11 MED ORDER — MIDAZOLAM HCL 2 MG/2ML IJ SOLN
INTRAMUSCULAR | Status: AC
  Filled 2024-03-11: qty 2

## 2024-03-11 MED ORDER — HEPARIN (PORCINE) IN NACL 1000-0.9 UT/500ML-% IV SOLN
INTRAVENOUS | Status: DC | PRN
  Administered 2024-03-11 (×2): 500 mL

## 2024-03-11 SURGICAL SUPPLY — 9 items
CATH INFINITI 6F AL1 (CATHETERS) IMPLANT
CATH INFINITI 6F ANG MULTIPACK (CATHETERS) IMPLANT
DEVICE RAD COMP TR BAND LRG (VASCULAR PRODUCTS) IMPLANT
GLIDESHEATH SLEND SS 6F .021 (SHEATH) IMPLANT
GUIDEWIRE ANGLED .035X150CM (WIRE) IMPLANT
PACK CARDIAC CATHETERIZATION (CUSTOM PROCEDURE TRAY) ×1 IMPLANT
SET ATX-X65L (MISCELLANEOUS) IMPLANT
SHEATH GLIDE SLENDER 4/5FR (SHEATH) IMPLANT
WIRE EMERALD 3MM-J .035X260CM (WIRE) IMPLANT

## 2024-03-11 NOTE — Interval H&P Note (Signed)
 History and Physical Interval Note:  03/11/2024 9:04 AM  [name] LABOR [name]  has presented today for surgery, with the diagnosis of aortic stenosis.  The various methods of treatment have been discussed with the patient and family. After consideration of risks, benefits and other options for treatment, the patient has consented to  Procedure(s): RIGHT/LEFT HEART CATH AND CORONARY/GRAFT ANGIOGRAPHY (N/A) as a surgical intervention.  The patient's history has been reviewed, patient examined, no change in status, stable for surgery.  I have reviewed the patient's chart and labs.  Questions were answered to the patient's satisfaction.     [name] K Amel Kitch

## 2024-03-11 NOTE — Discharge Instructions (Addendum)
 Restart Eliquis  starting tomorrow March 12, 2024  DRINK PLENTY OF FLUIDS OVER THE NEXT 2-3 DAYS.

## 2024-03-12 ENCOUNTER — Encounter (HOSPITAL_COMMUNITY): Payer: Self-pay | Admitting: Internal Medicine

## 2024-03-15 ENCOUNTER — Encounter: Payer: Self-pay | Admitting: Cardiology

## 2024-03-15 ENCOUNTER — Ambulatory Visit: Attending: Cardiology | Admitting: Cardiology

## 2024-03-15 VITALS — BP 124/70 | HR 64 | Ht 69.0 in | Wt 234.4 lb

## 2024-03-15 DIAGNOSIS — I4821 Permanent atrial fibrillation: Secondary | ICD-10-CM | POA: Diagnosis not present

## 2024-03-15 DIAGNOSIS — I10 Essential (primary) hypertension: Secondary | ICD-10-CM | POA: Insufficient documentation

## 2024-03-15 DIAGNOSIS — I2510 Atherosclerotic heart disease of native coronary artery without angina pectoris: Secondary | ICD-10-CM | POA: Diagnosis not present

## 2024-03-15 DIAGNOSIS — I350 Nonrheumatic aortic (valve) stenosis: Secondary | ICD-10-CM | POA: Insufficient documentation

## 2024-03-15 DIAGNOSIS — I5032 Chronic diastolic (congestive) heart failure: Secondary | ICD-10-CM | POA: Insufficient documentation

## 2024-03-15 DIAGNOSIS — E785 Hyperlipidemia, unspecified: Secondary | ICD-10-CM | POA: Diagnosis not present

## 2024-03-15 NOTE — Patient Instructions (Signed)
 Medication Instructions:  Your physician recommends that you continue on your current medications as directed. Please refer to the Current Medication list given to you today.  *If you need a refill on your cardiac medications before your next appointment, please call your pharmacy*  Lab Work: TODAY: BMP If you have labs (blood work) drawn today and your tests are completely normal, you will receive your results only by: MyChart Message (if you have MyChart) OR A paper copy in the mail If you have any lab test that is abnormal or we need to change your treatment, we will call you to review the results.  Follow-Up: Keep as scheduled.  We recommend signing up for the patient portal called MyChart.  Sign up information is provided on this After Visit Summary.  MyChart is used to connect with patients for Virtual Visits (Telemedicine).  Patients are able to view lab/test results, encounter notes, upcoming appointments, etc.  Non-urgent messages can be sent to your provider as well.    To learn more about what you can do with MyChart, go to [email or web address].

## 2024-03-16 ENCOUNTER — Ambulatory Visit: Payer: Self-pay | Admitting: Cardiology

## 2024-03-16 LAB — BASIC METABOLIC PANEL WITH GFR
BUN/Creatinine Ratio: 13 (ref 10–24)
BUN: 13 mg/dL (ref 8–27)
CO2: 28 mmol/L (ref 20–29)
Calcium: 9.2 mg/dL (ref 8.6–10.2)
Chloride: 98 mmol/L (ref 96–106)
Creatinine, Ser: 0.99 mg/dL (ref 0.76–1.27)
Glucose: 141 mg/dL — ABNORMAL HIGH (ref 70–99)
Potassium: 4.3 mmol/L (ref 3.5–5.2)
Sodium: 143 mmol/L (ref 134–144)
eGFR: 76 mL/min/1.73 (ref >59)

## 2024-03-23 ENCOUNTER — Ambulatory Visit (HOSPITAL_COMMUNITY)

## 2024-03-29 ENCOUNTER — Telehealth: Payer: Self-pay

## 2024-03-29 NOTE — Telephone Encounter (Signed)
 Left a detailed voicemail for the patient's daughter to return a call back to clinic regarding their request for the med refill for metoprolol . Per the patient's chart, a refill was sent on 03/09/24 to [facility] mail order pharmacy, [id number]. Direct call back info provided.

## 2024-03-29 NOTE — Telephone Encounter (Signed)
 Copied from CRM [id number]. Topic: Clinical - Medication Question >> Dec 08, 2023 10:11 AM Aleatha C wrote: Reason for CRM: Patient needs his metoprolol  (TOPROL -XL) 200 MG 24 hr tablet and  it was prescribe from another doctor and you would like Nurse Sharee to give a call back [phone] and on last pill today

## 2024-03-30 ENCOUNTER — Ambulatory Visit: Admitting: Surgery

## 2024-03-31 ENCOUNTER — Ambulatory Visit: Attending: Cardiology

## 2024-03-31 DIAGNOSIS — I5032 Chronic diastolic (congestive) heart failure: Secondary | ICD-10-CM

## 2024-03-31 NOTE — Progress Notes (Unsigned)
 Patient ID: [name] [name] MRN: [id number] DOB/AGE: 1940-11-11 83 y.o.  Primary Care Physician:Rucker, Torrence GRADE, MD Primary Cardiologist: Pietro  CC:  Aortic valvular disease management     FOCUSED PROBLEM LIST:   Aortic stenosis AVA 0.94, SVI 37, DI 0.3 MG 36, V-max 3.8, EF 50 to 55% TTE May 2025 EKG: V pacing CAD CABG LIMA to LAD, vein graft to OM, vein graft to diagonal 2015 Chronic diastolic heart failure  Defers Jardiance   Hypertension  Hyperlipidemia  Permanent atrial fibrillation Status post BiV ICD Early onset dementia Alzheimer's and vascular dementia BMI 37/BSA 2.19 February 2024:  Patient consents to use of AI scribe. The patient is an 83 year old male with the above listed medical problems referred for recommendations regarding his aortic stenosis.  He was seen by Dr. Court recently was found to be volume overloaded.  His torsemide  was increased.  He has been experiencing leg swelling, for which his torsemide  dose was recently doubled, resulting in a weight reduction from 252 to 235 pounds. An ultrasound of his heart showed an abnormality with one of his heart valves, which led to this consultation.  He experiences regular shortness of breath, which worsens when lying flat, leading him to sleep in a recliner for the past six months. He denies recent chest discomfort, lightheadedness, or near blackouts. His physical activity is limited to walking around the house and occasional trips to the grocery store, where he moves slowly but manages to navigate the aisles.  He is on a blood thinner for atrial fibrillation and has not experienced any significant bleeding issues, although he does bruise easily.  He has early onset dementia, as diagnosed by Dr. Ines, and experiences some forgetfulness, but remains oriented and functional in daily activities. His wife notes that he occasionally forgets details of TV series but recognizes familiar faces.  No dental issues.   Plan refer for evaluation for aortic valve intervention.  November 2025:  Patient consents to use of AI scribe. The patient underwent coronary and bypass angiography demonstrating occluded vein grafts and a patent LIMA to LAD.  More notably the patient was very forgetful and did not recognize me on the day of the procedure.  He is here to discuss this issue further.        Past Medical History:  Diagnosis Date   A-fib (HCC) 01/2014   AICD (automatic cardioverter/defibrillator) present    [employer]   Anal fissure 11/10/2019   Arthritis    ASHD (arteriosclerotic heart disease) 2015   s/p CABG   Cataract    s/p left   CHF (congestive heart failure) (HCC)    COPD (chronic obstructive pulmonary disease) (HCC)    by CXR, pt unsure of this   Diverticulosis    GERD (gastroesophageal reflux disease)    Gout 2014   Hyperlipidemia    Hypertension    Hypothyroidism    LBBB (left bundle branch block)    Polycythemia vera (HCC)    Prediabetes 01/2014   Presence of permanent cardiac pacemaker    Sleep apnea    no CPAP    Past Surgical History:  Procedure Laterality Date   BIV ICD GENERATOR CHANGEOUT N/A 01/14/2024   Procedure: BIV ICD GENERATOR CHANGEOUT;  Surgeon: Inocencio Soyla Lunger, MD;  Location: [facility] INVASIVE CV LAB;  Service: Cardiovascular;  Laterality: N/A;   CARDIAC DEFIBRILLATOR PLACEMENT  07/2014   CATARACT EXTRACTION W/ INTRAOCULAR LENS IMPLANT Left    COLONOSCOPY  08/26/2022   normal  CORONARY ANGIOGRAPHY N/A 03/11/2024   Procedure: CORONARY ANGIOGRAPHY;  Surgeon: Wendel Lurena POUR, MD;  Location: [facility] INVASIVE CV LAB;  Service: Cardiovascular;  Laterality: N/A;   CORONARY ARTERY BYPASS GRAFT  11/2013   ESOPHAGOGASTRODUODENOSCOPY  08/26/2022   esophageal dilation   lumb laminectomy  [phone]   x 3   LUMBAR FUSION  2013   NASAL SEPTOPLASTY W/ TURBINOPLASTY Bilateral 01/30/2016   Procedure: NASAL SEPTOPLASTY WITH BILATERAL TURBINATE REDUCTION;  Surgeon:  Lonni FORBES Angle, MD;  Location: [facility] OR;  Service: ENT;  Laterality: Bilateral;   TONSILLECTOMY     UVULECTOMY N/A 01/30/2016   Procedure: OPHELIA;  Surgeon: Lonni FORBES Angle, MD;  Location: [facility] OR;  Service: ENT;  Laterality: N/A;    Family History  Problem Relation Age of Onset   Alzheimer's disease Mother    Mental illness Mother    Depression Mother    Heart disease Father 71       had 5 MI's & died 5 yo   Hypertension Father    Alcohol abuse Son    Heart attack Son    Stroke Maternal Grandmother    Dementia Maternal Grandmother    Cancer - Prostate Other    Colon cancer Neg Hx    Stomach cancer Neg Hx    Esophageal cancer Neg Hx     Social History   Socioeconomic History   Marital status: Married    Spouse name: [name]    Number of children: 2   Years of education: 13   Highest education level: Not on file  Occupational History   Occupation: Retired  Tobacco Use   Smoking status: Former    Current packs/day: 0.00    Average packs/day: 2.0 packs/day for 25.0 years (50.0 ttl pk-yrs)    Types: Cigarettes    Start date: 05/19/1958    Quit date: 05/20/1983    Years since quitting: 40.8   Smokeless tobacco: Never  Vaping Use   Vaping status: Never Used  Substance and Sexual Activity   Alcohol use: Never   Drug use: Never   Sexual activity: Not Currently  Other Topics Concern   Not on file  Social History Narrative   Lives with wife, [name]   Caffeine use: daily (Coffee) decaf   Social Drivers of Health   Financial Resource Strain: Unknown (07/28/2019)   [facility] from [facility] Strain    5. Do you ever have problems being able to afford everything you need?: Not on file    4. Over the last 6 months, have you had trouble paying your heating or electricity bill?: Not on file  Food Insecurity: No Food Insecurity (06/05/2021)   Received from [facility]   Hunger Vital Sign    Within the past 12 months, you worried that your food  would run out before you got the money to buy more.: Never true    Within the past 12 months, the food you bought just didn't last and you didn't have money to get more.: Never true  Transportation Needs: No Transportation Needs (03/29/2021)   PRAPARE - Administrator, Civil Service (Medical): No    Lack of Transportation (Non-Medical): No  Physical Activity: Not on file  Stress: Not on file  Social Connections: Unknown (07/28/2019)   Received from [facility]   Social Connections    11. How often do you see or talk to people that you care about and feel close to? (  Talking to friends on the phone or virtually, visiting friends/family, religious service, clubs): Not on file  Intimate Partner Violence: Unknown (10/11/2019)   Received from [facility]   Intimate Partner Violence     Prior to Admission medications   Medication Sig Start Date End Date Taking? Authorizing Provider  acetaminophen  (TYLENOL ) 500 MG tablet Take 1,000 mg by mouth in the morning.    [provider]  allopurinol  (ZYLOPRIM ) 300 MG tablet TAKE 1 TABLET EVERY DAY TO PREVENT GOUT 05/11/23   Colette Torrence GRADE, MD  aspirin  EC 81 MG tablet Take 81 mg by mouth daily.    [provider]  Cholecalciferol (VITAMIN D -3) 125 MCG (5000 UT) TABS Take 5,000 Units by mouth daily.    [provider]  Choline Dihydrogen Citrate (CHOLINE CITRATE PO) Take 1 tablet by mouth at bedtime.    [provider]  Cyanocobalamin  (B-12) 1000 MCG CAPS Take 5,000 mg by mouth daily. 2500  mg each    [provider]  diphenhydrAMINE  (BENADRYL ) 25 mg capsule Take 25 mg by mouth at bedtime.    [provider]  docusate sodium  (COLACE) 100 MG capsule Take 100 mg by mouth daily with lunch.    [provider]  donepezil  (ARICEPT ) 10 MG tablet Take 1 tablet (10 mg total) by mouth at bedtime. 01/04/24   Sherryl Bouchard, NP  ELIQUIS  5 MG TABS tablet TAKE 1 TABLET TWICE  DAILY 05/29/23   Colette Torrence GRADE, MD  ezetimibe  (ZETIA ) 10 MG tablet TAKE 1 TABLET EVERY DAY 07/22/23   Colette Torrence GRADE, MD  finasteride  (PROSCAR ) 5 MG tablet TAKE 1 TABLET EVERY DAY 05/15/23   Rucker, Alethea Y, MD  Ginger, Zingiber officinalis, (GINGER ROOT) 550 MG CAPS Take 550 mg by mouth 2 (two) times daily before a meal.    [provider]  hydroxyurea  (HYDREA ) 500 MG capsule TAKE 2 CAPSULES ONE TIME DAILY WITH FOOD 07/21/23   Onesimo Emaline Brink, MD  ipratropium-albuterol  (DUONEB) 0.5-2.5 (3) MG/3ML SOLN Take 3 mLs by nebulization every 6 (six) hours as needed. 02/20/24   Ragan, Michael, FNP  isosorbide  mononitrate (IMDUR ) 30 MG 24 hr tablet TAKE 1 TABLET EVERY DAY 05/15/23   Rucker, Torrence GRADE, MD  levothyroxine  (SYNTHROID ) 200 MCG tablet TAKE 1 TAB DAILY ON EMPTY STOMACH WITH ONLY WATER FOR 30 MINS. NO ANTACIDS, CALCIUM  OR MAGNESIUM  FOR 4 HRS. AVOID BIOTIN. 11/19/23   Colette Torrence GRADE, MD  magnesium  oxide (MAG-OX) 400 MG tablet Take 400 mg by mouth at bedtime.    [provider]  Melatonin 10 MG TABS Take 10 mg by mouth at bedtime.    [provider]  memantine  (NAMENDA ) 10 MG tablet Take 1 tablet (10 mg total) by mouth 2 (two) times daily. 01/04/24   Millikan, Megan, NP  metoprolol  (TOPROL -XL) 200 MG 24 hr tablet Take 1 tablet (200 mg total) by mouth daily. 12/09/23   Colette Torrence GRADE, MD  Multiple Vitamin (MULTIVITAMIN) tablet Take 1 tablet by mouth daily.    [provider]  mupirocin  ointment (BACTROBAN ) 2 % Apply 1 Application topically daily. 02/01/24   Rucker, Torrence GRADE, MD  omeprazole  (PRILOSEC) 20 MG capsule TAKE 1 CAPSULE TWICE DAILY  ( EVERY 12 HOURS  ) TO PREVENT HEARTBURN AND ACID REFLUX 09/23/23   Colette Torrence GRADE, MD  POLYETHYLENE GLYCOL 3350  PO Take 1 Capful by mouth at bedtime.    [provider]  predniSONE  (STERAPRED UNI-PAK 21 TAB) 10 MG (21)  TBPK tablet Take by mouth daily. Take 6 tabs by mouth daily  for 2 days, then 5 tabs for 2  days, then 4 tabs for 2 days, then 3 tabs for 2 days, 2 tabs for 2 days, then 1 tab by mouth daily for 2 days 02/20/24   Teddy Sharper, FNP  Probiotic Product (PROBIOTIC-10 PO) Take 1 tablet by mouth daily.    [provider]  ramelteon  (ROZEREM ) 8 MG tablet TAKE 1 TABLET AT BEDTIME 12/07/23   Rucker, Torrence GRADE, MD  rosuvastatin  (CRESTOR ) 40 MG tablet TAKE 1 TABLET BY MOUTH DAILY. 05/15/23   Colette Torrence GRADE, MD  tamsulosin  (FLOMAX ) 0.4 MG CAPS capsule TAKE 1 CAPSULE AT BEDTIME FOR PROSTATE AND BLADDER 05/15/23   Colette Torrence GRADE, MD  torsemide  (DEMADEX ) 20 MG tablet Take 1 tablet (20mg ) twice daily until goal weight of 235lbs is achieved then take 1 tablet (20mg ) once daily. 02/23/24   Court Dorn PARAS, MD  Turmeric 500 MG TABS Take 500 mg by mouth daily.    [provider]    Allergies  Allergen Reactions   Other Swelling    SODIUM SENSITIVE    REVIEW OF SYSTEMS:  General: no fevers/chills/night sweats Eyes: no blurry vision, diplopia, or amaurosis ENT: no sore throat or hearing loss Resp: no cough, wheezing, or hemoptysis CV: no edema or palpitations GI: no abdominal pain, nausea, vomiting, diarrhea, or constipation GU: no dysuria, frequency, or hematuria Skin: no rash Neuro: no headache, numbness, tingling, or weakness of extremities Musculoskeletal: no joint pain or swelling Heme: no bleeding, DVT, or easy bruising Endo: no polydipsia or polyuria  There were no vitals taken for this visit.  PHYSICAL EXAM: GEN:  AO x 3 in no acute distress HEENT: normal Dentition: Dentures Neck: JVP normal. +2 carotid upstrokes without bruits. No thyromegaly. Lungs: equal expansion, clear bilaterally CV: Apex is discrete and nondisplaced, RRR with 3/6 SEM Abd: soft, non-tender, non-distended; no bruit; positive bowel sounds Ext: no edema, ecchymoses, or cyanosis Vascular: 2+ femoral pulses, 2+ radial pulses       Skin: warm and dry without rash Neuro: CN II-XII grossly  intact; motor and sensory grossly intact    DATA AND STUDIES:  EKG: 2000 25V paced rhythm with occasional PVCs  EKG Interpretation Date/Time:    Ventricular Rate:    PR Interval:    QRS Duration:    QT Interval:    QTC Calculation:   R Axis:      Text Interpretation:          CARDIAC STUDIES: Refer to CV Procedures and Imaging Tabs  06/11/2023: TSH 3.020 11/17/2023: ALT 14 02/23/2024: BNP 511.1 03/03/2024: Hemoglobin 15.9; Platelets 356 03/15/2024: BUN 13; Creatinine, Ser 0.99; Potassium 4.3; Sodium 143   STS RISK CALCULATOR: Pending  NYHA CLASS: 2    ASSESSMENT AND PLAN:   1. Nonrheumatic aortic valve stenosis   2. Coronary artery disease involving native coronary artery of native heart without angina pectoris   3. Chronic diastolic CHF (congestive heart failure) (HCC)   4. Essential hypertension   5. Hyperlipidemia LDL goal <70   6. Permanent atrial fibrillation (HCC)   7. Secondary hypercoagulable state   8. Biventricular ICD (implantable cardioverter-defibrillator) in place      Aortic stenosis: The patient's dementia is quite dense and progressive.  We have had patients who have dementia has actually worsened after a rapid pacing run for TAVR.  Unfortunately I do not think that this patient is a candidate  for transcatheter valve replacement because of the severity significant and severe issue.  I had a long conversation with the patient and his wife.  We have elected to pursue palliative care.  I will refer him to palliative care services.  Patient will follow-up with his primary cardiologist Dr. Pietro. Coronary artery disease: Continue Eliquis  5 mg twice daily, discontinue aspirin  continue rosuvastatin  40 mg Chronic diastolic heart failure: Continue Toprol  20 mg daily, torsemide  20 mg daily, patient defers SGLT2 inhibitor Hypertension: Continue Toprol  200 mg daily.  BP at goal. Hyperlipidemia: Continue rosuvastatin  40 mg, Zetia  10 mg Permanent atrial  fibrillation: Continue Eliquis  5 mg twice daily, Toprol  200 mg daily Secondary hypercoagulable state: Continue Eliquis  5 mg twice daily BiV ICD: Followed by EP   I have personally reviewed the patients imaging data as summarized above.  I have reviewed the natural history of aortic stenosis with the patient and family members who are present today. We have discussed the limitations of medical therapy and the poor prognosis associated with symptomatic aortic stenosis. We have also reviewed potential treatment options, including palliative medical therapy, conventional surgical aortic valve replacement, and transcatheter aortic valve replacement. We discussed treatment options in the context of this patient's specific comorbid medical conditions.   All of the patient's questions were answered today. Will make further recommendations based on the results of studies outlined above.   I spent 35 minutes reviewing all clinical data during and prior to this visit including all relevant imaging studies, laboratories, clinical information from other health systems and prior notes from both Cardiology and other specialties, interviewing the patient, conducting a complete physical examination, and coordinating care in order to formulate a comprehensive and personalized evaluation and treatment plan.   Keiera Strathman K Brodee Mauritz, MD  03/31/2024 7:32 AM    [facility] [address] [place], [place], [place]  [address] Phone: [phone]; Fax: [phone]

## 2024-04-01 ENCOUNTER — Other Ambulatory Visit: Payer: Self-pay

## 2024-04-01 ENCOUNTER — Ambulatory Visit: Attending: Internal Medicine | Admitting: Internal Medicine

## 2024-04-01 VITALS — BP 116/69 | HR 64 | Ht 69.0 in | Wt 239.1 lb

## 2024-04-01 DIAGNOSIS — I4821 Permanent atrial fibrillation: Secondary | ICD-10-CM | POA: Diagnosis present

## 2024-04-01 DIAGNOSIS — D6869 Other thrombophilia: Secondary | ICD-10-CM | POA: Diagnosis present

## 2024-04-01 DIAGNOSIS — D45 Polycythemia vera: Secondary | ICD-10-CM

## 2024-04-01 DIAGNOSIS — E785 Hyperlipidemia, unspecified: Secondary | ICD-10-CM | POA: Diagnosis not present

## 2024-04-01 DIAGNOSIS — Z95810 Presence of automatic (implantable) cardiac defibrillator: Secondary | ICD-10-CM | POA: Diagnosis not present

## 2024-04-01 DIAGNOSIS — I350 Nonrheumatic aortic (valve) stenosis: Secondary | ICD-10-CM | POA: Diagnosis not present

## 2024-04-01 DIAGNOSIS — I5032 Chronic diastolic (congestive) heart failure: Secondary | ICD-10-CM | POA: Diagnosis not present

## 2024-04-01 DIAGNOSIS — C9110 Chronic lymphocytic leukemia of B-cell type not having achieved remission: Secondary | ICD-10-CM

## 2024-04-01 DIAGNOSIS — I2510 Atherosclerotic heart disease of native coronary artery without angina pectoris: Secondary | ICD-10-CM | POA: Diagnosis not present

## 2024-04-01 DIAGNOSIS — I10 Essential (primary) hypertension: Secondary | ICD-10-CM | POA: Insufficient documentation

## 2024-04-01 LAB — CUP PACEART REMOTE DEVICE CHECK
Battery Remaining Longevity: 108 mo
Battery Remaining Percentage: 100 %
Brady Statistic RA Percent Paced: 0 %
Brady Statistic RV Percent Paced: 71 %
Date Time Interrogation Session: 20251113014000
HighPow Impedance: 59 Ohm
Implantable Lead Connection Status: 753985
Implantable Lead Connection Status: 753985
Implantable Lead Connection Status: 753985
Implantable Lead Implant Date: 20160311
Implantable Lead Implant Date: 20160311
Implantable Lead Implant Date: 20160311
Implantable Lead Location: 753858
Implantable Lead Location: 753859
Implantable Lead Location: 753860
Implantable Lead Model: 293
Implantable Lead Model: 4674
Implantable Lead Model: 5076
Implantable Lead Serial Number: 343289
Implantable Lead Serial Number: 508939
Implantable Pulse Generator Implant Date: 20250828
Lead Channel Impedance Value: 398 Ohm
Lead Channel Impedance Value: 516 Ohm
Lead Channel Impedance Value: 633 Ohm
Lead Channel Setting Pacing Amplitude: 1.6 V
Lead Channel Setting Pacing Amplitude: 2.2 V
Lead Channel Setting Pacing Pulse Width: 0.4 ms
Lead Channel Setting Pacing Pulse Width: 0.6 ms
Lead Channel Setting Sensing Sensitivity: 0.4 mV
Lead Channel Setting Sensing Sensitivity: 1 mV
Pulse Gen Serial Number: 334229
Zone Setting Status: 755011

## 2024-04-01 NOTE — Patient Instructions (Addendum)
 Medication Instructions:  Your physician recommends that you continue on your current medications as directed. Please refer to the Current Medication list given to you today.  *If you need a refill on your cardiac medications before your next appointment, please call your pharmacy*  Lab Work: None ordered.  You may go to any [facility] Location for your lab work:  [facility] - [address] ([facility]) - [address] [phone] [address]  [place] - [address]   [place]  - [address]   [place] - [address] - [address] [employer]  - [address] - [address] ([facility]   If you have labs (blood work) drawn today and your tests are completely normal, you will receive your results only by: [employer] (if you have MyChart)  If you have any lab test that is abnormal or we need to change your treatment, we will call you or send a MyChart message to review the results.  Testing/Procedures: None ordered.  Follow-Up: At [facility], you and your health needs are our priority.  As part of our continuing mission to provide you with exceptional heart care, we have created designated Provider Care Teams.  These Care Teams include your primary Cardiologist (physician) and Advanced Practice Providers (APPs -  Physician Assistants and Nurse Practitioners) who all work together to provide you with the care you need, when you need it.   You have been referred to [facility]. They will call to set up a appointment with you.

## 2024-04-03 ENCOUNTER — Other Ambulatory Visit: Payer: Self-pay | Admitting: Family Medicine

## 2024-04-04 ENCOUNTER — Ambulatory Visit
Admission: EM | Admit: 2024-04-04 | Discharge: 2024-04-04 | Disposition: A | Discharge status: Home / Self Care | Attending: Family Medicine | Admitting: Family Medicine

## 2024-04-04 ENCOUNTER — Other Ambulatory Visit: Payer: Self-pay

## 2024-04-04 ENCOUNTER — Other Ambulatory Visit: Payer: Self-pay | Admitting: Family Medicine

## 2024-04-04 ENCOUNTER — Inpatient Hospital Stay: Attending: Hematology

## 2024-04-04 ENCOUNTER — Inpatient Hospital Stay (HOSPITAL_BASED_OUTPATIENT_CLINIC_OR_DEPARTMENT_OTHER): Admitting: Hematology

## 2024-04-04 ENCOUNTER — Encounter

## 2024-04-04 VITALS — BP 127/67 | HR 60 | Temp 97.7°F | Resp 18 | Wt 238.9 lb

## 2024-04-04 DIAGNOSIS — L89029 Pressure ulcer of left elbow, unspecified stage: Secondary | ICD-10-CM

## 2024-04-04 DIAGNOSIS — D72829 Elevated white blood cell count, unspecified: Secondary | ICD-10-CM | POA: Diagnosis not present

## 2024-04-04 DIAGNOSIS — C61 Malignant neoplasm of prostate: Secondary | ICD-10-CM | POA: Diagnosis not present

## 2024-04-04 DIAGNOSIS — D45 Polycythemia vera: Secondary | ICD-10-CM

## 2024-04-04 DIAGNOSIS — Z5189 Encounter for other specified aftercare: Secondary | ICD-10-CM | POA: Diagnosis not present

## 2024-04-04 DIAGNOSIS — Z79899 Other long term (current) drug therapy: Secondary | ICD-10-CM | POA: Diagnosis not present

## 2024-04-04 DIAGNOSIS — C9110 Chronic lymphocytic leukemia of B-cell type not having achieved remission: Secondary | ICD-10-CM

## 2024-04-04 LAB — CBC WITH DIFFERENTIAL (CANCER CENTER ONLY)
Abs Immature Granulocytes: 0.23 K/uL — ABNORMAL HIGH (ref 0.00–0.07)
Basophils Absolute: 0.2 K/uL — ABNORMAL HIGH (ref 0.0–0.1)
Basophils Relative: 1 %
Eosinophils Absolute: 0.1 K/uL (ref 0.0–0.5)
Eosinophils Relative: 1 %
HCT: 45.5 % (ref 39.0–52.0)
Hemoglobin: 14.8 g/dL (ref 13.0–17.0)
Immature Granulocytes: 1 %
Lymphocytes Relative: 7 %
Lymphs Abs: 1.3 K/uL (ref 0.7–4.0)
MCH: 36.4 pg — ABNORMAL HIGH (ref 26.0–34.0)
MCHC: 32.5 g/dL (ref 30.0–36.0)
MCV: 111.8 fL — ABNORMAL HIGH (ref 80.0–100.0)
Monocytes Absolute: 0.5 K/uL (ref 0.1–1.0)
Monocytes Relative: 3 %
Neutro Abs: 16.8 K/uL — ABNORMAL HIGH (ref 1.7–7.7)
Neutrophils Relative %: 87 %
Platelet Count: 452 K/uL — ABNORMAL HIGH (ref 150–400)
RBC: 4.07 MIL/uL — ABNORMAL LOW (ref 4.22–5.81)
RDW: 15.8 % — ABNORMAL HIGH (ref 11.5–15.5)
WBC Count: 19.1 K/uL — ABNORMAL HIGH (ref 4.0–10.5)
nRBC: 0 % (ref 0.0–0.2)

## 2024-04-04 LAB — CMP (CANCER CENTER ONLY)
ALT: 15 U/L (ref 0–44)
AST: 21 U/L (ref 15–41)
Albumin: 4.2 g/dL (ref 3.5–5.0)
Alkaline Phosphatase: 75 U/L (ref 38–126)
Anion gap: 6 (ref 5–15)
BUN: 17 mg/dL (ref 8–23)
CO2: 34 mmol/L — ABNORMAL HIGH (ref 22–32)
Calcium: 9.2 mg/dL (ref 8.9–10.3)
Chloride: 102 mmol/L (ref 98–111)
Creatinine: 1.04 mg/dL (ref 0.61–1.24)
GFR, Estimated: >60 mL/min (ref >60)
Glucose, Bld: 148 mg/dL — ABNORMAL HIGH (ref 70–99)
Potassium: 3.7 mmol/L (ref 3.5–5.1)
Sodium: 142 mmol/L (ref 135–145)
Total Bilirubin: 1.5 mg/dL — ABNORMAL HIGH (ref 0.0–1.2)
Total Protein: 6.9 g/dL (ref 6.5–8.1)

## 2024-04-04 NOTE — ED Triage Notes (Signed)
 Pt c/o pain to open area on left elbow x 1 1/2 weeks. Pt denies current pain to area.

## 2024-04-04 NOTE — Telephone Encounter (Unsigned)
 Copied from CRM #[id number]. Topic: Referral - Question >> Apr 01, 2024  2:28 PM Gustabo D wrote: [facility]- Received a referral for palliative care and they will be following the pt for palliative care  Call back [phone]

## 2024-04-04 NOTE — ED Provider Notes (Signed)
 [name]    CSN: [id number] Arrival date & time: 04/04/24  1226      History   Chief Complaint Chief Complaint  Patient presents with   Wound Check    HPI [name] [name] is a 83 y.o. male.   Wife just noticed a sore on the left elbow.  He has been complaining his elbow hurt for a couple of weeks.  She says that he rests on that elbow a lot when he is in the chair.  She thinks it is a pressure ulcer.  Is here for management. Patient has end-stage heart disease.  Chronic lymphocytic leukemia.  Chronic anticoagulation.    Past Medical History:  Diagnosis Date   A-fib (HCC) 01/2014   AICD (automatic cardioverter/defibrillator) present    [employer]   Anal fissure 11/10/2019   Arthritis    ASHD (arteriosclerotic heart disease) 2015   s/p CABG   Cataract    s/p left   CHF (congestive heart failure) (HCC)    COPD (chronic obstructive pulmonary disease) (HCC)    by CXR, pt unsure of this   Diverticulosis    GERD (gastroesophageal reflux disease)    Gout 2014   Hyperlipidemia    Hypertension    Hypothyroidism    LBBB (left bundle branch block)    Polycythemia vera (HCC)    Prediabetes 01/2014   Presence of permanent cardiac pacemaker    Sleep apnea    no CPAP    Patient Active Problem List   Diagnosis Date Noted   Severe aortic stenosis 02/23/2024   Acute non-recurrent maxillary sinusitis 01/14/2023   Moderate late onset Alzheimer's dementia (HCC) 12/15/2022   Hypotension 12/15/2022   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) 12/15/2022   Acute CVA (cerebrovascular accident) (HCC) 09/16/2022   Chronic diastolic CHF (congestive heart failure) (HCC) 09/16/2022   Encounter for screening colonoscopy 08/26/2022   Chronic ulcer of left foot with fat layer exposed (HCC) 07/10/2022   Zenker's diverticulum 10/16/2021   Loss of appetite 10/10/2021   Loss of weight 10/10/2021   Generalized abdominal pain 10/10/2021   Thrombosed  external hemorrhoid 10/10/2021   Dysphagia 08/01/2021   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   Intolerance of continuous positive airway pressure (CPAP) ventilation 05/02/2021   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   JAK2 V617F mutation 08/27/2020   Rectal pain 11/10/2019   Constipation 11/10/2019   Abnormal glucose 10/12/2019   Memory changes 01/10/2019   Polycythemia vera (HCC) 10/21/2018   Thrombocytosis 12/29/2017   Senile purpura 09/01/2017   Emphysema of lung (HCC) 06/01/2017   Tortuous aorta (HCC)- CXR 2018 06/01/2017   Regular astigmatism, right eye 10/16/2016   Combined forms of age-related cataract of right eye 10/16/2016   Trigger ring finger of left hand 08/06/2016   Bilateral carpal tunnel syndrome 08/06/2016   Ischemic cardiomyopathy 06/22/2015   ASHD/CABG x 3V (11/2013) 02/06/2015   Acquired thrombophilia (HCC) 02/06/2015   Cardiac pacemaker/AICD (01/2014) 02/06/2015   Morbid obesity (HCC) 01/03/2015   HTN (hypertension) 01/02/2015   Hyperlipidemia 01/02/2015   Vitamin D  deficiency 01/02/2015   GERD  01/02/2015   BPH (benign prostatic hyperplasia) 01/02/2015   Medication management 01/02/2015   Atrial fibrillation (HCC) 01/02/2015   Hypothyroidism 01/02/2015   OSA and COPD overlap syndrome (HCC) 01/02/2015   Chronic systolic (congestive) heart failure (HCC) 02/17/2014   S/P CABG x 3 02/13/2014   Atherosclerotic heart disease of native coronary artery without angina pectoris 02/08/2014  Gout 02/08/2014   Cardiomyopathy (HCC) 02/08/2014   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) 02/06/2014    Past Surgical History:  Procedure Laterality Date   BIV ICD GENERATOR CHANGEOUT N/A 01/14/2024   Procedure: BIV ICD GENERATOR CHANGEOUT;  Surgeon: Inocencio Soyla Lunger, MD;  Location: [facility] INVASIVE CV LAB;  Service: Cardiovascular;  Laterality: N/A;   CARDIAC DEFIBRILLATOR PLACEMENT  07/2014   CATARACT EXTRACTION W/ INTRAOCULAR LENS IMPLANT  Left    COLONOSCOPY  08/26/2022   normal   CORONARY ANGIOGRAPHY N/A 03/11/2024   Procedure: CORONARY ANGIOGRAPHY;  Surgeon: Wendel Lurena POUR, MD;  Location: [facility] INVASIVE CV LAB;  Service: Cardiovascular;  Laterality: N/A;   CORONARY ARTERY BYPASS GRAFT  11/2013   ESOPHAGOGASTRODUODENOSCOPY  08/26/2022   esophageal dilation   lumb laminectomy  [phone]   x 3   LUMBAR FUSION  2013   NASAL SEPTOPLASTY W/ TURBINOPLASTY Bilateral 01/30/2016   Procedure: NASAL SEPTOPLASTY WITH BILATERAL TURBINATE REDUCTION;  Surgeon: Lonni FORBES Angle, MD;  Location: [facility] OR;  Service: ENT;  Laterality: Bilateral;   TONSILLECTOMY     UVULECTOMY N/A 01/30/2016   Procedure: OPHELIA;  Surgeon: Lonni FORBES Angle, MD;  Location: [facility] OR;  Service: ENT;  Laterality: N/A;       Home Medications    Prior to Admission medications   Medication Sig Start Date End Date Taking? Authorizing Provider  acetaminophen  (TYLENOL ) 500 MG tablet Take 1,000 mg by mouth in the morning.    [provider]  allopurinol  (ZYLOPRIM ) 300 MG tablet TAKE 1 TABLET EVERY DAY TO PREVENT GOUT 04/04/24   Booker Darice SAUNDERS, FNP  aspirin  EC 81 MG tablet Take 81 mg by mouth daily.    [provider]  Cholecalciferol (VITAMIN D -3) 125 MCG (5000 UT) TABS Take 5,000 Units by mouth daily.    [provider]  Choline Dihydrogen Citrate (CHOLINE CITRATE PO) Take 1 tablet by mouth at bedtime.    [provider]  Cyanocobalamin  (B-12) 1000 MCG CAPS Take 2,500 mg by mouth daily.    [provider]  diphenhydrAMINE  (BENADRYL ) 25 mg capsule Take 50 mg by mouth at bedtime.    [provider]  docusate sodium  (COLACE) 100 MG capsule Take 100 mg by mouth daily with lunch.    [provider]  donepezil  (ARICEPT ) 10 MG tablet Take 1 tablet (10 mg total) by mouth at bedtime. 01/04/24   Sherryl Bouchard, NP  ELIQUIS  5 MG TABS tablet TAKE 1 TABLET TWICE DAILY 05/29/23   Colette Torrence GRADE, MD   ezetimibe  (ZETIA ) 10 MG tablet TAKE 1 TABLET EVERY DAY 07/22/23   Colette Torrence GRADE, MD  finasteride  (PROSCAR ) 5 MG tablet TAKE 1 TABLET EVERY DAY 05/15/23   Rucker, Torrence GRADE, MD  hydroxyurea  (HYDREA ) 500 MG capsule TAKE 2 CAPSULES ONE TIME DAILY WITH FOOD 07/21/23   Onesimo Emaline Brink, MD  ipratropium-albuterol  (DUONEB) 0.5-2.5 (3) MG/3ML SOLN Take 3 mLs by nebulization every 6 (six) hours as needed. 02/20/24   Ragan, Michael, FNP  isosorbide  mononitrate (IMDUR ) 30 MG 24 hr tablet TAKE 1 TABLET EVERY DAY 05/15/23   Rucker, Torrence GRADE, MD  levothyroxine  (SYNTHROID ) 200 MCG tablet TAKE 1 TAB DAILY ON EMPTY STOMACH WITH ONLY WATER FOR 30 MINS. NO ANTACIDS, CALCIUM  OR MAGNESIUM  FOR 4 HRS. AVOID BIOTIN. 11/19/23   Colette Torrence GRADE, MD  magnesium  oxide (MAG-OX) 400 MG tablet Take 400 mg by mouth at bedtime.    [provider]  Melatonin 10 MG TABS  Take 10 mg by mouth at bedtime.    [provider]  memantine  (NAMENDA ) 10 MG tablet Take 1 tablet (10 mg total) by mouth 2 (two) times daily. 01/04/24   Millikan, Megan, NP  metoprolol  (TOPROL -XL) 200 MG 24 hr tablet Take 1 tablet (200 mg total) by mouth daily. 03/09/24   Booker Darice SAUNDERS, FNP  MILK THISTLE PO Take 2 tablets by mouth daily.    [provider]  Multiple Vitamin (MULTIVITAMIN) tablet Take 1 tablet by mouth daily. With no iron    [provider]  omeprazole  (PRILOSEC) 20 MG capsule TAKE 1 CAPSULE TWICE DAILY  ( EVERY 12 HOURS  ) TO PREVENT HEARTBURN AND ACID REFLUX 09/23/23   Colette Torrence GRADE, MD  POLYETHYLENE GLYCOL 3350  PO Take 1 Capful by mouth at bedtime.    [provider]  Probiotic Product (PROBIOTIC-10 PO) Take 1 tablet by mouth daily.    [provider]  ramelteon  (ROZEREM ) 8 MG tablet TAKE 1 TABLET AT BEDTIME 12/07/23   Rucker, Torrence GRADE, MD  rosuvastatin  (CRESTOR ) 40 MG tablet TAKE 1 TABLET BY MOUTH DAILY. 05/15/23   Colette Torrence GRADE, MD  tamsulosin  (FLOMAX ) 0.4 MG CAPS capsule TAKE 1  CAPSULE AT BEDTIME FOR PROSTATE AND BLADDER 04/04/24   Booker Darice SAUNDERS, FNP  torsemide  (DEMADEX ) 20 MG tablet TAKE 1 TABLET EVERY MORNING FOR SWELLING IN LEGS 04/04/24   Booker Darice SAUNDERS, FNP  Turmeric 500 MG TABS Take 500 mg by mouth daily.    [provider]    Family History Family History  Problem Relation Age of Onset   Alzheimer's disease Mother    Mental illness Mother    Depression Mother    Heart disease Father 30       had 5 MI's & died 53 yo   Hypertension Father    Alcohol abuse Son    Heart attack Son    Stroke Maternal Grandmother    Dementia Maternal Grandmother    Cancer - Prostate Other    Colon cancer Neg Hx    Stomach cancer Neg Hx    Esophageal cancer Neg Hx     Social History Social History   Tobacco Use   Smoking status: Former    Current packs/day: 0.00    Average packs/day: 2.0 packs/day for 25.0 years (50.0 ttl pk-yrs)    Types: Cigarettes    Start date: 05/19/1958    Quit date: 05/20/1983    Years since quitting: 40.9   Smokeless tobacco: Never  Vaping Use   Vaping status: Never Used  Substance Use Topics   Alcohol use: Never   Drug use: Never     Allergies   Other   Review of Systems Review of Systems See HPI  Physical Exam Triage Vital Signs ED Triage Vitals  Encounter Vitals Group     BP 04/04/24 1236 137/79     Girls Systolic BP Percentile --      Girls Diastolic BP Percentile --      Boys Systolic BP Percentile --      Boys Diastolic BP Percentile --      Pulse Rate 04/04/24 1236 62     Resp 04/04/24 1236 16     Temp 04/04/24 1236 97.9 F (36.6 C)     Temp Source 04/04/24 1236 Oral     SpO2 04/04/24 1236 95 %     Weight 04/04/24 1239 231 lb (104.8 kg)     Height 04/04/24 1239 5'  9 (1.753 m)     Head Circumference --      Peak Flow --      Pain Score 04/04/24 1238 0     Pain Loc --      Pain Education --      Exclude from Growth Chart --    No data found.  Updated Vital Signs BP 137/79 (BP Location: Right  Arm)   Pulse 62   Temp 97.9 F (36.6 C) (Oral)   Resp 16   Ht 5' 9 (1.753 m)   Wt 104.8 kg   SpO2 95%   BMI 34.11 kg/m       Physical Exam Constitutional:      General: He is not in acute distress.    Appearance: He is well-developed. He is ill-appearing.     Comments: Chronically ill-appearing  HENT:     Head: Normocephalic and atraumatic.  Eyes:     Conjunctiva/sclera: Conjunctivae normal.     Pupils: Pupils are equal, round, and reactive to light.  Cardiovascular:     Rate and Rhythm: Normal rate.  Pulmonary:     Effort: Pulmonary effort is normal. No respiratory distress.  Musculoskeletal:        General: Normal range of motion.     Cervical back: Normal range of motion.  Skin:    General: Skin is warm and dry.     Findings: Lesion present.     Comments: Left elbow has mild soft tissue swelling.  There is a pressure ulcer directly over the tip of the olecranon process.  It measures 12 mm across.  It appears to be partial-thickness, has an eschar present  Neurological:     Mental Status: He is alert.      UC Treatments / Results  Labs (all labs ordered are listed, but only abnormal results are displayed) Labs Reviewed - No data to display  EKG   Radiology No results found.  Procedures Procedures (including critical care time)  Medications Ordered in UC Medications - No data to display  Initial Impression / Assessment and Plan / UC Course  I have reviewed the triage vital signs and the nursing notes.  Pertinent labs & imaging results that were available during my care of the patient were reviewed by me and considered in my medical decision making (see chart for details).     Patient is instructed to clean this every few days and apply Mediplex dressing or some sort of padding.  Wife is informed it will take a long time to heal.  Watch for infection Final Clinical Impressions(s) / UC Diagnoses   Final diagnoses:  Visit for wound check  Pressure  injury of skin of left elbow, unspecified injury stage     Discharge Instructions      Wash area and apply mediplex dressing every 3-4 days Watch for infection Make an appointment to see your PCP next week, or may return here   ED Prescriptions   None    PDMP not reviewed this encounter.   Maranda Jamee Jacob, MD 04/04/24 [phone]

## 2024-04-04 NOTE — Discharge Instructions (Signed)
 Wash area and apply mediplex dressing every 3-4 days Watch for infection Make an appointment to see your PCP next week, or may return here

## 2024-04-04 NOTE — Progress Notes (Signed)
 HEMATOLOGY ONCOLOGY PROGRESS NOTE  Date of service: 04/04/2024  Patient Care Team: Colette Torrence GRADE, MD as PCP - General (Family Medicine) Inocencio Soyla Lunger, MD as PCP - Electrophysiology (Cardiology) Pietro Redell RAMAN, MD as PCP - Cardiology (Cardiology) Autry, Ozell SAILOR, MD as Referring Physician (Cardiology) Jacqualyn Vinie NOVAK, MD as Referring Physician (Cardiology) Onesimo Emaline Brink, MD as Consulting Physician (Hematology) Nathanael Davies, [facility] (Inactive) as Pharmacist (Pharmacist) Dohmeier, Dedra, MD as Consulting Physician (Neurology)  CHIEF COMPLAINT/PURPOSE OF CONSULTATION: Follow-up for continued evaluation and management of polycythemia vera.  HISTORY OF PRESENTING ILLNESS: (02/09/2018) [name] [name] is a wonderful 83 y.o. male who has been referred to us  by Dr. Elsie Richards  for evaluation and management of Thrombocytosis. He is accompanied today by his wife. The pt reports that he is doing well overall.    The pt reports that he has never had any blood clots.  He has atrial fibrillation and has taken Coumadin  since 2015. He had heart surgery in 2015, and had his ICD placed in 2016. The pt also takes 81g aspirin  daily.    The pt notes that he has not been aware of his high platelets previously, but only as of his most recent labs. He denies any recent major bleeds, surgeries, and other trauma. He denies having a splenectomy at any time as well.    The pt notes that his right kidney gets sore from time to time when he doesn't stay well hydrated. He also notes that when he urinates at these times, his urine is very dark. The pt adds that he had kidney stones one time. The pt is also taking Lasix  and notes that he urinates frequently.    The pt adds that his gums have been very sore, even to touch from his tongue.   The pt notes that he does not want to use a CPAP after a bad experience with a previous sleep study that did diagnose him with sleep apnea. He notes that  he falls asleep several times a day as well, falling asleep easily when he sits down, and denying falling asleep while driving. He also notes some increased difficulty remembering.    Most recent lab results (12/09/17) of CBC is as follows: all values are WNL except for WBC at 11.9k, RDW at 18.4, PLT at 649k, ANC at 9.7k.   On review of systems, pt reports good energy levels, falling asleep several times a day, staying active, and denies recent surgery, recent trauma, bleeding, unexpected weight loss, pain along the spine, abdominal pains, and any other symptoms.    On Family Hx the pt reports prostate cancer.   INTERVAL HISTORY:  [name] [name] is a 83 y.o. male who is here today for continued evaluation and management of polycythemia vera. accompanied by wife   he was last seen by me on 11/17/2023; at the time he mentioned experiencing some weight gain, mild hallucinations, insomnia, arthritic pain, and abdominal pain related to indigestion.  Today, he says that he's been having some memory difficulties - still taking Aricept  10 mg daily and Namenda  for dementia.  States that they saw Dr. Wendel and was recommended that he not undergo surgery due to his risk of dementia progressing. Occasionally, his legs swell and his wife says that she gives him another torsemide  to manage this.  Wife says that he was complaining that his arms hurt about 1.5 weeks ago - right arm pain resolved but left arm pain persisted. When she looked  at his left arm, she noticed a stress ulcer had formed. Today, they saw urgent care and he was given Mediplex bandages.   She reports that she monitors his oxygen  levels, and is using his nebulizer and BOOST to increase his oxygen .  Denies any chest pain or change in bowel habits. He notes some SOB occasionally. He is tolerating Hydroxyurea  1000 mg daily.  He says that he has been eating well, and his weight has recently stabilized from him being about 220 LBS.   She  notes that they are expecting their 15th great-grandchild.  REVIEW OF SYSTEMS:   10 Point review of systems of done and is negative except as noted above.  MEDICAL HISTORY Past Medical History:  Diagnosis Date   A-fib [facility]) 01/2014   AICD (automatic cardioverter/defibrillator) present    [employer]   Anal fissure 11/10/2019   Arthritis    ASHD (arteriosclerotic heart disease) 2015   s/p CABG   Cataract    s/p left   CHF (congestive heart failure) (HCC)    COPD (chronic obstructive pulmonary disease) (HCC)    by CXR, pt unsure of this   Diverticulosis    GERD (gastroesophageal reflux disease)    Gout 2014   Hyperlipidemia    Hypertension    Hypothyroidism    LBBB (left bundle branch block)    Polycythemia vera (HCC)    Prediabetes 01/2014   Presence of permanent cardiac pacemaker    Sleep apnea    no CPAP    SURGICAL HISTORY Past Surgical History:  Procedure Laterality Date   BIV ICD GENERATOR CHANGEOUT N/A 01/14/2024   Procedure: BIV ICD GENERATOR CHANGEOUT;  Surgeon: Inocencio Soyla Lunger, MD;  Location: [facility] INVASIVE CV LAB;  Service: Cardiovascular;  Laterality: N/A;   CARDIAC DEFIBRILLATOR PLACEMENT  07/2014   CATARACT EXTRACTION W/ INTRAOCULAR LENS IMPLANT Left    COLONOSCOPY  08/26/2022   normal   CORONARY ANGIOGRAPHY N/A 03/11/2024   Procedure: CORONARY ANGIOGRAPHY;  Surgeon: Wendel Lurena POUR, MD;  Location: [facility] INVASIVE CV LAB;  Service: Cardiovascular;  Laterality: N/A;   CORONARY ARTERY BYPASS GRAFT  11/2013   ESOPHAGOGASTRODUODENOSCOPY  08/26/2022   esophageal dilation   lumb laminectomy  [phone]   x 3   LUMBAR FUSION  2013   NASAL SEPTOPLASTY W/ TURBINOPLASTY Bilateral 01/30/2016   Procedure: NASAL SEPTOPLASTY WITH BILATERAL TURBINATE REDUCTION;  Surgeon: Lonni FORBES Angle, MD;  Location: [facility] OR;  Service: ENT;  Laterality: Bilateral;   TONSILLECTOMY     UVULECTOMY N/A 01/30/2016   Procedure: OPHELIA;  Surgeon: Lonni FORBES Angle, MD;   Location: [facility] OR;  Service: ENT;  Laterality: N/A;    SOCIAL HISTORY Social History   Tobacco Use   Smoking status: Former    Current packs/day: 0.00    Average packs/day: 2.0 packs/day for 25.0 years (50.0 ttl pk-yrs)    Types: Cigarettes    Start date: 05/19/1958    Quit date: 05/20/1983    Years since quitting: 40.9   Smokeless tobacco: Never  Vaping Use   Vaping status: Never Used  Substance Use Topics   Alcohol use: Never   Drug use: Never    Social History   Social History Narrative   Lives with wife, [name]   Caffeine use: daily (Coffee) decaf    SOCIAL DRIVERS OF HEALTH SDOH Screenings   Food Insecurity: No Food Insecurity (06/05/2021)   Received from [facility]  Housing: Low Risk  (03/29/2021)  Transportation Needs: No Transportation Needs (03/29/2021)  Depression (PHQ2-9): Low Risk  (02/01/2024)  Financial Resource Strain: Unknown (07/28/2019)   Received from [facility]  Social Connections: Unknown (07/28/2019)   Received from [facility]  Tobacco Use: Medium Risk (04/04/2024)     FAMILY HISTORY Family History  Problem Relation Age of Onset   Alzheimer's disease Mother    Mental illness Mother    Depression Mother    Heart disease Father 67       had 5 MI's & died 39 yo   Hypertension Father    Alcohol abuse Son    Heart attack Son    Stroke Maternal Grandmother    Dementia Maternal Grandmother    Cancer - Prostate Other    Colon cancer Neg Hx    Stomach cancer Neg Hx    Esophageal cancer Neg Hx      ALLERGIES: is allergic to other.  MEDICATIONS  Current Outpatient Medications  Medication Sig Dispense Refill   acetaminophen  (TYLENOL ) 500 MG tablet Take 1,000 mg by mouth in the morning.     allopurinol  (ZYLOPRIM ) 300 MG tablet TAKE 1 TABLET EVERY DAY TO PREVENT GOUT 90 tablet 3   aspirin  EC 81 MG tablet Take 81 mg by mouth daily.     Cholecalciferol (VITAMIN D -3) 125 MCG (5000 UT) TABS Take 5,000 Units by mouth daily.     Choline  Dihydrogen Citrate (CHOLINE CITRATE PO) Take 1 tablet by mouth at bedtime.     Cyanocobalamin  (B-12) 1000 MCG CAPS Take 2,500 mg by mouth daily.     diphenhydrAMINE  (BENADRYL ) 25 mg capsule Take 50 mg by mouth at bedtime.     docusate sodium  (COLACE) 100 MG capsule Take 100 mg by mouth daily with lunch.     donepezil  (ARICEPT ) 10 MG tablet Take 1 tablet (10 mg total) by mouth at bedtime. 90 tablet 3   ELIQUIS  5 MG TABS tablet TAKE 1 TABLET TWICE DAILY 180 tablet 3   ezetimibe  (ZETIA ) 10 MG tablet TAKE 1 TABLET EVERY DAY 90 tablet 3   finasteride  (PROSCAR ) 5 MG tablet TAKE 1 TABLET EVERY DAY 90 tablet 3   hydroxyurea  (HYDREA ) 500 MG capsule TAKE 2 CAPSULES ONE TIME DAILY WITH FOOD 180 capsule 3   ipratropium-albuterol  (DUONEB) 0.5-2.5 (3) MG/3ML SOLN Take 3 mLs by nebulization every 6 (six) hours as needed. 360 mL 0   isosorbide  mononitrate (IMDUR ) 30 MG 24 hr tablet TAKE 1 TABLET EVERY DAY 90 tablet 3   levothyroxine  (SYNTHROID ) 200 MCG tablet TAKE 1 TAB DAILY ON EMPTY STOMACH WITH ONLY WATER  FOR 30 MINS. NO ANTACIDS, CALCIUM  OR MAGNESIUM  FOR 4 HRS. AVOID BIOTIN. 90 tablet 3   magnesium  oxide (MAG-OX) 400 MG tablet Take 400 mg by mouth at bedtime.     Melatonin 10 MG TABS Take 10 mg by mouth at bedtime.     memantine  (NAMENDA ) 10 MG tablet Take 1 tablet (10 mg total) by mouth 2 (two) times daily. 180 tablet 3   metoprolol  (TOPROL -XL) 200 MG 24 hr tablet Take 1 tablet (200 mg total) by mouth daily. 90 tablet 0   MILK THISTLE PO Take 2 tablets by mouth daily.     Multiple Vitamin (MULTIVITAMIN) tablet Take 1 tablet by mouth daily. With no iron     omeprazole  (PRILOSEC) 20 MG capsule TAKE 1 CAPSULE TWICE DAILY  ( EVERY 12 HOURS  ) TO PREVENT HEARTBURN AND ACID REFLUX 180 capsule 3   POLYETHYLENE GLYCOL 3350  PO Take 1 Capful by mouth at  bedtime.     Probiotic Product (PROBIOTIC-10 PO) Take 1 tablet by mouth daily.     ramelteon  (ROZEREM ) 8 MG tablet TAKE 1 TABLET AT BEDTIME 90 tablet 3    rosuvastatin  (CRESTOR ) 40 MG tablet TAKE 1 TABLET BY MOUTH DAILY. 90 tablet 3   tamsulosin  (FLOMAX ) 0.4 MG CAPS capsule TAKE 1 CAPSULE AT BEDTIME FOR PROSTATE AND BLADDER 90 capsule 3   torsemide  (DEMADEX ) 20 MG tablet TAKE 1 TABLET EVERY MORNING FOR SWELLING IN LEGS 90 tablet 3   Turmeric 500 MG TABS Take 500 mg by mouth daily.     No current facility-administered medications for this visit.    PHYSICAL EXAMINATION: ECOG PERFORMANCE STATUS: 1 - Symptomatic but completely ambulatory VITALS: Vitals:   04/04/24 1410  BP: 127/67  Pulse: 60  Resp: 18  Temp: 97.7 F (36.5 C)  SpO2: 94%   Wt Readings from Last 18 Encounters:  04/04/24 238 lb 14.4 oz (108.4 kg)  04/04/24 231 lb (104.8 kg)  04/01/24 239 lb 2 oz (108.5 kg)  03/15/24 234 lb 6.4 oz (106.3 kg)  03/11/24 225 lb (102.1 kg)  03/03/24 228 lb (103.4 kg)  02/23/24 252 lb (114.3 kg)  02/01/24 235 lb 8 oz (106.8 kg)  01/27/24 236 lb (107 kg)  01/14/24 240 lb (108.9 kg)  01/04/24 239 lb (108.4 kg)  12/18/23 242 lb 3.2 oz (109.9 kg)  11/17/23 239 lb 14.4 oz (108.8 kg)  08/24/23 230 lb 1.3 oz (104.4 kg)  07/16/23 231 lb 11.2 oz (105.1 kg)  07/15/23 231 lb 4.8 oz (104.9 kg)  07/01/23 232 lb (105.2 kg)  06/10/23 230 lb 3.2 oz (104.4 kg)   Body mass index is 35.28 kg/m.  GENERAL: alert, in no acute distress and comfortable SKIN: no acute rashes, no significant lesions EYES: conjunctiva are pink and non-injected, sclera anicteric OROPHARYNX: MMM, no exudates, no oropharyngeal erythema or ulceration NECK: supple, no JVD LYMPH:  no palpable lymphadenopathy in the cervical, axillary or inguinal regions LUNGS: clear to auscultation b/l with normal respiratory effort HEART: regular rate & rhythm ABDOMEN:  normoactive bowel sounds , non tender, not distended, no hepatosplenomegaly Extremity: no pedal edema PSYCH: alert & oriented x 3 with fluent speech NEURO: no focal motor/sensory deficits  LABORATORY DATA:   I have  reviewed the data as listed     Latest Ref Rng & Units 04/04/2024    1:59 PM 03/03/2024    2:58 PM 12/22/2023    2:28 PM  CBC EXTENDED  WBC 4.0 - 10.5 K/uL 19.1  18.3  16.8   RBC 4.22 - 5.81 MIL/uL 4.07  4.37  4.15   Hemoglobin 13.0 - 17.0 g/dL 85.1  84.0  84.2   HCT 39.0 - 52.0 % 45.5  47.9  46.0   Platelets 150 - 400 K/uL 452  356  448   NEUT# 1.7 - 7.7 K/uL 16.8     Lymph# 0.7 - 4.0 K/uL 1.3          Latest Ref Rng & Units 03/15/2024    2:20 PM 02/23/2024    2:40 PM 12/22/2023    2:28 PM  CMP  Glucose 70 - 99 mg/dL 858  877  857   BUN 8 - 27 mg/dL 13  25  17    Creatinine 0.76 - 1.27 mg/dL 9.00  8.80  8.80   Sodium 134 - 144 mmol/L 143  138  141   Potassium 3.5 - 5.2 mmol/L 4.3  4.1  4.8  Chloride 96 - 106 mmol/L 98  94  96   CO2 20 - 29 mmol/L 28  30  26    Calcium  8.6 - 10.2 mg/dL 9.2  9.6  9.9     0/75/80 JAK2 Mutation Study:      02/09/18 BCR ABL FISH:     RADIOGRAPHIC STUDIES: I have personally reviewed the radiological images as listed and agreed with the findings in the report. CUP PACEART REMOTE DEVICE CHECK Result Date: 04/01/2024 BIV ICD scheduled remote reviewed. Normal device function.  Presenting rhythm: AF-BVP Known AF, On OAC per chart. BVP 71%, previously reported in October. Sending to triage per 02/22/24 note. Next remote transmission per protocol. AB, CVRS  CARDIAC CATHETERIZATION Result Date: 03/11/2024   Origin lesion is 100% stenosed.   Origin to Prox Graft lesion is 100% stenosed.   RPAV lesion is 80% stenosed.   RPDA lesion is 50% stenosed.   Prox Cx lesion is 100% stenosed.   Mid LAD lesion is 100% stenosed. 1.  Patent LIMA to LAD.  The vein graft to OM and vein graft to diagonal could not be selectively visualized and are likely occluded (however there does seem to be competitive flow in the diagonal on  injection of native left coronary artery). 2.  Capacious iliofemoral vessels bilaterally. Recommendation: Continue evaluation for potential  aortic valve intervention.   CUP PACEART INCLINIC DEVICE CHECK Result Date: 01/28/2024 Normal multi chamber ICD wound check. Wound well healed. Presenting rhythm: AF/BP 60. Routine testing performed. Thresholds, sensing, and impedance consistent with implant measurements. No treated arrhythmias. Reviewed shock plan.  Pt enrolled in remote follow-up.Rozelle Banter, BSN, RN  EP PPM/ICD IMPLANT Result Date: 01/14/2024 SURGEON:  Will [name], MD   PREPROCEDURE DIAGNOSES:  1.  Chronic systolic heart failure  2. Elective Replacement Indicator Status   POSTPROCEDURE DIAGNOSES:  1.  Systolic heart failure  2. Elective Replacement Indicator Status   PROCEDURES:  1.  ICD pulse generator replacement.  2. Skin pocket revision.   INTRODUCTION:  [name] [name] is a 83 y.o. male with a history of chronic systolic heart failure. The patient has done well since he ICD was implanted.  The patient  has recently reached ERI battery status.  The patient presents today for ICD pulse generator replacement.     DESCRIPTION OF THE PROCEDURE:  Informed written consent was obtained, and the patient was brought to the electrophysiology lab in the fasting state.  The patient's ICD was interrogated today and found to be at elective replacement indicator battery status.  The patient's left chest was prepped and draped in the usual sterile fashion by the EP lab staff.  The skin overlying the existing ICD was infiltrated with lidocaine  for local analgesia.  A 4-cm incision was made over the ICD pocket.  Using a combination of sharp and blunt dissection, the ICD was exposed and removed from the body.  The device was disconnected from the leads. A single silk suture was identified and removed which had secured the device to the pectoralis fascia.  There was no foreign matter or debris within the pocket.  The atrial lead was confirmed to be a Medtronic CapSureFix [id number] (serial number [id number]) lead implanted on 07/2014.  The  right ventricular lead was confirmed to be a [employer] [id number]  (serial number [id number]) lead implanted on the same date as the atrial lead (above). The coronary sinus lead was confirmed to be a [employer] [id number] (serial number  [id number]) implanted on the same date as the atrial lead (above). Both leads were examined and their integrity was confirmed to be intact.  Atrial lead fib-waves measured 3.4 mV with impedance of 625 ohms.  Right ventricular lead R-waves measured greater than 25 mV with impedance of 400 ohms and a threshold of 1 V at 0.5 msec. The coronary sinus lead R waves measured 24.3 mV with impedence of 637 ohms and a threshold of 0.6 V at 0.5 msec. The leads were connected to a [employer] [id number] (serial number [id number]) CRT-D.  The pocket was revised to accommodate this new device.  Electrocautery was required to assure hemostasis.  The pocket was irrigated with copious gentamicin  solution. The ICD was then placed into the pocket.  A Medtronic Tyrx pouch was placed in the pocket. The pocket was then closed in 3 layers with 2-0 Vicryl suture over the subcutaneous and 3-0 Vicryl suture subcuticular layers.  Steri-Strips and a sterile dressing were then applied.EBL<38ml. There were no early apparent complications.   CONCLUSIONS:  1. Successful CRT-D pulse generator replacement for elective replacement indicator battery status  2. No early apparent complications.   Will Inocencio, MD 01/14/2024 2:29 PM    ASSESSMENT & PLAN:   83 y.o. male with  1. 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 Some associated leukocytosis. No history of splenectomy. No previous h/o VTE, but significant cardiac risk factors. -Patient's aspirin  and Warfarin has been protective against vascular events    02/09/18 JAK2 mutation study revealed the JAK 2 mutation [id number] at 75% allele  frequency consistent with Essential thrombocytosis.    02/09/18 BCR ABL FISH revealed no evidence of BCR/ABL rearrangement    2.  Patient Active Problem List   Diagnosis Date Noted   Severe aortic stenosis 02/23/2024   Acute non-recurrent maxillary sinusitis 01/14/2023   Moderate late onset Alzheimer's dementia (HCC) 12/15/2022   Hypotension 12/15/2022   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) 12/15/2022   Acute CVA (cerebrovascular accident) (HCC) 09/16/2022   Chronic diastolic CHF (congestive heart failure) (HCC) 09/16/2022   Encounter for screening colonoscopy 08/26/2022   Chronic ulcer of left foot with fat layer exposed (HCC) 07/10/2022   Zenker's diverticulum 10/16/2021   Loss of appetite 10/10/2021   Loss of weight 10/10/2021   Generalized abdominal pain 10/10/2021   Thrombosed external hemorrhoid 10/10/2021   Dysphagia 08/01/2021   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   Intolerance of continuous positive airway pressure (CPAP) ventilation 05/02/2021   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   JAK2 [id number] mutation 08/27/2020   Rectal pain 11/10/2019   Constipation 11/10/2019   Abnormal glucose 10/12/2019   Memory changes 01/10/2019   Polycythemia vera (HCC) 10/21/2018   Thrombocytosis 12/29/2017   Senile purpura 09/01/2017   Emphysema of lung (HCC) 06/01/2017   Tortuous aorta (HCC)- CXR 2018 06/01/2017   Regular astigmatism, right eye 10/16/2016   Combined forms of age-related cataract of right eye 10/16/2016   Trigger ring finger of left hand 08/06/2016   Bilateral carpal tunnel syndrome 08/06/2016   Ischemic cardiomyopathy 06/22/2015   ASHD/CABG x 3V (11/2013) 02/06/2015   Acquired thrombophilia (HCC) 02/06/2015   Cardiac pacemaker/AICD (01/2014) 02/06/2015   Morbid obesity (HCC) 01/03/2015   HTN (hypertension) 01/02/2015   Hyperlipidemia 01/02/2015   Vitamin D  deficiency 01/02/2015   GERD  01/02/2015   BPH (benign prostatic  hyperplasia)  01/02/2015   Medication management 01/02/2015   Atrial fibrillation (HCC) 01/02/2015   Hypothyroidism 01/02/2015   OSA and COPD overlap syndrome (HCC) 01/02/2015   Chronic systolic (congestive) heart failure (HCC) 02/17/2014   S/P CABG x 3 02/13/2014   Atherosclerotic heart disease of native coronary artery without angina pectoris 02/08/2014   Gout 02/08/2014   Cardiomyopathy (HCC) 02/08/2014   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) 02/06/2014  -continue f/u with PCP to optimize atherosclerotic risk factors.   PLAN: - Discussed lab results on 04/04/2024 in detail with patient: CBC showed WBC of 19.1K increased from 18.3K, Hemoglobin of 14.8, and PLTs of 452K increased from 356K. CMP stable - no notable toxicities from current dose of hydrea  @ 1000mg  po daily and will continue the same dose. - Continue dressing wound and monitoring for infection.  - Continue to monitor Oxygen  levels with PCP/pulmonary and to determine need for home oxygen .  FOLLOW-UP RTC with Dr Onesimo with labs in 4 months  The total time spent in the appointment was 20 minutes* .  All of the patient's questions were answered and the patient knows to call the clinic with any problems, questions, or concerns.  Emaline Onesimo MD MS AAHIVMS [facility] Hematology/Oncology Physician [facility]  *Total Encounter Time as defined by the Centers for Medicare and Medicaid Services includes, in addition to the face-to-face time of a patient visit (documented in the note above) non-face-to-face time: obtaining and reviewing outside history, ordering and reviewing medications, tests or procedures, care coordination (communications with other health care professionals or caregivers) and documentation in the medical record.  I,Emily Lagle,acting as a neurosurgeon for Emaline Onesimo, MD.,have documented all relevant documentation on the behalf of Emaline Onesimo, MD,as directed by  Emaline Onesimo, MD while in  the presence of Emaline Onesimo, MD.  I have reviewed the above documentation for accuracy and completeness, and I agree with the above.  Stclair Szymborski, MD

## 2024-04-05 ENCOUNTER — Ambulatory Visit: Payer: Self-pay | Admitting: Cardiology

## 2024-04-05 NOTE — Progress Notes (Signed)
 Remote ICD Transmission

## 2024-04-10 ENCOUNTER — Encounter: Payer: Self-pay | Admitting: Hematology

## 2024-04-13 ENCOUNTER — Encounter: Payer: Self-pay | Admitting: Emergency Medicine

## 2024-04-13 ENCOUNTER — Ambulatory Visit
Admission: EM | Admit: 2024-04-13 | Discharge: 2024-04-13 | Disposition: A | Discharge status: Home / Self Care | Attending: Family Medicine | Admitting: Family Medicine

## 2024-04-13 DIAGNOSIS — K219 Gastro-esophageal reflux disease without esophagitis: Secondary | ICD-10-CM | POA: Diagnosis present

## 2024-04-13 DIAGNOSIS — I288 Other diseases of pulmonary vessels: Secondary | ICD-10-CM | POA: Diagnosis not present

## 2024-04-13 DIAGNOSIS — C946 Myelodysplastic disease, not classified: Secondary | ICD-10-CM | POA: Diagnosis present

## 2024-04-13 DIAGNOSIS — N170 Acute kidney failure with tubular necrosis: Secondary | ICD-10-CM | POA: Diagnosis present

## 2024-04-13 DIAGNOSIS — I110 Hypertensive heart disease with heart failure: Secondary | ICD-10-CM | POA: Diagnosis present

## 2024-04-13 DIAGNOSIS — I5022 Chronic systolic (congestive) heart failure: Secondary | ICD-10-CM | POA: Diagnosis not present

## 2024-04-13 DIAGNOSIS — E039 Hypothyroidism, unspecified: Secondary | ICD-10-CM | POA: Diagnosis present

## 2024-04-13 DIAGNOSIS — R609 Edema, unspecified: Secondary | ICD-10-CM | POA: Diagnosis not present

## 2024-04-13 DIAGNOSIS — I2721 Secondary pulmonary arterial hypertension: Secondary | ICD-10-CM | POA: Diagnosis not present

## 2024-04-13 DIAGNOSIS — R161 Splenomegaly, not elsewhere classified: Secondary | ICD-10-CM | POA: Diagnosis not present

## 2024-04-13 DIAGNOSIS — I350 Nonrheumatic aortic (valve) stenosis: Secondary | ICD-10-CM | POA: Diagnosis present

## 2024-04-13 DIAGNOSIS — F419 Anxiety disorder, unspecified: Secondary | ICD-10-CM | POA: Diagnosis not present

## 2024-04-13 DIAGNOSIS — I480 Paroxysmal atrial fibrillation: Secondary | ICD-10-CM | POA: Diagnosis present

## 2024-04-13 DIAGNOSIS — E278 Other specified disorders of adrenal gland: Secondary | ICD-10-CM | POA: Diagnosis not present

## 2024-04-13 DIAGNOSIS — M109 Gout, unspecified: Secondary | ICD-10-CM | POA: Diagnosis present

## 2024-04-13 DIAGNOSIS — I088 Other rheumatic multiple valve diseases: Secondary | ICD-10-CM | POA: Diagnosis not present

## 2024-04-13 DIAGNOSIS — R410 Disorientation, unspecified: Secondary | ICD-10-CM | POA: Diagnosis not present

## 2024-04-13 DIAGNOSIS — I501 Left ventricular failure: Secondary | ICD-10-CM | POA: Diagnosis not present

## 2024-04-13 DIAGNOSIS — I5033 Acute on chronic diastolic (congestive) heart failure: Secondary | ICD-10-CM | POA: Diagnosis present

## 2024-04-13 DIAGNOSIS — R0602 Shortness of breath: Secondary | ICD-10-CM | POA: Diagnosis not present

## 2024-04-13 DIAGNOSIS — I5043 Acute on chronic combined systolic (congestive) and diastolic (congestive) heart failure: Secondary | ICD-10-CM | POA: Diagnosis not present

## 2024-04-13 DIAGNOSIS — R931 Abnormal findings on diagnostic imaging of heart and coronary circulation: Secondary | ICD-10-CM | POA: Diagnosis not present

## 2024-04-13 DIAGNOSIS — I4891 Unspecified atrial fibrillation: Secondary | ICD-10-CM | POA: Diagnosis not present

## 2024-04-13 DIAGNOSIS — D471 Chronic myeloproliferative disease: Secondary | ICD-10-CM | POA: Diagnosis not present

## 2024-04-13 DIAGNOSIS — I25810 Atherosclerosis of coronary artery bypass graft(s) without angina pectoris: Secondary | ICD-10-CM | POA: Diagnosis not present

## 2024-04-13 DIAGNOSIS — I959 Hypotension, unspecified: Secondary | ICD-10-CM | POA: Diagnosis not present

## 2024-04-13 DIAGNOSIS — I447 Left bundle-branch block, unspecified: Secondary | ICD-10-CM | POA: Diagnosis not present

## 2024-04-13 DIAGNOSIS — I2510 Atherosclerotic heart disease of native coronary artery without angina pectoris: Secondary | ICD-10-CM | POA: Diagnosis present

## 2024-04-13 DIAGNOSIS — F0390 Unspecified dementia without behavioral disturbance: Secondary | ICD-10-CM | POA: Diagnosis present

## 2024-04-13 DIAGNOSIS — M7989 Other specified soft tissue disorders: Secondary | ICD-10-CM | POA: Diagnosis not present

## 2024-04-13 DIAGNOSIS — J189 Pneumonia, unspecified organism: Secondary | ICD-10-CM | POA: Diagnosis present

## 2024-04-13 DIAGNOSIS — Z951 Presence of aortocoronary bypass graft: Secondary | ICD-10-CM | POA: Diagnosis not present

## 2024-04-13 DIAGNOSIS — J9601 Acute respiratory failure with hypoxia: Secondary | ICD-10-CM | POA: Diagnosis present

## 2024-04-13 DIAGNOSIS — Z7982 Long term (current) use of aspirin: Secondary | ICD-10-CM | POA: Diagnosis not present

## 2024-04-13 DIAGNOSIS — I330 Acute and subacute infective endocarditis: Secondary | ICD-10-CM | POA: Diagnosis present

## 2024-04-13 DIAGNOSIS — J90 Pleural effusion, not elsewhere classified: Secondary | ICD-10-CM | POA: Diagnosis not present

## 2024-04-13 DIAGNOSIS — G4733 Obstructive sleep apnea (adult) (pediatric): Secondary | ICD-10-CM | POA: Diagnosis present

## 2024-04-13 DIAGNOSIS — I272 Pulmonary hypertension, unspecified: Secondary | ICD-10-CM | POA: Diagnosis present

## 2024-04-13 DIAGNOSIS — E785 Hyperlipidemia, unspecified: Secondary | ICD-10-CM | POA: Diagnosis present

## 2024-04-13 DIAGNOSIS — D7589 Other specified diseases of blood and blood-forming organs: Secondary | ICD-10-CM | POA: Diagnosis present

## 2024-04-13 DIAGNOSIS — G9340 Encephalopathy, unspecified: Secondary | ICD-10-CM | POA: Diagnosis not present

## 2024-04-13 DIAGNOSIS — R0902 Hypoxemia: Secondary | ICD-10-CM | POA: Diagnosis not present

## 2024-04-13 DIAGNOSIS — Z95810 Presence of automatic (implantable) cardiac defibrillator: Secondary | ICD-10-CM | POA: Diagnosis not present

## 2024-04-13 DIAGNOSIS — J440 Chronic obstructive pulmonary disease with acute lower respiratory infection: Secondary | ICD-10-CM | POA: Diagnosis present

## 2024-04-13 DIAGNOSIS — I509 Heart failure, unspecified: Secondary | ICD-10-CM | POA: Diagnosis not present

## 2024-04-13 DIAGNOSIS — R230 Cyanosis: Secondary | ICD-10-CM | POA: Diagnosis not present

## 2024-04-13 DIAGNOSIS — I2722 Pulmonary hypertension due to left heart disease: Secondary | ICD-10-CM | POA: Diagnosis not present

## 2024-04-13 DIAGNOSIS — J811 Chronic pulmonary edema: Secondary | ICD-10-CM | POA: Diagnosis not present

## 2024-04-13 DIAGNOSIS — R7989 Other specified abnormal findings of blood chemistry: Secondary | ICD-10-CM | POA: Diagnosis not present

## 2024-04-13 DIAGNOSIS — Z515 Encounter for palliative care: Secondary | ICD-10-CM | POA: Diagnosis not present

## 2024-04-13 DIAGNOSIS — F40240 Claustrophobia: Secondary | ICD-10-CM | POA: Diagnosis present

## 2024-04-13 DIAGNOSIS — N179 Acute kidney failure, unspecified: Secondary | ICD-10-CM | POA: Diagnosis not present

## 2024-04-13 DIAGNOSIS — Z8659 Personal history of other mental and behavioral disorders: Secondary | ICD-10-CM | POA: Diagnosis not present

## 2024-04-13 DIAGNOSIS — Z8709 Personal history of other diseases of the respiratory system: Secondary | ICD-10-CM | POA: Diagnosis not present

## 2024-04-13 DIAGNOSIS — Z79899 Other long term (current) drug therapy: Secondary | ICD-10-CM | POA: Diagnosis not present

## 2024-04-13 DIAGNOSIS — Z8739 Personal history of other diseases of the musculoskeletal system and connective tissue: Secondary | ICD-10-CM | POA: Diagnosis not present

## 2024-04-13 DIAGNOSIS — N400 Enlarged prostate without lower urinary tract symptoms: Secondary | ICD-10-CM | POA: Diagnosis present

## 2024-04-13 DIAGNOSIS — K8020 Calculus of gallbladder without cholecystitis without obstruction: Secondary | ICD-10-CM | POA: Diagnosis not present

## 2024-04-13 DIAGNOSIS — R918 Other nonspecific abnormal finding of lung field: Secondary | ICD-10-CM | POA: Diagnosis not present

## 2024-04-13 NOTE — ED Triage Notes (Signed)
 Patient here for SOB that started this am.

## 2024-04-13 NOTE — ED Notes (Addendum)
 Patient was placed on 3 liters of oxygen .  Oxygen  sitting at 88% on 3 liters of oxygen .  Per Dr Maranda oxygen  was moved to 4 liters via [place].

## 2024-04-13 NOTE — ED Notes (Signed)
 Patient is being discharged from the Urgent Care and sent to the Emergency Department via EMS . Per Dr Maranda, patient is in need of higher level of care due to further evaluation. Patient is aware and verbalizes understanding of plan of care.  Vitals:   04/13/24 1512  Pulse: 77  Temp: 98.2 F (36.8 C)  SpO2: (!) 83%

## 2024-04-13 NOTE — Discharge Instructions (Signed)
 TO ER.

## 2024-04-13 NOTE — ED Provider Notes (Signed)
 [name]    CSN: [id number] Arrival date & time: 04/13/24  1503      History   Chief Complaint Chief Complaint  Patient presents with   Shortness of Breath    HPI [name] [name] is a 83 y.o. male.   HPI  Patient is in end-stage congestive heart failure.  He was placed in a palliative status by his cardiologist when last seen a month ago.  He has been doing well at home.  Unfortunately, today he became acutely short of breath.  His wife tried to call 911 but he would not agree to go to the hospital.  She instead brought him here He denies chest pain or palpitations He is acutely short of breath and clearly working to breathe when wheeled through the door.  Lips have a cyanotic hue Initial oxygen  level 77%  Past Medical History:  Diagnosis Date   A-fib (HCC) 01/2014   AICD (automatic cardioverter/defibrillator) present    [employer]   Anal fissure 11/10/2019   Arthritis    ASHD (arteriosclerotic heart disease) 2015   s/p CABG   Cataract    s/p left   CHF (congestive heart failure) (HCC)    COPD (chronic obstructive pulmonary disease) (HCC)    by CXR, pt unsure of this   Diverticulosis    GERD (gastroesophageal reflux disease)    Gout 2014   Hyperlipidemia    Hypertension    Hypothyroidism    LBBB (left bundle branch block)    Polycythemia vera (HCC)    Prediabetes 01/2014   Presence of permanent cardiac pacemaker    Sleep apnea    no CPAP    Patient Active Problem List   Diagnosis Date Noted   Severe aortic stenosis 02/23/2024   Acute non-recurrent maxillary sinusitis 01/14/2023   Moderate late onset Alzheimer's dementia (HCC) 12/15/2022   Hypotension 12/15/2022   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) 12/15/2022   Acute CVA (cerebrovascular accident) (HCC) 09/16/2022   Chronic diastolic CHF (congestive heart failure) (HCC) 09/16/2022   Encounter for screening colonoscopy 08/26/2022   Chronic ulcer of left  foot with fat layer exposed (HCC) 07/10/2022   Zenker's diverticulum 10/16/2021   Loss of appetite 10/10/2021   Loss of weight 10/10/2021   Generalized abdominal pain 10/10/2021   Thrombosed external hemorrhoid 10/10/2021   Dysphagia 08/01/2021   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   Intolerance of continuous positive airway pressure (CPAP) ventilation 05/02/2021   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   JAK2 V617F mutation 08/27/2020   Rectal pain 11/10/2019   Constipation 11/10/2019   Abnormal glucose 10/12/2019   Memory changes 01/10/2019   Polycythemia vera (HCC) 10/21/2018   Thrombocytosis 12/29/2017   Senile purpura 09/01/2017   Emphysema of lung (HCC) 06/01/2017   Tortuous aorta (HCC)- CXR 2018 06/01/2017   Regular astigmatism, right eye 10/16/2016   Combined forms of age-related cataract of right eye 10/16/2016   Trigger ring finger of left hand 08/06/2016   Bilateral carpal tunnel syndrome 08/06/2016   Ischemic cardiomyopathy 06/22/2015   ASHD/CABG x 3V (11/2013) 02/06/2015   Acquired thrombophilia (HCC) 02/06/2015   Cardiac pacemaker/AICD (01/2014) 02/06/2015   Morbid obesity (HCC) 01/03/2015   HTN (hypertension) 01/02/2015   Hyperlipidemia 01/02/2015   Vitamin D  deficiency 01/02/2015   GERD  01/02/2015   BPH (benign prostatic hyperplasia) 01/02/2015   Medication management 01/02/2015   Atrial fibrillation (HCC) 01/02/2015   Hypothyroidism 01/02/2015   OSA and COPD overlap syndrome (  HCC) 01/02/2015   Chronic systolic (congestive) heart failure (HCC) 02/17/2014   S/P CABG x 3 02/13/2014   Atherosclerotic heart disease of native coronary artery without angina pectoris 02/08/2014   Gout 02/08/2014   Cardiomyopathy (HCC) 02/08/2014   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) 02/06/2014    Past Surgical History:  Procedure Laterality Date   BIV ICD GENERATOR CHANGEOUT N/A 01/14/2024   Procedure: BIV ICD GENERATOR CHANGEOUT;  Surgeon:  Inocencio Soyla Lunger, MD;  Location: [facility] INVASIVE CV LAB;  Service: Cardiovascular;  Laterality: N/A;   CARDIAC DEFIBRILLATOR PLACEMENT  07/2014   CATARACT EXTRACTION W/ INTRAOCULAR LENS IMPLANT Left    COLONOSCOPY  08/26/2022   normal   CORONARY ANGIOGRAPHY N/A 03/11/2024   Procedure: CORONARY ANGIOGRAPHY;  Surgeon: Wendel Lurena POUR, MD;  Location: [facility] INVASIVE CV LAB;  Service: Cardiovascular;  Laterality: N/A;   CORONARY ARTERY BYPASS GRAFT  11/2013   ESOPHAGOGASTRODUODENOSCOPY  08/26/2022   esophageal dilation   lumb laminectomy  [phone]   x 3   LUMBAR FUSION  2013   NASAL SEPTOPLASTY W/ TURBINOPLASTY Bilateral 01/30/2016   Procedure: NASAL SEPTOPLASTY WITH BILATERAL TURBINATE REDUCTION;  Surgeon: Lonni FORBES Angle, MD;  Location: [facility] OR;  Service: ENT;  Laterality: Bilateral;   TONSILLECTOMY     UVULECTOMY N/A 01/30/2016   Procedure: OPHELIA;  Surgeon: Lonni FORBES Angle, MD;  Location: [facility];  Service: ENT;  Laterality: N/A;       Home Medications    Prior to Admission medications   Medication Sig Start Date End Date Taking? Authorizing Provider  acetaminophen  (TYLENOL ) 500 MG tablet Take 1,000 mg by mouth in the morning.    [provider]  allopurinol  (ZYLOPRIM ) 300 MG tablet TAKE 1 TABLET EVERY DAY TO PREVENT GOUT 04/04/24   Booker Darice SAUNDERS, FNP  aspirin  EC 81 MG tablet Take 81 mg by mouth daily.    [provider]  Cholecalciferol (VITAMIN D -3) 125 MCG (5000 UT) TABS Take 5,000 Units by mouth daily.    [provider]  Choline Dihydrogen Citrate (CHOLINE CITRATE PO) Take 1 tablet by mouth at bedtime.    [provider]  Cyanocobalamin  (B-12) 1000 MCG CAPS Take 2,500 mg by mouth daily.    [provider]  diphenhydrAMINE  (BENADRYL ) 25 mg capsule Take 50 mg by mouth at bedtime.    [provider]  docusate sodium  (COLACE) 100 MG capsule Take 100 mg by mouth daily with lunch.    [provider]   donepezil  (ARICEPT ) 10 MG tablet Take 1 tablet (10 mg total) by mouth at bedtime. 01/04/24   Sherryl Bouchard, NP  ELIQUIS  5 MG TABS tablet TAKE 1 TABLET TWICE DAILY 05/29/23   Colette Torrence GRADE, MD  ezetimibe  (ZETIA ) 10 MG tablet TAKE 1 TABLET EVERY DAY 07/22/23   Colette Torrence GRADE, MD  finasteride  (PROSCAR ) 5 MG tablet TAKE 1 TABLET EVERY DAY 05/15/23   Rucker, Torrence GRADE, MD  hydroxyurea  (HYDREA ) 500 MG capsule TAKE 2 CAPSULES ONE TIME DAILY WITH FOOD 07/21/23   Onesimo Emaline Brink, MD  ipratropium-albuterol  (DUONEB) 0.5-2.5 (3) MG/3ML SOLN Take 3 mLs by nebulization every 6 (six) hours as needed. 02/20/24   Ragan, Michael, FNP  isosorbide  mononitrate (IMDUR ) 30 MG 24 hr tablet TAKE 1 TABLET EVERY DAY 05/15/23   Rucker, Torrence GRADE, MD  levothyroxine  (SYNTHROID ) 200 MCG tablet TAKE 1 TAB DAILY ON EMPTY STOMACH WITH ONLY WATER  FOR 30 MINS. NO ANTACIDS, CALCIUM  OR MAGNESIUM  FOR 4 HRS. AVOID  BIOTIN. 11/19/23   Colette Torrence GRADE, MD  magnesium  oxide (MAG-OX) 400 MG tablet Take 400 mg by mouth at bedtime.    [provider]  Melatonin 10 MG TABS Take 10 mg by mouth at bedtime.    [provider]  memantine  (NAMENDA ) 10 MG tablet Take 1 tablet (10 mg total) by mouth 2 (two) times daily. 01/04/24   Millikan, Megan, NP  metoprolol  (TOPROL -XL) 200 MG 24 hr tablet Take 1 tablet (200 mg total) by mouth daily. 03/09/24   Booker Darice SAUNDERS, FNP  MILK THISTLE PO Take 2 tablets by mouth daily.    [provider]  Multiple Vitamin (MULTIVITAMIN) tablet Take 1 tablet by mouth daily. With no iron    [provider]  omeprazole  (PRILOSEC) 20 MG capsule TAKE 1 CAPSULE TWICE DAILY  ( EVERY 12 HOURS  ) TO PREVENT HEARTBURN AND ACID REFLUX 09/23/23   Colette Torrence GRADE, MD  POLYETHYLENE GLYCOL 3350  PO Take 1 Capful by mouth at bedtime.    [provider]  Probiotic Product (PROBIOTIC-10 PO) Take 1 tablet by mouth daily.    [provider]  ramelteon  (ROZEREM ) 8 MG tablet TAKE 1  TABLET AT BEDTIME 12/07/23   Rucker, Torrence GRADE, MD  rosuvastatin  (CRESTOR ) 40 MG tablet TAKE 1 TABLET BY MOUTH DAILY. 05/15/23   Colette Torrence GRADE, MD  tamsulosin  (FLOMAX ) 0.4 MG CAPS capsule TAKE 1 CAPSULE AT BEDTIME FOR PROSTATE AND BLADDER 04/04/24   Booker Darice SAUNDERS, FNP  torsemide  (DEMADEX ) 20 MG tablet TAKE 1 TABLET EVERY MORNING FOR SWELLING IN LEGS 04/04/24   Booker Darice SAUNDERS, FNP  Turmeric 500 MG TABS Take 500 mg by mouth daily.    [provider]    Family History Family History  Problem Relation Age of Onset   Alzheimer's disease Mother    Mental illness Mother    Depression Mother    Heart disease Father 24       had 5 MI's & died 49 yo   Hypertension Father    Alcohol abuse Son    Heart attack Son    Stroke Maternal Grandmother    Dementia Maternal Grandmother    Cancer - Prostate Other    Colon cancer Neg Hx    Stomach cancer Neg Hx    Esophageal cancer Neg Hx     Social History Social History   Tobacco Use   Smoking status: Former    Current packs/day: 0.00    Average packs/day: 2.0 packs/day for 25.0 years (50.0 ttl pk-yrs)    Types: Cigarettes    Start date: 05/19/1958    Quit date: 05/20/1983    Years since quitting: 40.9   Smokeless tobacco: Never  Vaping Use   Vaping status: Never Used  Substance Use Topics   Alcohol use: Never   Drug use: Never     Allergies   Other   Review of Systems Review of Systems See HPI  Physical Exam Triage Vital Signs ED Triage Vitals  Encounter Vitals Group     BP --      Girls Systolic BP Percentile --      Girls Diastolic BP Percentile --      Boys Systolic BP Percentile --      Boys Diastolic BP Percentile --      Pulse Rate 04/13/24 1512 77     Resp --      Temp 04/13/24 1512 98.2 F (36.8 C)     Temp Source  04/13/24 1512 Oral     SpO2 04/13/24 1512 (!) 83 %     Weight --      Height --      Head Circumference --      Peak Flow --      Pain Score 04/13/24 1513 0     Pain Loc --      Pain  Education --      Exclude from Growth Chart --    No data found.  Updated Vital Signs Pulse 77   Temp 98.2 F (36.8 C) (Oral)   SpO2 (!) 83%   Physical Exam Constitutional:      General: He is in acute distress.     Appearance: He is well-developed and normal weight. He is ill-appearing.  Pulmonary:     Effort: Tachypnea and respiratory distress present.     Breath sounds: Examination of the right-middle field reveals rales. Examination of the left-middle field reveals rales. Examination of the right-lower field reveals rales. Examination of the left-lower field reveals rales. Rales present.  Musculoskeletal:     Right lower leg: Edema present.     Left lower leg: Edema present.  Neurological:     Mental Status: He is alert.      UC Treatments / Results  Labs (all labs ordered are listed, but only abnormal results are displayed) Labs Reviewed - No data to display  EKG   Radiology No results found.  Procedures Procedures (including critical care time)  Medications Ordered in UC Medications - No data to display  Initial Impression / Assessment and Plan / UC Course  I have reviewed the triage vital signs and the nursing notes.  Pertinent labs & imaging results that were available during my care of the patient were reviewed by me and considered in my medical decision making (see chart for details).     Patient is acutely in respiratory duress with rales in both lungs.  Likely cardiac etiology.  911 was called for immediate transportation to the emergency room.  Suspect acute on chronic congestive heart failure due to cardiac etiology. Final Clinical Impressions(s) / UC Diagnoses   Final diagnoses:  Hypoxia  Acute on chronic combined systolic and diastolic CHF (congestive heart failure) [facility])     Discharge Instructions      TO ER   ED Prescriptions   None    PDMP not reviewed this encounter.   Maranda Jamee Jacob, MD 04/13/24 [phone]

## 2024-04-14 NOTE — Progress Notes (Signed)
 [facility] Progress Note 04/14/2024  Hospital Day 1  Assessement   Active Hospital Problems   *Acute hypoxic respiratory failure (*)    Benign prostatic hyperplasia without lower urinary tract symptoms    History of dementia    Macrocytosis without anemia    Acute on chronic diastolic congestive heart failure (*)    MPN (myeloproliferative neoplasm) (*)    History of gout    History of COPD    Elevated serum creatinine    ICD (implantable cardioverter-defibrillator), biventricular, in situ    Coronary artery disease involving coronary bypass graft of native heart without angina pectoris    PAF (paroxysmal atrial fibrillation) (*)    Adult hypothyroidism    Essential (primary) hypertension    Chronic systolic congestive heart failure (*)    Pulmonary hypertension due to left heart disease (*)     Plan  # Acute Diastolic CHF Exacerbation- BNP is elevated at 8591 and CXR is consistent with pulmonary edema. Continue IV Lasix  40 mg BID and place on strict I/O with 2L fluid restriction. Net outpt at this time is +580 but appears to be inaccurate. Continue losartan  100 mg daily and spirolactone 25 mg daily .Will consider starting Jardiance  at discharge. Awaiting results of repeat Echo.    # Acute Hypoxic Respiratory Failure - Patient desatted to 80% at home. On bipap. Pulmonology following.  Procalcitonin mildly elevated at 0.10. continue to cover for possible pneumonia with abx.     # Elevated Serum Cr- Cr is elevated at 1.6. Unclear baseline. DDX includes cardiorenal syndrome or CKD. Will trend daily Cr and renally dose all meds    # PAF- continue Toprol  Xl 200 mg daily and Apixaban  5 g BID.   # CAD with Bypass Graff and Native Heart Without Angina- continue ASA 81 mg daily, Crestor  40 mg daily, Zeita 10 mg daily, and Imdur  30 mg daily.   # History of COPD- continue Duonebs every 6 hours and PRN q4 hours.   # Adult  Hypothyroidism-continue levothyroxine  200 mcg daily    # MPN- continue Hydroxyurea  1000 mg daily.   # History of Gout- continue Allopurinol  300 mg daily.    # BPH Without Urinary Symptoms- continue Flomax  0.4 mg daily and Finasteride  5 mg daily.     # History of Dementia- continue fall precautions, aspiration precautions and delirium precautions with HOB elevated. Resume aricept  and namenda     # Macrocystis without Anemia- MCV is elevated at 114.5 with a normal Hg of 15.6. Likely secondary to MPN, Pending B12 and Folic Acid  level.   Plan of care discussed with pt, pt's family, intensivist (Dr. Sherrell)   Subjective  [name] [name] is a 83 y.o. White or Caucasian [1] male admitted for acute respiratory failure with  hypoxia. Pt is accompanied by wife and brother. He notes shortness of breath at rest. Wife states that he has been having worsening lower extremity edema.   Objective  Physical Exam: Temp:  [97.8 F (36.6 C)-99.6 F (37.6 C)]  Heart Rate:  [62-87]  Resp:  [24-48]  BP: (103-146)/(50-90)  SpO2:  [80 %-99 %]  O2 Flow Rate (L/min):  [2 L/min-6 L/min]  FiO2:  [40 %-80 %]   Intake/Output Summary (Last 24 hours) at 04/14/2024 1254 Last data filed at 04/14/2024 0800 Gross per 24 hour  Intake 340 ml  Output --  Net 340 ml   Wt Readings from Last 1 Encounters:  04/14/24 105.9 kg (233 lb  7.5 oz)     Diet: Regular  Percent Meals Eaten (%): 0 %   General: Well developed, well nourished White or Caucasian [1] male resting comfortably in bed, in no acute distress.  Lymphatic: No cervical, axillary, inguinal lymphadenopathy.  Skin: No rashes.  HEENT: Normocephalic, atraumatic.  Pupils equally round and reactive to light & accomodation. Extraocular movements intact. Oropharynx moist & pink. Nasopharynx clear.   Neck: Supple. No meningismus, lymphadenopathy, carotid bruits, or jugular venous distention.  Chest: Decreased breath sounds with bibasilar crackles.    Heart: Normal S1S2. No rubs, gallups, murmurs.  Abdomen: Soft, non-tender, non-distended.  Normoactive bowel sounds.  No hepatosplenomegaly. No masses.   Breasts, GU, Rectal: Deferred  Extremities: No clubbing, cyanosis, edema.  Neurologic: Alert & oriented to person, place, time & situation. No focal neurologic deficits.   Labs: Recent Results (from the past 24 hours)  ECG 12 lead   Collection Time: 04/13/24  4:08 PM  Result Value Ref Range   Acquisition Device D3K    Systolic BP 114 mmHg   Diastolic BP 62 mmHg   Ventricular Rate 80 BPM   Atrial Rate 119 BPM   QRS Duration 172 ms   Q-T Interval 448 ms   QTC Calculation(Bazett) 516 ms   Calculated R Axis 256 degrees   Calculated T Axis 89 degrees   ECG Diagnosis      Atrial fibrillation with a competing junctional pacemaker Right superior axis deviation Left bundle branch block Abnormal ECG When compared with ECG of 20-Jan-2018 08:07, Previous ECG has undetermined rhythm, needs review Left bundle branch block has replaced Nonspecific intraventricular block Borderline criteria for Lateral infarct are no longer present Criteria for Inferior infarct are no longer present Brien Kung ([id number]) on 04/13/2024 5:45:34 PM certifies that he/she has reviewed the ECG tracing and confirms the independent interpretation is correct.   proBNP   Collection Time: 04/13/24  4:09 PM  Result Value Ref Range   NT-ProBNP 8,591 (H) <=1,799 pg/mL  Gen5 Cardiac Troponin T (TnT5) Baseline Series at: baseline, 1 hour, and 3 hour; Onset of symptoms: Less than 3 hours or undetermined   Collection Time: 04/13/24  4:09 PM  Result Value Ref Range   TnT-Gen5 (0hr) 55 (H) <22 ng/L  Comprehensive Metabolic Panel   Collection Time: 04/13/24  4:09 PM  Result Value Ref Range   Na 139 136 - 146 mmol/L   Potassium 5.2 3.7 - 5.4 mmol/L   Cl 98 97 - 108 mmol/L   CO2 28 20 - 32 mmol/L   AGAP 13 7 - 16 mmol/L   Glucose 202 (H) 65 - 99 mg/dL   BUN 32 (H) 8 -  27 mg/dL   Creatinine 8.39 (H) 9.23 - 1.27 mg/dL   Ca 9.8 8.6 - 89.7 mg/dL   ALK PHOS 865 25 - 839 U/L   T Bili 2.2 (H) 0.0 - 1.2 mg/dL   Total Protein 8.0 6.0 - 8.5 gm/dL   Alb 4.3 3.5 - 4.7 gm/dL   GLOBULIN 3.7 1.5 - 4.5 gm/dL   ALBUMIN/GLOBULIN RATIO 1.2 1.1 - 2.5   BUN/CREAT RATIO 20.0 11.0 - 26.0   ALT 23 0 - 55 U/L   AST 74 (H) 0 - 40 U/L   eGFR 42 (L) >=60 mL/min/1.72m2  CBC And Differential   Collection Time: 04/13/24  4:09 PM  Result Value Ref Range   WBC 30.2 (H) 4.0 - 10.5 thou/mcL   RBC 4.22 (L) 4.63 - 6.08 million/mcL  HGB 15.6 13.7 - 17.5 gm/dL   HCT 51.6 59.8 - 48.9 %   MCV 114.5 (H) 79.0 - 92.2 fL   MCH 37.0 (H) 25.7 - 32.2 pg   MCHC 32.3 32.3 - 36.5 gm/dL   Plt Ct 572 (H) 849 - 400 thou/mcL   RDW SD 68.0 (H) 35.1 - 46.3 fL   MPV 11.3 9.4 - 12.4 fL   NRBC% 0.0 0.0 - 0.2 /100WBC   Absolute NRBC Count 0.00 0.00 - 0.01 thou/mcL   NEUTROPHIL % 91.0 %   LYMPHOCYTE % 3.6 %   MONOCYTE % 2.7 %   Eosinophil % 0.2 %   BASOPHIL % 0.5 %   IG% 2.0 %   ABSOLUTE NEUTROPHIL COUNT 27.43 (H) 1.50 - 7.50 thou/mcL   ABSOLUTE LYMPHOCYTE COUNT 1.10 1.00 - 4.50 thou/mcL   Absolute Monocyte Count 0.82 (H) 0.10 - 0.80 thou/mcL   Absolute Eosinophil Count 0.07 0.00 - 0.50 thou/mcL   Absolute Basophil Count 0.15 0.00 - 0.20 thou/mcL   Absolute Immature Granulocyte Count 0.61 (H) 0.00 - 0.03 thou/mcL   Blood Gas, Arterial on current oxygen  level   Collection Time: 04/13/24  4:27 PM  Result Value Ref Range   PH ARTERIAL 7.400 7.350 - 7.450   PCO2 48.0 (H) 35.0 - 45.0 mmHg   PO2 ARTERIAL 103.0 (H) 80.0 - 100.0 mmHg   HCO3 30 (H) 21 - 28 mmol/L   Base Excess 4 (H) -2 - 2 mmol/L   OXYGEN  SATURATION MEASURED ART 98.7 %   FRACTION OF INSPIRED OXYGEN  50 %   VENTILATION MODE ARTERIAL bipap    TOTAL RATE ART 28    PE/CP/EP 5    PSV/IPAP 10    ALLENS TEST pos    RESPIRATORY CARE NUMBER OF STICKS BCH 1    SITE rr   ECG 12 lead   Collection Time: 04/13/24  4:55 PM  Result Value  Ref Range   Acquisition Device D3K    Systolic BP 114 mmHg   Diastolic BP 62 mmHg   Ventricular Rate 80 BPM   Atrial Rate 71 BPM   QRS Duration 172 ms   Q-T Interval 452 ms   QTC Calculation(Bazett) 521 ms   Calculated R Axis 246 degrees   Calculated T Axis 96 degrees   ECG Diagnosis      Atrial fibrillation with a competing junctional pacemaker Right superior axis deviation Left bundle branch block Abnormal ECG When compared with ECG of 13-Apr-2024 16:08, No significant change was found Brien Kung ([id number]) on 04/13/2024 8:01:35 PM certifies that he/she has reviewed the ECG tracing and confirms the independent interpretation is correct.   Gen5 Cardiac Troponin T (TnT5) 1H   Collection Time: 04/13/24  5:01 PM  Result Value Ref Range   TnT-Gen5 (1hr) 67 (H) <22 ng/L   Delta 1 Hour 12 (H) <5 ng/L  Gen5 Cardiac Troponin T (TnT5) 3H   Collection Time: 04/13/24  6:41 PM  Result Value Ref Range   TnT-Gen5 (3hr) 75 (H) <22 ng/L   Delta 3 Hour 20 (H) <7 ng/L  Lactic Acid - Yes reflex 2 hour   Collection Time: 04/13/24  6:41 PM  Result Value Ref Range   Lactic Acid 1.6 0.5 - 2.0 mmol/L  Hemoglobin A1c   Collection Time: 04/13/24  6:41 PM  Result Value Ref Range   Hemoglobin A1c 6.8 (H) 4.8 - 5.6 %  Procalcitonin level at next blood draw if not already done   Collection Time:  04/13/24  6:42 PM  Result Value Ref Range   Procalcitonin 0.10 (H) 0.00 - 0.08 ng/mL  ECG 12 lead   Collection Time: 04/13/24  7:01 PM  Result Value Ref Range   Acquisition Device D3K    Systolic BP 103 mmHg   Diastolic BP 64 mmHg   Ventricular Rate 79 BPM   Atrial Rate 89 BPM   QRS Duration 166 ms   Q-T Interval 442 ms   QTC Calculation(Bazett) 506 ms   Calculated R Axis 257 degrees   Calculated T Axis 105 degrees   ECG Diagnosis      Atrial fibrillation with intermittently paced beats Right superior axis deviation Left bundle branch block Abnormal ECG When compared with ECG of 13-Apr-2024  16:55, No significant change was found Brien Kung ([id number]) on 04/13/2024 8:05:38 PM certifies that he/she has reviewed the ECG tracing and confirms the independent interpretation is correct.   POCT Glucose   Collection Time: 04/13/24  8:10 PM  Result Value Ref Range   Glucose, POC 215 (H) 70 - 99 mg/dL   OPERATOR ID [id number]    INSTRUMENT ID [id number]   Blood Gas, Arterial on current oxygen  level   Collection Time: 04/13/24  8:16 PM  Result Value Ref Range   PH ARTERIAL 7.360 7.350 - 7.450   PCO2 49.7 (H) 35.0 - 45.0 mmHg   PO2 ARTERIAL 68.4 (L) 80.0 - 100.0 mmHg   HCO3 28 21 - 28 mmol/L   Base Excess 2 -2 - 2 mmol/L   OXYGEN  SATURATION MEASURED ART 94.3 %   PATIENT TEMPERATURE 37.0 Deg   FRACTION OF INSPIRED OXYGEN  50 %   VENTILATION MODE ARTERIAL BIPAP    TOTAL RATE ART 24    PE/CP/EP 5    PSV/IPAP 15    ALLENS TEST P    RESPIRATORY CARE NUMBER OF STICKS BCH 1    SITE RR   ECG 12-Lead   Collection Time: 04/13/24  8:39 PM  Result Value Ref Range   Acquisition Device [id number]    Ventricular Rate 65 BPM   QRS Duration 166 ms   Q-T Interval 478 ms   QTC Calculation(Bazett) 497 ms   Calculated R Axis -71 degrees   Calculated T Axis 113 degrees   ECG Diagnosis      Atrial fibrillation with occasional ventricular-paced complexes Left axis deviation Nonspecific intraventricular block Minimal voltage criteria for LVH, may be normal variant ( Cornell product ) Abnormal ECG No previous ECGs available No significant change  Chales, Jordan ([id number]) on 04/14/2024 8:31:54 AM certifies that he/she has reviewed the ECG tracing and confirms the independent interpretation is correct.   POCT Glucose   Collection Time: 04/13/24 11:18 PM  Result Value Ref Range   Glucose, POC 236 (H) 70 - 99 mg/dL   OPERATOR ID [id number]    INSTRUMENT ID [id number]   CBC   Collection Time: 04/14/24 12:02 AM  Result Value Ref Range   WBC 37.0 (H) 4.0 - 10.5 thou/mcL   RBC 4.40 (L) 4.63 - 6.08  million/mcL   HGB 16.2 13.7 - 17.5 gm/dL   HCT 48.7 (H) 59.8 - 48.9 %   MCV 116.4 (H) 79.0 - 92.2 fL   MCH 36.8 (H) 25.7 - 32.2 pg   MCHC 31.6 (L) 32.3 - 36.5 gm/dL   Plt Ct 475 (H) 849 - 400 thou/mcL   RDW SD 70.1 (H) 35.1 - 46.3 fL   MPV 11.6 9.4 - 12.4 fL   NRBC%  0.1 0.0 - 0.2 /100WBC   Absolute NRBC Count 0.02 (H) 0.00 - 0.01 thou/mcL  Basic Metabolic Panel   Collection Time: 04/14/24 12:02 AM  Result Value Ref Range   Na 140 136 - 146 mmol/L   Potassium 4.8 3.7 - 5.4 mmol/L   Cl 97 97 - 108 mmol/L   CO2 27 20 - 32 mmol/L   AGAP 16 7 - 16 mmol/L   Glucose 234 (H) 65 - 99 mg/dL   BUN 38 (H) 8 - 27 mg/dL   Creatinine 7.63 (H) 9.23 - 1.27 mg/dL   Ca 9.8 8.6 - 89.7 mg/dL   BUN/CREAT RATIO 83.8 11.0 - 26.0   eGFR 27 (L) >=60 mL/min/1.59m2  Magnesium    Collection Time: 04/14/24 12:02 AM  Result Value Ref Range   Mg 1.6 1.6 - 2.6 mg/dL  Prognostic Gen5 Cardiac Troponin T   Collection Time: 04/14/24 12:02 AM  Result Value Ref Range   Prognostic Gen5 Cardiac Troponin T 82 (H) <22 ng/L  Prognostic Gen5 Cardiac Troponin T   Collection Time: 04/14/24  2:02 AM  Result Value Ref Range   Prognostic Gen5 Cardiac Troponin T 81 (H) <22 ng/L  Prognostic Gen5 Cardiac Troponin T   Collection Time: 04/14/24  4:56 AM  Result Value Ref Range   Prognostic Gen5 Cardiac Troponin T 79 (H) <22 ng/L  POCT Glucose   Collection Time: 04/14/24  5:38 AM  Result Value Ref Range   Glucose, POC 176 (H) 70 - 99 mg/dL   OPERATOR ID [id number]    INSTRUMENT ID [id number]   Echocardiogram Complete WO Enhancing Agent   Collection Time: 04/14/24  8:15 AM  Result Value Ref Range   LA M-L Dimension (A4C) 7.710 cm   LA S-I Dimension (A2C) 6.790 cm   LA Area Sys (A2C) 33.200 cm2   LA Area Sys (A4C) 29.300 cm2   LA ESV (A2C) 116.000 mL   LA ESV (A4C) 101.000 mL   LA A-P Dimension 5.900 cm   Left Atrium Dimension 2D 5.900 cm   LA ESV Index (A4C) 46.800 ml/m2   LA Volume Index (BP) 53.2 mL/m2   LA/Ao  Ratio 1.640 no units   LV Length Dias (A4C) 8.600 cm   LV Area Dias (A4C) 36.700 cm2   LVEDV 143.0 ml   LV Diastolic Volume (BP) 129.0 mL   LV Length Sys (A4C) 7.370 cm   LV Systolic Volume (BP) 58.200 mL   LVES V 65.9 ml   IVSd 1.4 0.8 - 1.4 cm   IVS 1.500 cm   LVIDd 5.400 6.01 - 8.34 cm   LVIDD 5.40 cm   LVIDs 4.100 3.50 - 5.31 cm   Left Ventricular Outflow Tract Cross Section Area 22.000 mm   LVOT Diameter 2.200 cm   LVOT Mean Gradient 1.0 mmHg   LVOT Peak VTI 13.200 cm   LVOT Mean Vel 47.100 cm/s   LVOT Peak Velocity 0.707 m/s   LVOT Peak Gradient 2.000 mmHg   LVPWd 1.300 0.75 - 1.40 cm   LVPWD 1.400 cm   LV Peak Diastolic Tissue Velocity During (A Sys Lat) 5.920 cm/s   MV E' Lateral Tissue Vel 5.920 cm/s   LV Peak Diastolic Tissue Velocity During (A Sys Med) 4.620 cm/s   MV E' Tissue Velocity Lateral 4.620 cm/s   Global Strain -9.500 %   A4C EF 55 %   Left Ventricular EF by 2-D Biplane by Method of Disks 56.100 %   Left Ventricular EF  by Brennan Method 47.400 %   E/E' Lateral Ratio 27.900 no units   E/E' Ratio 35.70 no units   LVFS 24.100 %   Interventricular Septum/Left Ventricular PWD by 2D 1.070 no units   LVOT Area 3.80 cm2   LV Stroke Volume 88.100 mL   LVOT stroke volume 50.000 cm3   LV Stroke Volume 66.800 mL   RA Major 7.400 cm   RA Volume 90.200 mL   AR PHT 856.000 ms   AoV Regurgitant PHT 655.000 ms   Aortic Regurgitation Peak Velocity 297.000 cm/s   AV VMAX 3.730 m/s   AoV Peak Velocity 3.270 m/s   AoV Peak Gradient 43.000 mmHg   Aortic valve mean velocity 1.950 m/s   Aortic valve mean velocity 2.180 m/s   Aortic valve mean velocity 1.980 m/s   AoV Mean Gradient 22.000 mmHg   Aortic valve velocity time integral 0.540 m   Aortic valve mean velocity 2.040 m/s   AVA (VTI) 0.930 cm2   AoV Valve Area 0.82 cm2   AVA/BSA 0.430 no units   AV index (prosthetic) 0.220 no units   Aortic root 3.600 cm   Aortic annulus 3.600 cm   IVC 2.90 cm   MR VTI  106.000 cm   Mitral valve mean inflow velocity 3.200 m/s   MR Vel 401.000 cm/s   MR Vel 400.000 cm/s   MR Vel 439.000 cm/s   MR Vel 391.000 cm/s   MR Vel 422.000 cm/s   Mitral Regurgitant Peak Velocity 411.000 cm/s   MV DT 197.000 ms   E wave deceleration time 197.000 msec   MV A Velocity 96.400 cm/s   MV Peak A Vel 0.964 m/s   MV E Velocity 165.000 cm/s   MV Peak E Vel 165.000 cm/s   MV E/A 1.700 no units   PR End Diastolic Velocity 35.700 cm/s   PV PEAK VELOCITY 35.700 cm/s   PV peak gradient 1.000 mmHg   TAPSE 0.966 cm   TR Peak Velocity 2.410 m/s   TR Peak Gradient 23.000 mmHg   TV Peak Velocity 329.000 cm/s   TV Mean Vel 329.000 cm/s   TV peak gradient 43.000 mmHg   TV mean gradient 28.000 mmHg   Tricuspid Valve Regurgitation Velocity Time Interval 108.000 cm   TV Mean Gradient 28.000 mmHg   LV Mass 345.000 g   Patient Height 67.008    Patient Weight 3,735.47    Blood Pressure Monitoring 133/69    Pulse 80    Resp Care Set Rate Venous 26    Calculated BMI 36.6    BSA 2.24 m2   Pulse Ox 97    ZLVIDS -0.40    ZIVSD 1.35    ZLVPWD 1.26    ZLVIDD -2.71   POCT Glucose   Collection Time: 04/14/24 11:39 AM  Result Value Ref Range   Glucose, POC 224 (H) 70 - 99 mg/dL   OPERATOR ID [id number]    INSTRUMENT ID [id number]    Recent Labs    Units 04/14/24 1139 04/14/24 0538 04/14/24 0002 04/13/24 2318 04/13/24 2010 04/13/24 1609  GLUCOSE mg/dL 775* 823* 765* 763* 784* 202*    Imaging: CT Chest WO Contrast Result Date: 04/13/2024 CT CHEST WITHOUT INTRAVENOUS CONTRAST: 04/13/2024 8:24 PM HISTORY: 83 years  old Male with Shortness of breath. COMPARISON: None available TECHNIQUE: Serial axial images were obtained from above the thoracic inlet to the upper abdomen without  intravenous contrast administered. Sagittal and coronal reconstructions were also obtained  and reviewed. This exam was performed according to our departmental dose-optimization program, which  includes automated exposure control, adjustment of the mA and/or KV according to the patient's size and/or use of iterative reconstruction technique. FINDINGS: Evaluation of the mediastinal and vascular structures is limited by the lack of intravenous contrast. CARDIOMEDIASTINAL STRUCTURES: The heart size is enlarged. No pericardial effusion is seen. Coronary artery calcifications are seen. A left-sided pacemaker is seen. LYMPH NODES: Evaluation for hilar lymphadenopathy is limited by lack of intravenous contrast.  No significant mediastinal, supraclavicular, or axillary lymphadenopathy is seen. AORTA: The ascending aorta is at the upper limits of normal in size. There is atherosclerotic calcification seen at the aorta. PULMONARY ARTERY: The main pulmonary artery is enlarged and measures at least 4.2 cm in transverse dimension. THYROID : The thyroid  gland is unremarkable. TRACHEA: The central tracheobronchial tree is patent. PARENCHYMA: There are groundglass changes within the lower septal thickening, suggesting edema. PLEURA: There is a moderate right and small left effusion.  There is no evidence of a pneumothorax. BONES AND SOFT TISSUES: No acute osseous abnormality is seen.  Multilevel degenerative changes are seen at the thoracic spine. There are multilevel anterior osteophytes present, suggestive of DISH. UPPER ABDOMEN: Evaluation of the solid organs is limited by the lack of intravenous contrast. Cholelithiasis is seen. The visualized hepatic parenchyma is mildly low in attenuation. The spleen is enlarged, measures at least 16.1 cm in length.   IMPRESSION: There are groundglass changes with interlobular septal thickening suggesting edema. To moderate right and small left effusion. The main pulmonary artery is enlarged, which can be seen with pulmonary artery hypertension. Hepatic steatosis There is incidental cholelithiasis seen. Electronically Signed by: Rock Croft, MD on 04/13/2024 8:48 PM  XR Chest  Ap Portable Result Date: 04/13/2024 XR CHEST AP PORTABLE INDICATION: Shortness of breath COMPARISON: None Available at Time of Dictation TECHNIQUE: One view. FINDINGS: Left chest cardiac device. Median sternotomy wires. Enlarged cardiac silhouette compatible with cardiomegaly or pericardial effusion. Diffuse airspace opacities compatible with pulmonary edema versus infection. Likely small bilateral pleural effusions. Follow-up to radiographic resolution recommended. No pneumothorax.   IMPRESSION: 1.  Diffuse airspace opacities compatible with pulmonary edema versus infection. Likely small bilateral pleural effusions. Follow-up to radiographic resolution recommended. Electronically Signed by: Valma Companion, MD on 04/13/2024 4:40 PM    Time spent: 50 minutes   Balinda GORMAN Short, DO 04/14/2024 12:54 PM

## 2024-04-15 NOTE — Care Plan (Signed)
  Problem: Bleeding, Risk of Goal: Absence of active bleeding Outcome: Progressing Goal: Absence of impaired coagulation signs and symptoms Outcome: Progressing   Problem: Discharge Planning Goal: Knowledge of medical problems (What is my main problem?) Outcome: Progressing Goal: Knowledge of self care (What do I need to do when I go home?) Outcome: Progressing

## 2024-04-15 NOTE — Progress Notes (Signed)
 SBP has been persistently below 90. Will hold IV lasix  for now and start levophed to maintain MAP between 65-70.

## 2024-04-17 NOTE — Hospice (Signed)
 Patient discussed in Interdisciplinary Team meeting with Sheena Burows, MD. Patient admitted to  [facility] level of care to address uncontrolled pain or symptoms not managed in previous setting and requirement for skilled nursing assessment of non-verbal signs of pain/discomfort that requires frequent observation and updates/revisions to care plan. Effectiveness of symptom management will continuously be re-evaluated to achieve optimal comfort and determine plan for discharge.  Over the past 24 hours, symptoms of pain and respiratory distress remain uncontrolled and cannot be managed in any other setting. Patient unable to self-report and appears visibly comfortable at time of SNV with no signs or symptoms of paid or distress.  Medications reviewed this shift. Patient is requiring frequent RN and MD intervention and medication adjustments medication: PRN IV Fentanyl  12.5 mcg available q 1 hour changed to PRN IV or SQ Morphine  1 mg available q 2hours given x 6 over last 24 hours with good effect to obtain patient/family goal of comfort.  Medications, purpose, expected outcome, and side effects explained to patient/family and agree with current plan of care.  Patient is Bedbound, no oral intake. Fall precautions remain in place with bed in the low position, call bell within reach, and requires frequent monitoring.  Discharge planning: Continue to monitor symptoms(s) and titrate medications to alleviate symptom distress.  Discharge plan is ongoing. Discharge plan coordinated with Facility RN, family and MD.

## 2024-04-17 NOTE — Progress Notes (Signed)
 [facility] Progress Note 04/17/2024  Hospital Day 1  Assessement   Active Hospital Problems   *Acute respiratory failure with hypoxia (*)     Acute renal failure     Hypotension    Plan  Continue prn morphine  for pain and prn ativan for anxiety Currently pt is resting well.  Palliative care to see tomorrow.   Plan of care discussed with pt's wife and nursing staf  Subjective  [name] [name] is a 83 y.o. White or Caucasian [1] male admitted for acute respiratory failure with hypoxia and acute renal failure. He is not making much urine. Wife is at bedside and states that he is resting well and not working to breathe.   Objective  Physical Exam: Temp:  [97.8 F (36.6 C)]  Heart Rate:  [87-93]  Resp:  [12-45]  BP: (81-108)/(49-67)  SpO2:  [86 %-95 %]  O2 Flow Rate (L/min):  [2 L/min-5 L/min]   Intake/Output Summary (Last 24 hours) at 04/17/2024 1316 Last data filed at 04/17/2024 1100 Gross per 24 hour  Intake 0 ml  Output 207 ml  Net -207 ml   Wt Readings from Last 1 Encounters:  04/16/24 102.8 kg (226 lb 10.1 oz)     Diet:    Percent Meals Eaten (%): 0 %   General: Elderly, chronically debilitated White or Caucasian [1] male resting comfortably in bed, in no acute distress.  Lymphatic: No cervical, axillary, inguinal lymphadenopathy.  Skin: No rashes.  HEENT: Normocephalic, atraumatic.  Pupils equally round and reactive to light & accomodation. Extraocular movements intact. Oropharynx moist & pink. Nasopharynx clear.   Neck: Supple. No meningismus, lymphadenopathy, carotid bruits, or jugular venous distention.  Chest: Decreased breath sounds.   Heart: S1S2, ireg irreg + murmur   Abdomen: Soft, non-tender, non-distended.  Normoactive bowel sounds.  No hepatosplenomegaly. No masses.   Breasts, GU, Rectal: Deferred  Extremities: No clubbing, cyanosis, edema.  Neurologic: Lethargic. No focal neurologic  deficits. Moves all extremities    Labs: No results found for this or any previous visit (from the past 24 hours). Recent Labs    Units 04/16/24 1135 04/16/24 0544 04/16/24 0216 04/16/24 0022 04/15/24 2011 04/15/24 1724  GLUCOSE mg/dL 746* 727* 676* 703* 699* 363*    Imaging: No results found.   Time spent: 35 minutes   Balinda GORMAN Short, DO 04/17/2024 1:16 PM

## 2024-04-18 ENCOUNTER — Encounter: Admitting: Student

## 2024-04-26 ENCOUNTER — Telehealth: Payer: Self-pay

## 2024-04-26 NOTE — Telephone Encounter (Signed)
 The patient is deceased.  Will route to [facility] to follow-up with the family as he had a defibrillator.

## 2024-05-02 ENCOUNTER — Ambulatory Visit: Admitting: Cardiology

## 2024-05-10 ENCOUNTER — Ambulatory Visit: Admitting: Physician Assistant

## 2024-05-10 NOTE — Telephone Encounter (Signed)
 Family aware they can throw monitor away

## 2024-05-19 NOTE — Nursing Note (Signed)
 Record of Death  MRN: [id number]    Name: [name] [name]    DOB: 15-Feb-1941     SSN: [id number]  Date of Death: May 08, 2024             Time of Death: 0442  Discharge Location/Unit:: [facility] ICU  Death Certificate: Who will sign death certificate?: Balinda Short MD Signer's Phone/Contact number: [phone]  Contact Notification: Name:: [name] Pleasure Contact Person Relationship to Patient: Spouse Contact Person Phone Number: [phone]  Special Handling: Special Handling: None    Belongings: Belongings/Valuables List: All personal belongings; clothing, pillow, phone Belongings/Valuables Disposition: Given to other (Comment) Alston Pleasure)  Disposition: Release of Body/Permission & Receipt of personal effects form completed and signed?: Yes Disposition: Morgue Body Hold: No    Candidate for donation: [employer] Referral Number: [id number] [employer] OPO Representative's Name: Katelyn ([employer]) Notified by:: Jenkins Blush RN    Is patient a potential donor?: No              Medical Examiner/Coroner Case: Reportable Situations:: NA           Autopsy (non-Me/Coroner):  Autopsy Requested (Non-ME/Coroner Cases): Not requested                          Funeral Home/Cremation Services: Funeral Home/Cremation Service Name:: [employer] [facility] [employer] Notified: Yes Funeral Home/Cremation Service phone number: [phone]   Notifications: Notifications: Nursing Supervisor/Manager, Primary Nurse, Public Safety/Security     Other caregivers present: RN

## 2024-05-19 NOTE — Nursing Note (Signed)
 Wife and brother of pt at bedside and present at TOD; TOD [phone]

## 2024-05-19 NOTE — Nursing Note (Signed)
 Family left with all belongings

## 2024-05-19 DEATH — deceased

## 2024-05-30 ENCOUNTER — Ambulatory Visit: Admitting: Cardiology

## 2024-06-30 ENCOUNTER — Encounter

## 2024-07-04 ENCOUNTER — Encounter

## 2024-08-08 ENCOUNTER — Inpatient Hospital Stay

## 2024-08-08 ENCOUNTER — Inpatient Hospital Stay: Admitting: Hematology

## 2024-09-19 ENCOUNTER — Ambulatory Visit: Admitting: Adult Health

## 2024-09-29 ENCOUNTER — Encounter

## 2024-10-03 ENCOUNTER — Encounter

## 2024-12-29 ENCOUNTER — Encounter

## 2025-01-02 ENCOUNTER — Encounter

## 2025-04-03 ENCOUNTER — Encounter
